# Patient Record
Sex: Female | Born: 1940
Health system: Southern US, Community
[De-identification: ages and names within clinical notes are randomized; demographics above are authoritative.]

## PROBLEM LIST (undated history)

## (undated) DIAGNOSIS — M1990 Unspecified osteoarthritis, unspecified site: Secondary | ICD-10-CM

## (undated) DIAGNOSIS — Z9289 Personal history of other medical treatment: Secondary | ICD-10-CM

## (undated) DIAGNOSIS — K859 Acute pancreatitis without necrosis or infection, unspecified: Secondary | ICD-10-CM

## (undated) DIAGNOSIS — I10 Essential (primary) hypertension: Secondary | ICD-10-CM

## (undated) DIAGNOSIS — I4891 Unspecified atrial fibrillation: Secondary | ICD-10-CM

## (undated) DIAGNOSIS — I6529 Occlusion and stenosis of unspecified carotid artery: Secondary | ICD-10-CM

## (undated) DIAGNOSIS — I350 Nonrheumatic aortic (valve) stenosis: Secondary | ICD-10-CM

## (undated) DIAGNOSIS — I2510 Atherosclerotic heart disease of native coronary artery without angina pectoris: Secondary | ICD-10-CM

## (undated) DIAGNOSIS — C9110 Chronic lymphocytic leukemia of B-cell type not having achieved remission: Secondary | ICD-10-CM

## (undated) DIAGNOSIS — I739 Peripheral vascular disease, unspecified: Secondary | ICD-10-CM

## (undated) DIAGNOSIS — D696 Thrombocytopenia, unspecified: Principal | ICD-10-CM

## (undated) HISTORY — DX: Occlusion and stenosis of unspecified carotid artery: I65.29

## (undated) HISTORY — DX: Personal history of other medical treatment: Z92.89

## (undated) HISTORY — DX: Atherosclerotic heart disease of native coronary artery without angina pectoris: I25.10

## (undated) HISTORY — DX: Peripheral vascular disease, unspecified: I73.9

## (undated) HISTORY — DX: Chronic lymphocytic leukemia of B-cell type not having achieved remission: C91.10

## (undated) HISTORY — PX: CHOLECYSTECTOMY: SHX55

## (undated) HISTORY — PX: TONSILLECTOMY: SUR1361

## (undated) HISTORY — DX: Essential (primary) hypertension: I10

## (undated) HISTORY — DX: Unspecified atrial fibrillation: I48.91

## (undated) HISTORY — PX: APPENDECTOMY: SHX54

## (undated) HISTORY — PX: ABDOMINAL HYSTERECTOMY: SHX81

## (undated) HISTORY — DX: Thrombocytopenia, unspecified: D69.6

## (undated) HISTORY — DX: Nonrheumatic aortic (valve) stenosis: I35.0

---

## 2000-02-13 ENCOUNTER — Encounter: Payer: Self-pay | Admitting: Emergency Medicine

## 2000-02-13 ENCOUNTER — Encounter (INDEPENDENT_AMBULATORY_CARE_PROVIDER_SITE_OTHER): Payer: Self-pay

## 2000-02-13 ENCOUNTER — Inpatient Hospital Stay (HOSPITAL_COMMUNITY): Admission: EM | Admit: 2000-02-13 | Discharge: 2000-02-21 | Payer: Self-pay | Admitting: Emergency Medicine

## 2000-02-14 ENCOUNTER — Encounter: Payer: Self-pay | Admitting: Internal Medicine

## 2000-02-17 ENCOUNTER — Encounter: Payer: Self-pay | Admitting: General Surgery

## 2000-02-19 ENCOUNTER — Encounter: Payer: Self-pay | Admitting: General Surgery

## 2000-02-20 ENCOUNTER — Encounter: Payer: Self-pay | Admitting: Cardiology

## 2006-08-24 ENCOUNTER — Ambulatory Visit: Payer: Self-pay | Admitting: Hematology and Oncology

## 2006-09-16 ENCOUNTER — Other Ambulatory Visit: Admission: RE | Admit: 2006-09-16 | Discharge: 2006-09-16 | Payer: Self-pay | Admitting: Hematology and Oncology

## 2006-09-16 ENCOUNTER — Encounter: Payer: Self-pay | Admitting: Hematology and Oncology

## 2006-09-16 LAB — CBC WITH DIFFERENTIAL/PLATELET
Basophils Absolute: 0 10*3/uL (ref 0.0–0.1)
Eosinophils Absolute: 0.3 10*3/uL (ref 0.0–0.5)
HGB: 13.2 g/dL (ref 11.6–15.9)
LYMPH%: 71.8 % — ABNORMAL HIGH (ref 14.0–48.0)
MCV: 97.4 fL (ref 81.0–101.0)
MONO%: 3.7 % (ref 0.0–13.0)
NEUT#: 2.2 10*3/uL (ref 1.5–6.5)
NEUT%: 21.8 % — ABNORMAL LOW (ref 39.6–76.8)
Platelets: 121 10*3/uL — ABNORMAL LOW (ref 145–400)
RBC: 3.8 10*6/uL (ref 3.70–5.32)

## 2006-09-18 LAB — COMPREHENSIVE METABOLIC PANEL
BUN: 12 mg/dL (ref 6–23)
CO2: 30 mEq/L (ref 19–32)
Calcium: 10.4 mg/dL (ref 8.4–10.5)
Chloride: 96 mEq/L (ref 96–112)
Creatinine, Ser: 0.77 mg/dL (ref 0.40–1.20)
Glucose, Bld: 117 mg/dL — ABNORMAL HIGH (ref 70–99)

## 2006-09-18 LAB — PROTEIN ELECTROPHORESIS, SERUM
Alpha-2-Globulin: 13.1 % — ABNORMAL HIGH (ref 7.1–11.8)
Beta 2: 3.5 % (ref 3.2–6.5)
Beta Globulin: 5.9 % (ref 4.7–7.2)
Gamma Globulin: 8.3 % — ABNORMAL LOW (ref 11.1–18.8)

## 2010-01-28 ENCOUNTER — Encounter: Payer: Self-pay | Admitting: Hematology and Oncology

## 2010-05-20 ENCOUNTER — Other Ambulatory Visit: Payer: Self-pay | Admitting: Dermatology

## 2010-05-24 NOTE — Consult Note (Signed)
[place]  Patient:    Nicole Mercado, KOCHAN                          MRN: [id number] Proc. Date: 02/14/00 Adm. Date:  [id number] Attending:  Drema Halon CC:         Vale Haven. Andrey Campanile, M.D.  Hedwig Morton. Juanda Chance, M.D. [facility]   Consultation Report  REASON FOR CONSULTATION:  Gallstones and pancreatitis.  HISTORY OF PRESENT ILLNESS:  This is a 70 year old white female who was in her usual state of good health until yesterday at 5 oclock p.m.  At that time she had rather acute onset of epigastric pain, left upper quadrant pain, right upper quadrant pain, and back pain.  The pain was progressive.  She became nauseated and vomited at least three times.  She was having heartburn and took Tums, but without relief.  She ultimately came to the [facility] and was evaluated and found to have acute pancreatitis as well as gallstones.  She was seen by Dr. Karma Ganja and was admitted for management.  She has been on IV fluids and n.p.o. and protime pump inhibitors over the period of time.  She states that her pain is better today, but has not completely resolved.  She has not had any prior episodes like this.  No prior pancreatic or biliary tract problems.  No history of liver disease or hepatitis.  She does drink about three drinks a day, sometimes beer, wine, sometimes mixed drinks.  She is a nonsmoker.  I was asked to see her because of the need for ultimate cholecystectomy.  PAST SURGICAL HISTORY:  She has had four cesarean sections.  She has had a total abdominal hysterectomy and bilateral salpingo-oophorectomy.  She has had appendectomy, tonsillectomy at age 67.  PAST MEDICAL HISTORY:  Hypertension, postmenopausal.  CURRENT MEDICATIONS:  Accupril, estrogen patch, vitamin E.  ALLERGIES:  None known.  FAMILY HISTORY:  Mother and father died of heart attacks.  Her mother had a cholecystectomy.  SOCIAL HISTORY:  The patient is widowed,  lives alone.  Drinks about three drinks a day.  Denies tobacco.  PHYSICAL EXAMINATION:  GENERAL:  Well-developed, well-nourished white female in no distress.  VITAL SIGNS:  Afebrile.  Heart rate 82, respirations 18, normotensive.  HEENT:  Sclerae are clear.  Extraocular movements are intact.  Oropharynx: Clear.  NECK:  Supple, nontender.  No mass.  No adenopathy.  No bruit.  LUNGS:  Clear to auscultation.  HEART:  Regular rate and rhythm.  No murmur.  ABDOMEN:  Not distended.  Bowel sounds are hypoactive.  She is tender in the epigastrium, left upper quadrant, and right upper quadrant but she has no peritoneal signs.  Minimal guarding.  No rebound.  No abdominal mass.  EXTREMITIES:  No edema.  Good pulses.  NEUROLOGIC:  Grossly within normal limits.  LABORATORIES:  Hemoglobin 16.0, repeat 15.3, white blood cell count on admission 14.8 and has fallen to 12.3.  Liver enzymes were up with SGOT 630, SGPT 414 with total bilirubin 1.3.  On admission serum lipase was 5931; today it is 3414.  Ultrasound shows numerous gallstones, a thickened gallbladder wall with some pericholecystic fluid.  There is an echogenic focus thought to be in the common bile duct suggesting the possibility of a common bile duct stone but the common bile duct is not dilated.  CT scan shows moderate severe pancreatitis, specifically, the pancreas diffusely enlarged  and edematous. There are gallstones noted.  There is no free air or extraluminal air.  No abscess noted.  IMPRESSION: 1. Moderately severe acute pancreatitis.  The pancreatitis is edematous but it    is clinically improving.  Likely etiology is gallstones but I can not    exclude alcohol as a possible cause. 2. Chronic (daily) alcohol use. 3. Hypertension. 4. Status post total abdominal hysterectomy and bilateral    salpingo-oophorectomy. 5. Status post multiple cesarean sections.  PLAN: 1. As you are doing, I agree with bowel rest, n.p.o.  status, protime pump    inhibitors, careful attention to hydration. 2. We will repeat all of her laboratory work tomorrow to make sure that her    liver function tests are normalizing.  If her liver function tests go up    and/or her symptoms recur, emergent ERCP will have to be considered. 3. Assuming her pancreatitis cools off uneventfully, then cholecystectomy this    admission would be recommended. DD:  02/14/00 TD:  02/16/00 Job: [id number]

## 2010-05-24 NOTE — Op Note (Signed)
[facility]  Patient:    Nicole Mercado, Nicole Mercado                          MRN: [id number] Proc. Date: 02/17/00 Adm. Date:  [id number] Attending:  Drema Halon CC:         Vale Haven. Andrey Campanile, M.D.  Hedwig Morton. Juanda Chance, M.D. [facility]   Operative Report  PREOPERATIVE DIAGNOSES:  Acute biliary pancreatitis, cholelithiasis, possible choledocholithiasis.  POSTOPERATIVE DIAGNOSES:  Biliary pancreatitis, cholelithiasis, choledocholithiasis.  OPERATION PERFORMED:  Laparoscopic cholecystectomy with intraoperative cholangiogram.  SURGEON:  Dr. Claud Kelp.  FIRST ASSISTANT:  Dr. Francina Ames.  INDICATIONS FOR PROCEDURE:  This is a 70 year old white female who had the rather sudden onset of upper abdominal pain on February 7. This was associated with vomiting. She was evaluated in the emergency room and found to have gallstones on ultrasound. She was also found to have markedly elevated serum amylase and serum lipase. There was also a question of an echogenic focus in the common bile duct on ultrasound. CT scan showed moderately severe edematous pancreatitis. The gallbladder did not appear acutely inflamed and the common bile duct was not dilated. She was hospitalized and over the past 72 hours has become asymptomatic. Her abdominal pain is resolved and her abdominal tenderness has resolved. Her liver function tests which were somewhat elevated have almost completely normalized. She is brought to the operating room for cholecystectomy with cholangiogram.  FINDINGS:  The gallbladder was somewhat edematous but this appeared to be more of a secondary inflammation due to her pancreatitis. The pancreas was clearly swollen pushing the stomach anteriorly. There was some green fluid below the liver and above the liver. The small intestine and large intestine looked normal to inspection. The stomach and duodenum looked fine although the duodenum was a little bit adherent to  the infundibulum of the gallbladder. The cholangiogram showed three small distal common bile duct stones in the distal common bile duct. There was no obstruction with food flow of contrast in the duodenum. The bowel duct was not dilated. The liver appeared healthy otherwise.  TECHNIQUE:  Following the induction of general endotracheal anesthesia, the patients abdomen was prepped and draped in a sterile fashion. A 0.5% Marcaine with epinephrine was used as local infiltration anesthetic. A vertical incision was made at the upper rim of the umbilicus. The fascia was incised in the midline and the abdominal cavity entered under direct vision. A 10 mm Hasson trocar was inserted and secured with a pursestring suture of #0 Vicryl. Pneumoperitoneum was created. A video camera was inserted with visualization and findings as described above. A 10 mm trocar was placed in the subxiphoid region and two 5 mm trocars placed in the right mid abdomen.  The gallbladder was elevated. We took some adhesions down, gently dissected the duodenum away from the infundibulum of the gallbladder. There was acute edema in this area. We dissected out the cystic duct and cystic artery. We actually dissected out the anterior branch and the posterior branch of the cystic artery separately. These anterior and posterior branches were secured with metal clips and divided as they went onto the gallbladder wall. We created a nice window behind the cystic duct. We secured the cystic duct with the metal clip close to the gallbladder. A cholangiogram catheter was inserted into the cystic duct and a cholangiogram was obtained using the C-arm. The patient had what appeared to be three distal common bile duct stones  each 5 mm in diameter or less. These were all in the very distal common bile duct. The bile duct was not dilated and there was no obstruction and there was good flow of contrast into the duodenum. We also visualized the  intrahepatic ducts and the common hepatic duct quite nicely.  The cholangiogram catheter was removed. The cystic duct was secured with metal clips and divided. The gallbladder was dissected from its bed with electrocautery and removed through the umbilical port. The operative field was copiously irrigated. At the completion of the case, there was no bleeding and no bile leak whatsoever. The irrigation fluid returned quite clear. The trocars were removed under direct vision and there was no bleeding from the trocar sites. Pneumoperitoneum was release. The fascia and the umbilicus was closed with #0 Vicryl sutures. The skin incisions were closed with subcuticular sutures of 4-0 Vicryl and Steri-Strips. Clean bandages were placed and the patient taken to the recovery room in stable condition. Estimated blood loss was about 20 cc, complications none. Sponge, needle and instrument counts were correct. DD:  02/17/00 TD:  02/18/00 Job: [id number]

## 2010-05-24 NOTE — H&P (Signed)
[facility]  Patient:    Nicole Mercado, Nicole Mercado                          MRN: [id number] Adm. Date:  [id number] Attending:  Pamelia Hoit                         History and Physical  HISTORY OF PRESENT ILLNESS:  This 70 year old white female was well until 3 p.m. on the afternoon of admission when she had acute severe epigastric pain radiating to her back associated with nausea.  She did not throw up, nor have stool change.  She presented to the emergency room at [facility] where she was found to have a lipase of over 5000, a blood sugar of 169, and a potassium of 3.3 with elevated liver function tests.  Her CT scan showed severe pancreatitis with no definite gallstones, but perhaps some gallbladder sludge.  She was admitted for treatment and consultation.  ALLERGIES:  No known drug allergies.  MEDICATIONS:  Blood pressure medication, question Accupril.  PAST SURGICAL HISTORY: 1. Tonsillectomy and adenoidectomy. 2. Appendectomy. 3. Gravida 5, para 4, AB 1, with cesarean section x3, and an abdominal    hysterectomy for fibroids.  FAMILY HISTORY:  Father died at 29 of myocardial infarction.  Mother died at 67 of myocardial infarction.  One sister is alive and well.  Her mother did have gallstones.  SOCIAL HISTORY:  No tobacco.  The patient drinks one beer per day, probably 12 ounces or more of wine per day, and at least 2 ounces of liquor per day.  She is not working, and does not currently apparently have insurance.  She sees Dr. Katrinka Blazing in [place].  REVIEW OF SYSTEMS:  Really no significant past medical history except for high blood pressure for 4 years which is asymptomatic and apparently treated.  She has no history of high cholesterol or blood sugar problems.  She has had two bladder infections in her life.  We will again review this tomorrow.  PHYSICAL EXAMINATION:  VITAL SIGNS:  Temperature 97, pulse 72, respirations 16, blood pressure  154/78 without drop.  SKIN:  Cool and dry.  Good turgor.  No rash or edema.  HEENT:  Moist tongue.  Nose is clear.  Eyes are negative for scleral icterus.  NECK:  No neck masses or bruits.  CARDIORESPIRATORY:  Clear to auscultation and percussion.  No rales, rhonchi, or rubs.  ABDOMEN:  Soft, nontender, I do not palpate mass or liver enlargement.  She is slightly tender actually in the epigastrium.  No rebound.  RECTAL:  Guaiac negative stool.  NEUROMUSCULOSKELETAL:  No abnormalities.  The patient is alert, conversant, and appropriate.  IMPRESSION ON ADMISSION: 1. Acute pancreatitis, probably secondary to alcohol, rule out biliary    disease. 2. History of hypertension.  PLAN:  IV fluids, pain medications, GI consult in the a.m. DD:  02/13/00 TD:  02/15/00 Job: [id number]

## 2010-05-24 NOTE — Op Note (Signed)
[facility]  Patient:    Nicole Mercado, Nicole Mercado                          MRN: [id number] Proc. Date: 02/19/00 Adm. Date:  [id number] Attending:  Drema Halon CC:         Vale Haven. Andrey Campanile, M.D.  Angelia Mould. Derrell Lolling, M.D.   Operative Report  PROCEDURE:  Endoscopic retrograde cholangiopancreatography with biliary sphincterotomy and common bile duct stone extraction.  INDICATIONS FOR PROCEDURE:  Retained common bile duct stones post laparoscopic cholecystectomy.  HISTORY OF PRESENT ILLNESS:  This is a 70 year old female with a history of hypertension who was admitted to the hospital February 13, 2000 with gallstone pancreatitis. Two days ago, she underwent laparoscopic cholecystectomy. Intraoperative cholangiogram revealed multiple small retained stones. She is now for ERCP with sphincterotomy and common duct stone extraction. The nature of the procedure as well as the risks, benefits, and alternatives were discussed in detail. She understood and agreed to proceed.  PHYSICAL EXAMINATION:  GENERAL:  Well appearing female in no acute distress. She is alert and oriented.  VITAL SIGNS:  Remarkable for normal blood pressure at 120/62 with heart rate of 120 which is irregularly irregular.  LUNGS:  Clear.  HEART:  Irregularly irregular.  ABDOMEN:  Soft and nontender.  DESCRIPTION OF PROCEDURE:  After informed consent was obtained, the patient was sedated with 50 mg of Demerol and 5 mg of Versed IV. Glucagon 0.5 mg IV was given as a duodenal relaxant. Ciprofloxacin IV was continued preprocedurally. The Olympus therapeutic side viewing endoscope was passed blindly into the esophagus. The stomach revealed evidence of nasogastric tube trauma but was otherwise normal. The duodenal bulb and post bulbar duodenum were normal. The major ampulla was normal. The minor ampulla was not sought.  X-RAY FINDINGS: 1. Scout radiograph of the abdomen with the endoscope  in position revealed    surgical clips in the gallbladder fossa. No other abnormalities. 2. There was minimal filling of the pancreatic duct. 3. Complete filling of the biliary system revealed normal caliber biliary    tree post cholecystectomy. Multiple small stones were noted. These measured    3-4 mm. Approximately 4 stones were noted.  THERAPY:  With a guidewire placed in the proximal biliary tree, a large sphincterotomy was made with cutting in the 12 oclock orientation. Cutting was over the guidewire utilizing the ERBE system. The cutting cannula was exchanged for an 8 mm balloon. Three small cholesterol type stones were extracted. One small stone measuring approximately 3 mm was located in the left intrahepatic ductal system. Wire was passed beyond this and a balloon as well. Several attempts were made to extract the stone. These were unsuccessful. This was not pursued vigorously as the small stone was felt to easily pass through the sphincterotomy. Post procedure drainage of the biliary system was excellent.  IMPRESSION: 1. Choledocholithiasis status post ERCP with common duct stone extraction. 2. One small retained stone in the left intrahepatic ductal system. 3. New onset atrial fibrillation/flutter.  RECOMMENDATIONS: 1. Continue antibiotics. 2. Advance diet as tolerated. 3. The patient will be transferred to telemetry. A cardiology consultation    has been called to evaluate her supraventricular tachycardia. DD:  02/19/00 TD:  02/19/00 Job: [id number]

## 2010-05-24 NOTE — Discharge Summary (Signed)
[facility]  Patient:    Nicole Mercado, Nicole Mercado                          MRN: [id number] Adm. Date:  [id number] Disc. Date: [id number] Attending:  Brandy Hale CC:         Vale Haven. Andrey Campanile, M.D.  Hedwig Morton. Juanda Chance, M.D. [facility]  John N. Eda Keys., M.D. [facility]  Luis Abed, M.D. [facility]   Discharge Summary  FINAL DIAGNOSES:  1. Acute biliary pancreatitis, resolved.  2. Chronic cholecystitis with cholelithiasis.  3. Choledocholithiasis.  4. Paroxysmal atrial fibrillation, resolved.  5. Hypertension.  6. Status post total abdominal hysterectomy and bilateral     salpingo-oophorectomy.  OPERATIONS PERFORMED:  1. Laparoscopic cholecystectomy with intraoperative cholangiogram, February 17, 2000.  2. Endoscopic retrograde cholangiopancreatography with endoscopic     sphincterotomy and common duct stone removal, February 19, 2000.  HISTORY OF PRESENT ILLNESS:  This is a 70 year old white female who was in good health until 5:00 p.m. the day prior to admission. She developed acute onset of epigastric pain and left upper quadrant pain and right upper quadrant pain and back pain at that time. She became nauseated and vomited repeatedly. She was also having reflux symptoms. She came to the emergency room and was admitted by Dr. Margrett Rud for management of what appeared to be biliary pancreatitis. She had not had any prior episodes of similar pain. No prior history of liver disease or hepatitis. She does drink about three drinks a day, sometimes beer, sometimes wine and sometimes mixed drinks but has never had any apparent complication of her daily drinking.  PHYSICAL EXAMINATION:  GENERAL:  On the day of admission, she was found to be in mild distress.  VITAL SIGNS:  Temperature 97.0, pulse 72, respirations 16, blood pressure 154/78.  SKIN:  Cool and dry, good turgor. No edema.  HEENT:  Sclera clear.  NECK:  No mass or bruit.  HEART:  Regular  rate and rhythm.  LUNGS:  Clear to auscultation.  ABDOMEN:  Soft, minimally tender in the epigastrium, no distention, no rebound, no mass.  RECTAL:  Hemoccult negative stool.  ADMISSION DATA:  CT scan showed edematous pancreatitis. Blood work showed a lipase of over 5000. Hemoglobin 16.0, white count was 14,800. SGOT elevated at 630, SGPT 414, total bilirubin 1.3. Ultrasound showed numerous gallstones, a thickened gallbladder wall with some fluid. There was also an echogenic focus thought to be in the common bile duct suggesting the possibility of common bile duct stone but the common bile duct was not dilated.  HOSPITAL COURSE:  The patient was admitted by Dr. Margrett Rud and put at bowel rest. IV rehydration and analgesics. She was seen in consultation by Dr. Lina Sar as well as me on February 8. We both agreed that the patient certainly had biliary pancreatitis and although she did drink daily that the most likely cause of her pancreatitis was her gallstones and that ultimately she would need a cholecystectomy. We discussed whether to go ahead with ERCP at this time or to wait and since the patient seemed to be rapidly improving, we decided to wait. Over the next couple of days, her pain resolved and her liver enzymes almost completely normalized. We decided to forgo the ERCP thinking that the patient possibly may have passed her common bile duct stones. On February 11, her SGPT was down to 61 and her SGOT and alkaline  phosphatase were normal and we elected to go ahead with laparoscopic cholecystectomy.  On February 11, laparoscopic cholecystectomy was performed. She had an edematous gallbladder full of gallstones. Cholangiogram showed three small distal common bile duct stones. We completed the operation uneventfully. Dr. Yancey Flemings was asked to see her and he made plans for ERCP on February 12.  The patient did well over the next 24 to 48 hours, had no problems,  was comfortable, had minimal pain, had a low-grade fever but was able to get up and around and was voiding uneventfully.  ERCP was performed on February 19, 2000. Sphincterotomy was performed and Dr. Marina Goodell was able to get several common bile duct stones out. During the procedure, the patient went into rapid atrial fibrillation flutter and that persisted. Dr. Willa Rough was asked to see the patient. The patient spontaneously converted back to sinus rhythm. It was Dr. Cheron Every feeling that she should be started on beta blockers and she was started on Toprol. Cardiac enzymes were negative. An echocardiogram was performed and the report of the echocardiogram is pending at this time. Nevertheless, she did not have any chest pain and converted to normal sinus rhythm spontaneously and was placed on beta blockers.  The patient did well thereafter advancing in her diet and activities. She was discharged on February 15. At that time, she was afebrile tolerating a regular diet with no nausea. She was in normal sinus rhythm. Chest x-ray showed small pleural effusions felt to be probably secondary to pancreatitis and I felt these would be self limited.  DISCHARGE MEDICATIONS:  Vicodin for pain. Enteric coated aspirin one per day. Toprol XL one tablet daily. She was to hold her other antihypertensive medication until she followed up with Dr. Myrtis Ser in the office.  She was to return to the office to see me in two to three weeks. She was to follow-up with Dr. Willa Rough in two weeks regarding her hypertension and paroxysmal atrial fibrillation. She was to follow-up with Dr. Margrett Rud on a routine basis. DD:  03/02/00 TD:  03/02/00 Job: [id number]

## 2010-11-04 ENCOUNTER — Other Ambulatory Visit: Payer: Self-pay | Admitting: Family Medicine

## 2010-11-04 DIAGNOSIS — R221 Localized swelling, mass and lump, neck: Secondary | ICD-10-CM

## 2010-11-05 ENCOUNTER — Ambulatory Visit
Admission: RE | Admit: 2010-11-05 | Discharge: 2010-11-05 | Disposition: A | Discharge status: Home / Self Care | Payer: Medicare Other | Source: Ambulatory Visit | Attending: Family Medicine | Admitting: Family Medicine

## 2010-11-05 DIAGNOSIS — R221 Localized swelling, mass and lump, neck: Secondary | ICD-10-CM

## 2010-11-05 IMAGING — CT CT NECK W/ CM
4 of 6 series · 15 of 33 positions shown, 17 images · IV contrast (75CC OMNI 300)
Comparison: None.

CLINICAL DATA: 69-year-old female with left neck swelling, palpable
abnormality.

CT NECK WITH CONTRAST
TECHNIQUE: Multidetector CT imaging of the neck was performed with
intravenous contrast.
Contrast:  75 ml [GZ].

[Series 2: axial neck · axial · 0.35mm/px · z∈[+100,+235]mm · 4 of 90 slices shown, 5 images]
[im 18/90  soft-tissue]
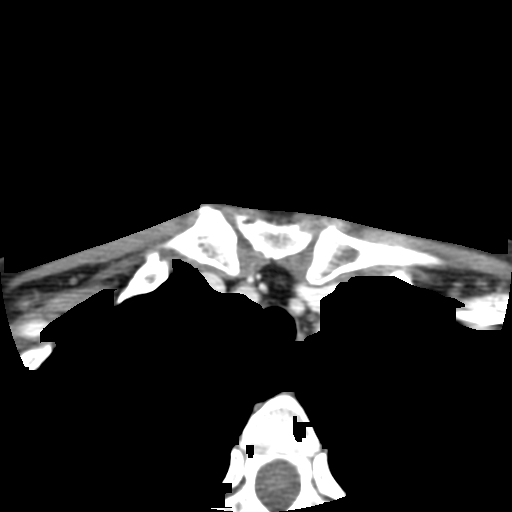
[im 18/90  bone]
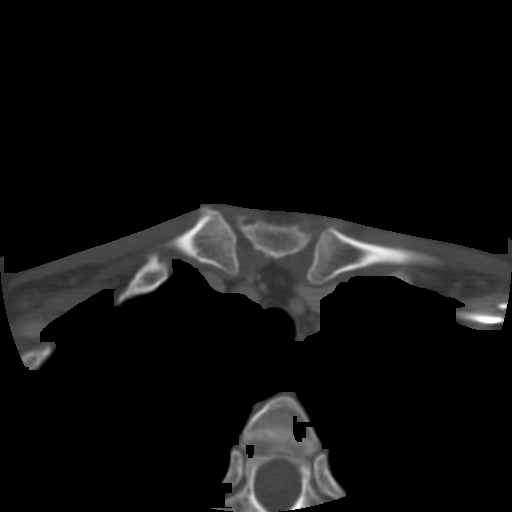
[im 36/90  bone]
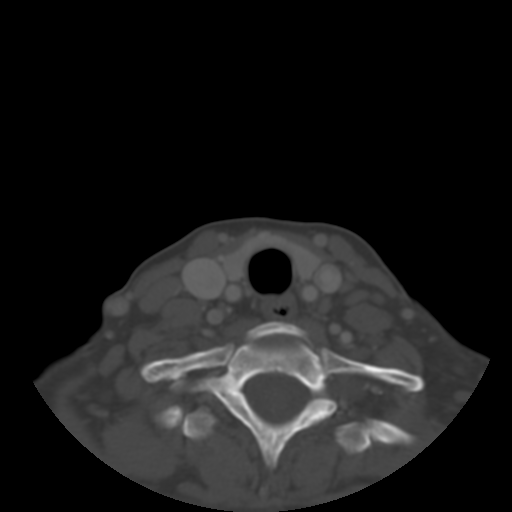
[im 54/90  bone]
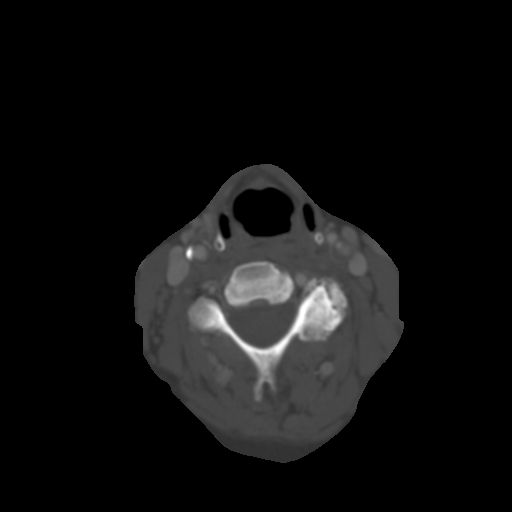
[im 72/90  bone]
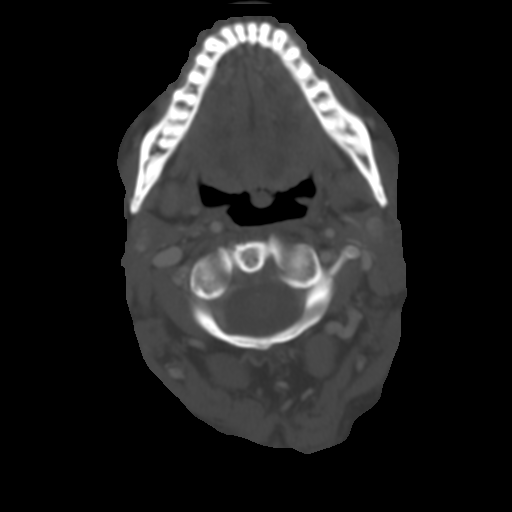

[Series 400: coronal · coronal · 0.45mm/px · 3 of 70 slices shown]
[im 14/70  bone]
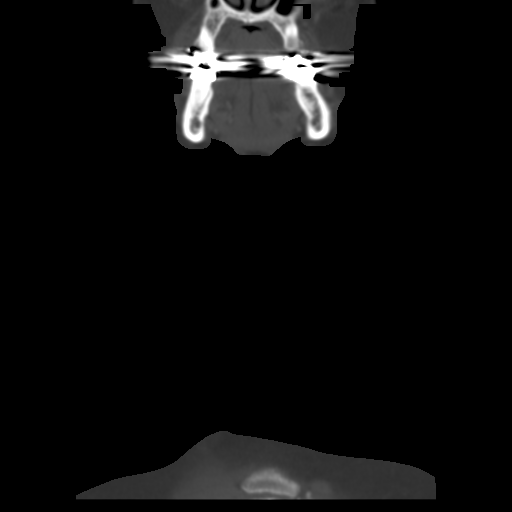
[im 28/70  bone]
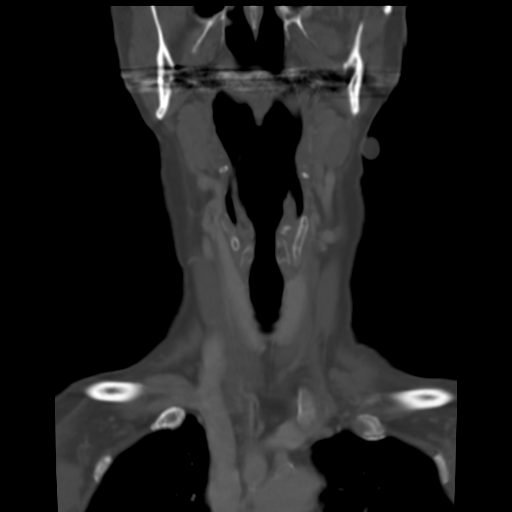
[im 42/70  bone]
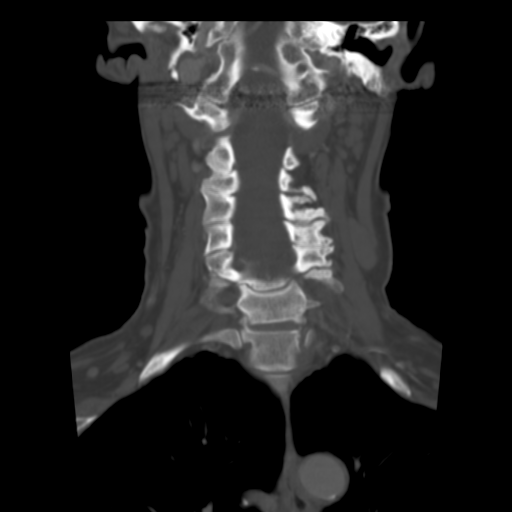

[Series 401: sagittal · sagittal · 0.45mm/px · 5 of 83 slices shown, 6 images]
[im 28/83  bone]
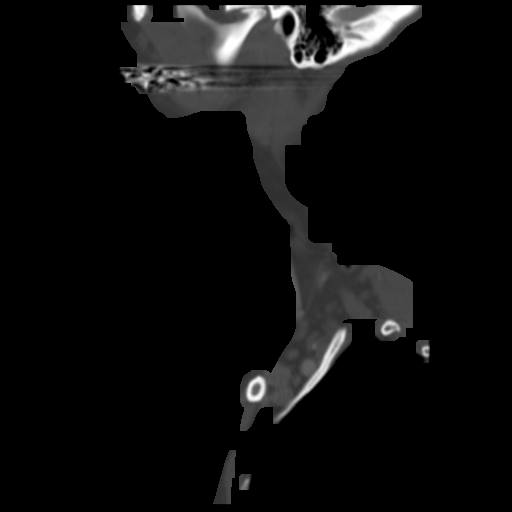
[im 35/83  bone]
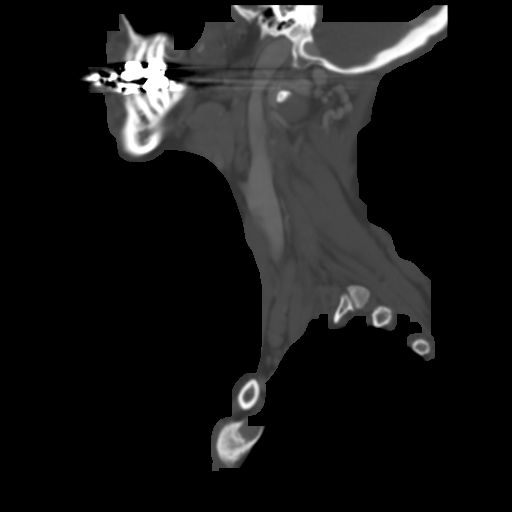
[im 42/83  soft-tissue]
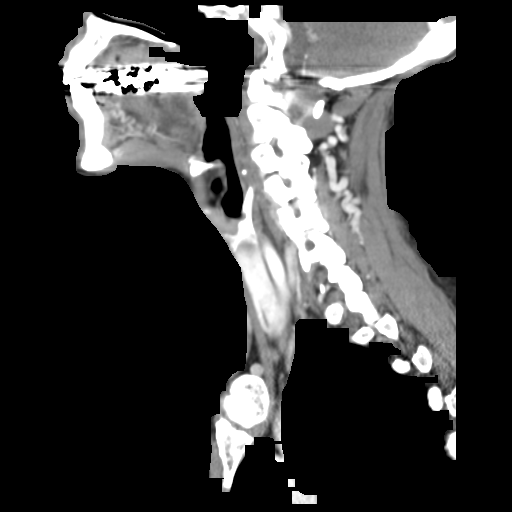
[im 42/83  bone]
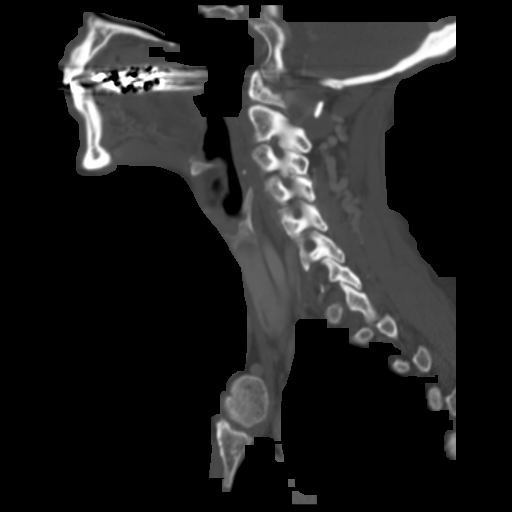
[im 48/83  bone]
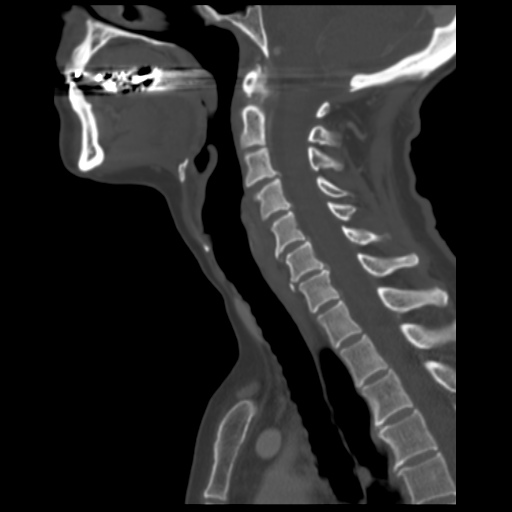
[im 55/83  bone]
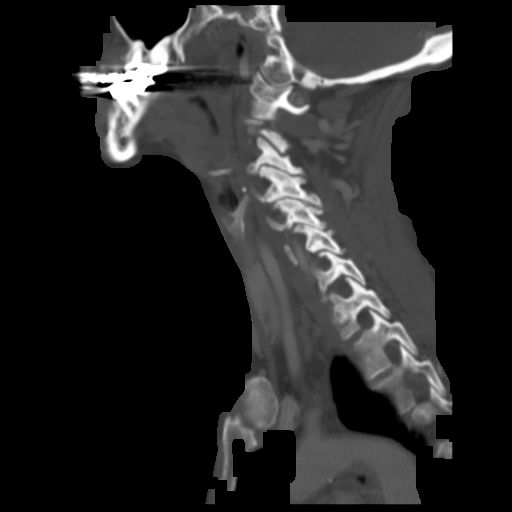

[Series 402: angled axial · axial · 0.45mm/px · z∈[+79,+175]mm · 3 of 83 slices shown]
[im 21/83  bone]
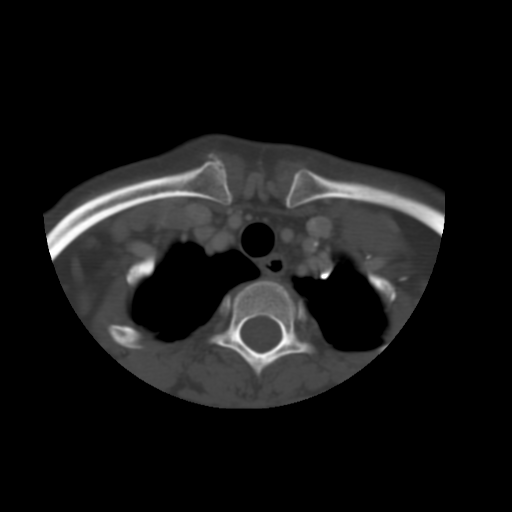
[im 42/83  bone]
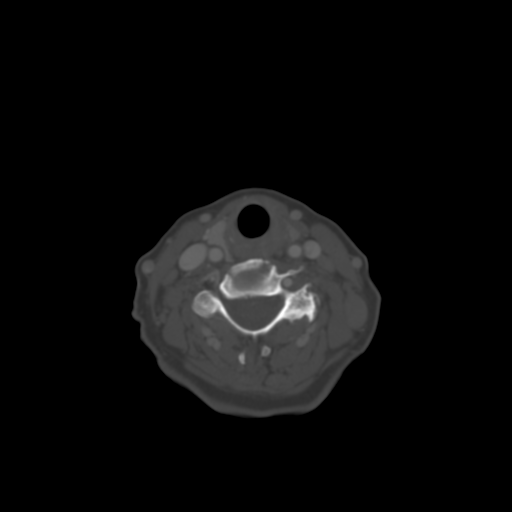
[im 62/83  bone]
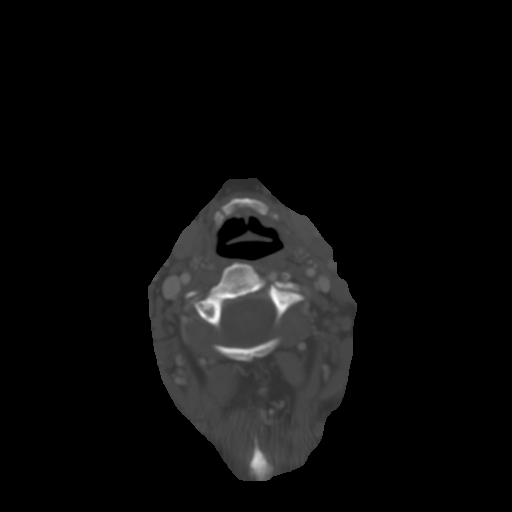

[15 of 33 positions shown; findings below may reference images not displayed]

FINDINGS: Cervical lymph nodes are increased in number throughout
the neck.  A skin marker is in place at the level of the thyroid
cartilage laterally on the left neck (series 2 image 40) and just
posterior to the marker there is an abnormally enlarged left level
IIIB node measuring 11 x 18 x 22 mm.  The second marker is at the
left submandibular level where a soft tissue mass thought to be an
abnormal left level IB node is indenting the lateral aspect of the
left submandibular gland measuring 11 x 15 x 14 mm.

In addition, there is left supraclavicular adenopathy at level IV
with individual nodes measuring up to 16 mm in short axis (16 x 18
x 32 mm overall).  Right level IV nodes are increased in size and
number measuring up to 7 mm in short axis.  Right level III nodes
are increased in size and number measuring up to 9 mm in short
axis.

A small nodes in the visualized superior mediastinum appear within
normal limits.  There do appear to be an increased number of
axillary nodes partially visible (series 3 image 20).

Negative thyroid.  Negative larynx, pharyngeal soft tissues,
parapharyngeal spaces, retropharyngeal space, sublingual space,
right submandibular gland, and bilateral parotid glands.

Visualized major vascular structures are patent; the left vertebral
artery is dominant.  There is coarse circumferential left carotid
calcified atherosclerosis with suspected hemodynamically
significant stenosis.

Negative visualized brain parenchyma. Visualized paranasal sinuses
and mastoids are clear.  Degenerative changes in the cervical
spine, especially facet arthropathy on the left.  No suspicious
osseous lesion.  Negative lung apices.
IMPRESSION: 1.  Generalized bilateral cervical and supraclavicular
lymphadenopathy, maximal on the left at level III and level IV.
Appearance most compatible with lymphoma/leukemia.  Recommend
histologic correlation.
2.  Left carotid calcified atherosclerosis with suspected
hemodynamically significant stenosis.

## 2010-11-05 MED ORDER — IOHEXOL 300 MG/ML  SOLN: 75.0000 mL | Freq: Once | INTRAMUSCULAR | Status: AC | PRN

## 2010-11-08 ENCOUNTER — Other Ambulatory Visit (HOSPITAL_COMMUNITY): Payer: Self-pay | Admitting: Family Medicine

## 2010-11-08 ENCOUNTER — Other Ambulatory Visit (HOSPITAL_COMMUNITY): Payer: Self-pay | Admitting: Radiology

## 2010-11-08 DIAGNOSIS — C8591 Non-Hodgkin lymphoma, unspecified, lymph nodes of head, face, and neck: Secondary | ICD-10-CM

## 2010-11-11 ENCOUNTER — Ambulatory Visit (HOSPITAL_COMMUNITY)
Admission: RE | Admit: 2010-11-11 | Discharge: 2010-11-11 | Disposition: A | Discharge status: Home / Self Care | Payer: Medicare Other | Source: Ambulatory Visit | Attending: Family Medicine | Admitting: Family Medicine

## 2010-11-11 ENCOUNTER — Other Ambulatory Visit: Payer: Self-pay | Admitting: Diagnostic Radiology

## 2010-11-11 ENCOUNTER — Other Ambulatory Visit: Payer: Self-pay | Admitting: Physician Assistant

## 2010-11-11 DIAGNOSIS — C8591 Non-Hodgkin lymphoma, unspecified, lymph nodes of head, face, and neck: Secondary | ICD-10-CM

## 2010-11-11 DIAGNOSIS — D1739 Benign lipomatous neoplasm of skin and subcutaneous tissue of other sites: Secondary | ICD-10-CM | POA: Insufficient documentation

## 2010-11-11 DIAGNOSIS — R221 Localized swelling, mass and lump, neck: Secondary | ICD-10-CM | POA: Insufficient documentation

## 2010-11-11 DIAGNOSIS — R599 Enlarged lymph nodes, unspecified: Secondary | ICD-10-CM | POA: Insufficient documentation

## 2010-11-11 DIAGNOSIS — R220 Localized swelling, mass and lump, head: Secondary | ICD-10-CM | POA: Insufficient documentation

## 2010-11-11 DIAGNOSIS — R590 Localized enlarged lymph nodes: Secondary | ICD-10-CM

## 2010-11-11 DIAGNOSIS — R591 Generalized enlarged lymph nodes: Secondary | ICD-10-CM | POA: Insufficient documentation

## 2010-11-11 LAB — PROTIME-INR: Prothrombin Time: 13.7 seconds (ref 11.6–15.2)

## 2010-11-11 LAB — CBC
MCH: 33.1 pg (ref 26.0–34.0)
MCV: 97.7 fL (ref 78.0–100.0)
Platelets: 90 10*3/uL — ABNORMAL LOW (ref 150–400)
RBC: 3.47 MIL/uL — ABNORMAL LOW (ref 3.87–5.11)
RDW: 13.7 % (ref 11.5–15.5)

## 2010-11-11 IMAGING — US US BIOPSY
1 series · 13 of 14 positions shown · non-contrast
Comparison: none

CLINICAL HISTORY: 69-year-old with cervical lymphadenopathy.

[Series 1: us biopsy · 0.07mm/px · 13 of 14 slices shown]
[im 1/14]
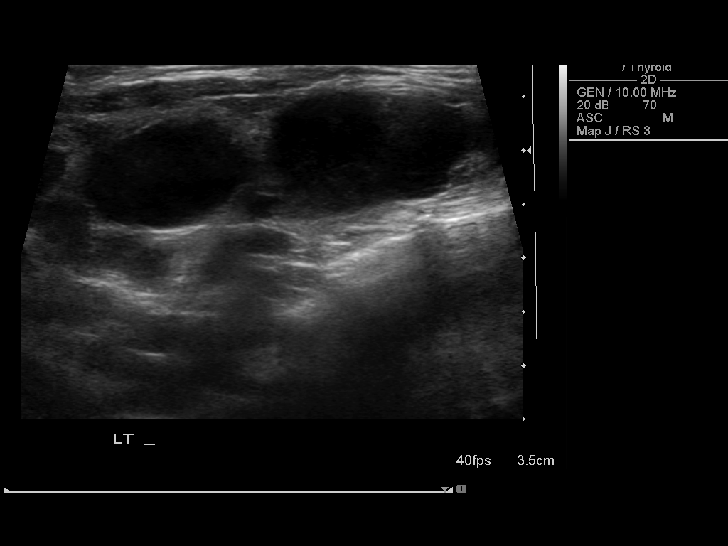
[im 2/14]
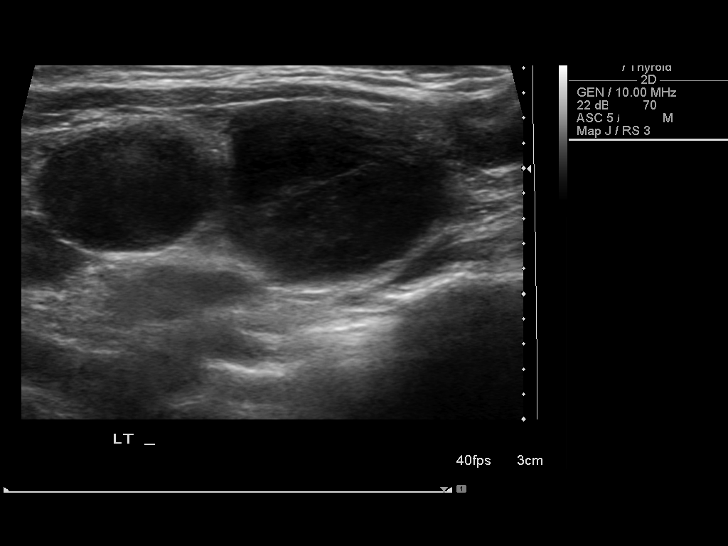
[im 3/14]
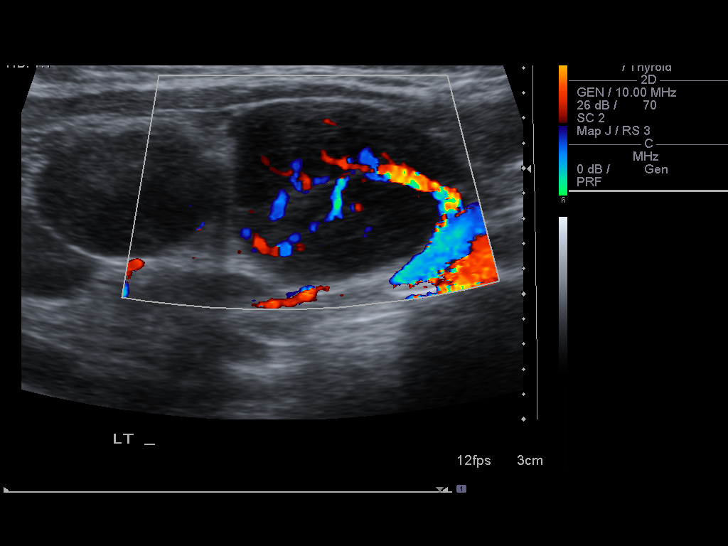
[im 4/14]
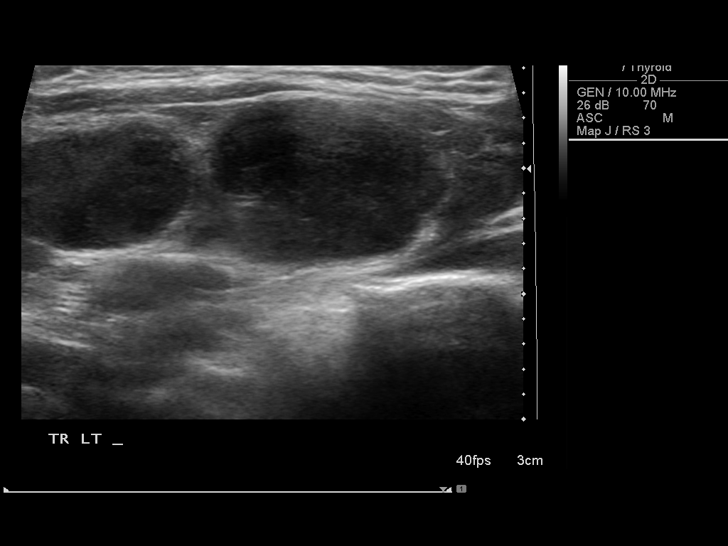
[im 5/14]
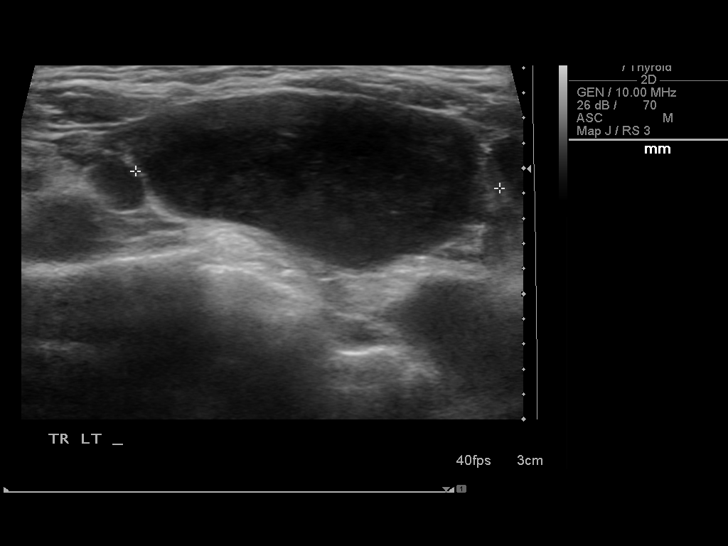
[im 6/14]
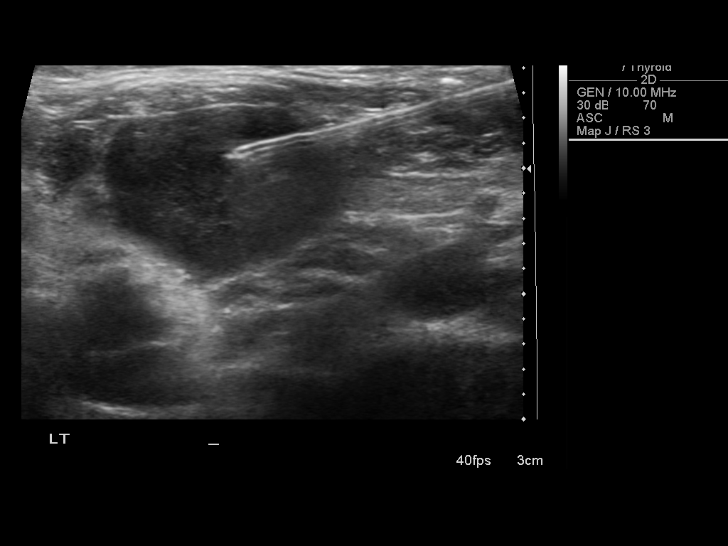
[im 8/14]
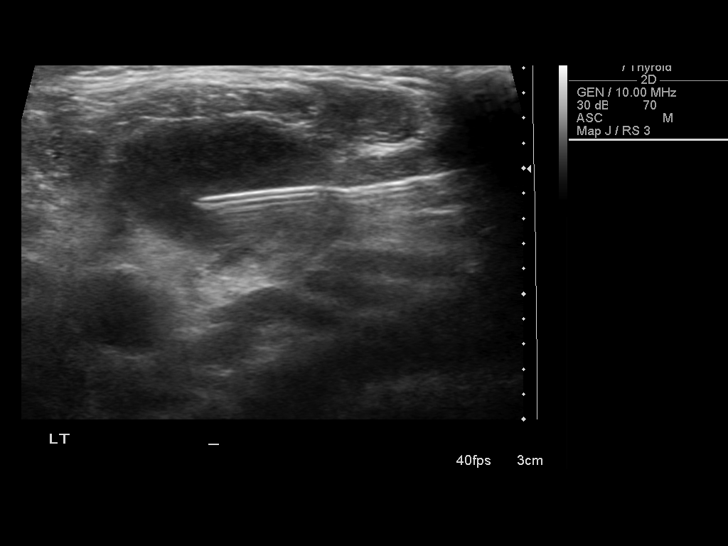
[im 9/14]
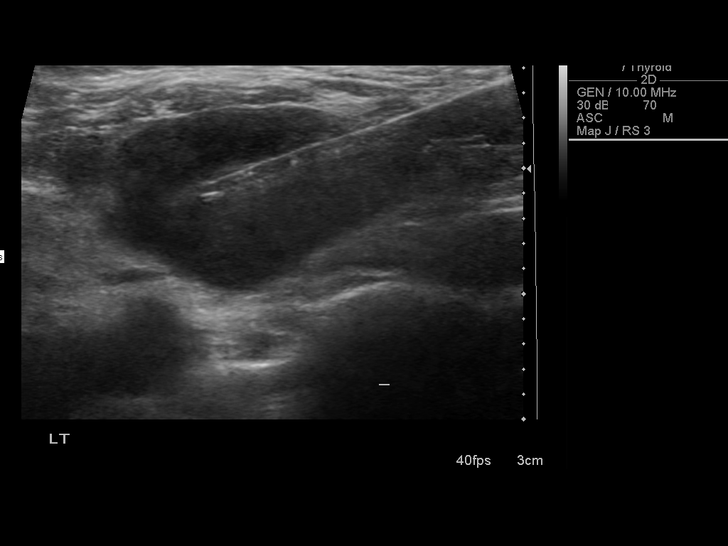
[im 10/14]
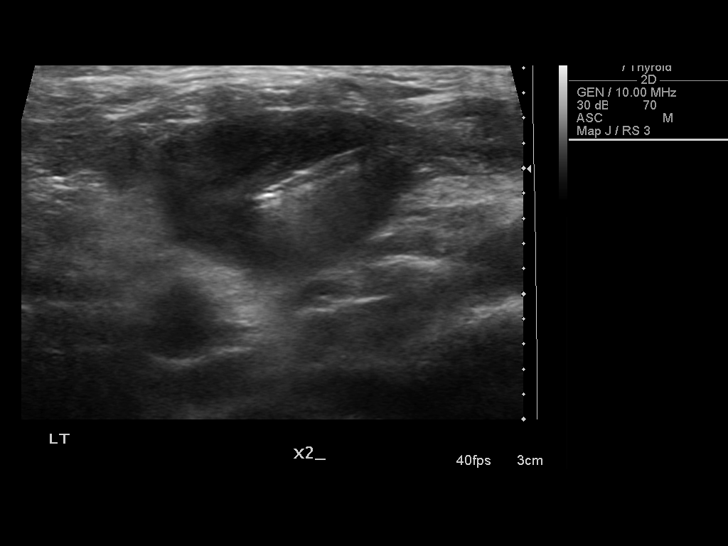
[im 11/14]
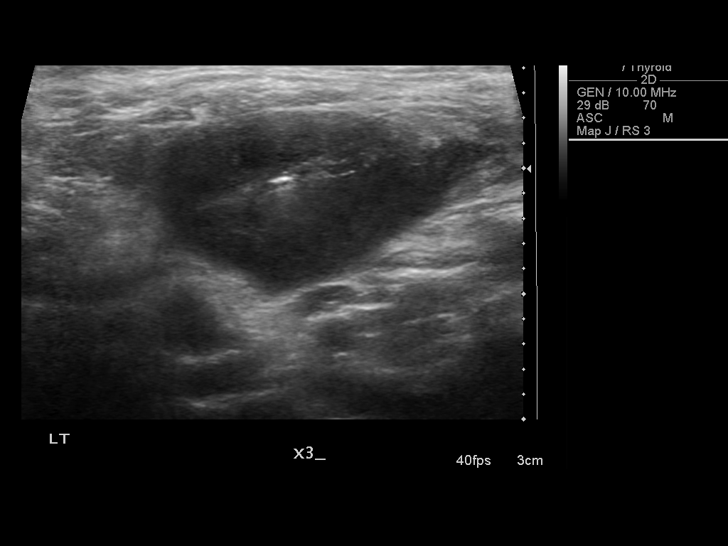
[im 12/14]
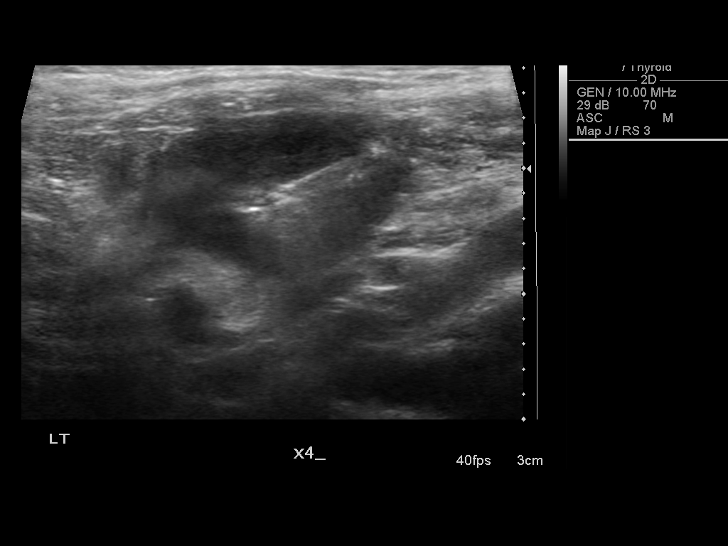
[im 13/14]
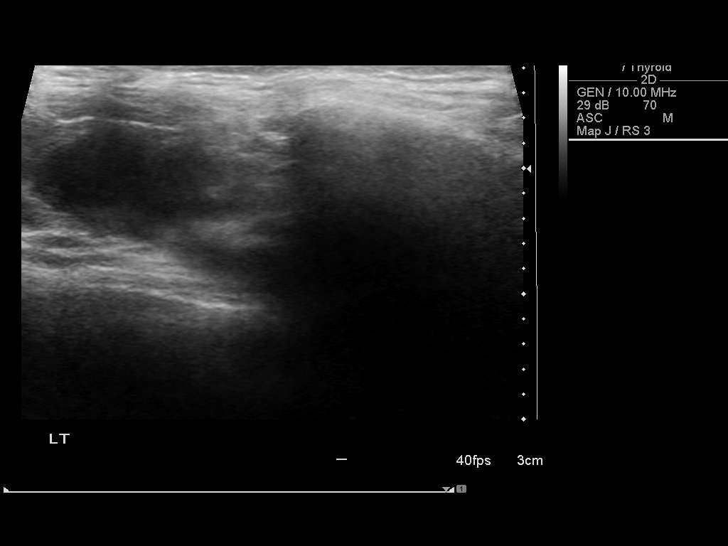
[im 14/14]
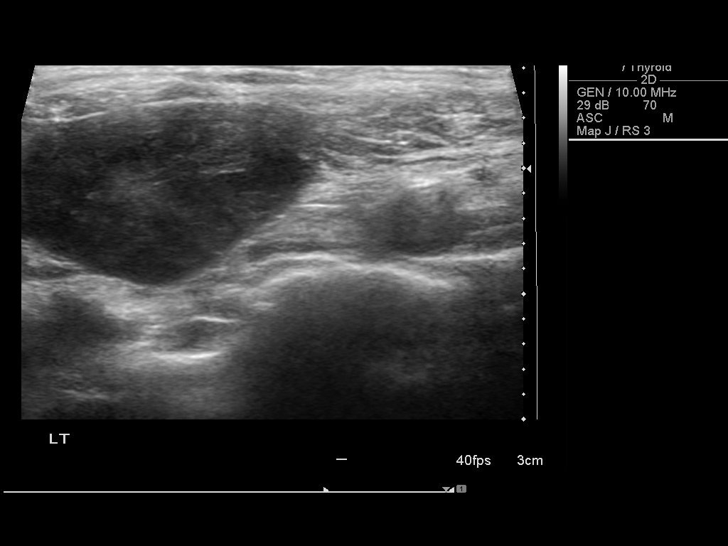

[13 of 14 positions shown; findings below may reference images not displayed]

PROCEDURE(S): ULTRASOUND GUIDED CERVICAL LYMPH NODE BIOPSY

Medications:Versed 4 mg, Fentanyl 150 mcg. A radiology nurse
monitored the patient for moderate sedation.

Moderate sedation time:28 minutes

Fluoroscopy time: None

Procedure:The procedure was explained to the patient.  The risks
and benefits of the procedure were discussed and the patient's
questions were addressed.  Informed consent was obtained from the
patient.  Left side of the neck was evaluated with ultrasound.  A
large left supraclavicular lymph node was identified.  The left
side of the neck was prepped and draped in a sterile fashion.  The
skin was anesthetized with 1% lidocaine.  Two fine needle
aspirations were performed using 21 gauge InRad needles with
ultrasound guidance.  Four core biopsies were obtained using
ultrasound guidance with an 18 gauge core device.
FINDINGS: Enlarged left cervical lymph nodes.  Largest lymph node is
in the left supraclavicular region measuring up to 2.9 cm.  Needle
position was confirmed within the lymph node.

Complications: None
IMPRESSION: Ultrasound guided fine needle aspirations and core
biopsies of a left supraclavicular lymph node.

## 2010-11-11 MED ORDER — MIDAZOLAM HCL 5 MG/5ML IJ SOLN
INTRAMUSCULAR | Status: DC | PRN
  Administered 2010-11-11 (×4): 1 mg via INTRAVENOUS

## 2010-11-11 MED ORDER — FENTANYL CITRATE 0.05 MG/ML IJ SOLN
INTRAMUSCULAR | Status: DC | PRN
  Administered 2010-11-11 (×3): 50 ug via INTRAVENOUS

## 2010-11-11 MED ORDER — SODIUM CHLORIDE 0.9 % IV SOLN
INTRAVENOUS | Status: DC
  Administered 2010-11-11: 13:00:00 via INTRAVENOUS

## 2010-11-11 MED ORDER — ACETAMINOPHEN 500 MG PO TABS
500.0000 mg | ORAL_TABLET | Freq: Four times a day (QID) | ORAL | Status: DC | PRN
  Filled 2010-11-11: qty 1

## 2010-11-11 NOTE — Progress Notes (Signed)
Report received from [facility] RN

## 2010-11-11 NOTE — ED Notes (Signed)
02 dc'd

## 2010-11-11 NOTE — H&P (Signed)
H& P update for LN biopsy in radiology  11/11/10.  Lungs : CTA bilaterally CV: RRR 4/6 murmur  Airway : 1 ASA : 2  Palpable adenopathy of left cervical LN chain. No weight loss, night sweats, history of malignancy or pain.  No recent illnesses.    No prior history of complications with anesthesia.  Michael Litter, PA-C

## 2010-11-11 NOTE — ED Notes (Signed)
Exact timeout time is 1405

## 2010-11-11 NOTE — Progress Notes (Signed)
Left neck dressing with band aid CDI

## 2010-11-11 NOTE — Progress Notes (Signed)

## 2010-11-11 NOTE — H&P (Signed)
  H& P update for LN biopsy in radiology  11/11/10.  Lungs : CTA bilaterally CV: RRR 4/6 murmur  Airway : 1 ASA : 2  Palpable adenopathy of left cervical LN chain. No weight loss, night sweats, history of malignancy or pain.  No recent illnesses.    No prior history of complications with anesthesia.  Michael Litter, PA-C

## 2010-11-11 NOTE — Discharge Summary (Signed)
  Discharge at 4:45 p.m.  Contact if any problems or questions.

## 2010-11-11 NOTE — Procedures (Signed)
US-guided FNAs and core biopsies of left cervical supraclavicular lymph node. 2 FNAs and 4 18 gauge cores.  No immediate complication.

## 2010-11-11 NOTE — ED Notes (Signed)
Patient steward score=6 throughout procedure. NSR throughout procedure.

## 2010-11-16 ENCOUNTER — Telehealth: Payer: Self-pay | Admitting: *Deleted

## 2010-11-18 ENCOUNTER — Other Ambulatory Visit: Payer: Self-pay | Admitting: Oncology

## 2010-11-18 ENCOUNTER — Telehealth: Payer: Self-pay | Admitting: Oncology

## 2010-11-18 DIAGNOSIS — R591 Generalized enlarged lymph nodes: Secondary | ICD-10-CM

## 2010-11-18 NOTE — Telephone Encounter (Signed)
gv pt appt for 11/13 @ 2 pm w/DM

## 2010-11-19 ENCOUNTER — Encounter: Payer: Self-pay | Admitting: Oncology

## 2010-11-19 ENCOUNTER — Ambulatory Visit: Payer: Medicare Other

## 2010-11-19 ENCOUNTER — Ambulatory Visit (HOSPITAL_BASED_OUTPATIENT_CLINIC_OR_DEPARTMENT_OTHER): Payer: Medicare Other | Admitting: Oncology

## 2010-11-19 ENCOUNTER — Other Ambulatory Visit (HOSPITAL_COMMUNITY): Payer: Self-pay | Admitting: Oncology

## 2010-11-19 ENCOUNTER — Telehealth: Payer: Self-pay | Admitting: Oncology

## 2010-11-19 ENCOUNTER — Other Ambulatory Visit (HOSPITAL_COMMUNITY)
Admission: RE | Admit: 2010-11-19 | Discharge: 2010-11-19 | Disposition: A | Discharge status: Home / Self Care | Payer: Medicare Other | Source: Ambulatory Visit | Attending: Oncology | Admitting: Oncology

## 2010-11-19 ENCOUNTER — Other Ambulatory Visit (HOSPITAL_BASED_OUTPATIENT_CLINIC_OR_DEPARTMENT_OTHER): Payer: Medicare Other | Admitting: Lab

## 2010-11-19 DIAGNOSIS — D696 Thrombocytopenia, unspecified: Secondary | ICD-10-CM

## 2010-11-19 DIAGNOSIS — C9110 Chronic lymphocytic leukemia of B-cell type not having achieved remission: Secondary | ICD-10-CM | POA: Insufficient documentation

## 2010-11-19 DIAGNOSIS — R591 Generalized enlarged lymph nodes: Secondary | ICD-10-CM

## 2010-11-19 DIAGNOSIS — I6529 Occlusion and stenosis of unspecified carotid artery: Secondary | ICD-10-CM

## 2010-11-19 HISTORY — DX: Thrombocytopenia, unspecified: D69.6

## 2010-11-19 LAB — CBC WITH DIFFERENTIAL/PLATELET
BASO%: 0.3 % (ref 0.0–2.0)
LYMPH%: 82.8 % — ABNORMAL HIGH (ref 14.0–49.7)
MCHC: 33.4 g/dL (ref 31.5–36.0)
MONO#: 1.7 10*3/uL — ABNORMAL HIGH (ref 0.1–0.9)
MONO%: 7.1 % (ref 0.0–14.0)
Platelets: 110 10*3/uL — ABNORMAL LOW (ref 145–400)
RBC: 3.78 10*6/uL (ref 3.70–5.45)
RDW: 14.8 % — ABNORMAL HIGH (ref 11.2–14.5)
WBC: 24.4 10*3/uL — ABNORMAL HIGH (ref 3.9–10.3)

## 2010-11-19 LAB — COMPREHENSIVE METABOLIC PANEL
BUN: 18 mg/dL (ref 6–23)
CO2: 28 mEq/L (ref 19–32)
Calcium: 10.9 mg/dL — ABNORMAL HIGH (ref 8.4–10.5)
Chloride: 102 mEq/L (ref 96–112)
Creatinine, Ser: 0.98 mg/dL (ref 0.50–1.10)

## 2010-11-19 LAB — MORPHOLOGY

## 2010-11-19 LAB — CHCC SMEAR

## 2010-11-19 LAB — LACTATE DEHYDROGENASE: LDH: 195 U/L (ref 94–250)

## 2010-11-19 NOTE — Progress Notes (Signed)
This office note has been dictated.  #[id number]

## 2010-11-19 NOTE — Telephone Encounter (Signed)
gve the pt her jan,march 2013 appts

## 2010-11-19 NOTE — Progress Notes (Signed)
CC:   [name], MD  HISTORY:  Nicole Mercado is a 70 year old white female whom I am asked to see in consultation by Dr. [name] for evaluation of probable chronic lymphocytic leukemia.  This patient, interestingly, had been seen here at the [facility] 4 years ago by Dr. Vicente Serene Odogwu for evaluation of thrombocytopenia.  During that visit, Dr. Dalene Carrow noted that the patient had a white count of 10.3 with 71% lymphocytes and a platelet count of a 121,000, hemoglobin 13.2.  The peripheral smear showed an abundance of lymphocytes and smudge cells.  It was felt that the patient most likely had chronic lymphocytic leukemia.  Baseline CT scans of neck, chest, abdomen and pelvis were recommended and the patient was to return in a few weeks.  Apparently, the patient did not follow through and was lost to followup.  The patient has done well.  She is completely asymptomatic.  A few weeks ago she needed refills on her medicines primarily for hypertension.  She needed a doctor's visit and during that visit she happened to mention that she had noted an enlarged lymph node in her left neck a couple of weeks previously.  This lymph node was asymptomatic.  The patient's observations were confirmed.  She underwent a CT scan of the neck carried out with IV contrast on 11/05/2010.  That report reads as follows:  Cervical lymph nodes were increased in number throughout the neck.  There was an abnormally enlarged left level IIIB node measuring 11 x 18 x 22 mm.  An abnormal left level IB node is indenting the lateral aspect of the left submandibular gland measuring 11 x 15 x 14 mm.  There was also left supraclavicular adenopathy at level IV with individual nodes measuring up to 16 mm in short axis (16 x 18 x 32 mm overall).  Right level IV nodes were increased in size and number measuring up to 7 mm in short axis.  The right level III nodes were increased in size and number measuring up to 9 mm in  short axis.  The lymph nodes seen in the superior mediastinum were felt to be within normal limits.  There was left carotid calcified atherosclerosis with suspected hemodynamically significant stenosis.  The patient was referred for needle biopsy carried out under ultrasound guidance on 11/11/2010.  There was a large left supraclavicular lymph node identified measuring 2.9 cm.  Apparently this lymph node was biopsied. A fine-needle aspirate of this left supraclavicular lymph node showed monomorphic lymphocytic population.  B-cell antigens were expressed.  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  CD 20 was positive.  CD 23 was positive.  CD 19 was positive. It was felt that the presence of CD 23 positivity and FMC-7 negativity favored small lymphocytic lymphoma.  Cyclin D1 immunoperoxidase stain was negative.  There was low mitotic rate and no necrosis.  No CD 10 expression was identified.  The patient is referred here for further evaluation.  PAST MEDICAL HISTORY:  Notable for hypertension at least 20-30 years. As stated, the patient has at least a 4-year history of thrombocytopenia.  No other significant medical problems.  The patient has undergone 4 C-sections.  She had a tubal ligation.  She has undergone total abdominal hysterectomy, bilateral salpingo-oophorectomy 35 years ago for heavy bleeding associated with fibroid tumors.  She had a laparoscopic cholecystectomy in 2002 by Dr. Claud Kelp.  She had gallstones.  No history of injuries.  No  history of blood transfusions.  ALLERGIES:  No allergies to any medicines.  MEDICINES:  Aspirin 81 mg daily, calcium and vitamin D 2 tablets daily, vitamin B12 orally, estradiol patch change weekly, lisinopril 20 mg daily, Lopressor 50 mg twice a day, and multivitamins.  FAMILY HISTORY:  Notable for a son who died of pancreatic cancer at age 53 approximately 3 years ago.  Father died in his mid  67s and mother died in her mid 68s, apparently of heart attack.  The patient has 1 sister who is in good health.  She has 2 living sons who are okay and 6 grandchildren in good health.  No history of any blood disorders or leukemia or lymphoma.  SOCIAL HISTORY:  The patient denies any use of tobacco products.  She had been a modest drinker in the past, has abstained from alcohol for the past 10 years.  Apparently she had pancreatitis associated with her gallstones.  The patient was born and raised in [place], [place], has graduated high school, lived in [place] for 30 years, also lived in [place] for awhile.  She has been in [place] for the past 13 years.  Currently lives in a condominium, 1 level, drives.  She was widowed 15 years ago when her husband died at age 58 of lung cancer.  REVIEW OF SYSTEMS:  The patient in general feels fine, totally asymptomatic, has good energy.  No neurologic problems.  She wears glasses for reading.  Vision and hearing are good.  She thinks that she may be developing a small cataract in her right eye.  She has some rhinorrhea, possibly some sinusitis but no history of hayfever.  GI, cardiac and respiratory symptoms are negative.  Her weight has been fairly stable at about 98 pounds.  She has never had a colonoscopy nor has she ever had a mammogram.  These have been recommended to her.  No urinary symptomatology.  No vaginal bleeding or GYN symptoms.  No swelling of the legs, blood clots, intermittent claudication.  She has leg cramps.  She bruises with trauma.  No bleeding problems.  No arthritic or musculoskeletal pain.  No fever or night sweats.  No skin rashes, pruritus, skin cancers.  No history of depression or psychological problems.  PHYSICAL EXAM:  General:  The patient is well-appearing woman looking somewhat younger than her stated age, soon to be 70 years old.  Vital signs:  Weight is 98 pounds 11.2 ounces, height 5 feet even,  body surface area 1.38 m squared.  Blood pressure 156/75 in the left arm sitting pulse 50 and regular, respirations regular and unlabored.  She is afebrile at 98.4.  HEENT:  There is no scleral icterus.  Pupillary and extraocular movements are normal.  Mouth and pharynx benign. Dentition is well-preserved.  Neck:  Notable for a visible and easily palpable left mid left neck node measuring 1.5 to 2 cm, very discrete. The patient also has left supraclavicular adenopathy which can be felt at the level of the clavicle.  Most of this is subclavicular so cannot be measured.  I could not appreciate any other adenopathy, specifically any posterior cervical, right neck, supraclavicular or any axillary adenopathy.  The patient may have some subtle right inguinal adenopathy. No obvious thyroid enlargement or bruit in the neck.  Lungs:  Clear to percussion and auscultation.  Cardiac:  Regular rhythm with systolic ejection murmur which I brought to the patient's attention.  She was not aware of this.  She has some large seborrheic keratoses over the back. Breasts:  Not examined.  Physical examination was carried out with the patient's friend, Karren Cobble, present.  Abdomen:  Benign with no organomegaly or masses palpable.  No tenderness.  Extremities:  No peripheral edema, clubbing, palm erythema, petechiae, or purpura.  She has some slight arthritis in both index fingers.  Neurologic:  Exam is grossly normal.  LABORATORY DATA:  Today, white count 24.4, ANC 2.3, hemoglobin 12.7, hematocrit 38.2, platelets 110,000 and both in EDTA and citrate.  Red cell indices were normal.  Neutrophils were 10%, lymphocytes 82.83% and the absolute lymphocyte count was 20.2.  Inspection of the peripheral smear disclosed marked increase in lymphocytes which looked fairly monomorphic.  Lymphocytes were, for the most part, well differentiated. There were a few smudge cells.  Chemistries including LDH and uric  acid are pending as is flow cytometry in the peripheral blood.  In looking over the patient's imaging studies, I do not see where she has had a recent chest x-ray.  She did have CT scans in February of 2002.  Her most recent chest x-ray was February 20, 2000.  IMPRESSION AND PLAN:  I believe that this patient most likely has chronic lymphocytic leukemia.  I will need to confirm the immuno flow studies with Dr. Laureen Ochs.  At this point, the patient would appear to have stage I disease by the RAI classification if we assume that the thrombocytopenia is most likely immune-mediated.  The patient's white count 4 years ago was approximately 10,000 and has risen apparently up to 24.4 today.  The patient's platelet count remained stable.  On the basis of these changes, I would think that the patient's disease is acting in a somewhat indolent fashion.  She is totally asymptomatic.  I spent about a half hour going over the findings with this patient. When I first started interviewing the patient, she stated right up front that she did not want chemotherapy or radiation.  I think she has somewhat hesitancy about medical care.  In general, she has been fairly healthy but I do not get the sense that she follows through with physician recommendations.  That was clearly evident 4 years ago.  She may be concerned about financial issues.  In any event, I spent a fair amount of time explaining to the patient that this condition needs monitoring and that at some point, she may require systemic treatment consisting of chemotherapy.  I recommended the patient have a chest x-ray, CT of abdomen and pelvis.  She wants to think about this.  I again emphasized the importance of followup.  I do not think she needs any treatment at the present time.  I recommended that the patient have a CBC in 2 months and that we see her again in 4 months at which time she should have a CBC and chemistries.  Other issues of  importance are the fact that the patient was noted to have calcification of her left carotid artery and possible stenosis on the CT scan of the neck carried out on 11/05/2010.  This probably needs Doppler followup.  The patient has never had a colonoscopy or mammogram and this, too, needs followup.    ______________________________ Samul Dada, M.D. DSM/MEDQ  D:  11/19/2010  T:  11/19/2010  Job:  [id number]

## 2010-11-20 LAB — FLOW CYTOMETRY

## 2010-11-21 ENCOUNTER — Other Ambulatory Visit: Payer: Self-pay | Admitting: Oncology

## 2010-11-21 DIAGNOSIS — R591 Generalized enlarged lymph nodes: Secondary | ICD-10-CM

## 2010-11-27 NOTE — Progress Notes (Signed)
Flow cytometry report received and given to MD.

## 2010-12-03 NOTE — Progress Notes (Signed)
I reviewed the CT scan of the neck with IV contrast on this patient on 11/05/2010.  The patient does indeed have generalized bilateral cervical and supraclavicular lymphadenopathy that was maximum on the left at level 3 and level 4.  Lymph nodes in the left neck were more prominent and particularly the supraclavicular nodes were more prominent than the cervical nodes.    ______________________________ Samul Dada, M.D. DSM/MEDQ  D:  12/02/2010  T:  12/03/2010  Job:  [id number]

## 2011-01-02 ENCOUNTER — Telehealth: Payer: Self-pay | Admitting: Oncology

## 2011-01-02 NOTE — Telephone Encounter (Signed)
pt called tio cx 01/20/11 appt and wishes not to r/.s and stated she would be keeping the 3/13 appts   aom

## 2011-01-13 ENCOUNTER — Other Ambulatory Visit: Payer: Self-pay | Admitting: Medical Oncology

## 2011-01-13 DIAGNOSIS — R591 Generalized enlarged lymph nodes: Secondary | ICD-10-CM

## 2011-01-20 ENCOUNTER — Other Ambulatory Visit: Payer: Medicare Other | Admitting: Lab

## 2011-03-21 ENCOUNTER — Ambulatory Visit (HOSPITAL_COMMUNITY)
Admission: RE | Admit: 2011-03-21 | Discharge: 2011-03-21 | Disposition: A | Discharge status: Home / Self Care | Payer: Medicare Other | Source: Ambulatory Visit | Attending: Oncology | Admitting: Oncology

## 2011-03-21 ENCOUNTER — Other Ambulatory Visit (HOSPITAL_BASED_OUTPATIENT_CLINIC_OR_DEPARTMENT_OTHER): Payer: Medicare Other | Admitting: Lab

## 2011-03-21 ENCOUNTER — Telehealth: Payer: Self-pay | Admitting: Oncology

## 2011-03-21 ENCOUNTER — Ambulatory Visit (HOSPITAL_BASED_OUTPATIENT_CLINIC_OR_DEPARTMENT_OTHER): Payer: Medicare Other | Admitting: Oncology

## 2011-03-21 ENCOUNTER — Encounter: Payer: Self-pay | Admitting: Oncology

## 2011-03-21 VITALS — BP 146/64 | HR 51 | Temp 96.9°F | Ht 60.0 in | Wt 100.0 lb

## 2011-03-21 DIAGNOSIS — R591 Generalized enlarged lymph nodes: Secondary | ICD-10-CM

## 2011-03-21 DIAGNOSIS — C9110 Chronic lymphocytic leukemia of B-cell type not having achieved remission: Secondary | ICD-10-CM

## 2011-03-21 DIAGNOSIS — R011 Cardiac murmur, unspecified: Secondary | ICD-10-CM

## 2011-03-21 DIAGNOSIS — D696 Thrombocytopenia, unspecified: Secondary | ICD-10-CM

## 2011-03-21 DIAGNOSIS — D7289 Other specified disorders of white blood cells: Secondary | ICD-10-CM

## 2011-03-21 DIAGNOSIS — I6529 Occlusion and stenosis of unspecified carotid artery: Secondary | ICD-10-CM

## 2011-03-21 DIAGNOSIS — I7090 Unspecified atherosclerosis: Secondary | ICD-10-CM

## 2011-03-21 DIAGNOSIS — R599 Enlarged lymph nodes, unspecified: Secondary | ICD-10-CM | POA: Insufficient documentation

## 2011-03-21 LAB — COMPREHENSIVE METABOLIC PANEL
AST: 25 U/L (ref 0–37)
Alkaline Phosphatase: 60 U/L (ref 39–117)
BUN: 20 mg/dL (ref 6–23)
Calcium: 10.4 mg/dL (ref 8.4–10.5)
Creatinine, Ser: 1.09 mg/dL (ref 0.50–1.10)
Total Bilirubin: 0.6 mg/dL (ref 0.3–1.2)

## 2011-03-21 LAB — CBC WITH DIFFERENTIAL/PLATELET
Basophils Absolute: 0 10*3/uL (ref 0.0–0.1)
EOS%: 0.2 % (ref 0.0–7.0)
HCT: 36.3 % (ref 34.8–46.6)
HGB: 12.2 g/dL (ref 11.6–15.9)
LYMPH%: 82.9 % — ABNORMAL HIGH (ref 14.0–49.7)
MCH: 34.7 pg — ABNORMAL HIGH (ref 25.1–34.0)
MCV: 102.9 fL — ABNORMAL HIGH (ref 79.5–101.0)
MONO%: 7 % (ref 0.0–14.0)
NEUT%: 9.7 % — ABNORMAL LOW (ref 38.4–76.8)

## 2011-03-21 IMAGING — CR DG CHEST 2V
2 series · 2 of 2 positions shown · non-contrast
Comparison: None.

CLINICAL DATA: Lymphadenopathy. 785.6.

CHEST - 2 VIEW

[w chest pa]
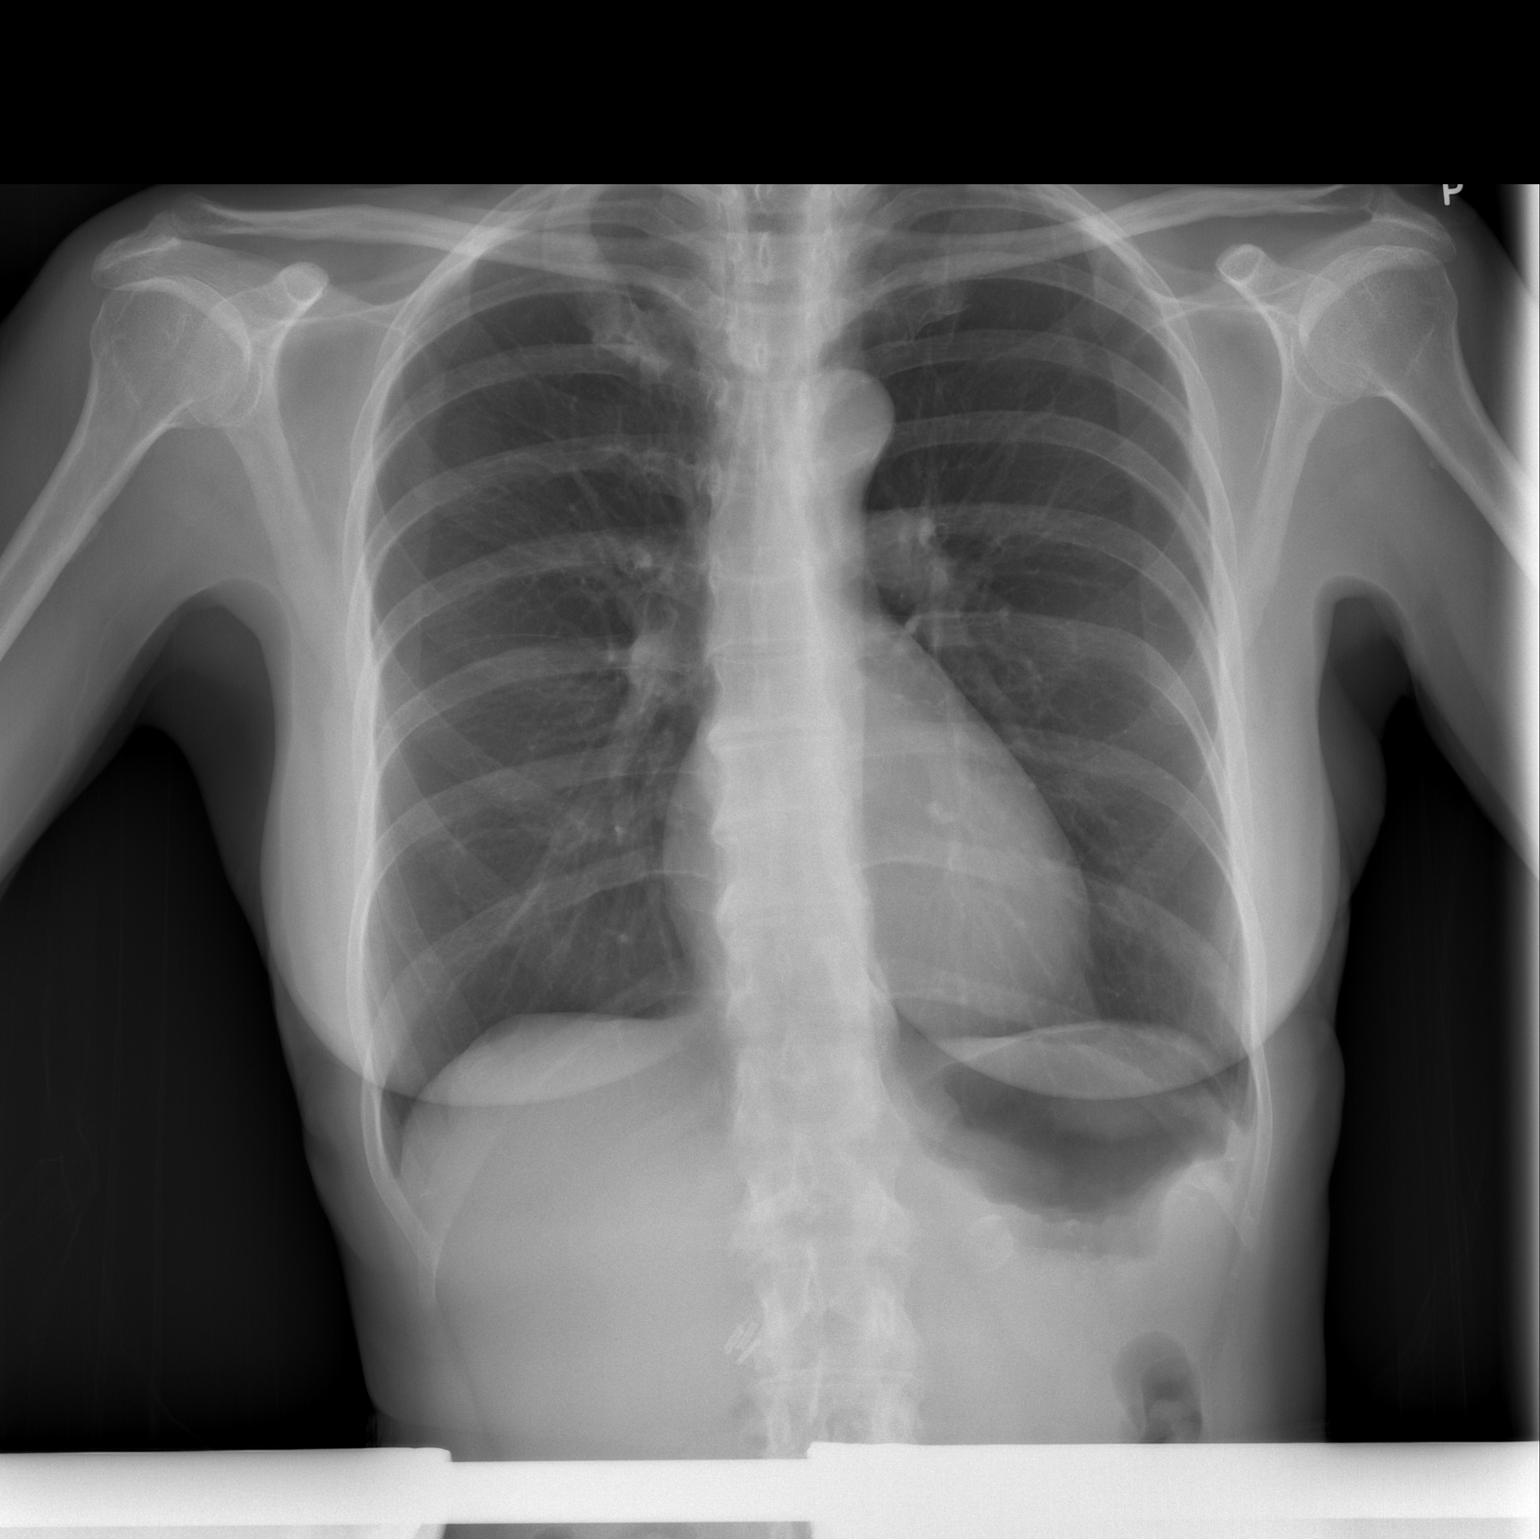

[w chest lat]
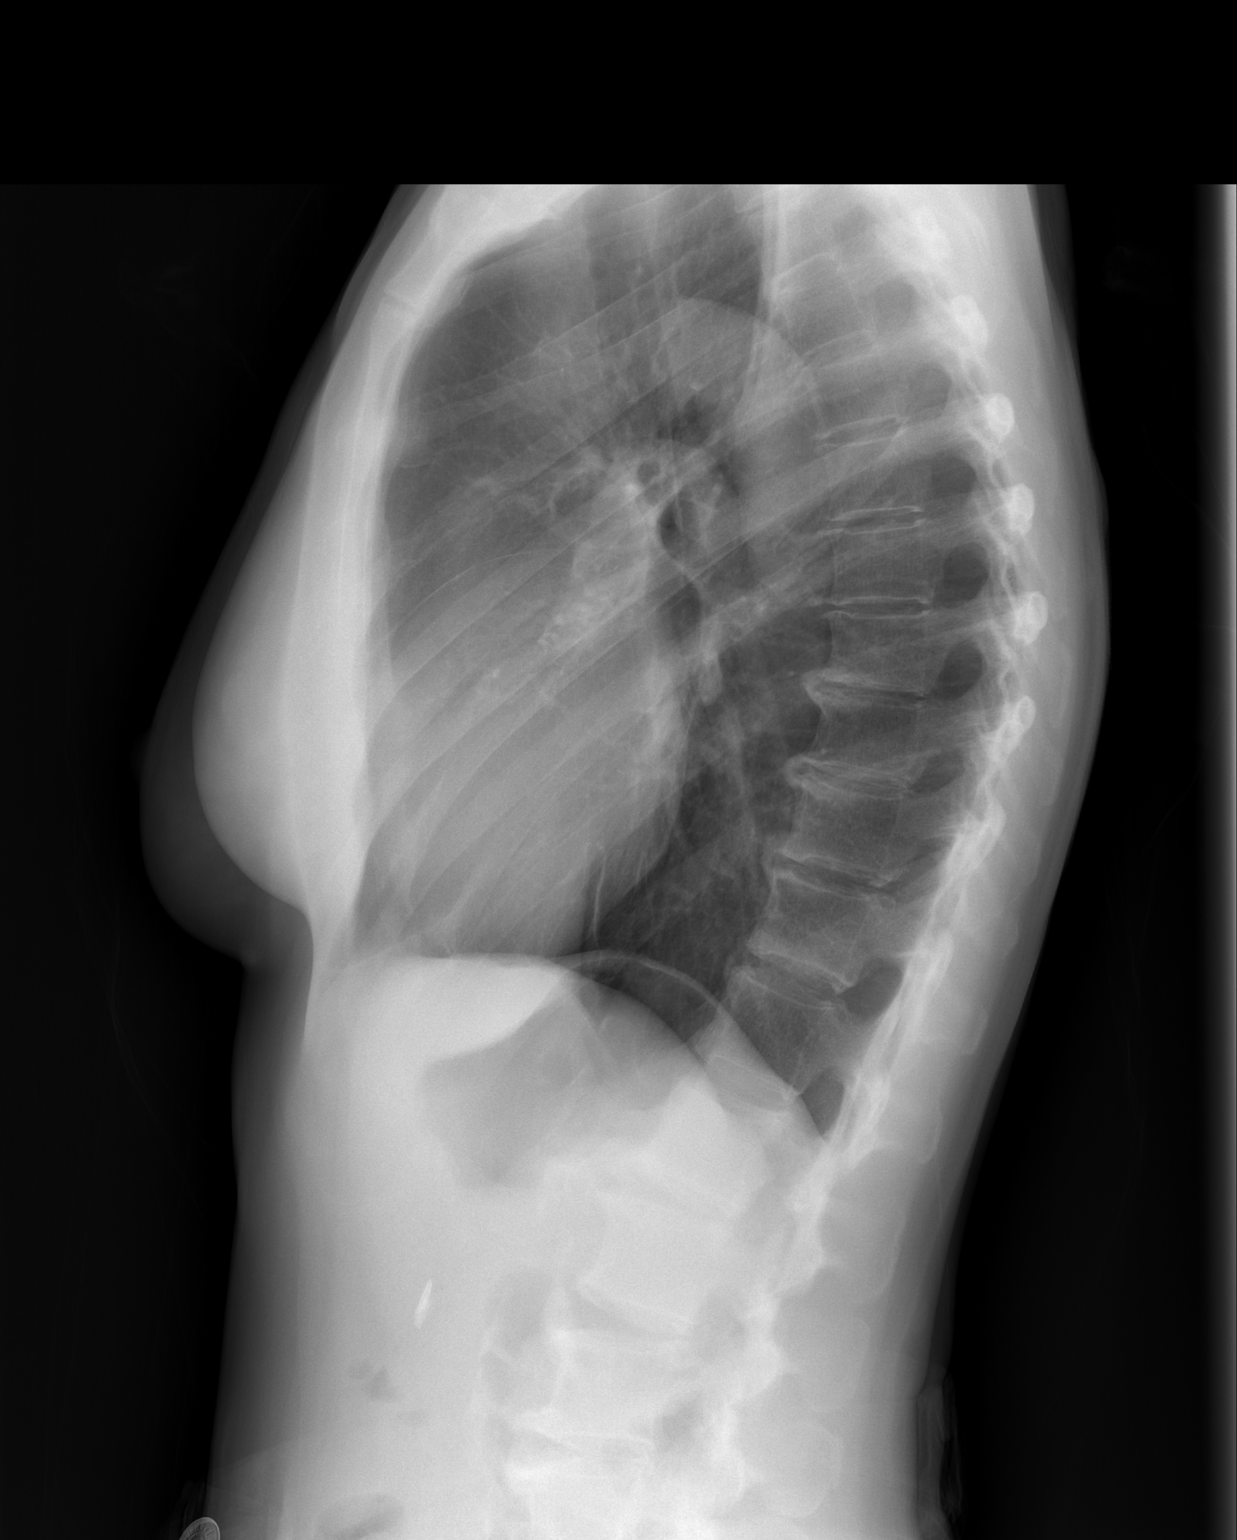

[2 of 2 positions shown; findings below may reference images not displayed]

FINDINGS: The heart size and vascularity are normal and the lungs are clear.
No effusions.  No significant osseous abnormality.  No visible
hilar or mediastinal adenopathy.
IMPRESSION: Normal chest.

## 2011-03-21 NOTE — Progress Notes (Signed)
CC:   [name], MD Richard A. Alanda Amass, M.D.  PROBLEM LIST: 1. Chronic lymphocytic leukemia with onset probably in 2008 or 2009,     now with a confirmatory core needle biopsy of a left     supraclavicular lymph node on 11/11/2010 and confirmatory flow     cytometry on the peripheral blood carried out on 11/19/2010.  The     patient has stage I disease at this time, with associated     thrombocytopenia and hypogammaglobulinemia, the latter first     detected in September 2008. 2. Thrombocytopenia. 3. Hypogammaglobulinemia. 4. Left carotid artery calcification and possibly stenosis noted on CT     scan of the neck on 11/05/2010. 5. Systolic ejection murmur with radiation into both sides of the     neck. 6. Hypertension.  MEDICATION: 1. Aspirin 81 mg daily. 2. Calcium and vitamin D 500/125 mg 2 tablets daily. 3. Vitamin B12 1 tablet daily. 4. Estradiol (Climara) patch, change weekly. 5. Lisinopril 20 mg daily. 6. Lopressor 50 mg by mouth twice daily. 7. Multivitamins and minerals 1 tablet daily.  IMMUNIZATIONS:  Pneumovax given in December 2012, flu shot given December 2012.  HISTORY:  I am seeing Nicole Mercado today for the first time since her initial visit here on 11/19/2010 for followup of what we now know is chronic lymphocytic leukemia.  The patient is accompanied by her significant other, Nicole Mercado.  Nicole Mercado denies any change in her condition and feels generally well.  She did comply with Pneumovax in December 2012, also a flu shot.  The patient denies any sense of ill health, fever, chills, sweats, any lack of energy, any history of infections or medical events over the past few months since she was last seen here in November.  On additional follow up, the patient has never had anything suggesting a stroke.  She was not aware of her cardiac murmur.  She has never had mammogram or colonoscopy.  Today's session was rather lengthy, lasting about an hour, as we  talked about preventive medicine and my concerns, particularly about her left carotid artery findings on the CT scan of the neck on 11/05/2010, and her cardiac murmur.  The patient is not sure whether she has ever undergone testing for dyslipidemia.  I believe her contact with doctors has been minimal through the years, and she has been fortunately blessed with pretty good health.  PHYSICAL EXAMINATION:  General:  She is certainly looks well and younger than her stated age of 82.  Weight is 100 pounds, height 5 feet even, body surface area 1.39 sq m.  Vital Signs:  Blood pressure 146/64. Other vital signs are normal.  HEENT:  There is no scleral icterus. Mouth and pharynx are benign.  Lymphatic:  She does have a 1.5 to 2 cm left supraclavicular lymph node located medially near the head of the clavicle.  A lymph node that was in the left mid neck apparently has regressed, and I really could not feel any other convincing lymphadenopathy in the neck or supraclavicular or axillary areas.  There may be a subtle right inguinal lymph node measuring a centimeter or less.  Lungs:  Clear to percussion and auscultation.  Cardiac Exam: Regular rhythm with prominent systolic ejection murmur with radiation into both sides of the neck.  This does not sound like a benign murmur to me.  Back:  No skeletal tenderness.  She has some large seborrheic keratoses over the back.  Breasts: Not examined.  Abdomen:  Benign with no organomegaly or masses palpable.  Extremities:  No peripheral edema or clubbing.  Neurologic Exam:  Grossly normal.  As stated, the patient denies any symptomatology that would suggest prior TIA.  LABORATORY DATA:  Today, white count 19.3, ANC 1.9, hemoglobin 12.2, hematocrit 36.3, platelets 107,000.  In the past, we ruled out clumping. Absolute lymphocyte count today was 16.0, as compared with 20.2 on 11/19/2010 and 7.4 on 09/16/2006.  There are 10% neutrophils, 83% lymphs, 7%  monocytes.  Chemistries today are pending.  Chemistries from 11/19/2010 were normal except for calcium of 10.9 in conjunction with an albumin of 4.7.  I did not look at the peripheral smear today, but we did look at it 4 months ago.  On 09/16/2006, the IgG level was 556, IgA level 44, and IgM level was 18.  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  IMAGING STUDIES: 1. CT scan of the neck with IV contrast on 11/05/2010 showed     generalized bilateral cervical and supraclavicular lymphadenopathy,     maximal on the left at levels III and IV.  The left supraclavicular     lymph node at level IV measured 16 x 18 x 32 mm overall.  Small     nodes seen in the superior mediastinum were felt to be normal.     There was felt to be increased number of the axillary lymph nodes     that were partially visible on series 3, image 20.  Thyroid was     negative.  There was coarse circumferential left carotid calcified     atherosclerosis with suspected hemodynamically significant     stenosis. 2. Ultrasound-guided cervical lymph node biopsy was carried out on     11/11/2010. 3. Chest x-ray, 2 view, from 03/21/2011 was normal.  IMPRESSION AND PLAN:  Today's session was fairly extended, lasting about an hour. 1. We reviewed the patient's flow cytometry on the peripheral blood.     This showed a monoclonal B-cell population consistent with chronic     lymphocytic leukemia/small lymphocytic lymphoma.  By clinical     criteria, this is most consistent with chronic lymphocytic     leukemia.  The patient has associated thrombocytopenia and     hypogammaglobulinemia and a palpable lymph node in her left     supraclavicular area.  She has stage I disease in the RAI     classification.  She does not require any treatment at this time.     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   She was urged to seek medical     attention should she have any febrile illnesses or any symptoms     that would be suggestive of infection. 2. At some point, I would like to get at least an ultrasound of the     abdomen, possibly a CT scan of the abdomen and pelvis, to assess     for lymphadenopathy and hepatosplenomegaly. 3. My main concern has to do with the atherosclerosis, possible     stenosis of the left carotid artery.  I emphasized how important it     was that the patient have referral and have this evaluated with     Doppler studies.  She may need surgery.  She is agreeable to     evaluation. 4. Systolic  ejection murmur.  This, too, needs evaluation with a 2D     echocardiogram.  I thought we could evaluate both the left carotid     artery and the patient's cardiac status and the etiology of her     murmur with a referral to Dr. Susa Griffins. 5. The patient has never had a screening mammogram or colonoscopy.  I     also emphasized the importance of these screening measures.  The     patient wanted to take care of some of the other priorities first.     I told her she could call us, and we would be happy to schedule her     for a screening mammogram. 6. We will plan to see Nicole Mercado in 4 months, at which time we will     check CBC and chemistries.    ______________________________ Samul Dada, M.D. DSM/MEDQ  D:  03/21/2011  T:  03/21/2011  Job:  [id number]

## 2011-03-21 NOTE — Telephone Encounter (Signed)
Gv pt appt for july 2013.  scheduled  pt for appt with Dr. Alanda Amass for 04/1 @ [facility].  Gv pt name to medical rec t fax over noted to the office

## 2011-03-21 NOTE — Progress Notes (Signed)
This office note has been dictated.  #[id number]

## 2011-05-30 ENCOUNTER — Encounter: Payer: Self-pay | Admitting: Vascular Surgery

## 2011-06-03 ENCOUNTER — Other Ambulatory Visit (INDEPENDENT_AMBULATORY_CARE_PROVIDER_SITE_OTHER): Payer: Medicare Other

## 2011-06-03 ENCOUNTER — Encounter: Payer: Self-pay | Admitting: Vascular Surgery

## 2011-06-03 ENCOUNTER — Ambulatory Visit (INDEPENDENT_AMBULATORY_CARE_PROVIDER_SITE_OTHER): Payer: Medicare Other | Admitting: Vascular Surgery

## 2011-06-03 VITALS — BP 191/69 | HR 54 | Resp 18 | Ht 60.0 in | Wt 99.0 lb

## 2011-06-03 DIAGNOSIS — I6529 Occlusion and stenosis of unspecified carotid artery: Secondary | ICD-10-CM

## 2011-06-03 NOTE — Progress Notes (Signed)
[facility]  Vascular Consult Note    Patient name: Nicole Mercado MRN: [id number] DOB: 1940-03-04 Sex: female   Referred by: Dr Alanda Amass  Reason for referral: Severe asymptomatic left internal carotid artery stenosis  HISTORY OF PRESENT ILLNESS: Patient is a very pleasant active 71 year old white female who was noted to have a carotid bruit and underwent carotid duplex for further evaluation. 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 She specifically denies any prior amaurosis fugax, transient ischemic attack, or stroke. She is right-handed. She does not have any strong risk factors. She is hypertensive but has never smoked. She does have significant aortic valvular stenosis. She is asymptomatic from this.  Past Medical History  Diagnosis Date  . Thrombocytopenia 11/19/2010  . Hypertension   . Leukemia     Past Surgical History  Procedure Date  . Cesarean section     FIVE  . Cholecystectomy   . Appendectomy   . Abdominal hysterectomy     History   Social History  . Marital Status: Legally Separated    Spouse Name: [name]    Number of Children: [name]  . Years of Education: [name]   Occupational History  . Not on file.   Social History Main Topics  . Smoking status: Never Smoker   . Smokeless tobacco: Never Used  . Alcohol Use: No  . Drug Use: No  . Sexually Active: Not on file   Other Topics Concern  . Not on file   Social History Narrative  . No narrative on file    Family History  Problem Relation Age of Onset  . Heart disease Mother   . Hypertension Mother   . Heart disease Father   . Hypertension Father     No Known Allergies  Prior to Admission medications   Medication Sig Start Date End Date Taking? Authorizing Provider  acetaminophen (TYLENOL) 500 MG tablet Take 1,000 mg by mouth every 6 (six) hours as needed. pain    Yes Historical Provider, MD   aspirin EC 81 MG tablet Take 81 mg by mouth daily.     Yes Historical Provider, MD  Calcium-Vitamin D 500-125 MG-UNIT TABS Take 2 tablets by mouth daily.    Yes Historical Provider, MD  Cyanocobalamin (VITAMIN B 12 PO) Take 1 tablet by mouth daily.    Yes Historical Provider, MD  estradiol (CLIMARA - DOSED IN MG/24 HR) 0.05 mg/24hr Place 1 patch onto the skin once a week.     Yes Historical Provider, MD  lisinopril (PRINIVIL,ZESTRIL) 20 MG tablet Take 20 mg by mouth daily.     Yes Historical Provider, MD  metoprolol (LOPRESSOR) 50 MG tablet Take 50 mg by mouth 2 (two) times daily.     Yes Historical Provider, MD  Multiple Vitamins-Minerals (MULTIVITAMINS THER. W/MINERALS) TABS Take 1 tablet by mouth daily.     Yes Historical Provider, MD     REVIEW OF SYSTEMS: Cardiovascular: No chest pain, chest pressure, palpitations, orthopnea, or dyspnea on exertion. No claudication or rest pain,  No history of DVT or phlebitis. Pulmonary: No productive cough, asthma or wheezing. Neurologic: No weakness, paresthesias, aphasia, or amaurosis. No dizziness. Hematologic: No bleeding problems or clotting disorders. Musculoskeletal: No joint pain or joint swelling. Gastrointestinal: No blood in stool or hematemesis Genitourinary: No dysuria or hematuria. Psychiatric:: No history of major depression. Integumentary: No rashes or ulcers. Constitutional: No fever or chills.  PHYSICAL EXAMINATION:  Filed Vitals:   06/03/11 0932  BP: 191/69  Pulse: 54  Resp:     General: The patient appears their stated age. Pulmonary: There is a good air exchange bilaterally without wheezing or rales. Abdomen: Soft and non-tender with normal pitch bowel sounds. Musculoskeletal: There are no major deformities.  There is no significant extremity pain. Neurologic: No focal weakness or paresthesias are detected, Skin: There are no ulcer or rashes noted. Psychiatric: The patient has normal affect. Cardiovascular: There is  a regular rate and rhythm with t significant murmur appreciated. The murmur is mainly systolic Carotids with the bruit bilaterally. His may be transmitted from her aortic valvular stenosis murmur Pulse status: 2+ radial femoral and dorsalis pedis pulses bilaterally  Diagnostic Studies: Carotid duplex in our office was done to assure her that she had normal internal carotid and confirm her degree of stenosis. This did show critical stenosis with end-diastolic velocities on the left internal carotid of 115 cm/s. The carotid does become normal distal to this.  Outside Studies/Documentation Historical records were reviewed.  They showed carotid duplex showed tear left carotid stenosis  Medication Changes: None  Assessment:  Severe greater than 80% left internal carotid artery stenosis, asymptomatic  Plan: I have recommended carotid endarterectomy for reduction of stroke risk. I explained the procedure with an expected one night hospitalization. I did explain the 1-2% risk of stroke with surgery and also low risk of cranial nerve injury. The patient understands wish to proceed. We'll schedule her surgery for 06/09/2011. She does have known aortic valvular stenosis which is asymptomatic and will be followed by Dr. Alanda Amass  Nicole Mercado 5/28/201310:38 AM

## 2011-06-04 ENCOUNTER — Encounter (HOSPITAL_COMMUNITY): Payer: Self-pay | Admitting: *Deleted

## 2011-06-04 ENCOUNTER — Encounter (HOSPITAL_COMMUNITY): Payer: Self-pay | Admitting: Pharmacy Technician

## 2011-06-04 ENCOUNTER — Other Ambulatory Visit: Payer: Self-pay

## 2011-06-04 NOTE — Pre-Procedure Instructions (Signed)
20 Nicole Mercado   06/04/2011   Your procedure is scheduled on:  Monday, June 3rd  Report to [facility] at  5:30  AM.  Call this number if you have problems the morning of surgery: [phone]   Remember:   Do not eat food:After Midnight Sunday.  May have clear liquids: up to 4 Hours before arrival time -- 1:30AM.  Clear liquids include soda, tea, black coffee, apple or grape juice, broth.   Take these medicines the morning of surgery with A SIP OF WATER: Baby aspirin,              lopressor   Do not wear jewelry, make-up or nail polish.   Do not wear lotions, powders, or perfumes. You may wear deodorant.   Do not shave 48 hours prior to surgery. Men may shave face and neck.  Do not bring valuables to the hospital.  Contacts, dentures or bridgework may not be worn into surgery.  Leave suitcase in the car. After surgery it may be brought to your room.  For patients admitted to the hospital, checkout time is 11:00 AM the day of discharge.   Patients discharged the day of surgery will not be allowed to drive home.  Name and phone number of your driver:    Special Instructions: CHG Shower Use Special Wash: 1/2 bottle night before surgery and 1/2 bottle morning of surgery.   Please read over the following fact sheets that you were given: Pain Booklet, Coughing and Deep Breathing, Blood Transfusion Information, MRSA Information and Surgical Site Infection Prevention

## 2011-06-05 NOTE — Pre-Procedure Instructions (Signed)
20 Nicole Mercado  06/05/2011   Your procedure is scheduled on:  Mon, June 3 @ 7:30 AM  Report to [facility] at 5:30 AM  Call this number if you have problems the morning of surgery: [phone]   Remember:   Do not eat food:After Midnight.  May have clear liquids: up to 4 Hours before arrival.(until 1:30 AM)  Clear liquids include soda, tea, black coffee, apple or grape juice, broth,water  Take these medicines the morning of surgery with A SIP OF WATER: Metoprolol(Lopressor)   Do not wear jewelry, make-up or nail polish.  Do not wear lotions, powders, or perfumes.   Do not shave 48 hours prior to surgery.   Do not bring valuables to the hospital.  Contacts, dentures or bridgework may not be worn into surgery.  Leave suitcase in the car. After surgery it may be brought to your room.  For patients admitted to the hospital, checkout time is 11:00 AM the day of discharge.   Special Instructions: CHG Shower Use Special Wash: 1/2 bottle night before surgery and 1/2 bottle morning of surgery.   Please read over the following fact sheets that you were given: Pain Booklet, Coughing and Deep Breathing, MRSA Information and Surgical Site Infection Prevention

## 2011-06-06 ENCOUNTER — Encounter (HOSPITAL_COMMUNITY)
Admission: RE | Admit: 2011-06-06 | Discharge: 2011-06-06 | Disposition: A | Discharge status: Home / Self Care | Payer: Medicare Other | Source: Ambulatory Visit | Attending: Vascular Surgery | Admitting: Vascular Surgery

## 2011-06-06 LAB — URINE MICROSCOPIC-ADD ON

## 2011-06-06 LAB — TYPE AND SCREEN
ABO/RH(D): O POS
Antibody Screen: NEGATIVE

## 2011-06-06 LAB — COMPREHENSIVE METABOLIC PANEL
Albumin: 3.9 g/dL (ref 3.5–5.2)
Alkaline Phosphatase: 58 U/L (ref 39–117)
BUN: 16 mg/dL (ref 6–23)
Creatinine, Ser: 0.79 mg/dL (ref 0.50–1.10)
GFR calc Af Amer: >90 mL/min (ref >90)
Glucose, Bld: 93 mg/dL (ref 70–99)
Total Protein: 6.9 g/dL (ref 6.0–8.3)

## 2011-06-06 LAB — URINALYSIS, ROUTINE W REFLEX MICROSCOPIC
Glucose, UA: NEGATIVE mg/dL
Ketones, ur: NEGATIVE mg/dL
Leukocytes, UA: NEGATIVE
Protein, ur: NEGATIVE mg/dL
Urobilinogen, UA: 0.2 mg/dL (ref 0.0–1.0)

## 2011-06-06 LAB — CBC
HCT: 37.2 % (ref 36.0–46.0)
Hemoglobin: 12.8 g/dL (ref 12.0–15.0)
MCH: 34.7 pg — ABNORMAL HIGH (ref 26.0–34.0)
MCHC: 34.4 g/dL (ref 30.0–36.0)
MCV: 100.8 fL — ABNORMAL HIGH (ref 78.0–100.0)
RDW: 13.9 % (ref 11.5–15.5)

## 2011-06-06 LAB — APTT: aPTT: 26 seconds (ref 24–37)

## 2011-06-06 LAB — PROTIME-INR: INR: 0.99 (ref 0.00–1.49)

## 2011-06-06 LAB — ABO/RH: ABO/RH(D): O POS

## 2011-06-08 MED ORDER — DEXTROSE 5 % IV SOLN
1.5000 g | INTRAVENOUS | Status: AC
  Administered 2011-06-09: 1.5 g via INTRAVENOUS
  Filled 2011-06-08: qty 1.5

## 2011-06-09 ENCOUNTER — Inpatient Hospital Stay (HOSPITAL_COMMUNITY)
Admission: RE | Admit: 2011-06-09 | Discharge: 2011-06-10 | DRG: 039 | Disposition: A | Discharge status: Home / Self Care | Payer: Medicare Other | Source: Ambulatory Visit | Attending: Vascular Surgery | Admitting: Vascular Surgery

## 2011-06-09 ENCOUNTER — Encounter (HOSPITAL_COMMUNITY)
Admission: RE | Disposition: A | Discharge status: Home / Self Care | Payer: Self-pay | Source: Ambulatory Visit | Attending: Vascular Surgery

## 2011-06-09 ENCOUNTER — Ambulatory Visit (HOSPITAL_COMMUNITY): Payer: Medicare Other | Admitting: Certified Registered"

## 2011-06-09 ENCOUNTER — Encounter (HOSPITAL_COMMUNITY): Payer: Self-pay | Admitting: *Deleted

## 2011-06-09 ENCOUNTER — Encounter (HOSPITAL_COMMUNITY): Payer: Self-pay | Admitting: Certified Registered"

## 2011-06-09 DIAGNOSIS — Z01812 Encounter for preprocedural laboratory examination: Secondary | ICD-10-CM

## 2011-06-09 DIAGNOSIS — I6529 Occlusion and stenosis of unspecified carotid artery: Principal | ICD-10-CM | POA: Diagnosis present

## 2011-06-09 DIAGNOSIS — I10 Essential (primary) hypertension: Secondary | ICD-10-CM | POA: Diagnosis present

## 2011-06-09 HISTORY — DX: Acute pancreatitis without necrosis or infection, unspecified: K85.90

## 2011-06-09 HISTORY — PX: CAROTID ENDARTERECTOMY: SUR193

## 2011-06-09 HISTORY — DX: Unspecified osteoarthritis, unspecified site: M19.90

## 2011-06-09 HISTORY — PX: ENDARTERECTOMY: SHX5162

## 2011-06-09 SURGERY — ENDARTERECTOMY, CAROTID
Anesthesia: General | Site: Neck | Laterality: Left | Wound class: Clean

## 2011-06-09 MED ORDER — PHENOL 1.4 % MT LIQD: 1.0000 | OROMUCOSAL | Status: DC | PRN

## 2011-06-09 MED ORDER — SODIUM CHLORIDE 0.9 % IV SOLN
INTRAVENOUS | Status: DC
  Administered 2011-06-09: 16:00:00 via INTRAVENOUS

## 2011-06-09 MED ORDER — GLYCOPYRROLATE 0.2 MG/ML IJ SOLN
INTRAMUSCULAR | Status: DC | PRN
  Administered 2011-06-09: .4 mg via INTRAVENOUS
  Administered 2011-06-09: 0.2 mg via INTRAVENOUS

## 2011-06-09 MED ORDER — 0.9 % SODIUM CHLORIDE (POUR BTL) OPTIME
TOPICAL | Status: DC | PRN
  Administered 2011-06-09: 2000 mL

## 2011-06-09 MED ORDER — PANTOPRAZOLE SODIUM 40 MG PO TBEC
40.0000 mg | DELAYED_RELEASE_TABLET | Freq: Every day | ORAL | Status: DC
  Administered 2011-06-09: 40 mg via ORAL
  Filled 2011-06-09: qty 1

## 2011-06-09 MED ORDER — GUAIFENESIN-DM 100-10 MG/5ML PO SYRP: 15.0000 mL | ORAL_SOLUTION | ORAL | Status: DC | PRN

## 2011-06-09 MED ORDER — DOPAMINE-DEXTROSE 3.2-5 MG/ML-% IV SOLN: 3.0000 ug/kg/min | INTRAVENOUS | Status: DC

## 2011-06-09 MED ORDER — LACTATED RINGERS IV SOLN
INTRAVENOUS | Status: DC | PRN
  Administered 2011-06-09 (×2): via INTRAVENOUS

## 2011-06-09 MED ORDER — LISINOPRIL 20 MG PO TABS
20.0000 mg | ORAL_TABLET | Freq: Every day | ORAL | Status: DC
Start: 2011-06-09 — End: 2011-06-10
  Administered 2011-06-10: 20 mg via ORAL
  Filled 2011-06-09 (×2): qty 1

## 2011-06-09 MED ORDER — HYDRALAZINE HCL 20 MG/ML IJ SOLN: 10.0000 mg | INTRAMUSCULAR | Status: DC | PRN

## 2011-06-09 MED ORDER — SENNOSIDES-DOCUSATE SODIUM 8.6-50 MG PO TABS
1.0000 | ORAL_TABLET | Freq: Every evening | ORAL | Status: DC | PRN
  Filled 2011-06-09: qty 1

## 2011-06-09 MED ORDER — HYDROMORPHONE HCL PF 1 MG/ML IJ SOLN
0.2500 mg | INTRAMUSCULAR | Status: DC | PRN
  Administered 2011-06-09 (×3): 0.25 mg via INTRAVENOUS

## 2011-06-09 MED ORDER — SODIUM CHLORIDE 0.9 % IV SOLN: INTRAVENOUS | Status: DC

## 2011-06-09 MED ORDER — METOPROLOL TARTRATE 1 MG/ML IV SOLN: 2.0000 mg | INTRAVENOUS | Status: DC | PRN

## 2011-06-09 MED ORDER — MAGNESIUM SULFATE 40 MG/ML IJ SOLN
2.0000 g | Freq: Once | INTRAMUSCULAR | Status: AC | PRN
  Filled 2011-06-09: qty 50

## 2011-06-09 MED ORDER — DEXTROSE 5 % IV SOLN
1.5000 g | Freq: Two times a day (BID) | INTRAVENOUS | Status: AC
  Administered 2011-06-09 – 2011-06-10 (×2): 1.5 g via INTRAVENOUS
  Filled 2011-06-09 (×2): qty 1.5

## 2011-06-09 MED ORDER — NEOSTIGMINE METHYLSULFATE 1 MG/ML IJ SOLN
INTRAMUSCULAR | Status: DC | PRN
  Administered 2011-06-09: 2 mg via INTRAVENOUS

## 2011-06-09 MED ORDER — HYDROMORPHONE HCL PF 1 MG/ML IJ SOLN
INTRAMUSCULAR | Status: AC
  Filled 2011-06-09: qty 1

## 2011-06-09 MED ORDER — MORPHINE SULFATE 2 MG/ML IJ SOLN
2.0000 mg | INTRAMUSCULAR | Status: DC | PRN
  Administered 2011-06-09: 2 mg via INTRAVENOUS
  Filled 2011-06-09: qty 1

## 2011-06-09 MED ORDER — SODIUM CHLORIDE 0.9 % IR SOLN
Status: DC | PRN
  Administered 2011-06-09: 09:00:00

## 2011-06-09 MED ORDER — POTASSIUM CHLORIDE CRYS ER 20 MEQ PO TBCR: 20.0000 meq | EXTENDED_RELEASE_TABLET | Freq: Once | ORAL | Status: AC | PRN

## 2011-06-09 MED ORDER — PROTAMINE SULFATE 10 MG/ML IV SOLN
INTRAVENOUS | Status: DC | PRN
  Administered 2011-06-09 (×2): 20 mg via INTRAVENOUS
  Administered 2011-06-09: 10 mg via INTRAVENOUS

## 2011-06-09 MED ORDER — ESTRADIOL 0.05 MG/24HR TD PTWK
0.0500 mg | MEDICATED_PATCH | TRANSDERMAL | Status: DC
  Administered 2011-06-10: 0.05 mg via TRANSDERMAL
  Filled 2011-06-09: qty 1

## 2011-06-09 MED ORDER — ACETAMINOPHEN 650 MG RE SUPP: 325.0000 mg | RECTAL | Status: DC | PRN

## 2011-06-09 MED ORDER — ACETAMINOPHEN 325 MG PO TABS: 325.0000 mg | ORAL_TABLET | ORAL | Status: DC | PRN

## 2011-06-09 MED ORDER — SODIUM CHLORIDE 0.9 % IV SOLN
10.0000 mg | INTRAVENOUS | Status: DC | PRN
  Administered 2011-06-09: 50 ug/min via INTRAVENOUS

## 2011-06-09 MED ORDER — SODIUM CHLORIDE 0.9 % IV SOLN: 500.0000 mL | Freq: Once | INTRAVENOUS | Status: AC | PRN

## 2011-06-09 MED ORDER — METOPROLOL TARTRATE 50 MG PO TABS
50.0000 mg | ORAL_TABLET | Freq: Two times a day (BID) | ORAL | Status: DC
  Administered 2011-06-09 – 2011-06-10 (×2): 50 mg via ORAL
  Filled 2011-06-09 (×3): qty 1

## 2011-06-09 MED ORDER — BISACODYL 5 MG PO TBEC: 5.0000 mg | DELAYED_RELEASE_TABLET | Freq: Every day | ORAL | Status: DC | PRN

## 2011-06-09 MED ORDER — ONDANSETRON HCL 4 MG/2ML IJ SOLN
INTRAMUSCULAR | Status: DC | PRN
  Administered 2011-06-09: 4 mg via INTRAVENOUS

## 2011-06-09 MED ORDER — ASPIRIN EC 325 MG PO TBEC: 325.0000 mg | DELAYED_RELEASE_TABLET | Freq: Every day | ORAL | Status: DC

## 2011-06-09 MED ORDER — OXYCODONE HCL 5 MG PO TABS
5.0000 mg | ORAL_TABLET | ORAL | Status: DC | PRN
  Administered 2011-06-10: 5 mg via ORAL
  Filled 2011-06-09: qty 1

## 2011-06-09 MED ORDER — DOCUSATE SODIUM 100 MG PO CAPS
100.0000 mg | ORAL_CAPSULE | Freq: Every day | ORAL | Status: DC
  Administered 2011-06-10: 100 mg via ORAL
  Filled 2011-06-09: qty 1

## 2011-06-09 MED ORDER — ASPIRIN EC 81 MG PO TBEC: 81.0000 mg | DELAYED_RELEASE_TABLET | Freq: Every day | ORAL | Status: DC

## 2011-06-09 MED ORDER — ONDANSETRON HCL 4 MG/2ML IJ SOLN: 4.0000 mg | Freq: Four times a day (QID) | INTRAMUSCULAR | Status: DC | PRN

## 2011-06-09 MED ORDER — ALBUMIN HUMAN 5 % IV SOLN
12.5000 g | Freq: Once | INTRAVENOUS | Status: AC
  Administered 2011-06-09: 12.5 g via INTRAVENOUS

## 2011-06-09 MED ORDER — ASPIRIN EC 325 MG PO TBEC
325.0000 mg | DELAYED_RELEASE_TABLET | Freq: Every day | ORAL | Status: DC
  Administered 2011-06-10: 325 mg via ORAL
  Filled 2011-06-09: qty 1

## 2011-06-09 MED ORDER — HEPARIN SODIUM (PORCINE) 1000 UNIT/ML IJ SOLN
INTRAMUSCULAR | Status: DC | PRN
  Administered 2011-06-09: 5000 [IU] via INTRAVENOUS

## 2011-06-09 MED ORDER — LIDOCAINE HCL (CARDIAC) 20 MG/ML IV SOLN
INTRAVENOUS | Status: DC | PRN
  Administered 2011-06-09: 60 mg via INTRAVENOUS

## 2011-06-09 MED ORDER — LABETALOL HCL 5 MG/ML IV SOLN: 10.0000 mg | INTRAVENOUS | Status: DC | PRN

## 2011-06-09 MED ORDER — ALUM & MAG HYDROXIDE-SIMETH 200-200-20 MG/5ML PO SUSP: 15.0000 mL | ORAL | Status: DC | PRN

## 2011-06-09 MED ORDER — VECURONIUM BROMIDE 10 MG IV SOLR
INTRAVENOUS | Status: DC | PRN
  Administered 2011-06-09: 6 mg via INTRAVENOUS

## 2011-06-09 MED ORDER — PROPOFOL 10 MG/ML IV EMUL
INTRAVENOUS | Status: DC | PRN
  Administered 2011-06-09: 150 mg via INTRAVENOUS

## 2011-06-09 MED ORDER — FENTANYL CITRATE 0.05 MG/ML IJ SOLN
INTRAMUSCULAR | Status: DC | PRN
  Administered 2011-06-09: 25 ug via INTRAVENOUS
  Administered 2011-06-09 (×4): 50 ug via INTRAVENOUS

## 2011-06-09 SURGICAL SUPPLY — 48 items
APL SKNCLS STERI-STRIP NONHPOA (GAUZE/BANDAGES/DRESSINGS) ×1
BENZOIN TINCTURE PRP APPL 2/3 (GAUZE/BANDAGES/DRESSINGS) ×2 IMPLANT
CANISTER SUCTION 2500CC (MISCELLANEOUS) ×2 IMPLANT
CATH ROBINSON RED A/P 18FR (CATHETERS) ×2 IMPLANT
CLIP LIGATING EXTRA MED SLVR (CLIP) ×2 IMPLANT
CLIP LIGATING EXTRA SM BLUE (MISCELLANEOUS) ×2 IMPLANT
CLOTH BEACON ORANGE TIMEOUT ST (SAFETY) ×2 IMPLANT
CLSR STERI-STRIP ANTIMIC 1/2X4 (GAUZE/BANDAGES/DRESSINGS) ×1 IMPLANT
COVER PROBE W GEL 5X96 (DRAPES) ×1 IMPLANT
COVER SURGICAL LIGHT HANDLE (MISCELLANEOUS) ×4 IMPLANT
CRADLE DONUT ADULT HEAD (MISCELLANEOUS) ×2 IMPLANT
DECANTER SPIKE VIAL GLASS SM (MISCELLANEOUS) IMPLANT
DRAIN HEMOVAC 1/8 X 5 (WOUND CARE) IMPLANT
DRAPE WARM FLUID 44X44 (DRAPE) ×2 IMPLANT
DRSG COVADERM 4X6 (GAUZE/BANDAGES/DRESSINGS) ×1 IMPLANT
ELECT REM PT RETURN 9FT ADLT (ELECTROSURGICAL) ×2
ELECTRODE REM PT RTRN 9FT ADLT (ELECTROSURGICAL) ×1 IMPLANT
EVACUATOR SILICONE 100CC (DRAIN) IMPLANT
GEL ULTRASOUND 20GR AQUASONIC (MISCELLANEOUS) IMPLANT
GLOVE BIO SURGEON STRL SZ 6.5 (GLOVE) ×2 IMPLANT
GLOVE BIOGEL PI IND STRL 6.5 (GLOVE) IMPLANT
GLOVE BIOGEL PI IND STRL 7.0 (GLOVE) IMPLANT
GLOVE BIOGEL PI INDICATOR 6.5 (GLOVE) ×2
GLOVE BIOGEL PI INDICATOR 7.0 (GLOVE) ×2
GLOVE SS BIOGEL STRL SZ 7.5 (GLOVE) ×1 IMPLANT
GLOVE SUPERSENSE BIOGEL SZ 7.5 (GLOVE) ×1
GOWN STRL NON-REIN LRG LVL3 (GOWN DISPOSABLE) ×6 IMPLANT
KIT BASIN OR (CUSTOM PROCEDURE TRAY) ×2 IMPLANT
KIT ROOM TURNOVER OR (KITS) ×2 IMPLANT
NEEDLE 22X1 1/2 (OR ONLY) (NEEDLE) IMPLANT
NS IRRIG 1000ML POUR BTL (IV SOLUTION) ×4 IMPLANT
PACK CAROTID (CUSTOM PROCEDURE TRAY) ×2 IMPLANT
PAD ARMBOARD 7.5X6 YLW CONV (MISCELLANEOUS) ×4 IMPLANT
PATCH HEMASHIELD 8X75 (Vascular Products) ×1 IMPLANT
SHUNT CAROTID BYPASS 10 (VASCULAR PRODUCTS) ×1 IMPLANT
SHUNT CAROTID BYPASS 12FRX15.5 (VASCULAR PRODUCTS) IMPLANT
SPECIMEN JAR SMALL (MISCELLANEOUS) ×2 IMPLANT
STRIP CLOSURE SKIN 1/2X4 (GAUZE/BANDAGES/DRESSINGS) ×2 IMPLANT
SUT ETHILON 3 0 PS 1 (SUTURE) IMPLANT
SUT PROLENE 6 0 CC (SUTURE) ×4 IMPLANT
SUT VIC AB 3-0 SH 27 (SUTURE) ×4
SUT VIC AB 3-0 SH 27X BRD (SUTURE) ×2 IMPLANT
SUT VICRYL 4-0 PS2 18IN ABS (SUTURE) ×2 IMPLANT
SYR CONTROL 10ML LL (SYRINGE) IMPLANT
TOWEL OR 17X24 6PK STRL BLUE (TOWEL DISPOSABLE) ×2 IMPLANT
TOWEL OR 17X26 10 PK STRL BLUE (TOWEL DISPOSABLE) ×2 IMPLANT
TRAY FOLEY CATH 14FRSI W/METER (CATHETERS) ×1 IMPLANT
WATER STERILE IRR 1000ML POUR (IV SOLUTION) ×2 IMPLANT

## 2011-06-09 NOTE — Progress Notes (Signed)
Utilization review completed.  

## 2011-06-09 NOTE — Interval H&P Note (Signed)
History and Physical Interval Note:  06/09/2011 7:22 AM  Nicole Mercado  has presented today for surgery, with the diagnosis of Left Internal Carotid Artery Stenosis  The various methods of treatment have been discussed with the patient and family. After consideration of risks, benefits and other options for treatment, the patient has consented to  Procedure(s) (LRB): ENDARTERECTOMY CAROTID (Left) as a surgical intervention .  The patients' history has been reviewed, patient examined, no change in status, stable for surgery.  I have reviewed the patients' chart and labs.  Questions were answered to the patient's satisfaction.     Jarvis Sawa

## 2011-06-09 NOTE — Progress Notes (Signed)
Arterial line removed intact, no issues.  Pressure held for 20 minutes, noted a small amount of bleeding continue, held pressure for another 10 minutes.  Site no longer bleeding, dressed with pressure dressing.

## 2011-06-09 NOTE — Progress Notes (Signed)
Dr. Chaney Malling called and informed of pts low BP,  Already giving NS bolus, per Dr. Bosie Helper order.  Order obtained for additional 5% albumin bolus, if the NS bolus is not effective.

## 2011-06-09 NOTE — Anesthesia Postprocedure Evaluation (Signed)
Anesthesia Post Note  Patient: Nicole Mercado  Procedure(s) Performed: Procedure(s) (LRB): ENDARTERECTOMY CAROTID (Left)  Anesthesia type: General  Patient location: PACU  Post pain: Pain level controlled and Adequate analgesia  Post assessment: Post-op Vital signs reviewed, Patient's Cardiovascular Status Stable, Respiratory Function Stable, Patent Airway and Pain level controlled  Last Vitals:  Filed Vitals:   06/09/11 1130  BP:   Pulse: 54  Temp:   Resp: 13    Post vital signs: Reviewed and stable  Level of consciousness: awake, alert  and oriented  Complications: No apparent anesthesia complications

## 2011-06-09 NOTE — Anesthesia Procedure Notes (Signed)
Procedure Name: Intubation Date/Time: 06/09/2011 8:09 AM Performed by: Carmela Rima Pre-anesthesia Checklist: Emergency Drugs available, Patient identified, Timeout performed, Suction available and Patient being monitored Patient Re-evaluated:Patient Re-evaluated prior to inductionOxygen Delivery Method: Circle system utilized Preoxygenation: Pre-oxygenation with 100% oxygen Intubation Type: IV induction Ventilation: Mask ventilation without difficulty Laryngoscope Size: Mac and 3 Grade View: Grade I Tube size: 7.5 mm Number of attempts: 1 Placement Confirmation: ETT inserted through vocal cords under direct vision,  breath sounds checked- equal and bilateral and positive ETCO2 Secured at: 21 cm Tube secured with: Tape Dental Injury: Teeth and Oropharynx as per pre-operative assessment

## 2011-06-09 NOTE — Transfer of Care (Signed)
Immediate Anesthesia Transfer of Care Note  Patient: Nicole Mercado  Procedure(s) Performed: Procedure(s) (LRB): ENDARTERECTOMY CAROTID (Left)  Patient Location: PACU  Anesthesia Type: General  Level of Consciousness: awake, alert  and oriented  Airway & Oxygen Therapy: Patient Spontanous Breathing and Patient connected to nasal cannula oxygen  Post-op Assessment: Report given to PACU RN, Post -op Vital signs reviewed and stable, Patient moving all extremities X 4 and Patient able to stick tongue midline  Post vital signs: Reviewed and stable  Complications: No apparent anesthesia complications

## 2011-06-09 NOTE — Anesthesia Preprocedure Evaluation (Addendum)
Anesthesia Evaluation  Patient identified by MRN, date of birth, ID band Patient awake    Reviewed: Allergy & Precautions, H&P , NPO status , Patient's Chart, lab work & pertinent test results  Airway Mallampati: II  Neck ROM: full    Dental  (+) Dental Advidsory Given and Teeth Intact   Pulmonary          Cardiovascular hypertension,     Neuro/Psych    GI/Hepatic   Endo/Other    Renal/GU      Musculoskeletal  (+) Arthritis -,   Abdominal   Peds  Hematology Leukemia   Anesthesia Other Findings   Reproductive/Obstetrics                          Anesthesia Physical Anesthesia Plan  ASA: III  Anesthesia Plan: General   Post-op Pain Management:    Induction: Intravenous  Airway Management Planned: Oral ETT  Additional Equipment: Arterial line  Intra-op Plan:   Post-operative Plan: Extubation in OR  Informed Consent: I have reviewed the patients History and Physical, chart, labs and discussed the procedure including the risks, benefits and alternatives for the proposed anesthesia with the patient or authorized representative who has indicated his/her understanding and acceptance.   Dental Advisory Given  Plan Discussed with: CRNA, Surgeon and Anesthesiologist  Anesthesia Plan Comments:        Anesthesia Quick Evaluation

## 2011-06-09 NOTE — Preoperative (Signed)
Beta Blockers   Reason not to administer Beta Blockers:Not Applicable 

## 2011-06-09 NOTE — Progress Notes (Signed)
Noted small droop to left side of mouth,  Dr Early aware.

## 2011-06-09 NOTE — H&P (View-Only) (Signed)
[facility] [place]  Vascular Consult Note    Patient name: Nicole Mercado MRN: [id number] DOB: 08/26/1940 Sex: female   Referred by: Dr weintraub  Reason for referral: Severe asymptomatic left internal carotid artery stenosis  HISTORY OF PRESENT ILLNESS: Patient is a very pleasant active 70-year-old white female who was noted to have a carotid bruit and underwent carotid duplex for further evaluation. 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 She specifically denies any prior amaurosis fugax, transient ischemic attack, or stroke. She is right-handed. She does not have any strong risk factors. She is hypertensive but has never smoked. She does have significant aortic valvular stenosis. She is asymptomatic from this.  Past Medical History  Diagnosis Date  . Thrombocytopenia 11/19/2010  . Hypertension   . Leukemia     Past Surgical History  Procedure Date  . Cesarean section     FIVE  . Cholecystectomy   . Appendectomy   . Abdominal hysterectomy     History   Social History  . Marital Status: Legally Separated    Spouse Name: [name]    Number of Children: [name]  . Years of Education: [name]   Occupational History  . Not on file.   Social History Main Topics  . Smoking status: Never Smoker   . Smokeless tobacco: Never Used  . Alcohol Use: No  . Drug Use: No  . Sexually Active: Not on file   Other Topics Concern  . Not on file   Social History Narrative  . No narrative on file    Family History  Problem Relation Age of Onset  . Heart disease Mother   . Hypertension Mother   . Heart disease Father   . Hypertension Father     No Known Allergies  Prior to Admission medications   Medication Sig Start Date End Date Taking? Authorizing Provider  acetaminophen (TYLENOL) 500 MG tablet Take 1,000 mg by mouth every 6 (six) hours as needed. pain    Yes Historical Provider, MD   aspirin EC 81 MG tablet Take 81 mg by mouth daily.     Yes Historical Provider, MD  Calcium-Vitamin D 500-125 MG-UNIT TABS Take 2 tablets by mouth daily.    Yes Historical Provider, MD  Cyanocobalamin (VITAMIN B 12 PO) Take 1 tablet by mouth daily.    Yes Historical Provider, MD  estradiol (CLIMARA - DOSED IN MG/24 HR) 0.05 mg/24hr Place 1 patch onto the skin once a week.     Yes Historical Provider, MD  lisinopril (PRINIVIL,ZESTRIL) 20 MG tablet Take 20 mg by mouth daily.     Yes Historical Provider, MD  metoprolol (LOPRESSOR) 50 MG tablet Take 50 mg by mouth 2 (two) times daily.     Yes Historical Provider, MD  Multiple Vitamins-Minerals (MULTIVITAMINS THER. W/MINERALS) TABS Take 1 tablet by mouth daily.     Yes Historical Provider, MD     REVIEW OF SYSTEMS: Cardiovascular: No chest pain, chest pressure, palpitations, orthopnea, or dyspnea on exertion. No claudication or rest pain,  No history of DVT or phlebitis. Pulmonary: No productive cough, asthma or wheezing. Neurologic: No weakness, paresthesias, aphasia, or amaurosis. No dizziness. Hematologic: No bleeding problems or clotting disorders. Musculoskeletal: No joint pain or joint swelling. Gastrointestinal: No blood in stool or hematemesis Genitourinary: No dysuria or hematuria. Psychiatric:: No history of major depression. Integumentary: No rashes or ulcers. Constitutional: No fever or chills.  PHYSICAL EXAMINATION:    Filed Vitals:   06/03/11 0932  BP: 191/69  Pulse: 54  Resp:     General: The patient appears their stated age. Pulmonary: There is a good air exchange bilaterally without wheezing or rales. Abdomen: Soft and non-tender with normal pitch bowel sounds. Musculoskeletal: There are no major deformities.  There is no significant extremity pain. Neurologic: No focal weakness or paresthesias are detected, Skin: There are no ulcer or rashes noted. Psychiatric: The patient has normal affect. Cardiovascular: There is  a regular rate and rhythm with t significant murmur appreciated. The murmur is mainly systolic Carotids with the bruit bilaterally. His may be transmitted from her aortic valvular stenosis murmur Pulse status: 2+ radial femoral and dorsalis pedis pulses bilaterally  Diagnostic Studies: Carotid duplex in our office was done to assure her that she had normal internal carotid and confirm her degree of stenosis. This did show critical stenosis with end-diastolic velocities on the left internal carotid of 115 cm/s. The carotid does become normal distal to this.  Outside Studies/Documentation Historical records were reviewed.  They showed carotid duplex showed tear left carotid stenosis  Medication Changes: None  Assessment:  Severe greater than 80% left internal carotid artery stenosis, asymptomatic  Plan: I have recommended carotid endarterectomy for reduction of stroke risk. I explained the procedure with an expected one night hospitalization. I did explain the 1-2% risk of stroke with surgery and also low risk of cranial nerve injury. The patient understands wish to proceed. We'll schedule her surgery for 06/09/2011. She does have known aortic valvular stenosis which is asymptomatic and will be followed by Dr. Weintraub  Jakwan Sally 5/28/201310:38 AM   

## 2011-06-09 NOTE — Op Note (Signed)
[facility]  Patient name: Nicole Mercado MRN: [id number] DOB: 07-03-1940 Sex: female  06/09/2011 Pre-operative Diagnosis: Asymptomatic left carotid stenosis Post-operative diagnosis:  Same Surgeon:  Larina Earthly, M.D. Assistants:  Thomasena Edis Procedure:    left carotid Endarterectomy with Dacron patch angioplasty Anesthesia:  General Blood Loss:  See anesthesia record Specimens:  Carotid Plaque to pathology  Indications for surgery:  Asymptomatic carotid stenosis, left  Procedure in detail:  The patient was taken to the operating and placed in the supine position. The neck was prepped and draped in the usual sterile fashion. An incision was made anterior to the sternocleidomastoid muscle and continued with electrocautery through the platysma muscle. The muscle was retracted posteriorly and the carotid sheath was opened. The facial vein was ligated with 2-0 silk ties and divided. The common carotid artery was encircled with an umbilical tape and Rummel tourniquet. Dissection was continued onto the carotid bifurcation. The superior thyroid artery was controlled with a 2-0 silk Potts tie. The external carotid organ was encircled with a vessel loop and the internal carotid was encircled with umbilical tape and Rummel tourniquet. The hypoglossal and vagus nerves were identified and preserved.  The patient was given systemic heparinization. After adequate circulation time, the internal,external and common carotid arteries were occluded. The common carotid was opened with an 11 blade and the arteriotomy was continued with Potts scissors onto the internal carotid artery. A 10 shunt was passed up the internal carotid artery, allowed to back bleed, and then passed down the common carotid artery. The shunt was secured with Rummel tourniquet. The endarterectomy was begun on the common carotid artery  plaque was divided proximally with Potts scissors. The endarterectomy was continued  onto the carotid bifurcation. The external carotid was endarterectomized by eversion technique and the internal carotid artery was endarterectomized in an open fashion. Remaining debris was removed from the endarterectomy plane. A Dacron patch was brought to the field and sewn as a patch angioplasty. Prior to completing the anastomosis, the shunt was removed and the usual flushing maneuvers were undertaken. The anastomosis was then completed and flow was restored first to the external and then the internal carotid artery. Excellent flow characteristics were noted with hand-held Doppler in the internal and external carotid arteries.  The patient was given protamine to reverse the heparin. Hemostasis was obtained with electrocautery. The wounds were irrigated with saline. The wound was closed by first reapproximating the sternocleidomastoid muscle over the carotid artery with interrupted 3-0 Vicryl sutures. Next, the platysma was closed with a running 3-0 Vicryl suture. The skin was closed with a 4-0 subcuticular Vicryl suture. Benzoin and Steri-Strips were applied to the incision. A sterile dressing was placed over the incision. All sponge and needle counts were correct. The patient was awakened in the operating room, neurologically intact. They were transferred to the PACU in stable condition.   Disposition:  To PACU in stable condition,neurologically intact  Relevant Operative Details:  Plaque located at the bifurcation with normal internal carotid distally  Larina Earthly, M.D. [facility] [place] Office: [phone] Pager:  [phone]

## 2011-06-10 ENCOUNTER — Encounter (HOSPITAL_COMMUNITY): Payer: Self-pay | Admitting: Vascular Surgery

## 2011-06-10 ENCOUNTER — Telehealth: Payer: Self-pay | Admitting: Vascular Surgery

## 2011-06-10 LAB — CBC
MCH: 34.4 pg — ABNORMAL HIGH (ref 26.0–34.0)
Platelets: 65 10*3/uL — ABNORMAL LOW (ref 150–400)
RBC: 2.56 MIL/uL — ABNORMAL LOW (ref 3.87–5.11)

## 2011-06-10 LAB — BASIC METABOLIC PANEL
CO2: 24 mEq/L (ref 19–32)
Calcium: 8.6 mg/dL (ref 8.4–10.5)
GFR calc non Af Amer: 85 mL/min — ABNORMAL LOW (ref >90)
Sodium: 137 mEq/L (ref 135–145)

## 2011-06-10 MED ORDER — OXYCODONE HCL 5 MG PO TABS: 5.0000 mg | ORAL_TABLET | Freq: Four times a day (QID) | ORAL | Status: AC | PRN

## 2011-06-10 NOTE — Care Management Note (Signed)
    Page 1 of 1   06/10/2011     11:28:45 AM   CARE MANAGEMENT NOTE 06/10/2011  Patient:  Nicole Mercado, Nicole Mercado   Account Number:  [id number]  Date Initiated:  06/09/2011  Documentation initiated by:  Donn Pierini  Subjective/Objective Assessment:   Pt admitted s/p carotid     Action/Plan:   PTA pt lived at home, independent with ADLs   Anticipated DC Date:  06/10/2011   Anticipated DC Plan:  HOME/SELF CARE      DC Planning Services  CM consult      Choice offered to / List presented to:             Status of service:  Completed, signed off Medicare Important Message given?   (If response is "NO", the following Medicare IM given date fields will be blank) Date Medicare IM given:   Date Additional Medicare IM given:    Discharge Disposition:  HOME/SELF CARE  Per UR Regulation:  Reviewed for med. necessity/level of care/duration of stay  If discussed at Long Length of Stay Meetings, dates discussed:    Comments:  PCP- Yetta Barre  06/10/11- 1100- Donn Pierini RN, BSN [phone] Pt for discharge home today, no needs.  06/09/11- 1700- Donn Pierini RN, BSN [phone] UR completed- pt admitted from PACU- anticpated d/c in am.

## 2011-06-10 NOTE — Telephone Encounter (Addendum)
Message copied by Rosalyn Charters on Tue Jun 10, 2011 12:10 PM ------      Message from: Melene Plan      Created: Tue Jun 10, 2011 11:36 AM       DR TFE SAID 1 MONTH      ----- Message -----         From: Lars Mage, PA         Sent: 06/10/2011   7:31 AM           To: Melene Plan, RN            Left carotid endarterectomy f/u 2-3 weeks with Dr. Arbie Cookey   NOTIFIED PT. OF FU APPT. ON 06-24-11 3:30

## 2011-06-10 NOTE — Discharge Summary (Signed)
[facility] Specialists Discharge Summary   Patient ID:  Nicole Mercado MRN: [id number] DOB/AGE: [birth year] 71 y.o.  Admit date: 06/09/2011 Discharge date: 06/10/2011 Date of Surgery: 06/09/2011 Surgeon: Surgeon(s): Larina Earthly, MD  Admission Diagnosis: Left Internal Carotid Artery Stenosis  Discharge Diagnoses:  Left Internal Carotid Artery Stenosis  Secondary Diagnoses: Past Medical History  Diagnosis Date  . Thrombocytopenia 11/19/2010  . Hypertension   . 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 Arthritis   . Heart murmur   . Pancreatitis     h/o    Procedure(s): ENDARTERECTOMY CAROTID  Discharged Condition: good  HPI:  Patient is a very pleasant active 71 year old white female who was noted to have a carotid bruit and underwent carotid duplex for further evaluation. 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 She specifically denies any prior amaurosis fugax, transient ischemic attack, or stroke. She is right-handed. She does not have any strong risk factors. She is hypertensive but has never smoked. She does have significant aortic valvular stenosis. She is asymptomatic from this.   Hospital Course:  Nicole Mercado is a 71 y.o. female is S/P Left Procedure(s): ENDARTERECTOMY CAROTID Extubated: POD # 0 Post-op wounds clean, dry, intact or healing well Pt. Ambulating, voiding and taking PO diet without difficulty. Pt pain controlled with PO pain meds. Labs as below Complications: Left Marginal mandibular nerve palsy.      Consults:     Significant Diagnostic Studies: CBC Lab Results  Component Value Date   WBC 14.2* 06/10/2011   HGB 8.8* 06/10/2011   HCT 25.7* 06/10/2011   MCV 100.4* 06/10/2011   PLT 65* 06/10/2011    BMET    Component Value Date/Time   NA 137 06/10/2011 0447   K 4.1 06/10/2011 0447   CL 105 06/10/2011 0447   CO2 24 06/10/2011 0447   GLUCOSE 99 06/10/2011 0447   BUN 8 06/10/2011 0447   CREATININE 0.72 06/10/2011 0447   CALCIUM 8.6 06/10/2011 0447   GFRNONAA 85* 06/10/2011 0447   GFRAA >90 06/10/2011 0447   COAG Lab Results  Component Value Date   INR 0.99 06/06/2011   INR 1.03 11/11/2010     Disposition:  Discharge to :Home Discharge Orders    Future Appointments: Provider: Department: Dept Phone: Center:   06/26/2011 1:30 PM Delight Ovens, MD [facility] [phone] [facility]   07/25/2011 8:30 AM Krista Blue [facility] [phone] None   07/25/2011 9:00 AM Samul Dada, MD [facility] [phone] None     Future Orders Please Complete By Expires   Resume previous diet      Driving Restrictions      Comments:   No driving for 2 weeks   Lifting restrictions      Comments:   No lifting for 6 weeks   Call MD for:  temperature >100.5      Call MD for:  redness, tenderness, or signs of infection (pain, swelling, bleeding, redness, odor or green/yellow discharge around incision site)      Call MD for:  severe or increased pain, loss or decreased feeling  in affected limb(s)      Increase activity slowly      Comments:   Walk with assistance use walker or cane as needed   May shower       Scheduling Instructions:   May shower in 48 hours from surgery.  Avila, Albritton  Home Medication Instructions [id number]   Printed on:06/10/11 8119  Medication Information                    lisinopril (PRINIVIL,ZESTRIL) 20 MG tablet Take 20 mg by mouth daily.             Multiple Vitamins-Minerals (MULTIVITAMINS THER. W/MINERALS) TABS Take 1 tablet by mouth daily.             Calcium-Vitamin D 500-125 MG-UNIT TABS Take 2 tablets by mouth daily.            Cyanocobalamin (VITAMIN B 12 PO) Take 1 tablet by mouth daily.            aspirin EC 81 MG tablet Take 81 mg by mouth daily.             acetaminophen (TYLENOL) 500 MG tablet Take 1,000 mg by mouth every 6 (six) hours as needed. pain            estradiol (CLIMARA - DOSED  IN MG/24 HR) 0.05 mg/24hr Place 1 patch onto the skin once a week.             metoprolol (LOPRESSOR) 50 MG tablet Take 50 mg by mouth 2 (two) times daily.             oxyCODONE (OXY IR/ROXICODONE) 5 MG immediate release tablet Take 1-2 tablets (5-10 mg total) by mouth every 6 (six) hours as needed.            Verbal and written Discharge instructions given to the patient. Wound care per Discharge AVS Follow-up Information    Follow up with Gwenyth Dingee, MD in 2 weeks.   Contact information:   [address] [place] [address] [phone]          Signed: Clinton Gallant [facility] 06/10/2011, 7:33 AM

## 2011-06-10 NOTE — Procedures (Unsigned)
CAROTID DUPLEX EXAM  INDICATION:  Carotid artery stenosis.  HISTORY: Diabetes:  No Cardiac:  Aortic valve stenosis Hypertension:  Yes Smoking:  No Previous Surgery:  No CV History: Amaurosis Fugax No, Paresthesias No, Hemiparesis No                                      RIGHT             LEFT Brachial systolic pressure:         210               200 Brachial Doppler waveforms:         Triphasic         Triphasic Vertebral direction of flow:        Antegrade         Antegrade DUPLEX VELOCITIES (cm/sec) CCA peak systolic                   56                75 ECA peak systolic                   87                98 ICA peak systolic                   115               396 ICA end diastolic                   28                115 PLAQUE MORPHOLOGY:                  Heterogeneous     Heterogeneous PLAQUE AMOUNT:                      Mild to moderate  Moderate to severe PLAQUE LOCATION:                    CCA/ICA/ECA       CCA/ICA/ECA.  DUPLEX IMAGING:  The right external carotid artery is very small in caliber  IMPRESSION: 1. 20%-39% right internal carotid artery stenosis. 2. 80%-99% left  bifurcation stenosis with calcified shadowing plaque     in the proximal left internal carotid artery.  The proximal left     internal carotid artery is bulbous. 3. Small atrophic right external carotid artery.  ___________________________________________ Larina Earthly, M.D.  CI/MEDQ  D:  06/03/2011  T:  06/03/2011  Job:  [id number]

## 2011-06-10 NOTE — Progress Notes (Signed)
Discharge orders given to pt, and explained nodded understanding.  No questions, no complaints. NSL dc'd intact. Site unremarkable

## 2011-06-10 NOTE — Progress Notes (Addendum)
VASCULAR AND VEIN SURGERY POST - OP CEA PROGRESS NOTE  Date of Surgery: 06/09/2011 Surgeon: Surgeon(s): Larina Earthly, MD 1 Day Post-Op left Carotid Endarterectomy .  HPI: Nicole Mercado is a 71 y.o. female who is 1 Day Post-Op left Carotid Endarterectomy . Patient is doing well. Pre-operative symptoms are Improved Patient denies headache; Patient denies difficulty swallowing; denies weakness in upper or lower extremities; Pt. denies other symptoms of stroke or TIA.   Marginal mandibular nerve palsy.  IMAGING: No results found.  Significant Diagnostic Studies: CBC Lab Results  Component Value Date   WBC 14.2* 06/10/2011   HGB 8.8* 06/10/2011   HCT 25.7* 06/10/2011   MCV 100.4* 06/10/2011   PLT 65* 06/10/2011    BMET    Component Value Date/Time   NA 137 06/10/2011 0447   K 4.1 06/10/2011 0447   CL 105 06/10/2011 0447   CO2 24 06/10/2011 0447   GLUCOSE 99 06/10/2011 0447   BUN 8 06/10/2011 0447   CREATININE 0.72 06/10/2011 0447   CALCIUM 8.6 06/10/2011 0447   GFRNONAA 85* 06/10/2011 0447   GFRAA >90 06/10/2011 0447    COAG Lab Results  Component Value Date   INR 0.99 06/06/2011   INR 1.03 11/11/2010   No results found for this basename: PTT      Intake/Output Summary (Last 24 hours) at 06/10/11 0726 Last data filed at 06/09/11 1700  Gross per 24 hour  Intake   1775 ml  Output    100 ml  Net   1675 ml    Physical Exam:  BP Readings from Last 3 Encounters:  06/10/11 113/41  06/10/11 113/41  06/06/11 166/83   Temp Readings from Last 3 Encounters:  06/10/11 98.9 F (37.2 C) Oral  06/10/11 98.9 F (37.2 C) Oral  06/06/11 97 F (36.1 C)    SpO2 Readings from Last 3 Encounters:  06/10/11 93%  06/10/11 93%  06/06/11 97%   Pulse Readings from Last 3 Encounters:  06/10/11 74  06/10/11 74  06/06/11 60    Pt is A&O x 3 Gait is normal Speech is normal left Neck Wound is clean, dry, intact or healing well Patient with Negative tongue deviation and Positive, Negative facial  droop left Pt has good and equal strength in all extremities  Assessment: Nicole Mercado is a 71 y.o. female is S/P Left Carotid endarterectomy Pt is voiding, ambulating and taking po well   Plan: Discharge to: Home Follow-up in 2 weeks   [name] [phone] 06/10/2011 7:26 AM   I have examined the patient, reviewed and agree with above.  Erabella Kuipers, MD 06/10/2011 8:06 AM

## 2011-06-11 ENCOUNTER — Other Ambulatory Visit: Payer: Self-pay

## 2011-06-11 DIAGNOSIS — I6529 Occlusion and stenosis of unspecified carotid artery: Secondary | ICD-10-CM

## 2011-06-23 ENCOUNTER — Encounter: Payer: Self-pay | Admitting: Vascular Surgery

## 2011-06-24 ENCOUNTER — Encounter: Payer: Self-pay | Admitting: Vascular Surgery

## 2011-06-24 ENCOUNTER — Ambulatory Visit (INDEPENDENT_AMBULATORY_CARE_PROVIDER_SITE_OTHER): Payer: Medicare Other | Admitting: Vascular Surgery

## 2011-06-24 VITALS — BP 150/54 | HR 55 | Resp 18 | Ht 60.0 in | Wt 97.4 lb

## 2011-06-24 DIAGNOSIS — I6529 Occlusion and stenosis of unspecified carotid artery: Secondary | ICD-10-CM

## 2011-06-24 DIAGNOSIS — Z48812 Encounter for surgical aftercare following surgery on the circulatory system: Secondary | ICD-10-CM

## 2011-06-24 NOTE — Progress Notes (Signed)
The patient has today for followup of her left carotid endarterectomy for severe left carotid stenosis. He did have a high extension to the terminal carotid stenosis. She did well with the surgery and did have marginal mandibular nerve palsy from retraction. She was discharged home on postoperative day 1. She's had minimal discomfort and is returned are full activities with no limitation.  Is exam reveals well-healed incision with the usual healing ridge. She does have improvement in her marginal mandibular nerve palsy. She is grossly intact neurologically otherwise.  Impression and plan: Table status post left carotid endarterectomy. She will resemble activities without limitations. We will see her again in 6 months with a carotid duplex followup

## 2011-06-25 NOTE — Addendum Note (Signed)
Addended by: Sharee Pimple on: 06/25/2011 08:49 AM   Modules accepted: Orders

## 2011-06-26 ENCOUNTER — Encounter: Payer: Self-pay | Admitting: Cardiothoracic Surgery

## 2011-06-26 ENCOUNTER — Institutional Professional Consult (permissible substitution) (INDEPENDENT_AMBULATORY_CARE_PROVIDER_SITE_OTHER): Payer: Medicare Other | Admitting: Cardiothoracic Surgery

## 2011-06-26 ENCOUNTER — Encounter: Payer: Medicare Other | Admitting: Cardiothoracic Surgery

## 2011-06-26 VITALS — BP 127/46 | HR 58 | Resp 16 | Ht 60.0 in | Wt 100.0 lb

## 2011-06-26 DIAGNOSIS — I6529 Occlusion and stenosis of unspecified carotid artery: Secondary | ICD-10-CM

## 2011-06-26 DIAGNOSIS — I359 Nonrheumatic aortic valve disorder, unspecified: Secondary | ICD-10-CM

## 2011-06-26 DIAGNOSIS — I350 Nonrheumatic aortic (valve) stenosis: Secondary | ICD-10-CM

## 2011-06-26 NOTE — Progress Notes (Addendum)
[address]            [place] [address]          [phone]      Nicole Mercado [facility] Medical Record #[id number] Date of Birth: 09-04-1940  Referring: Governor Rooks, MD Primary Care: Paulino Rily, MD  Chief Complaint:    Chief Complaint  Patient presents with  . Aortic Stenosis    Referral from Dr Alanda Amass for eval on aortic stenosis, carotid stenosis    History of Present Illness:    Patient is a 71 year old female who recently underwent left carotid endarterectomy for asymptomatic high-grade left carotid stenosis. She is known to have critical aortic stenosis and is referred by Dr. Alanda Amass to discuss aortic valve replacement options. She has known chronic lymphocytic leukemia and chronic thrombocytopenia but under no active treatment for this. She denies any classic symptoms of aortic stenosis including syncope near syncope angina exertional shortness of breath or orthopnea.      Current Activity/ Functional Status: Patient is independent with mobility/ambulation, transfers, ADL's, IADL's.   Past Medical History  Diagnosis Date  . Thrombocytopenia 11/19/2010  . Hypertension   . 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 Arthritis   . Heart murmur   . Pancreatitis     h/o    Past Surgical History  Procedure Date  . Cesarean section     FIVE  . Cholecystectomy   . Appendectomy   . Abdominal hysterectomy   . Tonsillectomy   . Endarterectomy 06/09/2011    Procedure: ENDARTERECTOMY CAROTID;  Surgeon: Larina Earthly, MD;  Location: [facility] OR;  Service: Vascular;  Laterality: Left;  left carotid endarterectomy with patch angioplasty    Family History  Problem Relation Age of Onset  . Heart disease Mother   . Hypertension Mother   . Heart disease Father   . Hypertension Father     History   Social History  . Marital Status: Widowed    Spouse Name: [name]    Number of Children: [name]  . Years of Education: [name]    Occupational History  . Not on file.   Social History Main Topics  . Smoking status: Never Smoker   . Smokeless tobacco: Never Used  . Alcohol Use: No  . Drug Use: No  . Sexually Active: Not on file   Other Topics Concern  . Not on file   Social History Narrative  . No narrative on file    History  Smoking status  . Never Smoker   Smokeless tobacco  . Never Used    History  Alcohol Use No     No Known Allergies  Current Outpatient Prescriptions  Medication Sig Dispense Refill  . acetaminophen (TYLENOL) 500 MG tablet Take 1,000 mg by mouth every 6 (six) hours as needed. pain       . aspirin EC 81 MG tablet Take 81 mg by mouth daily.        . Calcium-Vitamin D 500-125 MG-UNIT TABS Take 2 tablets by mouth daily.       . Cyanocobalamin (VITAMIN B 12 PO) Take 1 tablet by mouth daily.       Marland Kitchen estradiol (CLIMARA - DOSED IN MG/24 HR) 0.05 mg/24hr Place 1 patch onto the skin once a week.        Marland Kitchen lisinopril (PRINIVIL,ZESTRIL) 20 MG  tablet Take 20 mg by mouth daily.        . metoprolol (LOPRESSOR) 50 MG tablet Take 50 mg by mouth 2 (two) times daily.        . Multiple Vitamins-Minerals (MULTIVITAMINS THER. W/MINERALS) TABS Take 1 tablet by mouth daily.             Review of Systems:     Cardiac Review of Systems: Y or N  Chest Pain [  n  ]  Resting SOB [  n ] Exertional SOB  [n  ]  Orthopnea [n  ]   Pedal Edema Milo.Brash   ]    Palpitations [ n ] Syncope  [ n ]   Presyncope [n   ]  General Review of Systems: [Y] = yes [  ]=no Constitional: recent weight change [  ]; anorexia [  ]; fatigue [ n ]; nausea [  ]; night sweats [  ]; fever [  ]; or chills [  ];                                                                                                                                          Dental: poor dentition[ n ]; Last Dentist visit: 4 months ago  Eye : blurred vision [  ]; diplopia [   ]; vision changes [  ];  Amaurosis fugax[  ]; Resp: cough [  ];  wheezing[  ];   hemoptysis[  ]; shortness of breath[ n ]; paroxysmal nocturnal dyspnea[n  ]; dyspnea on exertion[n  ]; or orthopnea[  ];  GI:  gallstones[  ], vomiting[  ];  dysphagia[  ]; melena[  ];  hematochezia [  ]; heartburn[  ];   Hx of  Colonoscopy[  ]; GU: kidney stones [  ]; hematuria[  ];   dysuria [  ];  nocturia[  ];  history of     obstruction [  ];             Skin: rash, swelling[  ];, hair loss[  ];  peripheral edema[  ];  or itching[  ]; Musculosketetal: myalgias[  ];  joint swelling[  ];  joint erythema[  ];  joint pain[  ];  back pain[  ];  Heme/Lymph: bruising[  ];  bleeding[  ];  anemia[  ];  Neuro: TIA[n  ];  headaches[ n ];  stroke[ n ];  vertigo[ n ];  seizures[ n ];   paresthesias[  ];  difficulty walking[  ];  Psych:depression[  ]; anxiety[  ];  Endocrine: diabetes[  ];  thyroid dysfunction[  ];  Immunizations: Flu [ y ]; Pneumococcal[y  ];  Other:  Physical Exam: BP 127/46  Pulse 58  Resp 16  Ht 5' (1.524 m)  Wt 100 lb (45.36 kg)  BMI 19.53 kg/m2  SpO2 100%  General appearance: alert, cooperative, appears  stated age and no distress Neurologic: intact Heart: systolic murmur: holosystolic 4/6, crescendo throughout the precordium Lungs: clear to auscultation bilaterally and normal percussion bilaterally Abdomen: soft, non-tender; bowel sounds normal; no masses,  no organomegaly Extremities: extremities normal, atraumatic, no cyanosis or edema and Homans sign is negative, no sign of DVT Wound: Left neck wound from recent carotid endarterectomy is well-healed   Diagnostic Studies & Laboratory data:     Recent Radiology Findings:  No results found.  ECHO: Echo done as an outpatient not in a consistent on paper report dated 04/18/2011 aortic valve is trileaflet severe calcification moderate to severe valvular aortic stenosis estimated valve area 0.65 cm, 0.5 cm by continuity equation maximum velocity 436 cm/s across the aortic valve estimated ejection fraction 55-60%,    Cardiac Cath : none done  Recent Lab Findings: Lab Results  Component Value Date   WBC 14.2* 06/10/2011   HGB 8.8* 06/10/2011   HCT 25.7* 06/10/2011   PLT 65* 06/10/2011   GLUCOSE 99 06/10/2011   ALT 14 06/06/2011   AST 24 06/06/2011   NA 137 06/10/2011   K 4.1 06/10/2011   CL 105 06/10/2011   CREATININE 0.72 06/10/2011   BUN 8 06/10/2011   CO2 24 06/10/2011   INR 0.99 06/06/2011      Assessment / Plan:   Patient has this history of CLL, chronic thrombocytopenia and currently is anemic and thrombocytopenic following her recent surgery   Patient is referred to discuss aortic valve replacement, her coronary status is unknown. She denies any symptoms classic for aortic stenosis, but did not echocardiography has evidence of critical aortic stenosis with preserved LV function. I discussed with her the treatment options and it before any surgery is performed she would require cardiac catheterization to evaluate her coronary status. She would like to defer surgery until she is able to pay for the left carotid endarterectomies that she just had last week. Her questions were answered, and encouraged her just today in close contact with cardiology with serial evaluation of her symptomatology. She is aware of the critical aortic stenosis can result in sudden death. When she decides to proceed with aortic valve replacement she will need cardiac catheterization now be glad to see her again.       Delight Ovens MD  Beeper [phone] Office [phone] 06/26/2011 6:15 PM

## 2011-06-27 ENCOUNTER — Telehealth: Payer: Self-pay | Admitting: Oncology

## 2011-06-27 NOTE — Telephone Encounter (Signed)
L/M THAT I MOVED APPT FROM 7/19 TO 8/1    AOM

## 2011-07-25 ENCOUNTER — Other Ambulatory Visit: Payer: Medicare Other | Admitting: Lab

## 2011-07-25 ENCOUNTER — Ambulatory Visit: Payer: Medicare Other | Admitting: Oncology

## 2011-08-04 ENCOUNTER — Telehealth: Payer: Self-pay | Admitting: Oncology

## 2011-08-04 NOTE — Telephone Encounter (Signed)
pt called to change appt to 9/17 for an earlier time  aom

## 2011-08-04 NOTE — Telephone Encounter (Signed)
pt called in and l/m to r/s 8/1 appt,l/m with next avail which is 9/9 and advised to call if not a good time   aom

## 2011-08-07 ENCOUNTER — Other Ambulatory Visit: Payer: Medicare Other | Admitting: Lab

## 2011-08-07 ENCOUNTER — Ambulatory Visit: Payer: Medicare Other | Admitting: Oncology

## 2011-08-13 NOTE — Telephone Encounter (Signed)
No note

## 2011-09-15 ENCOUNTER — Other Ambulatory Visit: Payer: Medicare Other | Admitting: Lab

## 2011-09-15 ENCOUNTER — Ambulatory Visit: Payer: Medicare Other | Admitting: Oncology

## 2011-09-23 ENCOUNTER — Other Ambulatory Visit (HOSPITAL_BASED_OUTPATIENT_CLINIC_OR_DEPARTMENT_OTHER): Payer: Medicare Other | Admitting: Lab

## 2011-09-23 ENCOUNTER — Telehealth: Payer: Self-pay | Admitting: Oncology

## 2011-09-23 ENCOUNTER — Ambulatory Visit (HOSPITAL_BASED_OUTPATIENT_CLINIC_OR_DEPARTMENT_OTHER): Payer: Medicare Other | Admitting: Oncology

## 2011-09-23 ENCOUNTER — Encounter: Payer: Self-pay | Admitting: Oncology

## 2011-09-23 VITALS — BP 138/62 | HR 53 | Temp 96.7°F | Resp 20 | Ht 60.0 in | Wt 98.6 lb

## 2011-09-23 DIAGNOSIS — D696 Thrombocytopenia, unspecified: Secondary | ICD-10-CM

## 2011-09-23 DIAGNOSIS — D801 Nonfamilial hypogammaglobulinemia: Secondary | ICD-10-CM

## 2011-09-23 DIAGNOSIS — C9110 Chronic lymphocytic leukemia of B-cell type not having achieved remission: Secondary | ICD-10-CM

## 2011-09-23 LAB — COMPREHENSIVE METABOLIC PANEL (CC13)
ALT: 19 U/L (ref 0–55)
AST: 25 U/L (ref 5–34)
Albumin: 3.8 g/dL (ref 3.5–5.0)
Alkaline Phosphatase: 76 U/L (ref 40–150)
BUN: 20 mg/dL (ref 7.0–26.0)
Chloride: 104 mEq/L (ref 98–107)
Potassium: 5 mEq/L (ref 3.5–5.1)

## 2011-09-23 LAB — CBC WITH DIFFERENTIAL/PLATELET
BASO%: 0.2 % (ref 0.0–2.0)
Basophils Absolute: 0 10*3/uL (ref 0.0–0.1)
EOS%: 0.3 % (ref 0.0–7.0)
HGB: 11.9 g/dL (ref 11.6–15.9)
MCH: 34.5 pg — ABNORMAL HIGH (ref 25.1–34.0)
MCHC: 33.3 g/dL (ref 31.5–36.0)
MCV: 103.4 fL — ABNORMAL HIGH (ref 79.5–101.0)
MONO%: 9.3 % (ref 0.0–14.0)
RBC: 3.45 10*6/uL — ABNORMAL LOW (ref 3.70–5.45)
RDW: 14.3 % (ref 11.2–14.5)
lymph#: 18.2 10*3/uL — ABNORMAL HIGH (ref 0.9–3.3)

## 2011-09-23 NOTE — Progress Notes (Signed)
This office note has been dictated.  #[id number]

## 2011-09-23 NOTE — Progress Notes (Signed)
CC:   Darrin Nipper, MD Richard A. Alanda Amass, M.D. Larina Earthly, M.D.  PROBLEM LIST:  1. Chronic lymphocytic leukemia with onset probably in 2008 or 2009,  now with a confirmatory core needle biopsy of a left  supraclavicular lymph node on 11/11/2010 and confirmatory flow  cytometry on the peripheral blood carried out on 11/19/2010. The  patient has stage I disease at this time, with associated  thrombocytopenia and hypogammaglobulinemia, the latter first  detected in September 2008.  2. Thrombocytopenia.  3. Hypogammaglobulinemia.  4. Left carotid artery calcification and stenosis noted on CT  scan of the neck on 11/05/2010. Left carotid endarterectomy with Dacron patch angioplasty by Dr. Gretta Began on June 09, 2011. 5. Aortic stenosis.  6. Hypertension. 7. History of acute pancreatitis February 2002. 8. Status post cholecystectomy for gallstones and acute pancreatitis,     February 2002.  MEDICATION:  1. Aspirin 81 mg daily.  2. Calcium and vitamin D 500/125 mg 2 tablets daily.  3. Vitamin B12 1 tablet daily.  4. Estradiol (Climara) patch, change weekly.  5. Lisinopril 20 mg daily.  6. Toprol-XL 75 mg daily. 7. Multivitamins and minerals 1 tablet daily.   IMMUNIZATIONS: Pneumovax given in December 2012, flu shot given  December 2012 and September 2013.  SMOKING HISTORY:  The patient has never smoked cigarettes.    HISTORY:  Nicole Mercado was seen today for followup of her chronic lymphocytic leukemia associated with thrombocytopenia and hypogammaglobulinemia.  It is felt that the onset of the patient's disease may go back to 2008 or 2009.  The patient is accompanied by her significant other, [employer].  She was last seen by [place] on 03/21/2011.  Nicole Mercado underwent a left carotid endarterectomy on 06/09/2011 by Dr. Gretta Began.  She did very well with that surgery.  She is completely asymptomatic at the present time and feels entirely well with no fever, chills, night  sweats, any sense of ill health.  Energy and appetite are excellent.  She has not had any infections, bleeding, or bruising problems.  PHYSICAL EXAMINATION:  General:  She looks well with no obvious changes in her appearance.  Vital Signs:  Weight is 98.6 pounds, height 5 feet even, body surface area 1.38 sq m.  Blood pressure 138/62.  Pulse 53 and regular, respirations regular and unlabored.  She is afebrile.  HEENT: There is no scleral icterus.  Mouth and pharynx are benign.  Lymph: There is no obvious peripheral adenopathy palpable, although there is a very subtle 1-cm left supraclavicular lymph node located medially near the head of the clavicle.  No obvious adenopathy in the neck, supraclavicular, axillary, or inguinal areas.  Lungs:  Clear to percussion and auscultation.  Cardiac Exam:  Regular rhythm with prominent systolic ejection murmur which radiates into both sides of the neck.  Murmur apparently is due to aortic stenosis.  I should mention the patient has a well-healed scar in the left neck where she had a carotid endarterectomy.  Back:  No skeletal tenderness.  She has large seborrheic keratoses over the back.  Breasts:  Not examined.  Abdomen: Benign with no organomegaly or masses palpable.  Extremities:  No peripheral edema or clubbing.  Neurologic Exam:  Normal.  LABORATORY DATA:  Today white count 21.9, ANC 1.6, hemoglobin 11.9, hematocrit 35.6, platelets 74,000.  Platelet count on 03/21/2011 was 107,000 and 110,000 on 11/19/2010.  There was no evidence for platelet clumping in the past.  Absolute lymphocyte count is 18.2, which is fairly  stable.  Chemistries from today notable for a calcium of 10.6, albumin 3.8, LDH 248.  LDH is slightly elevated with normal up to 220. I should mention that the patient does take calcium and vitamin D, 1 tablet by mouth.   IMAGING STUDIES:  1. CT scan of the neck with IV contrast on 11/05/2010 showed  generalized bilateral cervical  and supraclavicular lymphadenopathy,  maximal on the left at levels III and IV. The left supraclavicular  lymph node at level IV measured 16 x 18 x 32 mm overall. Small  nodes seen in the superior mediastinum were felt to be normal.  There was felt to be increased number of the axillary lymph nodes  that were partially visible on series 3, image 20. Thyroid was  negative. There was coarse circumferential left carotid calcified  atherosclerosis with suspected hemodynamically significant  stenosis.  2. Ultrasound-guided cervical lymph node biopsy was carried out on  11/11/2010.  3. Chest x-ray, 2 view, from 03/21/2011 was normal. 4. Myocardial perfusion study carried out on 04/18/2011 showed normal perfusion pattern and no EKG changes.   IMPRESSION AND PLAN:  The patient's clinical status remains excellent. Today her platelet count has decreased a little bit from prior studies carried out in March and 2022/12/24.  The patient's lymphadenopathy if anything has decreased.  Her LDH is slightly elevated today.  We have adopted a conservative approach to this patient.  We have not done any abdominal imaging.  I have asked the patient to return for lab work in 2 months, at which time we will check CBC, chemistries, and LDH.  I would like to see the patient again in 4 months, at which time we will check the same labs.  Today's labs will be forwarded to Dr. Alanda Amass at his request.  The patient has not had mammograms; in fact, she has never had mammography.  I offered to make an appointment for her.  The patient stated that she would make this appointment on her own.  We will continue to follow the patient closely.  At some point, she will need abdominal CT scans.  If her platelet count continues to drop, she will probably require a bone marrow exam.    ______________________________ Samul Dada, M.D. DSM/MEDQ  D:  09/23/2011  T:  09/23/2011  Job:  [id number]

## 2011-09-23 NOTE — Telephone Encounter (Signed)
Gave pt appt for lab on November 2013 and January [address] lab and MD

## 2011-11-24 ENCOUNTER — Other Ambulatory Visit (HOSPITAL_BASED_OUTPATIENT_CLINIC_OR_DEPARTMENT_OTHER): Payer: Medicare Other | Admitting: Lab

## 2011-11-24 DIAGNOSIS — C9110 Chronic lymphocytic leukemia of B-cell type not having achieved remission: Secondary | ICD-10-CM

## 2011-11-24 LAB — CBC WITH DIFFERENTIAL/PLATELET
BASO%: 0.2 % (ref 0.0–2.0)
EOS%: 0.4 % (ref 0.0–7.0)
Eosinophils Absolute: 0.1 10*3/uL (ref 0.0–0.5)
HCT: 36.1 % (ref 34.8–46.6)
LYMPH%: 91.3 % — ABNORMAL HIGH (ref 14.0–49.7)
MCV: 103.1 fL — ABNORMAL HIGH (ref 79.5–101.0)
MONO#: 0.3 10*3/uL (ref 0.1–0.9)
NEUT#: 1.5 10*3/uL (ref 1.5–6.5)
NEUT%: 6.7 % — ABNORMAL LOW (ref 38.4–76.8)
Platelets: 84 10*3/uL — ABNORMAL LOW (ref 145–400)
WBC: 22.5 10*3/uL — ABNORMAL HIGH (ref 3.9–10.3)

## 2011-11-24 LAB — COMPREHENSIVE METABOLIC PANEL (CC13)
ALT: 18 U/L (ref 0–55)
AST: 22 U/L (ref 5–34)
Albumin: 3.7 g/dL (ref 3.5–5.0)
Alkaline Phosphatase: 59 U/L (ref 40–150)
Calcium: 10 mg/dL (ref 8.4–10.4)
Chloride: 105 mEq/L (ref 98–107)
Potassium: 4.4 mEq/L (ref 3.5–5.1)
Sodium: 139 mEq/L (ref 136–145)
Total Protein: 6.3 g/dL — ABNORMAL LOW (ref 6.4–8.3)

## 2011-11-26 ENCOUNTER — Encounter: Payer: Self-pay | Admitting: Oncology

## 2012-01-12 ENCOUNTER — Encounter: Payer: Self-pay | Admitting: Vascular Surgery

## 2012-01-13 ENCOUNTER — Other Ambulatory Visit (INDEPENDENT_AMBULATORY_CARE_PROVIDER_SITE_OTHER): Payer: Medicare Other | Admitting: *Deleted

## 2012-01-13 ENCOUNTER — Encounter: Payer: Self-pay | Admitting: Neurosurgery

## 2012-01-13 ENCOUNTER — Ambulatory Visit (INDEPENDENT_AMBULATORY_CARE_PROVIDER_SITE_OTHER): Payer: Medicare Other | Admitting: Neurosurgery

## 2012-01-13 VITALS — BP 138/67 | HR 47 | Resp 16 | Ht 60.0 in | Wt 98.0 lb

## 2012-01-13 DIAGNOSIS — I6529 Occlusion and stenosis of unspecified carotid artery: Secondary | ICD-10-CM

## 2012-01-13 DIAGNOSIS — Z48812 Encounter for surgical aftercare following surgery on the circulatory system: Secondary | ICD-10-CM

## 2012-01-13 NOTE — Progress Notes (Signed)
[facility] Carotid Office Note  CC: Carotid surveillance Referring Physician: Early  History of Present Illness: 72 year old female patient of Dr. Arbie Cookey status post left CEA in June 2013. The patient denies any signs or symptoms of CVA, TIA, amaurosis fugax or any neural deficit. The patient denies any new medical diagnoses or recent surgery.  Past Medical History  Diagnosis Date  . Thrombocytopenia 11/19/2010  . Hypertension   . 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 Arthritis   . Heart murmur   . Pancreatitis     h/o  . Carotid artery occlusion     ROS: [x]  Positive   [ ]  Denies    General: [ ]  Weight loss, [ ]  Fever, [ ]  chills Neurologic: [ ]  Dizziness, [ ]  Blackouts, [ ]  Seizure [ ]  Stroke, [ ]  "Mini stroke", [ ]  Slurred speech, [ ]  Temporary blindness; [ ]  weakness in arms or legs, [ ]  Hoarseness Cardiac: [ ]  Chest pain/pressure, [ ]  Shortness of breath at rest [ ]  Shortness of breath with exertion, [ ]  Atrial fibrillation or irregular heartbeat Vascular: [ ]  Pain in legs with walking, [ ]  Pain in legs at rest, [ ]  Pain in legs at night,  [ ]  Non-healing ulcer, [ ]  Blood clot in vein/DVT,   Pulmonary: [ ]  Home oxygen, [ ]  Productive cough, [ ]  Coughing up blood, [ ]  Asthma,  [ ]  Wheezing Musculoskeletal:  [ ]  Arthritis, [ ]  Low back pain, [ ]  Joint pain Hematologic: [ ]  Easy Bruising, [ ]  Anemia; [ ]  Hepatitis Gastrointestinal: [ ]  Blood in stool, [ ]  Gastroesophageal Reflux/heartburn, [ ]  Trouble swallowing Urinary: [ ]  chronic Kidney disease, [ ]  on HD - [ ]  MWF or [ ]  TTHS, [ ]  Burning with urination, [ ]  Difficulty urinating Skin: [ ]  Rashes, [ ]  Wounds Psychological: [ ]  Anxiety, [ ]  Depression   Social History History  Substance Use Topics  . Smoking status: Never Smoker   . Smokeless tobacco: Never Used  . Alcohol Use: No    Family History Family History  Problem Relation Age of Onset  . Heart disease Mother   .  Hypertension Mother   . Heart disease Father   . Hypertension Father     No Known Allergies  Current Outpatient Prescriptions  Medication Sig Dispense Refill  . acetaminophen (TYLENOL) 500 MG tablet Take 1,000 mg by mouth every 6 (six) hours as needed. pain       . aspirin EC 81 MG tablet Take 81 mg by mouth daily.        . Calcium-Vitamin D 500-125 MG-UNIT TABS Take 1 tablet by mouth daily.       . Cyanocobalamin (VITAMIN B 12 PO) Take 1 tablet by mouth daily.       Marland Kitchen estradiol (CLIMARA - DOSED IN MG/24 HR) 0.05 mg/24hr Place 1 patch onto the skin once a week.        Marland Kitchen lisinopril (PRINIVIL,ZESTRIL) 20 MG tablet Take 20 mg by mouth daily.        . metoprolol succinate (TOPROL-XL) 50 MG 24 hr tablet Take 50 mg by mouth daily. Take 1 1/2 tab.Take with or immediately following a meal.      . Multiple Vitamins-Minerals (MULTIVITAMINS THER. W/MINERALS) TABS Take 1 tablet by mouth daily.          Physical Examination  Filed Vitals:   01/13/12 1043  BP: 138/67  Pulse:  47  Resp:     Body mass index is 19.14 kg/(m^2).  General:  WDWN in NAD Gait: Normal HEENT: WNL Eyes: Pupils equal Pulmonary: normal non-labored breathing , without Rales, rhonchi,  wheezing Cardiac: RRR, without  Murmurs, rubs or gallops; Abdomen: soft, NT, no masses Skin: no rashes, ulcers noted  Vascular Exam Pulses: 3+ radial pulses bilaterally Carotid bruits: Carotid pulses to auscultation no bruits are heard Extremities without ischemic changes, no Gangrene , no cellulitis; no open wounds;  Musculoskeletal: no muscle wasting or atrophy   Neurologic: A&O X 3; Appropriate Affect ; SENSATION: normal; MOTOR FUNCTION:  moving all extremities equally. Speech is fluent/normal  Non-Invasive Vascular Imaging CAROTID DUPLEX 01/13/2012  Right ICA 20 - 39 % stenosis Left ICA 40 - 59 % stenosis Widely patent left CEA with evidence of a mild hyperplasia of the bifurcation proximal segment of the ICA small amount of  plaque noted in the proximal mid segment of the ICA distal to the distal patch site velocities are suggestive of 40-59% stenosis  ASSESSMENT/PLAN: Asymptomatic patient doing well 7 months post left CEA, the patient will followup in 6 months with repeat carotid duplex. The patient's questions were encouraged and answered, she is in agreement with this plan.  Lauree Chandler ANP   Clinic MD: Early

## 2012-01-13 NOTE — Addendum Note (Signed)
Addended by: Dannielle Karvonen on: 01/13/2012 04:14 PM   Modules accepted: Orders

## 2012-01-15 ENCOUNTER — Telehealth: Payer: Self-pay | Admitting: Oncology

## 2012-01-15 NOTE — Telephone Encounter (Signed)
Moved 1/17 appt to 1/16 w/JH - poof. S/w pt she is aware of new d/t.

## 2012-01-22 ENCOUNTER — Other Ambulatory Visit (HOSPITAL_BASED_OUTPATIENT_CLINIC_OR_DEPARTMENT_OTHER): Payer: Medicare Other | Admitting: Lab

## 2012-01-22 ENCOUNTER — Encounter: Payer: Self-pay | Admitting: Family

## 2012-01-22 ENCOUNTER — Telehealth: Payer: Self-pay | Admitting: Oncology

## 2012-01-22 ENCOUNTER — Ambulatory Visit (HOSPITAL_BASED_OUTPATIENT_CLINIC_OR_DEPARTMENT_OTHER): Payer: Medicare Other | Admitting: Family

## 2012-01-22 VITALS — BP 142/60 | HR 48 | Temp 96.8°F | Resp 20 | Ht 60.0 in | Wt 99.4 lb

## 2012-01-22 DIAGNOSIS — D696 Thrombocytopenia, unspecified: Secondary | ICD-10-CM

## 2012-01-22 DIAGNOSIS — C9110 Chronic lymphocytic leukemia of B-cell type not having achieved remission: Secondary | ICD-10-CM

## 2012-01-22 DIAGNOSIS — I10 Essential (primary) hypertension: Secondary | ICD-10-CM

## 2012-01-22 DIAGNOSIS — D801 Nonfamilial hypogammaglobulinemia: Secondary | ICD-10-CM

## 2012-01-22 LAB — COMPREHENSIVE METABOLIC PANEL (CC13)
ALT: 14 U/L (ref 0–55)
Alkaline Phosphatase: 72 U/L (ref 40–150)
Creatinine: 1 mg/dL (ref 0.6–1.1)
Glucose: 89 mg/dl (ref 70–99)
Sodium: 140 mEq/L (ref 136–145)
Total Bilirubin: 0.52 mg/dL (ref 0.20–1.20)
Total Protein: 6.9 g/dL (ref 6.4–8.3)

## 2012-01-22 LAB — TECHNOLOGIST REVIEW

## 2012-01-22 LAB — LACTATE DEHYDROGENASE (CC13): LDH: 219 U/L (ref 125–245)

## 2012-01-22 LAB — CBC WITH DIFFERENTIAL/PLATELET
BASO%: 0.1 % (ref 0.0–2.0)
HCT: 33.8 % — ABNORMAL LOW (ref 34.8–46.6)
LYMPH%: 86.8 % — ABNORMAL HIGH (ref 14.0–49.7)
MCHC: 34.1 g/dL (ref 31.5–36.0)
MCV: 102.5 fL — ABNORMAL HIGH (ref 79.5–101.0)
MONO%: 5.6 % (ref 0.0–14.0)
NEUT%: 7.4 % — ABNORMAL LOW (ref 38.4–76.8)
Platelets: 77 10*3/uL — ABNORMAL LOW (ref 145–400)
RBC: 3.3 10*6/uL — ABNORMAL LOW (ref 3.70–5.45)

## 2012-01-22 NOTE — Progress Notes (Signed)
Patient ID: Nicole Mercado, female   DOB: [birth year], 71 y.o.   MRN: [id number] CSN: [id number]  CC: Darrin Nipper, MD  Richard A. Alanda Amass, MD Larina Earthly, MD  Problem List: Nicole Mercado is a 72 y.o. Caucasian female with a problem list consisting of:  CC: Darrin Nipper, MD  Richard A. Alanda Amass, M.D.  Larina Earthly, M.D.  1. Chronic lymphocytic leukemia with onset probably in 2008 or 2009, now with a confirmatory core needle biopsy of a left supraclavicular lymph node on 11/11/2010 and confirmatory flow cytometry on the peripheral blood carried out on 11/19/2010. The  patient has stage I disease at this time, with associated thrombocytopenia and hypogammaglobulinemia, the latter first detected in September 2008.  2. Thrombocytopenia  3. Hypogammaglobulinemia 4. Left carotid artery calcification and stenosis noted on CT scan of the neck on 11/05/2010. Left carotid endarterectomy with Dacron patch angioplasty by Dr. Gretta Began on June 09, 2011.  5. Aortic stenosis 6. Hypertension  7. History of acute pancreatitis February 2002.  8. Status post cholecystectomy for gallstones and acute pancreatitis, February 2002.    Dr. Arline Asp and I saw [name] today for followup of her chronic lymphocytic leukemia associated with thrombocytopenia and  hypogammaglobulinemia. It is felt that the onset of the patient's disease may go back to 2008 or 2009. The patient is accompanied by her significant other, Nicole Mercado for today's office visit. She was last seen by [place] on 09/23/2011.  She is completely asymptomatic at the present time and feels entirely well with no fever, chills, night sweats, any sense of ill health. Energy and appetite are excellent. She has not had any infections, bleeding, or bruising problems. She has never had a mammogram and states that she does not have any intentions on ever obtaining one.   Past Medical History: Past Medical History  Diagnosis Date  .  Thrombocytopenia 11/19/2010  . Hypertension   . 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 Arthritis   . Heart murmur   . Pancreatitis     h/o  . Carotid artery occlusion     Surgical History: Past Surgical History  Procedure Date  . Cesarean section     FIVE  . Cholecystectomy   . Appendectomy   . Abdominal hysterectomy   . Tonsillectomy   . Endarterectomy 06/09/2011    Procedure: ENDARTERECTOMY CAROTID;  Surgeon: Larina Earthly, MD;  Location: [facility] OR;  Service: Vascular;  Laterality: Left;  left carotid endarterectomy with patch angioplasty  . Carotid endarterectomy 06/09/11    LEFT  cea    Current Medications: Current Outpatient Prescriptions  Medication Sig Dispense Refill  . acetaminophen (TYLENOL) 500 MG tablet Take 1,000 mg by mouth every 6 (six) hours as needed. pain       . aspirin EC 81 MG tablet Take 81 mg by mouth daily.        . Calcium-Vitamin D 500-125 MG-UNIT TABS Take 1 tablet by mouth daily.       . Cyanocobalamin (VITAMIN B 12 PO) Take 1 tablet by mouth daily.       Marland Kitchen estradiol (CLIMARA - DOSED IN MG/24 HR) 0.05 mg/24hr Place 1 patch onto the skin once a week.        Marland Kitchen lisinopril (PRINIVIL,ZESTRIL) 20 MG tablet Take 20 mg by mouth daily.        . metoprolol succinate (TOPROL-XL) 50 MG 24 hr tablet Take 50 mg by mouth daily. Take  1 1/2 tab.Take with or immediately following a meal.      . Multiple Vitamins-Minerals (MULTIVITAMINS THER. W/MINERALS) TABS Take 1 tablet by mouth daily.          Allergies: No Known Allergies  Family History: Family History  Problem Relation Age of Onset  . Heart disease Mother   . Hypertension Mother   . Heart disease Father   . Hypertension Father     Social History: History  Substance Use Topics  . Smoking status: Never Smoker   . Smokeless tobacco: Never Used  . Alcohol Use: No    Review of Systems: 10 Point review of systems was completed and is negative except as noted above.   Physical Exam:   Blood  pressure 142/60, pulse 48, temperature 96.8 F (36 C), temperature source Oral, resp. rate 20, height 5' (1.524 m), weight 99 lb 6.4 oz (45.088 kg).   General appearance: Alert, cooperative, thin but well nourished, no apparent distress Head: Normocephalic, without obvious abnormality, atraumatic Eyes: Conjunctivae/corneas clear, PERRLA, EOMI Nose: Nares, septum and mucosa are normal, no drainage or sinus tenderness. Neck: No adenopathy, supple, symmetrical, trachea midline, thyroid not enlarged, no tenderness Resp: Clear to auscultation bilaterally Cardio: Regular rate and rhythm, S1, S2 normal, 3/6 murmur, no click, rub or gallop GI: Soft, distended, non-tender, hypoactive bowel sounds, no organomegaly Extremities: Extremities normal, atraumatic, no cyanosis or edema Lymph nodes: Cervical, and axillary nodes are normal, subtle left supraclavicular node Neurologic: Grossly normal    Laboratory Data: Results for orders placed in visit on 01/22/12 (from the past 48 hour(s))  CBC WITH DIFFERENTIAL     Status: Abnormal   Collection Time   01/22/12  1:08 PM      Component Value Range Comment   WBC 26.8 (*) 3.9 - 10.3 10e3/uL    NEUT# 2.0  1.5 - 6.5 10e3/uL    HGB 11.5 (*) 11.6 - 15.9 g/dL    HCT 82.9 (*) 56.2 - 46.6 %    Platelets 77 (*) 145 - 400 10e3/uL    MCV 102.5 (*) 79.5 - 101.0 fL    MCH 34.9 (*) 25.1 - 34.0 pg    MCHC 34.1  31.5 - 36.0 g/dL    RBC 1.30 (*) 8.65 - 5.45 10e6/uL    RDW 14.9 (*) 11.2 - 14.5 %    lymph# 23.3 (*) 0.9 - 3.3 10e3/uL    MONO# 1.5 (*) 0.1 - 0.9 10e3/uL    Eosinophils Absolute 0.0  0.0 - 0.5 10e3/uL    Basophils Absolute 0.0  0.0 - 0.1 10e3/uL    NEUT% 7.4 (*) 38.4 - 76.8 %    LYMPH% 86.8 (*) 14.0 - 49.7 %    MONO% 5.6  0.0 - 14.0 %    EOS% 0.1  0.0 - 7.0 %    BASO% 0.1  0.0 - 2.0 %   COMPREHENSIVE METABOLIC PANEL (CC13)     Status: Abnormal   Collection Time   01/22/12  1:08 PM      Component Value Range Comment   Sodium 140  136 - 145 mEq/L     Potassium 4.7  3.5 - 5.1 mEq/L    Chloride 105  98 - 107 mEq/L    CO2 26  22 - 29 mEq/L    Glucose 89  70 - 99 mg/dl    BUN 78.4  7.0 - 69.6 mg/dL    Creatinine 1.0  0.6 - 1.1 mg/dL    Total Bilirubin 2.95  0.20 - 1.20 mg/dL    Alkaline Phosphatase 72  40 - 150 U/L    AST 21  5 - 34 U/L    ALT 14  0 - 55 U/L    Total Protein 6.9  6.4 - 8.3 g/dL    Albumin 3.7  3.5 - 5.0 g/dL    Calcium 81.1 (*) 8.4 - 10.4 mg/dL   LACTATE DEHYDROGENASE (CC13)     Status: Normal   Collection Time   01/22/12  1:08 PM      Component Value Range Comment   LDH 219  125 - 245 U/L   TECHNOLOGIST REVIEW     Status: Normal   Collection Time   01/22/12  1:08 PM      Component Value Range Comment   Technologist Review Variant lymphs present, few smudge cells        Imaging Studies: 1. CT scan of the neck with IV contrast on 11/05/2010 showed generalized bilateral cervical and supraclavicular lymphadenopathy,  maximal on the left at levels III and IV. The left supraclavicular lymph node at level IV measured 16 x 18 x 32 mm overall. Small nodes seen in the superior mediastinum were felt to be normal. There was felt to be increased number of the axillary lymph nodes that were partially visible on series 3, image 20. Thyroid was negative. There was coarse circumferential left carotid calcified atherosclerosis with suspected hemodynamically significant stenosis.  2. Ultrasound-guided cervical lymph node biopsy was carried out on 11/11/2010.  3. Chest x-ray, 2 view, from 03/21/2011 was normal.  4. Myocardial perfusion study carried out on 04/18/2011 showed normal perfusion pattern and no EKG changes.    Impression/Plan: Nicole Mercado's clinical status remains excellent. Today her platelet count has decreased a little bit from prior studies.  The patient's lymphadenopathy has decreased.  Dr. Arline Asp has adopted a conservative approach to this patient. We have not done any abdominal imaging. We plan to see Nicole Mercado again  in 4 months at which time we will check CMP, CMP and LDH. We will continue to follow the patient closely.  At some point, she will need abdominal CT scans. If her platelet count continues to drop, she  will probably require a bone marrow exam.  Nicole Mercado was encouraged to contact us in the interim if she has any questions or concerns.   Larina Bras, NP-C 01/22/2012, 8:18 PM

## 2012-01-22 NOTE — Patient Instructions (Addendum)
Please contact us at [phone] if you have any questions or concerns. 

## 2012-01-22 NOTE — Telephone Encounter (Signed)
appts made and printed for pt aom °

## 2012-01-23 ENCOUNTER — Ambulatory Visit: Payer: Medicare Other | Admitting: Oncology

## 2012-01-23 ENCOUNTER — Other Ambulatory Visit: Payer: Medicare Other | Admitting: Lab

## 2012-03-16 ENCOUNTER — Other Ambulatory Visit (HOSPITAL_COMMUNITY): Payer: Self-pay | Admitting: Cardiovascular Disease

## 2012-03-29 ENCOUNTER — Other Ambulatory Visit (HOSPITAL_COMMUNITY): Payer: Self-pay | Admitting: Cardiovascular Disease

## 2012-03-29 DIAGNOSIS — I359 Nonrheumatic aortic valve disorder, unspecified: Secondary | ICD-10-CM

## 2012-04-13 ENCOUNTER — Ambulatory Visit (HOSPITAL_COMMUNITY): Payer: Medicare Other

## 2012-05-03 ENCOUNTER — Ambulatory Visit (HOSPITAL_COMMUNITY): Payer: Medicare Other

## 2012-05-11 ENCOUNTER — Ambulatory Visit (HOSPITAL_COMMUNITY)
Admission: RE | Admit: 2012-05-11 | Discharge: 2012-05-11 | Disposition: A | Discharge status: Home / Self Care | Payer: Medicare Other | Source: Ambulatory Visit | Attending: Cardiovascular Disease | Admitting: Cardiovascular Disease

## 2012-05-11 DIAGNOSIS — C9110 Chronic lymphocytic leukemia of B-cell type not having achieved remission: Secondary | ICD-10-CM | POA: Insufficient documentation

## 2012-05-11 DIAGNOSIS — I10 Essential (primary) hypertension: Secondary | ICD-10-CM | POA: Insufficient documentation

## 2012-05-11 DIAGNOSIS — I739 Peripheral vascular disease, unspecified: Secondary | ICD-10-CM | POA: Insufficient documentation

## 2012-05-11 DIAGNOSIS — I359 Nonrheumatic aortic valve disorder, unspecified: Secondary | ICD-10-CM | POA: Insufficient documentation

## 2012-05-11 NOTE — Progress Notes (Signed)
[facility]   2D echo completed 05/11/2012.   Cindy Janoah Menna, RDCS  

## 2012-05-20 ENCOUNTER — Ambulatory Visit (HOSPITAL_BASED_OUTPATIENT_CLINIC_OR_DEPARTMENT_OTHER): Payer: Medicare Other | Admitting: Oncology

## 2012-05-20 ENCOUNTER — Telehealth: Payer: Self-pay | Admitting: Oncology

## 2012-05-20 ENCOUNTER — Other Ambulatory Visit (HOSPITAL_BASED_OUTPATIENT_CLINIC_OR_DEPARTMENT_OTHER): Payer: Medicare Other

## 2012-05-20 ENCOUNTER — Encounter: Payer: Self-pay | Admitting: Oncology

## 2012-05-20 VITALS — BP 178/44 | HR 65 | Temp 98.2°F | Resp 18 | Ht 60.0 in | Wt 99.9 lb

## 2012-05-20 DIAGNOSIS — D696 Thrombocytopenia, unspecified: Secondary | ICD-10-CM

## 2012-05-20 DIAGNOSIS — C9110 Chronic lymphocytic leukemia of B-cell type not having achieved remission: Secondary | ICD-10-CM

## 2012-05-20 LAB — CBC WITH DIFFERENTIAL/PLATELET
Basophils Absolute: 0 10*3/uL (ref 0.0–0.1)
EOS%: 0.2 % (ref 0.0–7.0)
Eosinophils Absolute: 0.1 10*3/uL (ref 0.0–0.5)
HCT: 35.3 % (ref 34.8–46.6)
HGB: 11.6 g/dL (ref 11.6–15.9)
MCH: 33.5 pg (ref 25.1–34.0)
NEUT#: 1.7 10*3/uL (ref 1.5–6.5)
NEUT%: 7.1 % — ABNORMAL LOW (ref 38.4–76.8)
RDW: 14.6 % — ABNORMAL HIGH (ref 11.2–14.5)
lymph#: 21.1 10*3/uL — ABNORMAL HIGH (ref 0.9–3.3)

## 2012-05-20 LAB — COMPREHENSIVE METABOLIC PANEL (CC13)
AST: 23 U/L (ref 5–34)
Albumin: 3.6 g/dL (ref 3.5–5.0)
Alkaline Phosphatase: 77 U/L (ref 40–150)
Glucose: 70 mg/dl (ref 70–99)
Potassium: 4.3 mEq/L (ref 3.5–5.1)
Sodium: 143 mEq/L (ref 136–145)
Total Protein: 6.8 g/dL (ref 6.4–8.3)

## 2012-05-20 NOTE — Telephone Encounter (Signed)
gv pt appt schedule for September.  °

## 2012-05-20 NOTE — Progress Notes (Signed)
This office note has been dictated.  #[id number]

## 2012-05-21 ENCOUNTER — Telehealth: Payer: Self-pay | Admitting: Cardiovascular Disease

## 2012-05-21 NOTE — Telephone Encounter (Signed)
Dr Alanda Amass referred her to Dr Arrie Aran really wants to see somebody else!

## 2012-05-21 NOTE — Progress Notes (Signed)
CC:   Darrin Nipper, MD Richard A. Alanda Amass, M.D. Larina Earthly, M.D.  PROBLEM LIST:  1. Chronic lymphocytic leukemia with onset probably in 2008 or 2009,  now with a confirmatory core needle biopsy of a left  supraclavicular lymph node on 11/11/2010 and confirmatory flow  cytometry on the peripheral blood carried out on 11/19/2010. The  patient has stage I disease at this time, with associated  thrombocytopenia and hypogammaglobulinemia, the latter first  detected in September 2008.  2. Thrombocytopenia.  3. Hypogammaglobulinemia.  4. Left carotid artery calcification and stenosis noted on CT  scan of the neck on 11/05/2010. Left carotid endarterectomy with Dacron patch angioplasty by Dr. Gretta Began on June 09, 2011.  5. Aortic stenosis.  6. Hypertension.  7. History of acute pancreatitis February 2002.  8. Status post cholecystectomy for gallstones and acute pancreatitis,  February 2002.   MEDICATIONS:  Reviewed and recorded. Current Outpatient Prescriptions  Medication Sig Dispense Refill  . acetaminophen (TYLENOL) 500 MG tablet Take 1,000 mg by mouth every 6 (six) hours as needed. pain       . aspirin EC 81 MG tablet Take 81 mg by mouth daily.        . Calcium-Vitamin D 500-125 MG-UNIT TABS Take 1 tablet by mouth daily.       . Cyanocobalamin (VITAMIN B 12 PO) Take 1 tablet by mouth daily.       . diphenhydrAMINE (BENADRYL) 25 MG tablet Take 25 mg by mouth as needed for allergies or sleep.      Marland Kitchen lisinopril (PRINIVIL,ZESTRIL) 20 MG tablet Take 20 mg by mouth daily.        . metoprolol succinate (TOPROL-XL) 50 MG 24 hr tablet Take 50 mg by mouth daily. Take 1 1/2 tab.Take with or immediately following a meal.      . Multiple Vitamins-Minerals (MULTIVITAMINS THER. W/MINERALS) TABS Take 1 tablet by mouth daily.         No current facility-administered medications for this visit.   IMMUNIZATIONS:  -Pneumovax given in December 2012   -Flu shot given December 2012 and September  2013.   SMOKING HISTORY: The patient has never smoked cigarettes.    HISTORY:  I saw Nicole Mercado today for followup of her chronic lymphocytic leukemia associated with thrombocytopenia and hypergammaglobulinemia.  Onset of patient's disease probably goes back to 2008 or 2009.  Jasa was last seen by [place] on 01/22/2012 and prior to that on 09/23/2011.  She is accompanied today by her significant other, [employer].  Vernell is without any complaints today.  She may be having some hot flashes from being postmenopausal as she stopped her estrogen therapy. She does have some easy bruising, but denies any recent changes.  She feels well with no lack of energy.  Appetite is good.  No intercurrent medical problems.  PHYSICAL EXAM:  She looks well.  Weight is stable at 99 pounds 14.4 ounces.  Height 5 feet even.  Body surface area 1.38 m2.  Blood pressure today in the left arm 178/44, in the right arm 149/47.  Shery was informed about her elevated blood pressure.  Other vital signs are normal.  There is no scleral icterus.  Mouth and pharynx are benign. There is no peripheral adenopathy palpable in any of the lymph node groups.  Lungs:  Clear to percussion and auscultation.  Cardiac: Regular rhythm with systolic ejection murmur, consistent with her known diagnosis of aortic stenosis.  I did not hear a diastolic murmur.  Abdomen:  Benign with no organomegaly or masses palpable.  Breasts:  Not examined.  Extremities:  No peripheral edema or clubbing.  Neurologic: Normal.  Skin:  The patient does have some seborrheic keratoses which are crusted and a couple of them look a little inflamed.  LABORATORY DATA:  White count 24.6 with an ANC of 21.1.  Hemoglobin 11.6, hematocrit 35.3, platelets 74,000.  There are 7% neutrophils and 86% lymphs.  CBC is fairly stable although there may be slight drift in the platelet count which today is 74,000.  Chemistries today are notable for a BUN of 1.2 as  compared with 1.0 on 01/22/2012.  BUN 19.  Albumin 3.6, LDH 217.  It will be recalled the quantitative immunoglobulins from 09/16/2006 were low.  IgG was 556, IgA 44, and IgM was 18.  IMAGING STUDIES:  1. CT scan of the neck with IV contrast on 11/05/2010 showed  generalized bilateral cervical and supraclavicular lymphadenopathy,  maximal on the left at levels III and IV. The left supraclavicular  lymph node at level IV measured 16 x 18 x 32 mm overall. Small  nodes seen in the superior mediastinum were felt to be normal.  There was felt to be increased number of the axillary lymph nodes  that were partially visible on series 3, image 20. Thyroid was  negative. There was coarse circumferential left carotid calcified  atherosclerosis with suspected hemodynamically significant  stenosis.  2. Ultrasound-guided cervical lymph node biopsy was carried out on  11/11/2010.  3. Chest x-ray, 2 view, from 03/21/2011 was normal.  4. Myocardial perfusion study carried out on 04/18/2011 showed normal  perfusion pattern and no EKG changes.   IMPRESSION AND PLAN:  The patient's clinical status remains essentially stable.  Of note today is the platelet count of 74,000 and the creatinine of 1.2.  The patient is under observation, and does not require any therapy at this time.  The patient was informed about her elevated blood pressure.  I have asked the patient to return in 4 months at which time we will check CBC, chemistries, including an LDH and recheck her quantitative immunoglobulins.    ______________________________ Samul Dada, M.D. DSM/MEDQ  D:  05/20/2012  T:  05/21/2012  Job:  [id number]

## 2012-05-25 ENCOUNTER — Encounter: Payer: Medicare Other | Admitting: Cardiothoracic Surgery

## 2012-05-28 NOTE — Telephone Encounter (Signed)
Pt. Informed that Dr. Alanda Amass was informed of pt. Desire to see another physcian . Pt. Stated even if I  Needed anything done I would be going to [facility]

## 2012-06-03 ENCOUNTER — Other Ambulatory Visit: Payer: Self-pay | Admitting: Dermatology

## 2012-07-12 ENCOUNTER — Encounter: Payer: Self-pay | Admitting: Vascular Surgery

## 2012-07-13 ENCOUNTER — Encounter: Payer: Self-pay | Admitting: Vascular Surgery

## 2012-07-13 ENCOUNTER — Other Ambulatory Visit (INDEPENDENT_AMBULATORY_CARE_PROVIDER_SITE_OTHER): Payer: Medicare Other | Admitting: Vascular Surgery

## 2012-07-13 ENCOUNTER — Ambulatory Visit (INDEPENDENT_AMBULATORY_CARE_PROVIDER_SITE_OTHER): Payer: Medicare Other | Admitting: Vascular Surgery

## 2012-07-13 DIAGNOSIS — I6529 Occlusion and stenosis of unspecified carotid artery: Secondary | ICD-10-CM

## 2012-07-13 DIAGNOSIS — Z48812 Encounter for surgical aftercare following surgery on the circulatory system: Secondary | ICD-10-CM

## 2012-07-13 NOTE — Progress Notes (Signed)
The patient presents today for followup of her left carotid endarterectomy in June of 2013. She had no neurologic deficits. 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 She specifically denies amaurosis fugax, transient ischemic attack or stroke. She has no new cardiac difficulties.  Past Medical History  Diagnosis Date  . Thrombocytopenia 11/19/2010  . Hypertension   . 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 Arthritis   . Heart murmur   . Pancreatitis     h/o  . Carotid artery occlusion     History  Substance Use Topics  . Smoking status: Never Smoker   . Smokeless tobacco: Never Used  . Alcohol Use: No    Family History  Problem Relation Age of Onset  . Heart disease Mother   . Hypertension Mother   . Heart disease Father   . Hypertension Father     No Known Allergies  Current outpatient prescriptions:acetaminophen (TYLENOL) 500 MG tablet, Take 1,000 mg by mouth every 6 (six) hours as needed. pain , Disp: , Rfl: ;  aspirin EC 81 MG tablet, Take 81 mg by mouth daily.  , Disp: , Rfl: ;  Calcium-Vitamin D 500-125 MG-UNIT TABS, Take 1 tablet by mouth daily. , Disp: , Rfl: ;  Cyanocobalamin (VITAMIN B 12 PO), Take 1 tablet by mouth daily. , Disp: , Rfl:  diphenhydrAMINE (BENADRYL) 25 MG tablet, Take 25 mg by mouth as needed for allergies or sleep., Disp: , Rfl: ;  lisinopril (PRINIVIL,ZESTRIL) 20 MG tablet, Take 20 mg by mouth daily.  , Disp: , Rfl: ;  metoprolol succinate (TOPROL-XL) 50 MG 24 hr tablet, Take 50 mg by mouth daily. Take 1 1/2 tab.Take with or immediately following a meal., Disp: , Rfl:  Multiple Vitamins-Minerals (MULTIVITAMINS THER. W/MINERALS) TABS, Take 1 tablet by mouth daily.  , Disp: , Rfl:   BP 175/76  Pulse 58  Resp 18  Ht 5' (1.524 m)  Wt 101 lb 12.8 oz (46.176 kg)  BMI 19.88 kg/m2  Body mass index is 19.88 kg/(m^2).       Physical exam: Well-developed well-nourished white  female no acute distress Neurologically she is grossly intact Respirations are clear and equal without wheezes Heart regular rate and rhythm with a loud systolic murmur Carotid arteries with possible bruit bilaterally versus transmission of heart murmur Left neck incision well healed Skin without ulcers or rashes  Carotid duplex in our office today reveals a mild irregularity in the internal carotid artery on the right with no flow limiting stenosis. The left endarterectomy also social intimal hyperplasia which is minimal and the patch with some moderate narrowing at the distal patch site.  Impression and plan: Stable followup one year after left carotid endarterectomy. The patient will continue usual activities. We will see her again in one year for continued surveillance. She'll notify should she develop any neurologic deficits

## 2012-07-13 NOTE — Addendum Note (Signed)
Addended by: Sharee Pimple on: 07/13/2012 11:17 AM   Modules accepted: Orders

## 2012-09-03 ENCOUNTER — Telehealth: Payer: Self-pay | Admitting: Cardiovascular Disease

## 2012-09-17 ENCOUNTER — Telehealth: Payer: Self-pay | Admitting: Internal Medicine

## 2012-09-17 NOTE — Telephone Encounter (Signed)
Returned pt's call re r/s 9/16 appt. Pt informed that her message was received and 9/16 appt cx'd. Pt asked to call back to r/s.

## 2012-09-21 ENCOUNTER — Other Ambulatory Visit: Payer: Medicare Other | Admitting: Lab

## 2012-09-21 ENCOUNTER — Ambulatory Visit: Payer: Medicare Other

## 2013-02-21 ENCOUNTER — Other Ambulatory Visit: Payer: Self-pay | Admitting: Vascular Surgery

## 2013-02-21 DIAGNOSIS — I6529 Occlusion and stenosis of unspecified carotid artery: Secondary | ICD-10-CM

## 2013-05-02 NOTE — Telephone Encounter (Signed)
This encounter is closed. 

## 2013-06-01 ENCOUNTER — Telehealth: Payer: Self-pay | Admitting: Oncology

## 2013-06-01 NOTE — Telephone Encounter (Signed)
s.w. pt to sched appt.Marland KitchenMarland KitchenMarland KitchenMarland Kitchenpt expressed that she did not want/need an appt...did not sched appt

## 2013-07-06 ENCOUNTER — Other Ambulatory Visit: Payer: Self-pay | Admitting: Dermatology

## 2013-07-12 ENCOUNTER — Ambulatory Visit: Payer: Medicare Other | Admitting: Vascular Surgery

## 2013-07-12 ENCOUNTER — Other Ambulatory Visit (HOSPITAL_COMMUNITY): Payer: Medicare Other

## 2013-08-17 ENCOUNTER — Encounter: Payer: Self-pay | Admitting: Family

## 2013-08-18 ENCOUNTER — Ambulatory Visit (HOSPITAL_COMMUNITY)
Admission: RE | Admit: 2013-08-18 | Discharge: 2013-08-18 | Disposition: A | Discharge status: Home / Self Care | Payer: Medicare Other | Source: Ambulatory Visit | Attending: Family | Admitting: Family

## 2013-08-18 ENCOUNTER — Ambulatory Visit (INDEPENDENT_AMBULATORY_CARE_PROVIDER_SITE_OTHER): Payer: Medicare Other | Admitting: Family

## 2013-08-18 ENCOUNTER — Encounter: Payer: Self-pay | Admitting: Family

## 2013-08-18 VITALS — BP 150/80 | HR 55 | Resp 14 | Ht 60.0 in | Wt 102.0 lb

## 2013-08-18 DIAGNOSIS — Z48812 Encounter for surgical aftercare following surgery on the circulatory system: Secondary | ICD-10-CM

## 2013-08-18 DIAGNOSIS — I6529 Occlusion and stenosis of unspecified carotid artery: Secondary | ICD-10-CM | POA: Diagnosis not present

## 2013-08-18 NOTE — Progress Notes (Signed)
Established Carotid Patient   History of Present Illness  Nicole Mercado is a 73 y.o. female patient of Dr. Donnetta Hutching who presents today for followup of her left carotid endarterectomy in June of 2013.   Patient has Negative history of TIA or stroke symptom.  The patient denies amaurosis fugax or monocular blindness.  The patient  denies facial drooping.  Pt. denies hemiplegia.  The patient denies receptive or expressive aphasia.  Pt. denies extremity weakness. After walking about 10 minutes on an incline her left anterior thigh "feels like it's going to give out", relieved by rest, denies non healing wounds, denies any other claudication areas or sx's.  Pt denies New Medical or Surgical History.  Pt Diabetic: No Pt smoker: non-smoker  Pt meds include: Statin : No, states her cholesterol is checked by Urgent care which is her PCP ASA: Yes Other anticoagulants/antiplatelets: no   Past Medical History  Diagnosis Date  . Thrombocytopenia 11/19/2010  . Hypertension   . 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 Arthritis   . Heart murmur   . Pancreatitis     h/o  . Carotid artery occlusion     Social History History  Substance Use Topics  . Smoking status: Never Smoker   . Smokeless tobacco: Never Used  . Alcohol Use: No    Family History Family History  Problem Relation Age of Onset  . Heart disease Mother   . Hypertension Mother   . Heart attack Mother   . Heart disease Father   . Hypertension Father   . Hypertension Sister   . Hypertension Son     Surgical History Past Surgical History  Procedure Laterality Date  . Cesarean section      FIVE  . Cholecystectomy    . Appendectomy    . Abdominal hysterectomy    . Tonsillectomy    . Endarterectomy  06/09/2011    Procedure: ENDARTERECTOMY CAROTID;  Surgeon: Rosetta Posner, MD;  Location: [facility] OR;  Service: Vascular;  Laterality: Left;  left carotid endarterectomy with patch angioplasty  . Carotid endarterectomy   06/09/11    LEFT  cea    No Known Allergies  Current Outpatient Prescriptions  Medication Sig Dispense Refill  . acetaminophen (TYLENOL) 500 MG tablet Take 1,000 mg by mouth every 6 (six) hours as needed. pain       . aspirin EC 81 MG tablet Take 81 mg by mouth daily.        . Calcium-Vitamin D 500-125 MG-UNIT TABS Take 1 tablet by mouth daily.       . Cyanocobalamin (VITAMIN B 12 PO) Take 1 tablet by mouth daily.       . diphenhydrAMINE (BENADRYL) 25 MG tablet Take 25 mg by mouth as needed for allergies or sleep.      Marland Kitchen lisinopril (PRINIVIL,ZESTRIL) 20 MG tablet Take 20 mg by mouth daily.        . metoprolol succinate (TOPROL-XL) 50 MG 24 hr tablet Take 50 mg by mouth daily. Take 1 1/2 tab.Take with or immediately following a meal.      . Multiple Vitamins-Minerals (MULTIVITAMINS THER. W/MINERALS) TABS Take 1 tablet by mouth daily.         No current facility-administered medications for this visit.    Review of Systems : See HPI for pertinent positives and negatives.  Physical Examination  Filed Vitals:   08/18/13 1038 08/18/13 1046  BP: 153/64 150/80  Pulse: 53 55  Resp:  14  Height:  5' (1.524 m)  Weight:  102 lb (46.267 kg)  SpO2:  100%   Body mass index is 19.92 kg/(m^2).  General: WDWN female in NAD GAIT: normal Eyes: PERRLA Pulmonary:  Non-labored, CTAB, Negative  Rales, Negative rhonchi, & Negative wheezing.  Cardiac: regular Rhythm,  prominent murmur.  VASCULAR EXAM Carotid Bruits Right Left   Transmitted cardiac murmur Transmitted cardiac murmur   Aorta is not palpable  Radial pulses are 1+ palpable and equal.                                                                                                                            LE Pulses Right Left       FEMORAL  2+ palpable  2+ palpable        POPLITEAL  not palpable   not palpable       POSTERIOR TIBIAL  not palpable   not palpable        DORSALIS PEDIS      ANTERIOR TIBIAL not palpable  not  palpable     Gastrointestinal: soft, nontender, BS WNL, no r/g,  negative masses.  Musculoskeletal: Negative muscle atrophy/wasting. M/S 4/5 throughout, Extremities without ischemic changes. Feet appear well perfused.  Neurologic: A&O X 3; Appropriate Affect ; SENSATION ;normal;  Speech is normal CN 2-12 intact, Pain and light touch intact in extremities, Motor exam as listed above.   Non-Invasive Vascular Imaging CAROTID DUPLEX 08/18/2013   CEREBROVASCULAR DUPLEX EVALUATION    INDICATION: Carotid artery disease     PREVIOUS INTERVENTION(S): Left carotid endarterectomy 06/09/2011.    DUPLEX EXAM:     RIGHT  LEFT  Peak Systolic Velocities (cm/s) End Diastolic Velocities (cm/s) Plaque LOCATION Peak Systolic Velocities (cm/s) End Diastolic Velocities (cm/s) Plaque  85 20  CCA PROXIMAL 73 14   61 22  CCA MID 72 13   65 21 HT CCA DISTAL 79 12   45 (mid) 6 HT ECA 300 25   103 29 HT ICA PROXIMAL 55 5   101 29  ICA MID 108 26   123 31  ICA DISTAL 107 31     2.01 ICA / CCA Ratio (PSV)   Antegrade  Vertebral Flow Antegrade   010 Brachial Systolic Pressure (mmHg) 932  Triphasic  Brachial Artery Waveforms Triphasic     Plaque Morphology:  HM = Homogeneous, HT = Heterogeneous, CP = Calcific Plaque, SP = Smooth Plaque, IP = Irregular Plaque     ADDITIONAL FINDINGS:     IMPRESSION: Right internal carotid artery velocities suggest a <40% stenosis.  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 Patent left carotid endarterectomy site with evidence of minimal hyperplasia at the proximal and distal patch sites with velocities suggesting a <40% stenosis.     Compared to the previous exam:  No significant change in comparison to the last exam on 07/13/2012.     Assessment: Nicole Mercado is  a 73 y.o. female who is s/p left carotid endarterectomy in June of 2013. Carotid Duplex today demonstrates right internal carotid artery with <40% stenosis, proximal occlusion of the right  external carotid artery, and patent left carotid endarterectomy site with evidence of minimal hyperplasia at the proximal and distal patch sites with velocities suggesting a <40% stenosis.  No significant change in comparison to the last exam on 07/13/2012.  She has mild claudication symptoms in her left anterior thigh, feet appear well perfused but pedal pulses are not palpable. Will check ABI's on her return in a year for carotid Duplex. Fortunately she does not have DM, she has never smoked, and has a normal BMI.  Plan: Follow-up in 1 year with Carotid Duplex scan and ABI's.   I discussed in depth with the patient the nature of atherosclerosis, and emphasized the importance of maximal medical management including strict control of blood pressure, blood glucose, and lipid levels, obtaining regular exercise, and continued cessation of smoking.  The patient is aware that without maximal medical management the underlying atherosclerotic disease process will progress, limiting the benefit of any interventions. The patient was given information about stroke prevention and what symptoms should prompt the patient to seek immediate medical care. Thank you for allowing [place] to participate in this patient's care.  Clemon Chambers, RN, MSN, FNP-C [facility] Office: [phone]  Clinic Physician: Early  08/18/2013 10:48 AM

## 2013-08-18 NOTE — Patient Instructions (Signed)
Stroke Prevention Some medical conditions and behaviors are associated with an increased chance of having a stroke. You may prevent a stroke by making healthy choices and managing medical conditions. HOW CAN I REDUCE MY RISK OF HAVING A STROKE?   Stay physically active. Get at least 30 minutes of activity on most or all days.  Do not smoke. It may also be helpful to avoid exposure to secondhand smoke.  Limit alcohol use. Moderate alcohol use is considered to be:  No more than 2 drinks per day for men.  No more than 1 drink per day for nonpregnant women.  Eat healthy foods. This involves:  Eating 5 or more servings of fruits and vegetables a day.  Making dietary changes that address high blood pressure (hypertension), high cholesterol, diabetes, or obesity.  Manage your cholesterol levels.  Making food choices that are high in fiber and low in saturated fat, trans fat, and cholesterol may control cholesterol levels.  Take any prescribed medicines to control cholesterol as directed by your health care provider.  Manage your diabetes.  Controlling your carbohydrate and sugar intake is recommended to manage diabetes.  Take any prescribed medicines to control diabetes as directed by your health care provider.  Control your hypertension.  Making food choices that are low in salt (sodium), saturated fat, trans fat, and cholesterol is recommended to manage hypertension.  Take any prescribed medicines to control hypertension as directed by your health care provider.  Maintain a healthy weight.  Reducing calorie intake and making food choices that are low in sodium, saturated fat, trans fat, and cholesterol are recommended to manage weight.  Stop drug abuse.  Avoid taking birth control pills.  Talk to your health care provider about the risks of taking birth control pills if you are over 35 years old, smoke, get migraines, or have ever had a blood clot.  Get evaluated for sleep  disorders (sleep apnea).  Talk to your health care provider about getting a sleep evaluation if you snore a lot or have excessive sleepiness.  Take medicines only as directed by your health care provider.  For some people, aspirin or blood thinners (anticoagulants) are helpful in reducing the risk of forming abnormal blood clots that can lead to stroke. If you have the irregular heart rhythm of atrial fibrillation, you should be on a blood thinner unless there is a good reason you cannot take them.  Understand all your medicine instructions.  Make sure that other conditions (such as anemia or atherosclerosis) are addressed. SEEK IMMEDIATE MEDICAL CARE IF:   You have sudden weakness or numbness of the face, arm, or leg, especially on one side of the body.  Your face or eyelid droops to one side.  You have sudden confusion.  You have trouble speaking (aphasia) or understanding.  You have sudden trouble seeing in one or both eyes.  You have sudden trouble walking.  You have dizziness.  You have a loss of balance or coordination.  You have a sudden, severe headache with no known cause.  You have new chest pain or an irregular heartbeat. Any of these symptoms may represent a serious problem that is an emergency. Do not wait to see if the symptoms will go away. Get medical help at once. Call your local emergency services (911 in [place] Do not drive yourself to the hospital. Document Released: 01/31/2004 Document Revised: 05/09/2013 Document Reviewed: 06/25/2012 [employer] Patient Information 2015 [employer]. This information is not intended to replace advice given   to you by your health care provider. Make sure you discuss any questions you have with your health care provider.  

## 2014-01-11 ENCOUNTER — Inpatient Hospital Stay (HOSPITAL_COMMUNITY)
Admission: EM | Admit: 2014-01-11 | Discharge: 2014-01-24 | DRG: 217 | Disposition: A | Discharge status: Skilled Nursing Facility | Payer: Medicare Other | Attending: Surgery | Admitting: Surgery

## 2014-01-11 ENCOUNTER — Emergency Department (HOSPITAL_COMMUNITY): Payer: Medicare Other

## 2014-01-11 ENCOUNTER — Encounter (HOSPITAL_COMMUNITY): Payer: Self-pay

## 2014-01-11 ENCOUNTER — Telehealth (HOSPITAL_BASED_OUTPATIENT_CLINIC_OR_DEPARTMENT_OTHER): Payer: Self-pay | Admitting: Emergency Medicine

## 2014-01-11 DIAGNOSIS — N183 Chronic kidney disease, stage 3 (moderate): Secondary | ICD-10-CM | POA: Diagnosis present

## 2014-01-11 DIAGNOSIS — I951 Orthostatic hypotension: Secondary | ICD-10-CM

## 2014-01-11 DIAGNOSIS — D801 Nonfamilial hypogammaglobulinemia: Secondary | ICD-10-CM

## 2014-01-11 DIAGNOSIS — W19XXXA Unspecified fall, initial encounter: Secondary | ICD-10-CM | POA: Diagnosis present

## 2014-01-11 DIAGNOSIS — D72829 Elevated white blood cell count, unspecified: Secondary | ICD-10-CM | POA: Diagnosis present

## 2014-01-11 DIAGNOSIS — C9110 Chronic lymphocytic leukemia of B-cell type not having achieved remission: Secondary | ICD-10-CM

## 2014-01-11 DIAGNOSIS — Z7982 Long term (current) use of aspirin: Secondary | ICD-10-CM

## 2014-01-11 DIAGNOSIS — S0101XA Laceration without foreign body of scalp, initial encounter: Secondary | ICD-10-CM | POA: Diagnosis present

## 2014-01-11 DIAGNOSIS — T380X5A Adverse effect of glucocorticoids and synthetic analogues, initial encounter: Secondary | ICD-10-CM | POA: Diagnosis present

## 2014-01-11 DIAGNOSIS — I480 Paroxysmal atrial fibrillation: Secondary | ICD-10-CM | POA: Diagnosis not present

## 2014-01-11 DIAGNOSIS — N189 Chronic kidney disease, unspecified: Secondary | ICD-10-CM

## 2014-01-11 DIAGNOSIS — I6529 Occlusion and stenosis of unspecified carotid artery: Secondary | ICD-10-CM | POA: Diagnosis present

## 2014-01-11 DIAGNOSIS — Y92002 Bathroom of unspecified non-institutional (private) residence single-family (private) house as the place of occurrence of the external cause: Secondary | ICD-10-CM | POA: Diagnosis not present

## 2014-01-11 DIAGNOSIS — Y93E1 Activity, personal bathing and showering: Secondary | ICD-10-CM | POA: Diagnosis not present

## 2014-01-11 DIAGNOSIS — I499 Cardiac arrhythmia, unspecified: Secondary | ICD-10-CM | POA: Diagnosis present

## 2014-01-11 DIAGNOSIS — I2510 Atherosclerotic heart disease of native coronary artery without angina pectoris: Secondary | ICD-10-CM | POA: Diagnosis present

## 2014-01-11 DIAGNOSIS — Z79899 Other long term (current) drug therapy: Secondary | ICD-10-CM | POA: Diagnosis not present

## 2014-01-11 DIAGNOSIS — D630 Anemia in neoplastic disease: Secondary | ICD-10-CM | POA: Diagnosis present

## 2014-01-11 DIAGNOSIS — I359 Nonrheumatic aortic valve disorder, unspecified: Secondary | ICD-10-CM

## 2014-01-11 DIAGNOSIS — I129 Hypertensive chronic kidney disease with stage 1 through stage 4 chronic kidney disease, or unspecified chronic kidney disease: Secondary | ICD-10-CM | POA: Diagnosis present

## 2014-01-11 DIAGNOSIS — K5900 Constipation, unspecified: Secondary | ICD-10-CM | POA: Diagnosis not present

## 2014-01-11 DIAGNOSIS — E8770 Fluid overload, unspecified: Secondary | ICD-10-CM | POA: Diagnosis not present

## 2014-01-11 DIAGNOSIS — N390 Urinary tract infection, site not specified: Secondary | ICD-10-CM | POA: Diagnosis present

## 2014-01-11 DIAGNOSIS — B9620 Unspecified Escherichia coli [E. coli] as the cause of diseases classified elsewhere: Secondary | ICD-10-CM | POA: Diagnosis present

## 2014-01-11 DIAGNOSIS — D696 Thrombocytopenia, unspecified: Secondary | ICD-10-CM | POA: Diagnosis not present

## 2014-01-11 DIAGNOSIS — R011 Cardiac murmur, unspecified: Secondary | ICD-10-CM | POA: Diagnosis not present

## 2014-01-11 DIAGNOSIS — I350 Nonrheumatic aortic (valve) stenosis: Principal | ICD-10-CM

## 2014-01-11 DIAGNOSIS — R55 Syncope and collapse: Secondary | ICD-10-CM | POA: Diagnosis present

## 2014-01-11 DIAGNOSIS — Z952 Presence of prosthetic heart valve: Secondary | ICD-10-CM

## 2014-01-11 DIAGNOSIS — D6959 Other secondary thrombocytopenia: Secondary | ICD-10-CM | POA: Diagnosis present

## 2014-01-11 LAB — BASIC METABOLIC PANEL
Anion gap: 8 (ref 5–15)
BUN: 21 mg/dL (ref 6–23)
CO2: 26 mmol/L (ref 19–32)
CREATININE: 1.23 mg/dL — AB (ref 0.50–1.10)
Calcium: 10.1 mg/dL (ref 8.4–10.5)
Chloride: 106 mEq/L (ref 96–112)
GFR calc non Af Amer: 42 mL/min — ABNORMAL LOW (ref >90)
GFR, EST AFRICAN AMERICAN: 49 mL/min — AB (ref >90)
Glucose, Bld: 112 mg/dL — ABNORMAL HIGH (ref 70–99)
POTASSIUM: 4.8 mmol/L (ref 3.5–5.1)
SODIUM: 140 mmol/L (ref 135–145)

## 2014-01-11 LAB — CBC WITH DIFFERENTIAL/PLATELET
BASOS ABS: 0 10*3/uL (ref 0.0–0.1)
BASOS PCT: 0 % (ref 0–1)
EOS PCT: 1 % (ref 0–5)
Eosinophils Absolute: 0.1 10*3/uL (ref 0.0–0.7)
HCT: 32.7 % — ABNORMAL LOW (ref 36.0–46.0)
Hemoglobin: 11 g/dL — ABNORMAL LOW (ref 12.0–15.0)
LYMPHS PCT: 75 % — AB (ref 12–46)
Lymphs Abs: 11.2 10*3/uL — ABNORMAL HIGH (ref 0.7–4.0)
MCH: 31.8 pg (ref 26.0–34.0)
MCHC: 33.6 g/dL (ref 30.0–36.0)
MCV: 94.5 fL (ref 78.0–100.0)
MONO ABS: 1 10*3/uL (ref 0.1–1.0)
Monocytes Relative: 7 % (ref 3–12)
Neutro Abs: 2.5 10*3/uL (ref 1.7–7.7)
Neutrophils Relative %: 17 % — ABNORMAL LOW (ref 43–77)
Platelets: 44 10*3/uL — ABNORMAL LOW (ref 150–400)
RBC: 3.46 MIL/uL — ABNORMAL LOW (ref 3.87–5.11)
RDW: 14.3 % (ref 11.5–15.5)
WBC: 14.8 10*3/uL — AB (ref 4.0–10.5)

## 2014-01-11 LAB — URINE MICROSCOPIC-ADD ON

## 2014-01-11 LAB — URINALYSIS, ROUTINE W REFLEX MICROSCOPIC
Bilirubin Urine: NEGATIVE
GLUCOSE, UA: NEGATIVE mg/dL
KETONES UR: NEGATIVE mg/dL
Nitrite: POSITIVE — AB
PH: 7 (ref 5.0–8.0)
PROTEIN: NEGATIVE mg/dL
SPECIFIC GRAVITY, URINE: 1.012 (ref 1.005–1.030)
UROBILINOGEN UA: 0.2 mg/dL (ref 0.0–1.0)

## 2014-01-11 LAB — TSH: TSH: 0.705 u[IU]/mL (ref 0.350–4.500)

## 2014-01-11 LAB — SAVE SMEAR

## 2014-01-11 LAB — TROPONIN I: Troponin I: <0.03 ng/mL (ref <0.031)

## 2014-01-11 IMAGING — CT CT CERVICAL SPINE W/O CM
4 of 6 series · 14 of 33 positions shown, 16 images · non-contrast
Comparison: None

CLINICAL DATA: Fall in shower this morning with dizziness

EXAM:
CT HEAD WITHOUT CONTRAST
CT CERVICAL SPINE WITHOUT CONTRAST
TECHNIQUE: Multidetector CT imaging of the head and cervical spine was
performed following the standard protocol without intravenous
contrast. Multiplanar CT image reconstructions of the cervical spine
were also generated.

[Series 5: c_spine 2.0 i30s 3 · axial · 0.30mm/px · z∈[-234,-156]mm · 3 of 79 slices shown, 4 images]
[im 20/79  soft-tissue]
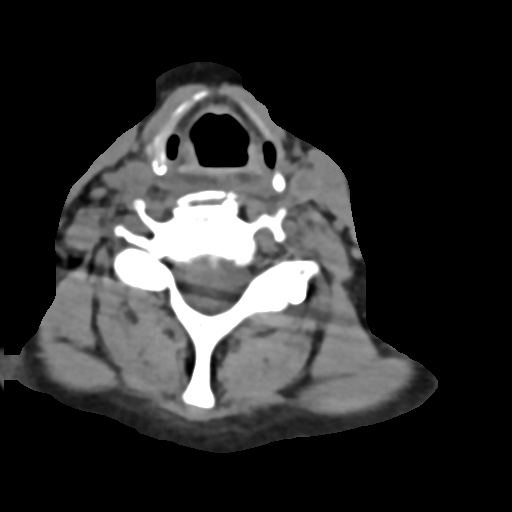
[im 20/79  bone]
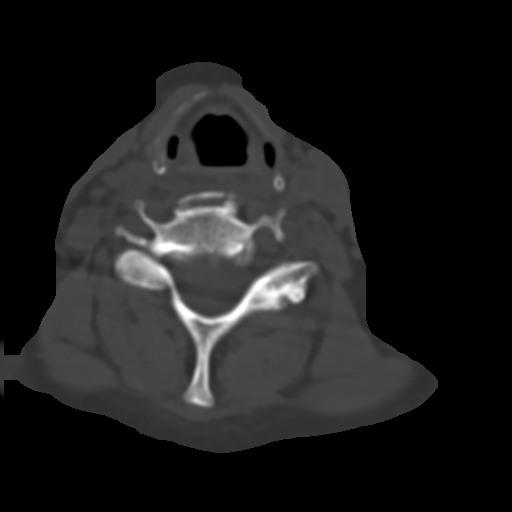
[im 40/79  bone]
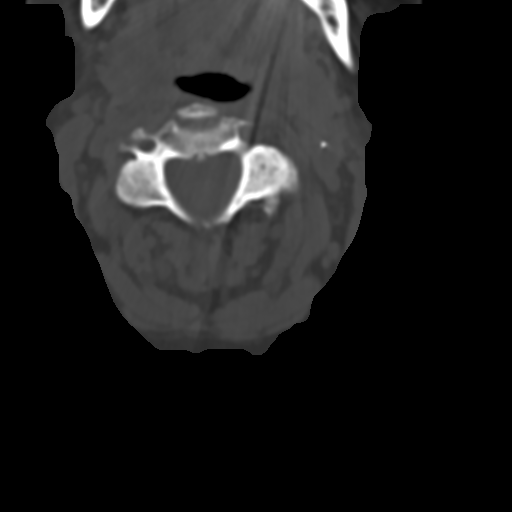
[im 59/79  bone]
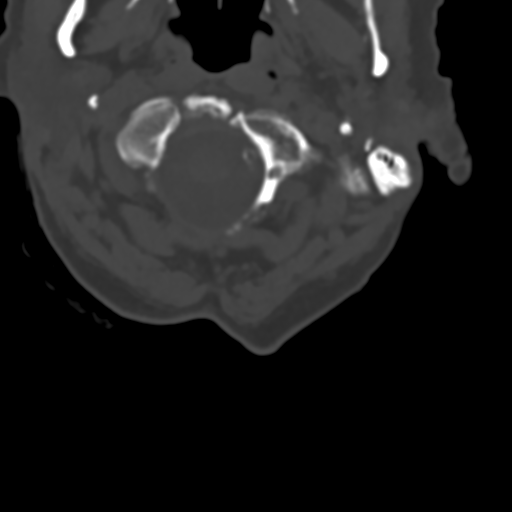

[Series 7: coronals · coronal · 0.23mm/px · 3 of 45 slices shown]
[im 9/45  bone]
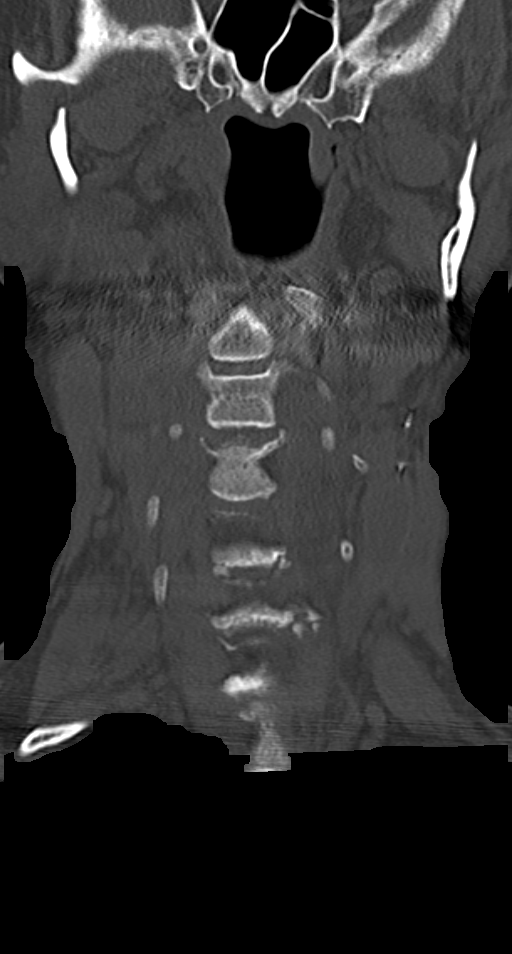
[im 18/45  bone]
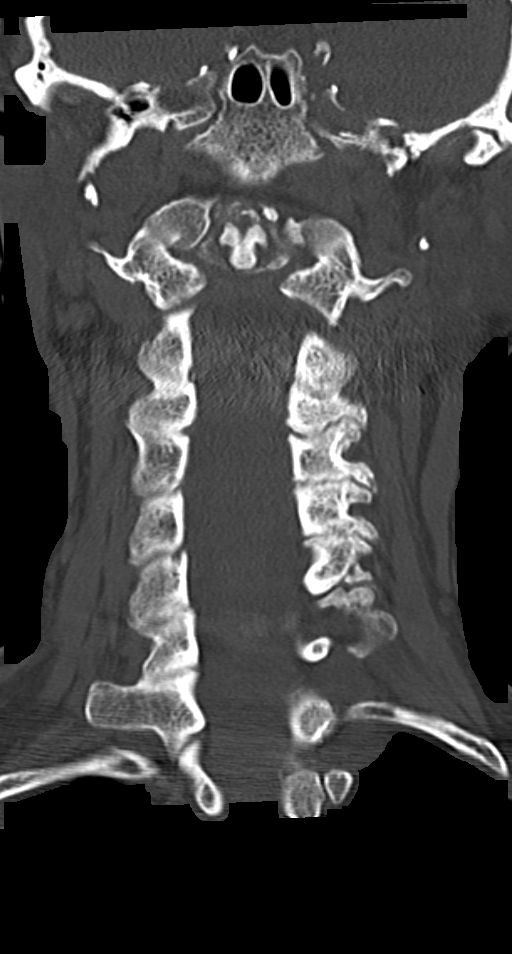
[im 27/45  bone]
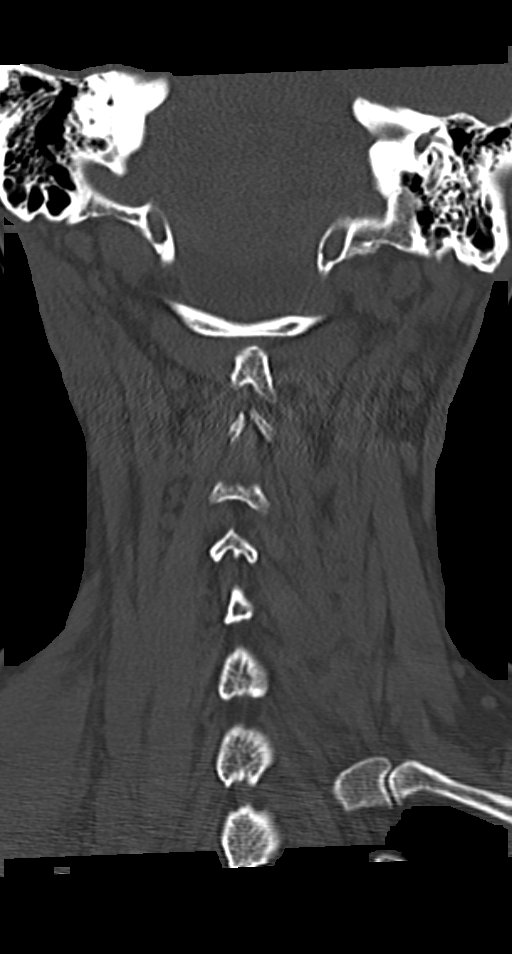

[Series 8: sagittals · sagittal · 0.33mm/px · 5 of 53 slices shown, 6 images]
[im 18/53  bone]
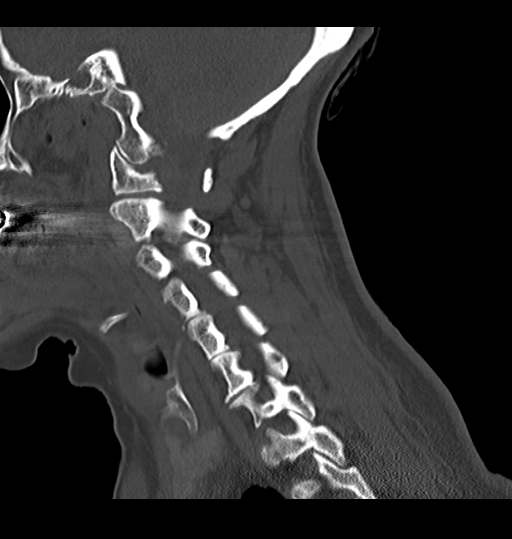
[im 22/53  bone]
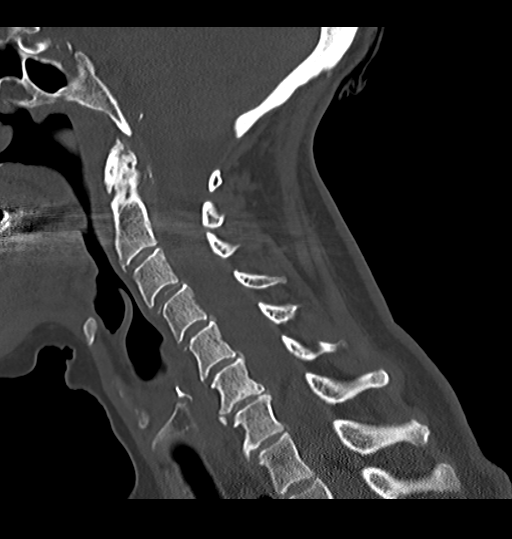
[im 27/53  soft-tissue]
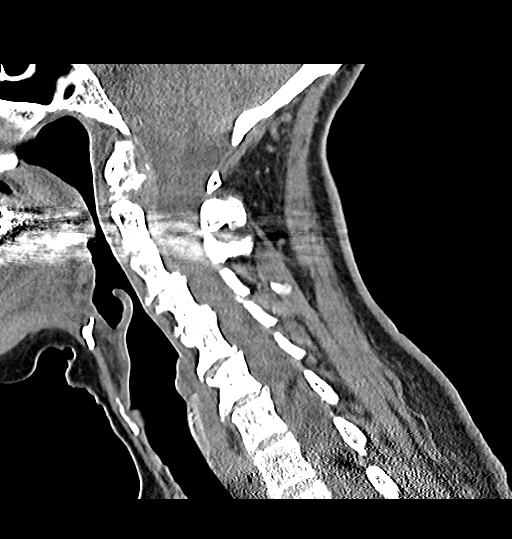
[im 27/53  bone]
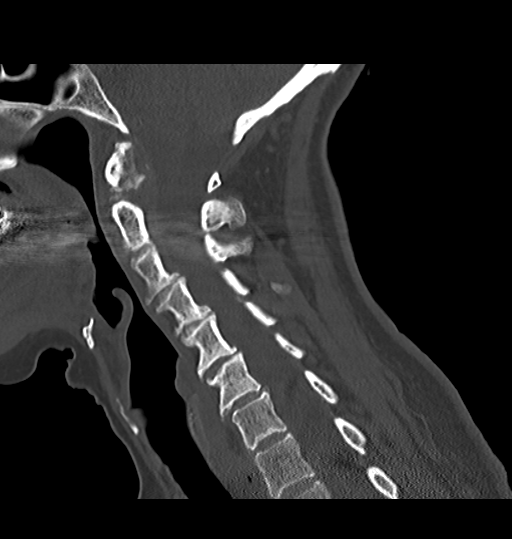
[im 31/53  bone]
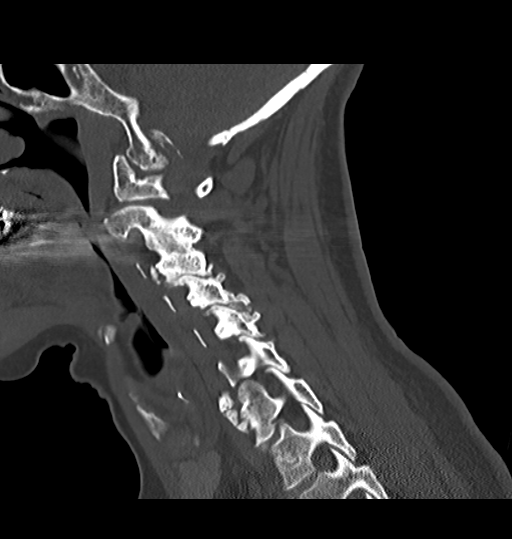
[im 35/53  bone]
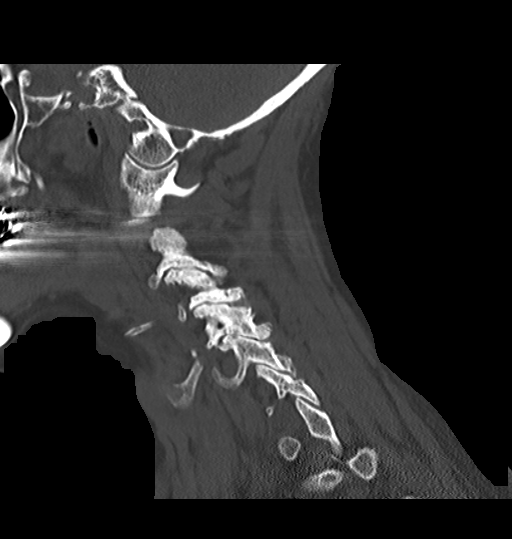

[Series 9: orthogonals · axial · 0.17mm/px · z∈[-279,-203]mm · 3 of 91 slices shown]
[im 23/91  bone]
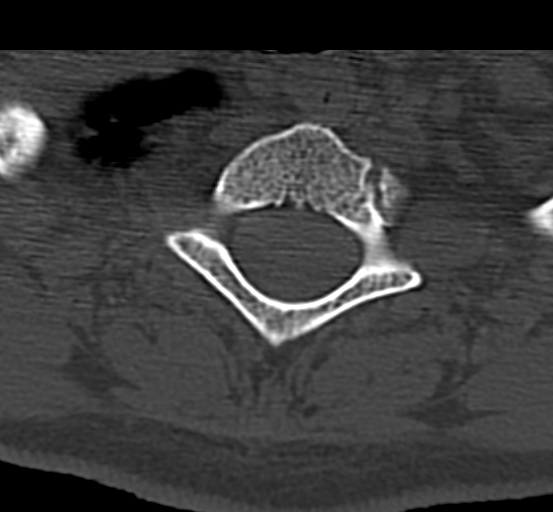
[im 46/91  bone]
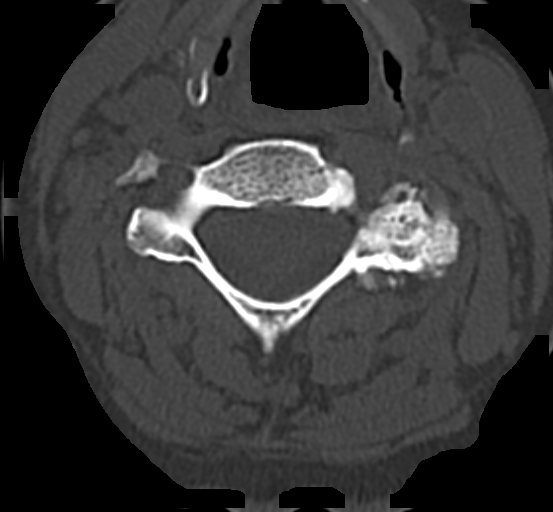
[im 68/91  bone]
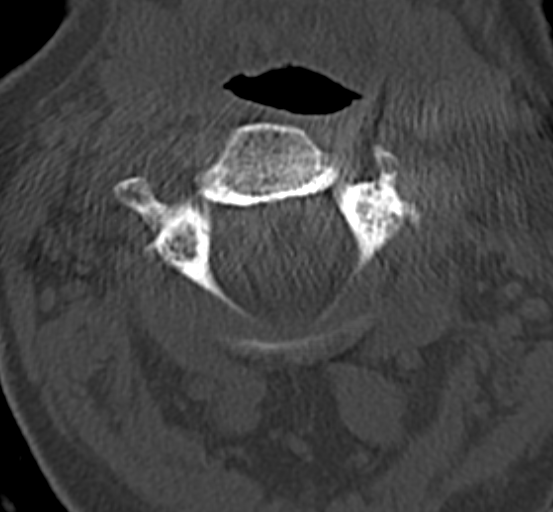

[14 of 33 positions shown; findings below may reference images not displayed]

FINDINGS: CT HEAD FINDINGS

The bony calvarium is intact. A scalp hematoma and soft tissue
laceration are noted in the right posterior parietal region. Mild
atrophic changes are noted. Mild chronic white matter ischemic
change is seen. No findings to suggest acute hemorrhage, acute
infarction or space-occupying mass lesion are noted.

CT CERVICAL SPINE FINDINGS

Seven cervical segments are well visualized. Vertebral body height
is well maintained. Multilevel osteophytic changes are noted as well
as facet hypertrophic changes. The hypertrophic facet changes are
predominately noted on the left. Calcifications of the carotid and
vertebral arteries are noted. No acute fracture or acute facet
abnormality is seen.
IMPRESSION: CT of the head: Soft tissue injury in the right parietal region
without acute intracranial abnormality.

CT of the cervical spine: Multilevel degenerative changes without
acute abnormality. The facet hypertrophic changes are predominantly
on the left.

## 2014-01-11 IMAGING — CR DG CHEST 1V PORT
1 series · 1 of 1 positions shown · non-contrast
Comparison: [DATE].

CLINICAL DATA: Syncopal episode.  Fall.

EXAM:
PORTABLE CHEST - 1 VIEW

[portable]
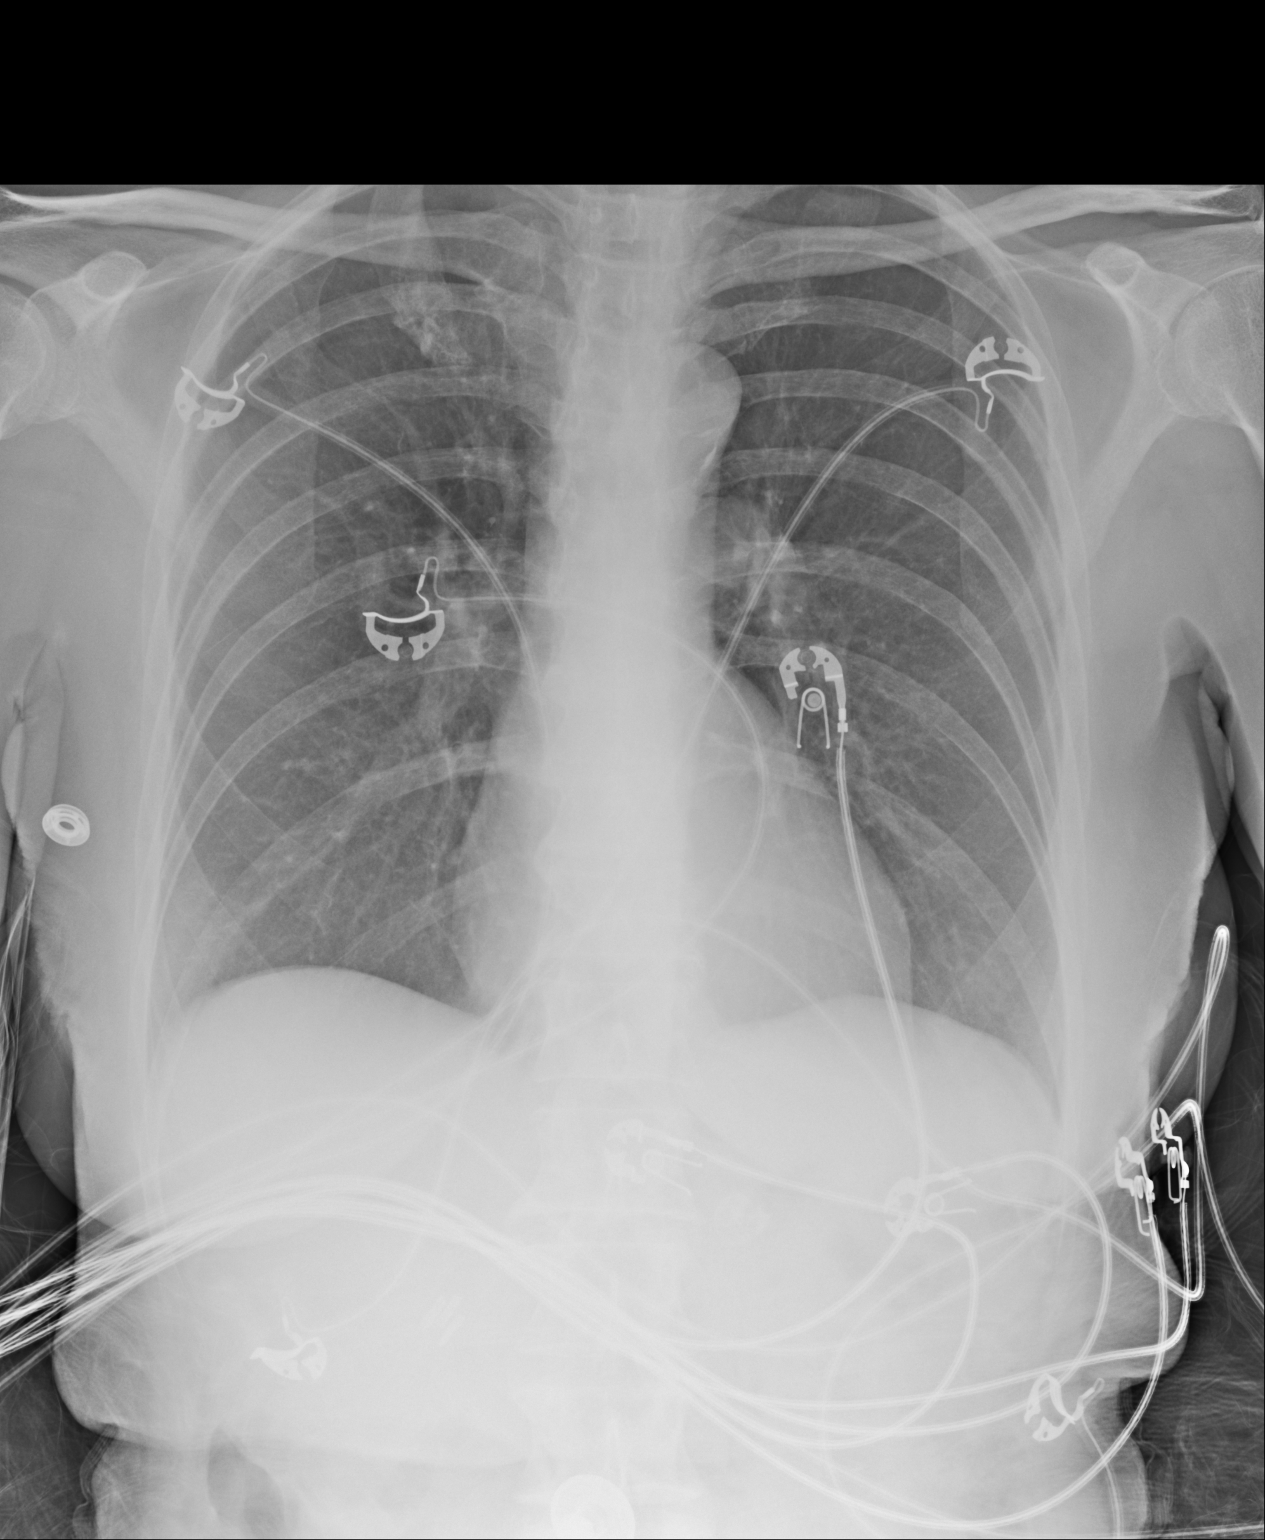

[1 of 1 positions shown; findings below may reference images not displayed]

FINDINGS: Cardiopericardial silhouette within normal limits. Stable prominent
costochondral calcification at the RIGHT first rib end. Monitoring
leads project over the chest. No airspace disease. No pleural
effusion. No displaced rib fracture or pneumothorax in this patient
with a fall. Aortic arch atherosclerosis.
IMPRESSION: No active cardiopulmonary disease and no interval change.

## 2014-01-11 MED ORDER — CEFTRIAXONE SODIUM IN DEXTROSE 20 MG/ML IV SOLN
1.0000 g | INTRAVENOUS | Status: DC
  Administered 2014-01-11 – 2014-01-13 (×3): 1 g via INTRAVENOUS
  Filled 2014-01-11 (×5): qty 50

## 2014-01-11 MED ORDER — ALUM & MAG HYDROXIDE-SIMETH 200-200-20 MG/5ML PO SUSP: 30.0000 mL | Freq: Four times a day (QID) | ORAL | Status: DC | PRN

## 2014-01-11 MED ORDER — PREDNISONE 50 MG PO TABS
60.0000 mg | ORAL_TABLET | Freq: Every day | ORAL | Status: DC
  Administered 2014-01-12 – 2014-01-16 (×5): 60 mg via ORAL
  Filled 2014-01-11 (×8): qty 1

## 2014-01-11 MED ORDER — METOPROLOL TARTRATE 25 MG PO TABS
25.0000 mg | ORAL_TABLET | Freq: Two times a day (BID) | ORAL | Status: DC
  Administered 2014-01-11 – 2014-01-16 (×12): 25 mg via ORAL
  Filled 2014-01-11 (×14): qty 1

## 2014-01-11 MED ORDER — ONDANSETRON HCL 4 MG PO TABS: 4.0000 mg | ORAL_TABLET | Freq: Four times a day (QID) | ORAL | Status: DC | PRN

## 2014-01-11 MED ORDER — SODIUM CHLORIDE 0.9 % IV SOLN: INTRAVENOUS | Status: DC

## 2014-01-11 MED ORDER — OXYCODONE HCL 5 MG PO TABS: 5.0000 mg | ORAL_TABLET | ORAL | Status: DC | PRN

## 2014-01-11 MED ORDER — SODIUM CHLORIDE 0.9 % IV BOLUS (SEPSIS)
1000.0000 mL | Freq: Once | INTRAVENOUS | Status: AC
  Administered 2014-01-11: 1000 mL via INTRAVENOUS

## 2014-01-11 MED ORDER — ACETAMINOPHEN 325 MG PO TABS: 650.0000 mg | ORAL_TABLET | Freq: Four times a day (QID) | ORAL | Status: DC | PRN

## 2014-01-11 MED ORDER — CYANOCOBALAMIN 1000 MCG/ML IJ SOLN
1000.0000 ug | Freq: Once | INTRAMUSCULAR | Status: AC
  Administered 2014-01-12: 1000 ug via INTRAMUSCULAR
  Filled 2014-01-11: qty 1

## 2014-01-11 MED ORDER — ACETAMINOPHEN 500 MG PO TABS
1000.0000 mg | ORAL_TABLET | Freq: Four times a day (QID) | ORAL | Status: DC | PRN
Start: 2014-01-11 — End: 2014-01-13

## 2014-01-11 MED ORDER — FOLIC ACID 1 MG PO TABS
1.0000 mg | ORAL_TABLET | Freq: Every day | ORAL | Status: DC
  Administered 2014-01-12 – 2014-01-16 (×5): 1 mg via ORAL
  Filled 2014-01-11 (×6): qty 1

## 2014-01-11 MED ORDER — METOPROLOL TARTRATE 25 MG PO TABS
25.0000 mg | ORAL_TABLET | Freq: Two times a day (BID) | ORAL | Status: DC
  Filled 2014-01-11 (×2): qty 1

## 2014-01-11 MED ORDER — ACETAMINOPHEN 650 MG RE SUPP: 650.0000 mg | Freq: Four times a day (QID) | RECTAL | Status: DC | PRN

## 2014-01-11 MED ORDER — SODIUM CHLORIDE 0.9 % IV SOLN
INTRAVENOUS | Status: DC
  Administered 2014-01-11: 12:00:00 via INTRAVENOUS

## 2014-01-11 MED ORDER — POLYETHYLENE GLYCOL 3350 17 G PO PACK
17.0000 g | PACK | Freq: Every day | ORAL | Status: DC | PRN
  Filled 2014-01-11: qty 1

## 2014-01-11 MED ORDER — ONDANSETRON HCL 4 MG/2ML IJ SOLN: 4.0000 mg | Freq: Four times a day (QID) | INTRAMUSCULAR | Status: DC | PRN

## 2014-01-11 MED ORDER — SODIUM CHLORIDE 0.9 % IJ SOLN
3.0000 mL | Freq: Two times a day (BID) | INTRAMUSCULAR | Status: DC
  Administered 2014-01-11 – 2014-01-16 (×9): 3 mL via INTRAVENOUS

## 2014-01-11 NOTE — ED Notes (Signed)
Gae Bon, RN on 2W aware of wound; pt enroute to floor

## 2014-01-11 NOTE — ED Notes (Signed)
Pt here for fall in shower this am, sts got lightheaded and dizzy large hematoma noted to back of head with bleeding per ems, initially hypotensive with ems.

## 2014-01-11 NOTE — Evaluation (Signed)
Physical Therapy Evaluation Patient Details Name: Nicole Mercado MRN: [id number] DOB: 1940-05-03 Today's Date: 01/11/2014   History of Present Illness  Nicole Mercado  is a 74 y.o. female, with a pmh of leukemia, thrombocytopenia, aortic stenosis, and carotid artery disease.  She describes feeling perfectly well until she was taking a shower this morning when she began to feel slightly off.  The next thing she recalls is waking up on the floor.  She called a friend to come help and he subsequently called EMS.  Mr. Nicole Mercado (the friend) reports that she mentioned to him she may have been leaning over washing her hair when she passed out.  [name] reports passing out once before several months ago.  She has not started any new medications recently, she denies vomiting, HA, double vision, chest pain, abdominal pain, changes in bowel habits or dysuria.  She states she bruises easily.    Clinical Impression  Pt admitted with the above complications. Pt currently with functional limitations due to the deficits listed below (see PT Problem List). See orthostatic vitals below. Pt symptomatic upon standing reporting lightheadedness. Limited ambulatory distance due to drop in BP. Recommending short term SNF due to fall risk and pt living alone however likely to progress to HHPT pending how she progresses medically. Pt will benefit from skilled PT to increase their independence and safety with mobility to allow discharge to the venue listed below.       Follow Up Recommendations SNF;Other (comment) (may progress to HHPT pending medical progression)    Equipment Recommendations   (TBD)    Recommendations for Other Services       Precautions / Restrictions Precautions Precautions: Fall Precaution Comments: syncope Restrictions Weight Bearing Restrictions: No      Mobility  Bed Mobility Overal bed mobility: Modified Independent                Transfers Overall transfer level: Needs  assistance Equipment used: None Transfers: Sit to/from Stand Sit to Stand: Supervision         General transfer comment: supervision for safety. Did not need physical assist. Reported lightheadedness upon standing.  Ambulation/Gait Ambulation/Gait assistance: Min guard Ambulation Distance (Feet): 12 Feet Assistive device: None Gait Pattern/deviations: Step-through pattern;Staggering left;Decreased stride length Gait velocity: decreased Gait velocity interpretation: Below normal speed for age/gender General Gait Details: Mildly guarded, mild stagger to pts Lt side but did not require assist to correct. Reported lightheadedness upon standing.  Stairs            Wheelchair Mobility    Modified Rankin (Stroke Patients Only)       Balance Overall balance assessment: Needs assistance;History of Falls Sitting-balance support: Feet supported;No upper extremity supported Sitting balance-Leahy Scale: Good     Standing balance support: No upper extremity supported Standing balance-Leahy Scale: Fair                               Pertinent Vitals/Pain Pain Assessment: No/denies pain   Supine: -BP 130/50 -HR 85  Sitting: -BP 115/37 -HR 89  Standing: -BP 85/38 -HR 110    Home Living Family/patient expects to be discharged to:: Private residence Living Arrangements: Alone Available Help at Discharge: Friend(s);Available PRN/intermittently Type of Home: Apartment Home Access: Level entry     Home Layout: One level Home Equipment: None      Prior Function Level of Independence: Independent  Hand Dominance   Dominant Hand: Right    Extremity/Trunk Assessment   Upper Extremity Assessment: Defer to OT evaluation           Lower Extremity Assessment: Overall WFL for tasks assessed         Communication   Communication: No difficulties  Cognition Arousal/Alertness: Awake/alert Behavior During Therapy: WFL for tasks  assessed/performed Overall Cognitive Status: Within Functional Limits for tasks assessed                      General Comments General comments (skin integrity, edema, etc.): States she had a similar episode of syncope a couple of months ago but attributed this to a "flu shot." Pt became lightheaded and diaphoretic upon standing (see orthostatic vitals in vitals tab.) Mild bleeding through bandage posterior scalp    Exercises        Assessment/Plan    PT Assessment Patient needs continued PT services  PT Diagnosis Difficulty walking;Abnormality of gait   PT Problem List Decreased activity tolerance;Decreased balance;Decreased mobility;Decreased knowledge of precautions;Cardiopulmonary status limiting activity  PT Treatment Interventions DME instruction;Gait training;Functional mobility training;Therapeutic activities;Therapeutic exercise;Balance training;Neuromuscular re-education;Patient/family education   PT Goals (Current goals can be found in the Care Plan section) Acute Rehab PT Goals Patient Stated Goal: Go home PT Goal Formulation: With patient Time For Goal Achievement: 01/25/14 Potential to Achieve Goals: Good    Frequency Min 3X/week   Barriers to discharge Decreased caregiver support lives alone    Co-evaluation               End of Session   Activity Tolerance: Treatment limited secondary to medical complications (Comment) (drop in BP) Patient left: in chair;with call bell/phone within reach;with family/visitor present Nurse Communication: Mobility status;Other (comment);Precautions (BP dropped upon standing.)         Time: 1416-1441 PT Time Calculation (min) (ACUTE ONLY): 25 min   Charges:   PT Evaluation $Initial PT Evaluation Tier I: 1 Procedure PT Treatments $Therapeutic Activity: 8-22 mins   PT G Codes:        Ellouise Newer 01/11/2014, 3:06 PM Camille Bal [place], [place]

## 2014-01-11 NOTE — ED Notes (Signed)
Dr.Zackowski at bedside; no sutures to be placed.

## 2014-01-11 NOTE — ED Provider Notes (Signed)
CSN: [id number]     Arrival date & time 01/11/14  0539 History   First MD Initiated Contact with Patient 01/11/14 [phone]     Chief Complaint  Patient presents with  . Loss of Consciousness     (Consider location/radiation/quality/duration/timing/severity/associated sxs/prior Treatment) Patient is a 74 y.o. female presenting with syncope. The history is provided by the patient.  Loss of Consciousness Associated symptoms: no chest pain, no confusion, no fever, no headaches and no shortness of breath    sudden loss of consciousness while in the shower fell backwards striking the back of her head resulting in laceration. Patient had no symptoms prior. Patient brought in by EMS. Head was wrapped. Patient remained asymptomatic here no complain of any leg pain hip pain back pain neck pain.  Past Medical History  Diagnosis Date  . Thrombocytopenia 11/19/2010  . Hypertension   . 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 Arthritis   . Heart murmur   . Pancreatitis     h/o  . Carotid artery occlusion    Past Surgical History  Procedure Laterality Date  . Cesarean section      FIVE  . Cholecystectomy    . Appendectomy    . Abdominal hysterectomy    . Tonsillectomy    . Endarterectomy  06/09/2011    Procedure: ENDARTERECTOMY CAROTID;  Surgeon: Rosetta Posner, MD;  Location: [facility] OR;  Service: Vascular;  Laterality: Left;  left carotid endarterectomy with patch angioplasty  . Carotid endarterectomy  06/09/11    LEFT  cea   Family History  Problem Relation Age of Onset  . Heart disease Mother   . Hypertension Mother   . Heart attack Mother   . Heart disease Father   . Hypertension Father   . Hypertension Sister   . Hypertension Son    History  Substance Use Topics  . Smoking status: Never Smoker   . Smokeless tobacco: Never Used  . Alcohol Use: No   OB History    No data available     Review of Systems  Constitutional: Negative for fever.  HENT: Negative for congestion.    Eyes: Negative for visual disturbance.  Respiratory: Negative for shortness of breath.   Cardiovascular: Positive for syncope. Negative for chest pain.  Gastrointestinal: Negative for abdominal pain.  Genitourinary: Negative for dysuria.  Musculoskeletal: Negative for back pain and neck pain.  Skin: Positive for wound.  Neurological: Negative for headaches.  Hematological: Does not bruise/bleed easily.  Psychiatric/Behavioral: Negative for confusion.      Allergies  Review of patient's allergies indicates no known allergies.  Home Medications   Prior to Admission medications   Medication Sig Start Date End Date Taking? Authorizing Provider  acetaminophen (TYLENOL) 500 MG tablet Take 1,000 mg by mouth every 6 (six) hours as needed. pain    Yes Historical Provider, MD  aspirin EC 81 MG tablet Take 81 mg by mouth daily.     Yes Historical Provider, MD  Calcium-Vitamin D 500-125 MG-UNIT TABS Take 1 tablet by mouth daily.    Yes Historical Provider, MD  lisinopril (PRINIVIL,ZESTRIL) 20 MG tablet Take 20 mg by mouth daily.     Yes Historical Provider, MD  metoprolol succinate (TOPROL-XL) 50 MG 24 hr tablet Take 50 mg by mouth daily. Take 1 1/2 tab.Take with or immediately following a meal.   Yes Historical Provider, MD  Multiple Vitamins-Minerals (MULTIVITAMINS THER. W/MINERALS) TABS Take 1 tablet by mouth daily.  Yes Historical Provider, MD   BP 127/48 mmHg  Pulse 97  Temp(Src) 97.5 F (36.4 C) (Oral)  Resp 14  Ht 5' (1.524 m)  Wt 98 lb (44.453 kg)  BMI 19.14 kg/m2  SpO2 100% Physical Exam  Constitutional: She is oriented to person, place, and time. She appears well-developed and well-nourished. No distress.  HENT:  Mouth/Throat: Oropharynx is clear and moist.  Occipital parietal scalp laceration. Once identified is about a 1 cm in size no active bleeding now surrounded by an area of swelling measuring about 3-4 cm. No suturing or stapling required.  Eyes: Conjunctivae and  EOM are normal. Pupils are equal, round, and reactive to light.  Neck: Normal range of motion.  Cardiovascular: Normal rate.   Murmur heard. Right upper sternal murmur.  Pulmonary/Chest: Effort normal and breath sounds normal. No respiratory distress.  Abdominal: Soft. Bowel sounds are normal. There is no tenderness.  Musculoskeletal: Normal range of motion. She exhibits no edema.  Neurological: She is alert and oriented to person, place, and time. No cranial nerve deficit. She exhibits normal muscle tone. Coordination normal.  Skin: Skin is warm.  Nursing note and vitals reviewed.   ED Course  Procedures (including critical care time) Labs Review Labs Reviewed  CBC WITH DIFFERENTIAL - Abnormal; Notable for the following:    WBC 14.8 (*)    RBC 3.46 (*)    Hemoglobin 11.0 (*)    HCT 32.7 (*)    Platelets 44 (*)    Neutrophils Relative % 17 (*)    Lymphocytes Relative 75 (*)    Lymphs Abs 11.2 (*)    All other components within normal limits  BASIC METABOLIC PANEL - Abnormal; Notable for the following:    Glucose, Bld 112 (*)    Creatinine, Ser 1.23 (*)    GFR calc non Af Amer 42 (*)    GFR calc Af Amer 49 (*)    All other components within normal limits  URINALYSIS, ROUTINE W REFLEX MICROSCOPIC - Abnormal; Notable for the following:    APPearance HAZY (*)    Hgb urine dipstick TRACE (*)    Nitrite POSITIVE (*)    Leukocytes, UA SMALL (*)    All other components within normal limits  URINE MICROSCOPIC-ADD ON - Abnormal; Notable for the following:    Squamous Epithelial / LPF FEW (*)    Bacteria, UA MANY (*)    All other components within normal limits  PATHOLOGIST SMEAR REVIEW   Results for orders placed or performed during the hospital encounter of 01/11/14  CBC with Differential  Result Value Ref Range   WBC 14.8 (H) 4.0 - 10.5 K/uL   RBC 3.46 (L) 3.87 - 5.11 MIL/uL   Hemoglobin 11.0 (L) 12.0 - 15.0 g/dL   HCT 32.7 (L) 36.0 - 46.0 %   MCV 94.5 78.0 - 100.0 fL    MCH 31.8 26.0 - 34.0 pg   MCHC 33.6 30.0 - 36.0 g/dL   RDW 14.3 11.5 - 15.5 %   Platelets 44 (L) 150 - 400 K/uL   Neutrophils Relative % 17 (L) 43 - 77 %   Lymphocytes Relative 75 (H) 12 - 46 %   Monocytes Relative 7 3 - 12 %   Eosinophils Relative 1 0 - 5 %   Basophils Relative 0 0 - 1 %   Neutro Abs 2.5 1.7 - 7.7 K/uL   Lymphs Abs 11.2 (H) 0.7 - 4.0 K/uL   Monocytes Absolute 1.0 0.1 -  1.0 K/uL   Eosinophils Absolute 0.1 0.0 - 0.7 K/uL   Basophils Absolute 0.0 0.0 - 0.1 K/uL   WBC Morphology ATYPICAL LYMPHOCYTES    Smear Review LARGE PLATELETS PRESENT   Basic metabolic panel  Result Value Ref Range   Sodium 140 135 - 145 mmol/L   Potassium 4.8 3.5 - 5.1 mmol/L   Chloride 106 96 - 112 mEq/L   CO2 26 19 - 32 mmol/L   Glucose, Bld 112 (H) 70 - 99 mg/dL   BUN 21 6 - 23 mg/dL   Creatinine, Ser 1.23 (H) 0.50 - 1.10 mg/dL   Calcium 10.1 8.4 - 10.5 mg/dL   GFR calc non Af Amer 42 (L) >90 mL/min   GFR calc Af Amer 49 (L) >90 mL/min   Anion gap 8 5 - 15  Urinalysis, Routine w reflex microscopic  Result Value Ref Range   Color, Urine YELLOW YELLOW   APPearance HAZY (A) CLEAR   Specific Gravity, Urine 1.012 1.005 - 1.030   pH 7.0 5.0 - 8.0   Glucose, UA NEGATIVE NEGATIVE mg/dL   Hgb urine dipstick TRACE (A) NEGATIVE   Bilirubin Urine NEGATIVE NEGATIVE   Ketones, ur NEGATIVE NEGATIVE mg/dL   Protein, ur NEGATIVE NEGATIVE mg/dL   Urobilinogen, UA 0.2 0.0 - 1.0 mg/dL   Nitrite POSITIVE (A) NEGATIVE   Leukocytes, UA SMALL (A) NEGATIVE  Urine microscopic-add on  Result Value Ref Range   Squamous Epithelial / LPF FEW (A) RARE   WBC, UA 7-10 <3 WBC/hpf   RBC / HPF 0-2 <3 RBC/hpf   Bacteria, UA MANY (A) RARE     Imaging Review Ct Head Wo Contrast  01/11/2014   CLINICAL DATA:  Fall in shower this morning with dizziness  EXAM: CT HEAD WITHOUT CONTRAST  CT CERVICAL SPINE WITHOUT CONTRAST  TECHNIQUE: Multidetector CT imaging of the head and cervical spine was performed following the  standard protocol without intravenous contrast. Multiplanar CT image reconstructions of the cervical spine were also generated.  COMPARISON:  None  FINDINGS: CT HEAD FINDINGS  The bony calvarium is intact. A scalp hematoma and soft tissue laceration are noted in the right posterior parietal region. Mild atrophic changes are noted. Mild chronic white matter ischemic change is seen. No findings to suggest acute hemorrhage, acute infarction or space-occupying mass lesion are noted.  CT CERVICAL SPINE FINDINGS  Seven cervical segments are well visualized. Vertebral body height is well maintained. Multilevel osteophytic changes are noted as well as facet hypertrophic changes. The hypertrophic facet changes are predominately noted on the left. Calcifications of the carotid and vertebral arteries are noted. No acute fracture or acute facet abnormality is seen.  IMPRESSION: CT of the head: Soft tissue injury in the right parietal region without acute intracranial abnormality.  CT of the cervical spine: Multilevel degenerative changes without acute abnormality. The facet hypertrophic changes are predominantly on the left.   Electronically Signed   By: Inez Catalina M.D.   On: 01/11/2014 10:08   Ct Cervical Spine Wo Contrast  01/11/2014   CLINICAL DATA:  Fall in shower this morning with dizziness  EXAM: CT HEAD WITHOUT CONTRAST  CT CERVICAL SPINE WITHOUT CONTRAST  TECHNIQUE: Multidetector CT imaging of the head and cervical spine was performed following the standard protocol without intravenous contrast. Multiplanar CT image reconstructions of the cervical spine were also generated.  COMPARISON:  None  FINDINGS: CT HEAD FINDINGS  The bony calvarium is intact. A scalp hematoma and soft tissue laceration  are noted in the right posterior parietal region. Mild atrophic changes are noted. Mild chronic white matter ischemic change is seen. No findings to suggest acute hemorrhage, acute infarction or space-occupying mass lesion are  noted.  CT CERVICAL SPINE FINDINGS  Seven cervical segments are well visualized. Vertebral body height is well maintained. Multilevel osteophytic changes are noted as well as facet hypertrophic changes. The hypertrophic facet changes are predominately noted on the left. Calcifications of the carotid and vertebral arteries are noted. No acute fracture or acute facet abnormality is seen.  IMPRESSION: CT of the head: Soft tissue injury in the right parietal region without acute intracranial abnormality.  CT of the cervical spine: Multilevel degenerative changes without acute abnormality. The facet hypertrophic changes are predominantly on the left.   Electronically Signed   By: Inez Catalina M.D.   On: 01/11/2014 10:08   Dg Chest Port 1 View  01/11/2014   CLINICAL DATA:  Syncopal episode.  Fall.  EXAM: PORTABLE CHEST - 1 VIEW  COMPARISON:  03/21/2011.  FINDINGS: Cardiopericardial silhouette within normal limits. Stable prominent costochondral calcification at the RIGHT first rib end. Monitoring leads project over the chest. No airspace disease. No pleural effusion. No displaced rib fracture or pneumothorax in this patient with a fall. Aortic arch atherosclerosis.  IMPRESSION: No active cardiopulmonary disease and no interval change.   Electronically Signed   By: Dereck Ligas M.D.   On: 01/11/2014 09:05     EKG Interpretation   Date/Time:  Wednesday January 11 2014 08:21:12 EST Ventricular Rate:  73 PR Interval:  185 QRS Duration: 85 QT Interval:  349 QTC Calculation: 384 R Axis:   56 Text Interpretation:  Unknown rhythm, irregular rate Anteroseptal infarct,  old Borderline ST depression, diffuse leads Confirmed by Alizah Sills  MD,  Jobe Mutch [phone]) on 01/11/2014 8:25:48 AM      LACERATION REPAIR Performed by: Fredia Sorrow Authorized by: Fredia Sorrow Consent: Verbal consent obtained. Risks and benefits: risks, benefits and alternatives were discussed Consent given by: patient Patient identity  confirmed: provided demographic data Prepped and Draped in normal sterile fashion Wound explored   Actually patients scalp laceration is only 1 cm in size despite all the bleeding. One blood cleared away no further bleeding no suturing or stapling required.   Patient tolerance: Patient tolerated the procedure well with no immediate complications.     MDM   Final diagnoses:  Cardiac murmur  Syncope, unspecified syncope type    Patient will explain syncopal episode while in the shower. They came on suddenly. Patient will require admission for cardiac monitoring. Patient's primary care doctor is a Dr. Ron[facility]d [facility] at [facility]. Should be unassigned admission. Patient followed by cardiology in the past. But has not seen anybody recently. Her last cardiologist was Dr. Rollene Fare. 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 She does have a murmur in the right upper sternal area of her chest which would be consistent with that. This could've contributed to her syncopal episode.  Patient's head CT negative. Patient with scalp laceration parietal area. This will be closed by staples.  Patient will be an unassigned admission and cardiology will be consult.  In addition urine culture sent due to urinalysis being suspicious for possible urinary tract infection. Admitting team aware of this.    Fredia Sorrow, MD 01/11/14 [phone]

## 2014-01-11 NOTE — Consult Note (Signed)
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 The symptoms were acute, without any obvious symptoms leading to the event. She denied any sick contacts, fevers, chills or night sweats. She denied any headaches or vision changes, seizures or confusion. She denied any respiratory symptoms, She denies any chest pain or palpitations. She denied any nausea or vomiting. She denied abdominal pain, but did report frequent urination. She denied any bleeding issues. She takes baby aspirin daily. CT of the head was negative for acute intracranial abnormalities or hemorrhage, but positive for soft tissue injury requiring repair. EKG demonstrated dysrrythmia which is being evaluated by cardiology. Urinalysis was positive for nitrites suspicious for UTI, with cultures pending; no antibiotics have been initiated to date. CBC showed platelets at 44,000, WBC at 14.8  Oncology was asked to see patient in consultation regarding hematological abnormalities. She is not on anticoagulation at this time due to low platelet count..  Brief Oncological History  1. Chronic lymphocytic leukemia She underwent a CT scan of the neck carried out with IV contrast on 11/05/2010. That report reads as follows: Cervical lymph nodes were increased in number throughout the neck.There was an abnormally enlarged left level IIIB node measuring 11 x 18 x 22 mm. An abnormal left level IB node is indenting the lateral aspect of the left submandibular  gland measuring 11 x 15 x 14 mm. There was also left supraclavicular adenopathy at level IV with individual nodes measuring up to 16 mm in short axis (16 x 18 x 32 mm overall). Right level IV nodes were increased in size and number measuring up to 7 mm in short axis. The right level III nodes were increased in size and number measuring up to 9 mm in short axis. The lymph nodes seen in the superior mediastinum were felt to be within normal limits. There was left carotid calcified atherosclerosis with suspected hemodynamically significant stenosis. The patient was referred for needle biopsy carried out under ultrasound guidance on 11/11/2010.There was a large left supraclavicular lymph node identified measuring 2.9 cm. Apparently this lymph node was biopsied. A fine-needle aspirate of this left supraclavicular lymph node showed monomorphic lymphocytic population. B-cell antigens were expressed. It was felt that the findings were most consistent with  Chronic lymphocytic leukemia/ small lymphocytic lymphoma, favoring CLL.CD 20 was positive.CD 23 was positive.CD 19 was positive.Cyclin D1 immunoperoxidase stain was negative.There was low mitotic rate and no necrosis. No CD 10 expression was identified. The patient elected for observation only. Since his last oncologist retired, she has not made any follow-up return appointment to the cancer center.  2. 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 Hypogammaglobulinemia, on observation   Last Quantitative Immunoglobulins were in 09/16/2006, IgG at 556, IgM at 18 and IgA at 44   Past  Medical History  Diagnosis Date  . Thrombocytopenia 11/19/2010  . Hypertension   . 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 Arthritis   . Heart murmur   . Pancreatitis     h/o  . Carotid artery occlusion    Past Surgical History  Procedure Laterality Date   . Cesarean section      FIVE  . Cholecystectomy  2002  . Appendectomy    . Abdominal hysterectomy with SOO (fibroids)    . Tonsillectomy    . Endarterectomy  06/09/2011    Procedure: ENDARTERECTOMY CAROTID;  Surgeon: Rosetta Posner, MD;  Location: [facility] OR;  Service: Vascular;  Laterality: Left;  left carotid endarterectomy with patch angioplasty  . Carotid endarterectomy  06/09/11    LEFT  cea    MEDICATIONS: Scheduled Meds: . metoprolol tartrate  25 mg Oral BID  . sodium chloride  3 mL Intravenous Q12H   Continuous Infusions: . sodium chloride     PRN Meds:.acetaminophen **OR** acetaminophen, acetaminophen, alum & mag hydroxide-simeth, ondansetron **OR** ondansetron (ZOFRAN) IV, oxyCODONE, polyethylene glycol   Review of Systems: Constitutional: Denies fevers, chills or abnormal night sweats. She has occasional hot flashes Eyes: Denies blurriness of vision, double vision or watery eyes Ears, nose, mouth, throat, and face: Denies mucositis or sore throat Respiratory: Denies cough, dyspnea or wheezes Cardiovascular: Denies palpitation, chest discomfort or lower extremity swelling. She reported a syncopal episode for which she was admitted, diagnosed with atrial fibrillation. Gastrointestinal:  Denies nausea at this time, heartburn or change in bowel habits Skin: Denies abnormal skin rashes. She has a head trauma to the soft tissue due to mechanical fall Lymphatics: Denies new lymphadenopathy. She admits to easy bruising Neurological: Denies numbness, tingling or new weaknesses.  Behavioral/Psych: Mood is stable, no new changes  All other systems were reviewed with the patient and are negative.  Family History  Problem Relation Age of Onset  . Heart disease Mother 34  . Hypertension Mother   . Heart attack Mother   . Heart disease Father 58  . Hypertension Father   . Hypertension Sister   . Hypertension Son       Pancreatic Cancer                                              Son                                                          42 No history of blood disorders  History   Social History  . Marital Status: Widowed since 1997    Spouse Name: [name]    Number of Children: 4 (C sections)  . Years of Education: [name]   Occupational History  . Not on file.   Social History Main Topics  . Smoking status: Never Smoker   . Smokeless tobacco: Never Used  . Alcohol Use: None in 15 years  . Drug Use: No  . Sexual Activity: Not on file   Other Topics Concern  . Originally from [place], [place], in [place] the last 16 years   Social History Narrative    ALLERGIES:  No Known Allergies  PHYSICAL EXAMINATION:  Filed Vitals:   01/11/14 1315  BP: 123/32  Pulse:   Temp:   Resp: 18   Filed Weights   01/11/14 0839  Weight: 98 lb (44.453 kg)    GENERAL:alert, no distress and comfortable SKIN: skin color, texture, turgor are normal, no rashes or significant lesions. Left leg bruising and right parietal soft tissue injury due to fall. Head wrapped due to laceration in the right parietal area EYES: normal, conjunctiva are pink and non-injected, sclera clear OROPHARYNX:no exudate, no erythema and lips, buccal mucosa, and tongue normal  NECK: supple, thyroid normal size, non-tender, without nodularity LYMPH:  Palpable lymphadenopathy in the neck. None elsewhere.  LUNGS: clear to auscultation and percussion with normal breathing effort HEART: regular rate & rhythm with loud systolic murmurs and no lower extremity edema.  ABDOMEN:abdomen soft, non-tender and normal bowel sounds. No splenomegaly Musculoskeletal:no cyanosis of digits and no clubbing PSYCH: alert & oriented x 3 with fluent speech NEURO: no focal motor/sensory deficits   LABORATORY/RADIOLOGY DATA:   Recent Labs Lab 01/11/14 0826  WBC 14.8*  HGB 11.0*  HCT 32.7*  PLT 44*  MCV 94.5  MCH 31.8  MCHC 33.6  RDW 14.3  LYMPHSABS 11.2*  MONOABS 1.0  EOSABS 0.1  BASOSABS 0.0     CMP    Recent Labs Lab 01/11/14 0826  NA 140  K 4.8  CL 106  CO2 26  GLUCOSE 112*  BUN 21  CREATININE 1.23*  CALCIUM 10.1        Component Value Date/Time   BILITOT 0.59 05/20/2012 0823   BILITOT 0.6 06/06/2011 0826    Urinalysis    Component Value Date/Time   COLORURINE YELLOW 01/11/2014 0957   APPEARANCEUR HAZY* 01/11/2014 0957   LABSPEC 1.012 01/11/2014 0957   PHURINE 7.0 01/11/2014 0957   GLUCOSEU NEGATIVE 01/11/2014 0957   HGBUR TRACE* 01/11/2014 0957   BILIRUBINUR NEGATIVE 01/11/2014 0957   KETONESUR NEGATIVE 01/11/2014 0957   PROTEINUR NEGATIVE 01/11/2014 0957   UROBILINOGEN 0.2 01/11/2014 0957   NITRITE POSITIVE* 01/11/2014 0957   LEUKOCYTESUR SMALL* 01/11/2014 0957   Radiology Studies:  Ct Head Wo Contrast  01/11/2014   CLINICAL DATA:  Fall in shower this morning with dizziness  EXAM: CT HEAD WITHOUT CONTRAST  CT CERVICAL SPINE WITHOUT CONTRAST  TECHNIQUE: Multidetector CT imaging of the head and cervical spine was performed following the standard protocol without intravenous contrast. Multiplanar CT image reconstructions of the cervical spine were also generated.  COMPARISON:  None  FINDINGS: CT HEAD FINDINGS  The bony calvarium is intact. A scalp hematoma and soft tissue laceration are noted in the right posterior parietal region. Mild atrophic changes are noted. Mild chronic white matter ischemic change is seen. No findings to suggest acute hemorrhage, acute infarction or space-occupying mass lesion are noted.  CT CERVICAL SPINE FINDINGS  Seven cervical segments are well visualized. Vertebral body height is well maintained. Multilevel osteophytic changes are noted as well as facet hypertrophic changes. The hypertrophic facet changes are predominately noted on the left. Calcifications of the carotid and vertebral arteries are noted. No acute fracture or acute facet abnormality is seen.  IMPRESSION: CT of the head: Soft tissue injury in the right parietal  region without acute intracranial abnormality.  CT of the cervical spine: Multilevel degenerative changes without acute abnormality. The facet hypertrophic changes are predominantly on the left.   Electronically Signed   By: Inez Catalina M.D.   On: 01/11/2014 10:08   Ct Cervical Spine  Wo Contrast  01/11/2014   CLINICAL DATA:  Fall in shower this morning with dizziness  EXAM: CT HEAD WITHOUT CONTRAST  CT CERVICAL SPINE WITHOUT CONTRAST  TECHNIQUE: Multidetector CT imaging of the head and cervical spine was performed following the standard protocol without intravenous contrast. Multiplanar CT image reconstructions of the cervical spine were also generated.  COMPARISON:  None  FINDINGS: CT HEAD FINDINGS  The bony calvarium is intact. A scalp hematoma and soft tissue laceration are noted in the right posterior parietal region. Mild atrophic changes are noted. Mild chronic white matter ischemic change is seen. No findings to suggest acute hemorrhage, acute infarction or space-occupying mass lesion are noted.  CT CERVICAL SPINE FINDINGS  Seven cervical segments are well visualized. Vertebral body height is well maintained. Multilevel osteophytic changes are noted as well as facet hypertrophic changes. The hypertrophic facet changes are predominately noted on the left. Calcifications of the carotid and vertebral arteries are noted. No acute fracture or acute facet abnormality is seen.  IMPRESSION: CT of the head: Soft tissue injury in the right parietal region without acute intracranial abnormality.  CT of the cervical spine: Multilevel degenerative changes without acute abnormality. The facet hypertrophic changes are predominantly on the left.   Electronically Signed   By: Inez Catalina M.D.   On: 01/11/2014 10:08   Dg Chest Port 1 View  01/11/2014   CLINICAL DATA:  Syncopal episode.  Fall.  EXAM: PORTABLE CHEST - 1 VIEW  COMPARISON:  03/21/2011.  FINDINGS: Cardiopericardial silhouette within normal limits. Stable  prominent costochondral calcification at the RIGHT first rib end. Monitoring leads project over the chest. No airspace disease. No pleural effusion. No displaced rib fracture or pneumothorax in this patient with a fall. Aortic arch atherosclerosis.  IMPRESSION: No active cardiopulmonary disease and no interval change.   Electronically Signed   By: Dereck Ligas M.D.   On: 01/11/2014 09:05   Echocardiogram show critical aortic valve stenosis ASSESSMENT AND PLAN:  Chronic Lymphcoytic Leukemia (CLL)  On observation only  Admission WBC was 14,800 This is not very high. Clinically, she is at RAI stage IV. She does not need urgent treatment for this.  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 I will order additional workup including liver function tests, serum vitamin B-12 and fibrinogen level. I will start her on vitamin G-40 and folic acid. I will start her on empiric treatment with prednisone starting tomorrow for possible ITP which is a low risk treatment. I discussed with the patient the rationale of this and she agreed to proceed.  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  On observation only  Syncopal Episode In the setting of irregular heart beat, orthostatic hypotension and  Severe aortic stenosis Appreciate cardiology follow up  DVT prophylaxis On mechanical devices  Renal insufficiency Chronic, baseline 1.2 Expected to improve with IV hydration  UTI Urinalysis positive for nitrite, cultures pending Plans for antibiotics as per admitting team.  Full Code    **Disclaimer: This note was dictated  with voice recognition software. Similar sounding words can inadvertently be transcribed and this note may contain transcription errors which may not have been corrected upon publication of note.Marland Kitchen  [facility]  E, PA-C 01/11/2014, 2:09 PM Nicole Harnish, MD 01/11/2014

## 2014-01-11 NOTE — Consult Note (Signed)
CARDIOLOGY CONSULT NOTE   Patient ID: Nicole Mercado MRN: [id number], DOB/AGE: Feb 21, 1940   Admit date: 01/11/2014 Date of Consult: 01/11/2014   Primary Physician: Andria Frames, MD Primary Cardiologist: previous Dr. Albesa Seen  Pt. Profile  74 year old Caucasian female with past medical history of chronic lymphocytic leukemia, chronic thrombocytopenia, hypertension, history of left carotid stenosis status post left endarterectomy in June 2013, and history of severe AS present with syncope  Problem List  Past Medical History  Diagnosis Date  . Thrombocytopenia 11/19/2010  . Hypertension   . 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 Arthritis   . Heart murmur   . Pancreatitis     h/o  . Carotid artery occlusion     Past Surgical History  Procedure Laterality Date  . Cesarean section      FIVE  . Cholecystectomy    . Appendectomy    . Abdominal hysterectomy    . Tonsillectomy    . Endarterectomy  06/09/2011    Procedure: ENDARTERECTOMY CAROTID;  Surgeon: Rosetta Posner, MD;  Location: [facility] OR;  Service: Vascular;  Laterality: Left;  left carotid endarterectomy with patch angioplasty  . Carotid endarterectomy  06/09/11    LEFT  cea     Allergies  No Known Allergies  HPI   The patient is a pleasant 74 year old Caucasian female with past medical history of chronic lymphocytic leukemia, chronic thrombocytopenia, hypertension, history of left carotid stenosis status post left endarterectomy in June 2013, and history of severe AS. She was previously followed by Dr. Rollene Fare. Echocardiogram obtained in November 2013 showed a trileaflet aortic valve with severe calcification and severe aortic stenosis, EF was greater than 55%. She had a repeat echocardiogram in May 2014 which showed EF 53-97%, grade 1 diastolic dysfunction, aortic valve area 0.49, mild MR, PA peak pressure 41 mmHg. According to Dr. Rollene Fare, compared to the last study in 2013, aortic valve velocity has increased  slightly and RV systolic pressure was consistent with mild pulmonary hypertension. She was subsequently referred to Dr. Servando Snare for consideration of surgery. However, after seeing Dr. Servando Snare, patient wish to consult with another CT surgeon before making her decision. She also wished to have the surgery in [place] where her daughter-in-law works as a Marine scientist as part of the [place] surgery department. She was lost to follow-up after Dr. Rollene Fare retired.   According to the patient, she has been very active at home and live independently. She is able to does her own cooking, cleaning, and shopping without any difficulty. She denies prior history of chest pain with or without exertion. She denies prior history of dizziness, headache, presyncope or syncope before October 2015. She denies any recent lower extremity edema, orthopnea, paroxysmal nocturnal dyspnea, fever, chill or cough. In October, she had episode of dizziness after receiving flu shot, and that was the last time she had any type of dizziness. Patient was in the shower in the morning of 01/11/2014 when she had sudden onset of dizziness and passed out. She states she must have passed out for no more than a minute. She woke up on the floor and called her friend who contacted the EMS.   On arrival to [facility], her blood pressure was 152/56. O2 saturation 100% on room air. EKG showed atrial ectopy with frequent PACs. Laboratory finding include sodium 140, potassium 4.8, creatinine 1.23, white blood cell count 14.8, hemoglobin 11.0, platelet 44. Chest x-ray showed no acute disease. CT of the head showed  soft tissue injuries in the right periatal region without acute intracranial abnormality, multilevel degenerative changes in the spine without acute abnormality. Urinalysis shows many bacteria and positive nitrite. Urine culture is currently pending. Hematology has been consulted for thrombocytopenia and history of chronic lymphocytic leukemia.  Cardiology has been consulted for syncope and severe aortic stenosis.  Of note, vital signs shows patient is orthostatic: Lying BP 130/50, pulse 85. Sitting BP 115/38, pulse 89. Standing BP 85/38, pulse 110.  Inpatient Medications  . metoprolol tartrate  25 mg Oral BID  . sodium chloride  3 mL Intravenous Q12H    Family History Family History  Problem Relation Age of Onset  . Heart disease Mother   . Hypertension Mother   . Heart attack Mother   . Heart disease Father   . Hypertension Father   . Hypertension Sister   . Hypertension Son      Social History History   Social History  . Marital Status: Widowed    Spouse Name: [name]    Number of Children: [name]  . Years of Education: [name]   Occupational History  . Not on file.   Social History Main Topics  . Smoking status: Never Smoker   . Smokeless tobacco: Never Used  . Alcohol Use: No  . Drug Use: No  . Sexual Activity: Not on file   Other Topics Concern  . Not on file   Social History Narrative     Review of Systems  General:  No chills, fever, night sweats or weight changes.  Cardiovascular:  No chest pain, dyspnea on exertion, edema, orthopnea, palpitations, paroxysmal nocturnal dyspnea. Dermatological: No rash, lesions/masses Respiratory: No cough, dyspnea Urologic: No hematuria, dysuria Abdominal:   No nausea, vomiting, diarrhea, bright red blood per rectum, melena, or hematemesis Neurologic:  No visual changes, wkns + dizziness and transient changes in mental status. All other systems reviewed and are otherwise negative except as noted above.  Physical Exam  Blood pressure 130/50, pulse 100, temperature 97.5 F (36.4 C), temperature source Oral, resp. rate 18, height 5' (1.524 m), weight 98 lb (44.453 kg), SpO2 100 %.  General: Pleasant, NAD Psych: Normal affect. Neuro: Alert and oriented X 3. Moves all extremities spontaneously. HEENT: Dressing noted with blood over posterior R parietal region  Neck:  Supple without bruits or JVD. Lungs:  Resp regular and unlabored, CTA. Heart: RRR no s3, s4. 3/6 systolic murmur Abdomen: Soft, non-tender, non-distended, BS + x 4.  Extremities: No clubbing, cyanosis or edema. DP/PT/Radials 2+ and equal bilaterally.  Labs  No results for input(s): CKTOTAL, CKMB, TROPONINI in the last 72 hours. Lab Results  Component Value Date   WBC 14.8* 01/11/2014   HGB 11.0* 01/11/2014   HCT 32.7* 01/11/2014   MCV 94.5 01/11/2014   PLT 44* 01/11/2014    Recent Labs Lab 01/11/14 0826  NA 140  K 4.8  CL 106  CO2 26  BUN 21  CREATININE 1.23*  CALCIUM 10.1  GLUCOSE 112*   No results found for: CHOL, HDL, LDLCALC, TRIG No results found for: DDIMER  Radiology/Studies  Ct Head Wo Contrast  01/11/2014   CLINICAL DATA:  Fall in shower this morning with dizziness  EXAM: CT HEAD WITHOUT CONTRAST  CT CERVICAL SPINE WITHOUT CONTRAST  TECHNIQUE: Multidetector CT imaging of the head and cervical spine was performed following the standard protocol without intravenous contrast. Multiplanar CT image reconstructions of the cervical spine were also generated.  COMPARISON:  None  FINDINGS: CT HEAD  FINDINGS  The bony calvarium is intact. A scalp hematoma and soft tissue laceration are noted in the right posterior parietal region. Mild atrophic changes are noted. Mild chronic white matter ischemic change is seen. No findings to suggest acute hemorrhage, acute infarction or space-occupying mass lesion are noted.  CT CERVICAL SPINE FINDINGS  Seven cervical segments are well visualized. Vertebral body height is well maintained. Multilevel osteophytic changes are noted as well as facet hypertrophic changes. The hypertrophic facet changes are predominately noted on the left. Calcifications of the carotid and vertebral arteries are noted. No acute fracture or acute facet abnormality is seen.  IMPRESSION: CT of the head: Soft tissue injury in the right parietal region without acute  intracranial abnormality.  CT of the cervical spine: Multilevel degenerative changes without acute abnormality. The facet hypertrophic changes are predominantly on the left.   Electronically Signed   By: Inez Catalina M.D.   On: 01/11/2014 10:08   Ct Cervical Spine Wo Contrast  01/11/2014   CLINICAL DATA:  Fall in shower this morning with dizziness  EXAM: CT HEAD WITHOUT CONTRAST  CT CERVICAL SPINE WITHOUT CONTRAST  TECHNIQUE: Multidetector CT imaging of the head and cervical spine was performed following the standard protocol without intravenous contrast. Multiplanar CT image reconstructions of the cervical spine were also generated.  COMPARISON:  None  FINDINGS: CT HEAD FINDINGS  The bony calvarium is intact. A scalp hematoma and soft tissue laceration are noted in the right posterior parietal region. Mild atrophic changes are noted. Mild chronic white matter ischemic change is seen. No findings to suggest acute hemorrhage, acute infarction or space-occupying mass lesion are noted.  CT CERVICAL SPINE FINDINGS  Seven cervical segments are well visualized. Vertebral body height is well maintained. Multilevel osteophytic changes are noted as well as facet hypertrophic changes. The hypertrophic facet changes are predominately noted on the left. Calcifications of the carotid and vertebral arteries are noted. No acute fracture or acute facet abnormality is seen.  IMPRESSION: CT of the head: Soft tissue injury in the right parietal region without acute intracranial abnormality.  CT of the cervical spine: Multilevel degenerative changes without acute abnormality. The facet hypertrophic changes are predominantly on the left.   Electronically Signed   By: Inez Catalina M.D.   On: 01/11/2014 10:08   Dg Chest Port 1 View  01/11/2014   CLINICAL DATA:  Syncopal episode.  Fall.  EXAM: PORTABLE CHEST - 1 VIEW  COMPARISON:  03/21/2011.  FINDINGS: Cardiopericardial silhouette within normal limits. Stable prominent costochondral  calcification at the RIGHT first rib end. Monitoring leads project over the chest. No airspace disease. No pleural effusion. No displaced rib fracture or pneumothorax in this patient with a fall. Aortic arch atherosclerosis.  IMPRESSION: No active cardiopulmonary disease and no interval change.   Electronically Signed   By: Dereck Ligas M.D.   On: 01/11/2014 09:05    ECG  Sinus rhythm with frequent PACs  ASSESSMENT AND PLAN  1. Syncope 2/2 combination of orthostatic hypotension and severe AS  - R parietal head wound, no intracranial abnormality noted on CT of head and neck  - continue metoprolol, d/c lisinopril given orthostasis   2. Severe AS  - previous followup by Dr. Rollene Fare, however lost to followup after Dr. Rollene Fare retired  - will discuss with Dr. Burt Knack regarding possible option, thrombocytopenia and her age does pose a problem to open surgery, but maybe candidate for TAVR  - pending repeat echocardiogram  3. Orthostatic hypotension  -  Lying BP 130/50, pulse 85. Sitting BP 115/38, pulse 89. Standing BP 85/38, pulse 110.  4. UTI: per primary  5. Thrombocytopenia   6. H/o CLL: hematology on board  7. H/o L carotid stenosis s/p L endarterectomy  - U/S 08/18/2013 shows <40% stenosis bilaterally  8. HTN   Signed, Almyra Deforest, [place] 01/11/2014, 3:52 PM  Patient seen, examined. Available data reviewed. Agree with findings, assessment, and plan as outlined by Almyra Deforest, PA-C. Exam reveals an alert, oriented, elderly woman in no distress. Her head is wrapped in a dressing because of a scalp wound. Carotids are 2+ bilaterally. Heart is regular rate and rhythm with a grade 3/6 harsh late peaking systolic murmur with diminished A2. Lungs are clear bilaterally. Abdomen is soft and nontender. There is no peripheral edema.  2D Echo reviewed with result below: Study Conclusions  - Left ventricle: The cavity size was normal. There was mild concentric hypertrophy. Systolic  function was normal. The estimated ejection fraction was in the range of 60% to 65%. Wall motion was normal; there were no regional wall motion abnormalities. Doppler parameters are consistent with abnormal left ventricular relaxation (grade 1 diastolic dysfunction). - Aortic valve: Possibly bicuspid; moderately thickened, moderately calcified leaflets. There was severe stenosis. There was trivial regurgitation. Valve area (VTI): 0.57 cm^2. Valve area (Vmax): 0.51 cm^2. Valve area (Vmean): 0.45 cm^2. - Mitral valve: Mild, late systolicprolapse, involving the posterior leaflet. There was mild regurgitation. - Pulmonary arteries: PA peak pressure: 33 mm Hg (S).  Laboratory data, EKG, and telemetry were all reviewed. The patient clearly has critical aortic stenosis with a progressive increase in her transvalvular gradients over the past few years. Her current echocardiogram demonstrates a mean gradient greater than 60 with an aortic valve area estimated at 0.5 cm. In the setting of syncope and critical aortic stenosis, she is at high risk of serious complications including sudden death. Her medical situation is complicated by the presence of CLL, now with platelets less than 50,000. Hematology consultation is pending.  We had a lengthy discussion regarding the natural history of aortic stenosis, and the associated prognosis once symptoms arise. It is not clear that there is any underlying illness to account for her sudden onset of symptoms with orthostatic hypotension. She does have a few white blood cells on urinalysis but there is not compelling evidence of a significant urinary tract infection. She otherwise felt well until her event this morning.  We also reviewed potential treatment options including surgical aortic valve replacement and transcatheter aortic valve replacement. The patient is highly functional, but the presence of CLL would certainly increase her risk of any  treatment. Will plan on cardiac catheterization after hematology consultation. Would prefer a radial approach to minimize bleeding risk. We will then obtain a formal cardiac surgery consultation. The patient saw Dr. Servando Snare in 2013 at which time she had minimal symptoms. At that point in time, she was recovering from carotid surgery and elected to defer any other surgeries until later.  We'll continue to follow with you. Will give some gentle IV fluids to try to improve her orthostasis.  Sherren Mocha, M.D. 01/11/2014 6:14 PM

## 2014-01-11 NOTE — H&P (Signed)
[facility] History and Physical                                                                                    Patient Demographics  Nicole Mercado, is a 74 y.o. female  MRN: [id number]   DOB - 1940/06/10  Admit Date - 01/11/2014  Outpatient Primary MD for the patient is Nicole Frames, MD   With History of -  Past Medical History  Diagnosis Date  . Thrombocytopenia 11/19/2010  . Hypertension   . Leukemia     slow leukemia---Nicole Mercado  . Arthritis   . Heart murmur   . Pancreatitis     h/o  . Carotid artery occlusion       Past Surgical History  Procedure Laterality Date  . Cesarean section      FIVE  . Cholecystectomy    . Appendectomy    . Abdominal hysterectomy    . Tonsillectomy    . Endarterectomy  06/09/2011    Procedure: ENDARTERECTOMY CAROTID;  Surgeon: Nicole Posner, MD;  Location: [facility] OR;  Service: Vascular;  Laterality: Left;  left carotid endarterectomy with patch angioplasty  . Carotid endarterectomy  06/09/11    LEFT  cea    in for   Chief Complaint  Patient presents with  . Loss of Consciousness     HPI  Nicole Mercado  is a 74 y.o. female, with a pmh of leukemia, thrombocytopenia, aortic stenosis, and carotid artery disease.  She describes feeling perfectly well until she was taking a shower this morning when she began to feel slightly off.  The next thing she recalls is waking up on the floor.  She called a friend to come help and he subsequently called EMS.  Nicole Mercado (the friend) reports that she mentioned to him she may have been leaning over washing her hair when she passed out.  Nicole Mercado reports passing out once before several months ago.  She has not started any new medications recently, she denies vomiting, HA, double vision, chest pain, abdominal pain, changes in bowel habits or dysuria.  She states she bruises easily.    ER work up included a CT head with was negative for acute intracranial abnormality, but positive for soft  tissue injury.  She has a head wound on the posterior right that the EDP is stapling closed.  Her U/A appears slightly positive for infection.  Serologies show platelets of 44k, and elevated WBC, and slightly elevated creatinine.  EKG is abnormal with arrhthymia that appears new from previous EKG.  We will admit Nicole Mercado for syncopal work up and request cardiology and hematology consultations.   Review of Systems    In addition to the HPI above,  No Fever-chills, No Headache, No changes with Vision or hearing, No problems swallowing food or Liquids, No Chest pain, Cough or Shortness of Breath, No Abdominal pain, No Nausea or Vomiting, Bowel movements are regular, No Blood in stool or Urine, No dysuria,  She reports frequent urination, but also tells me she drinks a lot of water. No new skin rashes or bruises, No new joints pains-aches,  No new weakness, tingling,  numbness in any extremity, No recent weight gain or loss, No significant Mental Stressors.  A full 10 point Review of Systems was done, except as stated above, all other Review of Systems were negative.   Social History History  Substance Use Topics  . Smoking status: Never Smoker   . Smokeless tobacco: Never Used  . Alcohol Use: No     Family History Family History  Problem Relation Age of Onset  . Heart disease Mother   . Hypertension Mother   . Heart attack Mother   . Heart disease Father   . Hypertension Father   . Hypertension Sister   . Hypertension Son      Prior to Admission medications   Medication Sig Start Date End Date Taking? Authorizing Provider  acetaminophen (TYLENOL) 500 MG tablet Take 1,000 mg by mouth every 6 (six) hours as needed. pain    Yes Historical Provider, MD  aspirin EC 81 MG tablet Take 81 mg by mouth daily.     Yes Historical Provider, MD  Calcium-Vitamin D 500-125 MG-UNIT TABS Take 1 tablet by mouth daily.    Yes Historical Provider, MD  lisinopril (PRINIVIL,ZESTRIL) 20 MG  tablet Take 20 mg by mouth daily.     Yes Historical Provider, MD  metoprolol succinate (TOPROL-XL) 50 MG 24 hr tablet Take 50 mg by mouth daily. Take 1 1/2 tab.Take with or immediately following a meal.   Yes Historical Provider, MD  Multiple Vitamins-Minerals (MULTIVITAMINS THER. W/MINERALS) TABS Take 1 tablet by mouth daily.     Yes Historical Provider, MD    No Known Allergies  Physical Exam  Vitals  Blood pressure 123/32, pulse 97, temperature 97.5 F (36.4 C), temperature source Oral, resp. rate 18, height 5' (1.524 m), weight 44.453 kg (98 lb), SpO2 100 %.   General:  lying in bed in NAD, wd, wn, smiling, very pleasant. Thick dried blood on right side of head.  RN cleaning wound.  Psych:  Normal affect and insight, Not Suicidal or Homicidal, Awake Alert, Oriented X 3.  Neuro:   No F.N deficits, C.Nerves grossly Intact, Strength 5/5 all 4 extremities  ENT:  Ears and Eyes appear Normal, Conjunctivae clear Moist Oral Mucosa.  Neck:  Supple Neck, No JVD, No cervical lymphadenopathy appreciated  Respiratory:  Symmetrical Chest wall movement, Good air movement bilaterally, CTAB.  Cardiac:  RRR, 3/6 systolic murmur, No Gallops, Rubs or Parasternal Heave.  Abdomen:  Positive Bowel Sounds, Abdomen Soft, Non tender, No organomegaly appreciated  Skin:  No Cyanosis, Normal Skin Turgor, No Skin Rash, + Bruise on LLE.  Extremities:  Good muscle tone,  joints appear normal , no effusions, Normal ROM.   Data Review  CBC  Recent Labs Lab 01/11/14 0826  WBC 14.8*  HGB 11.0*  HCT 32.7*  PLT 44*  MCV 94.5  MCH 31.8  MCHC 33.6  RDW 14.3  LYMPHSABS 11.2*  MONOABS 1.0  EOSABS 0.1  BASOSABS 0.0   ------------------------------------------------------------------------------------------------------------------  Chemistries   Recent Labs Lab 01/11/14 0826  NA 140  K 4.8  CL 106  CO2 26  GLUCOSE 112*  BUN 21  CREATININE 1.23*  CALCIUM 10.1      ---------------------------------------------------------------------------------------------------------------  Urinalysis    Component Value Date/Time   COLORURINE YELLOW 01/11/2014 0957   APPEARANCEUR HAZY* 01/11/2014 0957   LABSPEC 1.012 01/11/2014 0957   PHURINE 7.0 01/11/2014 0957   GLUCOSEU NEGATIVE 01/11/2014 0957   HGBUR TRACE* 01/11/2014 0957   [place] NEGATIVE 01/11/2014 0957  KETONESUR NEGATIVE 01/11/2014 0957   PROTEINUR NEGATIVE 01/11/2014 0957   UROBILINOGEN 0.2 01/11/2014 0957   NITRITE POSITIVE* 01/11/2014 0957   LEUKOCYTESUR SMALL* 01/11/2014 0957    ----------------------------------------------------------------------------------------------------------------  Imaging results:   Ct Head Wo Contrast  01/11/2014   CLINICAL DATA:  Fall in shower this morning with dizziness  EXAM: CT HEAD WITHOUT CONTRAST  CT CERVICAL SPINE WITHOUT CONTRAST  TECHNIQUE: Multidetector CT imaging of the head and cervical spine was performed following the standard protocol without intravenous contrast. Multiplanar CT image reconstructions of the cervical spine were also generated.  COMPARISON:  None  FINDINGS: CT HEAD FINDINGS  The bony calvarium is intact. A scalp hematoma and soft tissue laceration are noted in the right posterior parietal region. Mild atrophic changes are noted. Mild chronic white matter ischemic change is seen. No findings to suggest acute hemorrhage, acute infarction or space-occupying mass lesion are noted.  CT CERVICAL SPINE FINDINGS  Seven cervical segments are well visualized. Vertebral body height is well maintained. Multilevel osteophytic changes are noted as well as facet hypertrophic changes. The hypertrophic facet changes are predominately noted on the left. Calcifications of the carotid and vertebral arteries are noted. No acute fracture or acute facet abnormality is seen.  IMPRESSION: CT of the head: Soft tissue injury in the right parietal region  without acute intracranial abnormality.  CT of the cervical spine: Multilevel degenerative changes without acute abnormality. The facet hypertrophic changes are predominantly on the left.   Electronically Signed   By: Inez Catalina M.D.   On: 01/11/2014 10:08   Ct Cervical Spine Wo Contrast  01/11/2014   CLINICAL DATA:  Fall in shower this morning with dizziness  EXAM: CT HEAD WITHOUT CONTRAST  CT CERVICAL SPINE WITHOUT CONTRAST  TECHNIQUE: Multidetector CT imaging of the head and cervical spine was performed following the standard protocol without intravenous contrast. Multiplanar CT image reconstructions of the cervical spine were also generated.  COMPARISON:  None  FINDINGS: CT HEAD FINDINGS  The bony calvarium is intact. A scalp hematoma and soft tissue laceration are noted in the right posterior parietal region. Mild atrophic changes are noted. Mild chronic white matter ischemic change is seen. No findings to suggest acute hemorrhage, acute infarction or space-occupying mass lesion are noted.  CT CERVICAL SPINE FINDINGS  Seven cervical segments are well visualized. Vertebral body height is well maintained. Multilevel osteophytic changes are noted as well as facet hypertrophic changes. The hypertrophic facet changes are predominately noted on the left. Calcifications of the carotid and vertebral arteries are noted. No acute fracture or acute facet abnormality is seen.  IMPRESSION: CT of the head: Soft tissue injury in the right parietal region without acute intracranial abnormality.  CT of the cervical spine: Multilevel degenerative changes without acute abnormality. The facet hypertrophic changes are predominantly on the left.   Electronically Signed   By: Inez Catalina M.D.   On: 01/11/2014 10:08   Dg Chest Port 1 View  01/11/2014   CLINICAL DATA:  Syncopal episode.  Fall.  EXAM: PORTABLE CHEST - 1 VIEW  COMPARISON:  03/21/2011.  FINDINGS: Cardiopericardial silhouette within normal limits. Stable prominent  costochondral calcification at the RIGHT first rib end. Monitoring leads project over the chest. No airspace disease. No pleural effusion. No displaced rib fracture or pneumothorax in this patient with a fall. Aortic arch atherosclerosis.  IMPRESSION: No active cardiopulmonary disease and no interval change.   Electronically Signed   By: Dereck Ligas M.D.   On: 01/11/2014  09:05    My personal review of EKG: Rhythm irreg irreg.    Assessment & Plan  Principal Problem:   Syncope Active Problems:   Thrombocytopenia   CLL (chronic lymphocytic leukemia)   Carotid artery stenosis   Aortic stenosis   Arrhythmia   Syncope with arrhythmia Possibly cardiac given new arrhythmia and history of AS.  [place] [facility] consultation. 2 D Echo ordered. Check TSH. There was a report of being hypotensive with EMS.  Will check orthostatics.  HTN Patient appears normo tensive now.  But pulse rate is slightly elevated.  Will decrease metoprolol by 50% and monitor closely.  Checking orthostatics.  Given severe aortic stenosis, will leave [facility]F at [facility].  Head Wound Staples being placed in the ED.  Will monitor for continued bleeding.  If bleed is significant she may need a platelet transfusion.  Acute on Chronic Thombocytopenia (baseline platelets 70 - 77k) History of Chronic Lymphocytic Leukemia.  WBC actually improved compared to previous. Has seen Nicole. Ralene Ok in the past.  Nicole. Alvy Bimler will see her in consultation this afternoon.  Aortic Stenosis  Noted to be severe on last 2D echo in 2014.  Will repeat 2D echo.  Carotid occlusion Previous history of endartectomy.  Recent Carotid Dopplers completed in 08/2013 as ordered by [facility].  Will not reorder.  Elevated creatinine. Appears chronic.  Baseline 1.2  Possible UTI Will not start antibiotics at this time. Cultures pending.   DVT Prophylaxis  SCDs   AM Labs Ordered, also please review Full Orders  Family Communication:   Spoke with  friend - Educational psychologist.   Code Status:  full  Likely DC to  home  Condition:  guarded  Time spent in minutes : 60    York, Marianne L PA-C on 01/11/2014 at 1:34 PM  Between 7am to 4pm - Pager - [phone]  After 7pm go to [email or web address] - password [email or web address]  And look for the night coverage person covering me after hours  [facility] Office  [phone]  I have seen and evaluated this patient and agree with the PA notes.  Patient with h/o CLL with not mention of previous treatment, h/o carotid artery stenosis s/p CEA in 2013, last carotid artery doppler in 08/2013, h/o aortic stenosis lost cardiology followup presented with syncope/superficial scalp laceration/orhtostatic hypotension/UTI?/thrombocytopenia worse than baseline/new arrythemia vs poor ekg baseline. Cardiology/oncology consulted. On ivf, empiric abx, culture pending.

## 2014-01-11 NOTE — Telephone Encounter (Signed)
Positive Hepatitis C Sent to EDP for review

## 2014-01-11 NOTE — Progress Notes (Signed)
  Echocardiogram 2D Echocardiogram has been performed.  Nicole Mercado 01/11/2014, 4:46 PM

## 2014-01-11 NOTE — ED Notes (Signed)
MD at bedside. 

## 2014-01-11 NOTE — ED Notes (Signed)
Posterior head/hair cleaned with sur-cleanse, peroxide/saline mix. Small laceration ~1cm noted to posterior scalp with abrasion noted to area. Pt tolerated well; denies pain.

## 2014-01-12 DIAGNOSIS — I6529 Occlusion and stenosis of unspecified carotid artery: Secondary | ICD-10-CM

## 2014-01-12 DIAGNOSIS — I350 Nonrheumatic aortic (valve) stenosis: Principal | ICD-10-CM

## 2014-01-12 LAB — BASIC METABOLIC PANEL
ANION GAP: 5 (ref 5–15)
BUN: 21 mg/dL (ref 6–23)
CALCIUM: 9.3 mg/dL (ref 8.4–10.5)
CO2: 24 mmol/L (ref 19–32)
Chloride: 110 mEq/L (ref 96–112)
Creatinine, Ser: 1.15 mg/dL — ABNORMAL HIGH (ref 0.50–1.10)
GFR calc non Af Amer: 46 mL/min — ABNORMAL LOW (ref >90)
GFR, EST AFRICAN AMERICAN: 53 mL/min — AB (ref >90)
Glucose, Bld: 110 mg/dL — ABNORMAL HIGH (ref 70–99)
Potassium: 4 mmol/L (ref 3.5–5.1)
Sodium: 139 mmol/L (ref 135–145)

## 2014-01-12 LAB — HEPATIC FUNCTION PANEL
ALT: 13 U/L (ref 0–35)
AST: 23 U/L (ref 0–37)
Albumin: 3.1 g/dL — ABNORMAL LOW (ref 3.5–5.2)
Alkaline Phosphatase: 59 U/L (ref 39–117)
Total Bilirubin: 0.6 mg/dL (ref 0.3–1.2)
Total Protein: 5.1 g/dL — ABNORMAL LOW (ref 6.0–8.3)

## 2014-01-12 LAB — FIBRINOGEN: Fibrinogen: 309 mg/dL (ref 204–475)

## 2014-01-12 LAB — CBC
HEMATOCRIT: 27 % — AB (ref 36.0–46.0)
HEMOGLOBIN: 9.1 g/dL — AB (ref 12.0–15.0)
MCH: 31.7 pg (ref 26.0–34.0)
MCHC: 33.7 g/dL (ref 30.0–36.0)
MCV: 94.1 fL (ref 78.0–100.0)
Platelets: 36 10*3/uL — ABNORMAL LOW (ref 150–400)
RBC: 2.87 MIL/uL — ABNORMAL LOW (ref 3.87–5.11)
RDW: 14.4 % (ref 11.5–15.5)
WBC: 17.5 10*3/uL — ABNORMAL HIGH (ref 4.0–10.5)

## 2014-01-12 LAB — VITAMIN B12

## 2014-01-12 LAB — TROPONIN I: Troponin I: 0.03 ng/mL (ref <0.031)

## 2014-01-12 LAB — PATHOLOGIST SMEAR REVIEW

## 2014-01-12 LAB — LACTATE DEHYDROGENASE: LDH: 181 U/L (ref 94–250)

## 2014-01-12 MED ORDER — SODIUM CHLORIDE 0.9 % IJ SOLN: 3.0000 mL | Freq: Two times a day (BID) | INTRAMUSCULAR | Status: DC

## 2014-01-12 MED ORDER — SODIUM CHLORIDE 0.9 % IJ SOLN: 3.0000 mL | INTRAMUSCULAR | Status: DC | PRN

## 2014-01-12 MED ORDER — SODIUM CHLORIDE 0.9 % IV SOLN: 250.0000 mL | INTRAVENOUS | Status: DC | PRN

## 2014-01-12 NOTE — Progress Notes (Signed)
Subjective: Denies dizzness (in bed)  No SOB  No CP   Objective: Filed Vitals:   01/11/14 1425 01/11/14 2029 01/11/14 2120 01/12/14 0504  BP: 130/50 129/41 158/48 128/46  Pulse: 100 74 74 77  Temp:  98.5 F (36.9 C)  98.5 F (36.9 C)  TempSrc:  Oral  Oral  Resp:  18  18  Height:      Weight:      SpO2:  98% 99% 99%   Weight change:  No intake or output data in the 24 hours ending 01/12/14 0800  General: Alert, awake, oriented x3, in no acute distress Neck:  JVP is normal Heart: Regular rate and rhythm, III/VI sysotlic murmur  rubs, gallops.  Lungs: Crackles at R base that clear with cough   Exemities:  No edema.   Neuro: Grossly intact, nonfocal.  TEle:  SR PACs   Lab Results: Results for orders placed or performed during the hospital encounter of 01/11/14 (from the past 24 hour(s))  CBC with Differential     Status: Abnormal   Collection Time: 01/11/14  8:26 AM  Result Value Ref Range   WBC 14.8 (H) 4.0 - 10.5 K/uL   RBC 3.46 (L) 3.87 - 5.11 MIL/uL   Hemoglobin 11.0 (L) 12.0 - 15.0 g/dL   HCT 32.7 (L) 36.0 - 46.0 %   MCV 94.5 78.0 - 100.0 fL   MCH 31.8 26.0 - 34.0 pg   MCHC 33.6 30.0 - 36.0 g/dL   RDW 14.3 11.5 - 15.5 %   Platelets 44 (L) 150 - 400 K/uL   Neutrophils Relative % 17 (L) 43 - 77 %   Lymphocytes Relative 75 (H) 12 - 46 %   Monocytes Relative 7 3 - 12 %   Eosinophils Relative 1 0 - 5 %   Basophils Relative 0 0 - 1 %   Neutro Abs 2.5 1.7 - 7.7 K/uL   Lymphs Abs 11.2 (H) 0.7 - 4.0 K/uL   Monocytes Absolute 1.0 0.1 - 1.0 K/uL   Eosinophils Absolute 0.1 0.0 - 0.7 K/uL   Basophils Absolute 0.0 0.0 - 0.1 K/uL   WBC Morphology ATYPICAL LYMPHOCYTES    Smear Review LARGE PLATELETS PRESENT   Basic metabolic panel     Status: Abnormal   Collection Time: 01/11/14  8:26 AM  Result Value Ref Range   Sodium 140 135 - 145 mmol/L   Potassium 4.8 3.5 - 5.1 mmol/L   Chloride 106 96 - 112 mEq/L   CO2 26 19 - 32 mmol/L   Glucose, Bld 112 (H) 70 - 99 mg/dL   BUN 21 6 - 23 mg/dL   Creatinine, Ser 1.23 (H) 0.50 - 1.10 mg/dL   Calcium 10.1 8.4 - 10.5 mg/dL   GFR calc non Af Amer 42 (L) >90 mL/min   GFR calc Af Amer 49 (L) >90 mL/min   Anion gap 8 5 - 15  Urinalysis, Routine w reflex microscopic     Status: Abnormal   Collection Time: 01/11/14  9:57 AM  Result Value Ref Range   Color, Urine YELLOW YELLOW   APPearance HAZY (A) CLEAR   Specific Gravity, Urine 1.012 1.005 - 1.030   pH 7.0 5.0 - 8.0   Glucose, UA NEGATIVE NEGATIVE mg/dL   Hgb urine dipstick TRACE (A) NEGATIVE   Bilirubin Urine NEGATIVE NEGATIVE   Ketones, ur NEGATIVE NEGATIVE mg/dL   Protein, ur NEGATIVE NEGATIVE mg/dL   Urobilinogen, UA 0.2 0.0 - 1.0 mg/dL  Nitrite POSITIVE (A) NEGATIVE   Leukocytes, UA SMALL (A) NEGATIVE  Urine microscopic-add on     Status: Abnormal   Collection Time: 01/11/14  9:57 AM  Result Value Ref Range   Squamous Epithelial / LPF FEW (A) RARE   WBC, UA 7-10 <3 WBC/hpf   RBC / HPF 0-2 <3 RBC/hpf   Bacteria, UA MANY (A) RARE  Save smear     Status: None   Collection Time: 01/11/14  5:05 PM  Result Value Ref Range   Smear Review SMEAR STAINED AND AVAILABLE FOR REVIEW   TSH     Status: None   Collection Time: 01/11/14  6:46 PM  Result Value Ref Range   TSH 0.705 0.350 - 4.500 uIU/mL  Troponin I     Status: None   Collection Time: 01/11/14  6:57 PM  Result Value Ref Range   Troponin I <0.03 <0.031 ng/mL  Basic metabolic panel     Status: Abnormal   Collection Time: 01/12/14  6:19 AM  Result Value Ref Range   Sodium 139 135 - 145 mmol/L   Potassium 4.0 3.5 - 5.1 mmol/L   Chloride 110 96 - 112 mEq/L   CO2 24 19 - 32 mmol/L   Glucose, Bld 110 (H) 70 - 99 mg/dL   BUN 21 6 - 23 mg/dL   Creatinine, Ser 1.15 (H) 0.50 - 1.10 mg/dL   Calcium 9.3 8.4 - 10.5 mg/dL   GFR calc non Af Amer 46 (L) >90 mL/min   GFR calc Af Amer 53 (L) >90 mL/min   Anion gap 5 5 - 15  Troponin I     Status: None   Collection Time: 01/12/14  6:19 AM  Result Value Ref  Range   Troponin I 0.03 <0.031 ng/mL  Hepatic function panel     Status: Abnormal   Collection Time: 01/12/14  6:19 AM  Result Value Ref Range   Total Protein 5.1 (L) 6.0 - 8.3 g/dL   Albumin 3.1 (L) 3.5 - 5.2 g/dL   AST 23 0 - 37 U/L   ALT 13 0 - 35 U/L   Alkaline Phosphatase 59 39 - 117 U/L   Total Bilirubin 0.6 0.3 - 1.2 mg/dL   Bilirubin, Direct <0.1 0.0 - 0.3 mg/dL   Indirect Bilirubin NOT CALCULATED 0.3 - 0.9 mg/dL  Fibrinogen     Status: None   Collection Time: 01/12/14  6:19 AM  Result Value Ref Range   Fibrinogen 309 204 - 475 mg/dL  Lactate dehydrogenase     Status: None   Collection Time: 01/12/14  6:19 AM  Result Value Ref Range   LDH 181 94 - 250 U/L    Studies/Results: Ct Head Wo Contrast  01/11/2014   CLINICAL DATA:  Fall in shower this morning with dizziness  EXAM: CT HEAD WITHOUT CONTRAST  CT CERVICAL SPINE WITHOUT CONTRAST  TECHNIQUE: Multidetector CT imaging of the head and cervical spine was performed following the standard protocol without intravenous contrast. Multiplanar CT image reconstructions of the cervical spine were also generated.  COMPARISON:  None  FINDINGS: CT HEAD FINDINGS  The bony calvarium is intact. A scalp hematoma and soft tissue laceration are noted in the right posterior parietal region. Mild atrophic changes are noted. Mild chronic white matter ischemic change is seen. No findings to suggest acute hemorrhage, acute infarction or space-occupying mass lesion are noted.  CT CERVICAL SPINE FINDINGS  Seven cervical segments are well visualized. Vertebral body height is well maintained.  Multilevel osteophytic changes are noted as well as facet hypertrophic changes. The hypertrophic facet changes are predominately noted on the left. Calcifications of the carotid and vertebral arteries are noted. No acute fracture or acute facet abnormality is seen.  IMPRESSION: CT of the head: Soft tissue injury in the right parietal region without acute intracranial  abnormality.  CT of the cervical spine: Multilevel degenerative changes without acute abnormality. The facet hypertrophic changes are predominantly on the left.   Electronically Signed   By: Inez Catalina M.D.   On: 01/11/2014 10:08   Ct Cervical Spine Wo Contrast  01/11/2014   CLINICAL DATA:  Fall in shower this morning with dizziness  EXAM: CT HEAD WITHOUT CONTRAST  CT CERVICAL SPINE WITHOUT CONTRAST  TECHNIQUE: Multidetector CT imaging of the head and cervical spine was performed following the standard protocol without intravenous contrast. Multiplanar CT image reconstructions of the cervical spine were also generated.  COMPARISON:  None  FINDINGS: CT HEAD FINDINGS  The bony calvarium is intact. A scalp hematoma and soft tissue laceration are noted in the right posterior parietal region. Mild atrophic changes are noted. Mild chronic white matter ischemic change is seen. No findings to suggest acute hemorrhage, acute infarction or space-occupying mass lesion are noted.  CT CERVICAL SPINE FINDINGS  Seven cervical segments are well visualized. Vertebral body height is well maintained. Multilevel osteophytic changes are noted as well as facet hypertrophic changes. The hypertrophic facet changes are predominately noted on the left. Calcifications of the carotid and vertebral arteries are noted. No acute fracture or acute facet abnormality is seen.  IMPRESSION: CT of the head: Soft tissue injury in the right parietal region without acute intracranial abnormality.  CT of the cervical spine: Multilevel degenerative changes without acute abnormality. The facet hypertrophic changes are predominantly on the left.   Electronically Signed   By: Inez Catalina M.D.   On: 01/11/2014 10:08   Dg Chest Port 1 View  01/11/2014   CLINICAL DATA:  Syncopal episode.  Fall.  EXAM: PORTABLE CHEST - 1 VIEW  COMPARISON:  03/21/2011.  FINDINGS: Cardiopericardial silhouette within normal limits. Stable prominent costochondral calcification  at the RIGHT first rib end. Monitoring leads project over the chest. No airspace disease. No pleural effusion. No displaced rib fracture or pneumothorax in this patient with a fall. Aortic arch atherosclerosis.  IMPRESSION: No active cardiopulmonary disease and no interval change.   Electronically Signed   By: Dereck Ligas M.D.   On: 01/11/2014 09:05    Medications: REviewed   @PROBHOSP @   1.  Syncope  Patient was orthostatic last night  ? If contrib on own in setting of severe AS  Will check orthostatics again this AM after hydration.  2.  Aortic stenosis.  Echo with severe/critical AS with mean grad of 61 mm Hg  Ezzie Dural has discussed with patient the options  Surgery to see patient  She will need a cardiac cath if intervention planned.  3.  Heme  Patinet seen by Dr Alvy Bimler yesterday  I spoke to her this AM  Patinet has declined chemo in past.   Per Dr Alvy Bimler if patient is due to have cath would give platelets 1 hour before procedure.   She would recomm steroids to increase perip plt count  This will take about 5-7 days for effect to be seen  Would be in time for other interventions  Service will follow.  Will check back later today to see progress.    LOS:  1 day   Dorris Carnes 01/12/2014, 8:00 AM

## 2014-01-12 NOTE — Progress Notes (Signed)
Nicole Mercado   DOB:01-Feb-1940   ZO#:[id number]   JWJ#:[id number]  Patient Care Team: Andria Frames, MD as PCP - General (Family Medicine) Andria Frames, MD (Family Medicine) Rebecca Eaton, MD (Cardiology)  I have seen the patient, examined her and edited the notes as follows  Subjective: Patient seen and examined. No new events overnight. Denies fever, chills, night sweats. Denies worsening headaches. No vision changes. Denies nausea, vomiting or new syncopal episodes. Denies chest pain or palpitations. Denies respiratory complaints. Denies abdominal pain. No bleeding issues reported. Alert and conversant.  Scheduled Meds: . cefTRIAXone (ROCEPHIN)  IV  1 g Intravenous Q24H  . folic acid  1 mg Oral Daily  . metoprolol tartrate  25 mg Oral BID  . predniSONE  60 mg Oral Q breakfast  . sodium chloride  3 mL Intravenous Q12H   Continuous Infusions: . sodium chloride 10 mL/hr at 01/11/14 1400   PRN Meds:acetaminophen **OR** acetaminophen, acetaminophen, alum & mag hydroxide-simeth, ondansetron **OR** ondansetron (ZOFRAN) IV, oxyCODONE, polyethylene glycol   Objective:  Filed Vitals:   01/12/14 0900  BP: 121/32  Pulse:   Temp:   Resp:       Intake/Output Summary (Last 24 hours) at 01/12/14 1038 Last data filed at 01/12/14 0932  Gross per 24 hour  Intake    480 ml  Output      0 ml  Net    480 ml    GENERAL:alert, no distress and comfortable SKIN: skin color, texture, turgor are normal, no rashes or significant lesions. Left leg bruising and right parietal soft tissue injury due to fall. Head wrapped due to laceration in the right parietal area EYES: normal, conjunctiva are pink and non-injected, sclera clear OROPHARYNX:no exudate, no erythema and lips, buccal mucosa, and tongue normal  NECK: supple, thyroid normal size, non-tender, without nodularity LYMPH: Palpable lymphadenopathy in the neck. None elsewhere.  LUNGS: clear to auscultation and percussion with normal  breathing effort HEART: regular rate & rhythm with loud 3/6 systolic murmurs and no lower extremity edema.  ABDOMEN:abdomen soft, non-tender and normal bowel sounds. No splenomegaly Musculoskeletal:no cyanosis of digits and no clubbing PSYCH: alert & oriented x 3 with fluent speech NEURO: no focal motor/sensory deficits    CBG (last 3)  No results for input(s): GLUCAP in the last 72 hours.   Labs:   Recent Labs Lab 01/11/14 0826 01/12/14 0619  WBC 14.8* 17.5*  HGB 11.0* 9.1*  HCT 32.7* 27.0*  PLT 44* 36*  MCV 94.5 94.1  MCH 31.8 31.7  MCHC 33.6 33.7  RDW 14.3 14.4  LYMPHSABS 11.2*  --   MONOABS 1.0  --   EOSABS 0.1  --   BASOSABS 0.0  --      Chemistries:    Recent Labs Lab 01/11/14 0826 01/12/14 0619  NA 140 139  K 4.8 4.0  CL 106 110  CO2 26 24  GLUCOSE 112* 110*  BUN 21 21  CREATININE 1.23* 1.15*  CALCIUM 10.1 9.3  AST  --  23  ALT  --  13  ALKPHOS  --  59  BILITOT  --  0.6    GFR Estimated Creatinine Clearance: 30.6 mL/min (by C-G formula based on Cr of 1.15).  Liver Function Tests:  Recent Labs Lab 01/12/14 0619  AST 23  ALT 13  ALKPHOS 59  BILITOT 0.6  PROT 5.1*  ALBUMIN 3.1*    Urine Studies     Component Value Date/Time   COLORURINE YELLOW  01/11/2014 0957   APPEARANCEUR HAZY* 01/11/2014 0957   LABSPEC 1.012 01/11/2014 0957   PHURINE 7.0 01/11/2014 0957   GLUCOSEU NEGATIVE 01/11/2014 0957   HGBUR TRACE* 01/11/2014 0957   BILIRUBINUR NEGATIVE 01/11/2014 0957   KETONESUR NEGATIVE 01/11/2014 0957   PROTEINUR NEGATIVE 01/11/2014 0957   UROBILINOGEN 0.2 01/11/2014 0957   NITRITE POSITIVE* 01/11/2014 0957   LEUKOCYTESUR SMALL* 01/11/2014 0957    Coagulation profile No results for input(s): INR, PROTIME in the last 168 hours.  Cardiac Enzymes:  Recent Labs Lab 01/11/14 1857 01/12/14 0619  TROPONINI <0.03 0.03    Recent Labs  01/11/14 1846  TSH 0.705   Assessment/Plan: 74 y.o.   Chronic Lymphcoytic Leukemia  (CLL)  On observation only  Admission WBC was 14,800, now increased to 17.5 after steroid initiation This is not very high. Clinically, she is at RAI stage IV. She does not need urgent treatment for this.  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 Additional workup was ordered including liver function tests is normal. Serum vitamin B-12 is pending. Fibrinogen level is normal at 309. She was started on vitamin B-34 and folic acid on 1/6. Empiric treatment with prednisone was initiated today, 1/7 for possible ITP which is a low risk treatment.  The rationale of this was discussed with the patient and she agreed to proceed. If cardiac catheterization needed, patient can receive 1 unit of platelets prior, to ideally increase count to 70,000.  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  On observation only  Syncopal Episode In the setting of irregular heart beat, orthostatic hypotension andsevere aortic stenosis 2 D echo remarkable for critical aortic stenosis, EF 60-65 percent. Possible cardiac catheterization via radial approach to minimize bleeding issues Surgery to see patient today as well Appreciate cardiology follow up  Valvular heart disease She will need heart catheterization prior to valve replacement surgery. I discussed with cardiologic and recommend prophylactic platelet transfusion before heart catheterization.  DVT prophylaxis On mechanical devices  Renal insufficiency Chronic, baseline 1.2 Expected to improve with IV hydration  UTI Urinalysis positive for nitrite, cultures pending On  Rocephin IV Day 2 by primary team  Full Code  Other medical issues as per admitting team     **Disclaimer: This note was dictated with voice recognition software. Similar sounding words can inadvertently be transcribed and this note may contain transcription errors which may not have been corrected upon publication of note.Sharene Butters E, PA-C 01/12/2014  10:38 AM Laddie Math, MD 01/12/2014

## 2014-01-12 NOTE — Progress Notes (Signed)
Utilization Review Completed.Genieve Ramaswamy T1/07/2014  

## 2014-01-12 NOTE — Progress Notes (Signed)
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 Alvy Bimler -leukocytosis--chronic--likely related to CLL -no signs of infection, afebrile and hemodynamically stable -Baseline platelets 70-77K Bacteriuria -No significant pyuria to suggest UTI -Discontinue antibiotics and observe Hypertension -Lisinopril discontinued -Continue metoprolol CKD stage 2-3 -Baseline creatinine 0.8-1.2 Carotid occlusion -Previous history of endartectomy. Recent Carotid Dopplers completed in 08/2013 as ordered by [facility]. Will not reorder.  Family Communication:   Pt at beside Disposition Plan:   Home when medically stable       Procedures/Studies: Ct Head Wo Contrast  01/11/2014   CLINICAL DATA:  Fall in shower this morning with dizziness  EXAM: CT HEAD WITHOUT CONTRAST  CT CERVICAL SPINE WITHOUT CONTRAST  TECHNIQUE: Multidetector CT imaging of the head and cervical spine was performed following the standard protocol without intravenous contrast. Multiplanar CT image reconstructions of the cervical spine were also generated.  COMPARISON:  None  FINDINGS: CT HEAD FINDINGS  The bony calvarium is intact. A scalp hematoma and soft tissue laceration are noted in the right posterior parietal region. Mild atrophic changes are noted. Mild chronic white matter ischemic change is seen. No findings to suggest acute hemorrhage, acute infarction or space-occupying mass lesion are noted.  CT CERVICAL SPINE FINDINGS  Seven cervical segments are  well visualized. Vertebral body height is well maintained. Multilevel osteophytic changes are noted as well as facet hypertrophic changes. The hypertrophic facet changes are predominately noted on the left. Calcifications of the carotid and vertebral arteries are noted. No acute fracture or acute facet abnormality is seen.  IMPRESSION: CT of the head: Soft tissue injury in the right parietal region without acute intracranial abnormality.  CT of the cervical spine: Multilevel degenerative changes without acute abnormality. The facet hypertrophic changes are predominantly on the left.   Electronically Signed   By: Inez Catalina M.D.   On: 01/11/2014 10:08   Ct Cervical Spine Wo Contrast  01/11/2014   CLINICAL DATA:  Fall in shower this morning with dizziness  EXAM: CT HEAD WITHOUT CONTRAST  CT CERVICAL SPINE WITHOUT CONTRAST  TECHNIQUE: Multidetector CT imaging of the head and cervical spine was performed following the standard protocol without intravenous contrast. Multiplanar CT image reconstructions of the cervical spine were also generated.  COMPARISON:  None  FINDINGS: CT HEAD FINDINGS  The bony calvarium is intact. A scalp hematoma and soft tissue laceration are noted in the right posterior parietal region. Mild atrophic changes are noted. Mild chronic white matter ischemic change is seen. No findings to suggest acute hemorrhage, acute infarction or space-occupying mass lesion are noted.  CT CERVICAL SPINE FINDINGS  Seven cervical segments are well visualized. Vertebral body height is well maintained. Multilevel osteophytic changes are noted as well as facet hypertrophic changes. The hypertrophic facet changes are predominately noted on the left. Calcifications of the carotid and vertebral arteries are noted. No acute fracture or acute facet abnormality is seen.  IMPRESSION: CT of the head: Soft tissue injury in the right parietal region without acute intracranial abnormality.  CT of the cervical spine:  Multilevel degenerative changes without acute abnormality. The  facet hypertrophic changes are predominantly on the left.   Electronically Signed   By: Inez Catalina M.D.   On: 01/11/2014 10:08   Dg Chest Port 1 View  01/11/2014   CLINICAL DATA:  Syncopal episode.  Fall.  EXAM: PORTABLE CHEST - 1 VIEW  COMPARISON:  03/21/2011.  FINDINGS: Cardiopericardial silhouette within normal limits. Stable prominent costochondral calcification at the RIGHT first rib end. Monitoring leads project over the chest. No airspace disease. No pleural effusion. No displaced rib fracture or pneumothorax in this patient with a fall. Aortic arch atherosclerosis.  IMPRESSION: No active cardiopulmonary disease and no interval change.   Electronically Signed   By: Dereck Ligas M.D.   On: 01/11/2014 09:05         Subjective: Patient denies fevers, chills, headache, chest pain, dyspnea, nausea, vomiting, diarrhea, abdominal pain, dysuria, hematuria   Objective: Filed Vitals:   01/11/14 2120 01/12/14 0504 01/12/14 0900 01/12/14 1340  BP: 158/48 128/46 121/32 132/46  Pulse: 74 77  78  Temp:  98.5 F (36.9 C)  97.7 F (36.5 C)  TempSrc:  Oral  Oral  Resp:  18  18  Height:      Weight:      SpO2: 99% 99%  100%    Intake/Output Summary (Last 24 hours) at 01/12/14 1358 Last data filed at 01/12/14 1341  Gross per 24 hour  Intake    720 ml  Output      0 ml  Net    720 ml   Weight change:  Exam:   General:  Pt is alert, follows commands appropriately, not in acute distress  HEENT: No icterus, No thrush, [place]/AT  Cardiovascular: RRR, S1/S2, no rubs, no gallops  Respiratory: CTA bilaterally, no wheezing, no crackles, no rhonchi  Abdomen: Soft/+BS, non tender, non distended, no guarding  Extremities: trace LEedema, No lymphangitis, No petechiae, No rashes, no synovitis  Data Reviewed: Basic Metabolic Panel:  Recent Labs Lab 01/11/14 0826 01/12/14 0619  NA 140 139  K 4.8 4.0  CL 106 110  CO2 26  24  GLUCOSE 112* 110*  BUN 21 21  CREATININE 1.23* 1.15*  CALCIUM 10.1 9.3   Liver Function Tests:  Recent Labs Lab 01/12/14 0619  AST 23  ALT 13  ALKPHOS 59  BILITOT 0.6  PROT 5.1*  ALBUMIN 3.1*   No results for input(s): LIPASE, AMYLASE in the last 168 hours. No results for input(s): AMMONIA in the last 168 hours. CBC:  Recent Labs Lab 01/11/14 0826 01/12/14 0619  WBC 14.8* 17.5*  NEUTROABS 2.5  --   HGB 11.0* 9.1*  HCT 32.7* 27.0*  MCV 94.5 94.1  PLT 44* 36*   Cardiac Enzymes:  Recent Labs Lab 01/11/14 1857 01/12/14 0619  TROPONINI <0.03 0.03   BNP: Invalid input(s): POCBNP CBG: No results for input(s): GLUCAP in the last 168 hours.  Recent Results (from the past 240 hour(s))  Urine culture     Status: None (Preliminary result)   Collection Time: 01/11/14  9:57 AM  Result Value Ref Range Status   Specimen Description URINE, CLEAN CATCH  Final   Special Requests NONE  Final   Colony Count   Final    >=100,000 COLONIES/ML Performed at [facility]    Culture   Final    ESCHERICHIA COLI Performed at [facility]    Report Status PENDING  Incomplete     Scheduled Meds: . cefTRIAXone (ROCEPHIN)  IV  1 g Intravenous  G26R  . folic acid  1 mg Oral Daily  . metoprolol tartrate  25 mg Oral BID  . predniSONE  60 mg Oral Q breakfast  . sodium chloride  3 mL Intravenous Q12H   Continuous Infusions: . sodium chloride 10 mL/hr at 01/11/14 1400     Vicente Weidler, DO  [facility] Pager [phone]  If 7PM-7AM, please contact night-coverage [email or web address] Password [email or web address] 01/12/2014, 1:58 PM   LOS: 1 day

## 2014-01-13 ENCOUNTER — Encounter (HOSPITAL_COMMUNITY)
Admission: EM | Disposition: A | Discharge status: Skilled Nursing Facility | Payer: Self-pay | Source: Home / Self Care | Attending: Surgery

## 2014-01-13 ENCOUNTER — Encounter (HOSPITAL_COMMUNITY): Payer: Self-pay | Admitting: Interventional Cardiology

## 2014-01-13 DIAGNOSIS — I2510 Atherosclerotic heart disease of native coronary artery without angina pectoris: Secondary | ICD-10-CM

## 2014-01-13 HISTORY — PX: LEFT AND RIGHT HEART CATHETERIZATION WITH CORONARY ANGIOGRAM: SHX5449

## 2014-01-13 LAB — BASIC METABOLIC PANEL
Anion gap: 5 (ref 5–15)
BUN: 17 mg/dL (ref 6–23)
CHLORIDE: 109 meq/L (ref 96–112)
CO2: 24 mmol/L (ref 19–32)
CREATININE: 1.06 mg/dL (ref 0.50–1.10)
Calcium: 9.3 mg/dL (ref 8.4–10.5)
GFR, EST AFRICAN AMERICAN: 59 mL/min — AB (ref >90)
GFR, EST NON AFRICAN AMERICAN: 51 mL/min — AB (ref >90)
Glucose, Bld: 105 mg/dL — ABNORMAL HIGH (ref 70–99)
POTASSIUM: 3.6 mmol/L (ref 3.5–5.1)
Sodium: 138 mmol/L (ref 135–145)

## 2014-01-13 LAB — CBC
HEMATOCRIT: 25.8 % — AB (ref 36.0–46.0)
HEMOGLOBIN: 8.7 g/dL — AB (ref 12.0–15.0)
MCH: 32.1 pg (ref 26.0–34.0)
MCHC: 33.7 g/dL (ref 30.0–36.0)
MCV: 95.2 fL (ref 78.0–100.0)
PLATELETS: 40 10*3/uL — AB (ref 150–400)
RBC: 2.71 MIL/uL — AB (ref 3.87–5.11)
RDW: 14.4 % (ref 11.5–15.5)
WBC: 22.1 10*3/uL — ABNORMAL HIGH (ref 4.0–10.5)

## 2014-01-13 LAB — URINE CULTURE

## 2014-01-13 LAB — POCT I-STAT 3, VENOUS BLOOD GAS (G3P V)
ACID-BASE DEFICIT: 1 mmol/L (ref 0.0–2.0)
Bicarbonate: 23.6 mEq/L (ref 20.0–24.0)
O2 SAT: 61 %
TCO2: 25 mmol/L (ref 0–100)
pCO2, Ven: 38.7 mmHg — ABNORMAL LOW (ref 45.0–50.0)
pH, Ven: 7.392 — ABNORMAL HIGH (ref 7.250–7.300)
pO2, Ven: 32 mmHg (ref 30.0–45.0)

## 2014-01-13 LAB — PROTIME-INR
INR: 1.07 (ref 0.00–1.49)
Prothrombin Time: 14 seconds (ref 11.6–15.2)

## 2014-01-13 LAB — POCT I-STAT 3, ART BLOOD GAS (G3+)
ACID-BASE DEFICIT: 1 mmol/L (ref 0.0–2.0)
Bicarbonate: 23.2 mEq/L (ref 20.0–24.0)
O2 Saturation: 100 %
PH ART: 7.428 (ref 7.350–7.450)
PO2 ART: 171 mmHg — AB (ref 80.0–100.0)
TCO2: 24 mmol/L (ref 0–100)
pCO2 arterial: 35.1 mmHg (ref 35.0–45.0)

## 2014-01-13 SURGERY — LEFT AND RIGHT HEART CATHETERIZATION WITH CORONARY ANGIOGRAM
Anesthesia: LOCAL

## 2014-01-13 MED ORDER — ONDANSETRON HCL 4 MG/2ML IJ SOLN: 4.0000 mg | Freq: Four times a day (QID) | INTRAMUSCULAR | Status: DC | PRN

## 2014-01-13 MED ORDER — LIDOCAINE HCL (PF) 1 % IJ SOLN
INTRAMUSCULAR | Status: AC
  Filled 2014-01-13: qty 30

## 2014-01-13 MED ORDER — SODIUM CHLORIDE 0.9 % IV SOLN
1.0000 mL/kg/h | INTRAVENOUS | Status: AC
  Administered 2014-01-13: 1 mL/kg/h via INTRAVENOUS

## 2014-01-13 MED ORDER — SODIUM CHLORIDE 0.9 % IV SOLN: Freq: Once | INTRAVENOUS | Status: DC

## 2014-01-13 MED ORDER — ACETAMINOPHEN 325 MG PO TABS: 650.0000 mg | ORAL_TABLET | ORAL | Status: DC | PRN

## 2014-01-13 MED ORDER — NITROGLYCERIN 1 MG/10 ML FOR IR/CATH LAB
INTRA_ARTERIAL | Status: AC
  Filled 2014-01-13: qty 10

## 2014-01-13 MED ORDER — HEPARIN (PORCINE) IN NACL 2-0.9 UNIT/ML-% IJ SOLN
INTRAMUSCULAR | Status: AC
  Filled 2014-01-13: qty 1000

## 2014-01-13 MED ORDER — MIDAZOLAM HCL 2 MG/2ML IJ SOLN
INTRAMUSCULAR | Status: AC
  Filled 2014-01-13: qty 2

## 2014-01-13 MED ORDER — VERAPAMIL HCL 2.5 MG/ML IV SOLN
INTRAVENOUS | Status: AC
  Filled 2014-01-13: qty 2

## 2014-01-13 MED ORDER — HEPARIN SODIUM (PORCINE) 1000 UNIT/ML IJ SOLN
INTRAMUSCULAR | Status: AC
  Filled 2014-01-13: qty 1

## 2014-01-13 MED ORDER — FENTANYL CITRATE 0.05 MG/ML IJ SOLN
INTRAMUSCULAR | Status: AC
  Filled 2014-01-13: qty 2

## 2014-01-13 NOTE — Progress Notes (Signed)
Physical Therapy Treatment Patient Details Name: Nicole Mercado MRN: [id number] DOB: 1940/06/10 Today's Date: 01/13/2014    History of Present Illness Nicole Mercado  is a 74 y.o. female, with a pmh of leukemia, thrombocytopenia, aortic stenosis, and carotid artery disease.  She describes feeling perfectly well until she was taking a shower this morning when she began to feel slightly off.  The next thing she recalls is waking up on the floor.  She called a friend to come help and he subsequently called EMS.  Mr. Nicole Mercado (the friend) reports that she mentioned to him she may have been leaning over washing her hair when she passed out.  Nicole Mercado reports passing out once before several months ago.  She has not started any new medications recently, she denies vomiting, HA, double vision, chest pain, abdominal pain, changes in bowel habits or dysuria.  She states she bruises easily.      PT Comments    Patient progressing towards physical therapy goals. Ambulates up to 400 feet without an assistive device, however required min assist on 2 occasions due to loss of balance while performing dynamic gait tasks consisting of head turns. Improved during second trail later in distance without significant loss of balance. Will continue to follow. Patient will continue to benefit from skilled physical therapy services to further improve independence with functional mobility.   Follow Up Recommendations  Home health PT     Equipment Recommendations   (TBD)    Recommendations for Other Services       Precautions / Restrictions Precautions Precautions: Fall Precaution Comments: syncope Restrictions Weight Bearing Restrictions: No    Mobility  Bed Mobility Overal bed mobility: Modified Independent                Transfers Overall transfer level: Needs assistance Equipment used: None Transfers: Sit to/from Stand Sit to Stand: Supervision         General transfer comment:  Supervision for safety. No sway noted, no reported dizziness. Vitals taken sitting and standing (see below) Performed from lowest bed setting and toilet.  Ambulation/Gait Ambulation/Gait assistance: Min assist Ambulation Distance (Feet): 400 Feet Assistive device: None Gait Pattern/deviations: Step-through pattern;Staggering right;Decreased stride length;Drifts right/left Gait velocity: decreased   General Gait Details: Continues to demonstrated mildly guarded gait pattern. Min assist required on 2 occasions while pt performing dynamic gait head turn tasks as she staggered to the right side with upward and left head turns. Denies dizziness but can tell she is off balance. Practiced quick turning however pt is very guarded.   Stairs            Wheelchair Mobility    Modified Rankin (Stroke Patients Only)       Balance                                    Cognition Arousal/Alertness: Awake/alert Behavior During Therapy: WFL for tasks assessed/performed Overall Cognitive Status: Within Functional Limits for tasks assessed                      Exercises General Exercises - Lower Extremity Ankle Circles/Pumps: AROM;Both;10 reps;Seated    General Comments        Pertinent Vitals/Pain Pain Assessment: No/denies pain  Sitting BP 138/49 HR 68  Standing BP 122/58 HR 78  No reported symptoms    Home Living  Prior Function            PT Goals (current goals can now be found in the care plan section) Acute Rehab PT Goals Patient Stated Goal: Go home PT Goal Formulation: With patient Time For Goal Achievement: 01/25/14 Potential to Achieve Goals: Good Progress towards PT goals: Progressing toward goals    Frequency  Min 3X/week    PT Plan Discharge plan needs to be updated    Co-evaluation             End of Session   Activity Tolerance: Patient tolerated treatment well Patient left: in chair;with  call bell/phone within reach     Time: 1125-1149 PT Time Calculation (min) (ACUTE ONLY): 24 min  Charges:  $Gait Training: 8-22 mins $Therapeutic Activity: 8-22 mins                    G Codes:      Nicole Mercado 08-Feb-2014, 12:15 PM Nicole Mercado, [place]

## 2014-01-13 NOTE — Progress Notes (Signed)
   Subjective: No CP  NO dizziness  No SOB   Objective: Filed Vitals:   01/12/14 0900 01/12/14 1340 01/12/14 1953 01/13/14 0626  BP: 121/32 132/46 149/49 108/89  Pulse:  78 86 89  Temp:  97.7 F (36.5 C) 98.6 F (37 C) 98.1 F (36.7 C)  TempSrc:  Oral Oral Oral  Resp:  18 18 18   Height:      Weight:    99 lb 10.4 oz (45.2 kg)  SpO2:  100% 99% 99%   Weight change: 1 lb 10.4 oz (0.748 kg)  Intake/Output Summary (Last 24 hours) at 01/13/14 0759 Last data filed at 01/12/14 2150  Gross per 24 hour  Intake    960 ml  Output      0 ml  Net    960 ml    General: Alert, awake, oriented x3, in no acute distress Neck:  JVP is normal Heart: Regular rate and rhythm, III/VI sysotlic murmur  rubs, gallops.  Lungs: Clear to auscultation.  No rales or wheezes. Exemities:  No edema.   Neuro: Grossly intact, nonfocal.  Tele:  SR Lab Results: Results for orders placed or performed during the hospital encounter of 01/11/14 (from the past 24 hour(s))  Vitamin B12     Status: Abnormal   Collection Time: 01/12/14 12:58 PM  Result Value Ref Range   Vitamin B-12 >2000 (H) 211 - 911 pg/mL  Basic metabolic panel     Status: Abnormal   Collection Time: 01/13/14  4:08 AM  Result Value Ref Range   Sodium 138 135 - 145 mmol/L   Potassium 3.6 3.5 - 5.1 mmol/L   Chloride 109 96 - 112 mEq/L   CO2 24 19 - 32 mmol/L   Glucose, Bld 105 (H) 70 - 99 mg/dL   BUN 17 6 - 23 mg/dL   Creatinine, Ser 1.06 0.50 - 1.10 mg/dL   Calcium 9.3 8.4 - 10.5 mg/dL   GFR calc non Af Amer 51 (L) >90 mL/min   GFR calc Af Amer 59 (L) >90 mL/min   Anion gap 5 5 - 15  CBC     Status: Abnormal   Collection Time: 01/13/14  4:08 AM  Result Value Ref Range   WBC 22.1 (H) 4.0 - 10.5 K/uL   RBC 2.71 (L) 3.87 - 5.11 MIL/uL   Hemoglobin 8.7 (L) 12.0 - 15.0 g/dL   HCT 25.8 (L) 36.0 - 46.0 %   MCV 95.2 78.0 - 100.0 fL   MCH 32.1 26.0 - 34.0 pg   MCHC 33.7 30.0 - 36.0 g/dL   RDW 14.4 11.5 - 15.5 %   Platelets 40 (L) 150 -  400 K/uL  Protime-INR     Status: None   Collection Time: 01/13/14  4:08 AM  Result Value Ref Range   Prothrombin Time 14.0 11.6 - 15.2 seconds   INR 1.07 0.00 - 1.49    Studies/Results: No results found.  Medications: Reviewed   @PROBHOSP @  1  Aortic stenosis  Severe/critical  Patinet with episode of syncope   Plan for R/L heart cath today to evaluate in plans for replacement    2  CLL  Appreciate input from Heme service  Discussed with Dr Christ Kick   Plt on hold prior to procedure  On prednisone  LOS: 2 days   Dorris Carnes 01/13/2014, 7:59 AM

## 2014-01-13 NOTE — H&P (View-Only) (Signed)
   Subjective: No CP  NO dizziness  No SOB   Objective: Filed Vitals:   01/12/14 0900 01/12/14 1340 01/12/14 1953 01/13/14 0626  BP: 121/32 132/46 149/49 108/89  Pulse:  78 86 89  Temp:  97.7 F (36.5 C) 98.6 F (37 C) 98.1 F (36.7 C)  TempSrc:  Oral Oral Oral  Resp:  18 18 18   Height:      Weight:    99 lb 10.4 oz (45.2 kg)  SpO2:  100% 99% 99%   Weight change: 1 lb 10.4 oz (0.748 kg)  Intake/Output Summary (Last 24 hours) at 01/13/14 0759 Last data filed at 01/12/14 2150  Gross per 24 hour  Intake    960 ml  Output      0 ml  Net    960 ml    General: Alert, awake, oriented x3, in no acute distress Neck:  JVP is normal Heart: Regular rate and rhythm, III/VI sysotlic murmur  rubs, gallops.  Lungs: Clear to auscultation.  No rales or wheezes. Exemities:  No edema.   Neuro: Grossly intact, nonfocal.  Tele:  SR Lab Results: Results for orders placed or performed during the hospital encounter of 01/11/14 (from the past 24 hour(s))  Vitamin B12     Status: Abnormal   Collection Time: 01/12/14 12:58 PM  Result Value Ref Range   Vitamin B-12 >2000 (H) 211 - 911 pg/mL  Basic metabolic panel     Status: Abnormal   Collection Time: 01/13/14  4:08 AM  Result Value Ref Range   Sodium 138 135 - 145 mmol/L   Potassium 3.6 3.5 - 5.1 mmol/L   Chloride 109 96 - 112 mEq/L   CO2 24 19 - 32 mmol/L   Glucose, Bld 105 (H) 70 - 99 mg/dL   BUN 17 6 - 23 mg/dL   Creatinine, Ser 1.06 0.50 - 1.10 mg/dL   Calcium 9.3 8.4 - 10.5 mg/dL   GFR calc non Af Amer 51 (L) >90 mL/min   GFR calc Af Amer 59 (L) >90 mL/min   Anion gap 5 5 - 15  CBC     Status: Abnormal   Collection Time: 01/13/14  4:08 AM  Result Value Ref Range   WBC 22.1 (H) 4.0 - 10.5 K/uL   RBC 2.71 (L) 3.87 - 5.11 MIL/uL   Hemoglobin 8.7 (L) 12.0 - 15.0 g/dL   HCT 25.8 (L) 36.0 - 46.0 %   MCV 95.2 78.0 - 100.0 fL   MCH 32.1 26.0 - 34.0 pg   MCHC 33.7 30.0 - 36.0 g/dL   RDW 14.4 11.5 - 15.5 %   Platelets 40 (L) 150 -  400 K/uL  Protime-INR     Status: None   Collection Time: 01/13/14  4:08 AM  Result Value Ref Range   Prothrombin Time 14.0 11.6 - 15.2 seconds   INR 1.07 0.00 - 1.49    Studies/Results: No results found.  Medications: Reviewed   @PROBHOSP @  1  Aortic stenosis  Severe/critical  Patinet with episode of syncope   Plan for R/L heart cath today to evaluate in plans for replacement    2  CLL  Appreciate input from Heme service  Discussed with Dr Christ Kick   Plt on hold prior to procedure  On prednisone  LOS: 2 days   Dorris Carnes 01/13/2014, 7:59 AM

## 2014-01-13 NOTE — CV Procedure (Signed)
       PROCEDURE:  Right and  Left heart catheterization with selective coronary angiography. Right radial access with ultrasound guidance.  INDICATIONS:  Severe aortic stenosis  The risks, benefits, and details of the procedure were explained to the patient.  The patient verbalized understanding and wanted to proceed.  Informed written consent was obtained.  PROCEDURE TECHNIQUE:  After Xylocaine anesthesia, a 5 French brachial sheath was exchanged for a previously placed left antecubital IV. After Xylocaine anesthesia a 62F slender sheath was placed in the right radial artery with an anterior needle wall stick using ultrasound guidance.   IV Heparin was given.  Right coronary angiography was done using a Judkins R4 guide catheter.  Left coronary angiography was done using a Judkins L3.5 guide catheter.  Left ventriculography was done using a pigtail catheter.  A TR band was used for hemostasis.   CONTRAST:  Total of 80 cc.  COMPLICATIONS:  None.    HEMODYNAMICS:  Aortic pressure was 146/62; LV pressure was not assessed; LVEDP not assessed.  There was no gradient between the left ventricle and aorta.  Right atrial pressure 8 over 5, mean right atrial pressure 5 mmHg; right ventricular pressure 41 over 4, RVEDP 7 mmHg; pulmonary artery pressure 36/12, mean pulmonary artery pressure 23 mmHg; pulmonary capillary wedge pressure 20/22, mean pulmonary capillary wedge pressure 15 mmHg; aortic saturation 95%, pulmonary artery saturation 61%, cardiac couple 4.5 L/m, cardiac index 3.3  ANGIOGRAPHIC DATA:   The left main coronary artery is patent proximally. There is a 70% distal left main stenosis. This extends into the ostium of the LAD.   The left anterior descending artery is is a large vessel which wraps around the apex. In the mid vessel, there is a 40-50% stenosis. The first diagonal has a proximal 80% stenosis. The second diagonal has a proximal 70% stenosis..  The left circumflex artery is a large  vessel. There is a long first obtuse marginal with an ostial to proximal 70% stenosis. The second obtuse marginal is medium sized and patent. The remainder of the circumflex is patent.  The right coronary artery is a large dominant vessel with a downward takeoff. There is mild disease in the proximal to mid vessel. The posterior descending artery is large and widely patent. The posterior lateral artery is large and widely patent.  LEFT VENTRICULOGRAM:  Left ventricular angiogram was not done.  LVEDP was not assessed.  IMPRESSIONS:  1. Significant distal left main coronary artery disease. 2. Significant ostial left anterior descending artery disease. Significant disease in the first and second diagonal branches. 3. Patent ostial left circumflex artery.  Significant disease in the proximal high OM1 branch. 4. Mild disease in the coronary artery. 5. Left ventricular systolic function not assessed.    RECOMMENDATIONS:  Patient with known severe aortic stenosis. She also has significant left coronary artery disease involving the distal left main, LAD, diagonal branches and obtuse marginal branch. She will need bypass surgery at the time of her aortic valve replacement.    Patient received platelet transfusion prior to procedure.

## 2014-01-13 NOTE — Progress Notes (Signed)
Hematoma developed at left brachial site and Cardiology was paged at 2000. Manual pressure was applied for 10 minutes.   VSS.   Will continue to monitor.   Earlie Lou

## 2014-01-13 NOTE — Progress Notes (Signed)
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 Fibrinogen level is normal at 309. 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 Recent Carotid Dopplers completed in 08/2013 as ordered by [facility]. Will not reorder.  Family Communication: Pt at beside Disposition Plan: Home when medically stable      Procedures/Studies: Ct Head Wo Contrast  01/11/2014   CLINICAL DATA:  Fall in shower this morning with dizziness  EXAM: CT HEAD WITHOUT CONTRAST  CT CERVICAL SPINE WITHOUT CONTRAST  TECHNIQUE: Multidetector CT imaging of the head and cervical spine was performed following the standard protocol without intravenous contrast. Multiplanar CT image reconstructions of the cervical spine were also generated.  COMPARISON:  None  FINDINGS: CT HEAD FINDINGS  The bony calvarium is intact. A scalp hematoma and  soft tissue laceration are noted in the right posterior parietal region. Mild atrophic changes are noted. Mild chronic white matter ischemic change is seen. No findings to suggest acute hemorrhage, acute infarction or space-occupying mass lesion are noted.  CT CERVICAL SPINE FINDINGS  Seven cervical segments are well visualized. Vertebral body height is well maintained. Multilevel osteophytic changes are noted as well as facet hypertrophic changes. The hypertrophic facet changes are predominately noted on the left. Calcifications of the carotid and vertebral arteries are noted. No acute fracture or acute facet abnormality is seen.  IMPRESSION: CT of the head: Soft tissue injury in the right parietal region without acute intracranial abnormality.  CT of the cervical spine: Multilevel degenerative changes without acute abnormality. The facet hypertrophic changes are predominantly on the left.   Electronically Signed   By: Inez Catalina M.D.   On: 01/11/2014 10:08   Ct Cervical Spine Wo Contrast  01/11/2014   CLINICAL DATA:  Fall in shower this morning with dizziness  EXAM: CT HEAD WITHOUT CONTRAST  CT CERVICAL SPINE WITHOUT CONTRAST  TECHNIQUE: Multidetector CT imaging of the head and cervical spine was performed following the standard protocol without intravenous contrast. Multiplanar CT image reconstructions of the cervical spine were also generated.  COMPARISON:  None  FINDINGS: CT HEAD FINDINGS  The bony calvarium is intact. A scalp hematoma and soft tissue laceration are noted in the right posterior parietal region. Mild atrophic changes are noted. Mild chronic white matter ischemic change is seen. No findings to suggest acute hemorrhage, acute infarction or space-occupying mass lesion are noted.  CT CERVICAL SPINE FINDINGS  Seven cervical segments are well visualized. Vertebral body height is well maintained. Multilevel osteophytic changes are noted as well as facet hypertrophic changes. The hypertrophic facet  changes are predominately noted on the left. Calcifications of the carotid and vertebral arteries  are noted. No acute fracture or acute facet abnormality is seen.  IMPRESSION: CT of the head: Soft tissue injury in the right parietal region without acute intracranial abnormality.  CT of the cervical spine: Multilevel degenerative changes without acute abnormality. The facet hypertrophic changes are predominantly on the left.   Electronically Signed   By: Inez Catalina M.D.   On: 01/11/2014 10:08   Dg Chest Port 1 View  01/11/2014   CLINICAL DATA:  Syncopal episode.  Fall.  EXAM: PORTABLE CHEST - 1 VIEW  COMPARISON:  03/21/2011.  FINDINGS: Cardiopericardial silhouette within normal limits. Stable prominent costochondral calcification at the RIGHT first rib end. Monitoring leads project over the chest. No airspace disease. No pleural effusion. No displaced rib fracture or pneumothorax in this patient with a fall. Aortic arch atherosclerosis.  IMPRESSION: No active cardiopulmonary disease and no interval change.   Electronically Signed   By: Dereck Ligas M.D.   On: 01/11/2014 09:05         Subjective: Patient denies fevers, chills, headache, chest pain, dyspnea, nausea, vomiting, diarrhea, abdominal pain, dysuria, hematuria   Objective: Filed Vitals:   01/13/14 1402 01/13/14 1417 01/13/14 1443 01/13/14 1625  BP: 137/57 144/49 155/58   Pulse: 72 61 73 87  Temp: 98.1 F (36.7 C) 98 F (36.7 C) 98 F (36.7 C)   TempSrc: Oral  Oral   Resp: 20  20   Height:      Weight:      SpO2: 99% 100% 100%     Intake/Output Summary (Last 24 hours) at 01/13/14 1758 Last data filed at 01/13/14 1443  Gross per 24 hour  Intake    881 ml  Output      0 ml  Net    881 ml   Weight change: 0.748 kg (1 lb 10.4 oz) Exam:   General:  Pt is alert, follows commands appropriately, not in acute distress  HEENT: No icterus, No thrush, No neck mass, [place]/AT  Cardiovascular: RRR, V6/H2,0/9 systolic murmur  radiating to the carotids  Respiratory: CTA bilaterally, no wheezing, no crackles, no rhonchi  Abdomen: Soft/+BS, non tender, non distended, no guarding  Extremities: No edema, No lymphangitis, No petechiae, No rashes, no synovitis  Data Reviewed: Basic Metabolic Panel:  Recent Labs Lab 01/11/14 0826 01/12/14 0619 01/13/14 0408  NA 140 139 138  K 4.8 4.0 3.6  CL 106 110 109  CO2 26 24 24   GLUCOSE 112* 110* 105*  BUN 21 21 17   CREATININE 1.23* 1.15* 1.06  CALCIUM 10.1 9.3 9.3   Liver Function Tests:  Recent Labs Lab 01/12/14 0619  AST 23  ALT 13  ALKPHOS 59  BILITOT 0.6  PROT 5.1*  ALBUMIN 3.1*   No results for input(s): LIPASE, AMYLASE in the last 168 hours. No results for input(s): AMMONIA in the last 168 hours. CBC:  Recent Labs Lab 01/11/14 0826 01/12/14 0619 01/13/14 0408  WBC 14.8* 17.5* 22.1*  NEUTROABS 2.5  --   --   HGB 11.0* 9.1* 8.7*  HCT 32.7* 27.0* 25.8*  MCV 94.5 94.1 95.2  PLT 44* 36* 40*   Cardiac Enzymes:  Recent Labs Lab 01/11/14 1857 01/12/14 0619  TROPONINI <0.03 0.03   BNP: Invalid input(s): POCBNP CBG: No results for input(s): GLUCAP in the last 168 hours.  Recent Results (from the past 240 hour(s))  Urine culture     Status: None   Collection Time: 01/11/14  9:57 AM  Result Value Ref Range Status  Specimen Description URINE, CLEAN CATCH  Final   Special Requests NONE  Final   Colony Count   Final    >=100,000 COLONIES/ML Performed at [facility]    Culture   Final    ESCHERICHIA COLI Performed at [facility]    Report Status 01/13/2014 FINAL  Final   Organism ID, Bacteria ESCHERICHIA COLI  Final      Susceptibility   Escherichia coli - MIC*    AMPICILLIN 8 SENSITIVE Sensitive     CEFAZOLIN <=4 SENSITIVE Sensitive     CEFTRIAXONE <=1 SENSITIVE Sensitive     CIPROFLOXACIN <=0.25 SENSITIVE Sensitive     GENTAMICIN <=1 SENSITIVE Sensitive     LEVOFLOXACIN <=0.12 SENSITIVE Sensitive      NITROFURANTOIN <=16 SENSITIVE Sensitive     TOBRAMYCIN <=1 SENSITIVE Sensitive     TRIMETH/SULFA <=20 SENSITIVE Sensitive     PIP/TAZO <=4 SENSITIVE Sensitive     * ESCHERICHIA COLI  Culture, blood (routine x 2)     Status: None (Preliminary result)   Collection Time: 01/11/14  6:46 PM  Result Value Ref Range Status   Specimen Description BLOOD RIGHT ARM  Final   Special Requests BOTTLES DRAWN AEROBIC AND ANAEROBIC 10CC  Final   Culture   Final           BLOOD CULTURE RECEIVED NO GROWTH TO DATE CULTURE WILL BE HELD FOR 5 DAYS BEFORE ISSUING A FINAL NEGATIVE REPORT Performed at [facility]    Report Status PENDING  Incomplete  Culture, blood (routine x 2)     Status: None (Preliminary result)   Collection Time: 01/11/14  6:57 PM  Result Value Ref Range Status   Specimen Description BLOOD RIGHT UPPER WRIST  Final   Special Requests BOTTLES DRAWN AEROBIC AND ANAEROBIC 10CC  Final   Culture   Final           BLOOD CULTURE RECEIVED NO GROWTH TO DATE CULTURE WILL BE HELD FOR 5 DAYS BEFORE ISSUING A FINAL NEGATIVE REPORT Performed at [facility]    Report Status PENDING  Incomplete     Scheduled Meds: . sodium chloride   Intravenous Once  . cefTRIAXone (ROCEPHIN)  IV  1 g Intravenous Q24H  . folic acid  1 mg Oral Daily  . metoprolol tartrate  25 mg Oral BID  . predniSONE  60 mg Oral Q breakfast  . sodium chloride  3 mL Intravenous Q12H  . sodium chloride  3 mL Intravenous Q12H   Continuous Infusions: . sodium chloride 10 mL/hr at 01/11/14 1400     Shakevia Sarris, DO  [facility] Pager [phone]  If 7PM-7AM, please contact night-coverage [email or web address] Password [email or web address] 01/13/2014, 5:58 PM   LOS: 2 days

## 2014-01-13 NOTE — Progress Notes (Signed)
Site area: lt ac venous sheath Site Prior to Removal:  Level 0  Pressure Applied For: 10 minutes Manual:   yes Patient Status During Pull:  stable Post Pull Site:  Level 0 Post Pull Instructions Given:  yes Post Pull Pulses Present: left radial palpable Dressing Applied:  tegaderm Bedrest begins @ NA Comments: no complications

## 2014-01-13 NOTE — Progress Notes (Signed)
Nicole Nicole   DOB:1940-08-16   AV#:[id number]   PVV#:[id number]  Patient Care Team: Andria Frames, MD as PCP - General (Family Medicine) Andria Frames, MD (Family Medicine) Rebecca Eaton, MD (Cardiology)  Patient is in cath lab, not seen by me Subjective: Patient seen and examined. No new events overnight. Denies fever, chills, night sweats. Denies worsening headaches. No vision changes. Denies nausea, vomiting or new syncopal episodes. Denies chest pain or palpitations. Denies respiratory complaints. Denies abdominal pain. No bleeding issues reported. Alert and conversant.  Scheduled Meds: . cefTRIAXone (ROCEPHIN)  IV  1 g Intravenous Q24H  . folic acid  1 mg Oral Daily  . metoprolol tartrate  25 mg Oral BID  . predniSONE  60 mg Oral Q breakfast  . sodium chloride  3 mL Intravenous Q12H  . sodium chloride  3 mL Intravenous Q12H   Continuous Infusions: . sodium chloride 10 mL/hr at 01/11/14 1400   PRN Meds:sodium chloride, acetaminophen **OR** acetaminophen, acetaminophen, alum & mag hydroxide-simeth, ondansetron **OR** ondansetron (ZOFRAN) IV, oxyCODONE, polyethylene glycol, sodium chloride   Objective:  Filed Vitals:   01/13/14 0626  BP: 108/89  Pulse: 89  Temp: 98.1 F (36.7 C)  Resp: 18      Intake/Output Summary (Last 24 hours) at 01/13/14 0944 Last data filed at 01/13/14 0842  Gross per 24 hour  Intake    720 ml  Output      0 ml  Net    720 ml    GENERAL:alert, no distress and comfortable SKIN: skin color, texture, turgor are normal, no rashes or significant lesions. Left leg bruising and right parietal soft tissue injury due to fall. Head wrapped due to laceration in the right parietal area EYES: normal, conjunctiva are pink and non-injected, sclera clear OROPHARYNX:no exudate, no erythema and lips, buccal mucosa, and tongue normal  NECK: supple, thyroid normal size, non-tender, without nodularity LYMPH: Palpable lymphadenopathy in the neck. None  elsewhere.  LUNGS: clear to auscultation and percussion with normal breathing effort HEART: regular rate & rhythm with loud 3/6 systolic murmurs and no lower extremity edema.  ABDOMEN:abdomen soft, non-tender and normal bowel sounds. No splenomegaly Musculoskeletal:no cyanosis of digits and no clubbing PSYCH: alert & oriented x 3 with fluent speech NEURO: no focal motor/sensory deficits    CBG (last 3)  No results for input(s): GLUCAP in the last 72 hours.   Labs:   Recent Labs Lab 01/11/14 0826 01/12/14 0619 01/13/14 0408  WBC 14.8* 17.5* 22.1*  HGB 11.0* 9.1* 8.7*  HCT 32.7* 27.0* 25.8*  PLT 44* 36* 40*  MCV 94.5 94.1 95.2  MCH 31.8 31.7 32.1  MCHC 33.6 33.7 33.7  RDW 14.3 14.4 14.4  LYMPHSABS 11.2*  --   --   MONOABS 1.0  --   --   EOSABS 0.1  --   --   BASOSABS 0.0  --   --      Chemistries:    Recent Labs Lab 01/11/14 0826 01/12/14 0619 01/13/14 0408  NA 140 139 138  K 4.8 4.0 3.6  CL 106 110 109  CO2 26 24 24   GLUCOSE 112* 110* 105*  BUN 21 21 17   CREATININE 1.23* 1.15* 1.06  CALCIUM 10.1 9.3 9.3  AST  --  23  --   ALT  --  13  --   ALKPHOS  --  59  --   BILITOT  --  0.6  --     GFR Estimated Creatinine  Clearance: 33.7 mL/min (by C-G formula based on Cr of 1.06).  Liver Function Tests:  Recent Labs Lab 01/12/14 0619  AST 23  ALT 13  ALKPHOS 59  BILITOT 0.6  PROT 5.1*  ALBUMIN 3.1*    Urine Studies     Component Value Date/Time   COLORURINE YELLOW 01/11/2014 0957   APPEARANCEUR HAZY* 01/11/2014 0957   LABSPEC 1.012 01/11/2014 0957   PHURINE 7.0 01/11/2014 0957   GLUCOSEU NEGATIVE 01/11/2014 0957   HGBUR TRACE* 01/11/2014 0957   BILIRUBINUR NEGATIVE 01/11/2014 0957   KETONESUR NEGATIVE 01/11/2014 0957   PROTEINUR NEGATIVE 01/11/2014 0957   UROBILINOGEN 0.2 01/11/2014 0957   NITRITE POSITIVE* 01/11/2014 0957   LEUKOCYTESUR SMALL* 01/11/2014 0957    Coagulation profile  Recent Labs Lab 01/13/14 0408  INR 1.07     Cardiac Enzymes:  Recent Labs Lab 01/11/14 1857 01/12/14 0619  TROPONINI <0.03 0.03    Recent Labs  01/11/14 1846  TSH 0.705   Assessment/Plan: 74 y.o.   Chronic Lymphcoytic Leukemia (CLL)  On observation only  Admission WBC was 14,800, now increased to 17.5 after steroid initiation This is not very high. Clinically, she is at RAI stage IV. She does not need urgent treatment for this.  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 Additional workup was ordered including liver function tests is normal. Serum vitamin B-12 is greater than 2000. Fibrinogen level is normal at 309. She was started on vitamin L-54 and folic acid on 1/6. Empiric treatment with prednisone was initiated today, 1/7 for possible ITP which is a low risk treatment. Please continue The rationale of this was discussed with the patient and she agreed to proceed. If cardiac catheterization needed, patient can receive 1 unit of platelets prior, to ideally increase count to 50,000.  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  On observation only  Syncopal Episode Valvular heart disease In the setting of irregular heart beat, orthostatic hypotension andsevere aortic stenosis 2 D echo remarkable for critical aortic, bicuspid and aortic valve stenosis, EF 60-65 percent, Grade 1 Diastolic dysfunction . Possible cardiac catheterization via radial approach to minimize bleeding issues prior to valve replacement surgery Appreciate cardiology follow up Thoracic Surgery to see patient today  She will need heart catheterization prior to  valve replacement surgery. Recommend prophylactic platelet transfusion before heart catheterization.  DVT prophylaxis On mechanical devices  Renal insufficiency Chronic, baseline 1.2 Normalized with IV hydration  UTI Urinalysis positive for nitrite, cultures pending Rocephin IV being discontinued after cultures returned negative   Full Code Other medical issues as per admitting team   I will return next week. If urgent issues arise, please contact hematologist on call  **Disclaimer: This note was dictated with voice recognition software. Similar sounding words can inadvertently be transcribed and this note may contain transcription errors which may not have been corrected upon publication of note.Sharene Butters E, PA-C 01/13/2014  9:44 AM Nicole Overall, MD 01/13/2014

## 2014-01-13 NOTE — Progress Notes (Signed)
2330 TR band removed. VSS. Radial pulse +2. Pressure dressing applied.   Will continue to monitor closely.   Earlie Lou

## 2014-01-13 NOTE — Progress Notes (Signed)
Pt back from cath lab in bed vs stable. Pt has TR band to rt radial site looks bruised,monitoring will continue. Left brachial site bleeding, applied pressure for 2min, reinforced dressing monitoring will continue.

## 2014-01-13 NOTE — Progress Notes (Signed)
Pt rt ac bruise and tender to touch saline lock #20 in left ac and #22 placed in rt forearm.

## 2014-01-13 NOTE — Progress Notes (Addendum)
Pt left brachial site(level 1) swollen reapplied pressure to site for 52min. Rt radial TR band site has no bleeding. Bruising and swelling noted (level 1) below the TR band. Will leave air in for 30 min and will reassess. Pt denies pain. Paged on call cardiologist, awaiting a call back.

## 2014-01-13 NOTE — Interval H&P Note (Signed)
History and Physical Interval Note:  01/13/2014 4:42 PM  Nicole Mercado  has presented today for surgery, with the diagnosis of cp  The various methods of treatment have been discussed with the patient and family. After consideration of risks, benefits and other options for treatment, the patient has consented to  Procedure(s): LEFT AND RIGHT HEART CATHETERIZATION WITH CORONARY ANGIOGRAM ([name]) as a surgical intervention .  The patient's history has been reviewed, patient examined, no change in status, stable for surgery.  I have reviewed the patient's chart and labs.  Questions were answered to the patient's satisfaction.    Diagnostic cath for severe AS.   Nicole Mercado S.

## 2014-01-14 DIAGNOSIS — I350 Nonrheumatic aortic (valve) stenosis: Secondary | ICD-10-CM

## 2014-01-14 DIAGNOSIS — I25110 Atherosclerotic heart disease of native coronary artery with unstable angina pectoris: Secondary | ICD-10-CM

## 2014-01-14 LAB — CBC
HCT: 24.7 % — ABNORMAL LOW (ref 36.0–46.0)
HEMOGLOBIN: 8.2 g/dL — AB (ref 12.0–15.0)
MCH: 32 pg (ref 26.0–34.0)
MCHC: 33.2 g/dL (ref 30.0–36.0)
MCV: 96.5 fL (ref 78.0–100.0)
Platelets: 99 10*3/uL — ABNORMAL LOW (ref 150–400)
RBC: 2.56 MIL/uL — AB (ref 3.87–5.11)
RDW: 14.7 % (ref 11.5–15.5)
WBC: 20.3 10*3/uL — ABNORMAL HIGH (ref 4.0–10.5)

## 2014-01-14 LAB — BASIC METABOLIC PANEL
Anion gap: 10 (ref 5–15)
BUN: 17 mg/dL (ref 6–23)
CALCIUM: 9 mg/dL (ref 8.4–10.5)
CO2: 21 mmol/L (ref 19–32)
Chloride: 107 mEq/L (ref 96–112)
Creatinine, Ser: 1.05 mg/dL (ref 0.50–1.10)
GFR calc Af Amer: 60 mL/min — ABNORMAL LOW (ref >90)
GFR, EST NON AFRICAN AMERICAN: 51 mL/min — AB (ref >90)
Glucose, Bld: 95 mg/dL (ref 70–99)
Potassium: 4 mmol/L (ref 3.5–5.1)
SODIUM: 138 mmol/L (ref 135–145)

## 2014-01-14 LAB — PREPARE PLATELET PHERESIS: Unit division: 0

## 2014-01-14 NOTE — Progress Notes (Signed)
Primary cardiologist: Dr. Sherren Mocha  Seen for followup: Aortic stenosis and CAD  Subjective:    In bed side chair. No chest pain or breathlessness at rest.  Objective:   Temp:  [98 F (36.7 C)-98.1 F (36.7 C)] 98.1 F (36.7 C) (01/09 0610) Pulse Rate:  [61-87] 81 (01/09 0610) Resp:  [13-20] 18 (01/09 0610) BP: (108-193)/(39-94) 155/62 mmHg (01/09 0610) SpO2:  [95 %-100 %] 99 % (01/09 0610) Weight:  [99 lb 12.8 oz (45.269 kg)] 99 lb 12.8 oz (45.269 kg) (01/09 0615) Last BM Date: 01/12/14  Filed Weights   01/11/14 0839 01/13/14 0626 01/14/14 0615  Weight: 98 lb (44.453 kg) 99 lb 10.4 oz (45.2 kg) 99 lb 12.8 oz (45.269 kg)    Intake/Output Summary (Last 24 hours) at 01/14/14 1320 Last data filed at 01/14/14 0823  Gross per 24 hour  Intake    641 ml  Output      0 ml  Net    641 ml    Telemetry: Sinus with frequent PACs.  Exam:  General: Appears comfortable at rest.  Lungs: Clear with diminished breath sounds.  Cardiac: Irregular, 3/6 systolic murmur.  Abdomen: NABS.  Extremities: No pitting edema.   Lab Results:  Basic Metabolic Panel:  Recent Labs Lab 01/12/14 0619 01/13/14 0408 01/14/14 0316  NA 139 138 138  K 4.0 3.6 4.0  CL 110 109 107  CO2 24 24 21   GLUCOSE 110* 105* 95  BUN 21 17 17   CREATININE 1.15* 1.06 1.05  CALCIUM 9.3 9.3 9.0    Liver Function Tests:  Recent Labs Lab 01/12/14 0619  AST 23  ALT 13  ALKPHOS 59  BILITOT 0.6  PROT 5.1*  ALBUMIN 3.1*    CBC:  Recent Labs Lab 01/12/14 0619 01/13/14 0408 01/14/14 0316  WBC 17.5* 22.1* 20.3*  HGB 9.1* 8.7* 8.2*  HCT 27.0* 25.8* 24.7*  MCV 94.1 95.2 96.5  PLT 36* 40* 99*    Cardiac Enzymes:  Recent Labs Lab 01/11/14 1857 01/12/14 0619  TROPONINI <0.03 0.03    Echocardiogram 01/11/14: Study Conclusions  - Left ventricle: The cavity size was normal. There was mild concentric hypertrophy. Systolic function was normal. The estimated ejection fraction  was in the range of 60% to 65%. Wall motion was normal; there were no regional wall motion abnormalities. Doppler parameters are consistent with abnormal left ventricular relaxation (grade 1 diastolic dysfunction). - Aortic valve: Possibly bicuspid; moderately thickened, moderately calcified leaflets. There was severe stenosis. There was trivial regurgitation. Valve area (VTI): 0.57 cm^2. Valve area (Vmax): 0.51 cm^2. Valve area (Vmean): 0.45 cm^2. - Mitral valve: Mild, late systolicprolapse, involving the posterior leaflet. There was mild regurgitation. - Pulmonary arteries: PA peak pressure: 33 mm Hg (S).   Medications:   Scheduled Medications: . sodium chloride   Intravenous Once  . cefTRIAXone (ROCEPHIN)  IV  1 g Intravenous Q24H  . folic acid  1 mg Oral Daily  . metoprolol tartrate  25 mg Oral BID  . predniSONE  60 mg Oral Q breakfast  . sodium chloride  3 mL Intravenous Q12H     Infusions: . sodium chloride 10 mL/hr at 01/11/14 1400     PRN Medications:  acetaminophen, alum & mag hydroxide-simeth, ondansetron (ZOFRAN) IV, ondansetron **OR** [DISCONTINUED] ondansetron (ZOFRAN) IV, oxyCODONE, polyethylene glycol   Assessment:   1. Severe aortic stenosis.  2. CAD including 70% distal left main stenosis extending into the ostial LAD, 80% first diagonal stenosis, 70% second diagonal  stenosis, and 70% obtuse marginal stenosis. LVEF 60-65%.   3. 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  4. Essential hypertension.  5. Syncope with positive orthostatics, patient has been hydrated.  6. 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 Carotid Dopplers from August 2015 showed less than 40% bilateral ICA stenoses.   Plan/Discussion:    Continue beta blocker. Not on aspirin for now with thrombocytopenia, currently on steroids. TCTS currently evaluating patient to discuss surgical management of aortic stenosis and CAD.   Satira Sark, M.D., F.A.C.C.

## 2014-01-14 NOTE — Consult Note (Signed)
Nicole Mercado       Nicole Mercado,Nicole Mercado             [phone]      Cardiothoracic Surgery Consultation   Reason for Consult: Severe aortic stenosis, Left main and 2-vessel coronary artery disease Referring Physician: Dr. Dorris Carnes  Nicole Mercado is an 74 y.o. female.  HPI:   The patient has a several year history of chronic lymphocytic leukemia and chronic thrombocytopenia, hypertension, and severe aortic stenosis that was followed by Dr. Dan Maker prior to his retirement. An echocardiogram obtained in November 2013 showed a trileaflet aortic valve with severe calcification and severe aortic stenosis, EF was greater than 55%. She had a repeat echocardiogram in May 2014 which showed EF 03-49%, grade 1 diastolic dysfunction, aortic valve area 0.49, mild MR, PA peak pressure 41 mmHg. She was seen by Dr. Servando Snare for consideration of surgery but refused at that time and was thinking about having surgery in [place] where her daughter-in-law works as a Marine scientist. She has been very active at home and live independently. She is able to does her own cooking, cleaning, and shopping without any difficulty. She denies prior history of chest pain with or without exertion. She denies prior history of dizziness, headache, presyncope or syncope before October. In October she reports an episode of dizziness after a flu shot that she thought was a reaction. Since then she reports a few episodes of dizziness but has not passed out. Then in the shower on the morning of 01/11/2014  she had sudden onset of dizziness and passed out. She states she must have passed out for no more than a minute. She woke up on the floor and called her friend who contacted the EMS. On arrival to [facility], her blood pressure was 152/56. O2 saturation 100% on room air. EKG showed atrial ectopy with frequent PACs. Chest x-ray showed no acute disease. CT of the head showed soft tissue injuries in the right  periatal region without acute intracranial abnormality, multilevel degenerative changes in the spine without acute abnormality. Urinalysis shows many bacteria and positive nitrite. Urine culture showed > 100000 Ecoli that is pan-sensitive. She was placed on IV Ceftriaxone on 01/11/2014. A repeat echo on 01/11/2014 shows critical AS with a mean gradient of 61 and a peak of 104. The AVA is 0.45 cm2. The aorta is normal sized. LVEF is 60-65%. Cardiac cath yesterday shows 70% distal LM extending into the ostium of the LAD. D1 has 80% proximal stenosis and D2 has 70% proximal stenosis. The OM1 has 70% ostial stenosis. She has been seen by Heme/Onc and recommendations made.  Past Medical History  Diagnosis Date  . Thrombocytopenia 11/19/2010  . Hypertension   . 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 Arthritis   . Heart murmur   . Pancreatitis     h/o  . Carotid artery occlusion     Past Surgical History  Procedure Laterality Date  . Cesarean section      FIVE  . Cholecystectomy    . Appendectomy    . Abdominal hysterectomy    . Tonsillectomy    . Endarterectomy  06/09/2011    Procedure: ENDARTERECTOMY CAROTID;  Surgeon: Rosetta Posner, MD;  Location: [facility] OR;  Service: Vascular;  Laterality: Left;  left carotid endarterectomy with patch angioplasty  . Carotid endarterectomy  06/09/11    LEFT  cea  . Left and right  heart catheterization with coronary angiogram [name] 01/13/2014    Procedure: LEFT AND RIGHT HEART CATHETERIZATION WITH CORONARY ANGIOGRAM;  Surgeon: Jettie Booze, MD;  Location: [facility] CATH LAB;  Service: Cardiovascular;  Laterality: [name];    Family History  Problem Relation Age of Onset  . Heart disease Mother     in her 66s  . Hypertension Mother   . Heart attack Mother   . Heart disease Father   . Hypertension Father   . Hypertension Sister   . Hypertension Son     Social History:  reports that she has never smoked. She has never used smokeless tobacco. She reports that she  does not drink alcohol or use illicit drugs.  Allergies: No Known Allergies  Medications:  I have reviewed the patient's current medications. Prior to Admission:  Prescriptions prior to admission  Medication Sig Dispense Refill Last Dose  . acetaminophen (TYLENOL) 500 MG tablet Take 1,000 mg by mouth every 6 (six) hours as needed. pain    Past Week at Unknown time  . aspirin EC 81 MG tablet Take 81 mg by mouth daily.     01/10/2014 at Unknown time  . Calcium-Vitamin D 500-125 MG-UNIT TABS Take 1 tablet by mouth daily.    01/10/2014 at Unknown time  . lisinopril (PRINIVIL,ZESTRIL) 20 MG tablet Take 20 mg by mouth daily.     01/10/2014 at Unknown time  . metoprolol succinate (TOPROL-XL) 50 MG 24 hr tablet Take 50 mg by mouth daily. Take 1 1/2 tab.Take with or immediately following a meal.   01/10/2014 at 1000  . Multiple Vitamins-Minerals (MULTIVITAMINS THER. W/MINERALS) TABS Take 1 tablet by mouth daily.     01/10/2014 at Unknown time   Scheduled: . sodium chloride   Intravenous Once  . cefTRIAXone (ROCEPHIN)  IV  1 g Intravenous Q24H  . folic acid  1 mg Oral Daily  . metoprolol tartrate  25 mg Oral BID  . predniSONE  60 mg Oral Q breakfast  . sodium chloride  3 mL Intravenous Q12H   Continuous: . sodium chloride 10 mL/hr at 01/11/14 1400   PTW:[id number], alum & mag hydroxide-simeth, ondansetron (ZOFRAN) IV, ondansetron **OR** [DISCONTINUED] ondansetron (ZOFRAN) IV, oxyCODONE, polyethylene glycol Anti-infectives    Start     Dose/Rate Route Frequency Ordered Stop   01/11/14 1730  cefTRIAXone (ROCEPHIN) 1 g in dextrose 5 % 50 mL IVPB - Premix    Comments:  Please start AFTER blood cultures are drawn.   1 g100 mL/hr over 30 Minutes Intravenous Every 24 hours 01/11/14 1719        Results for orders placed or performed during the hospital encounter of 01/11/14 (from the past 48 hour(s))  Basic metabolic panel     Status: Abnormal   Collection Time: 01/13/14  4:08 AM  Result Value Ref  Range   Sodium 138 135 - 145 mmol/L    Comment: Please note change in reference range.   Potassium 3.6 3.5 - 5.1 mmol/L    Comment: Please note change in reference range.   Chloride 109 96 - 112 mEq/L   CO2 24 19 - 32 mmol/L   Glucose, Bld 105 (H) 70 - 99 mg/dL   BUN 17 6 - 23 mg/dL   Creatinine, Ser 1.06 0.50 - 1.10 mg/dL   Calcium 9.3 8.4 - 10.5 mg/dL   GFR calc non Af Amer 51 (L) >90 mL/min   GFR calc Af Amer 59 (L) >90 mL/min    Comment: (NOTE)  The eGFR has been calculated using the CKD EPI equation. This calculation has not been validated in all clinical situations. eGFR's persistently <90 mL/min signify possible Chronic Kidney Disease.    Anion gap 5 5 - 15  CBC     Status: Abnormal   Collection Time: 01/13/14  4:08 AM  Result Value Ref Range   WBC 22.1 (H) 4.0 - 10.5 K/uL   RBC 2.71 (L) 3.87 - 5.11 MIL/uL   Hemoglobin 8.7 (L) 12.0 - 15.0 g/dL   HCT 25.8 (L) 36.0 - 46.0 %   MCV 95.2 78.0 - 100.0 fL   MCH 32.1 26.0 - 34.0 pg   MCHC 33.7 30.0 - 36.0 g/dL   RDW 14.4 11.5 - 15.5 %   Platelets 40 (L) 150 - 400 K/uL    Comment: REPEATED TO VERIFY CONSISTENT WITH PREVIOUS RESULT   Protime-INR     Status: None   Collection Time: 01/13/14  4:08 AM  Result Value Ref Range   Prothrombin Time 14.0 11.6 - 15.2 seconds   INR 1.07 0.00 - 1.49  Prepare platelet pheresis     Status: None (Preliminary result)   Collection Time: 01/13/14  8:32 AM  Result Value Ref Range   Unit Number [id number]    Blood Component Type PLTPHER LR2    Unit division 00    Status of Unit ISSUED    Transfusion Status OK TO TRANSFUSE   I-STAT 3, arterial blood gas (G3+)     Status: Abnormal   Collection Time: 01/13/14  5:22 PM  Result Value Ref Range   pH, Arterial 7.428 7.350 - 7.450   pCO2 arterial 35.1 35.0 - 45.0 mmHg   pO2, Arterial 171.0 (H) 80.0 - 100.0 mmHg   Bicarbonate 23.2 20.0 - 24.0 mEq/L   TCO2 24 0 - 100 mmol/L   O2 Saturation 100.0 %   Acid-base deficit 1.0 0.0 - 2.0 mmol/L    Sample type ARTERIAL   I-STAT 3, venous blood gas (G3P V)     Status: Abnormal   Collection Time: 01/13/14  5:26 PM  Result Value Ref Range   pH, Ven 7.392 (H) 7.250 - 7.300   pCO2, Ven 38.7 (L) 45.0 - 50.0 mmHg   pO2, Ven 32.0 30.0 - 45.0 mmHg   Bicarbonate 23.6 20.0 - 24.0 mEq/L   TCO2 25 0 - 100 mmol/L   O2 Saturation 61.0 %   Acid-base deficit 1.0 0.0 - 2.0 mmol/L   Sample type VENOUS    Comment NOTIFIED PHYSICIAN   Basic metabolic panel     Status: Abnormal   Collection Time: 01/14/14  3:16 AM  Result Value Ref Range   Sodium 138 135 - 145 mmol/L    Comment: Please note change in reference range.   Potassium 4.0 3.5 - 5.1 mmol/L    Comment: Please note change in reference range.   Chloride 107 96 - 112 mEq/L   CO2 21 19 - 32 mmol/L   Glucose, Bld 95 70 - 99 mg/dL   BUN 17 6 - 23 mg/dL   Creatinine, Ser 1.05 0.50 - 1.10 mg/dL   Calcium 9.0 8.4 - 10.5 mg/dL   GFR calc non Af Amer 51 (L) >90 mL/min   GFR calc Af Amer 60 (L) >90 mL/min    Comment: (NOTE) The eGFR has been calculated using the CKD EPI equation. This calculation has not been validated in all clinical situations. eGFR's persistently <90 mL/min signify possible Chronic Kidney Disease.  Anion gap 10 5 - 15  CBC     Status: Abnormal   Collection Time: 01/14/14  3:16 AM  Result Value Ref Range   WBC 20.3 (H) 4.0 - 10.5 K/uL   RBC 2.56 (L) 3.87 - 5.11 MIL/uL   Hemoglobin 8.2 (L) 12.0 - 15.0 g/dL   HCT 24.7 (L) 36.0 - 46.0 %   MCV 96.5 78.0 - 100.0 fL   MCH 32.0 26.0 - 34.0 pg   MCHC 33.2 30.0 - 36.0 g/dL   RDW 14.7 11.5 - 15.5 %   Platelets 99 (L) 150 - 400 K/uL    Comment: REPEATED TO VERIFY CONSISTENT WITH PREVIOUS RESULT     No results found.  Review of Systems  Constitutional: Positive for malaise/fatigue. Negative for fever, chills, weight loss and diaphoresis.       Weakness in thighs with walking  HENT: Negative.   Eyes: Negative.   Respiratory: Negative for cough and shortness of breath.    Cardiovascular: Negative for chest pain, palpitations, orthopnea, leg swelling and PND.  Gastrointestinal: Negative.   Genitourinary: Negative.   Musculoskeletal: Negative.   Skin: Negative.   Neurological: Positive for dizziness and loss of consciousness.  Endo/Heme/Allergies: Bruises/bleeds easily.  Psychiatric/Behavioral: Negative.    Blood pressure 155/62, pulse 81, temperature 98.1 F (36.7 C), temperature source Oral, resp. rate 18, height 5' (1.524 m), weight 45.269 kg (99 lb 12.8 oz), SpO2 99 %. Physical Exam  Constitutional: She is oriented to person, place, and time.  Thin elderly woman in no distress  HENT:  Mouth/Throat: Oropharynx is clear and moist.  Head wrapped in bandage after fall. She had small laceration on back of head.  Eyes: EOM are normal. Pupils are equal, round, and reactive to light.  Neck: Normal range of motion. Neck supple. No JVD present. No thyromegaly present.  Cardiovascular: Normal rate and regular rhythm.   Murmur heard. Harsh 3/6 systolic murmur along RSB radiating into both sides of the neck  Respiratory: Effort normal and breath sounds normal. No respiratory distress. She has no rales.  GI: Soft. Bowel sounds are normal. She exhibits no distension and no mass. There is no tenderness.  Musculoskeletal: Normal range of motion. She exhibits no edema.  Lymphadenopathy:    She has no cervical adenopathy.  Neurological: She is alert and oriented to person, place, and time. She has normal strength. No cranial nerve deficit or sensory deficit.  Skin: Skin is warm and dry.  Multiple ecchymoses on arms and legs  Psychiatric: She has a normal mood and affect.           *[facility]*            [address] [place], [place] [address]              [phone]  ------------------------------------------------------------------- Transthoracic  Echocardiography  Patient:  Nicole Mercado, Nicole Mercado MR #:    [id number] Study Date: 01/11/2014 Gender:   F Age:    29 Height:   152.4 cm Weight:   44.5 kg BSA:    1.37 m^2 Pt. Status: Room:    2W28C  SONOGRAPHER Nicole Mercado, Nicole Mercado, Nicole Mercado ORDERING   Nicole Mercado, Nicole Mercado PERFORMING  [facility], Inpatient ATTENDING  Florencia Reasons  cc:  ------------------------------------------------------------------- LV EF: 60% -  65%  ------------------------------------------------------------------- Indications:   Syncope 780.2.  ------------------------------------------------------------------- History:  PMH:  Murmur. Aortic valve disease.  ------------------------------------------------------------------- Study Conclusions  - Left ventricle: The cavity size was normal. There was mild concentric hypertrophy. Systolic function was normal. The estimated ejection fraction was in the range of 60% to 65%. Wall motion was normal; there were no regional wall motion abnormalities. Doppler parameters are consistent with abnormal left ventricular relaxation (grade 1 diastolic dysfunction). - Aortic valve: Possibly bicuspid; moderately thickened, moderately calcified leaflets. There was severe stenosis. There was trivial regurgitation. Valve area (VTI): 0.57 cm^2. Valve area (Vmax): 0.51 cm^2. Valve area (Vmean): 0.45 cm^2. - Mitral valve: Mild, late systolicprolapse, involving the posterior leaflet. There was mild regurgitation. - Pulmonary arteries: PA peak pressure: 33 mm Hg (S).  Transthoracic echocardiography. M-mode, complete 2D, spectral Doppler, and color Doppler. Birthdate: Patient birthdate: August 07, 1940. Age: Patient is 74 yr old. Sex: Gender: female. BMI: 19.2 kg/m^2. Blood pressure:   130/50 Patient status: Inpatient. Study date: Study date: 01/11/2014. Study time: 03:37 PM. Location: Echo  laboratory.  -------------------------------------------------------------------  ------------------------------------------------------------------- Left ventricle: The cavity size was normal. There was mild concentric hypertrophy. Systolic function was normal. The estimated ejection fraction was in the range of 60% to 65%. Wall motion was normal; there were no regional wall motion abnormalities. Doppler parameters are consistent with abnormal left ventricular relaxation (grade 1 diastolic dysfunction).  ------------------------------------------------------------------- Aortic valve:  Possibly bicuspid; moderately thickened, moderately calcified leaflets. Doppler:  There was severe stenosis.  There was trivial regurgitation.  VTI ratio of LVOT to aortic valve: 0.18. Valve area (VTI): 0.57 cm^2. Indexed valve area (VTI): 0.42 cm^2/m^2. Peak velocity ratio of LVOT to aortic valve: 0.16. Valve area (Vmax): 0.51 cm^2. Indexed valve area (Vmax): 0.37 cm^2/m^2. Mean velocity ratio of LVOT to aortic valve: 0.14. Valve area (Vmean): 0.45 cm^2. Indexed valve area (Vmean): 0.33 cm^2/m^2. Mean gradient (S): 61 mm Hg. Peak gradient (S): 104 mm Hg.  ------------------------------------------------------------------- Aorta: The aorta was normal, not dilated, and non-diseased.  ------------------------------------------------------------------- Mitral valve: Leaflet separation was normal. Mild, late systolicprolapse, involving the posterior leaflet. Doppler: Transvalvular velocity was within the normal range. There was no evidence for stenosis. There was mild regurgitation.  ------------------------------------------------------------------- Left atrium: The atrium was normal in size.  ------------------------------------------------------------------- Right ventricle: The cavity size was normal. Wall thickness was normal. Systolic function was  normal.  ------------------------------------------------------------------- Pulmonic valve:  Poorly visualized. Doppler: There was no significant regurgitation.  ------------------------------------------------------------------- Tricuspid valve:  Structurally normal valve.  Leaflet separation was normal. Doppler: Transvalvular velocity was within the normal range. There was trivial regurgitation.  ------------------------------------------------------------------- Pulmonary artery:  Systolic pressure was at the upper limits of normal.  ------------------------------------------------------------------- Right atrium: The atrium was normal in size.  ------------------------------------------------------------------- Pericardium: There was no pericardial effusion.  ------------------------------------------------------------------- Post procedure conclusions Ascending Aorta:  - The aorta was normal, not dilated, and non-diseased.  ------------------------------------------------------------------- Measurements  Left ventricle              Value      Reference LV ID, ED, PLAX chordal     (L)   30.7  mm    43 - 52 LV ID, ES, PLAX chordal     (L)   20.7  mm    23 - 38 LV fx shortening, PLAX chordal      33   %    >=29 LV PW thickness, ED           13.62 mm    --------- IVS/LV PW ratio, ED           0.89      <=  1.3 Stroke volume, 2D            31   ml    --------- Stroke volume/bsa, 2D          23   ml/m^2  --------- LV e&', lateral              5.4  cm/s   --------- LV E/e&', lateral             12.52      --------- LV e&', medial              3.7  cm/s   --------- LV E/e&', medial             18.27      --------- LV e&', average              4.55  cm/s    --------- LV E/e&', average             14.86      --------- LV ejection time             310  ms    ---------  Ventricular septum            Value      Reference IVS thickness, ED            12.08 mm    ---------  LVOT                   Value      Reference LVOT ID, S                20.1  mm    --------- LVOT area                3.17  cm^2   --------- LVOT peak velocity, S          81.2  cm/s   --------- LVOT mean velocity, S          52.3  cm/s   --------- LVOT VTI, S               20.3  cm    --------- LVOT peak gradient, S          3   mm Hg  --------- Stroke volume (SV), LVOT DP       64.4  ml    --------- Stroke index (SV/bsa), LVOT DP      47   ml/m^2  ---------  Aortic valve               Value      Reference Aortic valve peak velocity, S      508.95 cm/s   --------- Aortic valve mean velocity, S      369.02 cm/s   --------- Aortic valve VTI, S           111.74 cm    --------- Aortic mean gradient, S         61   mm Hg  --------- Aortic peak gradient, S         104  mm Hg  --------- VTI ratio, LVOT/AV            0.18      --------- Aortic valve area, VTI          0.57  cm^2   --------- Aortic valve area/bsa, VTI        0.42  cm^2/m^2 --------- Velocity ratio, peak, LVOT/AV  0.16      --------- Aortic valve area, peak velocity     0.51  cm^2   --------- Aortic valve area/bsa, peak       0.37  cm^2/m^2 --------- velocity Velocity ratio, mean, LVOT/AV      0.14      --------- Aortic valve area, mean velocity     0.45  cm^2   --------- Aortic valve area/bsa, mean       0.33   cm^2/m^2 --------- velocity  Left atrium               Value      Reference LA ID, A-P, ES              28   mm    --------- LA ID/bsa, A-P              2.04  cm/m^2  <=2.2 LA volume, S               39   ml    --------- LA volume/bsa, S             28.5  ml/m^2  --------- LA volume, ES, 1-p A4C          35   ml    --------- LA volume/bsa, ES, 1-p A4C        25.5  ml/m^2  --------- LA volume, ES, 1-p A2C          44   ml    --------- LA volume/bsa, ES, 1-p A2C        32.1  ml/m^2  ---------  Mitral valve               Value      Reference Mitral E-wave peak velocity       67.6  cm/s   --------- Mitral A-wave peak velocity       109  cm/s   --------- Mitral E/A ratio, peak          0.6       ---------  Pulmonary arteries            Value      Reference PA pressure, S, DP        (H)   33   mm Hg  <=30  Tricuspid valve             Value      Reference Tricuspid regurg peak velocity      274  cm/s   --------- Tricuspid peak RV-RA gradient      30   mm Hg  ---------  Systemic veins              Value      Reference Estimated CVP              3   mm Hg  ---------  Right ventricle             Value      Reference RV pressure, S, DP        (H)   33   mm Hg  <=30 RV s&', lateral, S            12.3  cm/s   ---------  Legend: (L) and (H) mark values outside specified reference range.  ------------------------------------------------------------------- Prepared and Electronically Authenticated by  Sanda Klein, MD 2016-01-06T17:19:56     PROCEDURE: Right and Left heart catheterization with selective coronary  angiography. Right radial access with  ultrasound guidance.  INDICATIONS: Severe aortic stenosis  The risks, benefits, and details of the procedure were explained to the patient. The patient verbalized understanding and wanted to proceed. Informed written consent was obtained.  PROCEDURE TECHNIQUE: After Xylocaine anesthesia, a 5 French brachial sheath was exchanged for a previously placed left antecubital IV. After Xylocaine anesthesia a 56F slender sheath was placed in the right radial artery with an anterior needle wall stick using ultrasound guidance. IV Heparin was given. Right coronary angiography was done using a Judkins R4 guide catheter. Left coronary angiography was done using a Judkins L3.5 guide catheter. Left ventriculography was done using a pigtail catheter. A TR band was used for hemostasis.  CONTRAST: Total of 80 cc.  COMPLICATIONS: None.   HEMODYNAMICS: Aortic pressure was 146/62; LV pressure was not assessed; LVEDP not assessed. There was no gradient between the left ventricle and aorta. Right atrial pressure 8 over 5, mean right atrial pressure 5 mmHg; right ventricular pressure 41 over 4, RVEDP 7 mmHg; pulmonary artery pressure 36/12, mean pulmonary artery pressure 23 mmHg; pulmonary capillary wedge pressure 20/22, mean pulmonary capillary wedge pressure 15 mmHg; aortic saturation 95%, pulmonary artery saturation 61%, cardiac couple 4.5 L/m, cardiac index 3.3  ANGIOGRAPHIC DATA: The left main coronary artery is patent proximally. There is a 70% distal left main stenosis. This extends into the ostium of the LAD.   The left anterior descending artery is is a large vessel which wraps around the apex. In the mid vessel, there is a 40-50% stenosis. The first diagonal has a proximal 80% stenosis. The second diagonal has a proximal 70% stenosis..  The left circumflex artery is a large vessel. There is a long first obtuse marginal with an ostial to proximal 70%  stenosis. The second obtuse marginal is medium sized and patent. The remainder of the circumflex is patent.  The right coronary artery is a large dominant vessel with a downward takeoff. There is mild disease in the proximal to mid vessel. The posterior descending artery is large and widely patent. The posterior lateral artery is large and widely patent.  LEFT VENTRICULOGRAM: Left ventricular angiogram was not done. LVEDP was not assessed.  IMPRESSIONS:  1. Significant distal left main coronary artery disease. 2. Significant ostial left anterior descending artery disease. Significant disease in the first and second diagonal branches. 3. Patent ostial left circumflex artery. Significant disease in the proximal high OM1 branch. 4. Mild disease in the coronary artery. 5. Left ventricular systolic function not assessed.  RECOMMENDATIONS: Patient with known severe aortic stenosis. She also has significant left coronary artery disease involving the distal left main, LAD, diagonal branches and obtuse marginal branch. She will need bypass surgery at the time of her aortic valve replacement.   Patient received platelet transfusion prior to procedure.            Assessment/Plan:  I have personally reviewed her echo and cath films, reviewed her records and examined the patient. She has significant left main and 2-vessel coronary disease with critical aortic stenosis presenting with syncope. I agree that CABG and AVR is the best treatment for her. Her BSA is only 1.37 m2 and she may have a small annulus requiring a root replacement. Her operative risk is increased due to her CLL with chronic anemia and thrombocytopenia. I discussed the operative procedure with the patient and family including alternatives, benefits and risks; including but not limited to bleeding, blood transfusion, infection, stroke, myocardial infarction, graft failure, heart block requiring a  permanent pacemaker, organ  dysfunction, and death.  [name] understands and agrees to proceed.  We will schedule surgery for Tuesday 01/17/2014.  I spent 80 minutes performing this consultation and > 50% of this time was spent face to face counseling and coordinating the care of this patient's critical aortic stenosis and severe left main and multi-vessel coronary artery disease.  Elishia Kaczorowski K 01/14/2014, 2:00 PM

## 2014-01-14 NOTE — Progress Notes (Signed)
PROGRESS NOTE  Nicole Mercado XBM:[id number] DOB: 29-Apr-1940 DOA: 01/11/2014 PCP: Andria Frames, MD  Assessment/Plan: Syncope -orthostatics were positive but concerned about critical aortic stenosis -Patient was given IV fluids -repeat orthostatics were negative -appreciate cardiology followup -01/11/14 echocardiogram EF 02-72%, grade 1 diastolic dysfunction, severe AS, bicuspid aortic valve  Critical AS -01/13/2014 regarding catheterization--nonobstructive coronaries  -Thoracic surgery to see -01/13/14--heart cath showed 70% distal left main stenosis extending to ostial LAD; 80% first diagnoal stenosis, 70% first obtuse marginal stenosis  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 Fibrinogen level is normal at 309. 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 Recent Carotid Dopplers completed in 08/2013 as ordered by [facility]. Will not reorder.  Family Communication: Pt at beside Disposition Plan: Home when medically stable        Procedures/Studies: Ct Head Wo Contrast  01/11/2014   CLINICAL DATA:  Fall in shower this morning with dizziness  EXAM: CT HEAD WITHOUT CONTRAST  CT CERVICAL SPINE WITHOUT CONTRAST  TECHNIQUE: Multidetector CT imaging of the head and  cervical spine was performed following the standard protocol without intravenous contrast. Multiplanar CT image reconstructions of the cervical spine were also generated.  COMPARISON:  None  FINDINGS: CT HEAD FINDINGS  The bony calvarium is intact. A scalp hematoma and soft tissue laceration are noted in the right posterior parietal region. Mild atrophic changes are noted. Mild chronic white matter ischemic change is seen. No findings to suggest acute hemorrhage, acute infarction or space-occupying mass lesion are noted.  CT CERVICAL SPINE FINDINGS  Seven cervical segments are well visualized. Vertebral body height is well maintained. Multilevel osteophytic changes are noted as well as facet hypertrophic changes. The hypertrophic facet changes are predominately noted on the left. Calcifications of the carotid and vertebral arteries are noted. No acute fracture or acute facet abnormality is seen.  IMPRESSION: CT of the head: Soft tissue injury in the right parietal region without acute intracranial abnormality.  CT of the cervical spine: Multilevel degenerative changes without acute abnormality. The facet hypertrophic changes are predominantly on the left.   Electronically Signed   By: Inez Catalina M.D.   On: 01/11/2014 10:08   Ct Cervical Spine Wo Contrast  01/11/2014   CLINICAL DATA:  Fall in shower this morning with dizziness  EXAM: CT HEAD WITHOUT CONTRAST  CT CERVICAL SPINE WITHOUT CONTRAST  TECHNIQUE: Multidetector CT imaging of the head and cervical spine was performed following the standard protocol without intravenous contrast. Multiplanar CT image reconstructions of the cervical spine were also generated.  COMPARISON:  None  FINDINGS: CT HEAD FINDINGS  The bony calvarium is intact. A scalp hematoma and soft tissue laceration are noted in the right posterior parietal region. Mild atrophic changes are noted. Mild chronic white matter ischemic change is seen. No findings to suggest acute hemorrhage, acute  infarction or space-occupying mass lesion are noted.  CT CERVICAL SPINE FINDINGS  Seven cervical  segments are well visualized. Vertebral body height is well maintained. Multilevel osteophytic changes are noted as well as facet hypertrophic changes. The hypertrophic facet changes are predominately noted on the left. Calcifications of the carotid and vertebral arteries are noted. No acute fracture or acute facet abnormality is seen.  IMPRESSION: CT of the head: Soft tissue injury in the right parietal region without acute intracranial abnormality.  CT of the cervical spine: Multilevel degenerative changes without acute abnormality. The facet hypertrophic changes are predominantly on the left.   Electronically Signed   By: Inez Catalina M.D.   On: 01/11/2014 10:08   Dg Chest Port 1 View  01/11/2014   CLINICAL DATA:  Syncopal episode.  Fall.  EXAM: PORTABLE CHEST - 1 VIEW  COMPARISON:  03/21/2011.  FINDINGS: Cardiopericardial silhouette within normal limits. Stable prominent costochondral calcification at the RIGHT first rib end. Monitoring leads project over the chest. No airspace disease. No pleural effusion. No displaced rib fracture or pneumothorax in this patient with a fall. Aortic arch atherosclerosis.  IMPRESSION: No active cardiopulmonary disease and no interval change.   Electronically Signed   By: Dereck Ligas M.D.   On: 01/11/2014 09:05         Subjective: Patient denies fevers, chills, headache, chest pain, dyspnea, nausea, vomiting, diarrhea, abdominal pain, dysuria, hematuria   Objective: Filed Vitals:   01/13/14 2202 01/13/14 2230 01/14/14 0610 01/14/14 0615  BP: 108/89 112/51 155/62   Pulse: 75 74 81   Temp:   98.1 F (36.7 C)   TempSrc:   Oral   Resp: 16 16 18    Height:      Weight:    45.269 kg (99 lb 12.8 oz)  SpO2: 99% 99% 99%     Intake/Output Summary (Last 24 hours) at 01/14/14 1326 Last data filed at 01/14/14 0823  Gross per 24 hour  Intake    641 ml  Output       0 ml  Net    641 ml   Weight change: 0.069 kg (2.4 oz) Exam:   General:  Pt is alert, follows commands appropriately, not in acute distress  HEENT: No icterus, No thrush, [place]/AT  Cardiovascular: RRR, S1/S2, no rubs, no gallops  Respiratory: CTA bilaterally, no wheezing, no crackles, no rhonchi  Abdomen: Soft/+BS, non tender, non distended, no guarding  Extremities: No edema, No lymphangitis, No petechiae, No rashes, no synovitis  Data Reviewed: Basic Metabolic Panel:  Recent Labs Lab 01/11/14 0826 01/12/14 0619 01/13/14 0408 01/14/14 0316  NA 140 139 138 138  K 4.8 4.0 3.6 4.0  CL 106 110 109 107  CO2 26 24 24 21   GLUCOSE 112* 110* 105* 95  BUN 21 21 17 17   CREATININE 1.23* 1.15* 1.06 1.05  CALCIUM 10.1 9.3 9.3 9.0   Liver Function Tests:  Recent Labs Lab 01/12/14 0619  AST 23  ALT 13  ALKPHOS 59  BILITOT 0.6  PROT 5.1*  ALBUMIN 3.1*   No results for input(s): LIPASE, AMYLASE in the last 168 hours. No results for input(s): AMMONIA in the last 168 hours. CBC:  Recent Labs Lab 01/11/14 0826 01/12/14 0619 01/13/14 0408 01/14/14 0316  WBC 14.8* 17.5* 22.1* 20.3*  NEUTROABS 2.5  --   --   --   HGB 11.0* 9.1* 8.7* 8.2*  HCT 32.7* 27.0* 25.8* 24.7*  MCV 94.5 94.1 95.2 96.5  PLT 44* 36* 40* 99*   Cardiac Enzymes:  Recent Labs Lab 01/11/14 1857 01/12/14 0619  TROPONINI <  0.03 0.03   BNP: Invalid input(s): POCBNP CBG: No results for input(s): GLUCAP in the last 168 hours.  Recent Results (from the past 240 hour(s))  Urine culture     Status: None   Collection Time: 01/11/14  9:57 AM  Result Value Ref Range Status   Specimen Description URINE, CLEAN CATCH  Final   Special Requests NONE  Final   Colony Count   Final    >=100,000 COLONIES/ML Performed at [facility]    Culture   Final    ESCHERICHIA COLI Performed at [facility]    Report Status 01/13/2014 FINAL  Final   Organism ID, Bacteria ESCHERICHIA COLI  Final       Susceptibility   Escherichia coli - MIC*    AMPICILLIN 8 SENSITIVE Sensitive     CEFAZOLIN <=4 SENSITIVE Sensitive     CEFTRIAXONE <=1 SENSITIVE Sensitive     CIPROFLOXACIN <=0.25 SENSITIVE Sensitive     GENTAMICIN <=1 SENSITIVE Sensitive     LEVOFLOXACIN <=0.12 SENSITIVE Sensitive     NITROFURANTOIN <=16 SENSITIVE Sensitive     TOBRAMYCIN <=1 SENSITIVE Sensitive     TRIMETH/SULFA <=20 SENSITIVE Sensitive     PIP/TAZO <=4 SENSITIVE Sensitive     * ESCHERICHIA COLI  Culture, blood (routine x 2)     Status: None (Preliminary result)   Collection Time: 01/11/14  6:46 PM  Result Value Ref Range Status   Specimen Description BLOOD RIGHT ARM  Final   Special Requests BOTTLES DRAWN AEROBIC AND ANAEROBIC 10CC  Final   Culture   Final           BLOOD CULTURE RECEIVED NO GROWTH TO DATE CULTURE WILL BE HELD FOR 5 DAYS BEFORE ISSUING A FINAL NEGATIVE REPORT Performed at [facility]    Report Status PENDING  Incomplete  Culture, blood (routine x 2)     Status: None (Preliminary result)   Collection Time: 01/11/14  6:57 PM  Result Value Ref Range Status   Specimen Description BLOOD RIGHT UPPER WRIST  Final   Special Requests BOTTLES DRAWN AEROBIC AND ANAEROBIC 10CC  Final   Culture   Final           BLOOD CULTURE RECEIVED NO GROWTH TO DATE CULTURE WILL BE HELD FOR 5 DAYS BEFORE ISSUING A FINAL NEGATIVE REPORT Performed at [facility]    Report Status PENDING  Incomplete     Scheduled Meds: . sodium chloride   Intravenous Once  . cefTRIAXone (ROCEPHIN)  IV  1 g Intravenous Q24H  . folic acid  1 mg Oral Daily  . metoprolol tartrate  25 mg Oral BID  . predniSONE  60 mg Oral Q breakfast  . sodium chloride  3 mL Intravenous Q12H   Continuous Infusions: . sodium chloride 10 mL/hr at 01/11/14 1400     Aminah Zabawa, DO  [facility] Pager [phone]  If 7PM-7AM, please contact night-coverage [email or web address] Password [email or web address] 01/14/2014, 1:26 PM   LOS: 3 days

## 2014-01-15 NOTE — Progress Notes (Signed)
PROGRESS NOTE  Nicole Mercado WUJ:[id number] DOB: [birth year] DOA: 01/11/2014 PCP: Andria Frames, MD  Syncope -orthostatics were positive but concerned about critical aortic stenosis -Patient was given IV fluids -repeat orthostatics were negative -appreciate cardiology followup -01/11/14 echocardiogram EF 95-62%, grade 1 diastolic dysfunction, severe AS, bicuspid aortic valve  Critical AS -01/13/2014 regarding catheterization--nonobstructive coronaries  -Thoracic surgery to see -01/13/14--heart cath showed 70% distal left main stenosis extending to ostial LAD; 80% first diagnoal stenosis, 70% first obtuse marginal stenosis -appreciate Dr. 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 Fibrinogen level is normal at 309. 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 Recent Carotid Dopplers completed in 08/2013 as ordered by [facility]. Will not reorder.  Family Communication: Pt at beside Disposition Plan: Home when medically stable    Procedures/Studies: Ct Head Wo Contrast  01/11/2014   CLINICAL DATA:  Fall in shower this morning with dizziness  EXAM: CT HEAD WITHOUT CONTRAST  CT CERVICAL SPINE WITHOUT CONTRAST  TECHNIQUE:  Multidetector CT imaging of the head and cervical spine was performed following the standard protocol without intravenous contrast. Multiplanar CT image reconstructions of the cervical spine were also generated.  COMPARISON:  None  FINDINGS: CT HEAD FINDINGS  The bony calvarium is intact. A scalp hematoma and soft tissue laceration are noted in the right posterior parietal region. Mild atrophic changes are noted. Mild chronic white matter ischemic change is seen. No findings to suggest acute hemorrhage, acute infarction or space-occupying mass lesion are noted.  CT CERVICAL SPINE FINDINGS  Seven cervical segments are well visualized. Vertebral body height is well maintained. Multilevel osteophytic changes are noted as well as facet hypertrophic changes. The hypertrophic facet changes are predominately noted on the left. Calcifications of the carotid and vertebral arteries are noted. No acute fracture or acute facet abnormality is seen.  IMPRESSION: CT of the head: Soft tissue injury in the right parietal region without acute intracranial abnormality.  CT of the cervical spine: Multilevel degenerative changes without acute abnormality. The facet hypertrophic changes are predominantly on the left.   Electronically Signed   By: Inez Catalina M.D.   On: 01/11/2014 10:08   Ct Cervical Spine Wo Contrast  01/11/2014   CLINICAL DATA:  Fall in shower this morning with dizziness  EXAM: CT HEAD WITHOUT CONTRAST  CT CERVICAL SPINE WITHOUT CONTRAST  TECHNIQUE: Multidetector CT imaging of the head and cervical spine was performed following the standard protocol without intravenous contrast. Multiplanar CT image reconstructions of the cervical spine were also generated.  COMPARISON:  None  FINDINGS: CT HEAD FINDINGS  The bony calvarium is intact. A scalp hematoma and soft tissue laceration are noted in the right posterior parietal region. Mild atrophic changes are noted. Mild chronic white matter ischemic change is seen. No  findings to suggest acute hemorrhage, acute infarction or space-occupying mass lesion are noted.  CT CERVICAL  SPINE FINDINGS  Seven cervical segments are well visualized. Vertebral body height is well maintained. Multilevel osteophytic changes are noted as well as facet hypertrophic changes. The hypertrophic facet changes are predominately noted on the left. Calcifications of the carotid and vertebral arteries are noted. No acute fracture or acute facet abnormality is seen.  IMPRESSION: CT of the head: Soft tissue injury in the right parietal region without acute intracranial abnormality.  CT of the cervical spine: Multilevel degenerative changes without acute abnormality. The facet hypertrophic changes are predominantly on the left.   Electronically Signed   By: Inez Catalina M.D.   On: 01/11/2014 10:08   Dg Chest Port 1 View  01/11/2014   CLINICAL DATA:  Syncopal episode.  Fall.  EXAM: PORTABLE CHEST - 1 VIEW  COMPARISON:  03/21/2011.  FINDINGS: Cardiopericardial silhouette within normal limits. Stable prominent costochondral calcification at the RIGHT first rib end. Monitoring leads project over the chest. No airspace disease. No pleural effusion. No displaced rib fracture or pneumothorax in this patient with a fall. Aortic arch atherosclerosis.  IMPRESSION: No active cardiopulmonary disease and no interval change.   Electronically Signed   By: Dereck Ligas M.D.   On: 01/11/2014 09:05         Subjective: Patient denies fevers, chills, headache, chest pain, dyspnea, nausea, vomiting, diarrhea, abdominal pain, dysuria, hematuria   Objective: Filed Vitals:   01/14/14 1456 01/14/14 2013 01/15/14 0500 01/15/14 1523  BP: 136/51 142/57 146/61 153/69  Pulse: 75 74 73 72  Temp: 98.6 F (37 C) 98.4 F (36.9 C) 97.8 F (36.6 C) 98.6 F (37 C)  TempSrc: Oral Oral Oral Oral  Resp: 18 17 19 18   Height:      Weight:      SpO2: 98% 98% 100% 99%    Intake/Output Summary (Last 24 hours) at 01/15/14  1822 Last data filed at 01/15/14 1700  Gross per 24 hour  Intake    720 ml  Output      0 ml  Net    720 ml   Weight change:  Exam:   General:  Pt is alert, follows commands appropriately, not in acute distress  HEENT: No icterus, No thrush, [place]/AT  Cardiovascular: RRR, K4/Y1,8/5 systolic murmur LUSB  Respiratory: CTA bilaterally, no wheezing, no crackles, no rhonchi  Abdomen: Soft/+BS, non tender, non distended, no guarding  Extremities: 1+LE edema, No lymphangitis, No petechiae, No rashes, no synovitis  Data Reviewed: Basic Metabolic Panel:  Recent Labs Lab 01/11/14 0826 01/12/14 0619 01/13/14 0408 01/14/14 0316  NA 140 139 138 138  K 4.8 4.0 3.6 4.0  CL 106 110 109 107  CO2 26 24 24 21   GLUCOSE 112* 110* 105* 95  BUN 21 21 17 17   CREATININE 1.23* 1.15* 1.06 1.05  CALCIUM 10.1 9.3 9.3 9.0   Liver Function Tests:  Recent Labs Lab 01/12/14 0619  AST 23  ALT 13  ALKPHOS 59  BILITOT 0.6  PROT 5.1*  ALBUMIN 3.1*   No results for input(s): LIPASE, AMYLASE in the last 168 hours. No results for input(s): AMMONIA in the last 168 hours. CBC:  Recent Labs Lab 01/11/14 0826 01/12/14 0619 01/13/14 0408 01/14/14 0316  WBC 14.8* 17.5* 22.1* 20.3*  NEUTROABS 2.5  --   --   --   HGB 11.0* 9.1* 8.7* 8.2*  HCT 32.7* 27.0* 25.8* 24.7*  MCV 94.5 94.1 95.2 96.5  PLT 44* 36* 40* 99*   Cardiac Enzymes:  Recent Labs Lab 01/11/14  1857 01/12/14 0619  TROPONINI <0.03 0.03   BNP: Invalid input(s): POCBNP CBG: No results for input(s): GLUCAP in the last 168 hours.  Recent Results (from the past 240 hour(s))  Urine culture     Status: None   Collection Time: 01/11/14  9:57 AM  Result Value Ref Range Status   Specimen Description URINE, CLEAN CATCH  Final   Special Requests NONE  Final   Colony Count   Final    >=100,000 COLONIES/ML Performed at [facility]    Culture   Final    ESCHERICHIA COLI Performed at [facility]    Report  Status 01/13/2014 FINAL  Final   Organism ID, Bacteria ESCHERICHIA COLI  Final      Susceptibility   Escherichia coli - MIC*    AMPICILLIN 8 SENSITIVE Sensitive     CEFAZOLIN <=4 SENSITIVE Sensitive     CEFTRIAXONE <=1 SENSITIVE Sensitive     CIPROFLOXACIN <=0.25 SENSITIVE Sensitive     GENTAMICIN <=1 SENSITIVE Sensitive     LEVOFLOXACIN <=0.12 SENSITIVE Sensitive     NITROFURANTOIN <=16 SENSITIVE Sensitive     TOBRAMYCIN <=1 SENSITIVE Sensitive     TRIMETH/SULFA <=20 SENSITIVE Sensitive     PIP/TAZO <=4 SENSITIVE Sensitive     * ESCHERICHIA COLI  Culture, blood (routine x 2)     Status: None (Preliminary result)   Collection Time: 01/11/14  6:46 PM  Result Value Ref Range Status   Specimen Description BLOOD RIGHT ARM  Final   Special Requests BOTTLES DRAWN AEROBIC AND ANAEROBIC 10CC  Final   Culture   Final           BLOOD CULTURE RECEIVED NO GROWTH TO DATE CULTURE WILL BE HELD FOR 5 DAYS BEFORE ISSUING A FINAL NEGATIVE REPORT Performed at [facility]    Report Status PENDING  Incomplete  Culture, blood (routine x 2)     Status: None (Preliminary result)   Collection Time: 01/11/14  6:57 PM  Result Value Ref Range Status   Specimen Description BLOOD RIGHT UPPER WRIST  Final   Special Requests BOTTLES DRAWN AEROBIC AND ANAEROBIC 10CC  Final   Culture   Final           BLOOD CULTURE RECEIVED NO GROWTH TO DATE CULTURE WILL BE HELD FOR 5 DAYS BEFORE ISSUING A FINAL NEGATIVE REPORT Performed at [facility]    Report Status PENDING  Incomplete     Scheduled Meds: . sodium chloride   Intravenous Once  . folic acid  1 mg Oral Daily  . metoprolol tartrate  25 mg Oral BID  . predniSONE  60 mg Oral Q breakfast  . sodium chloride  3 mL Intravenous Q12H   Continuous Infusions: . sodium chloride 10 mL/hr at 01/11/14 1400     Ottis Vacha, DO  [facility] Pager [phone]  If 7PM-7AM, please contact night-coverage [email or web address] Password [email or web address] 01/15/2014,  6:22 PM   LOS: 4 days

## 2014-01-15 NOTE — Progress Notes (Signed)
Primary cardiologist: Dr. Sherren Mocha  Seen for followup: Aortic stenosis and CAD  Subjective:    No chest pain or breathlessness at rest.  Objective:   Temp:  [97.8 F (36.6 C)-98.6 F (37 C)] 97.8 F (36.6 C) (01/10 0500) Pulse Rate:  [73-75] 73 (01/10 0500) Resp:  [17-19] 19 (01/10 0500) BP: (136-146)/(51-61) 146/61 mmHg (01/10 0500) SpO2:  [98 %-100 %] 100 % (01/10 0500) Last BM Date: 01/14/14  Filed Weights   01/11/14 0839 01/13/14 0626 01/14/14 0615  Weight: 98 lb (44.453 kg) 99 lb 10.4 oz (45.2 kg) 99 lb 12.8 oz (45.269 kg)    Intake/Output Summary (Last 24 hours) at 01/15/14 1127 Last data filed at 01/14/14 1700  Gross per 24 hour  Intake    480 ml  Output      0 ml  Net    480 ml    Telemetry: Sinus with frequent PACs.  Exam:  General: Appears comfortable at rest.  Lungs: Clear with diminished breath sounds.  Cardiac: Irregular, 3/6 systolic murmur.  Abdomen: NABS.  Extremities: No pitting edema.   Lab Results:  Basic Metabolic Panel:  Recent Labs Lab 01/12/14 0619 01/13/14 0408 01/14/14 0316  NA 139 138 138  K 4.0 3.6 4.0  CL 110 109 107  CO2 24 24 21   GLUCOSE 110* 105* 95  BUN 21 17 17   CREATININE 1.15* 1.06 1.05  CALCIUM 9.3 9.3 9.0    Liver Function Tests:  Recent Labs Lab 01/12/14 0619  AST 23  ALT 13  ALKPHOS 59  BILITOT 0.6  PROT 5.1*  ALBUMIN 3.1*    CBC:  Recent Labs Lab 01/12/14 0619 01/13/14 0408 01/14/14 0316  WBC 17.5* 22.1* 20.3*  HGB 9.1* 8.7* 8.2*  HCT 27.0* 25.8* 24.7*  MCV 94.1 95.2 96.5  PLT 36* 40* 99*    Cardiac Enzymes:  Recent Labs Lab 01/11/14 1857 01/12/14 0619  TROPONINI <0.03 0.03    Echocardiogram 01/11/14: Study Conclusions  - Left ventricle: The cavity size was normal. There was mild concentric hypertrophy. Systolic function was normal. The estimated ejection fraction was in the range of 60% to 65%. Wall motion was normal; there were no regional wall  motion abnormalities. Doppler parameters are consistent with abnormal left ventricular relaxation (grade 1 diastolic dysfunction). - Aortic valve: Possibly bicuspid; moderately thickened, moderately calcified leaflets. There was severe stenosis. There was trivial regurgitation. Valve area (VTI): 0.57 cm^2. Valve area (Vmax): 0.51 cm^2. Valve area (Vmean): 0.45 cm^2. - Mitral valve: Mild, late systolicprolapse, involving the posterior leaflet. There was mild regurgitation. - Pulmonary arteries: PA peak pressure: 33 mm Hg (S).   Medications:   Scheduled Medications: . sodium chloride   Intravenous Once  . cefTRIAXone (ROCEPHIN)  IV  1 g Intravenous Q24H  . folic acid  1 mg Oral Daily  . metoprolol tartrate  25 mg Oral BID  . predniSONE  60 mg Oral Q breakfast  . sodium chloride  3 mL Intravenous Q12H    Infusions: . sodium chloride 10 mL/hr at 01/11/14 1400    PRN Medications: acetaminophen, alum & mag hydroxide-simeth, ondansetron (ZOFRAN) IV, ondansetron **OR** [DISCONTINUED] ondansetron (ZOFRAN) IV, oxyCODONE, polyethylene glycol   Assessment:   1. Severe aortic stenosis.  2. CAD including 70% distal left main stenosis extending into the ostial LAD, 80% first diagonal stenosis, 70% second diagonal stenosis, and 70% obtuse marginal stenosis. LVEF 60-65%.   3. 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  4.  Essential hypertension.  5. Syncope with positive orthostatics, patient has been hydrated.  6. 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 Carotid Dopplers from August 2015 showed less than 40% bilateral ICA stenoses.   Plan/Discussion:    Continue beta blocker. Not on aspirin for now with thrombocytopenia, currently on steroids. Dr. Cyndia Bent has evaluated the patient with plans for CABG and AVR on Tuesday.  Satira Sark, M.D., F.A.C.C.

## 2014-01-16 ENCOUNTER — Other Ambulatory Visit: Payer: Self-pay | Admitting: *Deleted

## 2014-01-16 ENCOUNTER — Inpatient Hospital Stay (HOSPITAL_COMMUNITY): Payer: Medicare Other

## 2014-01-16 DIAGNOSIS — N289 Disorder of kidney and ureter, unspecified: Secondary | ICD-10-CM

## 2014-01-16 DIAGNOSIS — A498 Other bacterial infections of unspecified site: Secondary | ICD-10-CM

## 2014-01-16 DIAGNOSIS — I2510 Atherosclerotic heart disease of native coronary artery without angina pectoris: Secondary | ICD-10-CM | POA: Diagnosis present

## 2014-01-16 DIAGNOSIS — I6523 Occlusion and stenosis of bilateral carotid arteries: Secondary | ICD-10-CM

## 2014-01-16 DIAGNOSIS — Z01810 Encounter for preprocedural cardiovascular examination: Secondary | ICD-10-CM

## 2014-01-16 DIAGNOSIS — I38 Endocarditis, valve unspecified: Secondary | ICD-10-CM

## 2014-01-16 DIAGNOSIS — I350 Nonrheumatic aortic (valve) stenosis: Secondary | ICD-10-CM

## 2014-01-16 LAB — PULMONARY FUNCTION TEST
DL/VA % pred: 83 %
DL/VA: 3.41 ml/min/mmHg/L
DLCO cor % pred: 70 %
DLCO cor: 12.32 ml/min/mmHg
DLCO unc % pred: 59 %
DLCO unc: 10.43 ml/min/mmHg
FEF 25-75 POST: 1.24 L/s
FEF 25-75 Pre: 1.24 L/sec
FEF2575-%Change-Post: 0 %
FEF2575-%PRED-PRE: 84 %
FEF2575-%Pred-Post: 84 %
FEV1-%Change-Post: 0 %
FEV1-%PRED-POST: 103 %
FEV1-%PRED-PRE: 103 %
FEV1-POST: 1.76 L
FEV1-Pre: 1.76 L
FEV1FVC-%Change-Post: 4 %
FEV1FVC-%Pred-Pre: 98 %
FEV6-%Change-Post: -3 %
FEV6-%Pred-Post: 104 %
FEV6-%Pred-Pre: 108 %
FEV6-Post: 2.28 L
FEV6-Pre: 2.37 L
FEV6FVC-%Change-Post: 0 %
FEV6FVC-%Pred-Post: 105 %
FEV6FVC-%Pred-Pre: 104 %
FVC-%Change-Post: -4 %
FVC-%Pred-Post: 99 %
FVC-%Pred-Pre: 103 %
FVC-PRE: 2.39 L
FVC-Post: 2.29 L
PRE FEV6/FVC RATIO: 99 %
Post FEV1/FVC ratio: 77 %
Post FEV6/FVC ratio: 100 %
Pre FEV1/FVC ratio: 74 %
RV % pred: 107 %
RV: 2.16 L
TLC % pred: 108 %
TLC: 4.69 L

## 2014-01-16 LAB — CBC
HCT: 27.7 % — ABNORMAL LOW (ref 36.0–46.0)
HEMOGLOBIN: 9.3 g/dL — AB (ref 12.0–15.0)
MCH: 33 pg (ref 26.0–34.0)
MCHC: 33.6 g/dL (ref 30.0–36.0)
MCV: 98.2 fL (ref 78.0–100.0)
PLATELETS: 83 10*3/uL — AB (ref 150–400)
RBC: 2.82 MIL/uL — ABNORMAL LOW (ref 3.87–5.11)
RDW: 14.7 % (ref 11.5–15.5)
WBC: 27.7 10*3/uL — AB (ref 4.0–10.5)

## 2014-01-16 LAB — BASIC METABOLIC PANEL
ANION GAP: 10 (ref 5–15)
BUN: 20 mg/dL (ref 6–23)
CALCIUM: 9.4 mg/dL (ref 8.4–10.5)
CHLORIDE: 107 meq/L (ref 96–112)
CO2: 21 mmol/L (ref 19–32)
Creatinine, Ser: 1.05 mg/dL (ref 0.50–1.10)
GFR calc Af Amer: 60 mL/min — ABNORMAL LOW (ref >90)
GFR calc non Af Amer: 51 mL/min — ABNORMAL LOW (ref >90)
GLUCOSE: 105 mg/dL — AB (ref 70–99)
POTASSIUM: 4 mmol/L (ref 3.5–5.1)
Sodium: 138 mmol/L (ref 135–145)

## 2014-01-16 LAB — PREPARE RBC (CROSSMATCH)

## 2014-01-16 MED ORDER — DEXTROSE 5 % IV SOLN
1.5000 g | INTRAVENOUS | Status: AC
  Administered 2014-01-17: 1.5 g via INTRAVENOUS
  Administered 2014-01-17: .75 g via INTRAVENOUS
  Filled 2014-01-16: qty 1.5

## 2014-01-16 MED ORDER — DEXMEDETOMIDINE HCL IN NACL 400 MCG/100ML IV SOLN
0.1000 ug/kg/h | INTRAVENOUS | Status: DC
  Filled 2014-01-16: qty 100

## 2014-01-16 MED ORDER — DOPAMINE-DEXTROSE 3.2-5 MG/ML-% IV SOLN
0.0000 ug/kg/min | INTRAVENOUS | Status: DC
  Filled 2014-01-16: qty 250

## 2014-01-16 MED ORDER — TEMAZEPAM 7.5 MG PO CAPS: 15.0000 mg | ORAL_CAPSULE | Freq: Once | ORAL | Status: AC | PRN

## 2014-01-16 MED ORDER — SODIUM CHLORIDE 0.9 % IV SOLN
INTRAVENOUS | Status: DC
  Filled 2014-01-16: qty 2.5

## 2014-01-16 MED ORDER — CHLORHEXIDINE GLUCONATE CLOTH 2 % EX PADS
6.0000 | MEDICATED_PAD | Freq: Once | CUTANEOUS | Status: AC
  Administered 2014-01-17: 6 via TOPICAL

## 2014-01-16 MED ORDER — DIAZEPAM 2 MG PO TABS
2.0000 mg | ORAL_TABLET | Freq: Once | ORAL | Status: AC
  Administered 2014-01-17: 2 mg via ORAL
  Filled 2014-01-16: qty 1

## 2014-01-16 MED ORDER — NITROGLYCERIN IN D5W 200-5 MCG/ML-% IV SOLN
2.0000 ug/min | INTRAVENOUS | Status: DC
  Filled 2014-01-16: qty 250

## 2014-01-16 MED ORDER — METOPROLOL TARTRATE 12.5 MG HALF TABLET
12.5000 mg | ORAL_TABLET | Freq: Once | ORAL | Status: AC
  Administered 2014-01-17: 12.5 mg via ORAL
  Filled 2014-01-16: qty 1

## 2014-01-16 MED ORDER — PHENYLEPHRINE HCL 10 MG/ML IJ SOLN
30.0000 ug/min | INTRAVENOUS | Status: DC
  Filled 2014-01-16: qty 2

## 2014-01-16 MED ORDER — MAGNESIUM SULFATE 50 % IJ SOLN
40.0000 meq | INTRAMUSCULAR | Status: DC
  Filled 2014-01-16: qty 10

## 2014-01-16 MED ORDER — SODIUM CHLORIDE 0.9 % IV SOLN
INTRAVENOUS | Status: DC
  Filled 2014-01-16: qty 30

## 2014-01-16 MED ORDER — EPINEPHRINE HCL 1 MG/ML IJ SOLN
0.0000 ug/min | INTRAVENOUS | Status: DC
  Filled 2014-01-16: qty 4

## 2014-01-16 MED ORDER — CHLORHEXIDINE GLUCONATE CLOTH 2 % EX PADS
6.0000 | MEDICATED_PAD | Freq: Once | CUTANEOUS | Status: AC
  Administered 2014-01-16: 6 via TOPICAL

## 2014-01-16 MED ORDER — PAPAVERINE HCL 30 MG/ML IJ SOLN
INTRAMUSCULAR | Status: AC
  Administered 2014-01-17: 10:00:00
  Filled 2014-01-16: qty 2.5

## 2014-01-16 MED ORDER — ALBUTEROL SULFATE (2.5 MG/3ML) 0.083% IN NEBU
2.5000 mg | INHALATION_SOLUTION | Freq: Once | RESPIRATORY_TRACT | Status: AC
  Administered 2014-01-16: 2.5 mg via RESPIRATORY_TRACT

## 2014-01-16 MED ORDER — VANCOMYCIN HCL 10 G IV SOLR
1250.0000 mg | INTRAVENOUS | Status: AC
  Administered 2014-01-17: 1250 mg via INTRAVENOUS
  Filled 2014-01-16: qty 1250

## 2014-01-16 MED ORDER — DEXTROSE 5 % IV SOLN
750.0000 mg | INTRAVENOUS | Status: DC
  Filled 2014-01-16: qty 750

## 2014-01-16 MED ORDER — SODIUM CHLORIDE 0.9 % IV SOLN
INTRAVENOUS | Status: DC
  Filled 2014-01-16: qty 40

## 2014-01-16 MED ORDER — POTASSIUM CHLORIDE 2 MEQ/ML IV SOLN
80.0000 meq | INTRAVENOUS | Status: DC
  Filled 2014-01-16: qty 40

## 2014-01-16 NOTE — Progress Notes (Signed)
PROGRESS NOTE  Nicole Mercado RWE:[id number] DOB: [birth year] DOA: 01/11/2014 PCP: Andria Frames, MD  Assessment/Plan: Syncope -orthostatics were positive but concerned about critical aortic stenosis -Patient was given IV fluids -repeat orthostatics were negative -appreciate cardiology followup -01/11/14 echocardiogram EF 61-95%, grade 1 diastolic dysfunction, severe AS, bicuspid aortic valve  Critical AS -01/13/2014 regarding catheterization--nonobstructive coronaries  -Thoracic surgery to see -01/13/14--heart cath showed 70% distal left main stenosis extending to ostial LAD; 80% first diagnoal stenosis, 70% first obtuse marginal stenosis -appreciate Dr. 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 Alvy Bimler -leukocytosis--chronic--likely related to CLL -no signs of infection, afebrile and hemodynamically stable -Baseline platelets 70-77K -01/16/14--platelets 83K -Serum vitamin B-12 is greater than 2000. Fibrinogen level is normal at 309. 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 Recent Carotid Dopplers completed in 08/2013 as ordered by [facility]. Will not reorder.  Family Communication: son updated at beside Disposition Plan: AVR and CABG 01/17/14          Procedures/Studies: Ct Head Wo Contrast  01/11/2014   CLINICAL DATA:  Fall in shower this morning with dizziness  EXAM: CT HEAD WITHOUT CONTRAST   CT CERVICAL SPINE WITHOUT CONTRAST  TECHNIQUE: Multidetector CT imaging of the head and cervical spine was performed following the standard protocol without intravenous contrast. Multiplanar CT image reconstructions of the cervical spine were also generated.  COMPARISON:  None  FINDINGS: CT HEAD FINDINGS  The bony calvarium is intact. A scalp hematoma and soft tissue laceration are noted in the right posterior parietal region. Mild atrophic changes are noted. Mild chronic white matter ischemic change is seen. No findings to suggest acute hemorrhage, acute infarction or space-occupying mass lesion are noted.  CT CERVICAL SPINE FINDINGS  Seven cervical segments are well visualized. Vertebral body height is well maintained. Multilevel osteophytic changes are noted as well as facet hypertrophic changes. The hypertrophic facet changes are predominately noted on the left. Calcifications of the carotid and vertebral arteries are noted. No acute fracture or acute facet abnormality is seen.  IMPRESSION: CT of the head: Soft tissue injury in the right parietal region without acute intracranial abnormality.  CT of the cervical spine: Multilevel degenerative changes without acute abnormality. The facet hypertrophic changes are predominantly on the left.   Electronically Signed   By: Inez Catalina M.D.   On: 01/11/2014 10:08   Ct Cervical Spine Wo Contrast  01/11/2014   CLINICAL DATA:  Fall in shower this morning with dizziness  EXAM: CT HEAD WITHOUT CONTRAST  CT CERVICAL SPINE WITHOUT CONTRAST  TECHNIQUE: Multidetector CT imaging of the head and cervical spine was performed following the standard protocol without intravenous contrast. Multiplanar CT image reconstructions of the cervical spine were also generated.  COMPARISON:  None  FINDINGS: CT HEAD FINDINGS  The bony calvarium is intact. A scalp hematoma and soft tissue laceration are noted in the right posterior parietal region. Mild atrophic changes are noted. Mild  chronic white matter ischemic change is seen. No findings to suggest acute hemorrhage, acute  infarction or space-occupying mass lesion are noted.  CT CERVICAL SPINE FINDINGS  Seven cervical segments are well visualized. Vertebral body height is well maintained. Multilevel osteophytic changes are noted as well as facet hypertrophic changes. The hypertrophic facet changes are predominately noted on the left. Calcifications of the carotid and vertebral arteries are noted. No acute fracture or acute facet abnormality is seen.  IMPRESSION: CT of the head: Soft tissue injury in the right parietal region without acute intracranial abnormality.  CT of the cervical spine: Multilevel degenerative changes without acute abnormality. The facet hypertrophic changes are predominantly on the left.   Electronically Signed   By: Inez Catalina M.D.   On: 01/11/2014 10:08   Dg Chest Port 1 View  01/11/2014   CLINICAL DATA:  Syncopal episode.  Fall.  EXAM: PORTABLE CHEST - 1 VIEW  COMPARISON:  03/21/2011.  FINDINGS: Cardiopericardial silhouette within normal limits. Stable prominent costochondral calcification at the RIGHT first rib end. Monitoring leads project over the chest. No airspace disease. No pleural effusion. No displaced rib fracture or pneumothorax in this patient with a fall. Aortic arch atherosclerosis.  IMPRESSION: No active cardiopulmonary disease and no interval change.   Electronically Signed   By: Dereck Ligas M.D.   On: 01/11/2014 09:05         Subjective: Patient denies fevers, chills, headache, chest pain, dyspnea, nausea, vomiting, diarrhea, abdominal pain, dysuria, hematuria   Objective: Filed Vitals:   01/15/14 1523 01/15/14 2005 01/16/14 0451 01/16/14 1007  BP: 153/69 159/51 148/71 153/78  Pulse: 72 81 77 87  Temp: 98.6 F (37 C) 98.9 F (37.2 C) 98.9 F (37.2 C)   TempSrc: Oral Oral Oral   Resp: 18 19 17    Height:      Weight:      SpO2: 99% 100% 99%     Intake/Output Summary  (Last 24 hours) at 01/16/14 1912 Last data filed at 01/16/14 1830  Gross per 24 hour  Intake    240 ml  Output      0 ml  Net    240 ml   Weight change:  Exam:   General:  Pt is alert, follows commands appropriately, not in acute distress  HEENT: No icterus, No thrush, [place]/AT  Cardiovascular: RRR, S1/S2, no rubs, no gallops  Respiratory: CTA bilaterally, no wheezing, no crackles, no rhonchi  Abdomen: Soft/+BS, non tender, non distended, no guarding  Extremities: 1+LE edema, No lymphangitis, No petechiae, No rashes, no synovitis  Data Reviewed: Basic Metabolic Panel:  Recent Labs Lab 01/11/14 0826 01/12/14 0619 01/13/14 0408 01/14/14 0316 01/16/14 0321  NA 140 139 138 138 138  K 4.8 4.0 3.6 4.0 4.0  CL 106 110 109 107 107  CO2 26 24 24 21 21   GLUCOSE 112* 110* 105* 95 105*  BUN 21 21 17 17 20   CREATININE 1.23* 1.15* 1.06 1.05 1.05  CALCIUM 10.1 9.3 9.3 9.0 9.4   Liver Function Tests:  Recent Labs Lab 01/12/14 0619  AST 23  ALT 13  ALKPHOS 59  BILITOT 0.6  PROT 5.1*  ALBUMIN 3.1*   No results for input(s): LIPASE, AMYLASE in the last 168 hours. No results for input(s): AMMONIA in the last 168 hours. CBC:  Recent Labs Lab 01/11/14 0826 01/12/14 0619 01/13/14 0408 01/14/14 0316 01/16/14 0321  WBC 14.8* 17.5* 22.1* 20.3* 27.7*  NEUTROABS 2.5  --   --   --   --   HGB 11.0* 9.1* 8.7* 8.2* 9.3*  HCT  32.7* 27.0* 25.8* 24.7* 27.7*  MCV 94.5 94.1 95.2 96.5 98.2  PLT 44* 36* 40* 99* 83*   Cardiac Enzymes:  Recent Labs Lab 01/11/14 1857 01/12/14 0619  TROPONINI <0.03 0.03   BNP: Invalid input(s): POCBNP CBG: No results for input(s): GLUCAP in the last 168 hours.  Recent Results (from the past 240 hour(s))  Urine culture     Status: None   Collection Time: 01/11/14  9:57 AM  Result Value Ref Range Status   Specimen Description URINE, CLEAN CATCH  Final   Special Requests NONE  Final   Colony Count   Final    >=100,000 COLONIES/ML Performed  at [facility]    Culture   Final    ESCHERICHIA COLI Performed at [facility]    Report Status 01/13/2014 FINAL  Final   Organism ID, Bacteria ESCHERICHIA COLI  Final      Susceptibility   Escherichia coli - MIC*    AMPICILLIN 8 SENSITIVE Sensitive     CEFAZOLIN <=4 SENSITIVE Sensitive     CEFTRIAXONE <=1 SENSITIVE Sensitive     CIPROFLOXACIN <=0.25 SENSITIVE Sensitive     GENTAMICIN <=1 SENSITIVE Sensitive     LEVOFLOXACIN <=0.12 SENSITIVE Sensitive     NITROFURANTOIN <=16 SENSITIVE Sensitive     TOBRAMYCIN <=1 SENSITIVE Sensitive     TRIMETH/SULFA <=20 SENSITIVE Sensitive     PIP/TAZO <=4 SENSITIVE Sensitive     * ESCHERICHIA COLI  Culture, blood (routine x 2)     Status: None (Preliminary result)   Collection Time: 01/11/14  6:46 PM  Result Value Ref Range Status   Specimen Description BLOOD RIGHT ARM  Final   Special Requests BOTTLES DRAWN AEROBIC AND ANAEROBIC 10CC  Final   Culture   Final           BLOOD CULTURE RECEIVED NO GROWTH TO DATE CULTURE WILL BE HELD FOR 5 DAYS BEFORE ISSUING A FINAL NEGATIVE REPORT Performed at [facility]    Report Status PENDING  Incomplete  Culture, blood (routine x 2)     Status: None (Preliminary result)   Collection Time: 01/11/14  6:57 PM  Result Value Ref Range Status   Specimen Description BLOOD RIGHT UPPER WRIST  Final   Special Requests BOTTLES DRAWN AEROBIC AND ANAEROBIC 10CC  Final   Culture   Final           BLOOD CULTURE RECEIVED NO GROWTH TO DATE CULTURE WILL BE HELD FOR 5 DAYS BEFORE ISSUING A FINAL NEGATIVE REPORT Performed at [facility]    Report Status PENDING  Incomplete     Scheduled Meds: . sodium chloride   Intravenous Once  . [START ON 01/17/2014] aminocaproic acid (AMICAR) for OHS   Intravenous To OR  . [START ON 01/17/2014] cefUROXime (ZINACEF)  IV  1.5 g Intravenous To OR  . [START ON 01/17/2014] cefUROXime (ZINACEF)  IV  750 mg Intravenous To OR  . [START ON 01/17/2014]  dexmedetomidine  0.1-0.7 mcg/kg/hr Intravenous To OR  . [START ON 01/17/2014] DOPamine  0-10 mcg/kg/min Intravenous To OR  . [START ON 01/17/2014] epinephrine  0-10 mcg/min Intravenous To OR  . folic acid  1 mg Oral Daily  . [START ON 01/17/2014] heparin-papaverine-plasmalyte irrigation   Irrigation To OR  . [START ON 01/17/2014] heparin 30,000 units/NS 1000 mL solution for CELLSAVER   Other To OR  . [START ON 01/17/2014] insulin (NOVOLIN-R) infusion   Intravenous To OR  . [START ON 01/17/2014] magnesium  sulfate  40 mEq Other To OR  . metoprolol tartrate  25 mg Oral BID  . [START ON 01/17/2014] nitroGLYCERIN  2-200 mcg/min Intravenous To OR  . [START ON 01/17/2014] phenylephrine (NEO-SYNEPHRINE) Adult infusion  30-200 mcg/min Intravenous To OR  . [START ON 01/17/2014] potassium chloride  80 mEq Other To OR  . predniSONE  60 mg Oral Q breakfast  . sodium chloride  3 mL Intravenous Q12H  . [START ON 01/17/2014] vancomycin  1,250 mg Intravenous To OR   Continuous Infusions: . sodium chloride 10 mL/hr at 01/11/14 1400     Andrell Bergeson, DO  [facility] Pager [phone]  If 7PM-7AM, please contact night-coverage [email or web address] Password [email or web address] 01/16/2014, 7:12 PM   LOS: 5 days

## 2014-01-16 NOTE — Progress Notes (Signed)
SUBJECTIVE:  No complaints  OBJECTIVE:   Vitals:   Filed Vitals:   01/15/14 1523 01/15/14 2005 01/16/14 0451 01/16/14 1007  BP: 153/69 159/51 148/71 153/78  Pulse: 72 81 77 87  Temp: 98.6 F (37 C) 98.9 F (37.2 C) 98.9 F (37.2 C)   TempSrc: Oral Oral Oral   Resp: 18 19 17    Height:      Weight:      SpO2: 99% 100% 99%    I&O's:   Intake/Output Summary (Last 24 hours) at 01/16/14 1042 Last data filed at 01/15/14 1700  Gross per 24 hour  Intake    480 ml  Output      0 ml  Net    480 ml   TELEMETRY: Reviewed telemetry pt in NSR:     PHYSICAL EXAM General: Well developed, well nourished, in no acute distress Head: Eyes PERRLA, No xanthomas.   Normal cephalic and atramatic  Lungs:   Clear bilaterally to auscultation and percussion. Heart:   HRRR S1 S2 Pulses are 2+ & equal. 2/6 late peaking systolic murmur at RUSB to LLSB Abdomen: Bowel sounds are positive, abdomen soft and non-tender without masses  Extremities:   No clubbing, cyanosis or edema.  DP +1 Neuro: Alert and oriented X 3. Psych:  Good affect, responds appropriately   LABS: Basic Metabolic Panel:  Recent Labs  01/14/14 0316 01/16/14 0321  NA 138 138  K 4.0 4.0  CL 107 107  CO2 21 21  GLUCOSE 95 105*  BUN 17 20  CREATININE 1.05 1.05  CALCIUM 9.0 9.4   Liver Function Tests: No results for input(s): AST, ALT, ALKPHOS, BILITOT, PROT, ALBUMIN in the last 72 hours. No results for input(s): LIPASE, AMYLASE in the last 72 hours. CBC:  Recent Labs  01/14/14 0316 01/16/14 0321  WBC 20.3* 27.7*  HGB 8.2* 9.3*  HCT 24.7* 27.7*  MCV 96.5 98.2  PLT 99* 83*   Cardiac Enzymes: No results for input(s): CKTOTAL, CKMB, CKMBINDEX, TROPONINI in the last 72 hours. BNP: Invalid input(s): POCBNP D-Dimer: No results for input(s): DDIMER in the last 72 hours. Hemoglobin A1C: No results for input(s): HGBA1C in the last 72 hours. Fasting Lipid Panel: No results for input(s): CHOL, HDL, LDLCALC, TRIG,  CHOLHDL, LDLDIRECT in the last 72 hours. Thyroid Function Tests: No results for input(s): TSH, T4TOTAL, T3FREE, THYROIDAB in the last 72 hours.  Invalid input(s): FREET3 Anemia Panel: No results for input(s): VITAMINB12, FOLATE, FERRITIN, TIBC, IRON, RETICCTPCT in the last 72 hours. Coag Panel:   Lab Results  Component Value Date   INR 1.07 01/13/2014   INR 0.99 06/06/2011   INR 1.03 11/11/2010    RADIOLOGY: Ct Head Wo Contrast  01/11/2014   CLINICAL DATA:  Fall in shower this morning with dizziness  EXAM: CT HEAD WITHOUT CONTRAST  CT CERVICAL SPINE WITHOUT CONTRAST  TECHNIQUE: Multidetector CT imaging of the head and cervical spine was performed following the standard protocol without intravenous contrast. Multiplanar CT image reconstructions of the cervical spine were also generated.  COMPARISON:  None  FINDINGS: CT HEAD FINDINGS  The bony calvarium is intact. A scalp hematoma and soft tissue laceration are noted in the right posterior parietal region. Mild atrophic changes are noted. Mild chronic white matter ischemic change is seen. No findings to suggest acute hemorrhage, acute infarction or space-occupying mass lesion are noted.  CT CERVICAL SPINE FINDINGS  Seven cervical segments are well visualized. Vertebral body height is well maintained. Multilevel osteophytic  changes are noted as well as facet hypertrophic changes. The hypertrophic facet changes are predominately noted on the left. Calcifications of the carotid and vertebral arteries are noted. No acute fracture or acute facet abnormality is seen.  IMPRESSION: CT of the head: Soft tissue injury in the right parietal region without acute intracranial abnormality.  CT of the cervical spine: Multilevel degenerative changes without acute abnormality. The facet hypertrophic changes are predominantly on the left.   Electronically Signed   By: Inez Catalina M.D.   On: 01/11/2014 10:08   Ct Cervical Spine Wo Contrast  01/11/2014   CLINICAL DATA:   Fall in shower this morning with dizziness  EXAM: CT HEAD WITHOUT CONTRAST  CT CERVICAL SPINE WITHOUT CONTRAST  TECHNIQUE: Multidetector CT imaging of the head and cervical spine was performed following the standard protocol without intravenous contrast. Multiplanar CT image reconstructions of the cervical spine were also generated.  COMPARISON:  None  FINDINGS: CT HEAD FINDINGS  The bony calvarium is intact. A scalp hematoma and soft tissue laceration are noted in the right posterior parietal region. Mild atrophic changes are noted. Mild chronic white matter ischemic change is seen. No findings to suggest acute hemorrhage, acute infarction or space-occupying mass lesion are noted.  CT CERVICAL SPINE FINDINGS  Seven cervical segments are well visualized. Vertebral body height is well maintained. Multilevel osteophytic changes are noted as well as facet hypertrophic changes. The hypertrophic facet changes are predominately noted on the left. Calcifications of the carotid and vertebral arteries are noted. No acute fracture or acute facet abnormality is seen.  IMPRESSION: CT of the head: Soft tissue injury in the right parietal region without acute intracranial abnormality.  CT of the cervical spine: Multilevel degenerative changes without acute abnormality. The facet hypertrophic changes are predominantly on the left.   Electronically Signed   By: Inez Catalina M.D.   On: 01/11/2014 10:08   Dg Chest Port 1 View  01/11/2014   CLINICAL DATA:  Syncopal episode.  Fall.  EXAM: PORTABLE CHEST - 1 VIEW  COMPARISON:  03/21/2011.  FINDINGS: Cardiopericardial silhouette within normal limits. Stable prominent costochondral calcification at the RIGHT first rib end. Monitoring leads project over the chest. No airspace disease. No pleural effusion. No displaced rib fracture or pneumothorax in this patient with a fall. Aortic arch atherosclerosis.  IMPRESSION: No active cardiopulmonary disease and no interval change.    Electronically Signed   By: Dereck Ligas M.D.   On: 01/11/2014 09:05   Assessment:   1. Severe aortic stenosis.  2. CAD including 70% distal left main stenosis extending into the ostial LAD, 80% first diagonal stenosis, 70% second diagonal stenosis, and 70% obtuse marginal stenosis. LVEF 60-65%.   3. 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  4. Essential hypertension.  5. Syncope with positive orthostatics, patient has been hydrated.  6. 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 Carotid Dopplers from August 2015 showed less than 40% bilateral ICA stenoses.   Plan/Discussion:    Continue beta blocker. Not on aspirin for now with thrombocytopenia, currently on steroids. Dr. Cyndia Bent has evaluated the patient with plans for CABG and AVR on Tuesday.   Sueanne Margarita, MD  01/16/2014  10:42 AM

## 2014-01-16 NOTE — Progress Notes (Signed)
Pre-op Cardiac Surgery  Carotid Findings:  1-39% ICA stenosis.  Left CEA is patent.  Right vertebral artery flow is antegrade but exhibits elevated velocities.  Left vertebral artery flow is antegrade.   Upper Extremity Right Left  Brachial Pressures 168T 171T  Radial Waveforms T T  Ulnar Waveforms T T  Palmar Arch (Allen's Test) WNL WNL   Findings:      Lower  Extremity Right Left  Dorsalis Pedis    Anterior Tibial    Posterior Tibial    Ankle/Brachial Indices      Findings:

## 2014-01-16 NOTE — Progress Notes (Signed)
[id number] Education completed re importance of mobility and IS after surgery. Discussed sternal precautions and showed pt how to get up and down without use of arms. Gave OHS booklet and care guide and wrote down how to watch pre op video when family arrives. Pt stated family will be available to help after discharge 24/7. Has IS. Graylon Good RN BSN 01/16/2014 3:32 PM

## 2014-01-16 NOTE — Progress Notes (Signed)
3 Days Post-Op Procedure(s) (LRB): LEFT AND RIGHT HEART CATHETERIZATION WITH CORONARY ANGIOGRAM ([name]) Subjective:  No complaints. She has walked today with no chest pain or shortness of breath.  Objective: Vital signs in last 24 hours: Temp:  [98.2 F (36.8 C)-98.9 F (37.2 C)] 98.2 F (36.8 C) (01/11 2008) Pulse Rate:  [77-87] 79 (01/11 2008) Cardiac Rhythm:  [-] Normal sinus rhythm (01/11 2111) Resp:  [17-18] 18 (01/11 2008) BP: (148-154)/(71-78) 154/76 mmHg (01/11 2008) SpO2:  [99 %] 99 % (01/11 2008)  Hemodynamic parameters for last 24 hours:    Intake/Output from previous day: 01/10 0701 - 01/11 0700 In: 720 [P.O.:720] Out: -  Intake/Output this shift:    General appearance: alert and cooperative Heart: regular rate and rhythm and systolic murmur Lungs: clear to auscultation bilaterally  Lab Results:  Recent Labs  01/14/14 0316 01/16/14 0321  WBC 20.3* 27.7*  HGB 8.2* 9.3*  HCT 24.7* 27.7*  PLT 99* 83*   BMET:  Recent Labs  01/14/14 0316 01/16/14 0321  NA 138 138  K 4.0 4.0  CL 107 107  CO2 21 21  GLUCOSE 95 105*  BUN 17 20  CREATININE 1.05 1.05  CALCIUM 9.0 9.4    PT/INR: No results for input(s): LABPROT, INR in the last 72 hours. ABG    Component Value Date/Time   PHART 7.428 01/13/2014 1722   HCO3 23.6 01/13/2014 1726   TCO2 25 01/13/2014 1726   ACIDBASEDEF 1.0 01/13/2014 1726   O2SAT 61.0 01/13/2014 1726   CBG (last 3)  No results for input(s): GLUCAP in the last 72 hours.  Assessment/Plan:  Severe aortic stenosis and multivessel coronary disease. Plan AVR and CABG tomorrow. I discussed the operative procedure with the patient and family including alternatives, benefits and risks; including but not limited to bleeding, blood transfusion, infection, stroke, myocardial infarction, graft failure, heart block requiring a permanent pacemaker, organ dysfunction, and death.  [name] understands and agrees to proceed.  LOS: 5 days     BARTLE,BRYAN K 01/16/2014

## 2014-01-16 NOTE — Progress Notes (Signed)
Physical Therapy Treatment Patient Details Name: Nicole Mercado MRN: [id number] DOB: December 20, 1940 Today's Date: 01/16/2014    History of Present Illness Nicole Mercado  is a 74 y.o. female, with a pmh of leukemia, thrombocytopenia, aortic stenosis, and carotid artery disease.  Nicole Mercado describes feeling perfectly well until Nicole Mercado was taking a shower this morning when Nicole Mercado began to feel slightly off.  The next thing Nicole Mercado recalls is waking up on the floor.  Nicole Mercado called a friend to come help and he subsequently called EMS.  Mr. Asa Saunas (the friend) reports that Nicole Mercado mentioned to him Nicole Mercado may have been leaning over washing her hair when Nicole Mercado passed out.  Nicole Mercado reports passing out once before several months ago.  Nicole Mercado has not started any new medications recently, Nicole Mercado denies vomiting, HA, double vision, chest pain, abdominal pain, changes in bowel habits or dysuria.  Nicole Mercado states Nicole Mercado bruises easily.      PT Comments    Pt ambulated 200' with no AD and min-guard A, drifting right and left with ambulation but no LOB. Discussed sternal precautions and what to expect after surgery. Treatment ended short due to pt taken down to vascular lab. Will need re-order to resume PT after surgery. Thank you!  Follow Up Recommendations  SNF     Equipment Recommendations  Other (comment) (TBD)    Recommendations for Other Services       Precautions / Restrictions Precautions Precautions: Fall Precaution Comments: syncope Restrictions Weight Bearing Restrictions: No Other Position/Activity Restrictions: will have sternal precautions after sx tomorrow so reviewed these with her     Mobility  Bed Mobility Overal bed mobility: Modified Independent             General bed mobility comments: practiced bed mobility like Nicole Mercado will have to do it with sternal precautions, pt able to do this appropriately  Transfers Overall transfer level: Needs assistance Equipment used: None Transfers: Sit to/from Stand Sit to  Stand: Supervision         General transfer comment: practiced with hands on knees. Supervision for safety, no LOB  Ambulation/Gait Ambulation/Gait assistance: Min guard Ambulation Distance (Feet): 200 Feet Assistive device: None Gait Pattern/deviations: Step-through pattern;Drifts right/left Gait velocity: WFL Gait velocity interpretation: at or above normal speed for age/gender General Gait Details: pt with driftin during gait but no LOB, no SOB or dizziness.    Stairs            Wheelchair Mobility    Modified Rankin (Stroke Patients Only)       Balance Overall balance assessment: Needs assistance;History of Falls Sitting-balance support: No upper extremity supported Sitting balance-Leahy Scale: Good     Standing balance support: No upper extremity supported Standing balance-Leahy Scale: Fair Standing balance comment: unable to accept mod challenges                    Cognition Arousal/Alertness: Awake/alert Behavior During Therapy: WFL for tasks assessed/performed Overall Cognitive Status: Within Functional Limits for tasks assessed                      Exercises      General Comments General comments (skin integrity, edema, etc.): treatment cut short as vascualr arrived to take her for testing      Pertinent Vitals/Pain Pain Assessment: No/denies pain  VSS    Home Living  Prior Function            PT Goals (current goals can now be found in the care plan section) Acute Rehab PT Goals Patient Stated Goal: Go home PT Goal Formulation: With patient Time For Goal Achievement: 01/25/14 Potential to Achieve Goals: Good Progress towards PT goals: Progressing toward goals    Frequency  Min 3X/week    PT Plan Other (comment) (will be updated after surgery)    Co-evaluation             End of Session   Activity Tolerance: Patient tolerated treatment well Patient left: in chair (w/c with  vascular)     Time: 8185-6314 PT Time Calculation (min) (ACUTE ONLY): 13 min  Charges:  $Gait Training: 8-22 mins                    G Codes:     Leighton Roach, PT  Acute Rehab Services  [phone]  Leighton Roach 01/16/2014, 3:05 PM

## 2014-01-16 NOTE — Progress Notes (Signed)
Nicole Mercado   DOB:May 15, 1940   SJ#:[id number]   TML#:[id number]  Patient Care Team: Andria Frames, MD as PCP - General (Family Medicine) Andria Frames, MD (Family Medicine) Rebecca Eaton, MD (Cardiology) I have seen the patient, examined her and edited the notes as follows  Subjective: Patient seen and examined. No new events overnight. Denies fever, chills, night sweats. Denies worsening headaches. No vision changes. Denies nausea, vomiting or new syncopal episodes. Denies chest pain or palpitations. Denies respiratory complaints. Denies abdominal pain. No bleeding issues reported. Alert and conversant.  Scheduled Meds: . sodium chloride   Intravenous Once  . folic acid  1 mg Oral Daily  . metoprolol tartrate  25 mg Oral BID  . predniSONE  60 mg Oral Q breakfast  . sodium chloride  3 mL Intravenous Q12H   Continuous Infusions: . sodium chloride 10 mL/hr at 01/11/14 1400   PRN Meds:acetaminophen, alum & mag hydroxide-simeth, ondansetron (ZOFRAN) IV, ondansetron **OR** [DISCONTINUED] ondansetron (ZOFRAN) IV, oxyCODONE, polyethylene glycol   Objective:  Filed Vitals:   01/16/14 0451  BP: 148/71  Pulse: 77  Temp: 98.9 F (37.2 C)  Resp: 17      Intake/Output Summary (Last 24 hours) at 01/16/14 0954 Last data filed at 01/15/14 1700  Gross per 24 hour  Intake    480 ml  Output      0 ml  Net    480 ml    GENERAL:alert, no distress and comfortable SKIN: skin color, texture, turgor are normal, no rashes or significant lesions. Left leg bruising and right parietal soft tissue injury due to fall. Head laceration healing well. EYES: normal, conjunctiva are pink and non-injected, sclera clear OROPHARYNX:no exudate, no erythema and lips, buccal mucosa, and tongue normal  NECK: supple, thyroid normal size, non-tender, without nodularity LYMPH: Palpable lymphadenopathy in the neck. None elsewhere.  LUNGS: clear to auscultation and percussion with normal breathing  effort HEART: regular rate & rhythm with loud 3/6 systolic murmurs and no lower extremity edema.  ABDOMEN:abdomen soft, non-tender and normal bowel sounds. No splenomegaly Musculoskeletal:no cyanosis of digits and no clubbing PSYCH: alert & oriented x 3 with fluent speech NEURO: no focal motor/sensory deficits    CBG (last 3)  No results for input(s): GLUCAP in the last 72 hours.   Labs:   Recent Labs Lab 01/11/14 0826 01/12/14 0619 01/13/14 0408 01/14/14 0316 01/16/14 0321  WBC 14.8* 17.5* 22.1* 20.3* 27.7*  HGB 11.0* 9.1* 8.7* 8.2* 9.3*  HCT 32.7* 27.0* 25.8* 24.7* 27.7*  PLT 44* 36* 40* 99* 83*  MCV 94.5 94.1 95.2 96.5 98.2  MCH 31.8 31.7 32.1 32.0 33.0  MCHC 33.6 33.7 33.7 33.2 33.6  RDW 14.3 14.4 14.4 14.7 14.7  LYMPHSABS 11.2*  --   --   --   --   MONOABS 1.0  --   --   --   --   EOSABS 0.1  --   --   --   --   BASOSABS 0.0  --   --   --   --      Chemistries:    Recent Labs Lab 01/11/14 0826 01/12/14 0619 01/13/14 0408 01/14/14 0316 01/16/14 0321  NA 140 139 138 138 138  K 4.8 4.0 3.6 4.0 4.0  CL 106 110 109 107 107  CO2 26 24 24 21 21   GLUCOSE 112* 110* 105* 95 105*  BUN 21 21 17 17 20   CREATININE 1.23* 1.15* 1.06 1.05 1.05  CALCIUM  10.1 9.3 9.3 9.0 9.4  AST  --  23  --   --   --   ALT  --  13  --   --   --   ALKPHOS  --  59  --   --   --   BILITOT  --  0.6  --   --   --     GFR Estimated Creatinine Clearance: 34.1 mL/min (by C-G formula based on Cr of 1.05).  Liver Function Tests:  Recent Labs Lab 01/12/14 0619  AST 23  ALT 13  ALKPHOS 59  BILITOT 0.6  PROT 5.1*  ALBUMIN 3.1*    Urine Studies     Component Value Date/Time   COLORURINE YELLOW 01/11/2014 0957   APPEARANCEUR HAZY* 01/11/2014 0957   LABSPEC 1.012 01/11/2014 0957   PHURINE 7.0 01/11/2014 0957   GLUCOSEU NEGATIVE 01/11/2014 0957   HGBUR TRACE* 01/11/2014 0957   BILIRUBINUR NEGATIVE 01/11/2014 0957   KETONESUR NEGATIVE 01/11/2014 0957   PROTEINUR NEGATIVE  01/11/2014 0957   UROBILINOGEN 0.2 01/11/2014 0957   NITRITE POSITIVE* 01/11/2014 0957   LEUKOCYTESUR SMALL* 01/11/2014 0957    Coagulation profile  Recent Labs Lab 01/13/14 0408  INR 1.07    Cardiac Enzymes:  Recent Labs Lab 01/11/14 1857 01/12/14 0619  TROPONINI <0.03 0.03   No results for input(s): TSH, T4TOTAL, T3FREE, THYROIDAB in the last 72 hours.  Invalid input(s): FREET3 Assessment/Plan: 74 y.o.   Chronic Lymphcoytic Leukemia (CLL)  On observation only  Admission WBC was 14,800, now increased to 17.5 after steroid initiation This is not very high. Clinically, she is at RAI stage IV. She does not need urgent treatment for this.  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 Baseline platelets are between 70-77,000 while on baby aspirin Empiric treatment with prednisone was initiated on 1/7 for possible ITP which is a low risk treatment.  She had 1 unit of platelets on 1/8 prior to cardiac catheterization for platelet count of 40,000 Current value is 80,000 . Recommend continue prednisone for now Additional workup was ordered including liver function tests is normal. Serum vitamin B-12 is greater than 2000. Fibrinogen level is normal at 309. She was started on vitamin N-82 and folic acid on 1/6.  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  On observation only  Syncopal Episode Valvular heart disease In the setting of irregular heart beat, orthostatic hypotension andsevere aortic stenosis 2 D echo remarkable for critical aortic, bicuspid and aortic valve stenosis, EF 60-65 percent, Grade 1 Diastolic dysfunction .  After platelet transfusion, she underwent cardiac catheterization via radial approach to minimize bleeding issues on 1/9, anticipating valve  replacement surgery. This showed CAD including 70% distal left main stenosis extending into the ostial LAD, 80% first diagonal stenosis, 70% second diagonal stenosis, and 70% obtuse marginal stenosis. LVEF 60-65%.  Thoracic Surgery to proceed with CABG and Aortic Valve Replacement on 01/17/14 (Dr. Cyndia Bent) No contraindication to proceed with surgery. Transfuse platelets prn. OK to give antiplatelet/anticoagulation as long as platelet count is >50,000  DVT prophylaxis On mechanical devices  Renal insufficiency Chronic, baseline 1.2 Normalized with IV hydration   E Coli UTI Urinalysis positive for nitrite, cultures positive for E Coli, treated with Rocephin   Full Code Other medical issues as per admitting team    **Disclaimer: This note was dictated with voice  recognition software. Similar sounding words can inadvertently be transcribed and this note may contain transcription errors which may not have been corrected upon publication of note.Sharene Butters E, PA-C 01/16/2014  9:54 AM Andres Vest, MD 01/16/2014

## 2014-01-17 ENCOUNTER — Inpatient Hospital Stay (HOSPITAL_COMMUNITY): Payer: Medicare Other

## 2014-01-17 ENCOUNTER — Encounter (HOSPITAL_COMMUNITY): Payer: Self-pay | Admitting: Certified Registered Nurse Anesthetist

## 2014-01-17 ENCOUNTER — Encounter (HOSPITAL_COMMUNITY)
Admission: EM | Disposition: A | Discharge status: Skilled Nursing Facility | Payer: Medicare Other | Source: Home / Self Care | Attending: Surgery

## 2014-01-17 ENCOUNTER — Inpatient Hospital Stay (HOSPITAL_COMMUNITY): Payer: Medicare Other | Admitting: Certified Registered"

## 2014-01-17 DIAGNOSIS — Z952 Presence of prosthetic heart valve: Secondary | ICD-10-CM

## 2014-01-17 DIAGNOSIS — I2510 Atherosclerotic heart disease of native coronary artery without angina pectoris: Secondary | ICD-10-CM

## 2014-01-17 DIAGNOSIS — I350 Nonrheumatic aortic (valve) stenosis: Secondary | ICD-10-CM

## 2014-01-17 HISTORY — PX: TEE WITHOUT CARDIOVERSION: SHX5443

## 2014-01-17 HISTORY — PX: AORTIC VALVE REPLACEMENT: SHX41

## 2014-01-17 HISTORY — PX: CORONARY ARTERY BYPASS GRAFT: SHX141

## 2014-01-17 LAB — MRSA PCR SCREENING: MRSA by PCR: NEGATIVE

## 2014-01-17 LAB — POCT I-STAT 3, ART BLOOD GAS (G3+)
ACID-BASE DEFICIT: 1 mmol/L (ref 0.0–2.0)
Acid-base deficit: 2 mmol/L (ref 0.0–2.0)
Acid-base deficit: 3 mmol/L — ABNORMAL HIGH (ref 0.0–2.0)
Acid-base deficit: 5 mmol/L — ABNORMAL HIGH (ref 0.0–2.0)
BICARBONATE: 22.9 meq/L (ref 20.0–24.0)
Bicarbonate: 24.7 mEq/L — ABNORMAL HIGH (ref 20.0–24.0)
Bicarbonate: 23.1 mEq/L (ref 20.0–24.0)
Bicarbonate: 19.4 mEq/L — ABNORMAL LOW (ref 20.0–24.0)
O2 SAT: 99 %
O2 Saturation: 100 %
O2 Saturation: 99 %
O2 Saturation: 94 %
PCO2 ART: 43.4 mmHg (ref 35.0–45.0)
PCO2 ART: 34.1 mmHg — AB (ref 35.0–45.0)
PH ART: 7.336 — AB (ref 7.350–7.450)
PH ART: 7.366 (ref 7.350–7.450)
PO2 ART: 136 mmHg — AB (ref 80.0–100.0)
Patient temperature: 36.7
TCO2: 26 mmol/L (ref 0–100)
TCO2: 24 mmol/L (ref 0–100)
TCO2: 24 mmol/L (ref 0–100)
TCO2: 20 mmol/L (ref 0–100)
pCO2 arterial: 51.5 mmHg — ABNORMAL HIGH (ref 35.0–45.0)
pCO2 arterial: 32.1 mmHg — ABNORMAL LOW (ref 35.0–45.0)
pH, Arterial: 7.289 — ABNORMAL LOW (ref 7.350–7.450)
pH, Arterial: 7.46 — ABNORMAL HIGH (ref 7.350–7.450)
pO2, Arterial: 351 mmHg — ABNORMAL HIGH (ref 80.0–100.0)
pO2, Arterial: 139 mmHg — ABNORMAL HIGH (ref 80.0–100.0)
pO2, Arterial: 76 mmHg — ABNORMAL LOW (ref 80.0–100.0)

## 2014-01-17 LAB — POCT I-STAT, CHEM 8
BUN: 20 mg/dL (ref 6–23)
BUN: 18 mg/dL (ref 6–23)
BUN: 18 mg/dL (ref 6–23)
BUN: 17 mg/dL (ref 6–23)
BUN: 17 mg/dL (ref 6–23)
BUN: 18 mg/dL (ref 6–23)
BUN: 18 mg/dL (ref 6–23)
CALCIUM ION: 1.12 mmol/L — AB (ref 1.13–1.30)
CALCIUM ION: 1.08 mmol/L — AB (ref 1.13–1.30)
CHLORIDE: 102 meq/L (ref 96–112)
CHLORIDE: 97 meq/L (ref 96–112)
CREATININE: 0.8 mg/dL (ref 0.50–1.10)
CREATININE: 0.8 mg/dL (ref 0.50–1.10)
Calcium, Ion: 1.32 mmol/L — ABNORMAL HIGH (ref 1.13–1.30)
Calcium, Ion: 1.04 mmol/L — ABNORMAL LOW (ref 1.13–1.30)
Calcium, Ion: 1.31 mmol/L — ABNORMAL HIGH (ref 1.13–1.30)
Calcium, Ion: 0.95 mmol/L — ABNORMAL LOW (ref 1.13–1.30)
Calcium, Ion: 1.18 mmol/L (ref 1.13–1.30)
Chloride: 103 mEq/L (ref 96–112)
Chloride: 99 mEq/L (ref 96–112)
Chloride: 101 mEq/L (ref 96–112)
Chloride: 101 mEq/L (ref 96–112)
Chloride: 107 mEq/L (ref 96–112)
Creatinine, Ser: 0.8 mg/dL (ref 0.50–1.10)
Creatinine, Ser: 0.7 mg/dL (ref 0.50–1.10)
Creatinine, Ser: 0.7 mg/dL (ref 0.50–1.10)
Creatinine, Ser: 0.8 mg/dL (ref 0.50–1.10)
Creatinine, Ser: 1 mg/dL (ref 0.50–1.10)
GLUCOSE: 108 mg/dL — AB (ref 70–99)
GLUCOSE: 147 mg/dL — AB (ref 70–99)
GLUCOSE: 97 mg/dL (ref 70–99)
Glucose, Bld: 113 mg/dL — ABNORMAL HIGH (ref 70–99)
Glucose, Bld: 122 mg/dL — ABNORMAL HIGH (ref 70–99)
Glucose, Bld: 121 mg/dL — ABNORMAL HIGH (ref 70–99)
Glucose, Bld: 177 mg/dL — ABNORMAL HIGH (ref 70–99)
HCT: 19 % — ABNORMAL LOW (ref 36.0–46.0)
HCT: 21 % — ABNORMAL LOW (ref 36.0–46.0)
HCT: 25 % — ABNORMAL LOW (ref 36.0–46.0)
HCT: 26 % — ABNORMAL LOW (ref 36.0–46.0)
HCT: 27 % — ABNORMAL LOW (ref 36.0–46.0)
HEMATOCRIT: 23 % — AB (ref 36.0–46.0)
HEMATOCRIT: 23 % — AB (ref 36.0–46.0)
HEMOGLOBIN: 7.8 g/dL — AB (ref 12.0–15.0)
HEMOGLOBIN: 8.8 g/dL — AB (ref 12.0–15.0)
Hemoglobin: 6.5 g/dL — CL (ref 12.0–15.0)
Hemoglobin: 7.8 g/dL — ABNORMAL LOW (ref 12.0–15.0)
Hemoglobin: 7.1 g/dL — ABNORMAL LOW (ref 12.0–15.0)
Hemoglobin: 8.5 g/dL — ABNORMAL LOW (ref 12.0–15.0)
Hemoglobin: 9.2 g/dL — ABNORMAL LOW (ref 12.0–15.0)
POTASSIUM: 3.5 mmol/L (ref 3.5–5.1)
Potassium: 3.3 mmol/L — ABNORMAL LOW (ref 3.5–5.1)
Potassium: 3.2 mmol/L — ABNORMAL LOW (ref 3.5–5.1)
Potassium: 3.8 mmol/L (ref 3.5–5.1)
Potassium: 4 mmol/L (ref 3.5–5.1)
Potassium: 4.3 mmol/L (ref 3.5–5.1)
Potassium: 5.6 mmol/L — ABNORMAL HIGH (ref 3.5–5.1)
SODIUM: 137 mmol/L (ref 135–145)
SODIUM: 136 mmol/L (ref 135–145)
SODIUM: 136 mmol/L (ref 135–145)
Sodium: 140 mmol/L (ref 135–145)
Sodium: 138 mmol/L (ref 135–145)
Sodium: 139 mmol/L (ref 135–145)
Sodium: 137 mmol/L (ref 135–145)
TCO2: 24 mmol/L (ref 0–100)
TCO2: 21 mmol/L (ref 0–100)
TCO2: 25 mmol/L (ref 0–100)
TCO2: 27 mmol/L (ref 0–100)
TCO2: 21 mmol/L (ref 0–100)
TCO2: 21 mmol/L (ref 0–100)
TCO2: 18 mmol/L (ref 0–100)

## 2014-01-17 LAB — BASIC METABOLIC PANEL
Anion gap: 5 (ref 5–15)
BUN: 21 mg/dL (ref 6–23)
CALCIUM: 9.2 mg/dL (ref 8.4–10.5)
CHLORIDE: 105 meq/L (ref 96–112)
CO2: 26 mmol/L (ref 19–32)
Creatinine, Ser: 1.03 mg/dL (ref 0.50–1.10)
GFR calc Af Amer: 61 mL/min — ABNORMAL LOW (ref >90)
GFR calc non Af Amer: 53 mL/min — ABNORMAL LOW (ref >90)
GLUCOSE: 96 mg/dL (ref 70–99)
Potassium: 4.1 mmol/L (ref 3.5–5.1)
Sodium: 136 mmol/L (ref 135–145)

## 2014-01-17 LAB — CBC
HEMATOCRIT: 29.7 % — AB (ref 36.0–46.0)
HEMATOCRIT: 26.6 % — AB (ref 36.0–46.0)
HEMATOCRIT: 29.3 % — AB (ref 36.0–46.0)
HEMOGLOBIN: 9.2 g/dL — AB (ref 12.0–15.0)
Hemoglobin: 9.8 g/dL — ABNORMAL LOW (ref 12.0–15.0)
Hemoglobin: 10.1 g/dL — ABNORMAL LOW (ref 12.0–15.0)
MCH: 32.6 pg (ref 26.0–34.0)
MCH: 30.9 pg (ref 26.0–34.0)
MCH: 30.7 pg (ref 26.0–34.0)
MCHC: 33 g/dL (ref 30.0–36.0)
MCHC: 34.6 g/dL (ref 30.0–36.0)
MCHC: 34.5 g/dL (ref 30.0–36.0)
MCV: 98.7 fL (ref 78.0–100.0)
MCV: 89.3 fL (ref 78.0–100.0)
MCV: 89.1 fL (ref 78.0–100.0)
Platelets: 88 10*3/uL — ABNORMAL LOW (ref 150–400)
Platelets: 110 10*3/uL — ABNORMAL LOW (ref 150–400)
Platelets: 142 10*3/uL — ABNORMAL LOW (ref 150–400)
RBC: 3.01 MIL/uL — ABNORMAL LOW (ref 3.87–5.11)
RBC: 2.98 MIL/uL — ABNORMAL LOW (ref 3.87–5.11)
RBC: 3.29 MIL/uL — ABNORMAL LOW (ref 3.87–5.11)
RDW: 15 % (ref 11.5–15.5)
RDW: 16 % — AB (ref 11.5–15.5)
RDW: 16.7 % — ABNORMAL HIGH (ref 11.5–15.5)
WBC: 39 10*3/uL — AB (ref 4.0–10.5)
WBC: 31.1 10*3/uL — ABNORMAL HIGH (ref 4.0–10.5)
WBC: 10.1 10*3/uL (ref 4.0–10.5)

## 2014-01-17 LAB — HEMOGLOBIN AND HEMATOCRIT, BLOOD
HCT: 26 % — ABNORMAL LOW (ref 36.0–46.0)
Hemoglobin: 8.9 g/dL — ABNORMAL LOW (ref 12.0–15.0)

## 2014-01-17 LAB — MAGNESIUM: Magnesium: 3.6 mg/dL — ABNORMAL HIGH (ref 1.5–2.5)

## 2014-01-17 LAB — POCT I-STAT 4, (NA,K, GLUC, HGB,HCT)
Glucose, Bld: 83 mg/dL (ref 70–99)
HCT: 28 % — ABNORMAL LOW (ref 36.0–46.0)
Hemoglobin: 9.5 g/dL — ABNORMAL LOW (ref 12.0–15.0)
POTASSIUM: 3.5 mmol/L (ref 3.5–5.1)
Sodium: 140 mmol/L (ref 135–145)

## 2014-01-17 LAB — PROTIME-INR
INR: 1.52 — ABNORMAL HIGH (ref 0.00–1.49)
Prothrombin Time: 18.5 seconds — ABNORMAL HIGH (ref 11.6–15.2)

## 2014-01-17 LAB — GLUCOSE, CAPILLARY
GLUCOSE-CAPILLARY: 103 mg/dL — AB (ref 70–99)
GLUCOSE-CAPILLARY: 139 mg/dL — AB (ref 70–99)
Glucose-Capillary: 108 mg/dL — ABNORMAL HIGH (ref 70–99)
Glucose-Capillary: 112 mg/dL — ABNORMAL HIGH (ref 70–99)
Glucose-Capillary: 125 mg/dL — ABNORMAL HIGH (ref 70–99)

## 2014-01-17 LAB — APTT: aPTT: 38 seconds — ABNORMAL HIGH (ref 24–37)

## 2014-01-17 LAB — PREPARE RBC (CROSSMATCH)

## 2014-01-17 LAB — CREATININE, SERUM
Creatinine, Ser: 1.03 mg/dL (ref 0.50–1.10)
GFR calc Af Amer: 61 mL/min — ABNORMAL LOW (ref >90)
GFR calc non Af Amer: 53 mL/min — ABNORMAL LOW (ref >90)

## 2014-01-17 LAB — PLATELET COUNT: Platelets: 41 10*3/uL — ABNORMAL LOW (ref 150–400)

## 2014-01-17 LAB — POTASSIUM: POTASSIUM: 5.6 mmol/L — AB (ref 3.5–5.1)

## 2014-01-17 IMAGING — CR DG CHEST 1V PORT
1 series · 1 of 1 positions shown · non-contrast
Comparison: [DATE]

CLINICAL DATA: Post aortic valve replacement.

EXAM:
PORTABLE CHEST - 1 VIEW

[AP]
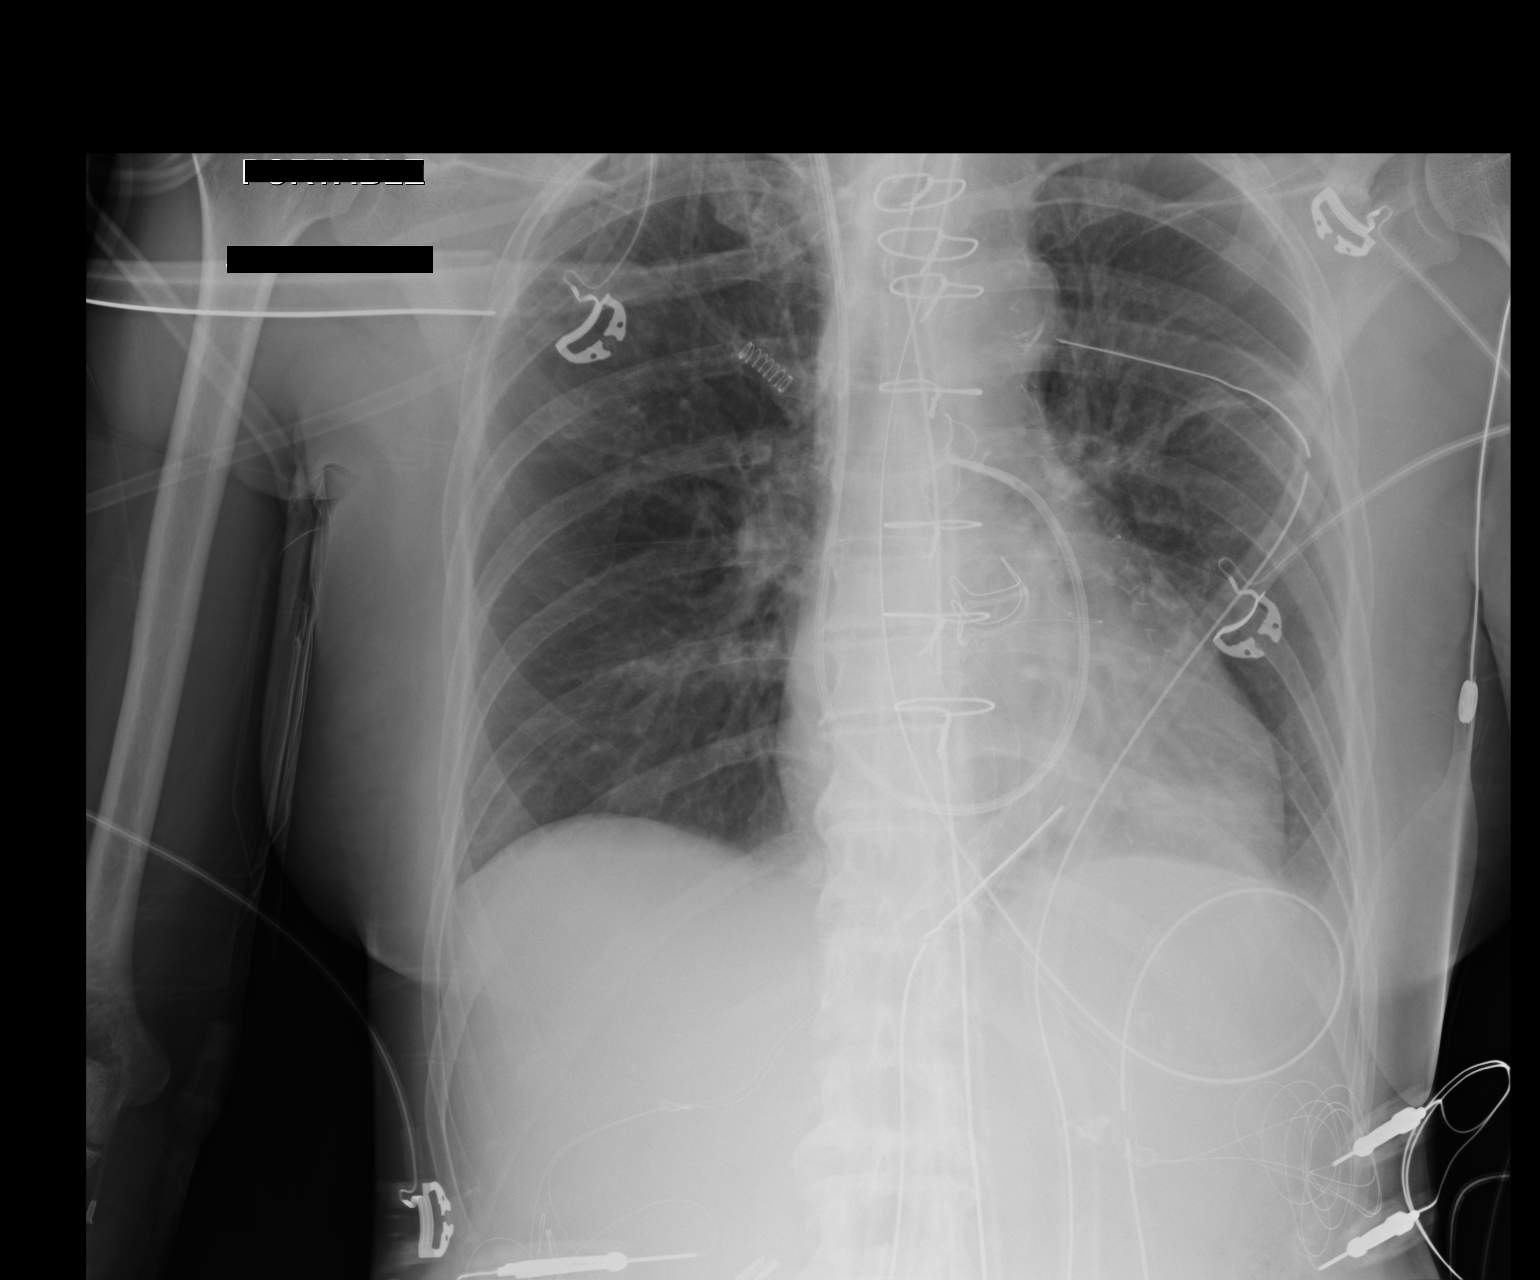

[1 of 1 positions shown; findings below may reference images not displayed]

FINDINGS: Changes of aortic valve replacement. Endotracheal tube is
approximately 4 cm above the carina. Left chest tube is in place.
Tiny left apical pneumothorax suspected. Swan-Ganz catheter tip is
in the main pulmonary artery. NG tube is in the stomach.

Heart is normal size.  Minimal left base atelectasis.  No effusions.
IMPRESSION: Postoperative changes. Tiny left apical pneumothorax. Support
devices in expected position.

## 2014-01-17 SURGERY — CORONARY ARTERY BYPASS GRAFTING (CABG)
Anesthesia: General | Site: Mouth

## 2014-01-17 MED ORDER — ONDANSETRON HCL 4 MG/2ML IJ SOLN
4.0000 mg | Freq: Four times a day (QID) | INTRAMUSCULAR | Status: DC | PRN
Start: 2014-01-17 — End: 2014-01-20
  Administered 2014-01-18: 4 mg via INTRAVENOUS
  Filled 2014-01-17: qty 2

## 2014-01-17 MED ORDER — METOPROLOL TARTRATE 12.5 MG HALF TABLET
12.5000 mg | ORAL_TABLET | Freq: Two times a day (BID) | ORAL | Status: DC
  Filled 2014-01-17 (×3): qty 1

## 2014-01-17 MED ORDER — NITROGLYCERIN IN D5W 200-5 MCG/ML-% IV SOLN
INTRAVENOUS | Status: DC | PRN
  Administered 2014-01-17: 5 ug/min via INTRAVENOUS

## 2014-01-17 MED ORDER — SODIUM CHLORIDE 0.9 % IJ SOLN
3.0000 mL | Freq: Two times a day (BID) | INTRAMUSCULAR | Status: DC
  Administered 2014-01-18 – 2014-01-19 (×3): 3 mL via INTRAVENOUS

## 2014-01-17 MED ORDER — SODIUM CHLORIDE 0.9 % IV SOLN: Freq: Once | INTRAVENOUS | Status: DC

## 2014-01-17 MED ORDER — LACTATED RINGERS IV SOLN
INTRAVENOUS | Status: DC | PRN
  Administered 2014-01-17: 07:00:00 via INTRAVENOUS

## 2014-01-17 MED ORDER — ACETAMINOPHEN 500 MG PO TABS
1000.0000 mg | ORAL_TABLET | Freq: Four times a day (QID) | ORAL | Status: DC
  Administered 2014-01-17 – 2014-01-19 (×7): 1000 mg via ORAL
  Filled 2014-01-17 (×9): qty 2

## 2014-01-17 MED ORDER — NITROGLYCERIN IN D5W 200-5 MCG/ML-% IV SOLN: 0.0000 ug/min | INTRAVENOUS | Status: DC

## 2014-01-17 MED ORDER — ROCURONIUM BROMIDE 100 MG/10ML IV SOLN
INTRAVENOUS | Status: DC | PRN
  Administered 2014-01-17: 40 mg via INTRAVENOUS
  Administered 2014-01-17: 20 mg via INTRAVENOUS
  Administered 2014-01-17: 60 mg via INTRAVENOUS

## 2014-01-17 MED ORDER — HEPARIN SODIUM (PORCINE) 1000 UNIT/ML IJ SOLN
INTRAMUSCULAR | Status: DC | PRN
  Administered 2014-01-17: 15 mL via INTRAVENOUS

## 2014-01-17 MED ORDER — FENTANYL CITRATE 0.05 MG/ML IJ SOLN
INTRAMUSCULAR | Status: DC | PRN
  Administered 2014-01-17: 200 ug via INTRAVENOUS
  Administered 2014-01-17: 150 ug via INTRAVENOUS
  Administered 2014-01-17 (×2): 100 ug via INTRAVENOUS
  Administered 2014-01-17: 250 ug via INTRAVENOUS
  Administered 2014-01-17: 50 ug via INTRAVENOUS
  Administered 2014-01-17: 250 ug via INTRAVENOUS
  Administered 2014-01-17: 50 ug via INTRAVENOUS
  Administered 2014-01-17: 100 ug via INTRAVENOUS

## 2014-01-17 MED ORDER — BISACODYL 5 MG PO TBEC: 5.0000 mg | DELAYED_RELEASE_TABLET | Freq: Once | ORAL | Status: DC

## 2014-01-17 MED ORDER — ACETAMINOPHEN 160 MG/5ML PO SOLN: 650.0000 mg | Freq: Once | ORAL | Status: AC

## 2014-01-17 MED ORDER — INSULIN REGULAR HUMAN 100 UNIT/ML IJ SOLN
250.0000 [IU] | INTRAMUSCULAR | Status: DC | PRN
  Administered 2014-01-17: 1 [IU]/h via INTRAVENOUS

## 2014-01-17 MED ORDER — SUCCINYLCHOLINE CHLORIDE 20 MG/ML IJ SOLN
INTRAMUSCULAR | Status: AC
  Filled 2014-01-17: qty 1

## 2014-01-17 MED ORDER — SODIUM CHLORIDE 0.9 % IV SOLN: INTRAVENOUS | Status: DC

## 2014-01-17 MED ORDER — MORPHINE SULFATE 2 MG/ML IJ SOLN
2.0000 mg | INTRAMUSCULAR | Status: DC | PRN
  Administered 2014-01-17 – 2014-01-18 (×2): 2 mg via INTRAVENOUS
  Filled 2014-01-17 (×2): qty 1

## 2014-01-17 MED ORDER — LACTATED RINGERS IV SOLN
INTRAVENOUS | Status: DC | PRN
  Administered 2014-01-17 (×2): via INTRAVENOUS

## 2014-01-17 MED ORDER — TRAMADOL HCL 50 MG PO TABS
50.0000 mg | ORAL_TABLET | ORAL | Status: DC | PRN
  Administered 2014-01-18: 100 mg via ORAL
  Filled 2014-01-17: qty 2

## 2014-01-17 MED ORDER — METOPROLOL TARTRATE 1 MG/ML IV SOLN: 2.5000 mg | INTRAVENOUS | Status: DC | PRN

## 2014-01-17 MED ORDER — THROMBIN 20000 UNITS EX SOLR
CUTANEOUS | Status: AC
  Filled 2014-01-17: qty 20000

## 2014-01-17 MED ORDER — HEPARIN SODIUM (PORCINE) 1000 UNIT/ML IJ SOLN
INTRAMUSCULAR | Status: AC
  Filled 2014-01-17: qty 2

## 2014-01-17 MED ORDER — PHENYLEPHRINE HCL 10 MG/ML IJ SOLN
INTRAMUSCULAR | Status: DC | PRN
  Administered 2014-01-17: 80 ug via INTRAVENOUS

## 2014-01-17 MED ORDER — EPHEDRINE SULFATE 50 MG/ML IJ SOLN
INTRAMUSCULAR | Status: AC
  Filled 2014-01-17: qty 1

## 2014-01-17 MED ORDER — POTASSIUM CHLORIDE 10 MEQ/50ML IV SOLN
10.0000 meq | INTRAVENOUS | Status: AC
  Administered 2014-01-17 (×3): 10 meq via INTRAVENOUS

## 2014-01-17 MED ORDER — PROPOFOL 10 MG/ML IV BOLUS
INTRAVENOUS | Status: AC
  Filled 2014-01-17: qty 20

## 2014-01-17 MED ORDER — THROMBIN 20000 UNITS EX SOLR
OROMUCOSAL | Status: DC | PRN
  Administered 2014-01-17 (×3): via TOPICAL

## 2014-01-17 MED ORDER — CHLORHEXIDINE GLUCONATE 0.12 % MT SOLN
15.0000 mL | Freq: Once | OROMUCOSAL | Status: AC
  Administered 2014-01-17: 15 mL via OROMUCOSAL
  Filled 2014-01-17: qty 15

## 2014-01-17 MED ORDER — SODIUM CHLORIDE 0.9 % IJ SOLN
INTRAMUSCULAR | Status: AC
  Filled 2014-01-17: qty 30

## 2014-01-17 MED ORDER — FAMOTIDINE IN NACL 20-0.9 MG/50ML-% IV SOLN
20.0000 mg | Freq: Two times a day (BID) | INTRAVENOUS | Status: AC
  Administered 2014-01-17 (×2): 20 mg via INTRAVENOUS
  Filled 2014-01-17: qty 50

## 2014-01-17 MED ORDER — PROPOFOL 10 MG/ML IV BOLUS
INTRAVENOUS | Status: DC | PRN
  Administered 2014-01-17: 20 mg via INTRAVENOUS
  Administered 2014-01-17: 30 mg via INTRAVENOUS
  Administered 2014-01-17 (×2): 20 mg via INTRAVENOUS
  Administered 2014-01-17: 10 mg via INTRAVENOUS
  Administered 2014-01-17: 20 mg via INTRAVENOUS

## 2014-01-17 MED ORDER — ARTIFICIAL TEARS OP OINT
TOPICAL_OINTMENT | OPHTHALMIC | Status: AC
  Filled 2014-01-17: qty 3.5

## 2014-01-17 MED ORDER — MIDAZOLAM HCL 10 MG/2ML IJ SOLN
INTRAMUSCULAR | Status: AC
  Filled 2014-01-17: qty 2

## 2014-01-17 MED ORDER — SODIUM CHLORIDE 0.9 % IV SOLN: 250.0000 mL | INTRAVENOUS | Status: DC

## 2014-01-17 MED ORDER — LACTATED RINGERS IV SOLN: INTRAVENOUS | Status: DC

## 2014-01-17 MED ORDER — OXYCODONE HCL 5 MG PO TABS
5.0000 mg | ORAL_TABLET | ORAL | Status: DC | PRN
  Administered 2014-01-18: 5 mg via ORAL
  Administered 2014-01-19 (×2): 10 mg via ORAL
  Administered 2014-01-19: 5 mg via ORAL
  Filled 2014-01-17: qty 1
  Filled 2014-01-17: qty 2
  Filled 2014-01-17 (×2): qty 1
  Filled 2014-01-17: qty 2

## 2014-01-17 MED ORDER — VANCOMYCIN HCL IN DEXTROSE 1-5 GM/200ML-% IV SOLN
1000.0000 mg | Freq: Once | INTRAVENOUS | Status: AC
  Administered 2014-01-17: 1000 mg via INTRAVENOUS
  Filled 2014-01-17: qty 200

## 2014-01-17 MED ORDER — ALBUMIN HUMAN 5 % IV SOLN
INTRAVENOUS | Status: DC | PRN
  Administered 2014-01-17: 13:00:00 via INTRAVENOUS

## 2014-01-17 MED ORDER — SODIUM CHLORIDE 0.45 % IV SOLN
INTRAVENOUS | Status: DC
  Administered 2014-01-17: 20 mL/h via INTRAVENOUS

## 2014-01-17 MED ORDER — FENTANYL CITRATE 0.05 MG/ML IJ SOLN
INTRAMUSCULAR | Status: AC
  Filled 2014-01-17: qty 5

## 2014-01-17 MED ORDER — ASPIRIN EC 325 MG PO TBEC
325.0000 mg | DELAYED_RELEASE_TABLET | Freq: Every day | ORAL | Status: DC
  Filled 2014-01-17: qty 1

## 2014-01-17 MED ORDER — SODIUM CHLORIDE 0.9 % IJ SOLN: 3.0000 mL | INTRAMUSCULAR | Status: DC | PRN

## 2014-01-17 MED ORDER — MIDAZOLAM HCL 2 MG/2ML IJ SOLN
2.0000 mg | INTRAMUSCULAR | Status: DC | PRN
Start: 2014-01-17 — End: 2014-01-18

## 2014-01-17 MED ORDER — ACETAMINOPHEN 650 MG RE SUPP
650.0000 mg | Freq: Once | RECTAL | Status: AC
  Administered 2014-01-17: 650 mg via RECTAL

## 2014-01-17 MED ORDER — LACTATED RINGERS IV SOLN: 500.0000 mL | Freq: Once | INTRAVENOUS | Status: AC | PRN

## 2014-01-17 MED ORDER — ARTIFICIAL TEARS OP OINT
TOPICAL_OINTMENT | OPHTHALMIC | Status: DC | PRN
  Administered 2014-01-17: 1 via OPHTHALMIC

## 2014-01-17 MED ORDER — PROTAMINE SULFATE 10 MG/ML IV SOLN
INTRAVENOUS | Status: AC
  Filled 2014-01-17: qty 25

## 2014-01-17 MED ORDER — PROTAMINE SULFATE 10 MG/ML IV SOLN
INTRAVENOUS | Status: DC | PRN
  Administered 2014-01-17 (×4): 30 mg via INTRAVENOUS

## 2014-01-17 MED ORDER — DEXMEDETOMIDINE HCL IN NACL 200 MCG/50ML IV SOLN: 0.0000 ug/kg/h | INTRAVENOUS | Status: DC

## 2014-01-17 MED ORDER — ROCURONIUM BROMIDE 50 MG/5ML IV SOLN
INTRAVENOUS | Status: AC
  Filled 2014-01-17: qty 2

## 2014-01-17 MED ORDER — THROMBIN 20000 UNITS EX SOLR
CUTANEOUS | Status: DC | PRN
  Administered 2014-01-17: 20000 [IU] via TOPICAL

## 2014-01-17 MED ORDER — ACETAMINOPHEN 160 MG/5ML PO SOLN: 1000.0000 mg | Freq: Four times a day (QID) | ORAL | Status: DC

## 2014-01-17 MED ORDER — INSULIN ASPART 100 UNIT/ML ~~LOC~~ SOLN
2.0000 [IU] | SUBCUTANEOUS | Status: DC
  Administered 2014-01-17: 2 [IU] via SUBCUTANEOUS
  Administered 2014-01-17: 4 [IU] via SUBCUTANEOUS

## 2014-01-17 MED ORDER — DEXTROSE 5 % IV SOLN
1.5000 g | Freq: Two times a day (BID) | INTRAVENOUS | Status: AC
  Administered 2014-01-17 – 2014-01-19 (×4): 1.5 g via INTRAVENOUS
  Filled 2014-01-17 (×4): qty 1.5

## 2014-01-17 MED ORDER — METOPROLOL TARTRATE 25 MG/10 ML ORAL SUSPENSION
12.5000 mg | Freq: Two times a day (BID) | ORAL | Status: DC
  Filled 2014-01-17 (×3): qty 5

## 2014-01-17 MED ORDER — ALBUMIN HUMAN 5 % IV SOLN
250.0000 mL | INTRAVENOUS | Status: AC | PRN
  Administered 2014-01-17 (×4): 250 mL via INTRAVENOUS
  Filled 2014-01-17 (×2): qty 250

## 2014-01-17 MED ORDER — PANTOPRAZOLE SODIUM 40 MG PO TBEC
40.0000 mg | DELAYED_RELEASE_TABLET | Freq: Every day | ORAL | Status: DC
  Administered 2014-01-19: 40 mg via ORAL
  Filled 2014-01-17: qty 1

## 2014-01-17 MED ORDER — HEMOSTATIC AGENTS (NO CHARGE) OPTIME
TOPICAL | Status: DC | PRN
  Administered 2014-01-17: 1 via TOPICAL

## 2014-01-17 MED ORDER — PHENYLEPHRINE HCL 10 MG/ML IJ SOLN
0.0000 ug/min | INTRAVENOUS | Status: DC
  Filled 2014-01-17 (×2): qty 2

## 2014-01-17 MED ORDER — INSULIN REGULAR BOLUS VIA INFUSION
0.0000 [IU] | Freq: Three times a day (TID) | INTRAVENOUS | Status: DC
  Filled 2014-01-17: qty 10

## 2014-01-17 MED ORDER — LIDOCAINE HCL (CARDIAC) 20 MG/ML IV SOLN
INTRAVENOUS | Status: DC | PRN
  Administered 2014-01-17: 60 mg via INTRAVENOUS

## 2014-01-17 MED ORDER — MIDAZOLAM HCL 5 MG/5ML IJ SOLN
INTRAMUSCULAR | Status: DC | PRN
  Administered 2014-01-17: 1 mg via INTRAVENOUS
  Administered 2014-01-17: 2 mg via INTRAVENOUS
  Administered 2014-01-17: 5 mg via INTRAVENOUS
  Administered 2014-01-17: 2 mg via INTRAVENOUS

## 2014-01-17 MED ORDER — 0.9 % SODIUM CHLORIDE (POUR BTL) OPTIME
TOPICAL | Status: DC | PRN
  Administered 2014-01-17: 6000 mL

## 2014-01-17 MED ORDER — DOCUSATE SODIUM 100 MG PO CAPS
200.0000 mg | ORAL_CAPSULE | Freq: Every day | ORAL | Status: DC
  Administered 2014-01-18 – 2014-01-19 (×2): 200 mg via ORAL
  Filled 2014-01-17 (×2): qty 2

## 2014-01-17 MED ORDER — BISACODYL 5 MG PO TBEC
10.0000 mg | DELAYED_RELEASE_TABLET | Freq: Every day | ORAL | Status: DC
  Administered 2014-01-18 – 2014-01-19 (×2): 10 mg via ORAL
  Filled 2014-01-17 (×2): qty 2

## 2014-01-17 MED ORDER — PHENYLEPHRINE HCL 10 MG/ML IJ SOLN
10.0000 mg | INTRAVENOUS | Status: DC | PRN
  Administered 2014-01-17: 20 ug/min via INTRAVENOUS

## 2014-01-17 MED ORDER — SODIUM CHLORIDE 0.9 % IV SOLN
INTRAVENOUS | Status: DC
  Filled 2014-01-17 (×2): qty 2.5

## 2014-01-17 MED ORDER — ASPIRIN 81 MG PO CHEW: 324.0000 mg | CHEWABLE_TABLET | Freq: Every day | ORAL | Status: DC

## 2014-01-17 MED ORDER — AMINOCAPROIC ACID 250 MG/ML IV SOLN
10.0000 g | INTRAVENOUS | Status: DC | PRN
  Administered 2014-01-17: 5 g/h via INTRAVENOUS

## 2014-01-17 MED ORDER — MAGNESIUM SULFATE 4 GM/100ML IV SOLN
4.0000 g | Freq: Once | INTRAVENOUS | Status: AC
  Administered 2014-01-17: 4 g via INTRAVENOUS
  Filled 2014-01-17: qty 100

## 2014-01-17 MED ORDER — BISACODYL 10 MG RE SUPP: 10.0000 mg | Freq: Every day | RECTAL | Status: DC

## 2014-01-17 MED ORDER — MORPHINE SULFATE 2 MG/ML IJ SOLN: 1.0000 mg | INTRAMUSCULAR | Status: AC | PRN

## 2014-01-17 MED ORDER — SODIUM CHLORIDE 0.9 % IV SOLN
200.0000 ug | INTRAVENOUS | Status: DC | PRN
  Administered 2014-01-17: 0.2 ug/kg/h via INTRAVENOUS

## 2014-01-17 SURGICAL SUPPLY — 119 items
ADAPTER CARDIO PERF ANTE/RETRO (ADAPTER) ×4 IMPLANT
ADPR PRFSN 84XANTGRD RTRGD (ADAPTER) ×2
APL SKNCLS STERI-STRIP NONHPOA (GAUZE/BANDAGES/DRESSINGS) ×2
ATTRACTOMAT 16X20 MAGNETIC DRP (DRAPES) ×2 IMPLANT
BAG DECANTER FOR FLEXI CONT (MISCELLANEOUS) ×4 IMPLANT
BANDAGE ELASTIC 4 VELCRO ST LF (GAUZE/BANDAGES/DRESSINGS) ×4 IMPLANT
BANDAGE ELASTIC 6 VELCRO ST LF (GAUZE/BANDAGES/DRESSINGS) ×4 IMPLANT
BASKET HEART  (ORDER IN 25'S) (MISCELLANEOUS) ×1
BASKET HEART (ORDER IN 25'S) (MISCELLANEOUS) ×1
BASKET HEART (ORDER IN 25S) (MISCELLANEOUS) ×2 IMPLANT
BENZOIN TINCTURE PRP APPL 2/3 (GAUZE/BANDAGES/DRESSINGS) ×2 IMPLANT
BLADE STERNUM SYSTEM 6 (BLADE) ×4 IMPLANT
BLADE SURG 11 STRL SS (BLADE) ×2 IMPLANT
BLADE SURG 15 STRL LF DISP TIS (BLADE) ×2 IMPLANT
BLADE SURG 15 STRL SS (BLADE) ×4
BNDG GAUZE ELAST 4 BULKY (GAUZE/BANDAGES/DRESSINGS) ×4 IMPLANT
CANISTER SUCTION 2500CC (MISCELLANEOUS) ×4 IMPLANT
CANNULA AORTIC ROOT 9FR (CANNULA) ×2 IMPLANT
CANNULA GUNDRY RCSP 15FR (MISCELLANEOUS) ×4 IMPLANT
CARDIAC SUCTION (MISCELLANEOUS) ×4 IMPLANT
CATH ROBINSON RED A/P 18FR (CATHETERS) ×12 IMPLANT
CATH THORACIC 28FR (CATHETERS) ×4 IMPLANT
CATH THORACIC 36FR (CATHETERS) ×4 IMPLANT
CATH THORACIC 36FR RT ANG (CATHETERS) ×4 IMPLANT
CLIP TI MEDIUM 24 (CLIP) IMPLANT
CLIP TI WIDE RED SMALL 24 (CLIP) ×6 IMPLANT
CLOSURE WOUND 1/2 X4 (GAUZE/BANDAGES/DRESSINGS) ×1
CONT SPEC 4OZ CLIKSEAL STRL BL (MISCELLANEOUS) ×2 IMPLANT
CONT SPEC STER OR (MISCELLANEOUS) ×4 IMPLANT
COVER SURGICAL LIGHT HANDLE (MISCELLANEOUS) ×2 IMPLANT
CRADLE DONUT ADULT HEAD (MISCELLANEOUS) ×4 IMPLANT
DRAPE CARDIOVASCULAR INCISE (DRAPES) ×4
DRAPE SLUSH/WARMER DISC (DRAPES) ×4 IMPLANT
DRAPE SRG 135X102X78XABS (DRAPES) ×2 IMPLANT
DRSG COVADERM 4X14 (GAUZE/BANDAGES/DRESSINGS) ×4 IMPLANT
ELECT CAUTERY BLADE 6.4 (BLADE) ×4 IMPLANT
ELECT REM PT RETURN 9FT ADLT (ELECTROSURGICAL) ×8
ELECTRODE REM PT RTRN 9FT ADLT (ELECTROSURGICAL) ×4 IMPLANT
GAUZE SPONGE 4X4 12PLY STRL (GAUZE/BANDAGES/DRESSINGS) ×8 IMPLANT
GLOVE BIO SURGEON STRL SZ 6 (GLOVE) ×4 IMPLANT
GLOVE BIO SURGEON STRL SZ 6.5 (GLOVE) ×2 IMPLANT
GLOVE BIO SURGEON STRL SZ7 (GLOVE) IMPLANT
GLOVE BIO SURGEON STRL SZ7.5 (GLOVE) IMPLANT
GLOVE BIO SURGEONS STRL SZ 6.5 (GLOVE) ×2
GLOVE BIOGEL PI IND STRL 6 (GLOVE) IMPLANT
GLOVE BIOGEL PI IND STRL 6.5 (GLOVE) IMPLANT
GLOVE BIOGEL PI IND STRL 7.0 (GLOVE) IMPLANT
GLOVE BIOGEL PI INDICATOR 6 (GLOVE) ×6
GLOVE BIOGEL PI INDICATOR 6.5 (GLOVE) ×4
GLOVE BIOGEL PI INDICATOR 7.0 (GLOVE) ×8
GLOVE EUDERMIC 7 POWDERFREE (GLOVE) ×8 IMPLANT
GLOVE ORTHO TXT STRL SZ7.5 (GLOVE) IMPLANT
GOWN STRL REUS W/ TWL LRG LVL3 (GOWN DISPOSABLE) ×8 IMPLANT
GOWN STRL REUS W/ TWL XL LVL3 (GOWN DISPOSABLE) ×2 IMPLANT
GOWN STRL REUS W/TWL LRG LVL3 (GOWN DISPOSABLE) ×28
GOWN STRL REUS W/TWL XL LVL3 (GOWN DISPOSABLE) ×4
HEART VENT LT CURVED (MISCELLANEOUS) ×4 IMPLANT
HEMOSTAT POWDER SURGIFOAM 1G (HEMOSTASIS) ×12 IMPLANT
HEMOSTAT SURGICEL 2X14 (HEMOSTASIS) ×4 IMPLANT
INSERT FOGARTY 61MM (MISCELLANEOUS) IMPLANT
INSERT FOGARTY XLG (MISCELLANEOUS) IMPLANT
KIT BASIN OR (CUSTOM PROCEDURE TRAY) ×4 IMPLANT
KIT CATH CPB BARTLE (MISCELLANEOUS) ×4 IMPLANT
KIT ROOM TURNOVER OR (KITS) ×4 IMPLANT
KIT SUCTION CATH 14FR (SUCTIONS) ×4 IMPLANT
KIT VASOVIEW W/TROCAR VH 2000 (KITS) ×4 IMPLANT
NS IRRIG 1000ML POUR BTL (IV SOLUTION) ×24 IMPLANT
PACK OPEN HEART (CUSTOM PROCEDURE TRAY) ×4 IMPLANT
PAD ARMBOARD 7.5X6 YLW CONV (MISCELLANEOUS) ×8 IMPLANT
PAD ELECT DEFIB RADIOL ZOLL (MISCELLANEOUS) ×4 IMPLANT
PENCIL BUTTON HOLSTER BLD 10FT (ELECTRODE) ×4 IMPLANT
PUNCH AORTIC ROTATE 4.0MM (MISCELLANEOUS) IMPLANT
PUNCH AORTIC ROTATE 4.5MM 8IN (MISCELLANEOUS) ×4 IMPLANT
PUNCH AORTIC ROTATE 5MM 8IN (MISCELLANEOUS) IMPLANT
SET CARDIOPLEGIA MPS 5001102 (MISCELLANEOUS) ×2 IMPLANT
SPONGE GAUZE 4X4 12PLY STER LF (GAUZE/BANDAGES/DRESSINGS) ×4 IMPLANT
SPONGE INTESTINAL PEANUT (DISPOSABLE) IMPLANT
SPONGE LAP 18X18 X RAY DECT (DISPOSABLE) IMPLANT
SPONGE LAP 4X18 X RAY DECT (DISPOSABLE) ×2 IMPLANT
STRIP CLOSURE SKIN 1/2X4 (GAUZE/BANDAGES/DRESSINGS) ×1 IMPLANT
SUT BONE WAX W31G (SUTURE) ×4 IMPLANT
SUT ETHIBON 2 0 V 52N 30 (SUTURE) ×8 IMPLANT
SUT ETHIBON EXCEL 2-0 V-5 (SUTURE) ×2 IMPLANT
SUT MNCRL AB 4-0 PS2 18 (SUTURE) ×2 IMPLANT
SUT PROLENE 3 0 SH DA (SUTURE) IMPLANT
SUT PROLENE 3 0 SH1 36 (SUTURE) ×4 IMPLANT
SUT PROLENE 4 0 RB 1 (SUTURE) ×12
SUT PROLENE 4 0 SH DA (SUTURE) IMPLANT
SUT PROLENE 4-0 RB1 .5 CRCL 36 (SUTURE) ×6 IMPLANT
SUT PROLENE 5 0 C 1 36 (SUTURE) IMPLANT
SUT PROLENE 6 0 C 1 30 (SUTURE) IMPLANT
SUT PROLENE 7 0 BV 1 (SUTURE) ×2 IMPLANT
SUT PROLENE 7 0 BV1 MDA (SUTURE) ×4 IMPLANT
SUT PROLENE 8 0 BV175 6 (SUTURE) IMPLANT
SUT SILK  1 MH (SUTURE)
SUT SILK 1 MH (SUTURE) IMPLANT
SUT STEEL 6MS V (SUTURE) ×4 IMPLANT
SUT STEEL STERNAL CCS#1 18IN (SUTURE) IMPLANT
SUT STEEL SZ 6 DBL 3X14 BALL (SUTURE) IMPLANT
SUT VIC AB 1 CTX 36 (SUTURE) ×12
SUT VIC AB 1 CTX36XBRD ANBCTR (SUTURE) ×4 IMPLANT
SUT VIC AB 2-0 CT1 27 (SUTURE)
SUT VIC AB 2-0 CT1 36 (SUTURE) ×2 IMPLANT
SUT VIC AB 2-0 CT1 TAPERPNT 27 (SUTURE) IMPLANT
SUT VIC AB 2-0 CTX 27 (SUTURE) IMPLANT
SUT VIC AB 3-0 SH 27 (SUTURE)
SUT VIC AB 3-0 SH 27X BRD (SUTURE) IMPLANT
SUT VIC AB 3-0 X1 27 (SUTURE) IMPLANT
SUT VICRYL 4-0 PS2 18IN ABS (SUTURE) IMPLANT
SUTURE E-PAK OPEN HEART (SUTURE) ×4 IMPLANT
SYSTEM SAHARA CHEST DRAIN ATS (WOUND CARE) ×4 IMPLANT
TAPE CLOTH SURG 4X10 WHT LF (GAUZE/BANDAGES/DRESSINGS) ×2 IMPLANT
TOWEL OR 17X24 6PK STRL BLUE (TOWEL DISPOSABLE) ×8 IMPLANT
TOWEL OR 17X26 10 PK STRL BLUE (TOWEL DISPOSABLE) ×8 IMPLANT
TRAY FOLEY IC TEMP SENS 16FR (CATHETERS) ×4 IMPLANT
TUBING INSUFFLATION (TUBING) ×4 IMPLANT
UNDERPAD 30X30 INCONTINENT (UNDERPADS AND DIAPERS) ×4 IMPLANT
VALVE MAGNA EASE 21MM (Prosthesis & Implant Heart) ×2 IMPLANT
WATER STERILE IRR 1000ML POUR (IV SOLUTION) ×8 IMPLANT

## 2014-01-17 NOTE — Progress Notes (Signed)
RT note- re attempt wean, continue to monitor.

## 2014-01-17 NOTE — Progress Notes (Signed)
*  PRELIMINARY RESULTS* Echocardiogram 2D Echocardiogram has been performed.  Nicole Mercado 01/17/2014, 9:17 AM

## 2014-01-17 NOTE — Transfer of Care (Signed)
Immediate Anesthesia Transfer of Care Note  Patient: Nicole Mercado  Procedure(s) Performed: Procedure(s): CORONARY ARTERY BYPASS GRAFTING (CABG)TIMES FOUR USING LEFT INTERNAL MAMMARY ARTERY AND RIGHT SAPHENOUS VEIN HARVESTED ENDOSCOPICALLY ([name]) AORTIC VALVE REPLACEMENT (AVR) ([name]) TRANSESOPHAGEAL ECHOCARDIOGRAM (TEE) ([name])  Patient Location: PACU and SICU  Anesthesia Type:General  Level of Consciousness: Patient remains intubated per anesthesia plan  Airway & Oxygen Therapy: Patient remains intubated per anesthesia plan and Patient placed on Ventilator (see vital sign flow sheet for setting)  Post-op Assessment: Report given to PACU RN and Post -op Vital signs reviewed and stable  Post vital signs: Reviewed and stable  Complications: No apparent anesthesia complications

## 2014-01-17 NOTE — Progress Notes (Addendum)
Pt's chart and events reviewed today.  S/p CABG x 4 and AVR.  Pt remains intubated.  No distress.  Pt transferred to Dr. Vivi Martens service.  Please call for any questions or concerns.  Syncope -orthostatics were positive but concerned about critical aortic stenosis -Patient was given IV fluids -repeat orthostatics were negative -01/11/14 echocardiogram EF 37-48%, grade 1 diastolic dysfunction, severe AS, bicuspid aortic valve  Critical AS -01/13/2014 regarding catheterization--nonobstructive coronaries  -Thoracic surgery to see -01/13/14--heart cath showed 70% distal left main stenosis extending to ostial LAD; 80% first diagnoal stenosis, 70% first obtuse marginal stenosis -s/p CABG x 4 and AVR on 01/17/14  Thrombocytopenia -secondary to CLL -worsening may be due to dilution, consumption from valvular dz, hematoma or CLL progression -prednisone started by heme-->platelets improving;  Prednisone was continued until day of surgery on 01/17/14 -appreciate Dr. Alvy Bimler -leukocytosis--chronic--likely related to CLL, steroids, stress demargination -no signs of infection, afebrile and hemodynamically stable -Baseline platelets 70-77K -01/16/14--platelets 83K -Serum vitamin B-12 is greater than 2000. Fibrinogen level is normal at 309. -She was started on vitamin B-12 and folate on 01/11/14  Leukocytosis -Partly due to steroids as well as CLL and stress demargination -Clinically stable and afebrile off abx prior to surgery   Bacteriuria -No significant pyuria to suggest UTI -Discontinued antibiotics and observed  Hypertension -Lisinopril discontinued -Continue metoprolol CKD stage 2-3 -Baseline creatinine 0.8-1.2 Carotid occlusion -Previous history of endartectomy. Recent Carotid Dopplers completed in 08/2013 as ordered by [facility]. Will not reorder.  DTat

## 2014-01-17 NOTE — Progress Notes (Signed)
RT note- patient not awake enough at this time, will attempt wean in 30 min.

## 2014-01-17 NOTE — Brief Op Note (Signed)
01/11/2014 - 01/17/2014  12:01 PM  PATIENT:  Nicole Mercado  74 y.o. female  PRE-OPERATIVE DIAGNOSIS:  CAD, SEVERE AS  POST-OPERATIVE DIAGNOSIS:  CAD, SEVERE AS  PROCEDURE:  CORONARY ARTERY BYPASS GRAFTING x 4 (LIMA-LAD, SVG-D1, SVG-D2, SVG-OM1) ENDOSCOPIC VEIN HARVEST RIGHT LEG AORTIC VALVE REPLACEMENT (21 mm Edwards Magna Ease pericardial tissue valve)  SURGEON:  Gaye Pollack, MD  ASSISTANT: Suzzanne Cloud, PA-C  ANESTHESIA:   general  PATIENT CONDITION:  ICU - intubated and hemodynamically stable.  PRE-OPERATIVE WEIGHT: 45 kg   Aortic Valve Etiology   Aortic Insufficiency:  Trivial/Trace  Aortic Valve Disease:  Yes.  Aortic Stenosis:  Yes. Smallest Aortic Valve Area: 0.45cm2; Highest Mean Gradient: 47mmHg.  Etiology (Choose at least one and up to  5 etiologies):  Degenerative - Calcified   Aortic Valve  Procedure Performed:  Replacement: Yes.  Bioprosthetic Valve. Implant Model Number:[id number], Size:21, Unique Device Identifier:[id number]  Repair/Reconstruction: No.   Aortic Annular Enlargement: No.

## 2014-01-17 NOTE — Op Note (Signed)
CARDIOVASCULAR SURGERY OPERATIVE NOTE  01/17/2014  Surgeon:  Gaye Pollack, MD  First Assistant: Suzzanne Cloud, PA-C   Preoperative Diagnosis:   1. Severe multi-vessel coronary artery disease 2. Severe aortic stenosis   Postoperative Diagnosis:  Same   Procedure:  1. Median Sternotomy 2. Extracorporeal circulation 3.   Coronary artery bypass grafting x 4   Left internal mammary graft to the LAD  SVG to OM1  SVG to D1  SVG to D2 4.   Endoscopic vein harvest from the right leg   Anesthesia:  General Endotracheal   Clinical History/Surgical Indication:  The patient has a several year history of chronic lymphocytic leukemia and chronic thrombocytopenia, hypertension, and severe aortic stenosis that was followed by Dr. Dan Maker prior to his retirement. An echocardiogram obtained in November 2013 showed a trileaflet aortic valve with severe calcification and severe aortic stenosis, EF was greater than 55%. She had a repeat echocardiogram in May 2014 which showed EF 08-67%, grade 1 diastolic dysfunction, aortic valve area 0.49, mild MR, PA peak pressure 41 mmHg. She was seen by Dr. Servando Snare for consideration of surgery but refused at that time and was thinking about having surgery in [place] where her daughter-in-law works as a Marine scientist. She has been very active at home and live independently. She is able to does her own cooking, cleaning, and shopping without any difficulty. She denies prior history of chest pain with or without exertion. She denies prior history of dizziness, headache, presyncope or syncope before October. In October she reports an episode of dizziness after a flu shot that she thought was a reaction. Since then she reports a few episodes of dizziness but has not passed out. Then in the shower on the morning of 01/11/2014 she had sudden onset of dizziness and passed out. She  states she must have passed out for no more than a minute. She woke up on the floor and called her friend who contacted the EMS. On arrival to [facility], her blood pressure was 152/56. O2 saturation 100% on room air. EKG showed atrial ectopy with frequent PACs. Chest x-ray showed no acute disease. CT of the head showed soft tissue injuries in the right periatal region without acute intracranial abnormality, multilevel degenerative changes in the spine without acute abnormality. Urinalysis shows many bacteria and positive nitrite. Urine culture showed > 100000 Ecoli that is pan-sensitive. She was placed on IV Ceftriaxone on 01/11/2014. A repeat echo on 01/11/2014 shows critical AS with a mean gradient of 61 and a peak of 104. The AVA is 0.45 cm2. The aorta is normal sized. LVEF is 60-65%. Cardiac cath yesterday shows 70% distal LM extending into the ostium of the LAD. D1 has 80% proximal stenosis and D2 has 70% proximal stenosis. The OM1 has 70% ostial stenosis. I have personally reviewed her echo and cath films, reviewed her records and examined the patient. She has significant left main and 2-vessel coronary disease with critical aortic  stenosis presenting with syncope. I agree that CABG and AVR is the best treatment for her. Her BSA is only 1.37 m2 and she may have a small annulus requiring a root replacement. Her operative risk is increased due to her CLL with chronic anemia and thrombocytopenia. I discussed the operative procedure with the patient and family including alternatives, benefits and risks; including but not limited to bleeding, blood transfusion, infection, stroke, myocardial infarction, graft failure, heart block requiring a permanent pacemaker, organ dysfunction, and death. [name] understands and agrees to proceed.   Preparation:  The patient was seen in the preoperative holding area and the correct patient, correct operation were confirmed with the patient after reviewing the  medical record and catheterization. The consent was signed by me. Preoperative antibiotics were given. A pulmonary arterial line and radial arterial line were placed by the anesthesia team. The patient was taken back to the operating room and positioned supine on the operating room table. After being placed under general endotracheal anesthesia by the anesthesia team a foley catheter was placed. The neck, chest, abdomen, and both legs were prepped with betadine soap and solution and draped in the usual sterile manner. A surgical time-out was taken and the correct patient and operative procedure were confirmed with the nursing and anesthesia staff.   Cardiopulmonary Bypass:  A median sternotomy was performed. The pericardium was opened in the midline. Right ventricular function appeared normal. The ascending aorta was of normal size and had no palpable plaque. There were no contraindications to aortic cannulation or cross-clamping. The patient was fully systemically heparinized and the ACT was maintained > 400 sec. The proximal aortic arch was cannulated with a 20 F aortic cannula for arterial inflow. Venous cannulation was performed via the right atrial appendage using a two-staged venous cannula. An antegrade cardioplegia/vent cannula was inserted into the mid-ascending aorta. A left ventricular vent was placed through the right superior pulmonary vein. A retrograde cardioplegia cannula was placed throught the right atrium into the coronary sinus. Aortic occlusion was performed with a single cross-clamp. Systemic cooling to 32 degrees Centigrade and topical cooling of the heart with iced saline were used. Hyperkalemic antegrade cold blood cardioplegia was used to induce diastolic arrest and was then given at about 20 minute intervals throughout the period of arrest to maintain myocardial temperature at or below 10 degrees centigrade. Cold blood retrograde cardioplegia was given during the aortic valve  replacement while the aorta was open. A temperature probe was inserted into the interventricular septum and an insulating pad was placed in the pericardium. Carbon dioxide was infused into the pericardial cavity during the case to minimize intra-cardiac air.   Left internal mammary harvest:  The left side of the sternum was retracted using the Rultract retractor. The left internal mammary artery was harvested as a pedicle graft. All side branches were clipped. It was a medium-sized vessel of good quality with excellent blood flow. It was ligated distally and divided. It was sprayed with topical papaverine solution to prevent vasospasm.   Endoscopic vein harvest:  The right greater saphenous vein was harvested endoscopically through a 2 cm incision medial to the right knee. It was harvested from the upper thigh to below the knee. It was a medium-sized vein of good quality. The side branches were all ligated with 4-0 silk ties.    Coronary arteries:  The coronary arteries were examined.   LAD:  No distal disease in LAD or D1, D2  LCX:  OM was  intramyocardial but located in the mid portion where there was no disease  RCA:  No stenosis on angio. No distal disease seen.   Grafts:  1. LIMA to the LAD: 1.6 mm. It was sewn end to side using 8-0 prolene continuous suture. 2. SVG to OM1:  1.6 mm. It was sewn end to side using 7-0 prolene continuous suture. 3. SVG to D1:  1.6 mm. It was sewn end to side using 7-0 prolene continuous suture. 4. SVG to D2:  1.6 mm. It was sewn end to side using 7-0 prolene continuous suture.  The proximal vein graft anastomoses of the OM1 and D1 grafts were performed to the mid-ascending aorta using continuous 6-0 prolene suture. The proximal anastomosis of the D2 graft was performed to the hood of the OM1 graft using continuous 7-0 prolene suture since there was limited aortic length after opening for AVR. Graft markers were placed around the proximal  anastomoses.  Aortic Valve Replacement:   A transverse aortotomy was performed 1 cm above the take-off of the right coronary artery. The native valve was tricuspid with complete fusion of the right and left cusps,  calcified leaflets and severe annular calcification. The ostia of the coronary arteries were in normal position and were not obstructed. The native valve leaflets were excised and the annulus was decalcified with rongeurs. Care was taken to remove all particulate debris. The left ventricle was directly inspected for debris and then irrigated with ice saline solution. The annulus was sized and a size 21 mm [employer] Ease pericardial valve was chosen. The model number was [id number] and the serial number was [id number]. While the valve was being prepared 2-0 Ethibond pledgeted horizontal mattress sutures were placed around the annulus with the pledgets in a sub-annular position. The sutures were placed through the sewing ring and the valve lowered into place. The sutures were tied sequentially. The valve seated nicely and the coronary ostia were not obstructed. The prosthetic valve leaflets moved normally and there was no sub-valvular obstruction. The aortotomy was closed using 4-0 Prolene suture in 2 layers with felt strips to reinforce the closure.  Completion:  The patient was rewarmed to 37 degrees Centigrade. The clamp was removed from the LIMA pedicle and there was rapid warming of the septum and return of ventricular fibrillation. The crossclamp was removed with a time of 142 minutes. There was spontaneous return of sinus rhythm. The distal and proximal anastomoses were checked for hemostasis. The position of the grafts was satisfactory. Two temporary epicardial pacing wires were placed on the right atrium and two on the right ventricle. The patient was weaned from CPB without difficulty on no inotropes. CPB time was 167 minutes. Cardiac output was 4 LPM. TEE showed an normal functioning aortic  valve prosthesis with no AI or perivalvular leak. There was no MR. LV function was normal. Heparin was fully reversed with protamine and the aortic and venous cannulas removed. Hemostasis was achieved. Mediastinal and left pleural drainage tubes were placed. The sternum was closed with  #6 stainless steel wires. The fascia was closed with continuous # 1 vicryl suture. The subcutaneous tissue was closed with 2-0 vicryl continuous suture. The skin was closed with 3-0 vicryl subcuticular suture. All sponge, needle, and instrument counts were reported correct at the end of the case. Dry sterile dressings were placed over the incisions and around the chest tubes which were connected to pleurevac suction. The patient was then transported to the surgical intensive care unit in  critical but stable condition.

## 2014-01-17 NOTE — Progress Notes (Signed)
Patient ID: CYRENE GHARIBIAN, female   DOB: 10-20-1940, 74 y.o.   MRN: [id number] EVENING ROUNDS NOTE :     [address]       [place] [address]             [phone]                 Day of Surgery Procedure(s) (LRB): CORONARY ARTERY BYPASS GRAFTING (CABG)TIMES FOUR USING LEFT INTERNAL MAMMARY ARTERY AND RIGHT SAPHENOUS VEIN HARVESTED ENDOSCOPICALLY ([name]) AORTIC VALVE REPLACEMENT (AVR) ([name]) TRANSESOPHAGEAL ECHOCARDIOGRAM (TEE) ([name])  Total Length of Stay:  LOS: 6 days  BP 109/42 mmHg  Pulse 90  Temp(Src) 100 F (37.8 C) (Oral)  Resp 9  Ht 5' (1.524 m)  Wt 99 lb 12.8 oz (45.269 kg)  BMI 19.49 kg/m2  SpO2 100%  .Intake/Output      01/11 0701 - 01/12 0700 01/12 0701 - 01/13 0700   P.O. 240    I.V. (mL/kg)  3791.7 (83.8)   Blood  675   IV Piggyback  850   Total Intake(mL/kg) 240 (5.3) 5316.7 (117.4)   Urine (mL/kg/hr)  1000 (2)   Blood  750 (1.5)   Chest Tube  210 (0.4)   Total Output   1960   Net +240 +3356.7        Urine Occurrence 3 x      . sodium chloride 20 mL/hr (01/17/14 1400)  . [START ON 01/18/2014] sodium chloride    . sodium chloride 20 mL (01/17/14 1400)  . dexmedetomidine Stopped (01/17/14 1600)  . insulin (NOVOLIN-R) infusion 0.5 Units/hr (01/17/14 1700)  . lactated ringers 20 mL/hr (01/17/14 1400)  . nitroGLYCERIN Stopped (01/17/14 1440)  . phenylephrine (NEO-SYNEPHRINE) Adult infusion 15 mcg/min (01/17/14 1700)     Lab Results  Component Value Date   WBC 31.1* 01/17/2014   HGB 9.2* 01/17/2014   HCT 26.6* 01/17/2014   PLT 110* 01/17/2014   GLUCOSE 83 01/17/2014   ALT 13 01/12/2014   AST 23 01/12/2014   NA 140 01/17/2014   K 3.5 01/17/2014   CL 101 01/17/2014   CREATININE 0.80 01/17/2014   BUN 18 01/17/2014   CO2 26 01/17/2014   TSH 0.705 01/11/2014   INR 1.52* 01/17/2014   Still on vent, waking up ci little low, pa's low will give additional volume  Grace Isaac MD  Beeper [phone] Office [phone] 01/17/2014  6:07 PM

## 2014-01-17 NOTE — Anesthesia Preprocedure Evaluation (Signed)
Anesthesia Evaluation  Patient identified by MRN, date of birth, ID band Patient awake    Reviewed: Allergy & Precautions, NPO status , Patient's Chart, lab work & pertinent test results, reviewed documented beta blocker date and time   Airway Mallampati: II  TM Distance: >3 FB Neck ROM: Full    Dental  (+) Teeth Intact, Dental Advisory Given   Pulmonary          Cardiovascular hypertension, Pt. on medications and Pt. on home beta blockers + CAD and + Peripheral Vascular Disease + dysrhythmias + Valvular Problems/Murmurs     Neuro/Psych    GI/Hepatic   Endo/Other    Renal/GU      Musculoskeletal  (+) Arthritis -,   Abdominal   Peds  Hematology   Anesthesia Other Findings   Reproductive/Obstetrics                             Anesthesia Physical Anesthesia Plan  ASA: III  Anesthesia Plan: General   Post-op Pain Management:    Induction: Intravenous  Airway Management Planned: Oral ETT  Additional Equipment: Arterial line, CVP, PA Cath and TEE  Intra-op Plan:   Post-operative Plan: Post-operative intubation/ventilation  Informed Consent: I have reviewed the patients History and Physical, chart, labs and discussed the procedure including the risks, benefits and alternatives for the proposed anesthesia with the patient or authorized representative who has indicated his/her understanding and acceptance.   Dental advisory given  Plan Discussed with: CRNA and Anesthesiologist  Anesthesia Plan Comments:         Anesthesia Quick Evaluation

## 2014-01-17 NOTE — Procedures (Signed)
Extubation Procedure Note  Patient Details:   Name: GERTHA LICHTENBERG DOB: 03/21/1940 MRN: [id number]   Airway Documentation:     Evaluation  O2 sats: stable throughout Complications: No apparent complications Patient did tolerate procedure well. Bilateral Breath Sounds: Clear, Diminished   Yes  IS 465ml 4l/min [place]  Revonda Standard 01/17/2014, 7:09 PM

## 2014-01-17 NOTE — Progress Notes (Signed)
Nurse notified MD Servando Snare who was doing evening rounds about pts cardiac index 1.3 for the past 2 hour and UOP 105 after receiving 2 albumins and turning up the PACER HR from 80 to 90; orders obtained to give 1 more albumin and to review H/H 6 hour labs; nurse will continue to monitor.

## 2014-01-17 NOTE — Anesthesia Procedure Notes (Signed)
Procedure Name: Intubation Date/Time: 01/17/2014 8:16 AM Performed by: Ollen Bowl Pre-anesthesia Checklist: Patient identified, Emergency Drugs available, Suction available, Patient being monitored and Timeout performed Patient Re-evaluated:Patient Re-evaluated prior to inductionOxygen Delivery Method: Circle system utilized Preoxygenation: Pre-oxygenation with 100% oxygen Intubation Type: IV induction Ventilation: Mask ventilation without difficulty Laryngoscope Size: Mac and 3 Grade View: Grade I Tube type: Oral Tube size: 8.0 mm Number of attempts: 1 Airway Equipment and Method: Stylet Placement Confirmation: ETT inserted through vocal cords under direct vision,  positive ETCO2 and breath sounds checked- equal and bilateral Secured at: 22 cm Tube secured with: Tape Dental Injury: Teeth and Oropharynx as per pre-operative assessment

## 2014-01-17 NOTE — Anesthesia Postprocedure Evaluation (Signed)
  Anesthesia Post-op Note  Patient: Nicole Mercado  Procedure(s) Performed: Procedure(s): CORONARY ARTERY BYPASS GRAFTING (CABG)TIMES FOUR USING LEFT INTERNAL MAMMARY ARTERY AND RIGHT SAPHENOUS VEIN HARVESTED ENDOSCOPICALLY ([name]) AORTIC VALVE REPLACEMENT (AVR) ([name]) TRANSESOPHAGEAL ECHOCARDIOGRAM (TEE) ([name])  Patient Location: ICU  Anesthesia Type:General  Level of Consciousness: sedated  Airway and Oxygen Therapy: Patient remains intubated per anesthesia plan  Post-op Pain: none  Post-op Assessment: Post-op Vital signs reviewed, Patient's Cardiovascular Status Stable and Respiratory Function Stable   Post-op Vital Signs: Reviewed and stable  Last Vitals:  Filed Vitals:   01/17/14 1600  BP: 89/25  Pulse: 90  Temp: 37.3 C  Resp: 12    Complications: No apparent anesthesia complications

## 2014-01-17 NOTE — OR Nursing (Signed)
1247 first call made to SICU, 1317 second call made to SICU

## 2014-01-17 NOTE — Progress Notes (Signed)
UR Completed.  [phone]  

## 2014-01-18 ENCOUNTER — Inpatient Hospital Stay (HOSPITAL_COMMUNITY): Payer: Medicare Other

## 2014-01-18 LAB — PREPARE PLATELET PHERESIS
Unit division: 0
Unit division: 0

## 2014-01-18 LAB — CULTURE, BLOOD (ROUTINE X 2)
Culture: NO GROWTH
Culture: NO GROWTH

## 2014-01-18 LAB — GLUCOSE, CAPILLARY
GLUCOSE-CAPILLARY: 100 mg/dL — AB (ref 70–99)
GLUCOSE-CAPILLARY: 102 mg/dL — AB (ref 70–99)
GLUCOSE-CAPILLARY: 100 mg/dL — AB (ref 70–99)
GLUCOSE-CAPILLARY: 114 mg/dL — AB (ref 70–99)
Glucose-Capillary: 116 mg/dL — ABNORMAL HIGH (ref 70–99)
Glucose-Capillary: 118 mg/dL — ABNORMAL HIGH (ref 70–99)

## 2014-01-18 LAB — POCT I-STAT, CHEM 8
BUN: 20 mg/dL (ref 6–23)
Calcium, Ion: 1.26 mmol/L (ref 1.13–1.30)
Chloride: 101 mEq/L (ref 96–112)
Creatinine, Ser: 1.2 mg/dL — ABNORMAL HIGH (ref 0.50–1.10)
Glucose, Bld: 120 mg/dL — ABNORMAL HIGH (ref 70–99)
HCT: 25 % — ABNORMAL LOW (ref 36.0–46.0)
HEMOGLOBIN: 8.5 g/dL — AB (ref 12.0–15.0)
Potassium: 4.2 mmol/L (ref 3.5–5.1)
SODIUM: 134 mmol/L — AB (ref 135–145)
TCO2: 22 mmol/L (ref 0–100)

## 2014-01-18 LAB — CREATININE, SERUM
Creatinine, Ser: 1.3 mg/dL — ABNORMAL HIGH (ref 0.50–1.10)
GFR calc non Af Amer: 40 mL/min — ABNORMAL LOW (ref >90)
GFR, EST AFRICAN AMERICAN: 46 mL/min — AB (ref >90)

## 2014-01-18 LAB — CBC
HCT: 25.5 % — ABNORMAL LOW (ref 36.0–46.0)
HCT: 26.1 % — ABNORMAL LOW (ref 36.0–46.0)
Hemoglobin: 8.8 g/dL — ABNORMAL LOW (ref 12.0–15.0)
Hemoglobin: 8.8 g/dL — ABNORMAL LOW (ref 12.0–15.0)
MCH: 31.4 pg (ref 26.0–34.0)
MCH: 30.4 pg (ref 26.0–34.0)
MCHC: 34.5 g/dL (ref 30.0–36.0)
MCHC: 33.7 g/dL (ref 30.0–36.0)
MCV: 91.1 fL (ref 78.0–100.0)
MCV: 90.3 fL (ref 78.0–100.0)
PLATELETS: 64 10*3/uL — AB (ref 150–400)
Platelets: 76 10*3/uL — ABNORMAL LOW (ref 150–400)
RBC: 2.8 MIL/uL — ABNORMAL LOW (ref 3.87–5.11)
RBC: 2.89 MIL/uL — AB (ref 3.87–5.11)
RDW: 17.1 % — ABNORMAL HIGH (ref 11.5–15.5)
RDW: 17.4 % — ABNORMAL HIGH (ref 11.5–15.5)
WBC: 6.7 10*3/uL (ref 4.0–10.5)
WBC: 10.6 10*3/uL — ABNORMAL HIGH (ref 4.0–10.5)

## 2014-01-18 LAB — BASIC METABOLIC PANEL
ANION GAP: 4 — AB (ref 5–15)
BUN: 16 mg/dL (ref 6–23)
CHLORIDE: 105 meq/L (ref 96–112)
CO2: 24 mmol/L (ref 19–32)
Calcium: 8.1 mg/dL — ABNORMAL LOW (ref 8.4–10.5)
Creatinine, Ser: 1.05 mg/dL (ref 0.50–1.10)
GFR calc non Af Amer: 51 mL/min — ABNORMAL LOW (ref >90)
GFR, EST AFRICAN AMERICAN: 60 mL/min — AB (ref >90)
GLUCOSE: 113 mg/dL — AB (ref 70–99)
Potassium: 4.7 mmol/L (ref 3.5–5.1)
Sodium: 133 mmol/L — ABNORMAL LOW (ref 135–145)

## 2014-01-18 LAB — MAGNESIUM
Magnesium: 2.9 mg/dL — ABNORMAL HIGH (ref 1.5–2.5)
Magnesium: 2.4 mg/dL (ref 1.5–2.5)

## 2014-01-18 IMAGING — CR DG CHEST 1V PORT
1 series · 1 of 1 positions shown · non-contrast
Comparison: Portable chest x-ray [DATE]

CLINICAL DATA: Status post CABG and aortic valve replacement.

EXAM:
PORTABLE CHEST - 1 VIEW

[AP]
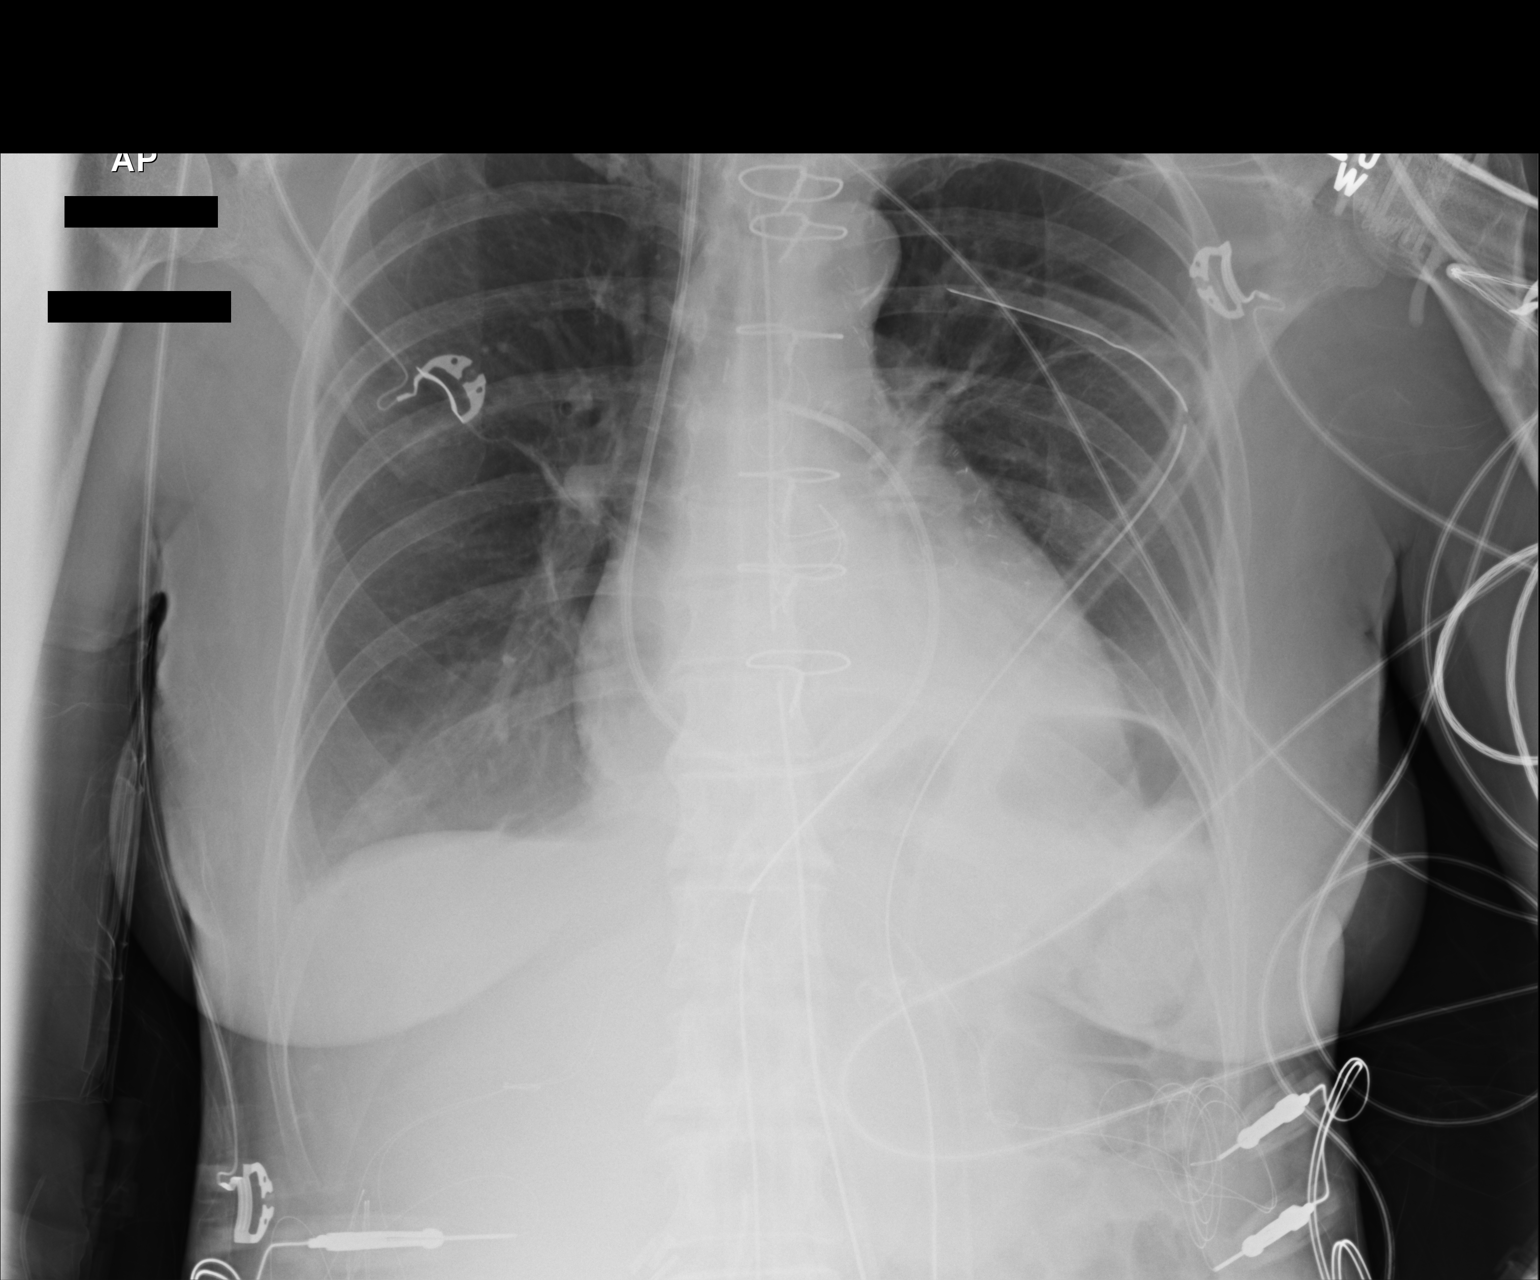

[1 of 1 positions shown; findings below may reference images not displayed]

FINDINGS: The right lung is well-expanded and clear. On the left there is
retrocardiac subsegmental atelectasis or early infiltrate. The tiny
left apical pneumothorax is not evident today. The cardiac
silhouette is top-normal in size. The pulmonary vascularity is not
engorged. The prosthetic aortic valve is visible. There are 7 intact
sternal wires.

The trachea and esophagus have been extubated. The Swan-Ganz
catheter tip lies in the region of the proximal right main pulmonary
artery. A left-sided chest tube is unchanged with the tip projecting
over the posterior medial aspect of the sixth rib. The mediastinal
drain is unchanged with the tip projecting over the T5 region. A
second lower chest tube -mediastinal drain on the left is stable
with the tip lying in the medial costophrenic gutter.
IMPRESSION: Interval extubation of the trachea and esophagus. The support tubes
and lines are in appropriate position. No significant pneumothorax
is demonstrated today. There is left lower lobe atelectasis and/or
pneumonia that is slightly more conspicuous today.

## 2014-01-18 MED ORDER — CHLORHEXIDINE GLUCONATE 0.12 % MT SOLN
15.0000 mL | Freq: Two times a day (BID) | OROMUCOSAL | Status: DC
  Administered 2014-01-18 – 2014-01-19 (×3): 15 mL via OROMUCOSAL
  Filled 2014-01-18 (×3): qty 15

## 2014-01-18 MED ORDER — CETYLPYRIDINIUM CHLORIDE 0.05 % MT LIQD
7.0000 mL | Freq: Two times a day (BID) | OROMUCOSAL | Status: DC
  Administered 2014-01-18 (×2): 7 mL via OROMUCOSAL

## 2014-01-18 MED ORDER — FUROSEMIDE 10 MG/ML IJ SOLN
40.0000 mg | Freq: Once | INTRAMUSCULAR | Status: AC
  Administered 2014-01-18: 40 mg via INTRAVENOUS

## 2014-01-18 MED ORDER — FUROSEMIDE 10 MG/ML IJ SOLN: 20.0000 mg | Freq: Once | INTRAMUSCULAR | Status: DC

## 2014-01-18 MED ORDER — ASPIRIN 81 MG PO CHEW: 81.0000 mg | CHEWABLE_TABLET | Freq: Every day | ORAL | Status: DC

## 2014-01-18 MED ORDER — INSULIN ASPART 100 UNIT/ML ~~LOC~~ SOLN: 0.0000 [IU] | SUBCUTANEOUS | Status: DC

## 2014-01-18 MED ORDER — ASPIRIN EC 81 MG PO TBEC
81.0000 mg | DELAYED_RELEASE_TABLET | Freq: Every day | ORAL | Status: DC
  Administered 2014-01-18 – 2014-01-19 (×2): 81 mg via ORAL
  Filled 2014-01-18 (×2): qty 1

## 2014-01-18 MED FILL — Potassium Chloride Inj 2 mEq/ML: INTRAVENOUS | Qty: 40 | Status: AC

## 2014-01-18 MED FILL — Dexmedetomidine HCl in NaCl 0.9% IV Soln 400 MCG/100ML: INTRAVENOUS | Qty: 100 | Status: AC

## 2014-01-18 MED FILL — Heparin Sodium (Porcine) Inj 1000 Unit/ML: INTRAMUSCULAR | Qty: 10 | Status: AC

## 2014-01-18 MED FILL — Lidocaine HCl IV Inj 20 MG/ML: INTRAVENOUS | Qty: 5 | Status: AC

## 2014-01-18 MED FILL — Sodium Chloride IV Soln 0.9%: INTRAVENOUS | Qty: 2000 | Status: AC

## 2014-01-18 MED FILL — Sodium Bicarbonate IV Soln 8.4%: INTRAVENOUS | Qty: 50 | Status: AC

## 2014-01-18 MED FILL — Magnesium Sulfate Inj 50%: INTRAMUSCULAR | Qty: 10 | Status: AC

## 2014-01-18 MED FILL — Electrolyte-R (PH 7.4) Solution: INTRAVENOUS | Qty: 3000 | Status: AC

## 2014-01-18 MED FILL — Mannitol IV Soln 20%: INTRAVENOUS | Qty: 500 | Status: AC

## 2014-01-18 MED FILL — Albumin, Human Inj 5%: INTRAVENOUS | Qty: 250 | Status: AC

## 2014-01-18 MED FILL — Heparin Sodium (Porcine) Inj 1000 Unit/ML: INTRAMUSCULAR | Qty: 30 | Status: AC

## 2014-01-18 NOTE — Clinical Social Work Note (Signed)
Met with patient to discuss possible SNF placement per PT once she is medically ready.  Explained to patient the process for looking for SNF facilities.  Patient and family said they would think about going to a SNF,   Patient was agreeable to beginning the SNF placement process however would prefer home health.  Patient states she has some family that can stay with her once she is discharged.  CSW gave patient and family list of facilities to review, informed family they can research facilities on www.Medicare.gov. Placed FL2 on patient's chart for physician to sign.   R. , MSW, LCSWA [phone] 01/18/2014 2:35 PM   

## 2014-01-18 NOTE — Clinical Social Work Psychosocial (Signed)
Clinical Social Work Department BRIEF PSYCHOSOCIAL ASSESSMENT 01/18/2014  Patient:  Nicole Mercado, Nicole Mercado     Account Number:  [id number]     Admit date:  01/11/2014  Clinical Social Worker:  Dian Queen  Date/Time:  01/18/2014 04:24 PM  Referred by:  Physician  Date Referred:  01/18/2014 Referred for  SNF Placement   Other Referral:   Interview type:  Patient Other interview type:   Family    PSYCHOSOCIAL DATA Living Status:  FAMILY Admitted from facility:   Level of care:   Primary support name:   Primary support relationship to patient:  FAMILY Degree of support available:   Patient states she has a daughter who can stay with her once she discharges from the hospital.    CURRENT CONCERNS Current Concerns  Post-Acute Placement   Other Concerns:    SOCIAL WORK ASSESSMENT / PLAN Patient is a 74 year old female who lives on her own, but has family who can stay with her once she discharges from the hospital.  Patient was alert and oriented x3 and talkative, patient had family at bedside.  Patient was asked about if she had been to a SNF for rehab in the past, and she said no.  Patient was explained about SNF rehab and she said she would think about going if she had to. Patient states she would prefer home health, but understands if it is recommended she may go.  Patient did not have any other questions, plan for patient is to possibly go to SNF for short term rehab if she needs to. Patient is in agreement to start the SNF placement process. Patient may discharge to SNF for rehab or home health once medically ready and discharge orders have been received.   Assessment/plan status:  Psychosocial Support/Ongoing Assessment of Needs Other assessment/ plan:   Information/referral to community resources:    PATIENT'S/FAMILY'S RESPONSE TO PLAN OF CARE: Patient and family will think about going to SNF for rehab, but would prefer home health.   [name]. [place], MSW,  [place] 01/18/2014 4:34 PM

## 2014-01-18 NOTE — Progress Notes (Signed)
Nicole Mercado   DOB:[birth year]   HA#:[id number]    Subjective: She feels well. Complained of mild weakness. She stay 1 status post bypass surgery and valvular heart replacement. She was successfully extubated. She received perioperative transfusion. The patient denies any recent signs or symptoms of bleeding such as spontaneous epistaxis, hematuria or hematochezia.  Objective:  Filed Vitals:   01/18/14 0700  BP: 108/44  Pulse: 81  Temp: 98.6 F (37 C)  Resp: 18     Intake/Output Summary (Last 24 hours) at 01/18/14 0838 Last data filed at 01/18/14 0700  Gross per 24 hour  Intake 6986.09 ml  Output   2840 ml  Net 4146.09 ml    GENERAL:alert, no distress and comfortable SKIN: skin color is pale, texture, turgor are normal, no rashes or significant lesions. Multiple bruises on noted EYES: normal, Conjunctiva are pale and non-injected, sclera clear OROPHARYNX:no exudate, no erythema and lips, buccal mucosa, and tongue normal  LUNGS: clear to auscultation and percussion with normal breathing effort. Noticed sternotomy scar HEART: Mild tachycardia. No significant edema. ABDOMEN:abdomen soft, non-tender and normal bowel sounds Musculoskeletal:no cyanosis of digits and no clubbing  NEURO: alert & oriented x 3 with fluent speech, no focal motor/sensory deficits   Labs:  Lab Results  Component Value Date   WBC 6.7 01/18/2014   HGB 8.8* 01/18/2014   HCT 25.5* 01/18/2014   MCV 91.1 01/18/2014   PLT 76* 01/18/2014   NEUTROABS 2.5 01/11/2014    Lab Results  Component Value Date   NA 133* 01/18/2014   K 4.7 01/18/2014   CL 105 01/18/2014   CO2 24 01/18/2014    Studies:  Dg Chest Port 1 View  01/18/2014   CLINICAL DATA:  Status post CABG and aortic valve replacement.  EXAM: PORTABLE CHEST - 1 VIEW  COMPARISON:  Portable chest x-ray of January 17, 2014  FINDINGS: The right lung is well-expanded and clear. On the left there is retrocardiac subsegmental atelectasis or early  infiltrate. The tiny left apical pneumothorax is not evident today. The cardiac silhouette is top-normal in size. The pulmonary vascularity is not engorged. The prosthetic aortic valve is visible. There are 7 intact sternal wires.  The trachea and esophagus have been extubated. The Swan-Ganz catheter tip lies in the region of the proximal right main pulmonary artery. A left-sided chest tube is unchanged with the tip projecting over the posterior medial aspect of the sixth rib. The mediastinal drain is unchanged with the tip projecting over the T5 region. A second lower chest tube -mediastinal drain on the left is stable with the tip lying in the medial costophrenic gutter.  IMPRESSION: Interval extubation of the trachea and esophagus. The support tubes and lines are in appropriate position. No significant pneumothorax is demonstrated today. There is left lower lobe atelectasis and/or pneumonia that is slightly more conspicuous today.   Electronically Signed   By: David  Martinique   On: 01/18/2014 08:15   Dg Chest Port 1 View  01/17/2014   CLINICAL DATA:  Post aortic valve replacement.  EXAM: PORTABLE CHEST - 1 VIEW  COMPARISON:  01/11/2014  FINDINGS: Changes of aortic valve replacement. Endotracheal tube is approximately 4 cm above the carina. Left chest tube is in place. Tiny left apical pneumothorax suspected. Swan-Ganz catheter tip is in the main pulmonary artery. NG tube is in the stomach.  Heart is normal size.  Minimal left base atelectasis.  No effusions.  IMPRESSION: Postoperative changes. Tiny left apical pneumothorax. Support devices in  expected position.   Electronically Signed   By: Rolm Baptise M.D.   On: 01/17/2014 14:45    Assessment & Plan:   Chronic Lymphcoytic Leukemia (CLL)  On observation only  Admission WBC was 14,800 This is not very high. Clinically, she is at RAI stage IV but asymptomatic She does not need urgent treatment for this.  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  She had 1 unit of platelets on 1/8 prior to cardiac catheterization and perioperatively on 01/17/2014  Current value is 76,000. She was placed on prednisone, discontinued by primary service Additional workup was ordered including liver function tests is normal. Serum vitamin B-12 is greater than 2000. Fibrinogen level is normal at 309. She was started on vitamin P-00 and folic acid on 1/6. 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  On observation only  Syncopal Episode Valvular heart disease In the setting of irregular heart beat, orthostatic hypotension andsevere aortic stenosis 2 D echo remarkable for critical aortic, bicuspid and aortic valve stenosis, EF 60-65 percent, Grade 1 Diastolic dysfunction .  After platelet transfusion, she underwent cardiac catheterization via radial approach to minimize bleeding issues on 1/9, anticipating valve replacement surgery. This showed CAD including 70% distal left main stenosis extending into the ostial LAD, 80% first diagonal stenosis, 70% second diagonal stenosis, and 70% obtuse marginal stenosis. LVEF 60-65%.  Thoracic Surgery performed CABG and Aortic Valve Replacement on 01/17/14 (Dr. Cyndia Bent) Transfuse platelets prn. OK to give antiplatelet/anticoagulation as long as platelet count is >50,000  DVT prophylaxis On mechanical devices  Renal insufficiency Chronic, baseline 1.2 Normalized with IV hydration   E Coli UTI Urinalysis positive for nitrite, cultures positive for E Coli,  treated with Rocephin  Full Code Other medical issues as per admitting team   I will continue to follow. She will need long-term follow-up for CLL and chronic thrombocytopenia.  [facility], [place], MD 01/18/2014  8:38 AM

## 2014-01-18 NOTE — Progress Notes (Signed)
CT surgery p.m. Rounds  Patient up in chair today-status post aVR CABG Some intermittent mild confusion Sinus rhythm Good response to morning dose of Lasix, weight up 15 pounds so will redose 20 mg Lasix this p.m. Laboratory values reviewed and are satisfactory

## 2014-01-18 NOTE — Progress Notes (Signed)
1 Day Post-Op Procedure(s) (LRB): CORONARY ARTERY BYPASS GRAFTING (CABG)TIMES FOUR USING LEFT INTERNAL MAMMARY ARTERY AND RIGHT SAPHENOUS VEIN HARVESTED ENDOSCOPICALLY ([name]) AORTIC VALVE REPLACEMENT (AVR) ([name]) TRANSESOPHAGEAL ECHOCARDIOGRAM (TEE) ([name]) Subjective:  No complaints  Objective: Vital signs in last 24 hours: Temp:  [97.9 F (36.6 C)-100 F (37.8 C)] 98.6 F (37 C) (01/13 0700) Pulse Rate:  [81-107] 81 (01/13 0700) Cardiac Rhythm:  [-] Atrial paced (01/13 0600) Resp:  [0-18] 18 (01/13 0700) BP: (66-139)/(22-57) 108/44 mmHg (01/13 0700) SpO2:  [98 %-100 %] 100 % (01/13 0700) FiO2 (%):  [40 %-50 %] 40 % (01/12 1818) Weight:  [53 kg (116 lb 13.5 oz)] 53 kg (116 lb 13.5 oz) (01/13 0500)  Hemodynamic parameters for last 24 hours: PAP: (18-48)/(9-23) 36/22 mmHg CO:  [1.8 L/min-2.4 L/min] 2.4 L/min CI:  [1.3 L/min/m2-1.8 L/min/m2] 1.7 L/min/m2  Intake/Output from previous day: 01/12 0701 - 01/13 0700 In: 6986.1 [I.V.:4661.1; Blood:675; IV Piggyback:1650] Out: 2840 [Urine:1580; Blood:750; Chest Tube:510] Intake/Output this shift:    General appearance: alert and cooperative Neurologic: intact Heart: regular rate and rhythm, S1, S2 normal, no murmur, click, rub or gallop Lungs: clear to auscultation bilaterally Extremities: edema mild Wound: dressings dry  Lab Results:  Recent Labs  01/17/14 2000 01/18/14 0500  WBC 10.1 6.7  HGB 10.1*  9.2* 8.8*  HCT 29.3*  27.0* 25.5*  PLT 142* 76*   BMET:  Recent Labs  01/17/14 0514  01/17/14 2000 01/18/14 0500  NA 136  < > 136 133*  K 4.1  < > 5.6*  5.6* 4.7  CL 105  < > 107 105  CO2 26  --   --  24  GLUCOSE 96  < > 177* 113*  BUN 21  < > 18 16  CREATININE 1.03  < > 1.03  1.00 1.05  CALCIUM 9.2  --   --  8.1*  < > = values in this interval not displayed.  PT/INR:  Recent Labs  01/17/14 1410  LABPROT 18.5*  INR 1.52*   ABG    Component Value Date/Time   PHART 7.366 01/17/2014 2005   HCO3 19.4*  01/17/2014 2005   TCO2 20 01/17/2014 2005   ACIDBASEDEF 5.0* 01/17/2014 2005   O2SAT 94.0 01/17/2014 2005   CBG (last 3)   Recent Labs  01/17/14 1806 01/17/14 2318 01/18/14 0331  GLUCAP 125* 139* 100*   CXR ok  ECG: sinus 73, no acute changes  Assessment/Plan: S/P Procedure(s) (LRB): CORONARY ARTERY BYPASS GRAFTING (CABG)TIMES FOUR USING LEFT INTERNAL MAMMARY ARTERY AND RIGHT SAPHENOUS VEIN HARVESTED ENDOSCOPICALLY ([name]) AORTIC VALVE REPLACEMENT (AVR) ([name]) TRANSESOPHAGEAL ECHOCARDIOGRAM (TEE) ([name])  Hemodynamically stable. HR 70's. Will hold on lopressor today and resume tomorrow if HR and BP remain stable. CLL with chronic anemia and thrombocytopenia. Start iron and observe. ASA 81 mg Mobilize Diuresis d/c tubes/lines Continue foley due to patient in ICU and urinary output monitoring See progression orders   LOS: 7 days    BARTLE,BRYAN K 01/18/2014

## 2014-01-19 ENCOUNTER — Inpatient Hospital Stay (HOSPITAL_COMMUNITY): Payer: Medicare Other

## 2014-01-19 ENCOUNTER — Telehealth: Payer: Self-pay | Admitting: Hematology and Oncology

## 2014-01-19 DIAGNOSIS — D649 Anemia, unspecified: Secondary | ICD-10-CM

## 2014-01-19 LAB — CBC
HEMATOCRIT: 25 % — AB (ref 36.0–46.0)
Hemoglobin: 8.3 g/dL — ABNORMAL LOW (ref 12.0–15.0)
MCH: 30.7 pg (ref 26.0–34.0)
MCHC: 33.2 g/dL (ref 30.0–36.0)
MCV: 92.6 fL (ref 78.0–100.0)
Platelets: 45 10*3/uL — ABNORMAL LOW (ref 150–400)
RBC: 2.7 MIL/uL — ABNORMAL LOW (ref 3.87–5.11)
RDW: 16.8 % — ABNORMAL HIGH (ref 11.5–15.5)
WBC: 13 10*3/uL — AB (ref 4.0–10.5)

## 2014-01-19 LAB — GLUCOSE, CAPILLARY
Glucose-Capillary: 103 mg/dL — ABNORMAL HIGH (ref 70–99)
Glucose-Capillary: 121 mg/dL — ABNORMAL HIGH (ref 70–99)
Glucose-Capillary: 132 mg/dL — ABNORMAL HIGH (ref 70–99)
Glucose-Capillary: 94 mg/dL (ref 70–99)
Glucose-Capillary: 94 mg/dL (ref 70–99)

## 2014-01-19 LAB — BASIC METABOLIC PANEL
Anion gap: 7 (ref 5–15)
BUN: 20 mg/dL (ref 6–23)
CO2: 25 mmol/L (ref 19–32)
Calcium: 8.8 mg/dL (ref 8.4–10.5)
Chloride: 103 mEq/L (ref 96–112)
Creatinine, Ser: 1.26 mg/dL — ABNORMAL HIGH (ref 0.50–1.10)
GFR calc Af Amer: 48 mL/min — ABNORMAL LOW (ref >90)
GFR, EST NON AFRICAN AMERICAN: 41 mL/min — AB (ref >90)
Glucose, Bld: 101 mg/dL — ABNORMAL HIGH (ref 70–99)
POTASSIUM: 4.5 mmol/L (ref 3.5–5.1)
Sodium: 135 mmol/L (ref 135–145)

## 2014-01-19 IMAGING — CR DG CHEST 1V PORT
1 series · 1 of 1 positions shown · non-contrast
Comparison: [DATE].

CLINICAL DATA: Aortic valve replacement.

EXAM:
PORTABLE CHEST - 1 VIEW

[AP]
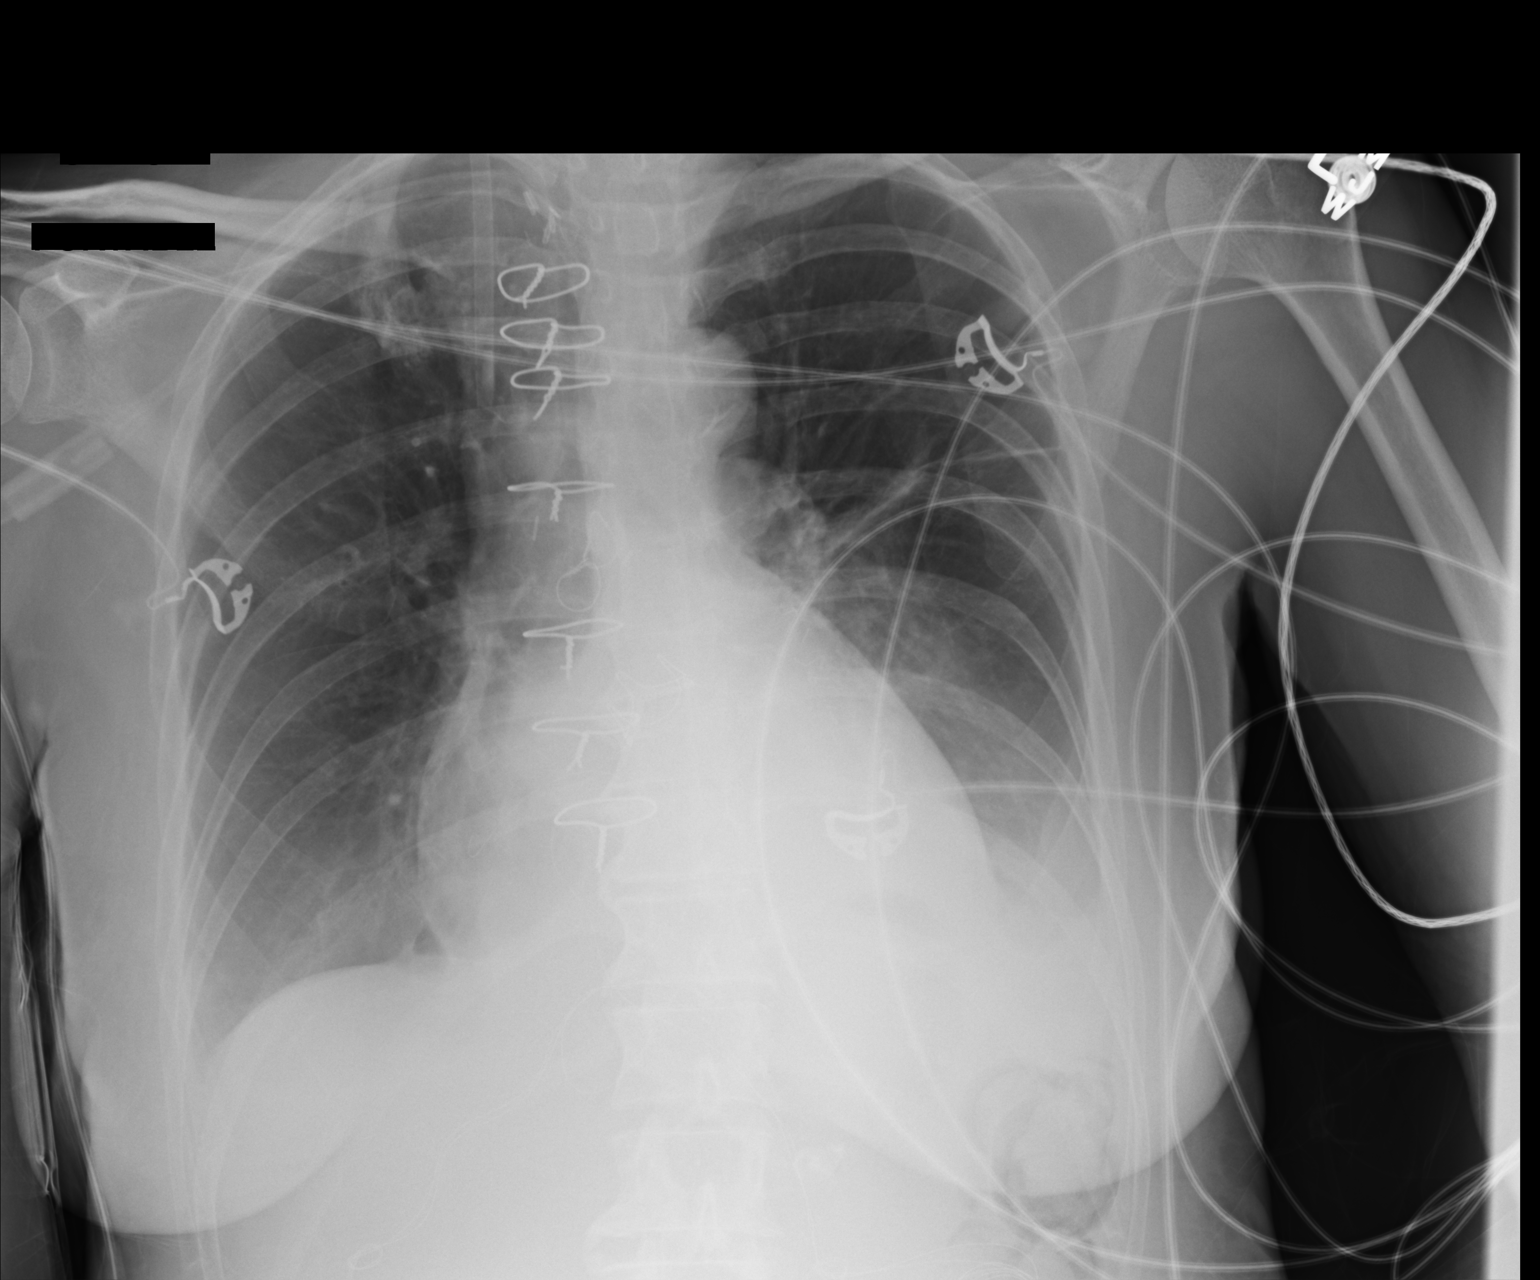

[1 of 1 positions shown; findings below may reference images not displayed]

FINDINGS: Interim removal of Swan-Ganz catheter, mediastinal drainage
catheter, left chest tube. Right IJ she in good anatomic position.
Prior CABG and aortic valve replacement. Stable cardiomegaly.
Bibasilar pulmonary infiltrates and or edema. Small left pleural
effusion. No pneumothorax.
IMPRESSION: 1. Interim removal of Swan-Ganz catheter, mediastinal drainage
catheter, left chest tube. No pneumothorax.
2. Stable cardiomegaly.  Prior CABG and aortic valve replacement.
3. Bibasilar pulmonary infiltrates/edema and small pleural effusion
again noted.

## 2014-01-19 MED ORDER — AMIODARONE HCL IN DEXTROSE 360-4.14 MG/200ML-% IV SOLN
60.0000 mg/h | INTRAVENOUS | Status: DC
  Administered 2014-01-19 (×2): 60 mg/h via INTRAVENOUS
  Filled 2014-01-19: qty 200

## 2014-01-19 MED ORDER — METOPROLOL TARTRATE 12.5 MG HALF TABLET
12.5000 mg | ORAL_TABLET | Freq: Two times a day (BID) | ORAL | Status: DC
  Administered 2014-01-19: 12.5 mg via ORAL
  Filled 2014-01-19 (×2): qty 1

## 2014-01-19 MED ORDER — METOPROLOL TARTRATE 1 MG/ML IV SOLN
5.0000 mg | INTRAVENOUS | Status: AC
  Administered 2014-01-19: 5 mg via INTRAVENOUS

## 2014-01-19 MED ORDER — FUROSEMIDE 40 MG PO TABS
40.0000 mg | ORAL_TABLET | Freq: Every day | ORAL | Status: AC
  Administered 2014-01-20 – 2014-01-22 (×3): 40 mg via ORAL
  Filled 2014-01-19 (×4): qty 1

## 2014-01-19 MED ORDER — POTASSIUM CHLORIDE CRYS ER 20 MEQ PO TBCR
20.0000 meq | EXTENDED_RELEASE_TABLET | Freq: Every day | ORAL | Status: AC
Start: 2014-01-20 — End: 2014-01-22
  Administered 2014-01-20 – 2014-01-22 (×3): 20 meq via ORAL
  Filled 2014-01-19 (×5): qty 1

## 2014-01-19 MED ORDER — METOPROLOL TARTRATE 1 MG/ML IV SOLN
INTRAVENOUS | Status: AC
  Administered 2014-01-19: 5 mg via INTRAVENOUS
  Filled 2014-01-19: qty 5

## 2014-01-19 MED ORDER — AMIODARONE HCL 200 MG PO TABS
200.0000 mg | ORAL_TABLET | Freq: Two times a day (BID) | ORAL | Status: DC
  Administered 2014-01-19 – 2014-01-20 (×4): 200 mg via ORAL
  Filled 2014-01-19 (×7): qty 1

## 2014-01-19 MED ORDER — PREDNISONE 50 MG PO TABS: 60.0000 mg | ORAL_TABLET | Freq: Every day | ORAL | Status: DC

## 2014-01-19 MED ORDER — TRAMADOL HCL 50 MG PO TABS: 50.0000 mg | ORAL_TABLET | ORAL | Status: DC | PRN

## 2014-01-19 MED ORDER — FUROSEMIDE 40 MG PO TABS
40.0000 mg | ORAL_TABLET | Freq: Once | ORAL | Status: AC
  Administered 2014-01-19: 40 mg via ORAL
  Filled 2014-01-19: qty 1

## 2014-01-19 MED ORDER — AMIODARONE LOAD VIA INFUSION
150.0000 mg | Freq: Once | INTRAVENOUS | Status: AC
  Administered 2014-01-19: 150 mg via INTRAVENOUS
  Filled 2014-01-19: qty 83.34

## 2014-01-19 MED ORDER — ACETAMINOPHEN 325 MG PO TABS: 650.0000 mg | ORAL_TABLET | Freq: Four times a day (QID) | ORAL | Status: DC | PRN

## 2014-01-19 MED ORDER — AMIODARONE HCL IN DEXTROSE 360-4.14 MG/200ML-% IV SOLN
30.0000 mg/h | INTRAVENOUS | Status: DC
  Filled 2014-01-19: qty 200

## 2014-01-19 MED ORDER — AMIODARONE HCL IN DEXTROSE 360-4.14 MG/200ML-% IV SOLN
INTRAVENOUS | Status: AC
  Administered 2014-01-19: 60 mg/h via INTRAVENOUS
  Filled 2014-01-19: qty 200

## 2014-01-19 NOTE — Progress Notes (Signed)
2 Days Post-Op Procedure(s) (LRB): CORONARY ARTERY BYPASS GRAFTING (CABG)TIMES FOUR USING LEFT INTERNAL MAMMARY ARTERY AND RIGHT SAPHENOUS VEIN HARVESTED ENDOSCOPICALLY ([name]) AORTIC VALVE REPLACEMENT (AVR) ([name]) TRANSESOPHAGEAL ECHOCARDIOGRAM (TEE) ([name]) Subjective:  No complaints  Objective: Vital signs in last 24 hours: Temp:  [98.1 F (36.7 C)-99.1 F (37.3 C)] 98.7 F (37.1 C) (01/14 0400) Pulse Rate:  [54-154] 60 (01/14 0600) Cardiac Rhythm:  [-] Normal sinus rhythm (01/13 2000) Resp:  [11-20] 11 (01/14 0700) BP: (86-152)/(42-118) 119/54 mmHg (01/14 0700) SpO2:  [85 %-100 %] 90 % (01/14 0600) Weight:  [52.9 kg (116 lb 10 oz)] 52.9 kg (116 lb 10 oz) (01/14 0700)  Hemodynamic parameters for last 24 hours: PAP: (41-47)/(21-23) 47/23 mmHg CO:  [2.6 L/min] 2.6 L/min CI:  [1.9 L/min/m2] 1.9 L/min/m2  Intake/Output from previous day: 01/13 0701 - 01/14 0700 In: 585.5 [I.V.:485.5; IV Piggyback:100] Out: 1345 [Urine:1245; Chest Tube:100] Intake/Output this shift:    General appearance: alert and cooperative Neurologic: intact Heart: irregularly irregular rhythm Lungs: clear to auscultation bilaterally Abdomen: soft, non-tender; bowel sounds normal; no masses,  no organomegaly Extremities: edema mild Wound: dressing dry  Lab Results:  Recent Labs  01/18/14 1734 01/19/14 0400  WBC 10.6* 13.0*  HGB 8.8* 8.3*  HCT 26.1* 25.0*  PLT 64* 45*   BMET:  Recent Labs  01/18/14 0500 01/18/14 1733 01/18/14 1734 01/19/14 0400  NA 133* 134*  --  135  Mercado 4.7 4.2  --  4.5  CL 105 101  --  103  CO2 24  --   --  25  GLUCOSE 113* 120*  --  101*  BUN 16 20  --  20  CREATININE 1.05 1.20* 1.30* 1.26*  CALCIUM 8.1*  --   --  8.8    PT/INR:  Recent Labs  01/17/14 1410  LABPROT 18.5*  INR 1.52*   ABG    Component Value Date/Time   PHART 7.366 01/17/2014 2005   HCO3 19.4* 01/17/2014 2005   TCO2 22 01/18/2014 1733   ACIDBASEDEF 5.0* 01/17/2014 2005   O2SAT 94.0  01/17/2014 2005   CBG (last 3)   Recent Labs  01/18/14 1929 01/18/14 2329 01/19/14 0356  GLUCAP 114* 118* 103*    Assessment/Plan: S/P Procedure(s) (LRB): CORONARY ARTERY BYPASS GRAFTING (CABG)TIMES FOUR USING LEFT INTERNAL MAMMARY ARTERY AND RIGHT SAPHENOUS VEIN HARVESTED ENDOSCOPICALLY ([name]) AORTIC VALVE REPLACEMENT (AVR) ([name]) TRANSESOPHAGEAL ECHOCARDIOGRAM (TEE) ([name]) Hemodynamically stable  Postop atrial fibrillation this am with RVR. Now rate controlled on amio. Will continue amio IV for now until she converts and start low dose lopressor.  Volume excess: wt is 17 lbs over preop. Continue diuretic.  CLL with chronic anemia and thrombocytopenia: Hematology following.  Transfer to 2W circle and mobilize.   LOS: 8 days    Nicole Mercado 01/19/2014

## 2014-01-19 NOTE — Evaluation (Signed)
Physical Therapy Evaluation Patient Details Name: Nicole Mercado MRN: [id number] DOB: [birth year] Today's Date: 01/19/2014   History of Present Illness  Nicole Mercado  is a 74 y.o. female, with a pmh of leukemia, thrombocytopenia, aortic stenosis, and carotid artery disease.  Admitted with syncope s/p CABG and AVR 1/12    Clinical Impression  Pt is very pleasant and generally moving well but continues to fatigue easily and will not have 24hr assist at home. Pt educated for sternal precautions 3x during session and only able to state 2 end of session with handout provided. Pt will benefit from continued acute therapy to maximize mobility, function, strength, safety and adherence to precautions to decrease burden of care.     Follow Up Recommendations SNF;Supervision/Assistance - 24 hour    Equipment Recommendations  Rolling walker with 5" wheels;3in1 (PT)    Recommendations for Other Services OT consult     Precautions / Restrictions Precautions Precautions: Fall;Sternal      Mobility  Bed Mobility Overal bed mobility: Needs Assistance Bed Mobility: Sit to Sidelying         Sit to sidelying: Min assist General bed mobility comments: cues for sequence with assist to elevate legs onto bed  Transfers Overall transfer level: Needs assistance   Transfers: Sit to/from Stand Sit to Stand: Min assist         General transfer comment: cues for hand placement and safety with mod assist for reciprocal scooting to edge of chair  Ambulation/Gait Ambulation/Gait assistance: Min guard Ambulation Distance (Feet): 150 Feet Assistive device: Rolling walker (2 wheeled) Gait Pattern/deviations: Step-through pattern;Decreased stride length;Trunk flexed   Gait velocity interpretation: Below normal speed for age/gender General Gait Details: cues for posture and position in RW with very slow gait  Stairs            Wheelchair Mobility    Modified Rankin (Stroke Patients Only)        Balance Overall balance assessment: Needs assistance   Sitting balance-Leahy Scale: Good       Standing balance-Leahy Scale: Fair                               Pertinent Vitals/Pain Pain Assessment: No/denies pain  sats 95% on RA HR 82-92 110/54 before, 111/44 after activity    Home Living Family/patient expects to be discharged to:: Private residence Living Arrangements: Alone Available Help at Discharge: Friend(s);Available PRN/intermittently Type of Home: Apartment Home Access: Level entry     Home Layout: One level Home Equipment: None      Prior Function Level of Independence: Independent               Hand Dominance        Extremity/Trunk Assessment   Upper Extremity Assessment: Generalized weakness           Lower Extremity Assessment: Generalized weakness      Cervical / Trunk Assessment: Normal  Communication   Communication: No difficulties  Cognition Arousal/Alertness: Awake/alert Behavior During Therapy: WFL for tasks assessed/performed Overall Cognitive Status: Within Functional Limits for tasks assessed                      General Comments      Exercises        Assessment/Plan    PT Assessment Patient needs continued PT services  PT Diagnosis Difficulty walking;Generalized weakness   PT Problem List Decreased activity tolerance;Decreased balance;Decreased  mobility;Decreased knowledge of precautions;Cardiopulmonary status limiting activity;Decreased knowledge of use of DME  PT Treatment Interventions DME instruction;Gait training;Functional mobility training;Therapeutic activities;Therapeutic exercise;Balance training;Neuromuscular re-education;Patient/family education   PT Goals (Current goals can be found in the Care Plan section) Acute Rehab PT Goals Patient Stated Goal: Go home PT Goal Formulation: With patient Time For Goal Achievement: 02/02/14 Potential to Achieve Goals: Good     Frequency Min 3X/week   Barriers to discharge Decreased caregiver support      Co-evaluation               End of Session   Activity Tolerance: Patient tolerated treatment well Patient left: in bed;with call bell/phone within reach;with family/visitor present Nurse Communication: Mobility status;Precautions         Time: 6389-3734 PT Time Calculation (min) (ACUTE ONLY): 19 min   Charges:   PT Evaluation $Initial PT Evaluation Tier I: 1 Procedure PT Treatments $Gait Training: 8-22 mins   PT G CodesMelford Aase 01/19/2014, 11:31 AM Elwyn Reach, [place]

## 2014-01-19 NOTE — Clinical Social Work Placement (Signed)
Clinical Social Work Department CLINICAL SOCIAL WORK PLACEMENT NOTE 01/19/2014  Patient:  Nicole Mercado, Nicole Mercado  Account Number:  [id number] Admit date:  01/11/2014  Clinical Social Worker:  Keil Pickering, LCSWA  Date/time:  01/19/2014 11:40 AM  Clinical Social Work is seeking post-discharge placement for this patient at the following level of care:   SKILLED NURSING   (*CSW will update this form in Epic as items are completed)   01/18/2014  Patient/family provided with [facility] Department of Clinical Social Work's list of facilities offering this level of care within the geographic area requested by the patient (or if unable, by the patient's family).  01/18/2014  Patient/family informed of their freedom to choose among providers that offer the needed level of care, that participate in Medicare, Medicaid or managed care program needed by the patient, have an available bed and are willing to accept the patient.  01/18/2014  Patient/family informed of [facility]' ownership interest in [facility], as well as of the fact that they are under no obligation to receive care at this facility.  PASARR submitted to EDS on 01/18/2014 PASARR number received on 01/18/2014  FL2 transmitted to all facilities in geographic area requested by pt/family on  01/19/2014 FL2 transmitted to all facilities within larger geographic area on 01/19/2014  Patient informed that his/her managed care company has contracts with or will negotiate with  certain facilities, including the following:     Patient/family informed of bed offers received:   Patient chooses bed at  Physician recommends and patient chooses bed at    Patient to be transferred to  on   Patient to be transferred to facility by  Patient and family notified of transfer on  Name of family member notified:    The following physician request were entered in Epic:  Please sign FL2 on chart  Additional Comments:  [name]. [place],  MSW, [place] 01/19/2014 11:42 AM

## 2014-01-19 NOTE — Progress Notes (Signed)
TCTS DAILY ICU PROGRESS NOTE                   [place]            [place] [address]          [phone]   2 Days Post-Op Procedure(s) (LRB): CORONARY ARTERY BYPASS GRAFTING (CABG)TIMES FOUR USING LEFT INTERNAL MAMMARY ARTERY AND RIGHT SAPHENOUS VEIN HARVESTED ENDOSCOPICALLY ([name]) AORTIC VALVE REPLACEMENT (AVR) ([name]) TRANSESOPHAGEAL ECHOCARDIOGRAM (TEE) ([name])  Total Length of Stay:  LOS: 8 days   Subjective: Patient awake and alert, no real complaints.  Objective: Vital signs in last 24 hours: Temp:  [98.1 F (36.7 C)-99.1 F (37.3 C)] 98.7 F (37.1 C) (01/14 0400) Pulse Rate:  [54-154] 60 (01/14 0600) Cardiac Rhythm:  [-] Normal sinus rhythm (01/13 2000) Resp:  [11-20] 11 (01/14 0700) BP: (86-152)/(42-118) 119/54 mmHg (01/14 0700) SpO2:  [85 %-100 %] 90 % (01/14 0600) Weight:  [116 lb 10 oz (52.9 kg)] 116 lb 10 oz (52.9 kg) (01/14 0700)  Filed Weights   01/14/14 0615 01/18/14 0500 01/19/14 0700  Weight: 99 lb 12.8 oz (45.269 kg) 116 lb 13.5 oz (53 kg) 116 lb 10 oz (52.9 kg)    Weight change: -3.5 oz (-0.1 kg)   Hemodynamic parameters for last 24 hours: PAP: (37-47)/(19-23) 47/23 mmHg CO:  [2.6 L/min] 2.6 L/min CI:  [1.9 L/min/m2] 1.9 L/min/m2  Intake/Output from previous day: 01/13 0701 - 01/14 0700 In: 585.5 [I.V.:485.5; IV Piggyback:100] Out: 8546 [Urine:1245; Chest Tube:100]     Current Meds: Scheduled Meds: . acetaminophen  1,000 mg Oral 4 times per day   Or  . acetaminophen (TYLENOL) oral liquid 160 mg/5 mL  1,000 mg Per Tube 4 times per day  . antiseptic oral rinse  7 mL Mouth Rinse q12n4p  . aspirin EC  81 mg Oral Daily   Or  . aspirin  81 mg Per Tube Daily  . bisacodyl  10 mg Oral Daily   Or  . bisacodyl  10 mg Rectal Daily  . chlorhexidine  15 mL Mouth Rinse BID  . docusate sodium  200 mg Oral Daily  . furosemide  20 mg Intravenous Once  . insulin aspart  0-24 Units Subcutaneous 6 times per day  . pantoprazole  40 mg  Oral Daily  . sodium chloride  3 mL Intravenous Q12H   Continuous Infusions: . sodium chloride 20 mL/hr (01/17/14 1400)  . sodium chloride 250 mL (01/18/14 0500)  . sodium chloride 20 mL (01/17/14 1400)  . amiodarone 60 mg/hr (01/19/14 0600)   Followed by  . amiodarone    . lactated ringers 10 mL/hr at 01/19/14 0600  . nitroGLYCERIN Stopped (01/17/14 1440)  . phenylephrine (NEO-SYNEPHRINE) Adult infusion Stopped (01/18/14 0100)   PRN Meds:.morphine injection, ondansetron (ZOFRAN) IV, oxyCODONE, sodium chloride, traMADol  General appearance: alert, cooperative and no distress Heart: regular rate and rhythm Lungs: Slighlty decreased at bases Abdomen: soft, non-tender; bowel sounds normal; no masses,  no organomegaly Extremities: Minor LE edema;ecchymosis right thigh Wound: Dressings taken down and all wounds are clean, dry, and no signs of infection  Lab Results: CBC: Recent Labs  01/18/14 1734 01/19/14 0400  WBC 10.6* 13.0*  HGB 8.8* 8.3*  HCT 26.1* 25.0*  PLT 64* 45*   BMET:  Recent Labs  01/18/14 0500 01/18/14 1733 01/18/14 1734 01/19/14 0400  NA 133* 134*  --  135  K 4.7 4.2  --  4.5  CL 105  101  --  103  CO2 24  --   --  25  GLUCOSE 113* 120*  --  101*  BUN 16 20  --  20  CREATININE 1.05 1.20* 1.30* 1.26*  CALCIUM 8.1*  --   --  8.8    PT/INR:  Recent Labs  01/17/14 1410  LABPROT 18.5*  INR 1.52*   Radiology: Dg Chest Port 1 View  01/18/2014   CLINICAL DATA:  Status post CABG and aortic valve replacement.  EXAM: PORTABLE CHEST - 1 VIEW  COMPARISON:  Portable chest x-ray of January 17, 2014  FINDINGS: The right lung is well-expanded and clear. On the left there is retrocardiac subsegmental atelectasis or early infiltrate. The tiny left apical pneumothorax is not evident today. The cardiac silhouette is top-normal in size. The pulmonary vascularity is not engorged. The prosthetic aortic valve is visible. There are 7 intact sternal wires.  The trachea and  esophagus have been extubated. The Swan-Ganz catheter tip lies in the region of the proximal right main pulmonary artery. A left-sided chest tube is unchanged with the tip projecting over the posterior medial aspect of the sixth rib. The mediastinal drain is unchanged with the tip projecting over the T5 region. A second lower chest tube -mediastinal drain on the left is stable with the tip lying in the medial costophrenic gutter.  IMPRESSION: Interval extubation of the trachea and esophagus. The support tubes and lines are in appropriate position. No significant pneumothorax is demonstrated today. There is left lower lobe atelectasis and/or pneumonia that is slightly more conspicuous today.   Electronically Signed   By: David  Martinique   On: 01/18/2014 08:15   Dg Chest Port 1 View  01/17/2014   CLINICAL DATA:  Post aortic valve replacement.  EXAM: PORTABLE CHEST - 1 VIEW  COMPARISON:  01/11/2014  FINDINGS: Changes of aortic valve replacement. Endotracheal tube is approximately 4 cm above the carina. Left chest tube is in place. Tiny left apical pneumothorax suspected. Swan-Ganz catheter tip is in the main pulmonary artery. NG tube is in the stomach.  Heart is normal size.  Minimal left base atelectasis.  No effusions.  IMPRESSION: Postoperative changes. Tiny left apical pneumothorax. Support devices in expected position.   Electronically Signed   By: Rolm Baptise M.D.   On: 01/17/2014 14:45    Assessment/Plan: S/P Procedure(s) (LRB): CORONARY ARTERY BYPASS GRAFTING (CABG)TIMES FOUR USING LEFT INTERNAL MAMMARY ARTERY AND RIGHT SAPHENOUS VEIN HARVESTED ENDOSCOPICALLY ([name]) AORTIC VALVE REPLACEMENT (AVR) ([name]) TRANSESOPHAGEAL ECHOCARDIOGRAM (TEE) ([name])  1.CV-Previous a fib with RVR. SR in the 80-90's this am. On Amiodarone drip. Will start low dose Lopressor. Hope to stop Amiodarone drip and start oral soon. 2.Pulmonary-On 2 liters of oxygen via [place]-will wean as tolerates. CXR appears to show patient slightly  rotated to the right, small effusions. Encourage incentive spirometer 3. Anemia- H and H 8.3 and 25. Continue oral iron. 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 4. Volume Overload-give Lasix 40 this am 5. Thrombocytopenia-platelets down to 45,000. Per oncologist, baseline platelets around 70-77,000. Not on Lovenox, only on baby ecasa. Patient has history of CLL 6. Will discuss with Dr. Cyndia Bent if to remain in ICU or transfer to [facility] 7.CBGs 114/118/103. Will stop accu checks and SS once transferred from ICU as she is not diabetic.  Nani Skillern PA-C 01/19/2014 7:47 AM

## 2014-01-19 NOTE — Progress Notes (Signed)
POD # 2 AVR/ CABG  BP 112/54 mmHg  Pulse 80  Temp(Src) 97.6 F (36.4 C) (Oral)  Resp 15  Ht 5' (1.524 m)  Wt 116 lb 10 oz (52.9 kg)  BMI 22.78 kg/m2  SpO2 98%   Intake/Output Summary (Last 24 hours) at 01/19/14 1703 Last data filed at 01/19/14 1400  Gross per 24 hour  Intake 658.74 ml  Output    585 ml  Net  73.74 ml    Currently atrially paced at 80 bpm

## 2014-01-19 NOTE — Progress Notes (Signed)
MARGEAUX SWANTEK   DOB:[birth year]   JS#:[id number]    Subjective: She feels well. Complained of mild weakness. She is day 2 status post bypass surgery and valvular heart replacement.  She received perioperative transfusion. The patient denies any recent signs or symptoms of bleeding such as spontaneous epistaxis, hematuria or hematochezia.  Objective:  Filed Vitals:   01/19/14 1000  BP: 110/54  Pulse:   Temp:   Resp: 16     Intake/Output Summary (Last 24 hours) at 01/19/14 1021 Last data filed at 01/19/14 1000  Gross per 24 hour  Intake 578.74 ml  Output   1270 ml  Net -691.26 ml    GENERAL:alert, no distress and comfortable SKIN: skin color is pale, texture, turgor are normal, no rashes or significant lesions EYES: normal, Conjunctiva are pale and non-injected, sclera clear OROPHARYNX:no exudate, no erythema and lips, buccal mucosa, and tongue normal  NECK: supple, thyroid normal size, non-tender, without nodularity LYMPH:  no palpable lymphadenopathy in the cervical, axillary or inguinal LUNGS: clear to auscultation and percussion with normal breathing effort HEART: regular rate & rhythm and no murmurs and no lower extremity edema ABDOMEN:abdomen soft, non-tender and normal bowel sounds Musculoskeletal:no cyanosis of digits and no clubbing  NEURO: alert & oriented x 3 with fluent speech, no focal motor/sensory deficits   Labs:  Lab Results  Component Value Date   WBC 13.0* 01/19/2014   HGB 8.3* 01/19/2014   HCT 25.0* 01/19/2014   MCV 92.6 01/19/2014   PLT 45* 01/19/2014   NEUTROABS 2.5 01/11/2014    Lab Results  Component Value Date   NA 135 01/19/2014   K 4.5 01/19/2014   CL 103 01/19/2014   CO2 25 01/19/2014    Studies:  Dg Chest Port 1 View  01/19/2014   CLINICAL DATA:  Aortic valve replacement.  EXAM: PORTABLE CHEST - 1 VIEW  COMPARISON:  01/18/2014.  FINDINGS: Interim removal of Swan-Ganz catheter, mediastinal drainage catheter, left chest tube. Right IJ  she in good anatomic position. Prior CABG and aortic valve replacement. Stable cardiomegaly. Bibasilar pulmonary infiltrates and or edema. Small left pleural effusion. No pneumothorax.  IMPRESSION: 1. Interim removal of Swan-Ganz catheter, mediastinal drainage catheter, left chest tube. No pneumothorax. 2. Stable cardiomegaly.  Prior CABG and aortic valve replacement. 3. Bibasilar pulmonary infiltrates/edema and small pleural effusion again noted.   Electronically Signed   By: Marcello Moores  Register   On: 01/19/2014 08:05   Dg Chest Port 1 View  01/18/2014   CLINICAL DATA:  Status post CABG and aortic valve replacement.  EXAM: PORTABLE CHEST - 1 VIEW  COMPARISON:  Portable chest x-ray of January 17, 2014  FINDINGS: The right lung is well-expanded and clear. On the left there is retrocardiac subsegmental atelectasis or early infiltrate. The tiny left apical pneumothorax is not evident today. The cardiac silhouette is top-normal in size. The pulmonary vascularity is not engorged. The prosthetic aortic valve is visible. There are 7 intact sternal wires.  The trachea and esophagus have been extubated. The Swan-Ganz catheter tip lies in the region of the proximal right main pulmonary artery. A left-sided chest tube is unchanged with the tip projecting over the posterior medial aspect of the sixth rib. The mediastinal drain is unchanged with the tip projecting over the T5 region. A second lower chest tube -mediastinal drain on the left is stable with the tip lying in the medial costophrenic gutter.  IMPRESSION: Interval extubation of the trachea and esophagus. The support tubes and lines are  in appropriate position. No significant pneumothorax is demonstrated today. There is left lower lobe atelectasis and/or pneumonia that is slightly more conspicuous today.   Electronically Signed   By: David  Martinique   On: 01/18/2014 08:15   Dg Chest Port 1 View  01/17/2014   CLINICAL DATA:  Post aortic valve replacement.  EXAM:  PORTABLE CHEST - 1 VIEW  COMPARISON:  01/11/2014  FINDINGS: Changes of aortic valve replacement. Endotracheal tube is approximately 4 cm above the carina. Left chest tube is in place. Tiny left apical pneumothorax suspected. Swan-Ganz catheter tip is in the main pulmonary artery. NG tube is in the stomach.  Heart is normal size.  Minimal left base atelectasis.  No effusions.  IMPRESSION: Postoperative changes. Tiny left apical pneumothorax. Support devices in expected position.   Electronically Signed   By: Rolm Baptise M.D.   On: 01/17/2014 14:45    Assessment & Plan:   Chronic Lymphcoytic Leukemia (CLL)  On observation only  Admission WBC was 14,800 This is not very high. Clinically, she is at RAI stage IV but asymptomatic She does not need urgent treatment for this.  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 She had 1 unit of platelets on 1/8 prior to cardiac catheterization and perioperatively on 01/17/2014  Additional workup was ordered including liver function tests is normal. Serum vitamin B-12 is greater than 2000. Fibrinogen level is normal at 309. She was started on vitamin M-62 and folic acid on 1/6. Current value is 45,000.  It is acceptable for her to be placed on anticoagulation therapy/antiplatelet agents as long as her platelet count is well above 20,000.  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  On observation only  Syncopal Episode Valvular heart disease In the setting of irregular heart beat, orthostatic hypotension andsevere aortic stenosis 2  D echo remarkable for critical aortic, bicuspid and aortic valve stenosis, EF 60-65 percent, Grade 1 Diastolic dysfunction .  After platelet transfusion, she underwent cardiac catheterization via radial approach to minimize bleeding issues on 1/9, anticipating valve replacement surgery. This showed CAD including 70% distal left main stenosis extending into the ostial LAD, 80% first diagonal stenosis, 70% second diagonal stenosis, and 70% obtuse marginal stenosis. LVEF 60-65%.  Thoracic Surgery performed CABG and Aortic Valve Replacement on 01/17/14 (Dr. Cyndia Bent) Transfuse platelets prn for bleeding. OK to give antiplatelet/anticoagulation as long as platelet count is >20,000  Anemia Multi-factorial. I will defer to primary service to establish transfusion threshold  DVT prophylaxis On mechanical devices  Renal insufficiency Chronic, baseline 1.2 Normalized with IV hydration  Recent E Coli UTI Urinalysis positive for nitrite, cultures positive for E Coli, treated with Rocephin  Full Code Other medical issues as per admitting team   I will continue to follow. She will need long-term follow-up for CLL and chronic thrombocytopenia. I will tentatively see her a month from now on 2/18.    [place], [place], MD 01/19/2014  10:21 AM

## 2014-01-19 NOTE — Telephone Encounter (Signed)
lvm for pt regarding to Feb appt....mailed pt appt sched and letter °

## 2014-01-20 LAB — TYPE AND SCREEN
ABO/RH(D): O POS
Antibody Screen: NEGATIVE
UNIT DIVISION: 0
UNIT DIVISION: 0
UNIT DIVISION: 0
Unit division: 0
Unit division: 0
Unit division: 0

## 2014-01-20 LAB — GLUCOSE, CAPILLARY
GLUCOSE-CAPILLARY: 97 mg/dL (ref 70–99)
Glucose-Capillary: 96 mg/dL (ref 70–99)
Glucose-Capillary: 97 mg/dL (ref 70–99)

## 2014-01-20 LAB — BASIC METABOLIC PANEL
ANION GAP: 9 (ref 5–15)
BUN: 21 mg/dL (ref 6–23)
CO2: 25 mmol/L (ref 19–32)
Calcium: 8.8 mg/dL (ref 8.4–10.5)
Chloride: 98 mEq/L (ref 96–112)
Creatinine, Ser: 1.15 mg/dL — ABNORMAL HIGH (ref 0.50–1.10)
GFR calc Af Amer: 53 mL/min — ABNORMAL LOW (ref >90)
GFR calc non Af Amer: 46 mL/min — ABNORMAL LOW (ref >90)
Glucose, Bld: 104 mg/dL — ABNORMAL HIGH (ref 70–99)
Potassium: 4.1 mmol/L (ref 3.5–5.1)
SODIUM: 132 mmol/L — AB (ref 135–145)

## 2014-01-20 LAB — CBC
HCT: 27.9 % — ABNORMAL LOW (ref 36.0–46.0)
Hemoglobin: 9.6 g/dL — ABNORMAL LOW (ref 12.0–15.0)
MCH: 31.3 pg (ref 26.0–34.0)
MCHC: 34.4 g/dL (ref 30.0–36.0)
MCV: 90.9 fL (ref 78.0–100.0)
Platelets: 31 10*3/uL — ABNORMAL LOW (ref 150–400)
RBC: 3.07 MIL/uL — AB (ref 3.87–5.11)
RDW: 16.3 % — ABNORMAL HIGH (ref 11.5–15.5)
WBC: 22.4 10*3/uL — ABNORMAL HIGH (ref 4.0–10.5)

## 2014-01-20 MED ORDER — ATORVASTATIN CALCIUM 10 MG PO TABS
10.0000 mg | ORAL_TABLET | Freq: Every day | ORAL | Status: DC
  Administered 2014-01-20 – 2014-01-23 (×4): 10 mg via ORAL
  Filled 2014-01-20 (×5): qty 1

## 2014-01-20 MED ORDER — SODIUM CHLORIDE 0.9 % IV SOLN: 250.0000 mL | INTRAVENOUS | Status: DC | PRN

## 2014-01-20 MED ORDER — ONDANSETRON HCL 4 MG PO TABS
4.0000 mg | ORAL_TABLET | Freq: Four times a day (QID) | ORAL | Status: DC | PRN
  Administered 2014-01-20: 4 mg via ORAL
  Filled 2014-01-20: qty 1

## 2014-01-20 MED ORDER — SODIUM CHLORIDE 0.9 % IJ SOLN: 3.0000 mL | INTRAMUSCULAR | Status: DC | PRN

## 2014-01-20 MED ORDER — MOVING RIGHT ALONG BOOK
Freq: Once | Status: AC
Start: 2014-01-20 — End: 2014-01-20
  Administered 2014-01-20: 03:00:00
  Filled 2014-01-20: qty 1

## 2014-01-20 MED ORDER — METOPROLOL TARTRATE 1 MG/ML IV SOLN
5.0000 mg | Freq: Once | INTRAVENOUS | Status: AC
  Administered 2014-01-21: 5 mg via INTRAVENOUS

## 2014-01-20 MED ORDER — OXYCODONE HCL 5 MG PO TABS
5.0000 mg | ORAL_TABLET | ORAL | Status: DC | PRN
  Administered 2014-01-20 – 2014-01-24 (×8): 10 mg via ORAL
  Filled 2014-01-20 (×8): qty 2

## 2014-01-20 MED ORDER — BISACODYL 5 MG PO TBEC
10.0000 mg | DELAYED_RELEASE_TABLET | Freq: Every day | ORAL | Status: DC | PRN
  Administered 2014-01-22 – 2014-01-23 (×2): 10 mg via ORAL
  Filled 2014-01-20 (×4): qty 2

## 2014-01-20 MED ORDER — ASPIRIN EC 81 MG PO TBEC
81.0000 mg | DELAYED_RELEASE_TABLET | Freq: Every day | ORAL | Status: DC
  Administered 2014-01-20 – 2014-01-21 (×2): 81 mg via ORAL
  Filled 2014-01-20 (×3): qty 1

## 2014-01-20 MED ORDER — BISACODYL 10 MG RE SUPP: 10.0000 mg | Freq: Every day | RECTAL | Status: DC | PRN

## 2014-01-20 MED ORDER — DOCUSATE SODIUM 100 MG PO CAPS
200.0000 mg | ORAL_CAPSULE | Freq: Every day | ORAL | Status: DC
  Administered 2014-01-20 – 2014-01-24 (×5): 200 mg via ORAL
  Filled 2014-01-20 (×6): qty 2

## 2014-01-20 MED ORDER — ONDANSETRON HCL 4 MG/2ML IJ SOLN: 4.0000 mg | Freq: Four times a day (QID) | INTRAMUSCULAR | Status: DC | PRN

## 2014-01-20 MED ORDER — SODIUM CHLORIDE 0.9 % IJ SOLN
3.0000 mL | Freq: Two times a day (BID) | INTRAMUSCULAR | Status: DC
  Administered 2014-01-20 – 2014-01-23 (×7): 3 mL via INTRAVENOUS

## 2014-01-20 MED ORDER — PANTOPRAZOLE SODIUM 40 MG PO TBEC
40.0000 mg | DELAYED_RELEASE_TABLET | Freq: Every day | ORAL | Status: DC
  Administered 2014-01-20 – 2014-01-24 (×5): 40 mg via ORAL
  Filled 2014-01-20 (×5): qty 1

## 2014-01-20 MED ORDER — METOPROLOL TARTRATE 12.5 MG HALF TABLET
12.5000 mg | ORAL_TABLET | Freq: Two times a day (BID) | ORAL | Status: DC
  Administered 2014-01-20: 12.5 mg via ORAL
  Filled 2014-01-20 (×2): qty 1

## 2014-01-20 NOTE — Progress Notes (Addendum)
T[facility] DAILY ICU PROGRESS NOTE                   [address]            [place] [address]          [phone]   3 Days Post-Op Procedure(s) (LRB): CORONARY ARTERY BYPASS GRAFTING (CABG)TIMES FOUR USING LEFT INTERNAL MAMMARY ARTERY AND RIGHT SAPHENOUS VEIN HARVESTED ENDOSCOPICALLY ([name]) AORTIC VALVE REPLACEMENT (AVR) ([name]) TRANSESOPHAGEAL ECHOCARDIOGRAM (TEE) ([name])  Total Length of Stay:  LOS: 9 days   Subjective: Patient awake and alert, no real complaints.  Objective: Vital signs in last 24 hours: Temp:  [97.3 F (36.3 C)-98.3 F (36.8 C)] 98.2 F (36.8 C) (01/15 0400) Pulse Rate:  [70-111] 71 (01/15 0040) Cardiac Rhythm:  [-] Atrial paced (01/15 0000) Resp:  [11-20] 11 (01/15 0520) BP: (91-148)/(49-69) 148/49 mmHg (01/15 0520) SpO2:  [93 %-98 %] 95 % (01/15 0400) Weight:  [115 lb 11.9 oz (52.5 kg)] 115 lb 11.9 oz (52.5 kg) (01/15 0200)  Filed Weights   01/18/14 0500 01/19/14 0700 01/20/14 0200  Weight: 116 lb 13.5 oz (53 kg) 116 lb 10 oz (52.9 kg) 115 lb 11.9 oz (52.5 kg)   Weight change: -14.1 oz (-0.4 kg)      Intake/Output from previous day: 01/14 0701 - 01/15 0700 In: 429.9 [P.O.:180; I.V.:249.9] Out: 1290 [Urine:1290]     Current Meds: Scheduled Meds: . amiodarone  200 mg Oral BID  . aspirin EC  81 mg Oral Daily  . docusate sodium  200 mg Oral Daily  . furosemide  40 mg Oral Daily  . metoprolol tartrate  12.5 mg Oral BID  . pantoprazole  40 mg Oral QAC breakfast  . potassium chloride  20 mEq Oral Daily  . sodium chloride  3 mL Intravenous Q12H   Continuous Infusions:   PRN Meds:.sodium chloride, acetaminophen, bisacodyl **OR** bisacodyl, ondansetron **OR** ondansetron (ZOFRAN) IV, oxyCODONE, sodium chloride, traMADol  General appearance: alert, cooperative and no distress Heart: regular rate and rhythm Lungs: Slighlty decreased at bases Abdomen: soft, non-tender; bowel sounds normal; no masses,  no organomegaly Extremities:  Minor LE edema;ecchymosis right thigh Wound: Dressings taken down and all wounds are clean, dry, and no signs of infection  Lab Results: CBC:  Recent Labs  01/19/14 0400 01/20/14 0316  WBC 13.0* 22.4*  HGB 8.3* 9.6*  HCT 25.0* 27.9*  PLT 45* 31*   BMET:   Recent Labs  01/19/14 0400 01/20/14 0316  NA 135 132*  K 4.5 4.1  CL 103 98  CO2 25 25  GLUCOSE 101* 104*  BUN 20 21  CREATININE 1.26* 1.15*  CALCIUM 8.8 8.8    PT/INR:   Recent Labs  01/17/14 1410  LABPROT 18.5*  INR 1.52*   Radiology: Dg Chest Port 1 View  01/19/2014   CLINICAL DATA:  Aortic valve replacement.  EXAM: PORTABLE CHEST - 1 VIEW  COMPARISON:  01/18/2014.  FINDINGS: Interim removal of Swan-Ganz catheter, mediastinal drainage catheter, left chest tube. Right IJ she in good anatomic position. Prior CABG and aortic valve replacement. Stable cardiomegaly. Bibasilar pulmonary infiltrates and or edema. Small left pleural effusion. No pneumothorax.  IMPRESSION: 1. Interim removal of Swan-Ganz catheter, mediastinal drainage catheter, left chest tube. No pneumothorax. 2. Stable cardiomegaly.  Prior CABG and aortic valve replacement. 3. Bibasilar pulmonary infiltrates/edema and small pleural effusion again noted.   Electronically Signed   By: Marcello Moores  Register   On: 01/19/2014 08:05  Assessment/Plan: S/P Procedure(s) (LRB): CORONARY ARTERY BYPASS GRAFTING (CABG)TIMES FOUR USING LEFT INTERNAL MAMMARY ARTERY AND RIGHT SAPHENOUS VEIN HARVESTED ENDOSCOPICALLY ([name]) AORTIC VALVE REPLACEMENT (AVR) ([name]) TRANSESOPHAGEAL ECHOCARDIOGRAM (TEE) ([name])  1.CV-Previous a fib with RVR. SR in the 70's this am. On back up pacer but she is over riding it. Will likely disconnect.On Amiodarone 200 bid, Lopressor 12.5 bid. 2..Pulmonary-On room air. Encourage incentive spirometer 3. Anemia- H and H 8.3 and 25. Continue oral iron. 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 4. Volume Overload-continue Lasix 40 this am 5.  Thrombocytopenia-platelets down to 31,000. Per oncologist, baseline platelets around 70-77,000. Not on Lovenox, only on baby ecasa. Patient has history of CLL 7.CBGs 94/94/96. Will stop accu checks and SS once transferred from ICU as she is not diabetic. 8.WBC increased from 13 to 22.4 9. Transfer to 2000 once bed available  Arnoldo Lenis 01/20/2014 7:27 AM   Chart reviewed, patient examined, agree with above. She is maintaining sinus rhythm in the 70's on amiodarone 200 bid. I stopped her lopressor yesterday because after she converted back to sinus from atrial fib her HR was in the 30's. For some reason it got reordered overnight under my name but I did not order it. Will continue amiodarone for now and hold off on lopressor. Continue diuresis. She was on lisinopril preop and will probably need to get back on that once diuresed. Leukocytosis is due to CLL I think. Platelets down to 31K but no bleeding. She can be treated with Prednisone if oncology feels it is necessary.

## 2014-01-20 NOTE — Discharge Summary (Signed)
Physician Discharge Summary       [address]       [place] [address]             [phone]    Patient ID: Nicole Mercado MRN: [id number] DOB/AGE: July 27, 1940 74 y.o.  Admit date: 01/11/2014 Discharge date: 01/24/2014  Admission Diagnoses: 1. Coronary artery disease 2. Severe aortic stenosis 3. CLL (chronic lymphocytic leukemia) 4. Thrombocytopenia 5. History of hypertension  6. Chronic anemia (related to CLL) Discharge Diagnoses:  1. Coronary artery disease 2. Severe aortic stenosis 3. CLL (chronic lymphocytic leukemia) 4. Thrombocytopenia 5. History of hypertension  6. Chronic anemia (related to CLL)  Procedure (s):  Cardiac catheterization done by Dr. Irish Lack on 01/13/2014:  ANGIOGRAPHIC DATA: The left main coronary artery is patent proximally. There is a 70% distal left main stenosis. This extends into the ostium of the LAD.   The left anterior descending artery is is a large vessel which wraps around the apex. In the mid vessel, there is a 40-50% stenosis. The first diagonal has a proximal 80% stenosis. The second diagonal has a proximal 70% stenosis..  The left circumflex artery is a large vessel. There is a long first obtuse marginal with an ostial to proximal 70% stenosis. The second obtuse marginal is medium sized and patent. The remainder of the circumflex is patent.  The right coronary artery is a large dominant vessel with a downward takeoff. There is mild disease in the proximal to mid vessel. The posterior descending artery is large and widely patent. The posterior lateral artery is large and widely patent.  LEFT VENTRICULOGRAM: Left ventricular angiogram was not done. LVEDP was not assessed.  IMPRESSIONS:  1. Significant distal left main coronary artery disease. 2. Significant ostial left anterior descending artery disease. Significant disease in the first and second diagonal branches. 3. Patent ostial left circumflex artery.  Significant disease in the proximal high OM1 branch. 4. Mild disease in the coronary artery. 5. Left ventricular systolic function not assessed  6. Median Sternotomy 7. Extracorporeal circulation 8. Coronary artery bypass grafting x 4   Left internal mammary graft to the LAD  SVG to OM1  SVG to D1  SVG to D2 9. Endoscopic vein harvest from the right leg by Dr. Cyndia Bent on 01/17/2014.  History of Presenting Illness: The patient has a several year history of chronic lymphocytic leukemia and chronic thrombocytopenia, hypertension, and severe aortic stenosis that was followed by Dr. Dan Maker prior to his retirement. An echocardiogram obtained in November 2013 showed a trileaflet aortic valve with severe calcification and severe aortic stenosis, EF was greater than 55%. She had a repeat echocardiogram in May 2014 which showed EF 21-30%, grade 1 diastolic dysfunction, aortic valve area 0.49, mild MR, PA peak pressure 41 mmHg. She was seen by Dr. Servando Snare for consideration of surgery but refused at that time and was thinking about having surgery in [place] where her daughter-in-law works as a Marine scientist. She has been very active at home and live independently. She is able to does her own cooking, cleaning, and shopping without any difficulty. She denies prior history of chest pain with or without exertion. She denies prior history of dizziness, headache, presyncope or syncope before October. In October she reports an episode of dizziness after a flu shot that she thought was a reaction. Since then she reports a few episodes of dizziness but has not passed out. Then in the shower on the morning of 01/11/2014 she had sudden onset  of dizziness and passed out. She states she must have passed out for no more than a minute. She woke up on the floor and called her friend who contacted the EMS. On arrival to [facility], her blood pressure was 152/56. O2 saturation 100% on room air. EKG showed atrial  ectopy with frequent PACs. Chest x-ray showed no acute disease. CT of the head showed soft tissue injuries in the right periatal region without acute intracranial abnormality, multilevel degenerative changes in the spine without acute abnormality. Urinalysis shows many bacteria and positive nitrite. Urine culture showed > 100000 Ecoli that is pan-sensitive. She was placed on IV Ceftriaxone on 01/11/2014. A repeat echo on 01/11/2014 shows critical AS with a mean gradient of 61 and a peak of 104. The AVA is 0.45 cm2. The aorta is normal sized. LVEF is 60-65%. Cardiac cath yesterday shows 70% distal LM extending into the ostium of the LAD. D1 has 80% proximal stenosis and D2 has 70% proximal stenosis. The OM1 has 70% ostial stenosis. She has been seen by Heme/Onc and recommendations made. Dr. Cyndia Bent discussed the need for coronary artery bypass grafting surgery and aortic valve replacement.  Potential risks, benefits, and complications were discussed with the patient and she agreed to proceed. Pre operative carotid duplex US showed left CEA to be patent and no significant right internal carotid artery stenosis.She underwent CABG x 4 and AVR on 01/16/2014.  Brief Hospital Course:  The patient was extubated the evening of surgery without difficulty. She remained afebrile and hemodynamically stable. She was initially AAI paced. Gordy Councilman, a line, chest tubes, and foley were removed early in the post operative course. She was volume over loaded and diuresed. She went into atrial fibrillation with RVR on post operative day 2. She was placed on Amiodarone. She converted and remained in sinus rhythm. Lopressor was started. She was weaned off the insulin drip. She has thrombocytopenia and chronic anemia related to her CLL. Oncology followed her post op and recommendations were followed accordingly. She did receive 2 units of platelets as her platelet count did decrease to 18,000. Her last platelet count was up to 87,000. Her  last H and H was 9.1 and 28.5.The patient was felt surgically stable for transfer from the ICU to [facility] for further convalescence on 01/21/2012. She continues to progress with cardiac rehab. She was ambulating on room air. She has been tolerating a diet and has had a bowel movement. Epicardial pacing wires and chest tube sutures will be removed prior to discharge. She will be given lactulose for constipation. She is felt surgically stable for discharge to [facility] on 01/25/2012.  We ask the SNF to please do the following: 1. Please obtain vital signs at least one time daily 2.Please weigh the patient daily. If he or she continues to gain weight or develops lower extremity edema, contact the office at [phone]. 3. Ambulate patient at least three times daily and please use sternal precautions.  Latest Vital Signs: Blood pressure 167/59, pulse 75, temperature 98.4 F (36.9 C), temperature source Oral, resp. rate 18, height 5' (1.524 m), weight 107 lb 2.3 oz (48.6 kg), SpO2 96 %.  Physical Exam: General appearance: alert, cooperative and no distress Heart: regular rate and rhythm Lungs: Slighlty decreased at bases Abdomen: soft, non-tender; bowel sounds normal; no masses, no organomegaly Extremities: Minor LE edema;ecchymosis right thigh Wound: Dressings taken down and all wounds are clean, dry, and no signs of infection  Discharge Condition:Stable  Recent  laboratory studies:  Lab Results  Component Value Date   WBC 14.4* 01/24/2014   HGB 9.1* 01/24/2014   HCT 28.5* 01/24/2014   MCV 97.9 01/24/2014   PLT 87* 01/24/2014   Lab Results  Component Value Date   NA 135 01/21/2014   K 3.7 01/21/2014   CL 95* 01/21/2014   CO2 29 01/21/2014   CREATININE 1.05 01/21/2014   GLUCOSE 129* 01/21/2014      Diagnostic Studies: Ct Head Wo Contrast  01/11/2014   CLINICAL DATA:  Fall in shower this morning with dizziness  EXAM: CT HEAD WITHOUT CONTRAST  CT CERVICAL SPINE WITHOUT CONTRAST   TECHNIQUE: Multidetector CT imaging of the head and cervical spine was performed following the standard protocol without intravenous contrast. Multiplanar CT image reconstructions of the cervical spine were also generated.  COMPARISON:  None  FINDINGS: CT HEAD FINDINGS  The bony calvarium is intact. A scalp hematoma and soft tissue laceration are noted in the right posterior parietal region. Mild atrophic changes are noted. Mild chronic white matter ischemic change is seen. No findings to suggest acute hemorrhage, acute infarction or space-occupying mass lesion are noted.  CT CERVICAL SPINE FINDINGS  Seven cervical segments are well visualized. Vertebral body height is well maintained. Multilevel osteophytic changes are noted as well as facet hypertrophic changes. The hypertrophic facet changes are predominately noted on the left. Calcifications of the carotid and vertebral arteries are noted. No acute fracture or acute facet abnormality is seen.  IMPRESSION: CT of the head: Soft tissue injury in the right parietal region without acute intracranial abnormality.  CT of the cervical spine: Multilevel degenerative changes without acute abnormality. The facet hypertrophic changes are predominantly on the left.   Electronically Signed   By: Inez Catalina M.D.   On: 01/11/2014 10:08   Ct Cervical Spine Wo Contrast  01/11/2014   CLINICAL DATA:  Fall in shower this morning with dizziness  EXAM: CT HEAD WITHOUT CONTRAST  CT CERVICAL SPINE WITHOUT CONTRAST  TECHNIQUE: Multidetector CT imaging of the head and cervical spine was performed following the standard protocol without intravenous contrast. Multiplanar CT image reconstructions of the cervical spine were also generated.  COMPARISON:  None  FINDINGS: CT HEAD FINDINGS  The bony calvarium is intact. A scalp hematoma and soft tissue laceration are noted in the right posterior parietal region. Mild atrophic changes are noted. Mild chronic white matter ischemic change is  seen. No findings to suggest acute hemorrhage, acute infarction or space-occupying mass lesion are noted.  CT CERVICAL SPINE FINDINGS  Seven cervical segments are well visualized. Vertebral body height is well maintained. Multilevel osteophytic changes are noted as well as facet hypertrophic changes. The hypertrophic facet changes are predominately noted on the left. Calcifications of the carotid and vertebral arteries are noted. No acute fracture or acute facet abnormality is seen.  IMPRESSION: CT of the head: Soft tissue injury in the right parietal region without acute intracranial abnormality.  CT of the cervical spine: Multilevel degenerative changes without acute abnormality. The facet hypertrophic changes are predominantly on the left.   Electronically Signed   By: Inez Catalina M.D.   On: 01/11/2014 10:08   Dg Chest Port 1 View  01/19/2014   CLINICAL DATA:  Aortic valve replacement.  EXAM: PORTABLE CHEST - 1 VIEW  COMPARISON:  01/18/2014.  FINDINGS: Interim removal of Swan-Ganz catheter, mediastinal drainage catheter, left chest tube. Right IJ she in good anatomic position. Prior CABG and aortic valve replacement. Stable cardiomegaly.  Bibasilar pulmonary infiltrates and or edema. Small left pleural effusion. No pneumothorax.  IMPRESSION: 1. Interim removal of Swan-Ganz catheter, mediastinal drainage catheter, left chest tube. No pneumothorax. 2. Stable cardiomegaly.  Prior CABG and aortic valve replacement. 3. Bibasilar pulmonary infiltrates/edema and small pleural effusion again noted.   Electronically Signed   By: Marcello Moores  Register   On: 01/19/2014 08:05   Discharge Medications:    Medication List    TAKE these medications        acetaminophen 500 MG tablet  Commonly known as:  TYLENOL  Take 1,000 mg by mouth every 6 (six) hours as needed. pain     amiodarone 200 MG tablet  Commonly known as:  PACERONE  Take 1 tablet (200 mg total) by mouth 2 (two) times daily. For 3 days;then take  Amiodarone 200 mg by mouth daily thereafter     amLODipine 5 MG tablet  Commonly known as:  NORVASC  Take 1 tablet (5 mg total) by mouth daily.     aspirin EC 81 MG tablet  Take 81 mg by mouth daily.     atorvastatin 10 MG tablet  Commonly known as:  LIPITOR  Take 1 tablet (10 mg total) by mouth daily at 6 PM.     Calcium-Vitamin D 500-125 MG-UNIT Tabs  Take 1 tablet by mouth daily.     furosemide 40 MG tablet  Commonly known as:  LASIX  Take 1 tablet (40 mg total) by mouth daily. For 5 days then stop.     lisinopril 20 MG tablet  Commonly known as:  PRINIVIL,ZESTRIL  Take 20 mg by mouth daily.     metoprolol succinate 25 MG 24 hr tablet  Commonly known as:  TOPROL-XL  Take 1 tablet (25 mg total) by mouth daily. Take 1 1/2 tab.Take with or immediately following a meal.     multivitamins ther. w/minerals Tabs tablet  Take 1 tablet by mouth daily.     potassium chloride SA 20 MEQ tablet  Commonly known as:  K-DUR,KLOR-CON  Take 1 tablet (20 mEq total) by mouth daily. For 5 days then stop.     traMADol 50 MG tablet  Commonly known as:  ULTRAM  Take 1-2 tablets (50-100 mg total) by mouth every 4 (four) hours as needed for moderate pain.        The patient has been discharged on:   1.Beta Blocker:  Yes [ x  ]                              No   [   ]                              If No, reason:  2.Ace Inhibitor/ARB: Yes [   ]                                     No  [   x ]                                     If No, reason:  3.Statin:   Yes [  x ]  No  [   ]                  If No, reason:  4.Ecasa:  Yes  [ x  ]                  No   [   ]                  If No, reason:  Follow Up Appointments: Follow-up Information    Follow up with Richardson Dopp, PA-C On 02/06/2014.   Specialty:  Physician Assistant   Why:  Appointment time is at 11:50 am   Contact information:   [address] [address] [phone]        Follow up with Gaye Pollack, MD On 03/01/2014.   Specialty:  Cardiothoracic Surgery   Why:  PA/LAT CXR to be taken at ([facility] which is in the same building as Dr. Vivi Martens office) on 03/01/2014 at 12:30 pm;Appointment time is at 1:30 pm   Contact information:   [address] [place] [address] [phone]       Signed: Lars Pinks MPA-C 01/24/2014, 7:42 AM

## 2014-01-20 NOTE — Progress Notes (Signed)
Nicole Mercado   DOB:October 26, 1940   QJ#:[id number]    Subjective: She feels well. Complained of mild weakness. She is day 3 status post bypass surgery and valvular heart replacement.  She received perioperative transfusion. The patient denies any recent signs or symptoms of bleeding such as spontaneous epistaxis, hematuria or hematochezia.   Objective:  Filed Vitals:   01/20/14 0700  BP:   Pulse:   Temp: 97.8 F (36.6 C)  Resp:      Intake/Output Summary (Last 24 hours) at 01/20/14 0820 Last data filed at 01/20/14 0745  Gross per 24 hour  Intake  386.6 ml  Output   1395 ml  Net -1008.4 ml    GENERAL:alert, no distress and comfortable SKIN: Noted bruising. No petechiae rash EYES: normal, Conjunctiva are pale and non-injected, sclera clear OROPHARYNX:no exudate, no erythema and lips, buccal mucosa, and tongue normal  NECK: supple, thyroid normal size, non-tender, without nodularity LYMPH:  no palpable lymphadenopathy in the cervical, axillary or inguinal LUNGS: clear to auscultation and percussion with normal breathing effort HEART: regular rate & rhythm and no murmurs and no lower extremity edema ABDOMEN:abdomen soft, non-tender and normal bowel sounds Musculoskeletal:no cyanosis of digits and no clubbing  NEURO: alert & oriented x 3 with fluent speech, no focal motor/sensory deficits   Labs:  Lab Results  Component Value Date   WBC 22.4* 01/20/2014   HGB 9.6* 01/20/2014   HCT 27.9* 01/20/2014   MCV 90.9 01/20/2014   PLT 31* 01/20/2014   NEUTROABS 2.5 01/11/2014    Lab Results  Component Value Date   NA 132* 01/20/2014   K 4.1 01/20/2014   CL 98 01/20/2014   CO2 25 01/20/2014    Studies:  Dg Chest Port 1 View  01/19/2014   CLINICAL DATA:  Aortic valve replacement.  EXAM: PORTABLE CHEST - 1 VIEW  COMPARISON:  01/18/2014.  FINDINGS: Interim removal of Swan-Ganz catheter, mediastinal drainage catheter, left chest tube. Right IJ she in good anatomic position. Prior  CABG and aortic valve replacement. Stable cardiomegaly. Bibasilar pulmonary infiltrates and or edema. Small left pleural effusion. No pneumothorax.  IMPRESSION: 1. Interim removal of Swan-Ganz catheter, mediastinal drainage catheter, left chest tube. No pneumothorax. 2. Stable cardiomegaly.  Prior CABG and aortic valve replacement. 3. Bibasilar pulmonary infiltrates/edema and small pleural effusion again noted.   Electronically Signed   By: Marcello Moores  Register   On: 01/19/2014 08:05    Assessment & Plan:   CLL and leukocytosis  I reinforced the importance of treatment for her CLL in the future as I suspect her platelet count will continue to decline. The patient is adamant she does not want any form of chemotherapy. Immunotherapy such as rituximab or Ofatumumab that might be acceptable treatment. 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 She had 1 unit of platelets on 1/8 prior to cardiac catheterization and perioperatively on 01/17/2014  Additional workup was ordered including liver function tests is normal. Serum vitamin B-12 is greater than 2000. Fibrinogen level is  normal at 309. She was started on vitamin M-25 and folic acid on 1/6. Current value is declining, likely related to slow recovery of bone marrow from recent suppression from surgery.  It is acceptable for her to be placed on anticoagulation therapy/antiplatelet agents as long as her platelet  count is well above 20,000.  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  On observation only  Syncopal Episode Valvular heart disease In the setting of irregular heart beat, orthostatic hypotension andsevere aortic stenosis 2 D echo remarkable for critical aortic, bicuspid and aortic valve stenosis, EF 60-65 percent, Grade 1 Diastolic dysfunction .  After platelet transfusion, she underwent cardiac catheterization via radial approach to minimize bleeding issues on 1/9, anticipating valve replacement surgery. This showed CAD including 70% distal left main stenosis extending into the ostial LAD, 80% first diagonal stenosis, 70% second diagonal stenosis, and 70% obtuse marginal stenosis. LVEF 60-65%.  Thoracic Surgery performed CABG and Aortic Valve Replacement on 01/17/14 (Dr. Cyndia Bent) Transfuse platelets prn for bleeding. OK to give antiplatelet/anticoagulation as long as platelet count is >20,000  Anemia Multi-factorial. I will defer to primary service to establish transfusion threshold  DVT prophylaxis On mechanical devices  Renal insufficiency Chronic, baseline 1.2 Normalized with IV hydration  Recent E Coli UTI Urinalysis positive for nitrite, cultures positive for E Coli, treated with Rocephin  Full Code Other medical issues as per admitting team   I will sign off. I will tentatively see her a month from now on 2/18.  Please call if questions arise. [place], [place], MD 01/20/2014  8:20 AM

## 2014-01-20 NOTE — Care Management Note (Signed)
    Page 1 of 2   01/24/2014     12:32:02 PM CARE MANAGEMENT NOTE 01/24/2014  Patient:  Nicole Mercado, Nicole Mercado   Account Number:  [id number]  Date Initiated:  01/17/2014  Documentation initiated by:  [facility]  Subjective/Objective Assessment:   Admitted post syncopal episode with fall and EKG changes.  1-12- post op CABG x4 and AVR.     Action/Plan:   Anticipated DC Date:  01/24/2014   Anticipated DC Plan:  SKILLED NURSING FACILITY  In-house referral  Clinical Social Worker      DC Planning Services  CM consult      Choice offered to / List presented to:             Status of service:  Completed, signed off Medicare Important Message given?  YES (If response is "NO", the following Medicare IM given date fields will be blank) Date Medicare IM given:  01/20/2014 Medicare IM given by:  [facility] Date Additional Medicare IM given:  01/23/2014 Additional Medicare IM given by:  Marvetta Gibbons  Discharge Disposition:  [facility]  Per UR Regulation:  Reviewed for med. necessity/level of care/duration of stay  If discussed at [place] of Stay Meetings, dates discussed:   01/24/2014    Comments:  Contact:  Costner,Travis Friend [phone]  [phone]  01/24/14- [place] RN, BSN [phone] Pt for d/c to SNF today [place] following.  01-19-14  10:45am Luz Lex, RNBSN - [phone] Son, grandaughter and friend are in room with her.  Per conversation with SW and patient - patient really wants to go home.  Talked with patient and she confirms this but states that she will not have anyone with her.  Her grandaughter is here but will only be here 10 days - starting yesterday.  Patient is reluctancly agreeable to go to SNF [facility] - understanding that she needs to have someone with her 24/7 intitially.  PT to see again post surgery. patient is up in a chair now - looks good but states she definitely had assistance getting to the chair.  CM will continue to  follow.  Updated SW.  01-17-14 2:20pm Luz Lex, RNBSN [phone] Lives at home alone.  Plan for SNF - PT has seen prior and recommending SNF.  SW consult placed.

## 2014-01-21 LAB — BASIC METABOLIC PANEL
Anion gap: 11 (ref 5–15)
BUN: 18 mg/dL (ref 6–23)
CO2: 29 mmol/L (ref 19–32)
Calcium: 8.7 mg/dL (ref 8.4–10.5)
Chloride: 95 mEq/L — ABNORMAL LOW (ref 96–112)
Creatinine, Ser: 1.05 mg/dL (ref 0.50–1.10)
GFR calc Af Amer: 60 mL/min — ABNORMAL LOW (ref >90)
GFR calc non Af Amer: 51 mL/min — ABNORMAL LOW (ref >90)
Glucose, Bld: 129 mg/dL — ABNORMAL HIGH (ref 70–99)
Potassium: 3.7 mmol/L (ref 3.5–5.1)
Sodium: 135 mmol/L (ref 135–145)

## 2014-01-21 MED ORDER — AMIODARONE HCL 200 MG PO TABS
200.0000 mg | ORAL_TABLET | Freq: Two times a day (BID) | ORAL | Status: DC
  Administered 2014-01-21 – 2014-01-24 (×6): 200 mg via ORAL
  Filled 2014-01-21 (×7): qty 1

## 2014-01-21 MED ORDER — METOPROLOL TARTRATE 25 MG PO TABS
25.0000 mg | ORAL_TABLET | Freq: Two times a day (BID) | ORAL | Status: DC
  Administered 2014-01-21: 25 mg via ORAL
  Filled 2014-01-21 (×2): qty 1

## 2014-01-21 MED ORDER — METOPROLOL TARTRATE 1 MG/ML IV SOLN
5.0000 mg | Freq: Once | INTRAVENOUS | Status: AC
  Administered 2014-01-21: 5 mg via INTRAVENOUS
  Filled 2014-01-21: qty 5

## 2014-01-21 MED ORDER — AMIODARONE HCL IN DEXTROSE 360-4.14 MG/200ML-% IV SOLN
60.0000 mg/h | INTRAVENOUS | Status: DC
  Administered 2014-01-21: 60 mg/h via INTRAVENOUS
  Filled 2014-01-21: qty 200

## 2014-01-21 MED ORDER — METOPROLOL TARTRATE 1 MG/ML IV SOLN
INTRAVENOUS | Status: AC
  Administered 2014-01-21: 5 mg via INTRAVENOUS
  Filled 2014-01-21: qty 5

## 2014-01-21 MED ORDER — LISINOPRIL 5 MG PO TABS
5.0000 mg | ORAL_TABLET | Freq: Every day | ORAL | Status: DC
  Filled 2014-01-21 (×2): qty 1

## 2014-01-21 MED ORDER — POTASSIUM CHLORIDE CRYS ER 10 MEQ PO TBCR
30.0000 meq | EXTENDED_RELEASE_TABLET | Freq: Once | ORAL | Status: AC
  Administered 2014-01-21: 30 meq via ORAL
  Filled 2014-01-21: qty 1

## 2014-01-21 MED ORDER — METOPROLOL TARTRATE 12.5 MG HALF TABLET
12.5000 mg | ORAL_TABLET | Freq: Two times a day (BID) | ORAL | Status: DC
Start: 2014-01-21 — End: 2014-01-24
  Administered 2014-01-21 – 2014-01-24 (×6): 12.5 mg via ORAL
  Filled 2014-01-21 (×7): qty 1

## 2014-01-21 MED ORDER — AMIODARONE HCL IN DEXTROSE 360-4.14 MG/200ML-% IV SOLN
30.0000 mg/h | INTRAVENOUS | Status: DC
  Filled 2014-01-21 (×2): qty 200

## 2014-01-21 NOTE — Progress Notes (Signed)
Notified MD pt in 2w11 HR now 53.  Will continue to monitor.

## 2014-01-21 NOTE — Progress Notes (Addendum)
      [address]       [place] [address]             [phone]        4 Days Post-Op Procedure(s) (LRB): CORONARY ARTERY BYPASS GRAFTING (CABG)TIMES FOUR USING LEFT INTERNAL MAMMARY ARTERY AND RIGHT SAPHENOUS VEIN HARVESTED ENDOSCOPICALLY ([name]) AORTIC VALVE REPLACEMENT (AVR) ([name]) TRANSESOPHAGEAL ECHOCARDIOGRAM (TEE) ([name])  Subjective: Patient felt heart "racing earlier". No other complaints.  Objective: Vital signs in last 24 hours: Temp:  [97.3 F (36.3 C)-98.3 F (36.8 C)] 97.7 F (36.5 C) (01/16 0539) Pulse Rate:  [89-146] 133 (01/16 0539) Cardiac Rhythm:  [-] Sinus tachycardia (01/16 0230) Resp:  [11-20] 18 (01/16 0539) BP: (110-164)/(50-79) 154/79 mmHg (01/16 0539) SpO2:  [93 %-100 %] 98 % (01/16 0539) Weight:  [111 lb 15.9 oz (50.8 kg)] 111 lb 15.9 oz (50.8 kg) (01/16 0537)  Pre op weight 45 kg Current Weight  01/21/14 111 lb 15.9 oz (50.8 kg)      Intake/Output from previous day: 01/15 0701 - 01/16 0700 In: 720 [P.O.:720] Out: 2300 [Urine:2300]   Physical Exam:  Cardiovascular: IRRR Pulmonary: Clear to auscultation bilaterally; no rales, wheezes, or rhonchi. Abdomen: Soft, non tender, bowel sounds present. Extremities: Mild bilateral lower extremity edema. Wounds: Clean and dry.  No erythema or signs of infection.  Lab Results: CBC: Recent Labs  01/19/14 0400 01/20/14 0316  WBC 13.0* 22.4*  HGB 8.3* 9.6*  HCT 25.0* 27.9*  PLT 45* 31*   BMET:  Recent Labs  01/20/14 0316 01/21/14 0420  NA 132* 135  K 4.1 3.7  CL 98 95*  CO2 25 29  GLUCOSE 104* 129*  BUN 21 18  CREATININE 1.15* 1.05  CALCIUM 8.8 8.7    PT/INR:  Lab Results  Component Value Date   INR 1.52* 01/17/2014   INR 1.07 01/13/2014   INR 0.99 06/06/2011   ABG:  INR: Will add last result for INR, ABG once components are confirmed Will add last 4 CBG results once components are confirmed  Assessment/Plan:  1. CV - A fib with RVR previously. Was  in [place] yesterday. Back in a fib with HR into 130's this am. Given 5 mg IV Lopressor. Will continue Amiodarone drip, give Lopressor 25 bid. Hopefully, converts to SR again so may avoid anticoagulation. Will restart low dose Lisinopril for better BP control. 2.  Pulmonary - Encourage incentive spirometer 3. Volume Overload - Lasix 40 daily 4.  Chronic anemia - H and H yesterday stable at 9.6 and 27.9 5. Supplement potassium 6. 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 Last platelets  31,000. 7. Check am cbc  ZIMMERMAN,DONIELLE MPA-C 01/21/2014,8:18 AM  Now off amio drip on AAI pacer SNF next week  patient examined and medical record reviewed,agree with above note. VAN TRIGT III,Kolbee Bogusz 01/21/2014

## 2014-01-21 NOTE — Progress Notes (Signed)
Called to notify MD, pt HR still 120-138 on amio drip.

## 2014-01-21 NOTE — Progress Notes (Signed)
Per PA turned on back up pacer to 80.  Turned off amiodarone drip.  BP 90/46

## 2014-01-21 NOTE — Clinical Social Work Note (Signed)
Clinical Social Worker met with pt at bedside to present/review SNF bed offers. Pt stated she would review bed offers and make a decision by Monday.   Pt lying in bed alert, oriented and smiling. Pt pleasant and appreciated social work intervention  CSW will continue to follow pt for continued support and to facilitate pt's discharge needs once medically stable.   Glendon Axe, MSW, LCSWA [phone] 01/21/2014 3:57 PM

## 2014-01-21 NOTE — Progress Notes (Signed)
  Amiodarone Drug - Drug Interaction Consult Note  Recommendations: MONITOR POTASSIUM. Amiodarone is metabolized by the cytochrome P450 system and therefore has the potential to cause many drug interactions. Amiodarone has an average plasma half-life of 50 days (range 20 to 100 days).   There is potential for drug interactions to occur several weeks or months after stopping treatment and the onset of drug interactions may be slow after initiating amiodarone.   [x]  Statins (ATORVASTATIN): Increased risk of myopathy. Simvastatin- restrict dose to 20mg  daily. Other statins: counsel patients to report any muscle pain or weakness immediately.  []  Anticoagulants: Amiodarone can increase anticoagulant effect. Consider warfarin dose reduction. Patients should be monitored closely and the dose of anticoagulant altered accordingly, remembering that amiodarone levels take several weeks to stabilize.  []  Antiepileptics: Amiodarone can increase plasma concentration of phenytoin, the dose should be reduced. Note that small changes in phenytoin dose can result in large changes in levels. Monitor patient and counsel on signs of toxicity.  []  Beta blockers: increased risk of bradycardia, AV block and myocardial depression. Sotalol - avoid concomitant use.  []   Calcium channel blockers (diltiazem and verapamil): increased risk of bradycardia, AV block and myocardial depression.  []   Cyclosporine: Amiodarone increases levels of cyclosporine. Reduced dose of cyclosporine is recommended.  []  Digoxin dose should be halved when amiodarone is started.  [x]  Diuretics (FUROSEMIDE): increased risk of cardiotoxicity if hypokalemia occurs.  []  Oral hypoglycemic agents (glyburide, glipizide, glimepiride): increased risk of hypoglycemia. Patient's glucose levels should be monitored closely when initiating amiodarone therapy.   []  Drugs that prolong the QT interval:  Torsades de pointes risk may be increased with  concurrent use - avoid if possible.  Monitor QTc, also keep magnesium/potassium WNL if concurrent therapy can't be avoided. Marland Kitchen Antibiotics: e.g. fluoroquinolones, erythromycin. . Antiarrhythmics: e.g. quinidine, procainamide, disopyramide, sotalol. . Antipsychotics: e.g. phenothiazines, haloperidol.  . Lithium, tricyclic antidepressants, and methadone. Thank You,  Wynona Neat, PharmD, BCPS   01/21/2014 12:04 AM

## 2014-01-21 NOTE — Progress Notes (Signed)
Pt would like to go to a rehab facility in [place].  Pt has caregiver in that area.

## 2014-01-21 NOTE — Progress Notes (Signed)
Patient had a brief period of A-Fib followed by prolonged period of sinus tach . RN notified by CCMD of patients HR changes.CCMD saved HR strips. Patient initially asymptomatic but began to feel her heart race. On-Call MD paged and orders given. Will continue to monitor patient closely. Burnell Blanks, RN

## 2014-01-21 NOTE — Progress Notes (Signed)
Pt HR now staying around 63-65 NSR.  Pacemaker was dialed back to 50 to check the pt's rate and then pacemaker was turning off.  Will continue to monitor.

## 2014-01-21 NOTE — Progress Notes (Signed)
Turned back on external pacer set to 70 bpm per PA and MD.

## 2014-01-21 NOTE — Progress Notes (Addendum)
CARDIAC REHAB PHASE I   PRE:  Rate/Rhythm: 72 paced  BP:  Supine:   Sitting: 112/64  Standing:    SaO2: 98% ra  MODE:  Ambulation: 150 ft   POST:  Rate/Rhythem: 72 paced   BP:  Supine:   Sitting: 120/60  Standing:    SaO2: 98% ra  1415-1450  Pt ambulated in hallway x1 assist.  Steady gait, 2 standing rest breaks.  Pt c/o fatigue and weakness. Pt returned to bed, call light in reach.  Pt granddaughter at bedside.  Pt interested in rehab facility upon discharge, prefers placement in [place], close proximity to her caregiver.  RN made aware to notify social work.  Pt oriented to outpatient cardiac rehab upon completion of skilled and home physical therapy. Pt would like referral sent to [facility].   Pt encouraged to continue IS use and sternal precautions reinforced.   Pt verbalized understanding of education.    [place], [place]

## 2014-01-22 LAB — CBC
HCT: 28.7 % — ABNORMAL LOW (ref 36.0–46.0)
HEMOGLOBIN: 9.5 g/dL — AB (ref 12.0–15.0)
MCH: 31.6 pg (ref 26.0–34.0)
MCHC: 33.1 g/dL (ref 30.0–36.0)
MCV: 95.3 fL (ref 78.0–100.0)
Platelets: 18 10*3/uL — CL (ref 150–400)
RBC: 3.01 MIL/uL — ABNORMAL LOW (ref 3.87–5.11)
RDW: 15.9 % — AB (ref 11.5–15.5)
WBC: 17.1 10*3/uL — AB (ref 4.0–10.5)

## 2014-01-22 MED ORDER — LISINOPRIL 10 MG PO TABS
10.0000 mg | ORAL_TABLET | Freq: Every day | ORAL | Status: DC
  Administered 2014-01-22: 10 mg via ORAL
  Filled 2014-01-22 (×2): qty 1

## 2014-01-22 MED ORDER — ASPIRIN EC 81 MG PO TBEC
81.0000 mg | DELAYED_RELEASE_TABLET | Freq: Every day | ORAL | Status: DC
  Administered 2014-01-23 – 2014-01-24 (×2): 81 mg via ORAL
  Filled 2014-01-22 (×2): qty 1

## 2014-01-22 NOTE — Progress Notes (Signed)
Physical Therapy Treatment Patient Details Name: Nicole Mercado MRN: [id number] DOB: 1940/11/11 Today's Date: 01/22/2014    History of Present Illness Nicole Mercado  is a 74 y.o. female, with a pmh of leukemia, thrombocytopenia, aortic stenosis, and carotid artery disease.  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 Min assist to maintain sternal precautions when exiting bed. Patient will continue to benefit from skilled physical therapy services to further improve independence with functional mobility.   Follow Up Recommendations  SNF;Supervision/Assistance - 24 hour     Equipment Recommendations  Rolling walker with 5" wheels;3in1 (PT)    Recommendations for Other Services OT consult     Precautions / Restrictions Precautions Precautions: Fall;Sternal Restrictions Weight Bearing Restrictions: Yes Other Position/Activity Restrictions: sternal    Mobility  Bed Mobility Overal bed mobility: Needs Assistance Bed Mobility: Sit to Sidelying;Sidelying to Sit   Sidelying to sit: Min assist     Sit to sidelying: Min guard General bed mobility comments: Min assist for truncal support into seated position with education for log roll technique to maintain sternal precautions. No assist needed for sit to sidelying.  Transfers Overall transfer level: Needs assistance Equipment used: None   Sit to Stand: Min guard         General transfer comment: Close guard for safety. Mild sway noted, reaches for furniture but declines using an assistive device for balance.  Ambulation/Gait Ambulation/Gait assistance: Min guard Ambulation Distance (Feet): 150 Feet Assistive device: Rolling walker (2 wheeled) Gait Pattern/deviations: Step-through pattern;Decreased stride length;Scissoring;Staggering left;Narrow base of support Gait velocity:  very slow Gait velocity interpretation: Below normal speed for age/gender General Gait Details: Slow and guarded, several occasions where pt staggers to her left and scissors her feet but was able to self correct each time. VC for forward gaze. No reports of feeling dizzy or lightheaded.   Stairs            Wheelchair Mobility    Modified Rankin (Stroke Patients Only)       Balance                                    Cognition Arousal/Alertness: Awake/alert Behavior During Therapy: WFL for tasks assessed/performed Overall Cognitive Status: Within Functional Limits for tasks assessed                      Exercises      General Comments        Pertinent Vitals/Pain Pain Assessment: No/denies pain  HR to 87    Home Living                      Prior Function            PT Goals (current goals can now be found in the care plan section) Acute Rehab PT Goals PT Goal Formulation: With patient Time For Goal Achievement: 02/02/14 Potential to Achieve Goals: Good Progress towards PT goals: Progressing toward goals    Frequency  Min 3X/week    PT Plan Current plan remains appropriate    Co-evaluation             End of Session Equipment Utilized During Treatment: Gait belt Activity Tolerance: Treatment limited secondary  to medical complications (Comment) (Ambulating only due to low platelets) Patient left: in bed;with call bell/phone within reach     Time: 1438-1455 PT Time Calculation (min) (ACUTE ONLY): 17 min  Charges:  $Gait Training: 8-22 mins                    G Codes:      Nicole Mercado 2014-02-02, 3:23 PM Nicole Mercado [place], [place]

## 2014-01-22 NOTE — Progress Notes (Addendum)
      [address]       [place] [address]             [phone]        5 Days Post-Op Procedure(s) (LRB): CORONARY ARTERY BYPASS GRAFTING (CABG)TIMES FOUR USING LEFT INTERNAL MAMMARY ARTERY AND RIGHT SAPHENOUS VEIN HARVESTED ENDOSCOPICALLY ([name]) AORTIC VALVE REPLACEMENT (AVR) ([name]) TRANSESOPHAGEAL ECHOCARDIOGRAM (TEE) ([name])  Subjective: Patient without complaints.  Objective: Vital signs in last 24 hours: Temp:  [98.2 F (36.8 C)-100.3 F (37.9 C)] 98.2 F (36.8 C) (01/17 0511) Pulse Rate:  [71-81] 81 (01/17 0511) Cardiac Rhythm:  [-] Normal sinus rhythm;Atrial paced (01/16 1920) Resp:  [17-18] 18 (01/17 0511) BP: (90-165)/(46-74) 150/60 mmHg (01/17 0511) SpO2:  [95 %-97 %] 97 % (01/16 2007) Weight:  [110 lb 1.6 oz (49.941 kg)] 110 lb 1.6 oz (49.941 kg) (01/17 0123)  Pre op weight 45 kg Current Weight  01/22/14 110 lb 1.6 oz (49.941 kg)      Intake/Output from previous day: 01/16 0701 - 01/17 0700 In: 480 [P.O.:480] Out: 1450 [Urine:1450]   Physical Exam:  Cardiovascular: RRR Pulmonary: Clear to auscultation bilaterally; no rales, wheezes, or rhonchi. Abdomen: Soft, non tender, bowel sounds present. Extremities: Mild bilateral lower extremity edema. Wounds: Clean and dry.  No erythema or signs of infection.  Lab Results: CBC:  Recent Labs  01/20/14 0316 01/22/14 0523  WBC 22.4* 17.1*  HGB 9.6* 9.5*  HCT 27.9* 28.7*  PLT 31* PENDING   BMET:   Recent Labs  01/20/14 0316 01/21/14 0420  NA 132* 135  K 4.1 3.7  CL 98 95*  CO2 25 29  GLUCOSE 104* 129*  BUN 21 18  CREATININE 1.15* 1.05  CALCIUM 8.8 8.7    PT/INR:  Lab Results  Component Value Date   INR 1.52* 01/17/2014   INR 1.07 01/13/2014   INR 0.99 06/06/2011   ABG:  INR: Will add last result for INR, ABG once components are confirmed Will add last 4 CBG results once components are confirmed  Assessment/Plan:  1. CV - PAF. Converted to SR yesterday.Given 5  mg IV Lopressor. On Amiodarone 200 bid, Lisinopril 5 daily, and Lopressor 12.5 bid. Still hypertensive so will increase Lisinopril. Turn off and disconnect EP 2.  Pulmonary - Encourage incentive spirometer 3. Volume Overload - Lasix 40 daily 4.  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 Platelets down to 18,000.   ZIMMERMAN,DONIELLE MPA-C 01/22/2014,8:25 AM   Patient's platelet count down to 18,000-she will need a unit of platelets infused prior to removing epicardial wires She is maintaining sinus rhythm-Will leave antiarrhythmic medications alone today  patient examined and medical record reviewed,agree with above note. VAN TRIGT III,Dimitra [place] 01/22/2014

## 2014-01-22 NOTE — Progress Notes (Signed)
CSW Armed forces technical officer) spoke with pt and pt would like to dc to [facility]. CSW notified facility of pt choice. Weekday CSW to follow up with facility on discharge date.  [place], LCSWA Weekend CSW [phone]

## 2014-01-22 NOTE — Progress Notes (Signed)
CRITICAL VALUE ALERT  Critical value received:  Platelets 18,000  Date of notification:  01/22/14  Time of notification:  0845  Critical value read back Yes  Nurse who received alert: Honor Loh RN  MD notified (1st page):  ZIMMERMAN,DONIELLE PA  is aware per this morning Notes

## 2014-01-23 ENCOUNTER — Encounter (HOSPITAL_COMMUNITY): Payer: Self-pay | Admitting: Surgery

## 2014-01-23 LAB — CBC
HEMATOCRIT: 28.8 % — AB (ref 36.0–46.0)
HEMOGLOBIN: 9.5 g/dL — AB (ref 12.0–15.0)
MCH: 31.3 pg (ref 26.0–34.0)
MCHC: 33 g/dL (ref 30.0–36.0)
MCV: 94.7 fL (ref 78.0–100.0)
PLATELETS: 44 10*3/uL — AB (ref 150–400)
RBC: 3.04 MIL/uL — ABNORMAL LOW (ref 3.87–5.11)
RDW: 16.5 % — ABNORMAL HIGH (ref 11.5–15.5)
WBC: 18.3 10*3/uL — ABNORMAL HIGH (ref 4.0–10.5)

## 2014-01-23 LAB — PREPARE PLATELET PHERESIS: Unit division: 0

## 2014-01-23 MED ORDER — SODIUM CHLORIDE 0.9 % IV SOLN
Freq: Once | INTRAVENOUS | Status: AC
  Administered 2014-01-23: 09:00:00 via INTRAVENOUS

## 2014-01-23 MED ORDER — LISINOPRIL 20 MG PO TABS
20.0000 mg | ORAL_TABLET | Freq: Every day | ORAL | Status: DC
  Administered 2014-01-23 – 2014-01-24 (×2): 20 mg via ORAL
  Filled 2014-01-23 (×2): qty 1

## 2014-01-23 NOTE — Discharge Instructions (Signed)
We ask the SNF to please do the following: 1. Please obtain vital signs at least one time daily 2.Please weigh the patient daily. If he or she continues to gain weight or develops lower extremity edema, contact the office at [phone]. 3. Ambulate patient at least three times daily and please use sternal precautions.  Activity: 1.May walk up steps                2.No lifting more than ten pounds for four weeks.                 3.No driving for four weeks.                4.Stop any activity that causes chest pain, shortness of breath, dizziness,  sweating or excessive weakness.                5.Avoid straining.                6.Continue with your breathing exercises daily.  Diet: Low fat, Low salt diet  Wound Care: May shower.  Clean wounds with mild soap and water daily. Contact the office at [phone] if any problems arise.  Coronary Artery Bypass Grafting, Care After Refer to this sheet in the next few weeks. These instructions provide you with information on caring for yourself after your procedure. Your health care provider may also give you more specific instructions. Your treatment has been planned according to current medical practices, but problems sometimes occur. Call your health care provider if you have any problems or questions after your procedure. WHAT TO EXPECT AFTER THE PROCEDURE Recovery from surgery will be different for everyone. Some people feel well after 3 or 4 weeks, while for others it takes longer. After your procedure, it is typical to have the following:  Nausea and a lack of appetite.   Constipation.  Weakness and fatigue.   Depression or irritability.   Pain or discomfort at your incision site. HOME CARE INSTRUCTIONS  Take medicines only as directed by your health care provider. Do not stop taking medicines or start any new medicines without first checking with your health care provider.  Take your pulse as directed by your health care  provider.  Perform deep breathing as directed by your health care provider. If you were given a device called an incentive spirometer, use it to practice deep breathing several times a day. Support your chest with a pillow or your arms when you take deep breaths or cough.  Keep incision areas clean, dry, and protected. Remove or change any bandages (dressings) only as directed by your health care provider. You may have skin adhesive strips over the incision areas. Do not take the strips off. They will fall off on their own.  Check incision areas daily for any swelling, redness, or drainage.  If incisions were made in your legs, do the following:  Avoid crossing your legs.   Avoid sitting for long periods of time. Change positions every 30 minutes.   Elevate your legs when you are sitting.  Wear compression stockings as directed by your health care provider. These stockings help keep blood clots from forming in your legs.  Take showers once your health care provider approves. Until then, only take sponge baths. Pat incisions dry. Do not rub incisions with a washcloth or towel. Do not take baths, swim, or use a hot tub until your health care provider approves.  Eat foods that are high in  fiber, such as raw fruits and vegetables, whole grains, beans, and nuts. Meats should be lean cut. Avoid canned, processed, and fried foods.  Drink enough fluid to keep your urine clear or pale yellow.  Weigh yourself every day. This helps identify if you are retaining fluid that may make your heart and lungs work harder.  Rest and limit activity as directed by your health care provider. You may be instructed to:  Stop any activity at once if you have chest pain, shortness of breath, irregular heartbeats, or dizziness. Get help right away if you have any of these symptoms.  Move around frequently for short periods or take short walks as directed by your health care provider. Increase your activities  gradually. You may need physical therapy or cardiac rehabilitation to help strengthen your muscles and build your endurance.  Avoid lifting, pushing, or pulling anything heavier than 10 lb (4.5 kg) for at least 6 weeks after surgery.  Do not drive until your health care provider approves.  Ask your health care provider when you may return to work.  Ask your health care provider when you may resume sexual activity.  Keep all follow-up visits as directed by your health care provider. This is important. SEEK MEDICAL CARE IF:  You have swelling, redness, increasing pain, or drainage at the site of an incision.  You have a fever.  You have swelling in your ankles or legs.  You have pain in your legs.   You gain 2 or more pounds (0.9 kg) a day.  You are nauseous or vomit.  You have diarrhea. SEEK IMMEDIATE MEDICAL CARE IF:  You have chest pain that goes to your jaw or arms.  You have shortness of breath.   You have a fast or irregular heartbeat.   You notice a "clicking" in your breastbone (sternum) when you move.   You have numbness or weakness in your arms or legs.  You feel dizzy or light-headed.  MAKE SURE YOU:  Understand these instructions.  Will watch your condition.  Will get help right away if you are not doing well or get worse. Document Released: 07/12/2004 Document Revised: 05/09/2013 Document Reviewed: 06/01/2012 [facility] Patient Information 2015 [place], [place]. This information is not intended to replace advice given to you by your health care provider. Make sure you discuss any questions you have with your health care provider.  Aortic Valve Replacement  Aortic valve replacement is a procedure to replace an aortic valve that cannot be repaired. An artificial (prosthetic) valve is used to do this. Three types of prosthetic valves are available:   Mechanical valves made entirely from man-made materials.   Donor valves made from human donors. These  are only used in special situations.   Biological valves made from animal tissues.  The type of prosthetic valve used will be determined based on various factors, including your age, your lifestyle, and other medical conditions you have.  LET [facility] CARE PROVIDER KNOW ABOUT:  Any allergies you have.  All medicines you are taking, including vitamins, herbs, eye drops, creams, and over-the-counter medicines.  Previous problems you or members of your family have had with the use of anesthetics.  Any blood disorders you have.  Previous surgeries you have had.  Medical conditions you have. RISKS AND COMPLICATIONS Generally, this is a safe procedure. However, as with any procedure, problems can occur. Possible problems include:   Blood clotting caused by the new valve. Replacement with a mechanical valve requires lifelong  treatment with medicine to prevent blood clots.   Infection in the new valve.   Valve failure.   Bleeding.   Reaction to anesthetics.  BEFORE THE PROCEDURE  Ask your health care provider about:  Changing or stopping your regular medicines. This is especially important if you are taking diabetes medicines or blood thinners.  Taking medicines such as aspirin and ibuprofen. These medicines can thin your blood. Do not take these medicines before your procedure if your health care provider asks you not to.  Do not eat or drink anything after midnight on the night before the procedure or as directed by your health care provider. PROCEDURE  The surgeon may use either an open technique or a minimally invasive technique for this surgery:  Traditional open surgery  You will be given a medicine that makes you fall asleep (general anesthetic).  You will then be placed on a heart-lung bypass machine. This machine provides oxygen to your blood while the heart is undergoing surgery.  During surgery, the surgeon will make a large cut (incision) in the  chest.  The heart will be cooled to slow or stop the heartbeat.  The damaged aortic valve will be removed and replaced with a prosthetic heart valve.  The incision will then be closed with staples or stitches. Minimally invasive surgery This is done through a smaller incision. This still requires general anesthetic and the heart-lung bypass machine. Your heart will be cooled to slow or stop the heartbeat, allowing the damaged valve to be removed and replaced with the new valve. The smaller incision will then be closed. If your condition allows for this procedure, there is often less blood loss, less pain, and faster recovery compared to traditional open surgery.  AFTER THE PROCEDURE You will be monitored closely in a recovery area. From there, you will likely go to an intensive care unit.  Document Released: 05/14/2004 Document Revised: 05/09/2013 Document Reviewed: 06/01/2012 [facility] Patient Information 2015 [place], [place]. This information is not intended to replace advice given to you by your health care provider. Make sure you discuss any questions you have with your health care provider.

## 2014-01-23 NOTE — Progress Notes (Signed)
Medicare Important Message given? YES  (If response is "NO", the following Medicare IM given date fields will be blank)  Date Medicare IM given: 01/23/14 Medicare IM given by:  Dahlia Client [employer]

## 2014-01-23 NOTE — Progress Notes (Addendum)
      [address]       [place] [address]             [phone]        6 Days Post-Op Procedure(s) (LRB): CORONARY ARTERY BYPASS GRAFTING (CABG)TIMES FOUR USING LEFT INTERNAL MAMMARY ARTERY AND RIGHT SAPHENOUS VEIN HARVESTED ENDOSCOPICALLY ([name]) AORTIC VALVE REPLACEMENT (AVR) ([name]) TRANSESOPHAGEAL ECHOCARDIOGRAM (TEE) ([name])  Subjective: Patient without complaints. Had a good night.  Objective: Vital signs in last 24 hours: Temp:  [97.9 F (36.6 C)-100 F (37.8 C)] 98.5 F (36.9 C) (01/18 0523) Pulse Rate:  [75-86] 86 (01/18 0523) Cardiac Rhythm:  [-] Normal sinus rhythm (01/17 1950) Resp:  [17-20] 18 (01/18 0523) BP: (116-169)/(46-93) 131/73 mmHg (01/18 0523) SpO2:  [95 %-98 %] 96 % (01/18 0523) Weight:  [107 lb 12.9 oz (48.9 kg)] 107 lb 12.9 oz (48.9 kg) (01/18 0523)  Pre op weight 45 kg Current Weight  01/23/14 107 lb 12.9 oz (48.9 kg)      Intake/Output from previous day: 01/17 0701 - 01/18 0700 In: 1000 [P.O.:720; Blood:280] Out: -    Physical Exam:  Cardiovascular: RRR Pulmonary: Clear to auscultation bilaterally; no rales, wheezes, or rhonchi. Abdomen: Soft, non tender, bowel sounds present. Extremities: Mild bilateral lower extremity edema. Wounds: Clean and dry.  No erythema or signs of infection.  Lab Results: CBC:  Recent Labs  01/22/14 0523 01/23/14 0347  WBC 17.1* 18.3*  HGB 9.5* 9.5*  HCT 28.7* 28.8*  PLT 18* 44*   BMET:   Recent Labs  01/21/14 0420  NA 135  K 3.7  CL 95*  CO2 29  GLUCOSE 129*  BUN 18  CREATININE 1.05  CALCIUM 8.7    PT/INR:  Lab Results  Component Value Date   INR 1.52* 01/17/2014   INR 1.07 01/13/2014   INR 0.99 06/06/2011   ABG:  INR: Will add last result for INR, ABG once components are confirmed Will add last 4 CBG results once components are confirmed  Assessment/Plan:  1. CV - PAF. Converted to SR again yesterday and remains in [place]. On Amiodarone 200 bid, Lisinopril 10  daily, and Lopressor 12.5 bid.  Was on Lisinopril 20 daily pre op and will increase for better BP control. 2.  Pulmonary - Encourage incentive spirometer 3. Volume Overload - Lasix 40 daily 4.  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 Platelets up to 44,000. 7. Remove EPW after another unit of platelets given 8. To [place] in am  ZIMMERMAN,DONIELLE MPA-C 01/23/2014,7:32 AM   Patient seen and examined, agree with above Looks great

## 2014-01-23 NOTE — Progress Notes (Signed)
EPW discontinued per MD orders and per protocol. Wires intact and patient tolerated procedure well.  Patient verbalized understanding of bedrest x 1 hour. Call bell within reach and patient instructed to call RN with any new concerns and/or issues.  

## 2014-01-24 LAB — CBC
HCT: 28.5 % — ABNORMAL LOW (ref 36.0–46.0)
HEMOGLOBIN: 9.1 g/dL — AB (ref 12.0–15.0)
MCH: 31.3 pg (ref 26.0–34.0)
MCHC: 31.9 g/dL (ref 30.0–36.0)
MCV: 97.9 fL (ref 78.0–100.0)
PLATELETS: 87 10*3/uL — AB (ref 150–400)
RBC: 2.91 MIL/uL — ABNORMAL LOW (ref 3.87–5.11)
RDW: 16.7 % — ABNORMAL HIGH (ref 11.5–15.5)
WBC: 14.4 10*3/uL — AB (ref 4.0–10.5)

## 2014-01-24 LAB — PREPARE PLATELET PHERESIS: Unit division: 0

## 2014-01-24 MED ORDER — AMLODIPINE BESYLATE 5 MG PO TABS
5.0000 mg | ORAL_TABLET | Freq: Every day | ORAL | Status: DC
  Administered 2014-01-24: 5 mg via ORAL
  Filled 2014-01-24: qty 1

## 2014-01-24 MED ORDER — ATORVASTATIN CALCIUM 10 MG PO TABS: 10.0000 mg | ORAL_TABLET | Freq: Every day | ORAL | Status: DC

## 2014-01-24 MED ORDER — FUROSEMIDE 40 MG PO TABS: 40.0000 mg | ORAL_TABLET | Freq: Every day | ORAL | Status: DC

## 2014-01-24 MED ORDER — METOPROLOL SUCCINATE ER 25 MG PO TB24: 25.0000 mg | ORAL_TABLET | Freq: Every day | ORAL | Status: DC

## 2014-01-24 MED ORDER — POTASSIUM CHLORIDE CRYS ER 20 MEQ PO TBCR
20.0000 meq | EXTENDED_RELEASE_TABLET | Freq: Every day | ORAL | Status: DC
  Administered 2014-01-24: 20 meq via ORAL
  Filled 2014-01-24: qty 1

## 2014-01-24 MED ORDER — TRAMADOL HCL 50 MG PO TABS: 50.0000 mg | ORAL_TABLET | ORAL | Status: DC | PRN

## 2014-01-24 MED ORDER — FUROSEMIDE 40 MG PO TABS
40.0000 mg | ORAL_TABLET | Freq: Every day | ORAL | Status: DC
  Administered 2014-01-24: 40 mg via ORAL
  Filled 2014-01-24: qty 1

## 2014-01-24 MED ORDER — AMLODIPINE BESYLATE 5 MG PO TABS: 5.0000 mg | ORAL_TABLET | Freq: Every day | ORAL | Status: DC

## 2014-01-24 MED ORDER — LACTULOSE 10 GM/15ML PO SOLN
20.0000 g | Freq: Once | ORAL | Status: AC
  Administered 2014-01-24: 20 g via ORAL
  Filled 2014-01-24: qty 30

## 2014-01-24 MED ORDER — POTASSIUM CHLORIDE CRYS ER 20 MEQ PO TBCR: 20.0000 meq | EXTENDED_RELEASE_TABLET | Freq: Every day | ORAL | Status: DC

## 2014-01-24 MED ORDER — AMIODARONE HCL 200 MG PO TABS: 200.0000 mg | ORAL_TABLET | Freq: Two times a day (BID) | ORAL | Status: DC

## 2014-01-24 NOTE — Progress Notes (Signed)
      [address]       [place] [address]             [phone]        7 Days Post-Op Procedure(s) (LRB): CORONARY ARTERY BYPASS GRAFTING (CABG)TIMES FOUR USING LEFT INTERNAL MAMMARY ARTERY AND RIGHT SAPHENOUS VEIN HARVESTED ENDOSCOPICALLY ([name]) AORTIC VALVE REPLACEMENT (AVR) ([name]) TRANSESOPHAGEAL ECHOCARDIOGRAM (TEE) ([name])  Subjective: Patient without bowel movement.  Objective: Vital signs in last 24 hours: Temp:  [98.2 F (36.8 C)-98.8 F (37.1 C)] 98.4 F (36.9 C) (01/19 0401) Pulse Rate:  [73-81] 75 (01/19 0401) Cardiac Rhythm:  [-] Normal sinus rhythm (01/18 1920) Resp:  [18] 18 (01/19 0401) BP: (130-195)/(50-70) 167/59 mmHg (01/19 0401) SpO2:  [96 %-98 %] 96 % (01/19 0401) Weight:  [107 lb 2.3 oz (48.6 kg)] 107 lb 2.3 oz (48.6 kg) (01/19 0349)  Pre op weight 45 kg Current Weight  01/24/14 107 lb 2.3 oz (48.6 kg)      Intake/Output from previous day: 01/18 0701 - 01/19 0700 In: 761 [P.O.:480; Blood:281] Out: -    Physical Exam:  Cardiovascular: RRR Pulmonary: Clear to auscultation bilaterally; no rales, wheezes, or rhonchi. Abdomen: Soft, non tender, bowel sounds present. Extremities: Mild bilateral lower extremity edema. Wounds: Clean and dry.  No erythema or signs of infection.  Lab Results: CBC:  Recent Labs  01/23/14 0347 01/24/14 0402  WBC 18.3* 14.4*  HGB 9.5* 9.1*  HCT 28.8* 28.5*  PLT 44* 87*   BMET:  No results for input(s): NA, K, CL, CO2, GLUCOSE, BUN, CREATININE, CALCIUM in the last 72 hours.  PT/INR:  Lab Results  Component Value Date   INR 1.52* 01/17/2014   INR 1.07 01/13/2014   INR 0.99 06/06/2011   ABG:  INR: Will add last result for INR, ABG once components are confirmed Will add last 4 CBG results once components are confirmed  Assessment/Plan:  1. CV - Previous PAF. Maintaining SR.On Amiodarone 200 bid, Lisinopril 20 daily, and Lopressor 12.5 bid. Will change Lopressor to Toprol XL. Still  hypertensive. Will start Norvasc. 2.  Pulmonary - Encourage incentive spirometer 3. Volume Overload - Lasix 40 daily 4.  Chronic anemia - H and H  stable at 9.1 and 28.5 6. 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 Has been give 2 packs of platelets.Platelets up to 87,000. 7. Lactulose for constipation 8. To [place] MPA-C 01/24/2014,7:24 AM   Patient seen and examined, agree with above Looks great

## 2014-01-24 NOTE — Progress Notes (Signed)
Physical Therapy Treatment Patient Details Name: Nicole Mercado MRN: [id number] DOB: 03-Feb-1940 Today's Date: 02-05-2014    History of Present Illness Nicole Mercado  is a 74 y.o. female, with a pmh of leukemia, thrombocytopenia, aortic stenosis, and carotid artery disease.  Admitted with syncope s/p CABG and AVR 1/12      PT Comments    Pt moving very well with reinforcement needed for precautions with all mobility. Pt encouraged to continue HEP and gait. Pt considering attempting to find 24hr assist for return home.   Follow Up Recommendations  SNF;Supervision/Assistance - 24 hour     Equipment Recommendations       Recommendations for Other Services       Precautions / Restrictions Precautions Precautions: Fall;Sternal Precaution Comments: pt continues to have difficulty recalling precautions    Mobility  Bed Mobility   Bed Mobility: Rolling;Sidelying to Sit   Sidelying to sit: Supervision     Sit to sidelying: Supervision General bed mobility comments: cues for sequence no physical assist needed  Transfers     Transfers: Sit to/from Stand Sit to Stand: Supervision         General transfer comment: cues for hand placement to maintain precautions  Ambulation/Gait Ambulation/Gait assistance: Min guard Ambulation Distance (Feet): 350 Feet Assistive device: None Gait Pattern/deviations: Step-through pattern;Decreased stride length     General Gait Details: 2 slight LOB with pt able to recover on her own. Guarded gait with decreased arm swing and stride   Stairs            Wheelchair Mobility    Modified Rankin (Stroke Patients Only)       Balance Overall balance assessment: Needs assistance   Sitting balance-Leahy Scale: Good       Standing balance-Leahy Scale: Good                      Cognition Arousal/Alertness: Awake/alert Behavior During Therapy: WFL for tasks assessed/performed Overall Cognitive Status: Within Functional  Limits for tasks assessed       Memory: Decreased recall of precautions              Exercises General Exercises - Lower Extremity Long Arc Quad: AROM;Seated;Both;20 reps Hip Flexion/Marching: AROM;Seated;Both;20 reps Toe Raises: AROM;Seated;Both;20 reps Heel Raises: AROM;Seated;Both;20 reps    General Comments        Pertinent Vitals/Pain Pain Assessment: No/denies pain  HR 88 with gait    Home Living                      Prior Function            PT Goals (current goals can now be found in the care plan section) Progress towards PT goals: Progressing toward goals    Frequency       PT Plan Current plan remains appropriate    Co-evaluation             End of Session   Activity Tolerance: Patient tolerated treatment well Patient left: in chair;with call bell/phone within Mercado;with family/visitor present     Time: 0742-0759 PT Time Calculation (min) (ACUTE ONLY): 17 min  Charges:  $Gait Training: 8-22 mins                    G Codes:      Nicole Mercado February 05, 2014, 8:11 AM Nicole Mercado, Nicole Mercado

## 2014-01-24 NOTE — Clinical Social Work Note (Signed)
Patient is able to DC today to [facility]- facility has space available.  Patient to be picked up by a friend at around 11am to be taken to [facility].  Patient will discharge to [facility] Anticipated discharge date:01/14/13 Family notified: patient to notify Transportation by friends private vehicle- approx 11am  CSW signing off.  Domenica Reamer, [place] Social Worker [phone]

## 2014-01-24 NOTE — Progress Notes (Signed)
Attempted to call report to receiving RN at [facility]. Per Network engineer, patient will go to room 407 but the nurse didn't answer when call was transferred and call was ended. Discharge packet given to patient and family to give to facility upon arrival. Telemetry discontinued and IV removed.

## 2014-01-25 ENCOUNTER — Encounter: Payer: Self-pay | Admitting: Adult Health

## 2014-01-25 ENCOUNTER — Non-Acute Institutional Stay (SKILLED_NURSING_FACILITY): Payer: Medicare Other | Admitting: Adult Health

## 2014-01-25 DIAGNOSIS — I350 Nonrheumatic aortic (valve) stenosis: Secondary | ICD-10-CM

## 2014-01-25 DIAGNOSIS — E8770 Fluid overload, unspecified: Secondary | ICD-10-CM

## 2014-01-25 DIAGNOSIS — R5381 Other malaise: Secondary | ICD-10-CM

## 2014-01-25 DIAGNOSIS — I2510 Atherosclerotic heart disease of native coronary artery without angina pectoris: Secondary | ICD-10-CM

## 2014-01-25 DIAGNOSIS — E785 Hyperlipidemia, unspecified: Secondary | ICD-10-CM

## 2014-01-25 DIAGNOSIS — I10 Essential (primary) hypertension: Secondary | ICD-10-CM

## 2014-01-25 DIAGNOSIS — C9110 Chronic lymphocytic leukemia of B-cell type not having achieved remission: Secondary | ICD-10-CM

## 2014-01-25 DIAGNOSIS — I4891 Unspecified atrial fibrillation: Secondary | ICD-10-CM

## 2014-01-25 DIAGNOSIS — Z952 Presence of prosthetic heart valve: Secondary | ICD-10-CM

## 2014-01-25 DIAGNOSIS — Z954 Presence of other heart-valve replacement: Secondary | ICD-10-CM

## 2014-01-25 NOTE — Progress Notes (Addendum)
Patient ID: Nicole Mercado, female   DOB: 10/25/1940, 74 y.o.   MRN: [id number]   01/25/2014  Facility:  [facility]ursing Home Location:  [place] Room Number: 407-P LEVEL OF CARE:  SNF (31)   Chief Complaint  Patient presents with  . Hospitalization Follow-up    Physical deconditioning, CAD S/P CABG, severe aortic stenosis S/P AVR, atrial fibrillation, hypertension, hyperlipidemia, chronic thrombocytopenia, chronic lymphocytic leukemia and volume overload    HISTORY OF PRESENT ILLNESS:  This is a 74 year old female who has been admitted to [facility] on 01/24/14 from [facility]. She has past medical history of chronic lymphocytic leukemia, chronic thrombocytopenia, hypertension and severe aortic stenosis. In November 2013 echocardiogram showed a trileaflet aortic valve with severe calcification and severe aortic stenosis, EF was greater than 55%. Repeat echocardiogram in May 2014 showed EF 64-68%, grade 1 diastolic dysfunction, aortic valve area 0.49, mild MR, PA peak pressure 41 mmHg. She refused surgery at that time and was able to do daily activities without symptoms. On 01/11/14 she had sudden onset of dizziness and passed out in the shower. She woke up while on the floor and called friend who then called EMS. In the hospital, a repeat echo on 01/11/14 shows critical AS with a mean gradient of 61 and a peak of 104. The AVA is 0.45 cm. The aorta is normal size. LVEF is 60-65%. Cardiac catheterization showed 70% distal LM extending into the ostium of the LAD. D1 has 80% proximal stenosis and D2 has 70% proximal stenosis. The OM1 has 70% ostial stenosis.  On 01/16/14 she had CABG 4 and AVR. Postop day 2 was complicated with A. fib with RVR - was put on amiodarone and converted and remained in sinus rhythm. Lopressor was started.  Patient is seen today in her room, alert and oriented. No SOB nor any complaints of pain. She has been admitted for a short-term  rehabilitation.   PAST MEDICAL HISTORY:  Past Medical History  Diagnosis Date  . Thrombocytopenia 11/19/2010  . Hypertension   . 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 Arthritis   . Heart murmur   . Pancreatitis     h/o  . Carotid artery occlusion     CURRENT MEDICATIONS: Reviewed per MAR/see medication list  No Known Allergies   REVIEW OF SYSTEMS:  GENERAL: no change in appetite, no fatigue, no weight changes, no fever, chills or weakness RESPIRATORY: no cough, SOB, DOE, wheezing, hemoptysis CARDIAC: no chest pain, or palpitations GI: no abdominal pain, diarrhea, constipation, heart burn, nausea or vomiting  PHYSICAL EXAMINATION  GENERAL: no acute distress, normal body habitus SKIN:  Sternal surgical site is dry, no erythema EYES: conjunctivae normal, sclerae normal, normal eye lids NECK: supple, trachea midline, no neck masses, no thyroid tenderness, no thyromegaly LYMPHATICS: no LAN in the neck, no supraclavicular LAN RESPIRATORY: breathing is even & unlabored, BS CTAB CARDIAC: RRR, no murmur,no extra heart sounds, no edema GI: abdomen soft, normal BS, no masses, no tenderness, no hepatomegaly, no splenomegaly EXTREMITIES: Able to hold 4 extremities PSYCHIATRIC: the patient is alert & oriented to person, affect & behavior appropriate  LABS/RADIOLOGY: Labs reviewed: Basic Metabolic Panel:  Recent Labs  01/17/14 2000 01/18/14 0500  01/18/14 1734 01/19/14 0400 01/20/14 0316 01/21/14 0420  NA 136 133*  < >  --  135 132* 135  K 5.6*  5.6* 4.7  < >  --  4.5 4.1 3.7  CL 107 105  < >  --  103 98 95*  CO2  --  24  --   --  25 25 29   GLUCOSE 177* 113*  < >  --  101* 104* 129*  BUN 18 16  < >  --  20 21 18   CREATININE 1.03  1.00 1.05  < > 1.30* 1.26* 1.15* 1.05  CALCIUM  --  8.1*  --   --  8.8 8.8 8.7  MG 3.6* 2.9*  --  2.4  --   --   --   < > = values in this interval not displayed.  Liver Function Tests:  Recent Labs  01/12/14 0619  AST 23    ALT 13  ALKPHOS 59  BILITOT 0.6  PROT 5.1*  ALBUMIN 3.1*    CBC:  Recent Labs  01/11/14 0826  01/22/14 0523 01/23/14 0347 01/24/14 0402  WBC 14.8*  < > 17.1* 18.3* 14.4*  NEUTROABS 2.5  --   --   --   --   HGB 11.0*  < > 9.5* 9.5* 9.1*  HCT 32.7*  < > 28.7* 28.8* 28.5*  MCV 94.5  < > 95.3 94.7 97.9  PLT 44*  < > 18* 44* 87*  < > = values in this interval not displayed.   Recent Labs  01/11/14 1857 01/12/14 0619  TROPONINI <0.03 0.03   CBG:  Recent Labs  01/20/14 0358 01/20/14 0804 01/20/14 1216  GLUCAP 96 97 97    Ct Head Wo Contrast  01/11/2014   CLINICAL DATA:  Fall in shower this morning with dizziness  EXAM: CT HEAD WITHOUT CONTRAST  CT CERVICAL SPINE WITHOUT CONTRAST  TECHNIQUE: Multidetector CT imaging of the head and cervical spine was performed following the standard protocol without intravenous contrast. Multiplanar CT image reconstructions of the cervical spine were also generated.  COMPARISON:  None  FINDINGS: CT HEAD FINDINGS  The bony calvarium is intact. A scalp hematoma and soft tissue laceration are noted in the right posterior parietal region. Mild atrophic changes are noted. Mild chronic white matter ischemic change is seen. No findings to suggest acute hemorrhage, acute infarction or space-occupying mass lesion are noted.  CT CERVICAL SPINE FINDINGS  Seven cervical segments are well visualized. Vertebral body height is well maintained. Multilevel osteophytic changes are noted as well as facet hypertrophic changes. The hypertrophic facet changes are predominately noted on the left. Calcifications of the carotid and vertebral arteries are noted. No acute fracture or acute facet abnormality is seen.  IMPRESSION: CT of the head: Soft tissue injury in the right parietal region without acute intracranial abnormality.  CT of the cervical spine: Multilevel degenerative changes without acute abnormality. The facet hypertrophic changes are predominantly on the left.    Electronically Signed   By: Inez Catalina M.D.   On: 01/11/2014 10:08   Ct Cervical Spine Wo Contrast  01/11/2014   CLINICAL DATA:  Fall in shower this morning with dizziness  EXAM: CT HEAD WITHOUT CONTRAST  CT CERVICAL SPINE WITHOUT CONTRAST  TECHNIQUE: Multidetector CT imaging of the head and cervical spine was performed following the standard protocol without intravenous contrast. Multiplanar CT image reconstructions of the cervical spine were also generated.  COMPARISON:  None  FINDINGS: CT HEAD FINDINGS  The bony calvarium is intact. A scalp hematoma and soft tissue laceration are noted in the right posterior parietal region. Mild atrophic changes are noted. Mild chronic white matter ischemic change is seen. No findings to suggest acute hemorrhage, acute infarction or space-occupying mass lesion  are noted.  CT CERVICAL SPINE FINDINGS  Seven cervical segments are well visualized. Vertebral body height is well maintained. Multilevel osteophytic changes are noted as well as facet hypertrophic changes. The hypertrophic facet changes are predominately noted on the left. Calcifications of the carotid and vertebral arteries are noted. No acute fracture or acute facet abnormality is seen.  IMPRESSION: CT of the head: Soft tissue injury in the right parietal region without acute intracranial abnormality.  CT of the cervical spine: Multilevel degenerative changes without acute abnormality. The facet hypertrophic changes are predominantly on the left.   Electronically Signed   By: Inez Catalina M.D.   On: 01/11/2014 10:08   Dg Chest Port 1 View  01/19/2014   CLINICAL DATA:  Aortic valve replacement.  EXAM: PORTABLE CHEST - 1 VIEW  COMPARISON:  01/18/2014.  FINDINGS: Interim removal of Swan-Ganz catheter, mediastinal drainage catheter, left chest tube. Right IJ she in good anatomic position. Prior CABG and aortic valve replacement. Stable cardiomegaly. Bibasilar pulmonary infiltrates and or edema. Small left pleural  effusion. No pneumothorax.  IMPRESSION: 1. Interim removal of Swan-Ganz catheter, mediastinal drainage catheter, left chest tube. No pneumothorax. 2. Stable cardiomegaly.  Prior CABG and aortic valve replacement. 3. Bibasilar pulmonary infiltrates/edema and small pleural effusion again noted.   Electronically Signed   By: Marcello Moores  Register   On: 01/19/2014 08:05   Dg Chest Port 1 View  01/18/2014   CLINICAL DATA:  Status post CABG and aortic valve replacement.  EXAM: PORTABLE CHEST - 1 VIEW  COMPARISON:  Portable chest x-ray of January 17, 2014  FINDINGS: The right lung is well-expanded and clear. On the left there is retrocardiac subsegmental atelectasis or early infiltrate. The tiny left apical pneumothorax is not evident today. The cardiac silhouette is top-normal in size. The pulmonary vascularity is not engorged. The prosthetic aortic valve is visible. There are 7 intact sternal wires.  The trachea and esophagus have been extubated. The Swan-Ganz catheter tip lies in the region of the proximal right main pulmonary artery. A left-sided chest tube is unchanged with the tip projecting over the posterior medial aspect of the sixth rib. The mediastinal drain is unchanged with the tip projecting over the T5 region. A second lower chest tube -mediastinal drain on the left is stable with the tip lying in the medial costophrenic gutter.  IMPRESSION: Interval extubation of the trachea and esophagus. The support tubes and lines are in appropriate position. No significant pneumothorax is demonstrated today. There is left lower lobe atelectasis and/or pneumonia that is slightly more conspicuous today.   Electronically Signed   By: David  Martinique   On: 01/18/2014 08:15   Dg Chest Port 1 View  01/17/2014   CLINICAL DATA:  Post aortic valve replacement.  EXAM: PORTABLE CHEST - 1 VIEW  COMPARISON:  01/11/2014  FINDINGS: Changes of aortic valve replacement. Endotracheal tube is approximately 4 cm above the carina. Left chest  tube is in place. Tiny left apical pneumothorax suspected. Swan-Ganz catheter tip is in the main pulmonary artery. NG tube is in the stomach.  Heart is normal size.  Minimal left base atelectasis.  No effusions.  IMPRESSION: Postoperative changes. Tiny left apical pneumothorax. Support devices in expected position.   Electronically Signed   By: Rolm Baptise M.D.   On: 01/17/2014 14:45   Dg Chest Port 1 View  01/11/2014   CLINICAL DATA:  Syncopal episode.  Fall.  EXAM: PORTABLE CHEST - 1 VIEW  COMPARISON:  03/21/2011.  FINDINGS:  Cardiopericardial silhouette within normal limits. Stable prominent costochondral calcification at the RIGHT first rib end. Monitoring leads project over the chest. No airspace disease. No pleural effusion. No displaced rib fracture or pneumothorax in this patient with a fall. Aortic arch atherosclerosis.  IMPRESSION: No active cardiopulmonary disease and no interval change.   Electronically Signed   By: Dereck Ligas M.D.   On: 01/11/2014 09:05    ASSESSMENT/PLAN:  Physical deconditioning - for rehabilitation CAD S/P CABG 4 - stable; continue aspirin 81 mg by mouth daily; follow-up with cardiothoracic surgery - Dr. Gilford Raid Severe aortic stenosis S/P AVR - stable; continue Ultram 50 mg 1-2 tabs by mouth every 4 hours when necessary and Tylenol 1 g by mouth every 6 hours when necessary for pain Atrial fibrillation - converted to sinus rhythm; continue amiodarone 200 mg by mouth twice a day 3 days then Amiodaron 200 mg daily  and Toprol 37.5 mg by mouth daily Hypertension - well controlled; continue Norvasc 5 mg by mouth daily, Lisinopril 20 mg by mouth daily and Toprol 37.5 mg by mouth daily Hyperlipidemia - continue Lipitor 10 mg by mouth daily Chronic thrombocytopenia - S/P transfusion of 2 units platelets in the hospital; monitor her platelets Chronic lymphocytic leukemia - follow-up with oncology Volume overload - continue Lasix 40 mg by mouth daily 5 days then  stop as ordered   Goals of care:  Short-term rehabilitation   Labs/test ordered:  CBC, CMP   Spent 50 minutes in patient care.   [facility], NP [employer] [phone]

## 2014-01-26 ENCOUNTER — Non-Acute Institutional Stay (SKILLED_NURSING_FACILITY): Payer: Medicare Other | Admitting: Internal Medicine

## 2014-01-26 DIAGNOSIS — R5381 Other malaise: Secondary | ICD-10-CM

## 2014-01-26 DIAGNOSIS — E785 Hyperlipidemia, unspecified: Secondary | ICD-10-CM | POA: Diagnosis not present

## 2014-01-26 DIAGNOSIS — I2510 Atherosclerotic heart disease of native coronary artery without angina pectoris: Secondary | ICD-10-CM

## 2014-01-26 DIAGNOSIS — I4891 Unspecified atrial fibrillation: Secondary | ICD-10-CM

## 2014-01-26 DIAGNOSIS — D696 Thrombocytopenia, unspecified: Secondary | ICD-10-CM

## 2014-01-26 DIAGNOSIS — C9110 Chronic lymphocytic leukemia of B-cell type not having achieved remission: Secondary | ICD-10-CM | POA: Diagnosis not present

## 2014-01-26 DIAGNOSIS — I350 Nonrheumatic aortic (valve) stenosis: Secondary | ICD-10-CM

## 2014-01-30 ENCOUNTER — Non-Acute Institutional Stay (SKILLED_NURSING_FACILITY): Payer: Medicare Other | Admitting: Adult Health

## 2014-01-30 ENCOUNTER — Encounter: Payer: Self-pay | Admitting: Adult Health

## 2014-01-30 DIAGNOSIS — C9110 Chronic lymphocytic leukemia of B-cell type not having achieved remission: Secondary | ICD-10-CM

## 2014-01-30 DIAGNOSIS — E785 Hyperlipidemia, unspecified: Secondary | ICD-10-CM

## 2014-01-30 DIAGNOSIS — I4891 Unspecified atrial fibrillation: Secondary | ICD-10-CM

## 2014-01-30 DIAGNOSIS — Z952 Presence of prosthetic heart valve: Secondary | ICD-10-CM

## 2014-01-30 DIAGNOSIS — R5381 Other malaise: Secondary | ICD-10-CM

## 2014-01-30 DIAGNOSIS — I10 Essential (primary) hypertension: Secondary | ICD-10-CM

## 2014-01-30 DIAGNOSIS — I350 Nonrheumatic aortic (valve) stenosis: Secondary | ICD-10-CM

## 2014-01-30 DIAGNOSIS — Z954 Presence of other heart-valve replacement: Secondary | ICD-10-CM

## 2014-01-30 DIAGNOSIS — I2510 Atherosclerotic heart disease of native coronary artery without angina pectoris: Secondary | ICD-10-CM

## 2014-01-30 NOTE — Progress Notes (Signed)
Patient ID: Nicole Mercado, female   DOB: 1940-09-29, 74 y.o.   MRN: [id number]   01/30/2014  Facility:  [facility]ursing Home Location:  [place] Room Number: 407-P LEVEL OF CARE:  SNF (31)   Chief Complaint  Patient presents with  . Discharge Note    Physical deconditioning, CAD S/P CABG, severe aortic stenosis S/P AVR, atrial fibrillation, hypertension, hyperlipidemia, chronic thrombocytopenia and chronic lymphocytic leukemia     HISTORY OF PRESENT ILLNESS:  This is a 74 year old female who is for discharge home with home health PT.  She has been admitted to [facility] on 01/24/14 from [facility]. She has past medical history of chronic lymphocytic leukemia, chronic thrombocytopenia, hypertension and severe aortic stenosis. In November 2013 echocardiogram showed a trileaflet aortic valve with severe calcification and severe aortic stenosis, EF was greater than 55%. Repeat echocardiogram in May 2014 showed EF 66-44%, grade 1 diastolic dysfunction, aortic valve area 0.49, mild MR, PA peak pressure 41 mmHg. She refused surgery at that time and was able to do daily activities without symptoms. On 01/11/14 she had sudden onset of dizziness and passed out in the shower. She woke up while on the floor and called friend who then called EMS. In the hospital, a repeat echo on 01/11/14 shows critical AS with a mean gradient of 61 and a peak of 104. The AVA is 0.45 cm. The aorta is normal size. LVEF is 60-65%. Cardiac catheterization showed 70% distal LM extending into the ostium of the LAD. D1 has 80% proximal stenosis and D2 has 70% proximal stenosis. The OM1 has 70% ostial stenosis.  On 01/16/14 she had CABG 4 and AVR. Postop day 2 was complicated with A. fib with RVR - was put on amiodarone and converted and remained in sinus rhythm. Lopressor was started.  Patient was admitted to this facility for short-term rehabilitation after the patient's recent hospitalization.   Patient has completed SNF rehabilitation and therapy has cleared the patient for discharge.  PAST MEDICAL HISTORY:  Past Medical History  Diagnosis Date  . Thrombocytopenia 11/19/2010  . Hypertension   . 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 Arthritis   . Heart murmur   . Pancreatitis     h/o  . Carotid artery occlusion     CURRENT MEDICATIONS: Reviewed per MAR/see medication list  No Known Allergies   REVIEW OF SYSTEMS:  GENERAL: no change in appetite, no fatigue, no weight changes, no fever, chills or weakness RESPIRATORY: no cough, SOB, DOE, wheezing, hemoptysis CARDIAC: no chest pain, or palpitations GI: no abdominal pain, diarrhea, constipation, heart burn, nausea or vomiting  PHYSICAL EXAMINATION  GENERAL: no acute distress, normal body habitus SKIN:  Sternal surgical site is dry, healed EYES: conjunctivae normal, sclerae normal, normal eye lids NECK: supple, trachea midline, no neck masses, no thyroid tenderness, no thyromegaly LYMPHATICS: no LAN in the neck, no supraclavicular LAN RESPIRATORY: breathing is even & unlabored, BS CTAB CARDIAC: RRR, no murmur,no extra heart sounds, no edema GI: abdomen soft, normal BS, no masses, no tenderness, no hepatomegaly, no splenomegaly EXTREMITIES: Able to move 4 extremities PSYCHIATRIC: the patient is alert & oriented to person, affect & behavior appropriate  LABS/RADIOLOGY: 01/26/14  WBC 12.6 hemoglobin 9.6 hematocrit 29.9 MCV 98.4 sodium 134 potassium 4.1 glucose 125 BUN 20 creatinine 1.1 calcium 9.2 total protein 5.3 albumin 3.4  ALP 65 AST 22 ALT 44 GFR 52.84 Labs reviewed: Basic Metabolic Panel:  Recent  Labs  01/17/14 2000 01/18/14 0500  01/18/14 1734 01/19/14 0400 01/20/14 0316 01/21/14 0420  NA 136 133*  < >  --  135 132* 135  K 5.6*  5.6* 4.7  < >  --  4.5 4.1 3.7  CL 107 105  < >  --  103 98 95*  CO2  --  24  --   --  25 25 29   GLUCOSE 177* 113*  < >  --  101* 104* 129*  BUN 18 16  < >  --  20  21 18   CREATININE 1.03  1.00 1.05  < > 1.30* 1.26* 1.15* 1.05  CALCIUM  --  8.1*  --   --  8.8 8.8 8.7  MG 3.6* 2.9*  --  2.4  --   --   --   < > = values in this interval not displayed.  Liver Function Tests:  Recent Labs  01/12/14 0619  AST 23  ALT 13  ALKPHOS 59  BILITOT 0.6  PROT 5.1*  ALBUMIN 3.1*    CBC:  Recent Labs  01/11/14 0826  01/22/14 0523 01/23/14 0347 01/24/14 0402  WBC 14.8*  < > 17.1* 18.3* 14.4*  NEUTROABS 2.5  --   --   --   --   HGB 11.0*  < > 9.5* 9.5* 9.1*  HCT 32.7*  < > 28.7* 28.8* 28.5*  MCV 94.5  < > 95.3 94.7 97.9  PLT 44*  < > 18* 44* 87*  < > = values in this interval not displayed.   Recent Labs  01/11/14 1857 01/12/14 0619  TROPONINI <0.03 0.03   CBG:  Recent Labs  01/20/14 0358 01/20/14 0804 01/20/14 1216  GLUCAP 96 97 97    Ct Head Wo Contrast  01/11/2014   CLINICAL DATA:  Fall in shower this morning with dizziness  EXAM: CT HEAD WITHOUT CONTRAST  CT CERVICAL SPINE WITHOUT CONTRAST  TECHNIQUE: Multidetector CT imaging of the head and cervical spine was performed following the standard protocol without intravenous contrast. Multiplanar CT image reconstructions of the cervical spine were also generated.  COMPARISON:  None  FINDINGS: CT HEAD FINDINGS  The bony calvarium is intact. A scalp hematoma and soft tissue laceration are noted in the right posterior parietal region. Mild atrophic changes are noted. Mild chronic white matter ischemic change is seen. No findings to suggest acute hemorrhage, acute infarction or space-occupying mass lesion are noted.  CT CERVICAL SPINE FINDINGS  Seven cervical segments are well visualized. Vertebral body height is well maintained. Multilevel osteophytic changes are noted as well as facet hypertrophic changes. The hypertrophic facet changes are predominately noted on the left. Calcifications of the carotid and vertebral arteries are noted. No acute fracture or acute facet abnormality is seen.   IMPRESSION: CT of the head: Soft tissue injury in the right parietal region without acute intracranial abnormality.  CT of the cervical spine: Multilevel degenerative changes without acute abnormality. The facet hypertrophic changes are predominantly on the left.   Electronically Signed   By: Inez Catalina M.D.   On: 01/11/2014 10:08   Ct Cervical Spine Wo Contrast  01/11/2014   CLINICAL DATA:  Fall in shower this morning with dizziness  EXAM: CT HEAD WITHOUT CONTRAST  CT CERVICAL SPINE WITHOUT CONTRAST  TECHNIQUE: Multidetector CT imaging of the head and cervical spine was performed following the standard protocol without intravenous contrast. Multiplanar CT image reconstructions of the cervical spine were also generated.  COMPARISON:  None  FINDINGS: CT HEAD FINDINGS  The bony calvarium is intact. A scalp hematoma and soft tissue laceration are noted in the right posterior parietal region. Mild atrophic changes are noted. Mild chronic white matter ischemic change is seen. No findings to suggest acute hemorrhage, acute infarction or space-occupying mass lesion are noted.  CT CERVICAL SPINE FINDINGS  Seven cervical segments are well visualized. Vertebral body height is well maintained. Multilevel osteophytic changes are noted as well as facet hypertrophic changes. The hypertrophic facet changes are predominately noted on the left. Calcifications of the carotid and vertebral arteries are noted. No acute fracture or acute facet abnormality is seen.  IMPRESSION: CT of the head: Soft tissue injury in the right parietal region without acute intracranial abnormality.  CT of the cervical spine: Multilevel degenerative changes without acute abnormality. The facet hypertrophic changes are predominantly on the left.   Electronically Signed   By: Inez Catalina M.D.   On: 01/11/2014 10:08   Dg Chest Port 1 View  01/19/2014   CLINICAL DATA:  Aortic valve replacement.  EXAM: PORTABLE CHEST - 1 VIEW  COMPARISON:  01/18/2014.   FINDINGS: Interim removal of Swan-Ganz catheter, mediastinal drainage catheter, left chest tube. Right IJ she in good anatomic position. Prior CABG and aortic valve replacement. Stable cardiomegaly. Bibasilar pulmonary infiltrates and or edema. Small left pleural effusion. No pneumothorax.  IMPRESSION: 1. Interim removal of Swan-Ganz catheter, mediastinal drainage catheter, left chest tube. No pneumothorax. 2. Stable cardiomegaly.  Prior CABG and aortic valve replacement. 3. Bibasilar pulmonary infiltrates/edema and small pleural effusion again noted.   Electronically Signed   By: Marcello Moores  Register   On: 01/19/2014 08:05   Dg Chest Port 1 View  01/18/2014   CLINICAL DATA:  Status post CABG and aortic valve replacement.  EXAM: PORTABLE CHEST - 1 VIEW  COMPARISON:  Portable chest x-ray of January 17, 2014  FINDINGS: The right lung is well-expanded and clear. On the left there is retrocardiac subsegmental atelectasis or early infiltrate. The tiny left apical pneumothorax is not evident today. The cardiac silhouette is top-normal in size. The pulmonary vascularity is not engorged. The prosthetic aortic valve is visible. There are 7 intact sternal wires.  The trachea and esophagus have been extubated. The Swan-Ganz catheter tip lies in the region of the proximal right main pulmonary artery. A left-sided chest tube is unchanged with the tip projecting over the posterior medial aspect of the sixth rib. The mediastinal drain is unchanged with the tip projecting over the T5 region. A second lower chest tube -mediastinal drain on the left is stable with the tip lying in the medial costophrenic gutter.  IMPRESSION: Interval extubation of the trachea and esophagus. The support tubes and lines are in appropriate position. No significant pneumothorax is demonstrated today. There is left lower lobe atelectasis and/or pneumonia that is slightly more conspicuous today.   Electronically Signed   By: David  Martinique   On: 01/18/2014  08:15   Dg Chest Port 1 View  01/17/2014   CLINICAL DATA:  Post aortic valve replacement.  EXAM: PORTABLE CHEST - 1 VIEW  COMPARISON:  01/11/2014  FINDINGS: Changes of aortic valve replacement. Endotracheal tube is approximately 4 cm above the carina. Left chest tube is in place. Tiny left apical pneumothorax suspected. Swan-Ganz catheter tip is in the main pulmonary artery. NG tube is in the stomach.  Heart is normal size.  Minimal left base atelectasis.  No effusions.  IMPRESSION: Postoperative changes. Tiny left apical pneumothorax.  Support devices in expected position.   Electronically Signed   By: Rolm Baptise M.D.   On: 01/17/2014 14:45   Dg Chest Port 1 View  01/11/2014   CLINICAL DATA:  Syncopal episode.  Fall.  EXAM: PORTABLE CHEST - 1 VIEW  COMPARISON:  03/21/2011.  FINDINGS: Cardiopericardial silhouette within normal limits. Stable prominent costochondral calcification at the RIGHT first rib end. Monitoring leads project over the chest. No airspace disease. No pleural effusion. No displaced rib fracture or pneumothorax in this patient with a fall. Aortic arch atherosclerosis.  IMPRESSION: No active cardiopulmonary disease and no interval change.   Electronically Signed   By: Dereck Ligas M.D.   On: 01/11/2014 09:05    ASSESSMENT/PLAN:  Physical deconditioning - for home health PT CAD S/P CABG 4 - stable; continue aspirin 81 mg by mouth daily; follow-up with cardiothoracic surgery - Dr. Gilford Raid Severe aortic stenosis S/P AVR - stable; continue Ultram 50 mg 1-2 tabs by mouth every 4 hours when necessary and Tylenol 1 g by mouth every 6 hours when necessary for pain Atrial fibrillation - converted to sinus rhythm; continue amiodarone 200 mg by mouth daily and Toprol 37.5 mg by mouth daily Hypertension - well controlled; continue Norvasc 5 mg by mouth daily,Lisinopril 20 mg by mouth daily and Toprol 37.5 mg by mouth daily Hyperlipidemia - continue Lipitor 10 mg by mouth daily Chronic  thrombocytopenia - S/P transfusion of 2 units platelets in the hospital; 01/26/14 platelets 80 Chronic lymphocytic leukemia - follow-up with oncology   I have filled out patient's discharge paperwork and written prescriptions.  Patient will receive home health PT.  Total discharge time: Greater than 30 minutes  Discharge time involved coordination of the discharge process with social worker, nursing staff and therapy department. Medical justification for home health services verified.   [facility], NP [employer] [phone]

## 2014-02-06 ENCOUNTER — Encounter: Payer: Self-pay | Admitting: Physician Assistant

## 2014-02-06 ENCOUNTER — Ambulatory Visit (INDEPENDENT_AMBULATORY_CARE_PROVIDER_SITE_OTHER): Payer: Medicare Other | Admitting: Physician Assistant

## 2014-02-06 VITALS — BP 142/78 | HR 81 | Ht 60.0 in | Wt 96.0 lb

## 2014-02-06 DIAGNOSIS — Z952 Presence of prosthetic heart valve: Secondary | ICD-10-CM

## 2014-02-06 DIAGNOSIS — I2510 Atherosclerotic heart disease of native coronary artery without angina pectoris: Secondary | ICD-10-CM

## 2014-02-06 DIAGNOSIS — C9110 Chronic lymphocytic leukemia of B-cell type not having achieved remission: Secondary | ICD-10-CM

## 2014-02-06 DIAGNOSIS — D696 Thrombocytopenia, unspecified: Secondary | ICD-10-CM

## 2014-02-06 DIAGNOSIS — I6523 Occlusion and stenosis of bilateral carotid arteries: Secondary | ICD-10-CM

## 2014-02-06 DIAGNOSIS — Z954 Presence of other heart-valve replacement: Secondary | ICD-10-CM

## 2014-02-06 DIAGNOSIS — I10 Essential (primary) hypertension: Secondary | ICD-10-CM

## 2014-02-06 DIAGNOSIS — E785 Hyperlipidemia, unspecified: Secondary | ICD-10-CM

## 2014-02-06 MED ORDER — AMOXICILLIN 500 MG PO TABS: 2000.0000 mg | ORAL_TABLET | ORAL | Status: DC

## 2014-02-06 MED ORDER — AMOXICILLIN 500 MG PO TABS
500.0000 mg | ORAL_TABLET | ORAL | Status: DC
Start: 2014-02-06 — End: 2014-02-06

## 2014-02-06 MED ORDER — METOPROLOL SUCCINATE ER 50 MG PO TB24: 50.0000 mg | ORAL_TABLET | Freq: Every day | ORAL | Status: DC

## 2014-02-06 NOTE — Progress Notes (Signed)
Cardiology Office Note   Date:  02/06/2014   ID:  NIMCO BIVENS, DOB 1940/11/04, MRN [id number]  PCP:  Andria Frames, MD  Cardiologist:  Dr. Sherren Mocha (prior Dr. Rollene Fare patient)   Chief Complaint  Patient presents with  . Coronary Artery Disease    s/p CABG  . Aortic Stenosis    s/p AVR  . Atrial Fibrillation    post op tx with Amiodarone  . Hospitalization Follow-up     History of Present Illness: Nicole Mercado is a 73 y.o. female who presents for FU on the above.  She has a hx of CLL, chronic thrombocytopenia, HTN, carotid stenosis, s/p L CEA 6/13, severe AS.  She was seen by Dr. Servando Snare in 2013 for consideration of AVR.  She decided to seek a 2nd opinion in [place] and was lost to FU.  She was recently admitted to the hospital 1/6-1/19.  She presented with syncope.  She was noted to have worsening aortic stenosis.  She was also noted to be orthostatic and to have a UTI.  She was hydrated with fluids and placed on Ceftriaxone.  LHC was arranged and demonstrated significant CAD involving the distal LM, LAD, diagonal branches and obtuse marginal branch.  She subsequently underwent CABG + bioprosthetic AVR by Dr. Cyndia Bent 01/17/14 (L-LAD, S-OM1, S-D1, S-D2).  Postoperative course was complicated by volume excess treated with diuresis as well as atrial fibrillation with RVR. She converted to sinus rhythm on amiodarone.  She was also followed by oncology and was transfused 2 units of platelets due to PLT count of 18K.  She was DC to [facility].    She is now back home.  She is doing well. She is walking daily without dyspnea.  She is NYHA 2-2b.  She denies orthopnea, PND, edema.  She denies syncope or near-syncope.  Her chest incision is no longer sore.  She denies fever or cough.   Studies/Reports Reviewed Today:  - LHC (01/13/14):  dLM 70 extending into oLAD, mLAD 40-50, pD1 80, pD2 70, ostial/prox OM1 70,   - Echocardiogram (01/11/14):  Mild LVH, EF 60-65%, Gr 1 DD, possible  bicuspid AV, severe AS (mean 61 mmHg, peak 104 mmHg), mild MVP of post leaflet, mild MR, PASP 33 mmHg.  - Carotid US (8/15):  R < 40%, prox R ECA occluded, L CEA ok with velocities suggesting < 40%  - Carotid US (1/16):  Bilateral ICA 1-39%  - Nuclear stress test That (4/13): Normal perfusion, EF 74%   Past Medical History  Diagnosis Date  . Thrombocytopenia 11/19/2010  . Hypertension   . CLL (chronic lymphocytic leukemia)     slow leukemia---Dr  Murinson  . Arthritis   . Aortic stenosis     a. Echocardiogram (01/11/14):  Mild LVH, EF 60-65%, Gr 1 DD, possible bicuspid AV, severe AS (mean 61 mmHg, peak 104 mmHg), mild MVP of post leaflet, mild MR, PASP 33 mmHg.;  b. 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 Pancreatitis     h/o  . Carotid artery occlusion     a. s/p L CEA 2013;  b.  Carotid US (1/16):  Bilateral ICA 1-39%  . CAD (coronary artery disease)     a. LHC (01/13/14):  dLM 70 extending into oLAD, mLAD 40-50, pD1 80, pD2 70, ostial/prox OM1 70 >> CABG (L-LAD, S-OM1, S-D1, S-D2)    . Hx of cardiovascular stress test     a. Nuclear stress test That (4/13): Normal perfusion, EF 74%  .  Atrial fibrillation     post op after CABG+AVR >> Amiodarone    Past Surgical History  Procedure Laterality Date  . Cesarean section      FIVE  . Cholecystectomy    . Appendectomy    . Abdominal hysterectomy    . Tonsillectomy    . Endarterectomy  06/09/2011    Procedure: ENDARTERECTOMY CAROTID;  Surgeon: Rosetta Posner, MD;  Location: [facility] OR;  Service: Vascular;  Laterality: Left;  left carotid endarterectomy with patch angioplasty  . Carotid endarterectomy  06/09/11    LEFT  cea  . Left and right heart catheterization with coronary angiogram [name] 01/13/2014    Procedure: LEFT AND RIGHT HEART CATHETERIZATION WITH CORONARY ANGIOGRAM;  Surgeon: Jettie Booze, MD;  Location: [facility] CATH LAB;  Service: Cardiovascular;  Laterality: [name];  . Coronary artery bypass graft [name] 01/17/2014    Procedure: CORONARY ARTERY  BYPASS GRAFTING (CABG)TIMES FOUR USING LEFT INTERNAL MAMMARY ARTERY AND RIGHT SAPHENOUS VEIN HARVESTED ENDOSCOPICALLY;  Surgeon: Gaye Pollack, MD;  Location: [place];  Service: Open Heart Surgery;  Laterality: [name];  . Aortic valve replacement [name] 01/17/2014    Procedure: AORTIC VALVE REPLACEMENT (AVR);  Surgeon: Gaye Pollack, MD;  Location: [place];  Service: Open Heart Surgery;  Laterality: [name];  . Tee without cardioversion [name] 01/17/2014    Procedure: TRANSESOPHAGEAL ECHOCARDIOGRAM (TEE);  Surgeon: Gaye Pollack, MD;  Location: [place];  Service: Open Heart Surgery;  Laterality: [name];     Current Outpatient Prescriptions  Medication Sig Dispense Refill  . acetaminophen (TYLENOL) 500 MG tablet Take 1,000 mg by mouth every 6 (six) hours as needed. pain     . amiodarone (PACERONE) 200 MG tablet Take 1 tablet (200 mg total) by mouth 2 (two) times daily. For 3 days;then take Amiodarone 200 mg by mouth daily thereafter    . amLODipine (NORVASC) 5 MG tablet Take 1 tablet (5 mg total) by mouth daily.    Marland Kitchen aspirin EC 81 MG tablet Take 81 mg by mouth daily.      Marland Kitchen atorvastatin (LIPITOR) 10 MG tablet Take 1 tablet (10 mg total) by mouth daily at 6 PM.    . Calcium-Vitamin D 500-125 MG-UNIT TABS Take 1 tablet by mouth daily.     Marland Kitchen lisinopril (PRINIVIL,ZESTRIL) 20 MG tablet Take 20 mg by mouth daily.      . metoprolol succinate (TOPROL-XL) 50 MG 24 hr tablet Take 1 tablet (50 mg total) by mouth daily. Take 1 1/2 tab.Take with or immediately following a meal. 30 tablet 11  . Multiple Vitamins-Minerals (MULTIVITAMINS THER. W/MINERALS) TABS Take 1 tablet by mouth daily.      . traMADol (ULTRAM) 50 MG tablet Take 1-2 tablets (50-100 mg total) by mouth every 4 (four) hours as needed for moderate pain. 30 tablet 0  . amoxicillin (AMOXIL) 500 MG tablet Take 4 tablets (2,000 mg total) by mouth as directed. Take 4 tabs 60 minutes before dental procedure 4 tablet 2   No current facility-administered medications for  this visit.    Allergies:   Review of patient's allergies indicates no known allergies.    Social History:  The patient  reports that she has never smoked. She has never used smokeless tobacco. She reports that she does not drink alcohol or use illicit drugs.   Family History:  The patient's family history includes Heart attack in her mother; Heart disease in her father and mother; Hypertension in her father, mother, sister, and son;  Stroke in her paternal grandfather and paternal grandmother.    ROS:  Please see the history of present illness.   Otherwise, review of systems are positive for none.   All other systems are reviewed and negative.    PHYSICAL EXAM: VS:  BP 142/78 mmHg  Pulse 81  Ht 5' (1.524 m)  Wt 96 lb (43.545 kg)  BMI 18.75 kg/m2    Wt Readings from Last 3 Encounters:  02/06/14 96 lb (43.545 kg)  01/30/14 98 lb 3.2 oz (44.543 kg)  01/25/14 107 lb 2.3 oz (48.6 kg)     GEN: Well nourished, well developed, in no acute distress HEENT: normal Neck: no JVD, no masses Cardiac:  Normal S1/S2, RRR; no murmur, no rubs or gallops, no edema  Respiratory:  clear to auscultation bilaterally, no wheezing, rhonchi or rales. GI: soft, nontender, nondistended, + BS MS: no deformity or atrophy Skin: warm and dry  Neuro:  CNs II-XII intact, Strength and sensation are intact Psych: Normal affect   EKG:  EKG is ordered today.  It demonstrates:   NSR, HR 81, normal axis, NSSTTW changes, PRWP (anteroseptal Q waves)   Recent Labs: 01/11/2014: TSH 0.705 01/12/2014: ALT 13 01/18/2014: Magnesium 2.4 01/21/2014: BUN 18; Creatinine 1.05; Potassium 3.7; Sodium 135 01/24/2014: Hemoglobin 9.1*; Platelets 87*    Lipid Panel No results found for: CHOL, TRIG, HDL, CHOLHDL, VLDL, LDLCALC, LDLDIRECT    ASSESSMENT AND PLAN:  1.  Aortic Stenosis s/p Bioprosthetic AVR:  Doing well s/p CABG+AVR.  She is now at home and progressing well.  She is not interested in formal cardiac rehab.  I have  encouraged her to continue walking.  She will call if she changes her mind.  We discussed the importance of SBE prophylaxis.      -  SBE prophylaxis card given.  Rx for Amoxicillin 2 gm 60 minutes prior to procedure given.    -  Arrange FU 2D echocardiogram. 2.  Coronary Artery Disease s/p CABG:  As noted, she is progressing well.  She is not interested formal cardiac rehab.      -  Continue ASA, Lipitor, Amlodipine, Toprol-XL, Lisinopril. 3.  Post-Operative Atrial Fibrillation:  Maintaining NSR.  Continue Amiodarone 200 mg QD for now.  She is tolerating this medication.  I anticipate Amiodarone will be DCd when she is seen at her next FU visit (as long as she remains in NSR).   4.  Hypertension:  BP somewhat elevated.  HR is in the 80s.      -  Increase Toprol-XL to 50 mg QD. 5.  Hyperlipidemia:  Continue Lipitor 10 mg QD.  She is tolerating this medication.      -  Check Lipids and LFTs in 6 weeks.   6.  Carotid Stenosis:  FU with [facility] as planned. 7.  Chronic Lymphocytic Leukemia:  FU with Oncology as planned. 8.  Thrombocytopenia:  FU with Oncology as planned.   Current medicines are reviewed at length with the patient today.  The patient does not have concerns regarding medicines.  The following changes have been made:  As above.   Labs/ tests ordered today include:  Orders Placed This Encounter  Procedures  . Lipid Profile  . Hepatic function panel  . EKG 12-Lead  . 2D Echocardiogram without contrast     Disposition:   FU with Dr. Sherren Mocha  in 8 weeks   Signed, Richardson Dopp, PA-C, MHS 02/06/2014 1:02 PM    [address], [place], [place]  [address] Phone: [phone]; Fax: [phone]

## 2014-02-06 NOTE — Patient Instructions (Signed)
YOU HAVE BEEN GIVEN AN SBE CARD TO CARRY AT ALL TIMES FOR DENTAL PROCEDURE  INCREASE TOPROL CL TO 50 MG DAILY; NEW RX SENT IN FOR THE 50 MG TABLET  YOU HAVE BEEN GIVEN AN RX FOR AMOXICILLIN TO TAKE 92 MINUTES BEFORE ANY DENTAL PROCEDURE  Your physician has requested that you have an echocardiogram. Echocardiography is a painless test that uses sound waves to create images of your heart. It provides your doctor with information about the size and shape of your heart and how well your heart's chambers and valves are working. This procedure takes approximately one hour. There are no restrictions for this procedure.  Your physician recommends that you schedule a follow-up appointment in: [place]  Your physician recommends that you return for lab work in: [place]

## 2014-02-07 ENCOUNTER — Other Ambulatory Visit: Payer: Self-pay

## 2014-02-07 DIAGNOSIS — I10 Essential (primary) hypertension: Secondary | ICD-10-CM

## 2014-02-07 MED ORDER — METOPROLOL SUCCINATE ER 50 MG PO TB24: 50.0000 mg | ORAL_TABLET | Freq: Every day | ORAL | Status: DC

## 2014-02-13 ENCOUNTER — Encounter: Payer: Self-pay | Admitting: Physician Assistant

## 2014-02-13 ENCOUNTER — Telehealth: Payer: Self-pay | Admitting: *Deleted

## 2014-02-13 ENCOUNTER — Ambulatory Visit (HOSPITAL_COMMUNITY): Payer: Medicare Other | Attending: Physician Assistant

## 2014-02-13 DIAGNOSIS — Z954 Presence of other heart-valve replacement: Secondary | ICD-10-CM | POA: Diagnosis not present

## 2014-02-13 DIAGNOSIS — Z952 Presence of prosthetic heart valve: Secondary | ICD-10-CM

## 2014-02-13 NOTE — Progress Notes (Signed)
2D Echo completed. 02/13/2014

## 2014-02-13 NOTE — Telephone Encounter (Signed)
pt notified about echo results with verbal understanding 

## 2014-02-22 ENCOUNTER — Other Ambulatory Visit: Payer: Self-pay | Admitting: Hematology and Oncology

## 2014-02-22 DIAGNOSIS — C9110 Chronic lymphocytic leukemia of B-cell type not having achieved remission: Secondary | ICD-10-CM

## 2014-02-23 ENCOUNTER — Telehealth: Payer: Self-pay | Admitting: Hematology and Oncology

## 2014-02-23 ENCOUNTER — Other Ambulatory Visit (HOSPITAL_BASED_OUTPATIENT_CLINIC_OR_DEPARTMENT_OTHER): Payer: Medicare Other

## 2014-02-23 ENCOUNTER — Ambulatory Visit (HOSPITAL_BASED_OUTPATIENT_CLINIC_OR_DEPARTMENT_OTHER): Payer: Medicare Other | Admitting: Hematology and Oncology

## 2014-02-23 VITALS — BP 150/40 | HR 68 | Temp 97.6°F | Resp 18 | Ht 60.0 in | Wt 96.1 lb

## 2014-02-23 DIAGNOSIS — C9110 Chronic lymphocytic leukemia of B-cell type not having achieved remission: Secondary | ICD-10-CM

## 2014-02-23 DIAGNOSIS — I350 Nonrheumatic aortic (valve) stenosis: Secondary | ICD-10-CM

## 2014-02-23 DIAGNOSIS — D696 Thrombocytopenia, unspecified: Secondary | ICD-10-CM

## 2014-02-23 LAB — COMPREHENSIVE METABOLIC PANEL (CC13)
ALT: 11 U/L (ref 0–55)
AST: 20 U/L (ref 5–34)
Albumin: 4 g/dL (ref 3.5–5.0)
Alkaline Phosphatase: 87 U/L (ref 40–150)
Anion Gap: 11 mEq/L (ref 3–11)
BUN: 19.2 mg/dL (ref 7.0–26.0)
CALCIUM: 10.3 mg/dL (ref 8.4–10.4)
CHLORIDE: 104 meq/L (ref 98–109)
CO2: 24 meq/L (ref 22–29)
Creatinine: 1.4 mg/dL — ABNORMAL HIGH (ref 0.6–1.1)
EGFR: 38 mL/min/{1.73_m2} — ABNORMAL LOW (ref >90)
Glucose: 139 mg/dl (ref 70–140)
Potassium: 4.1 mEq/L (ref 3.5–5.1)
Sodium: 139 mEq/L (ref 136–145)
Total Bilirubin: 0.72 mg/dL (ref 0.20–1.20)
Total Protein: 6.6 g/dL (ref 6.4–8.3)

## 2014-02-23 LAB — CBC WITH DIFFERENTIAL/PLATELET
BASO%: 0.5 % (ref 0.0–2.0)
Basophils Absolute: 0 10*3/uL (ref 0.0–0.1)
EOS%: 0.4 % (ref 0.0–7.0)
Eosinophils Absolute: 0 10*3/uL (ref 0.0–0.5)
HCT: 36.3 % (ref 34.8–46.6)
HEMOGLOBIN: 11.8 g/dL (ref 11.6–15.9)
LYMPH%: 64.3 % — ABNORMAL HIGH (ref 14.0–49.7)
MCH: 31.4 pg (ref 25.1–34.0)
MCHC: 32.4 g/dL (ref 31.5–36.0)
MCV: 97.2 fL (ref 79.5–101.0)
MONO#: 0.2 10*3/uL (ref 0.1–0.9)
MONO%: 2.4 % (ref 0.0–14.0)
NEUT#: 2.8 10*3/uL (ref 1.5–6.5)
NEUT%: 32.4 % — ABNORMAL LOW (ref 38.4–76.8)
PLATELETS: 98 10*3/uL — AB (ref 145–400)
RBC: 3.74 10*6/uL (ref 3.70–5.45)
RDW: 18.2 % — ABNORMAL HIGH (ref 11.2–14.5)
WBC: 8.7 10*3/uL (ref 3.9–10.3)
lymph#: 5.6 10*3/uL — ABNORMAL HIGH (ref 0.9–3.3)

## 2014-02-23 LAB — TECHNOLOGIST REVIEW

## 2014-02-23 LAB — LACTATE DEHYDROGENASE (CC13): LDH: 294 U/L — AB (ref 125–245)

## 2014-02-23 NOTE — Telephone Encounter (Signed)
lvm for pt regarding to Aug appt....mailed pt appt sched ///avs and letter °

## 2014-02-24 NOTE — Assessment & Plan Note (Signed)
She is not symptomatic alert it is causing thrombocytopenia. The patient does not require treatment for now and recommend observation only.

## 2014-02-24 NOTE — Assessment & Plan Note (Signed)
She has surgical valve replacement and is doing well. I would defer follow-up to her surgeon. There is no contraindication for her to remain on antiplatelet agents.

## 2014-02-24 NOTE — Assessment & Plan Note (Signed)
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 It is mild and improved, compared from previous platelet count. The patient denies recent history of bleeding such as epistaxis, hematuria or hematochezia. She is asymptomatic from the thrombocytopenia. I will observe for now.

## 2014-02-24 NOTE — Progress Notes (Signed)
[facility] OFFICE PROGRESS NOTE  Patient Care Team: Andria Frames, MD as PCP - General (Family Medicine) Andria Frames, MD (Family Medicine) Rebecca Eaton, MD (Cardiology)  SUMMARY OF ONCOLOGIC HISTORY:   CLL (chronic lymphocytic leukemia)   03/21/2011 Initial Diagnosis CLL (chronic lymphocytic leukemia)   01/14/2014 - 01/31/2014 Hospital Admission The patient was hospitalized and was found to severe aortic stenosis causing syncopal episode. She received treatment with steroids with minimum success and required platelet transfusion.    INTERVAL HISTORY: Please see below for problem oriented charting. She returns as part of post hospital follow-up. She is doing well. Denies further syncopal episode. The patient denies any recent signs or symptoms of bleeding such as spontaneous epistaxis, hematuria or hematochezia. She denies new lymphadenopathy. No recent infection.  REVIEW OF SYSTEMS:   Constitutional: Denies fevers, chills or abnormal weight loss Eyes: Denies blurriness of vision Ears, nose, mouth, throat, and face: Denies mucositis or sore throat Respiratory: Denies cough, dyspnea or wheezes Cardiovascular: Denies palpitation, chest discomfort or lower extremity swelling Gastrointestinal:  Denies nausea, heartburn or change in bowel habits Skin: Denies abnormal skin rashes Lymphatics: Denies new lymphadenopathy or easy bruising Neurological:Denies numbness, tingling or new weaknesses Behavioral/Psych: Mood is stable, no new changes  All other systems were reviewed with the patient and are negative.  I have reviewed the past medical history, past surgical history, social history and family history with the patient and they are unchanged from previous note.  ALLERGIES:  has No Known Allergies.  MEDICATIONS:  Current Outpatient Prescriptions  Medication Sig Dispense Refill  . acetaminophen (TYLENOL) 500 MG tablet Take 1,000 mg by mouth every 6 (six) hours  as needed. pain     . amiodarone (PACERONE) 200 MG tablet Take 1 tablet (200 mg total) by mouth 2 (two) times daily. For 3 days;then take Amiodarone 200 mg by mouth daily thereafter    . amLODipine (NORVASC) 5 MG tablet Take 1 tablet (5 mg total) by mouth daily.    Marland Kitchen amoxicillin (AMOXIL) 500 MG tablet Take 4 tablets (2,000 mg total) by mouth as directed. Take 4 tabs 60 minutes before dental procedure 4 tablet 2  . aspirin EC 81 MG tablet Take 81 mg by mouth daily.      Marland Kitchen atorvastatin (LIPITOR) 10 MG tablet Take 1 tablet (10 mg total) by mouth daily at 6 PM.    . Calcium-Vitamin D 500-125 MG-UNIT TABS Take 1 tablet by mouth daily.     Marland Kitchen lisinopril (PRINIVIL,ZESTRIL) 20 MG tablet Take 20 mg by mouth daily.      . metoprolol succinate (TOPROL-XL) 50 MG 24 hr tablet Take 1 tablet (50 mg total) by mouth daily. 30 tablet 11  . Multiple Vitamins-Minerals (MULTIVITAMINS THER. W/MINERALS) TABS Take 1 tablet by mouth daily.      . traMADol (ULTRAM) 50 MG tablet Take 1-2 tablets (50-100 mg total) by mouth every 4 (four) hours as needed for moderate pain. 30 tablet 0   No current facility-administered medications for this visit.    PHYSICAL EXAMINATION: ECOG PERFORMANCE STATUS: 1 - Symptomatic but completely ambulatory  Filed Vitals:   02/23/14 1042  BP: 150/40  Pulse: 68  Temp: 97.6 F (36.4 C)  Resp: 18   Filed Weights   02/23/14 1042  Weight: 96 lb 1.6 oz (43.591 kg)    GENERAL:alert, no distress and comfortable SKIN: skin color, texture, turgor are normal, no rashes or significant lesions. Noted mild bruising but no petechiae EYES:  normal, Conjunctiva are pink and non-injected, sclera clear OROPHARYNX:no exudate, no erythema and lips, buccal mucosa, and tongue normal  NECK: supple, thyroid normal size, non-tender, without nodularity LYMPH:  no palpable lymphadenopathy in the cervical, axillary or inguinal LUNGS: clear to auscultation and percussion with normal breathing effort HEART:  regular rate & rhythm . Well-healed surgical scar. Mild systolic murmur in the left sternal border. No leg edema. ABDOMEN:abdomen soft, non-tender and normal bowel sounds Musculoskeletal:no cyanosis of digits and no clubbing  NEURO: alert & oriented x 3 with fluent speech, no focal motor/sensory deficits  LABORATORY DATA:  I have reviewed the data as listed    Component Value Date/Time   NA 139 02/23/2014 1032   NA 135 01/21/2014 0420   K 4.1 02/23/2014 1032   K 3.7 01/21/2014 0420   CL 95* 01/21/2014 0420   CL 105 05/20/2012 0823   CO2 24 02/23/2014 1032   CO2 29 01/21/2014 0420   GLUCOSE 139 02/23/2014 1032   GLUCOSE 129* 01/21/2014 0420   GLUCOSE 70 05/20/2012 0823   BUN 19.2 02/23/2014 1032   BUN 18 01/21/2014 0420   CREATININE 1.4* 02/23/2014 1032   CREATININE 1.05 01/21/2014 0420   CALCIUM 10.3 02/23/2014 1032   CALCIUM 8.7 01/21/2014 0420   PROT 6.6 02/23/2014 1032   PROT 5.1* 01/12/2014 0619   ALBUMIN 4.0 02/23/2014 1032   ALBUMIN 3.1* 01/12/2014 0619   AST 20 02/23/2014 1032   AST 23 01/12/2014 0619   ALT 11 02/23/2014 1032   ALT 13 01/12/2014 0619   ALKPHOS 87 02/23/2014 1032   ALKPHOS 59 01/12/2014 0619   BILITOT 0.72 02/23/2014 1032   BILITOT 0.6 01/12/2014 0619   GFRNONAA 51* 01/21/2014 0420   GFRAA 60* 01/21/2014 0420    No results found for: SPEP, UPEP  Lab Results  Component Value Date   WBC 8.7 02/23/2014   NEUTROABS 2.8 02/23/2014   HGB 11.8 02/23/2014   HCT 36.3 02/23/2014   MCV 97.2 02/23/2014   PLT 98* 02/23/2014      Chemistry      Component Value Date/Time   NA 139 02/23/2014 1032   NA 135 01/21/2014 0420   K 4.1 02/23/2014 1032   K 3.7 01/21/2014 0420   CL 95* 01/21/2014 0420   CL 105 05/20/2012 0823   CO2 24 02/23/2014 1032   CO2 29 01/21/2014 0420   BUN 19.2 02/23/2014 1032   BUN 18 01/21/2014 0420   CREATININE 1.4* 02/23/2014 1032   CREATININE 1.05 01/21/2014 0420      Component Value Date/Time   CALCIUM 10.3 02/23/2014  1032   CALCIUM 8.7 01/21/2014 0420   ALKPHOS 87 02/23/2014 1032   ALKPHOS 59 01/12/2014 0619   AST 20 02/23/2014 1032   AST 23 01/12/2014 0619   ALT 11 02/23/2014 1032   ALT 13 01/12/2014 0619   BILITOT 0.72 02/23/2014 1032   BILITOT 0.6 01/12/2014 0619      ASSESSMENT & PLAN:  CLL (chronic lymphocytic leukemia) She is not symptomatic alert it is causing thrombocytopenia. The patient does not require treatment for now and recommend observation only.   Aortic stenosis She has surgical valve replacement and is doing well. I would defer follow-up to her surgeon. There is no contraindication for her to remain on antiplatelet agents.   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 It is mild and improved, compared from previous platelet count. The patient denies recent history of bleeding such as epistaxis, hematuria or  hematochezia. She is asymptomatic from the thrombocytopenia. I will observe for now.      Orders Placed This Encounter  Procedures  . CBC with Differential/Platelet    Standing Status: Future     Number of Occurrences:      Standing Expiration Date: 03/30/2015   All questions were answered. The patient knows to call the clinic with any problems, questions or concerns. No barriers to learning was detected. I spent 15 minutes counseling the patient face to face. The total time spent in the appointment was 20 minutes and more than 50% was on counseling and review of test results     [facility], [place], MD 02/24/2014 2:26 PM

## 2014-02-28 ENCOUNTER — Other Ambulatory Visit: Payer: Self-pay | Admitting: Surgery

## 2014-02-28 DIAGNOSIS — Z952 Presence of prosthetic heart valve: Secondary | ICD-10-CM

## 2014-03-01 ENCOUNTER — Encounter: Payer: Self-pay | Admitting: Surgery

## 2014-03-01 ENCOUNTER — Ambulatory Visit
Admission: RE | Admit: 2014-03-01 | Discharge: 2014-03-01 | Disposition: A | Discharge status: Home / Self Care | Payer: Medicare Other | Source: Ambulatory Visit | Attending: Surgery | Admitting: Surgery

## 2014-03-01 ENCOUNTER — Other Ambulatory Visit: Payer: Self-pay | Admitting: *Deleted

## 2014-03-01 ENCOUNTER — Ambulatory Visit (INDEPENDENT_AMBULATORY_CARE_PROVIDER_SITE_OTHER): Payer: Self-pay | Admitting: Surgery

## 2014-03-01 VITALS — BP 137/54 | HR 64 | Resp 16 | Ht 60.0 in | Wt 94.0 lb

## 2014-03-01 DIAGNOSIS — E785 Hyperlipidemia, unspecified: Secondary | ICD-10-CM

## 2014-03-01 DIAGNOSIS — Z951 Presence of aortocoronary bypass graft: Secondary | ICD-10-CM

## 2014-03-01 DIAGNOSIS — I10 Essential (primary) hypertension: Secondary | ICD-10-CM

## 2014-03-01 DIAGNOSIS — I2510 Atherosclerotic heart disease of native coronary artery without angina pectoris: Secondary | ICD-10-CM

## 2014-03-01 DIAGNOSIS — Z952 Presence of prosthetic heart valve: Secondary | ICD-10-CM

## 2014-03-01 DIAGNOSIS — I350 Nonrheumatic aortic (valve) stenosis: Secondary | ICD-10-CM

## 2014-03-01 DIAGNOSIS — Z954 Presence of other heart-valve replacement: Secondary | ICD-10-CM

## 2014-03-01 IMAGING — CR DG CHEST 2V
2 series · 2 of 2 positions shown · non-contrast
Comparison: Portable chest x-ray of [DATE]

CLINICAL DATA: Post aortic valve replacement, followup

EXAM:
CHEST  2 VIEW

[w chest pa]
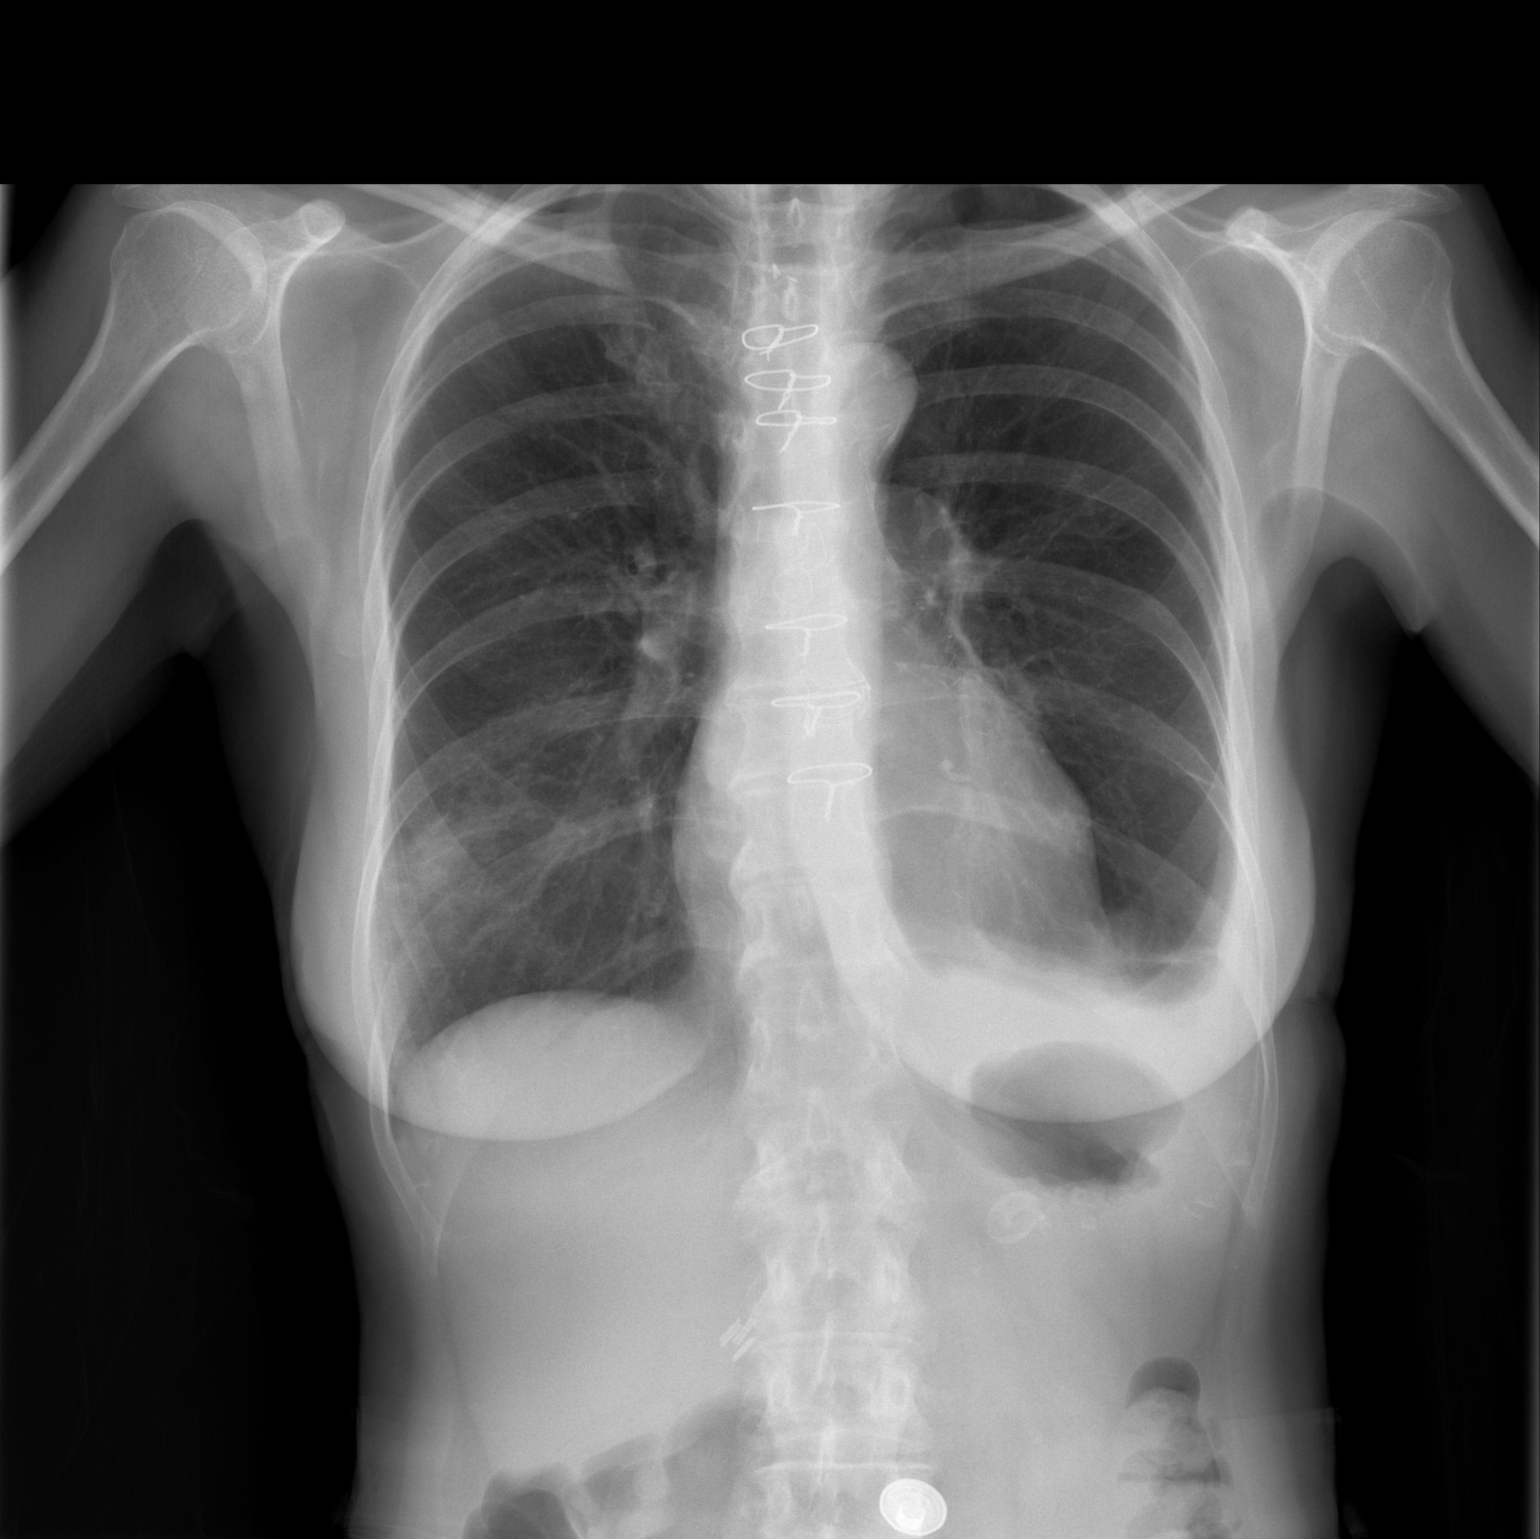

[w chest lat]
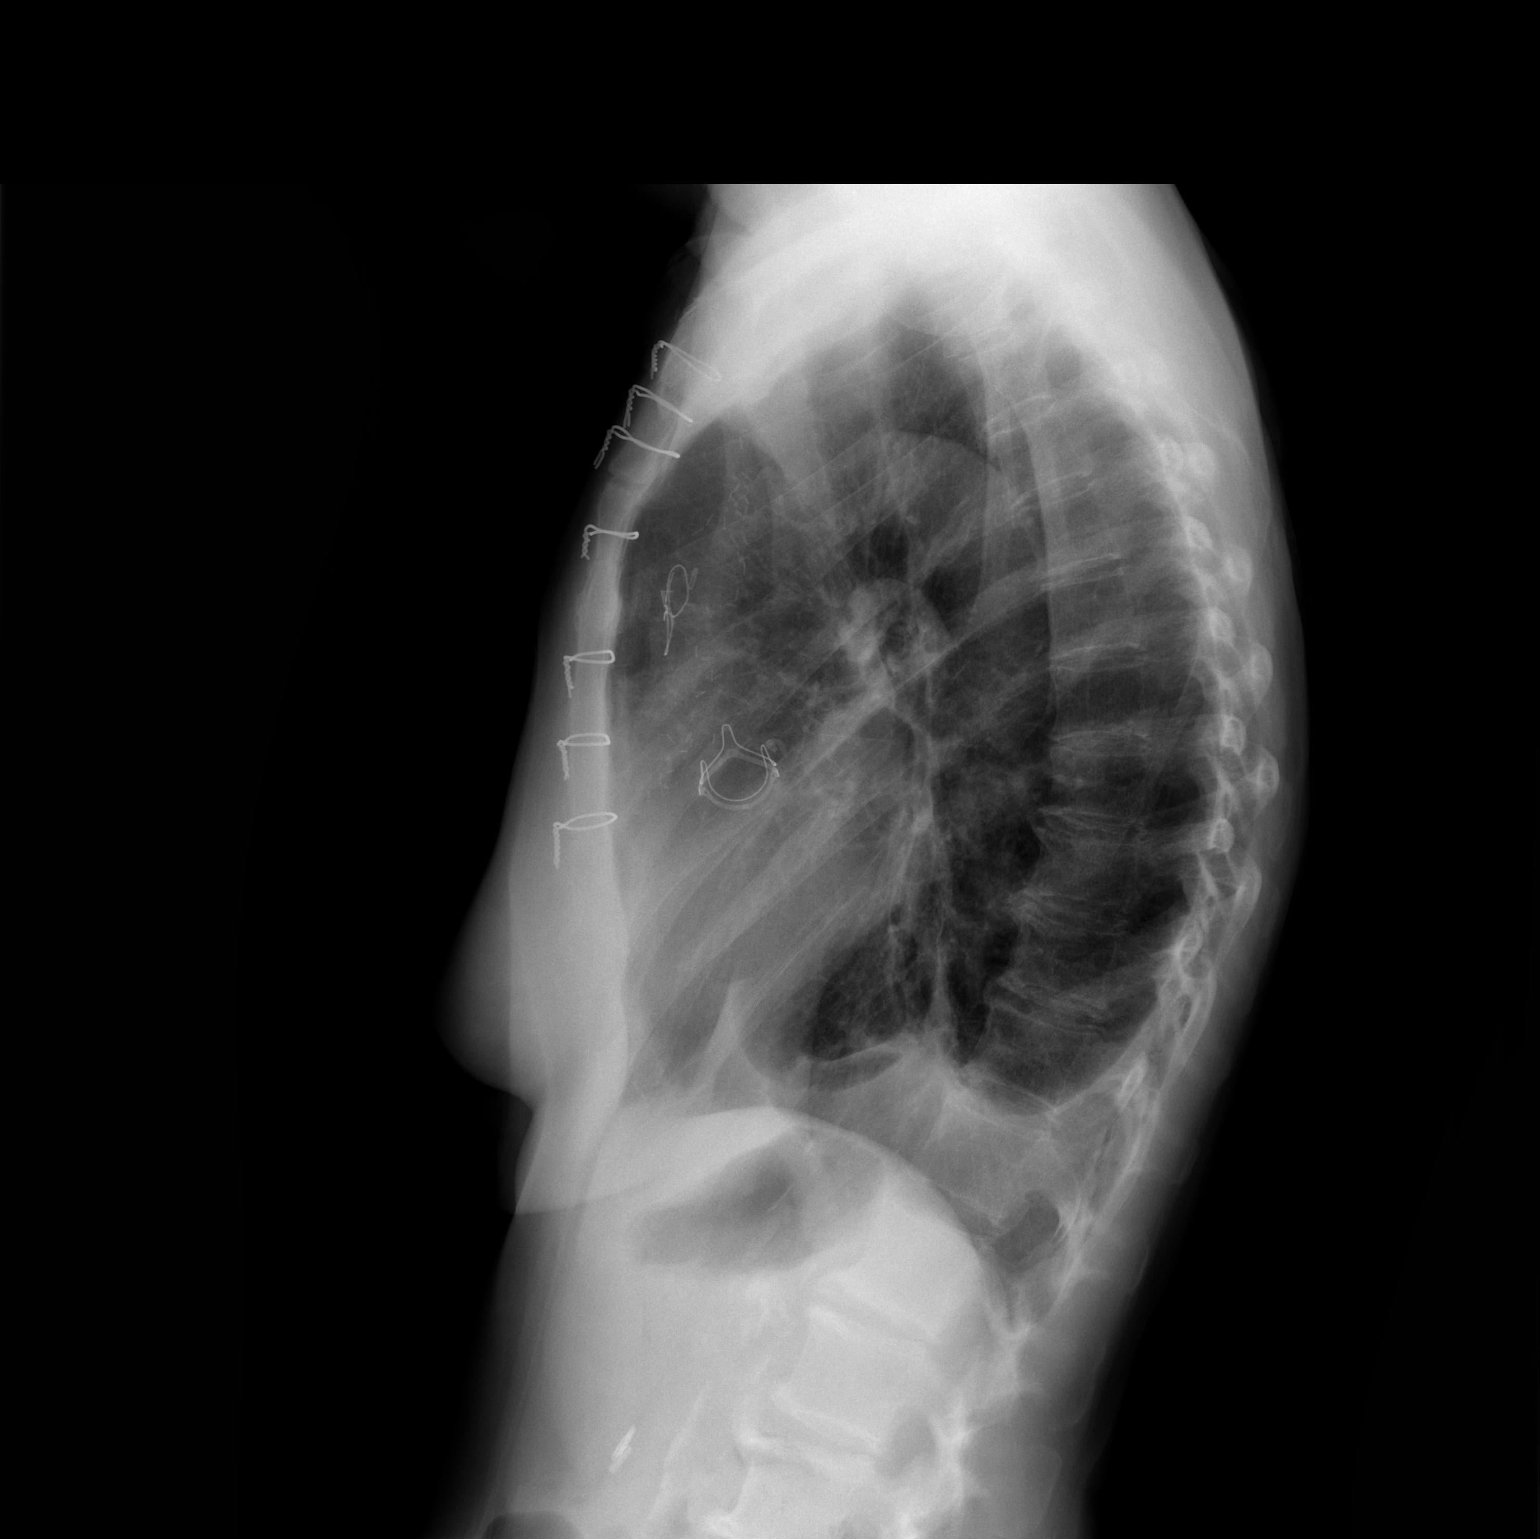

[2 of 2 positions shown; findings below may reference images not displayed]

FINDINGS: At the lungs are better aerated. There is improvement in bibasilar
atelectasis with a small left pleural effusion having decreased
somewhat in volume. The heart is unchanged in size and an aortic
valve replacement is noted. There are degenerative changes
throughout the thoracic spine.
IMPRESSION: Improved aeration with decrease in small left pleural effusion and
bibasilar atelectasis.

## 2014-03-01 MED ORDER — AMLODIPINE BESYLATE 5 MG PO TABS: 5.0000 mg | ORAL_TABLET | Freq: Every day | ORAL | Status: DC

## 2014-03-01 MED ORDER — LISINOPRIL 20 MG PO TABS: 20.0000 mg | ORAL_TABLET | Freq: Every day | ORAL | Status: DC

## 2014-03-01 MED ORDER — ATORVASTATIN CALCIUM 10 MG PO TABS: 10.0000 mg | ORAL_TABLET | Freq: Every day | ORAL | Status: DC

## 2014-03-01 MED ORDER — METOPROLOL SUCCINATE ER 25 MG PO TB24: 37.5000 mg | ORAL_TABLET | Freq: Every day | ORAL | Status: DC

## 2014-03-01 NOTE — Progress Notes (Signed)
     HPI: Patient returns for routine postoperative follow-up having undergone CABG x 4 and AVR using a pericardial valve on 01/17/2014. The patient's early postoperative recovery while in the hospital was notable for development of postop atrial fibrillation converted with amiodarone. Since hospital discharge the patient reports that she feels well. She is walking 30 minutes per day without problems. She has not noted any palpitations or irregular rhythm.   Current Outpatient Prescriptions  Medication Sig Dispense Refill  . acetaminophen (TYLENOL) 500 MG tablet Take 1,000 mg by mouth every 6 (six) hours as needed. pain     . amiodarone (PACERONE) 200 MG tablet Take 1 tablet (200 mg total) by mouth 2 (two) times daily. For 3 days;then take Amiodarone 200 mg by mouth daily thereafter    . amLODipine (NORVASC) 5 MG tablet Take 1 tablet (5 mg total) by mouth daily. 30 tablet 0  . amoxicillin (AMOXIL) 500 MG tablet Take 4 tablets (2,000 mg total) by mouth as directed. Take 4 tabs 60 minutes before dental procedure 4 tablet 2  . aspirin EC 81 MG tablet Take 81 mg by mouth daily.      . Calcium-Vitamin D 500-125 MG-UNIT TABS Take 1 tablet by mouth daily.     Marland Kitchen lisinopril (PRINIVIL,ZESTRIL) 20 MG tablet Take 1 tablet (20 mg total) by mouth daily. 30 tablet 0  . Multiple Vitamins-Minerals (MULTIVITAMINS THER. W/MINERALS) TABS Take 1 tablet by mouth daily.      Marland Kitchen atorvastatin (LIPITOR) 10 MG tablet Take 1 tablet (10 mg total) by mouth daily at 6 PM. 30 tablet 0  . metoprolol succinate (TOPROL-XL) 25 MG 24 hr tablet Take 1.5 tablets (37.5 mg total) by mouth daily. Take one and a half tablets once daily 90 tablet 0   No current facility-administered medications for this visit.    Physical Exam: BP 137/54 mmHg  Pulse 64  Resp 16  Ht 5' (1.524 m)  Wt 94 lb (42.638 kg)  BMI 18.36 kg/m2  SpO2 96% She looks well. Lung exam is clear. Cardiac exam shows a regular rate and rhythm with normal heart  sounds. There is a 1/6 systolic flow murmur across the prosthetic valve Chest incision is healing well and sternum is stable. The leg incisions are healing well and there is no peripheral edema.   Diagnostic Tests:  CLINICAL DATA: Post aortic valve replacement, followup  EXAM: CHEST 2 VIEW  COMPARISON: Portable chest x-ray of 01/19/2014  FINDINGS: At the lungs are better aerated. There is improvement in bibasilar atelectasis with a small left pleural effusion having decreased somewhat in volume. The heart is unchanged in size and an aortic valve replacement is noted. There are degenerative changes throughout the thoracic spine.  IMPRESSION: Improved aeration with decrease in small left pleural effusion and bibasilar atelectasis.   Electronically Signed  By: Ivar Drape M.D.  On: 03/01/2014 14:02   Impression:  Overall I think she is doing well. I encouraged her to continue walking.  I told her that she could drive her car but should not lift anything heavier than 10 lbs for three months postop. She has an appt to see Dr. Burt Knack in a few weeks. I did renew her medications today but told her to stop the amiodarone.    Plan:  She will follow up with Dr. Burt Knack for her cardiology care and will return to see me if she develops any problems with her incisions.

## 2014-03-19 NOTE — Progress Notes (Signed)
Patient ID: Nicole Mercado, female   DOB: [birth year], 74 y.o.   MRN: [id number]     [facility]   PCP: Andria Frames, MD   No Known Allergies  Chief Complaint  Patient presents with  . New Admit To SNF     HPI:  74 year old patient is here for short term rehabilitation post hospital admission from 01/11/14-01/24/14 with severe aortic stenosis. Cardiac catheterization showed 70% stenosis distal LM extending into the ostium of the LAD. D1 has 80% proximal stenosis and D2 has 70% proximal stenosis. The OM1 has 70% ostial stenosis. She underwent CABG 4 and AVR. She then had A. fib with RVR and started on amiodarone and lopressor.   Patient is seen today in her room today. She denies any concerns. She is working with therapy team She has past medical history of chronic lymphocytic leukemia, chronic thrombocytopenia, hypertension and severe aortic stenosis.    Review of Systems:  Constitutional: Negative for fever, chills, malaise/fatigue and diaphoresis.  HENT: Negative for headache, congestion Respiratory: Negative for cough, shortness of breath and wheezing.   Cardiovascular: Negative for chest pain, palpitations, leg swelling.  Gastrointestinal: Negative for heartburn, nausea, vomiting, abdominal pain Genitourinary: Negative for dysuria  Musculoskeletal: Negative for back pain, falls Skin: Negative for itching, rash.  Neurological: Negative for dizziness, tingling, focal weakness Psychiatric/Behavioral: Negative for depression.    Past Medical History  Diagnosis Date  . Thrombocytopenia 11/19/2010  . Hypertension   . CLL (chronic lymphocytic leukemia)     slow leukemia---Dr  Murinson  . Arthritis   . Aortic stenosis     a. Echocardiogram (01/11/14):  Mild LVH, EF 60-65%, Gr 1 DD, possible bicuspid AV, severe AS (mean 61 mmHg, peak 104 mmHg), mild MVP of post leaflet, mild MR, PASP 33 mmHg.;  b. 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 Pancreatitis    h/o  . Carotid artery occlusion     a. s/p L CEA 2013;  b.  Carotid US (1/16):  Bilateral ICA 1-39%  . CAD (coronary artery disease)     a. LHC (01/13/14):  dLM 70 extending into oLAD, mLAD 40-50, pD1 80, pD2 70, ostial/prox OM1 70 >> CABG (L-LAD, S-OM1, S-D1, S-D2)    . Hx of cardiovascular stress test     a. Nuclear stress test That (4/13): Normal perfusion, EF 74%  . Atrial fibrillation     post op after CABG+AVR >> Amiodarone  . Hx of echocardiogram     Echo (2/16):  Mild LVH, EF 60-65%, no RWMA, Gr 2 DD, AVR ok (mean 9 mmHg), trivial AI, mild MR, mild LAE, PASP 40 mmHg   Past Surgical History  Procedure Laterality Date  . Cesarean section      FIVE  . Cholecystectomy    . Appendectomy    . Abdominal hysterectomy    . Tonsillectomy    . Endarterectomy  06/09/2011    Procedure: ENDARTERECTOMY CAROTID;  Surgeon: Rosetta Posner, MD;  Location: [facility] OR;  Service: Vascular;  Laterality: Left;  left carotid endarterectomy with patch angioplasty  . Carotid endarterectomy  06/09/11    LEFT  cea  . Left and right heart catheterization with coronary angiogram [name] 01/13/2014    Procedure: LEFT AND RIGHT HEART CATHETERIZATION WITH CORONARY ANGIOGRAM;  Surgeon: Jettie Booze, MD;  Location: [facility] CATH LAB;  Service: Cardiovascular;  Laterality: [name];  . Coronary artery bypass graft [name] 01/17/2014    Procedure: CORONARY ARTERY BYPASS GRAFTING (CABG)TIMES  FOUR USING LEFT INTERNAL MAMMARY ARTERY AND RIGHT SAPHENOUS VEIN HARVESTED ENDOSCOPICALLY;  Surgeon: Gaye Pollack, MD;  Location: [place] OR;  Service: Open Heart Surgery;  Laterality: [name];  . Aortic valve replacement [name] 01/17/2014    Procedure: AORTIC VALVE REPLACEMENT (AVR);  Surgeon: Gaye Pollack, MD;  Location: [place];  Service: Open Heart Surgery;  Laterality: [name];  . Tee without cardioversion [name] 01/17/2014    Procedure: TRANSESOPHAGEAL ECHOCARDIOGRAM (TEE);  Surgeon: Gaye Pollack, MD;  Location: [place];  Service: Open Heart Surgery;  Laterality: [name];     Social History:   reports that she has never smoked. She has never used smokeless tobacco. She reports that she does not drink alcohol or use illicit drugs.  Family History  Problem Relation Age of Onset  . Heart disease Mother     in her 66s  . Hypertension Mother   . Heart attack Mother   . Heart disease Father   . Hypertension Father   . Hypertension Sister   . Hypertension Son   . Stroke Paternal Grandmother   . Stroke Paternal Grandfather     Medications: Medication reviewed. See MAR   Physical Exam: Filed Vitals:   01/26/14 1658  BP: 142/70  Pulse: 84  Temp: 98.9 F (37.2 C)  Resp: 18  SpO2: 94%    General- elderly female, in no acute distress Head- normocephalic, atraumatic Throat- moist mucus membrane Neck- no cervical lymphadenopathy Cardiovascular- normal s1,s2, no murmurs/, palpable dorsalis pedis, no leg edema Respiratory- bilateral clear to auscultation, no wheeze, no rhonchi, no crackles, no use of accessory muscles Abdomen- bowel sounds present, soft, non tender Musculoskeletal- able to move all 4 extremities, generalized weakness Neurological- no focal deficit Skin- warm and dry, right groin area and inner knee has steri-strips, sternal incision open to air, dry and healing well Psychiatry- alert and oriented with normal mood and affect    Labs reviewed: reviewed  Assessment/Plan  Physical deconditioning Will have her work with physical therapy and occupational therapy team to help with gait training and muscle strengthening exercises.fall precautions. Skin care. Encourage to be out of bed.   Severe aortic stenosis S/p AVR. Continue prn ultram and tylenol for pain  CAD  S/P CABG 4. Remains chest pain free. Continue toprol 37.5 mg daily, lisinopril 20 mg daily, aspirin 81 mg daily and lipitor 10 mg daily. Has follow-up with cardiothoracic surgery - Dr. Gilford Raid  Atrial fibrillation  Rate controlled. continue amiodarone 200 mg bid  until today and then once a day. Continue Toprol 37.5 mg daily  Hypertension continue Norvasc 5 mg daily, Lisinopril 20 mg daily and Toprol 37.5 mg daily.  Hyperlipidemia  continue Lipitor 10 mg daily  Chronic thrombocytopenia S/P transfusion of 2 units platelets in the hospital. Monitor cbc  Chronic lymphocytic leukemia follow-up with oncology   Goals of care:  Short-term rehabilitation   Labs/test ordered:  CBC, CMP    Goals of care: short term rehabilitation   Labs/tests ordered: cbc, cmp  Family/ staff Communication: reviewed care plan with patient and nursing supervisor    Blanchie Serve, MD  [facility] [phone] (Monday-Friday 8 am - 5 pm) [phone] (afterhours)

## 2014-03-23 ENCOUNTER — Encounter: Payer: Self-pay | Admitting: Cardiovascular Disease

## 2014-03-23 ENCOUNTER — Other Ambulatory Visit (INDEPENDENT_AMBULATORY_CARE_PROVIDER_SITE_OTHER): Payer: Medicare Other | Admitting: *Deleted

## 2014-03-23 ENCOUNTER — Ambulatory Visit (INDEPENDENT_AMBULATORY_CARE_PROVIDER_SITE_OTHER): Payer: Medicare Other | Admitting: Cardiovascular Disease

## 2014-03-23 VITALS — BP 124/60 | HR 81 | Ht 60.0 in | Wt 95.4 lb

## 2014-03-23 DIAGNOSIS — I10 Essential (primary) hypertension: Secondary | ICD-10-CM

## 2014-03-23 DIAGNOSIS — E785 Hyperlipidemia, unspecified: Secondary | ICD-10-CM

## 2014-03-23 MED ORDER — ASPIRIN EC 81 MG PO TBEC: 81.0000 mg | DELAYED_RELEASE_TABLET | Freq: Every day | ORAL | Status: DC

## 2014-03-23 MED ORDER — AMLODIPINE BESYLATE 5 MG PO TABS: 5.0000 mg | ORAL_TABLET | Freq: Every day | ORAL | Status: DC

## 2014-03-23 MED ORDER — LISINOPRIL 20 MG PO TABS: 20.0000 mg | ORAL_TABLET | Freq: Every day | ORAL | Status: DC

## 2014-03-23 MED ORDER — ATORVASTATIN CALCIUM 10 MG PO TABS: 10.0000 mg | ORAL_TABLET | Freq: Every day | ORAL | Status: DC

## 2014-03-23 MED ORDER — METOPROLOL SUCCINATE ER 50 MG PO TB24: 50.0000 mg | ORAL_TABLET | Freq: Every day | ORAL | Status: DC

## 2014-03-23 NOTE — Progress Notes (Signed)
Cardiology Office Note   Date:  03/23/2014   ID:  Nicole Mercado, DOB 06/13/1940, MRN [id number]  PCP:  Nicole Frames, MD  Cardiologist:  Nicole Mocha, MD    Chief Complaint  Patient presents with  . Aortic Stenosis    History of Present Illness: Nicole Mercado is a 74 y.o. female who presents for follow-up of coronary and valvular heart disease. The patient has a history of CLL with chronic thrombocytopenia, hypertension, carotid stenosis status post left carotid endarterectomy in 2013, and severe aortic stenosis. She was hospitalized in January 2016 with syncope. She was found to have very severe aortic stenosis as well as multivessel coronary artery disease with involvement of the distal left main, LAD, and circumflex branches. She underwent CABG and bioprosthetic aortic valve replacement by Nicole. Cyndia Mercado with a LIMA to LAD, saphenous vein graft to OM1, saphenous vein graft to diagonals, and bioprosthetic aortic valve replacement. Postoperatively she developed atrial fibrillation and was treated with amiodarone. She was seen by Nicole Mercado in February and was recovering well. She presents today for cardiology follow-up.  The patient feels well. She denies chest pain, shortness of breath, leg swelling, or heart palpitations. She walked 2 miles earlier this week with a break, but had no symptoms at that level of exertion. She denies any bleeding problems. She does have some fatigue in the afternoon, but no other specific complaints.  Past Medical History  Diagnosis Date  . Thrombocytopenia 11/19/2010  . Hypertension   . CLL (chronic lymphocytic leukemia)     slow leukemia---Nicole  Mercado  . Arthritis   . Aortic stenosis     a. Echocardiogram (01/11/14):  Mild LVH, EF 60-65%, Gr 1 DD, possible bicuspid AV, severe AS (mean 61 mmHg, peak 104 mmHg), mild MVP of post leaflet, mild MR, PASP 33 mmHg.;  b. 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 Pancreatitis     h/o  . Carotid artery occlusion     a.  s/p L CEA 2013;  b.  Carotid US (1/16):  Bilateral ICA 1-39%  . CAD (coronary artery disease)     a. LHC (01/13/14):  dLM 70 extending into oLAD, mLAD 40-50, pD1 80, pD2 70, ostial/prox OM1 70 >> CABG (L-LAD, S-OM1, S-D1, S-D2)    . Hx of cardiovascular stress test     a. Nuclear stress test That (4/13): Normal perfusion, EF 74%  . Atrial fibrillation     post op after CABG+AVR >> Amiodarone  . Hx of echocardiogram     Echo (2/16):  Mild LVH, EF 60-65%, no RWMA, Gr 2 DD, AVR ok (mean 9 mmHg), trivial AI, mild MR, mild LAE, PASP 40 mmHg    Past Surgical History  Procedure Laterality Date  . Cesarean section      FIVE  . Cholecystectomy    . Appendectomy    . Abdominal hysterectomy    . Tonsillectomy    . Endarterectomy  06/09/2011    Procedure: ENDARTERECTOMY CAROTID;  Surgeon: Nicole Posner, MD;  Location: [facility] OR;  Service: Vascular;  Laterality: Left;  left carotid endarterectomy with patch angioplasty  . Carotid endarterectomy  06/09/11    LEFT  cea  . Left and right heart catheterization with coronary angiogram [name] 01/13/2014    Procedure: LEFT AND RIGHT HEART CATHETERIZATION WITH CORONARY ANGIOGRAM;  Surgeon: Nicole Booze, MD;  Location: [facility] CATH LAB;  Service: Cardiovascular;  Laterality: [name];  . Coronary artery bypass graft [name] 01/17/2014    Procedure:  CORONARY ARTERY BYPASS GRAFTING (CABG)TIMES FOUR USING LEFT INTERNAL MAMMARY ARTERY AND RIGHT SAPHENOUS VEIN HARVESTED ENDOSCOPICALLY;  Surgeon: Nicole Pollack, MD;  Location: [place] OR;  Service: Open Heart Surgery;  Laterality: [name];  . Aortic valve replacement [name] 01/17/2014    Procedure: AORTIC VALVE REPLACEMENT (AVR);  Surgeon: Nicole Pollack, MD;  Location: [place];  Service: Open Heart Surgery;  Laterality: [name];  . Tee without cardioversion [name] 01/17/2014    Procedure: TRANSESOPHAGEAL ECHOCARDIOGRAM (TEE);  Surgeon: Nicole Pollack, MD;  Location: [place];  Service: Open Heart Surgery;  Laterality: [name];    Current Outpatient Prescriptions    Medication Sig Dispense Refill  . acetaminophen (TYLENOL) 500 MG tablet Take 1,000 mg by mouth every 6 (six) hours as needed. pain     . amLODipine (NORVASC) 5 MG tablet Take 1 tablet (5 mg total) by mouth daily. 90 tablet 3  . atorvastatin (LIPITOR) 10 MG tablet Take 1 tablet (10 mg total) by mouth daily at 6 PM. 90 tablet 3  . lisinopril (PRINIVIL,ZESTRIL) 20 MG tablet Take 1 tablet (20 mg total) by mouth daily. 90 tablet 3  . metoprolol succinate (TOPROL-XL) 50 MG 24 hr tablet Take 1 tablet (50 mg total) by mouth daily. Take with or immediately following a meal. 90 tablet 3  . amoxicillin (AMOXIL) 500 MG tablet Take 4 tablets (2,000 mg total) by mouth as directed. Take 4 tabs 60 minutes before dental procedure (Patient not taking: Reported on 03/23/2014) 4 tablet 2  . aspirin EC 81 MG tablet Take 1 tablet (81 mg total) by mouth daily.     No current facility-administered medications for this visit.    Allergies:   Review of patient's allergies indicates no known allergies.   Social History:  The patient  reports that she has never smoked. She has never used smokeless tobacco. She reports that she does not drink alcohol or use illicit drugs.   Family History:  The patient's  family history includes Heart attack in her mother; Heart disease in her father and mother; Hypertension in her father, mother, sister, and son; Stroke in her paternal grandfather and paternal grandmother.    ROS:  Please see the history of present illness.  Otherwise, review of systems is positive for fatigue.  All other systems are reviewed and negative.    PHYSICAL EXAM: VS:  BP 124/60 mmHg  Pulse 81  Ht 5' (1.524 m)  Wt 95 lb 6.4 oz (43.273 kg)  BMI 18.63 kg/m2 , BMI Body mass index is 18.63 kg/(m^2). GEN: Well nourished, well developed, in no acute distress HEENT: normal Neck: no JVD, no masses. No carotid bruits Cardiac: regular rate and rhythm with a soft systolic ejection murmur at the left sternal  border                Respiratory:  clear to auscultation bilaterally, normal work of breathing GI: soft, nontender, nondistended, + BS MS: no deformity or atrophy Ext: no pretibial edema, pedal pulses 2+= bilaterally Skin: warm and dry, no rash Neuro:  Strength and sensation are intact Psych: euthymic mood, full affect  EKG:  EKG is not ordered today.  Recent Labs: 01/11/2014: TSH 0.705 01/18/2014: Magnesium 2.4 02/23/2014: ALT 11; BUN 19.2; Creatinine 1.4*; Hemoglobin 11.8; Platelets 98*; Potassium 4.1; Sodium 139   Lipid Panel  No results found for: CHOL, TRIG, HDL, CHOLHDL, VLDL, LDLCALC, LDLDIRECT    Wt Readings from Last 3 Encounters:  03/23/14 95 lb 6.4 oz (43.273 kg)  03/01/14 94 lb (42.638 kg)  02/23/14 96 lb 1.6 oz (43.591 kg)     Cardiac Studies Reviewed: - LHC (01/13/14): dLM 70 extending into oLAD, mLAD 40-50, pD1 80, pD2 70, ostial/prox OM1 70,  - Echocardiogram (01/11/14): Mild LVH, EF 60-65%, Gr 1 DD, possible bicuspid AV, severe AS (mean 61 mmHg, peak 104 mmHg), mild MVP of post leaflet, mild MR, PASP 33 mmHg. - Carotid US (8/15): R < 40%, prox R ECA occluded, L CEA ok with velocities suggesting < 40% - Carotid US (1/16): Bilateral ICA 1-39%  -2-D echocardiogram 02/13/2014 (postoperative study): Study Conclusions  - Left ventricle: The cavity size was normal. Wall thickness was increased in a pattern of mild LVH. Systolic function was normal. The estimated ejection fraction was in the range of 60% to 65%. Wall motion was normal; there were no regional wall motion abnormalities. Features are consistent with a pseudonormal left ventricular filling pattern, with concomitant abnormal relaxation and increased filling pressure (grade 2 diastolic dysfunction). - Aortic valve: A bioprosthesis was present. There was trivial regurgitation. - Mitral valve: There was mild regurgitation. - Left atrium: The atrium was mildly dilated. - Pulmonary  arteries: Systolic pressure was mildly increased. PA peak pressure: 40 mm Hg (S).  ASSESSMENT AND PLAN: 1.  Aortic valve disease status post bioprosthetic aortic valve replacement: Postoperative echo reviewed with normal valve function and only trivial aortic insufficiency. The patient understands the need for SBE prophylaxis.  2. Coronary artery disease status post CABG: the patient is doing well without angina. She's had an excellent recovery from surgery. Advised that she resume aspirin  3. Postoperative atrial fibrillation: Amiodarone has been discontinued.no recurrent symptoms.  4. Essential hypertension:blood pressure well controlled on a combination of amlodipine, lisinopril, and metoprolol succinate.  5. Hyperlipidemia: The patient is on atorvastatin 10 mg. Fasting lipid panel done this morning.  6. Carotid stenosis: Patient follows with vascular surgery  7. CLL: Patient followed by hematology. Most recent lab work reviewed. Platelet count is close to 100,000. I think she should be on aspirin 81 mg daily.   Current medicines are reviewed with the patient today.  The patient does not have concerns regarding medicines.  The following changes have been made:  no change  Labs/ tests ordered today include:  No orders of the defined types were placed in this encounter.   Disposition:   FU 6 months  Signed, Nicole Mocha, MD  03/23/2014 5:25 PM    [facility] [address], [place], [place]  [address] Phone: [phone]; Fax: [phone]

## 2014-03-23 NOTE — Patient Instructions (Addendum)
Your physician has recommended you make the following change in your medication:  1. RESUME Aspirin 81mg  take one by mouth daily  Your physician discussed the importance of taking an antibiotic prior to any dental, gastrointestinal, genitourinary procedures to prevent damage to the heart valves from infection. You were given a prescription for an antibiotic based on current SBE prophylaxis guidelines.  Your physician wants you to follow-up in: 6 MONTHS with Dr Burt Knack.  You will receive a reminder letter in the mail two months in advance. If you don't receive a letter, please call our office to schedule the follow-up appointment.  Please come back to the office next week for FASTING lab work (LIPID and LIVER)

## 2014-03-27 ENCOUNTER — Other Ambulatory Visit (INDEPENDENT_AMBULATORY_CARE_PROVIDER_SITE_OTHER): Payer: Medicare Other | Admitting: *Deleted

## 2014-03-27 DIAGNOSIS — I2510 Atherosclerotic heart disease of native coronary artery without angina pectoris: Secondary | ICD-10-CM

## 2014-03-27 LAB — HEPATIC FUNCTION PANEL
ALT: 12 U/L (ref 0–35)
AST: 22 U/L (ref 0–37)
Albumin: 4 g/dL (ref 3.5–5.2)
Alkaline Phosphatase: 69 U/L (ref 39–117)
Bilirubin, Direct: 0.1 mg/dL (ref 0.0–0.3)
TOTAL PROTEIN: 6.5 g/dL (ref 6.0–8.3)
Total Bilirubin: 0.6 mg/dL (ref 0.2–1.2)

## 2014-03-27 LAB — LIPID PANEL
CHOL/HDL RATIO: 3
Cholesterol: 118 mg/dL (ref 0–200)
HDL: 42.3 mg/dL (ref >39.00)
LDL Cholesterol: 46 mg/dL (ref 0–99)
NONHDL: 75.7
Triglycerides: 150 mg/dL — ABNORMAL HIGH (ref 0.0–149.0)
VLDL: 30 mg/dL (ref 0.0–40.0)

## 2014-03-28 ENCOUNTER — Telehealth (HOSPITAL_COMMUNITY): Payer: Self-pay | Admitting: Cardiac Rehabilitation

## 2014-03-28 NOTE — Telephone Encounter (Signed)
pc to pt to discuss enrolling in cardiac rehab. Pt prefers to continue exercising on her own.

## 2014-03-29 ENCOUNTER — Other Ambulatory Visit: Payer: Medicare Other

## 2014-04-17 ENCOUNTER — Telehealth: Payer: Self-pay | Admitting: Cardiovascular Disease

## 2014-04-17 DIAGNOSIS — Z952 Presence of prosthetic heart valve: Secondary | ICD-10-CM

## 2014-04-17 MED ORDER — AMOXICILLIN 500 MG PO TABS: ORAL_TABLET | ORAL | Status: DC

## 2014-04-17 NOTE — Telephone Encounter (Signed)
New message      Pt accidentally threw her amoxicillian away.  She has a dentist appt this thurs.  Please call in more amoxicillian 500mg  to [facility].

## 2014-04-17 NOTE — Telephone Encounter (Signed)
Rx sent to pharmacy. Left message on pt's voicemail that Rx has been sent to the pharmacy.

## 2014-07-24 ENCOUNTER — Ambulatory Visit: Payer: Medicare Other | Admitting: Hematology and Oncology

## 2014-07-24 ENCOUNTER — Other Ambulatory Visit: Payer: Medicare Other

## 2014-07-25 ENCOUNTER — Encounter: Payer: Self-pay | Admitting: *Deleted

## 2014-08-22 ENCOUNTER — Encounter: Payer: Self-pay | Admitting: Family

## 2014-08-23 ENCOUNTER — Encounter: Payer: Self-pay | Admitting: Family

## 2014-08-23 ENCOUNTER — Ambulatory Visit (INDEPENDENT_AMBULATORY_CARE_PROVIDER_SITE_OTHER): Payer: Medicare Other | Admitting: Family

## 2014-08-23 ENCOUNTER — Ambulatory Visit (HOSPITAL_COMMUNITY)
Admission: RE | Admit: 2014-08-23 | Discharge: 2014-08-23 | Disposition: A | Discharge status: Home / Self Care | Payer: Medicare Other | Source: Ambulatory Visit | Attending: Family | Admitting: Family

## 2014-08-23 ENCOUNTER — Ambulatory Visit (INDEPENDENT_AMBULATORY_CARE_PROVIDER_SITE_OTHER)
Admission: RE | Admit: 2014-08-23 | Discharge: 2014-08-23 | Disposition: A | Discharge status: Home / Self Care | Payer: Medicare Other | Source: Ambulatory Visit | Attending: Family | Admitting: Family

## 2014-08-23 VITALS — BP 131/47 | HR 60 | Temp 97.0°F | Ht 60.0 in | Wt 98.9 lb

## 2014-08-23 DIAGNOSIS — Z9889 Other specified postprocedural states: Secondary | ICD-10-CM | POA: Diagnosis not present

## 2014-08-23 DIAGNOSIS — I70203 Unspecified atherosclerosis of native arteries of extremities, bilateral legs: Secondary | ICD-10-CM

## 2014-08-23 DIAGNOSIS — Z48812 Encounter for surgical aftercare following surgery on the circulatory system: Secondary | ICD-10-CM

## 2014-08-23 DIAGNOSIS — I6523 Occlusion and stenosis of bilateral carotid arteries: Secondary | ICD-10-CM

## 2014-08-23 NOTE — Patient Instructions (Signed)
Stroke Prevention Some medical conditions and behaviors are associated with an increased chance of having a stroke. You may prevent a stroke by making healthy choices and managing medical conditions. HOW CAN I REDUCE MY RISK OF HAVING A STROKE?   Stay physically active. Get at least 30 minutes of activity on most or all days.  Do not smoke. It may also be helpful to avoid exposure to secondhand smoke.  Limit alcohol use. Moderate alcohol use is considered to be:  No more than 2 drinks per day for men.  No more than 1 drink per day for nonpregnant women.  Eat healthy foods. This involves:  Eating 5 or more servings of fruits and vegetables a day.  Making dietary changes that address high blood pressure (hypertension), high cholesterol, diabetes, or obesity.  Manage your cholesterol levels.  Making food choices that are high in fiber and low in saturated fat, trans fat, and cholesterol may control cholesterol levels.  Take any prescribed medicines to control cholesterol as directed by your health care provider.  Manage your diabetes.  Controlling your carbohydrate and sugar intake is recommended to manage diabetes.  Take any prescribed medicines to control diabetes as directed by your health care provider.  Control your hypertension.  Making food choices that are low in salt (sodium), saturated fat, trans fat, and cholesterol is recommended to manage hypertension.  Take any prescribed medicines to control hypertension as directed by your health care provider.  Maintain a healthy weight.  Reducing calorie intake and making food choices that are low in sodium, saturated fat, trans fat, and cholesterol are recommended to manage weight.  Stop drug abuse.  Avoid taking birth control pills.  Talk to your health care provider about the risks of taking birth control pills if you are over 35 years old, smoke, get migraines, or have ever had a blood clot.  Get evaluated for sleep  disorders (sleep apnea).  Talk to your health care provider about getting a sleep evaluation if you snore a lot or have excessive sleepiness.  Take medicines only as directed by your health care provider.  For some people, aspirin or blood thinners (anticoagulants) are helpful in reducing the risk of forming abnormal blood clots that can lead to stroke. If you have the irregular heart rhythm of atrial fibrillation, you should be on a blood thinner unless there is a good reason you cannot take them.  Understand all your medicine instructions.  Make sure that other conditions (such as anemia or atherosclerosis) are addressed. SEEK IMMEDIATE MEDICAL CARE IF:   You have sudden weakness or numbness of the face, arm, or leg, especially on one side of the body.  Your face or eyelid droops to one side.  You have sudden confusion.  You have trouble speaking (aphasia) or understanding.  You have sudden trouble seeing in one or both eyes.  You have sudden trouble walking.  You have dizziness.  You have a loss of balance or coordination.  You have a sudden, severe headache with no known cause.  You have new chest pain or an irregular heartbeat. Any of these symptoms may represent a serious problem that is an emergency. Do not wait to see if the symptoms will go away. Get medical help at once. Call your local emergency services (911 in [place] Do not drive yourself to the hospital. Document Released: 01/31/2004 Document Revised: 05/09/2013 Document Reviewed: 06/25/2012 [employer] Patient Information 2015 [employer]. This information is not intended to replace advice given   to you by your health care provider. Make sure you discuss any questions you have with your health care provider.  

## 2014-08-23 NOTE — Progress Notes (Signed)
Established Carotid Patient   History of Present Illness  Nicole Mercado is a 74 y.o. female patient of Dr. Donnetta Hutching who presents today for followup of her left carotid endarterectomy in June of 2013.   Patient has no history of TIA or stroke. Specifically she denies a history of amaurosis fugax or monocular blindness, unilateral facial drooping, hemiplegia, or receptive or expressive aphasia.    Her left thigh claudication has resolved since the CABG.  Pt reports New Medical or Surgical History: CABG x 4 vessels January 2016 with aortic valve replacement.  Pt Diabetic: No Pt smoker: non-smoker  Pt meds include: Statin : No, states her cholesterol is checked by Urgent care which is her PCP ASA: Yes Other anticoagulants/antiplatelets: no    Past Medical History  Diagnosis Date  . Thrombocytopenia 11/19/2010  . Hypertension   . CLL (chronic lymphocytic leukemia)     slow leukemia---Dr  Murinson  . Arthritis   . Aortic stenosis     a. Echocardiogram (01/11/14):  Mild LVH, EF 60-65%, Gr 1 DD, possible bicuspid AV, severe AS (mean 61 mmHg, peak 104 mmHg), mild MVP of post leaflet, mild MR, PASP 33 mmHg.;  b. 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 Pancreatitis     h/o  . Carotid artery occlusion     a. s/p L CEA 2013;  b.  Carotid US (1/16):  Bilateral ICA 1-39%  . CAD (coronary artery disease)     a. LHC (01/13/14):  dLM 70 extending into oLAD, mLAD 40-50, pD1 80, pD2 70, ostial/prox OM1 70 >> CABG (L-LAD, S-OM1, S-D1, S-D2)    . Hx of cardiovascular stress test     a. Nuclear stress test That (4/13): Normal perfusion, EF 74%  . Atrial fibrillation     post op after CABG+AVR >> Amiodarone  . Hx of echocardiogram     Echo (2/16):  Mild LVH, EF 60-65%, no RWMA, Gr 2 DD, AVR ok (mean 9 mmHg), trivial AI, mild MR, mild LAE, PASP 40 mmHg  . H/O exercise stress test     NSSTT    Social History Social History  Substance Use Topics  . Smoking status: Never Smoker   . Smokeless tobacco:  Never Used  . Alcohol Use: No    Family History Family History  Problem Relation Age of Onset  . Heart disease Mother     in her 75s  . Hypertension Mother   . Heart attack Mother   . Heart disease Father   . Hypertension Father   . Hypertension Sister   . Hypertension Son   . Stroke Paternal Grandmother   . Stroke Paternal Grandfather     Surgical History Past Surgical History  Procedure Laterality Date  . Cesarean section      FIVE  . Cholecystectomy    . Appendectomy    . Abdominal hysterectomy    . Tonsillectomy    . Endarterectomy  06/09/2011    Procedure: ENDARTERECTOMY CAROTID;  Surgeon: Rosetta Posner, MD;  Location: [facility] OR;  Service: Vascular;  Laterality: Left;  left carotid endarterectomy with patch angioplasty  . Carotid endarterectomy  06/09/11    LEFT  cea  . Left and right heart catheterization with coronary angiogram [name] 01/13/2014    Procedure: LEFT AND RIGHT HEART CATHETERIZATION WITH CORONARY ANGIOGRAM;  Surgeon: Jettie Booze, MD;  Location: [facility] CATH LAB;  Service: Cardiovascular;  Laterality: [name];  . Coronary artery bypass graft [name] 01/17/2014    Procedure: CORONARY ARTERY  BYPASS GRAFTING (CABG)TIMES FOUR USING LEFT INTERNAL MAMMARY ARTERY AND RIGHT SAPHENOUS VEIN HARVESTED ENDOSCOPICALLY;  Surgeon: Gaye Pollack, MD;  Location: [place] OR;  Service: Open Heart Surgery;  Laterality: [name];  . Aortic valve replacement [name] 01/17/2014    Procedure: AORTIC VALVE REPLACEMENT (AVR);  Surgeon: Gaye Pollack, MD;  Location: [place];  Service: Open Heart Surgery;  Laterality: [name];  . Tee without cardioversion [name] 01/17/2014    Procedure: TRANSESOPHAGEAL ECHOCARDIOGRAM (TEE);  Surgeon: Gaye Pollack, MD;  Location: [place];  Service: Open Heart Surgery;  Laterality: [name];    No Known Allergies  Current Outpatient Prescriptions  Medication Sig Dispense Refill  . acetaminophen (TYLENOL) 500 MG tablet Take 1,000 mg by mouth every 6 (six) hours as needed. pain     . amLODipine  (NORVASC) 5 MG tablet Take 1 tablet (5 mg total) by mouth daily. 90 tablet 3  . aspirin EC 81 MG tablet Take 1 tablet (81 mg total) by mouth daily.    Marland Kitchen atorvastatin (LIPITOR) 10 MG tablet Take 1 tablet (10 mg total) by mouth daily at 6 PM. 90 tablet 3  . lisinopril (PRINIVIL,ZESTRIL) 20 MG tablet Take 1 tablet (20 mg total) by mouth daily. 90 tablet 3  . metoprolol succinate (TOPROL-XL) 50 MG 24 hr tablet Take 1 tablet (50 mg total) by mouth daily. Take with or immediately following a meal. 90 tablet 3  . amoxicillin (AMOXIL) 500 MG tablet Take 4 tablets by mouth one hour prior to dental procedure (Patient not taking: Reported on 08/23/2014) 8 tablet 2   No current facility-administered medications for this visit.    Review of Systems : See HPI for pertinent positives and negatives.  Physical Examination  Filed Vitals:   08/23/14 1118 08/23/14 1123  BP: 138/43 131/47  Pulse: 60   Temp: 97 F (36.1 C)   TempSrc: Oral   Height: 5' (1.524 m)   Weight: 98 lb 14.4 oz (44.861 kg)   SpO2: 99%    Body mass index is 19.32 kg/(m^2).  General: WDWN female in NAD GAIT: normal Eyes: PERRLA Pulmonary: Non-labored, CTAB, Negative Rales, Negative rhonchi, & Negative wheezing.  Cardiac: regular Rhythm, no detected murmur.  VASCULAR EXAM Carotid Bruits Right Left   negative negative  Aorta is not palpable  Radial pulses are 1+ palpable and equal.      LE Pulses Right Left   FEMORAL 2+ palpable 2+ palpable    POPLITEAL not palpable  not palpable   POSTERIOR TIBIAL not palpable  not palpable    DORSALIS PEDIS  ANTERIOR TIBIAL not palpable  not palpable     Gastrointestinal: soft, nontender, BS WNL, no r/g,no palpable masses.  Musculoskeletal: No muscle atrophy/wasting. M/S 4/5 throughout, Extremities  without ischemic changes. Feet appear well perfused.  Neurologic: A&O X 3; Appropriate Affect, Speech is normal CN 2-12 intact, Pain and light touch intact in extremities, Motor exam as listed above.          Non-Invasive Vascular Imaging CAROTID DUPLEX 08/23/2014   CEREBROVASCULAR DUPLEX EVALUATION    INDICATION: Follow-up carotid disease     PREVIOUS INTERVENTION(S): Left carotid endarterectomy 06/09/2011    DUPLEX EXAM:     RIGHT  LEFT  Peak Systolic Velocities (cm/s) End Diastolic Velocities (cm/s) Plaque LOCATION Peak Systolic Velocities (cm/s) End Diastolic Velocities (cm/s) Plaque  131 20  CCA PROXIMAL 108 13   66 14  CCA MID 76 12   73 17  CCA DISTAL 84 13  Occluded  HT ECA 137 0   112 30 HT ICA PROXIMAL 77 9 HM  121 28  ICA MID 92 24   121 30  ICA DISTAL 89 17     1.5 ICA / CCA Ratio (PSV) NA  Antegrade  Vertebral Flow Antegrade    Brachial Systolic Pressure (mmHg)   Within normal limits  Brachial Artery Waveforms Within normal limits     Plaque Morphology:  HM = Homogeneous, HT = Heterogeneous, CP = Calcific Plaque, SP = Smooth Plaque, IP = Irregular Plaque     ADDITIONAL FINDINGS:     IMPRESSION: 1. Evidence of <40% stenosis of the right internal carotid artery. 2. Right external carotid artery appears occluded. 3. Patent left carotid endarterectomy with minimal (<40%) hyperplasia at the distal patch. 4. Bilateral vertebral artery is antegrade.    Compared to the previous exam:  No significant change compared to prior exam.       Assessment: KAMALA KOLTON is a 74 y.o. female who is s/p left carotid endarterectomy in June of 2013.  She has no history of stroke or TIA. Today's carotid Duplex suggests <40% stenosis of the right internal carotid artery and a patent left carotid endarterectomy with minimal (<40%) hyperplasia at the distal patch.  No significant change compared to prior exam on 08/18/13.   Plan: Follow-up in 1 year with Carotid Duplex.    I discussed in depth with the patient the nature of atherosclerosis, and emphasized the importance of maximal medical management including strict control of blood pressure, blood glucose, and lipid levels, obtaining regular exercise, and continued cessation of smoking.  The patient is aware that without maximal medical management the underlying atherosclerotic disease process will progress, limiting the benefit of any interventions. The patient was given information about stroke prevention and what symptoms should prompt the patient to seek immediate medical care. Thank you for allowing [place] to participate in this patient's care.  Clemon Chambers, RN, MSN, FNP-C [facility] Office: [phone]  Clinic Physician: Scot Dock  08/23/2014 11:46 AM

## 2014-08-24 ENCOUNTER — Telehealth: Payer: Self-pay | Admitting: Hematology and Oncology

## 2014-08-24 ENCOUNTER — Ambulatory Visit (HOSPITAL_BASED_OUTPATIENT_CLINIC_OR_DEPARTMENT_OTHER): Payer: Medicare Other | Admitting: Hematology and Oncology

## 2014-08-24 ENCOUNTER — Encounter: Payer: Self-pay | Admitting: Hematology and Oncology

## 2014-08-24 ENCOUNTER — Other Ambulatory Visit (HOSPITAL_BASED_OUTPATIENT_CLINIC_OR_DEPARTMENT_OTHER): Payer: Medicare Other

## 2014-08-24 VITALS — BP 116/42 | HR 75 | Temp 97.6°F | Resp 16 | Ht 60.0 in | Wt 99.1 lb

## 2014-08-24 DIAGNOSIS — C9110 Chronic lymphocytic leukemia of B-cell type not having achieved remission: Secondary | ICD-10-CM

## 2014-08-24 DIAGNOSIS — D696 Thrombocytopenia, unspecified: Secondary | ICD-10-CM | POA: Diagnosis not present

## 2014-08-24 DIAGNOSIS — D630 Anemia in neoplastic disease: Secondary | ICD-10-CM | POA: Insufficient documentation

## 2014-08-24 LAB — CBC WITH DIFFERENTIAL/PLATELET
BASO%: 0.5 % (ref 0.0–2.0)
Basophils Absolute: 0 10*3/uL (ref 0.0–0.1)
EOS%: 1.5 % (ref 0.0–7.0)
Eosinophils Absolute: 0.1 10*3/uL (ref 0.0–0.5)
HEMATOCRIT: 34.6 % — AB (ref 34.8–46.6)
HGB: 11.5 g/dL — ABNORMAL LOW (ref 11.6–15.9)
LYMPH#: 5.8 10*3/uL — AB (ref 0.9–3.3)
LYMPH%: 67.3 % — ABNORMAL HIGH (ref 14.0–49.7)
MCH: 31.1 pg (ref 25.1–34.0)
MCHC: 33.1 g/dL (ref 31.5–36.0)
MCV: 93.9 fL (ref 79.5–101.0)
MONO#: 0.4 10*3/uL (ref 0.1–0.9)
MONO%: 4.6 % (ref 0.0–14.0)
NEUT%: 26.1 % — ABNORMAL LOW (ref 38.4–76.8)
NEUTROS ABS: 2.3 10*3/uL (ref 1.5–6.5)
PLATELETS: 36 10*3/uL — AB (ref 145–400)
RBC: 3.68 10*6/uL — ABNORMAL LOW (ref 3.70–5.45)
RDW: 14.9 % — ABNORMAL HIGH (ref 11.2–14.5)
WBC: 8.7 10*3/uL (ref 3.9–10.3)

## 2014-08-24 LAB — TECHNOLOGIST REVIEW

## 2014-08-24 NOTE — Telephone Encounter (Signed)
Gave and printed appt sched and avs for pt for Sept °

## 2014-08-24 NOTE — Assessment & Plan Note (Signed)
This is likely anemia of chronic disease, CLL or consumption/hemolysis. The patient denies recent history of bleeding such as epistaxis, hematuria or hematochezia. She is asymptomatic from the anemia. We will observe for now.  She does not require transfusion now.

## 2014-08-24 NOTE — Assessment & Plan Note (Signed)
The absolute lymphocyte count remained stable but she is side showing signs of anemia and thrombocytopenia. She denies bleeding. Peripheral valvular heart disease status post repair I am not sure whether the abnormal CBC could be due to consumption. She is already taking multivitamins so I do not suspect vitamin deficiencies I recommend she hold aspirin and recheck in 3 weeks. If her blood count remain abnormal, I might have to start restaging CT scan followed by treatment. The patient is adamant she does not want to be placed on chemotherapy. I recommend she reads patient education handout about the use of Ibrutinib for CLL

## 2014-08-24 NOTE — Assessment & Plan Note (Signed)
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  Consumption from valvular heart disease could also be a possibility It is much worse compared from previous platelet count. The patient denies recent history of bleeding such as epistaxis, hematuria or hematochezia. She is asymptomatic from the thrombocytopenia.   I recommend she hold aspirin due to excessive bruising. I will recheck her platelet count again in [redacted] weeks along with fibrinogen level. She does not need transfusion for now  If repeat his platelet count is still low, she would need repeat staging CT scan to assess disease burden and possibly treatment for CLL

## 2014-08-24 NOTE — Progress Notes (Signed)
[facility] OFFICE PROGRESS NOTE  Patient Care Team: Kristie Cowman, MD as PCP - General (Family Medicine) Kristie Cowman, MD (Family Medicine) Terance Ice, MD (Cardiology)  SUMMARY OF ONCOLOGIC HISTORY:   CLL (chronic lymphocytic leukemia)   03/21/2011 Initial Diagnosis CLL (chronic lymphocytic leukemia)   01/14/2014 - 01/31/2014 Hospital Admission The patient was hospitalized and was found to severe aortic stenosis causing syncopal episode. Nicole Mercado received treatment with steroids with minimum success and required platelet transfusion.    INTERVAL HISTORY: Please see below for problem oriented charting.  Nicole Mercado feels well. Denies recent infection. Denies new lymphadenopathy The patient denies any recent signs or symptoms of bleeding such as spontaneous epistaxis, hematuria or hematochezia.  Nicole Mercado bruises easily  REVIEW OF SYSTEMS:   Constitutional: Denies fevers, chills or abnormal weight loss Eyes: Denies blurriness of vision Ears, nose, mouth, throat, and face: Denies mucositis or sore throat Respiratory: Denies cough, dyspnea or wheezes Cardiovascular: Denies palpitation, chest discomfort or lower extremity swelling Gastrointestinal:  Denies nausea, heartburn or change in bowel habits Skin: Denies abnormal skin rashes Lymphatics: Denies new lymphadenopathy Neurological:Denies numbness, tingling or new weaknesses Behavioral/Psych: Mood is stable, no new changes  All other systems were reviewed with the patient and are negative.  I have reviewed the past medical history, past surgical history, social history and family history with the patient and they are unchanged from previous note.  ALLERGIES:  has No Known Allergies.  MEDICATIONS:  Current Outpatient Prescriptions  Medication Sig Dispense Refill  . acetaminophen (TYLENOL) 500 MG tablet Take 1,000 mg by mouth every 6 (six) hours as needed. pain     . amLODipine (NORVASC) 5 MG tablet Take 1 tablet (5 mg total) by  mouth daily. 90 tablet 3  . amoxicillin (AMOXIL) 500 MG tablet Take 4 tablets by mouth one hour prior to dental procedure 8 tablet 2  . aspirin EC 81 MG tablet Take 1 tablet (81 mg total) by mouth daily.    Marland Kitchen atorvastatin (LIPITOR) 10 MG tablet Take 1 tablet (10 mg total) by mouth daily at 6 PM. 90 tablet 3  . lisinopril (PRINIVIL,ZESTRIL) 20 MG tablet Take 1 tablet (20 mg total) by mouth daily. 90 tablet 3  . metoprolol succinate (TOPROL-XL) 50 MG 24 hr tablet Take 1 tablet (50 mg total) by mouth daily. Take with or immediately following a meal. 90 tablet 3   No current facility-administered medications for this visit.    PHYSICAL EXAMINATION: ECOG PERFORMANCE STATUS: 0 - Asymptomatic  Filed Vitals:   08/24/14 1044  BP: 116/42  Pulse: 75  Temp: 97.6 F (36.4 C)  Resp: 16   Filed Weights   08/24/14 1044  Weight: 99 lb 1.6 oz (44.951 kg)    GENERAL:alert, no distress and comfortable SKIN: skin color, texture, turgor are normal, no rashes or significant lesions. Noticed some bruising but no petechiae EYES: normal, Conjunctiva are pink and non-injected, sclera clear OROPHARYNX:no exudate, no erythema and lips, buccal mucosa, and tongue normal  NECK: supple, thyroid normal size, non-tender, without nodularity LYMPH:  no palpable lymphadenopathy in the cervical, axillary or inguinal LUNGS: clear to auscultation and percussion with normal breathing effort HEART: regular rate & rhythm with soft murmur on the left sternal border and no lower extremity edema ABDOMEN:abdomen soft, non-tender and normal bowel sounds Musculoskeletal:no cyanosis of digits and no clubbing  NEURO: alert & oriented x 3 with fluent speech, no focal motor/sensory deficits  LABORATORY DATA:  I have reviewed the data as listed  Component Value Date/Time   NA 139 02/23/2014 1032   NA 135 01/21/2014 0420   K 4.1 02/23/2014 1032   K 3.7 01/21/2014 0420   CL 95* 01/21/2014 0420   CL 105 05/20/2012 0823    CO2 24 02/23/2014 1032   CO2 29 01/21/2014 0420   GLUCOSE 139 02/23/2014 1032   GLUCOSE 129* 01/21/2014 0420   GLUCOSE 70 05/20/2012 0823   BUN 19.2 02/23/2014 1032   BUN 18 01/21/2014 0420   CREATININE 1.4* 02/23/2014 1032   CREATININE 1.05 01/21/2014 0420   CALCIUM 10.3 02/23/2014 1032   CALCIUM 8.7 01/21/2014 0420   PROT 6.5 03/27/2014 0822   PROT 6.6 02/23/2014 1032   ALBUMIN 4.0 03/27/2014 0822   ALBUMIN 4.0 02/23/2014 1032   AST 22 03/27/2014 0822   AST 20 02/23/2014 1032   ALT 12 03/27/2014 0822   ALT 11 02/23/2014 1032   ALKPHOS 69 03/27/2014 0822   ALKPHOS 87 02/23/2014 1032   BILITOT 0.6 03/27/2014 0822   BILITOT 0.72 02/23/2014 1032   GFRNONAA 51* 01/21/2014 0420   GFRAA 60* 01/21/2014 0420    No results found for: SPEP, UPEP  Lab Results  Component Value Date   WBC 8.7 08/24/2014   NEUTROABS 2.3 08/24/2014   HGB 11.5* 08/24/2014   HCT 34.6* 08/24/2014   MCV 93.9 08/24/2014   PLT 36* 08/24/2014      Chemistry      Component Value Date/Time   NA 139 02/23/2014 1032   NA 135 01/21/2014 0420   K 4.1 02/23/2014 1032   K 3.7 01/21/2014 0420   CL 95* 01/21/2014 0420   CL 105 05/20/2012 0823   CO2 24 02/23/2014 1032   CO2 29 01/21/2014 0420   BUN 19.2 02/23/2014 1032   BUN 18 01/21/2014 0420   CREATININE 1.4* 02/23/2014 1032   CREATININE 1.05 01/21/2014 0420      Component Value Date/Time   CALCIUM 10.3 02/23/2014 1032   CALCIUM 8.7 01/21/2014 0420   ALKPHOS 69 03/27/2014 0822   ALKPHOS 87 02/23/2014 1032   AST 22 03/27/2014 0822   AST 20 02/23/2014 1032   ALT 12 03/27/2014 0822   ALT 11 02/23/2014 1032   BILITOT 0.6 03/27/2014 0822   BILITOT 0.72 02/23/2014 1032      ASSESSMENT & PLAN:  CLL (chronic lymphocytic leukemia) The absolute lymphocyte count remained stable but Nicole Mercado is side showing signs of anemia and thrombocytopenia. Nicole Mercado denies bleeding. 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 Nicole Mercado is already taking multivitamins so I do not suspect vitamin deficiencies I recommend Nicole Mercado hold aspirin and recheck in 3 weeks. If her blood count remain abnormal, I might have to start restaging CT scan followed by treatment. The patient is adamant Nicole Mercado does not want to be placed on chemotherapy. 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  Consumption from valvular heart disease could also be a possibility It is much worse compared from previous platelet count. The patient denies recent history of bleeding such as epistaxis, hematuria or hematochezia. Nicole Mercado is asymptomatic from the thrombocytopenia.   I recommend Nicole Mercado hold aspirin due to excessive bruising. I will recheck her platelet count again in [redacted] weeks along with fibrinogen level. Nicole Mercado does not need transfusion for now  If repeat his platelet count is still low, Nicole Mercado  would need repeat staging CT scan to assess disease burden and possibly treatment for CLL  Anemia in neoplastic disease This is likely anemia of chronic disease, CLL or consumption/hemolysis. The patient denies recent history of bleeding such as epistaxis, hematuria or hematochezia. Nicole Mercado is asymptomatic from the anemia. We will observe for now.  Nicole Mercado does not require transfusion now.     Orders Placed This Encounter  Procedures  . CBC & Diff and Retic    Standing Status: Future     Number of Occurrences:      Standing Expiration Date: 09/28/2015  . Comprehensive metabolic panel    Standing Status: Future     Number of Occurrences:      Standing Expiration Date: 09/28/2015  . Fibrinogen    Standing Status: Future     Number of Occurrences:      Standing Expiration Date: 09/28/2015  . Lactate dehydrogenase    Standing Status: Future     Number of Occurrences:      Standing Expiration Date: 09/28/2015  . Direct antiglobulin test (not at  [facility])    Standing Status: Future     Number of Occurrences:      Standing Expiration Date: 09/28/2015   All questions were answered. The patient knows to call the clinic with any problems, questions or concerns. No barriers to learning was detected. I spent 20 minutes counseling the patient face to face. The total time spent in the appointment was 30 minutes and more than 50% was on counseling and review of test results     [facility], Saysha Menta, MD 08/24/2014 1:11 PM

## 2014-08-29 ENCOUNTER — Other Ambulatory Visit (HOSPITAL_COMMUNITY): Payer: Medicare Other

## 2014-08-29 ENCOUNTER — Encounter (HOSPITAL_COMMUNITY): Payer: Medicare Other

## 2014-08-29 ENCOUNTER — Ambulatory Visit: Payer: Medicare Other | Admitting: Family

## 2014-09-13 ENCOUNTER — Ambulatory Visit (HOSPITAL_BASED_OUTPATIENT_CLINIC_OR_DEPARTMENT_OTHER): Payer: Medicare Other | Admitting: Hematology and Oncology

## 2014-09-13 ENCOUNTER — Telehealth: Payer: Self-pay | Admitting: Hematology and Oncology

## 2014-09-13 ENCOUNTER — Ambulatory Visit (HOSPITAL_BASED_OUTPATIENT_CLINIC_OR_DEPARTMENT_OTHER): Payer: Medicare Other

## 2014-09-13 ENCOUNTER — Encounter: Payer: Self-pay | Admitting: Hematology and Oncology

## 2014-09-13 ENCOUNTER — Other Ambulatory Visit: Payer: Self-pay | Admitting: Hematology and Oncology

## 2014-09-13 ENCOUNTER — Other Ambulatory Visit: Payer: Self-pay | Admitting: *Deleted

## 2014-09-13 VITALS — BP 155/43 | HR 59 | Temp 97.7°F | Resp 18 | Ht 60.0 in | Wt 98.8 lb

## 2014-09-13 DIAGNOSIS — C9110 Chronic lymphocytic leukemia of B-cell type not having achieved remission: Secondary | ICD-10-CM

## 2014-09-13 DIAGNOSIS — D696 Thrombocytopenia, unspecified: Secondary | ICD-10-CM | POA: Diagnosis not present

## 2014-09-13 DIAGNOSIS — Z853 Personal history of malignant neoplasm of breast: Secondary | ICD-10-CM

## 2014-09-13 DIAGNOSIS — D630 Anemia in neoplastic disease: Secondary | ICD-10-CM

## 2014-09-13 DIAGNOSIS — Z23 Encounter for immunization: Secondary | ICD-10-CM

## 2014-09-13 LAB — CBC & DIFF AND RETIC
BASO%: 0.2 % (ref 0.0–2.0)
Basophils Absolute: 0 10*3/uL (ref 0.0–0.1)
EOS%: 1.5 % (ref 0.0–7.0)
Eosinophils Absolute: 0.1 10*3/uL (ref 0.0–0.5)
HCT: 35.1 % (ref 34.8–46.6)
HGB: 11.7 g/dL (ref 11.6–15.9)
Immature Retic Fract: 9.7 % (ref 1.60–10.00)
LYMPH%: 71.4 % — AB (ref 14.0–49.7)
MCH: 31.5 pg (ref 25.1–34.0)
MCHC: 33.3 g/dL (ref 31.5–36.0)
MCV: 94.4 fL (ref 79.5–101.0)
MONO#: 0.7 10*3/uL (ref 0.1–0.9)
MONO%: 8.5 % (ref 0.0–14.0)
NEUT#: 1.5 10*3/uL (ref 1.5–6.5)
NEUT%: 18.4 % — AB (ref 38.4–76.8)
PLATELETS: 44 10*3/uL — AB (ref 145–400)
RBC: 3.72 10*6/uL (ref 3.70–5.45)
RDW: 14.2 % (ref 11.2–14.5)
Retic %: 2.25 % — ABNORMAL HIGH (ref 0.70–2.10)
Retic Ct Abs: 83.7 10*3/uL (ref 33.70–90.70)
WBC: 8.2 10*3/uL (ref 3.9–10.3)
lymph#: 5.9 10*3/uL — ABNORMAL HIGH (ref 0.9–3.3)

## 2014-09-13 LAB — COMPREHENSIVE METABOLIC PANEL (CC13)
ALBUMIN: 3.7 g/dL (ref 3.5–5.0)
ALT: 17 U/L (ref 0–55)
ANION GAP: 8 meq/L (ref 3–11)
AST: 23 U/L (ref 5–34)
Alkaline Phosphatase: 74 U/L (ref 40–150)
BILIRUBIN TOTAL: 0.61 mg/dL (ref 0.20–1.20)
BUN: 22.1 mg/dL (ref 7.0–26.0)
CO2: 28 mEq/L (ref 22–29)
Calcium: 10 mg/dL (ref 8.4–10.4)
Chloride: 106 mEq/L (ref 98–109)
Creatinine: 1.2 mg/dL — ABNORMAL HIGH (ref 0.6–1.1)
EGFR: 46 mL/min/{1.73_m2} — AB (ref >90)
GLUCOSE: 84 mg/dL (ref 70–140)
POTASSIUM: 4.2 meq/L (ref 3.5–5.1)
SODIUM: 142 meq/L (ref 136–145)
TOTAL PROTEIN: 5.9 g/dL — AB (ref 6.4–8.3)

## 2014-09-13 LAB — LACTATE DEHYDROGENASE (CC13): LDH: 240 U/L (ref 125–245)

## 2014-09-13 LAB — TECHNOLOGIST REVIEW

## 2014-09-13 MED ORDER — IBRUTINIB 140 MG PO CAPS: 420.0000 mg | ORAL_CAPSULE | Freq: Every day | ORAL | Status: DC

## 2014-09-13 MED ORDER — INFLUENZA VAC SPLIT QUAD 0.5 ML IM SUSY
0.5000 mL | PREFILLED_SYRINGE | Freq: Once | INTRAMUSCULAR | Status: AC
  Administered 2014-09-13: 0.5 mL via INTRAMUSCULAR
  Filled 2014-09-13: qty 0.5

## 2014-09-13 NOTE — Telephone Encounter (Signed)
Gave adn printed appt sched and avs fo rpt fo rSept...gv barium

## 2014-09-13 NOTE — Progress Notes (Signed)
[facility] OFFICE PROGRESS NOTE  Patient Care Team: Kristie Cowman, MD as PCP - General (Family Medicine) Kristie Cowman, MD (Family Medicine) Terance Ice, MD (Cardiology)  SUMMARY OF ONCOLOGIC HISTORY:   CLL (chronic lymphocytic leukemia)   03/21/2011 Initial Diagnosis CLL (chronic lymphocytic leukemia)   01/14/2014 - 01/31/2014 Hospital Admission The patient was hospitalized and was found to severe aortic stenosis causing syncopal episode. She received treatment with steroids with minimum success and required platelet transfusion.    INTERVAL HISTORY: Please see below for problem oriented charting. She returns for further follow-up. She continues to have excessive bruising. The patient denies any recent signs or symptoms of bleeding such as spontaneous epistaxis, hematuria or hematochezia.   REVIEW OF SYSTEMS:   Constitutional: Denies fevers, chills or abnormal weight loss Eyes: Denies blurriness of vision Ears, nose, mouth, throat, and face: Denies mucositis or sore throat Respiratory: Denies cough, dyspnea or wheezes Cardiovascular: Denies palpitation, chest discomfort or lower extremity swelling Gastrointestinal:  Denies nausea, heartburn or change in bowel habits Skin: Denies abnormal skin rashes Lymphatics: Denies new lymphadenopathy  Neurological:Denies numbness, tingling or new weaknesses Behavioral/Psych: Mood is stable, no new changes  All other systems were reviewed with the patient and are negative.  I have reviewed the past medical history, past surgical history, social history and family history with the patient and they are unchanged from previous note.  ALLERGIES:  has No Known Allergies.  MEDICATIONS:  Current Outpatient Prescriptions  Medication Sig Dispense Refill  . acetaminophen (TYLENOL) 500 MG tablet Take 1,000 mg by mouth every 6 (six) hours as needed. pain     . amLODipine (NORVASC) 5 MG tablet Take 1 tablet (5 mg total) by mouth  daily. 90 tablet 3  . amoxicillin (AMOXIL) 500 MG tablet Take 4 tablets by mouth one hour prior to dental procedure 8 tablet 2  . aspirin EC 81 MG tablet Take 1 tablet (81 mg total) by mouth daily.    Marland Kitchen atorvastatin (LIPITOR) 10 MG tablet Take 1 tablet (10 mg total) by mouth daily at 6 PM. 90 tablet 3  . lisinopril (PRINIVIL,ZESTRIL) 20 MG tablet Take 1 tablet (20 mg total) by mouth daily. 90 tablet 3  . metoprolol succinate (TOPROL-XL) 50 MG 24 hr tablet Take 1 tablet (50 mg total) by mouth daily. Take with or immediately following a meal. 90 tablet 3  . ibrutinib (IMBRUVICA) 140 MG capsul Take 3 capsules (420 mg total) by mouth daily. 90 capsule 6   No current facility-administered medications for this visit.    PHYSICAL EXAMINATION: ECOG PERFORMANCE STATUS: 1 - Symptomatic but completely ambulatory  Filed Vitals:   09/13/14 0903  BP: 155/43  Pulse: 59  Temp: 97.7 F (36.5 C)  Resp: 18   Filed Weights   09/13/14 0903  Weight: 98 lb 12.8 oz (44.815 kg)    GENERAL:alert, no distress and comfortable SKIN: skin color, texture, turgor are normal, no rashes or significant lesions. Noted excessive skin bruising but no petechiae EYES: normal, Conjunctiva are pink and non-injected, sclera clear Musculoskeletal:no cyanosis of digits and no clubbing  NEURO: alert & oriented x 3 with fluent speech, no focal motor/sensory deficits  LABORATORY DATA:  I have reviewed the data as listed    Component Value Date/Time   NA 142 09/13/2014 0848   NA 135 01/21/2014 0420   K 4.2 09/13/2014 0848   K 3.7 01/21/2014 0420   CL 95* 01/21/2014 0420   CL 105 05/20/2012 1275  CO2 28 09/13/2014 0848   CO2 29 01/21/2014 0420   GLUCOSE 84 09/13/2014 0848   GLUCOSE 129* 01/21/2014 0420   GLUCOSE 70 05/20/2012 0823   BUN 22.1 09/13/2014 0848   BUN 18 01/21/2014 0420   CREATININE 1.2* 09/13/2014 0848   CREATININE 1.05 01/21/2014 0420   CALCIUM 10.0 09/13/2014 0848   CALCIUM 8.7 01/21/2014 0420    PROT 5.9* 09/13/2014 0848   PROT 6.5 03/27/2014 0822   ALBUMIN 3.7 09/13/2014 0848   ALBUMIN 4.0 03/27/2014 0822   AST 23 09/13/2014 0848   AST 22 03/27/2014 0822   ALT 17 09/13/2014 0848   ALT 12 03/27/2014 0822   ALKPHOS 74 09/13/2014 0848   ALKPHOS 69 03/27/2014 0822   BILITOT 0.61 09/13/2014 0848   BILITOT 0.6 03/27/2014 0822   GFRNONAA 51* 01/21/2014 0420   GFRAA 60* 01/21/2014 0420    No results found for: SPEP, UPEP  Lab Results  Component Value Date   WBC 8.2 09/13/2014   NEUTROABS 1.5 09/13/2014   HGB 11.7 09/13/2014   HCT 35.1 09/13/2014   MCV 94.4 09/13/2014   PLT 44* 09/13/2014      Chemistry      Component Value Date/Time   NA 142 09/13/2014 0848   NA 135 01/21/2014 0420   K 4.2 09/13/2014 0848   K 3.7 01/21/2014 0420   CL 95* 01/21/2014 0420   CL 105 05/20/2012 0823   CO2 28 09/13/2014 0848   CO2 29 01/21/2014 0420   BUN 22.1 09/13/2014 0848   BUN 18 01/21/2014 0420   CREATININE 1.2* 09/13/2014 0848   CREATININE 1.05 01/21/2014 0420      Component Value Date/Time   CALCIUM 10.0 09/13/2014 0848   CALCIUM 8.7 01/21/2014 0420   ALKPHOS 74 09/13/2014 0848   ALKPHOS 69 03/27/2014 0822   AST 23 09/13/2014 0848   AST 22 03/27/2014 0822   ALT 17 09/13/2014 0848   ALT 12 03/27/2014 0822   BILITOT 0.61 09/13/2014 0848   BILITOT 0.6 03/27/2014 0822     ASSESSMENT & PLAN:  CLL (chronic lymphocytic leukemia) I have a long discussion with the patient and her significant other. 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 I recommend CT scan imaging study. We discussed various treatment options including treatment for CLL or treatment for ITP. The patient elected for treatment for CLL. I recommend staging CT scan and see her back next week for further discussion. I discussed with her the risks, benefit, side effects of Ibrutinib and she agreed to proceed  Thrombocytopenia The cause is likely autoimmune,  related to CLL.  Consumption from valvular heart disease could also be a possibility The patient denies recent history of bleeding such as epistaxis, hematuria or hematochezia. She is asymptomatic from the thrombocytopenia.   I recommend she hold aspirin due to excessive bruising. She does not need transfusion for now I have ordered staging CT scan to assess disease burden and possibly treatment for CLL     Orders Placed This Encounter  Procedures  . CT Chest W Contrast    Standing Status: Future     Number of Occurrences:      Standing Expiration Date: 11/13/2015    Order Specific Question:  Reason for Exam (SYMPTOM  OR DIAGNOSIS REQUIRED)    Answer:  staging CLL, thrombocytopenia    Order Specific Question:  Preferred imaging location?    Answer:  [facility]  . CT Abdomen Pelvis W  Contrast    Standing Status: Future     Number of Occurrences:      Standing Expiration Date: 12/14/2015    Order Specific Question:  Reason for Exam (SYMPTOM  OR DIAGNOSIS REQUIRED)    Answer:  staging CLL, thrombocytopenia    Order Specific Question:  Preferred imaging location?    Answer:  [facility]   All questions were answered. The patient knows to call the clinic with any problems, questions or concerns. No barriers to learning was detected. I spent 25 minutes counseling the patient face to face. The total time spent in the appointment was 30 minutes and more than 50% was on counseling and review of test results     [facility], [place], MD 09/13/2014 1:09 PM

## 2014-09-13 NOTE — Patient Instructions (Signed)
Ibrutinib capsules  What is this medicine?  IBRUTINIB (eye BROO ti nib) is a chemotherapy drug. It targets proteins in cancer cells and stops the cancer cells from growing. This medicine is used to treat mantle cell lymphoma, chronic lymphocytic leukemia, and Waldenstrom macroglobulinemia.  This medicine may be used for other purposes; ask your health care provider or pharmacist if you have questions.  COMMON BRAND NAME(S): IMBRUVICA  What should I tell my health care provider before I take this medicine?  They need to know if you have any of these conditions:  -bleeding disorders  -heart disease  -history of irregular heartbeat  -infection  -liver disease  -recent surgery  -smoke tobacco  -take medicines that treat or prevent blood clots  -an unusual or allergic reaction to ibrutinib, other medicines, foods, dyes, or preservatives  -pregnant or trying to get pregnant  -breast-feeding  How should I use this medicine?  Take this medicine by mouth with a glass of water. Follow the directions on the prescription label. Do not cut, crush or chew this medicine. Take your medicine at regular intervals. Do not take it more often than directed. Do not stop taking except on your doctor's advice.  Talk to your pediatrician regarding the use of this medicine in children. Special care may be needed.  Overdosage: If you think you've taken too much of this medicine contact a poison control center or emergency room at once.  Overdosage: If you think you have taken too much of this medicine contact a poison control center or emergency room at once.  NOTE: This medicine is only for you. Do not share this medicine with others.  What if I miss a dose?  If you miss a dose, take it as soon as you can. If it is almost time for your next dose, take only that dose. Do not take double or extra doses.  What may interact with this medicine?  Do not take this medicine with any of the following  medications:  -boceprevir  -bosentan  -carbamazepine  -certain medicines for fungal infections like ketoconazole, itraconazole, posaconazole, and voriconazole  -chloramphenicol  -clarithromycin  -conivaptan  -delavirdine  -efavirenz  -enzalutamide  -grapefruit juice  -indinavir  -isoniazid  -lanreotide or octreotide  -nefazodone  -nelfinavir  -nevirapine  -nicardipine  -phenobarbital  -phenytoin  -rifampin  -ritonavir  -saquinavir  -[place] oranges  -st. john's wort  -telaprevir  -telithromycin  -tipranavir  This medicine may also interact with the following medications:  -amiodarone  -amitriptyline  -amprenavir or fosamprenavir  -aprepitant or fosaprepitant  -atazanavir  -bromocriptine  -ciprofloxacin  -crizotinib  -danazol  -darunavir  -dasatinib  -digoxin  -diltiazem  -erythromycin  -fluconazole  -fluvoxamine  -imatinib  -lapatinib  -mifepristone, RU-486  -quinine  -verapamil  -zafirlukast  This list may not describe all possible interactions. Give your health care provider a list of all the medicines, herbs, non-prescription drugs, or dietary supplements you use. Also tell them if you smoke, drink alcohol, or use illegal drugs. Some items may interact with your medicine.  What should I watch for while using this medicine?  This drug may make you feel generally unwell. This is not uncommon, as chemotherapy can affect healthy cells as well as cancer cells. Report any side effects. Continue your course of treatment even though you feel ill unless your doctor tells you to stop.  Do not become pregnant while taking this medicine. Women should inform their doctor if they wish to become pregnant or think they   might be pregnant. There is a potential for serious side effects to an unborn child. Talk to your health care professional or pharmacist for more information. Do not breast-feed an infant while taking this medicine.  This medicine may increase your risk to bruise or bleed. Call your doctor or health care  professional if you notice any unusual bleeding.  If you are going to have surgery or any other procedures, tell your doctor you are taking this medicine.  Call your doctor or health care professional for advice if you get a fever, chills or sore throat, or other symptoms of a cold or flu. Do not treat yourself. This drug decreases your body's ability to fight infections. Try to avoid being around people who are sick.  You may need blood work done while you are taking this medicine.  Talk to your doctor about your risk of cancer. You may be more at risk for certain types of cancers if you take this medicine.  What side effects may I notice from receiving this medicine?  Side effects that you should report to your doctor or health care professional as soon as possible:  -  allergic reactions like skin rash, itching or hives, swelling of the face, lips, or tongue  -confusion  -low blood counts - this medicine may decrease the number of white blood cells, red blood cells and platelets. You may be at increased risk for infections and bleeding  -signs or symptoms of bleeding such as bloody or black, tarry stools; red or dark-brown urine; spitting up blood or brown material that looks like coffee grounds; red spots on the skin; unusual bruising or bleeding from the eye, gums, or nose  -signs and symptoms of a dangerous change in heartbeat or heart rhythm like chest pain; dizziness; fast or irregular heartbeat; palpitations; feeling faint or lightheaded, falls; breathing problems  -signs and symptoms of infection like fever or chills; cough; sore throat; or pain when urinating  -signs and symptoms of kidney injury like trouble passing urine or change in the amount of urine  -unusually weak or tired  Side effects that usually do not require medical attention (report to your doctor or health care professional if they continue or are bothersome):  -bone pain  -diarrhea  -joint pain  -muscle pain  -nausea  This list may not  describe all possible side effects. Call your doctor for medical advice about side effects. You may report side effects to FDA at 1-800-FDA-1088.  Where should I keep my medicine?  Keep out of the reach of children.  Store between 20 and 25 degrees C (68 and 77 degrees F). Keep this medicine in the original container. Throw away any unused medicine after the expiration date.  NOTE: This sheet is a summary. It may not cover all possible information. If you have questions about this medicine, talk to your doctor, pharmacist, or health care provider.   2015, [employer]. (2013-02-07 20:55:55)

## 2014-09-13 NOTE — Telephone Encounter (Signed)
New Rx for Ibrutinib faxed to [facility].

## 2014-09-13 NOTE — Assessment & Plan Note (Signed)
I have a long discussion with the patient and her significant other. 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 I recommend CT scan imaging study. We discussed various treatment options including treatment for CLL or treatment for ITP. The patient elected for treatment for CLL. I recommend staging CT scan and see her back next week for further discussion. I discussed with her the risks, benefit, side effects of Ibrutinib and she agreed to proceed

## 2014-09-13 NOTE — Assessment & Plan Note (Signed)
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  Consumption from valvular heart disease could also be a possibility The patient denies recent history of bleeding such as epistaxis, hematuria or hematochezia. She is asymptomatic from the thrombocytopenia.   I recommend she hold aspirin due to excessive bruising. She does not need transfusion for now I have ordered staging CT scan to assess disease burden and possibly treatment for CLL

## 2014-09-14 ENCOUNTER — Encounter: Payer: Self-pay | Admitting: Hematology and Oncology

## 2014-09-14 ENCOUNTER — Telehealth: Payer: Self-pay | Admitting: *Deleted

## 2014-09-14 NOTE — Telephone Encounter (Signed)
Opened in error

## 2014-09-14 NOTE — Progress Notes (Signed)
I faxed [employer] req for imbruvica.

## 2014-09-14 NOTE — Progress Notes (Signed)
Per [employer] imbruvica approved 09/14/14-09/14/15 under medicare part d.   FK#[id number]. I sent to medical records

## 2014-09-15 LAB — FIBRINOGEN: Fibrinogen: 174 mg/dL — ABNORMAL LOW (ref 204–475)

## 2014-09-15 LAB — DIRECT ANTIGLOBULIN RFX ANTI-C3/IGG: DAT, Polyspecific: NEGATIVE

## 2014-09-18 ENCOUNTER — Other Ambulatory Visit: Payer: Self-pay

## 2014-09-18 ENCOUNTER — Ambulatory Visit (HOSPITAL_COMMUNITY)
Admission: RE | Admit: 2014-09-18 | Discharge: 2014-09-18 | Disposition: A | Discharge status: Home / Self Care | Payer: Medicare Other | Source: Ambulatory Visit | Attending: Hematology and Oncology | Admitting: Hematology and Oncology

## 2014-09-18 ENCOUNTER — Encounter (HOSPITAL_COMMUNITY): Payer: Self-pay

## 2014-09-18 DIAGNOSIS — C9110 Chronic lymphocytic leukemia of B-cell type not having achieved remission: Secondary | ICD-10-CM | POA: Insufficient documentation

## 2014-09-18 DIAGNOSIS — I701 Atherosclerosis of renal artery: Secondary | ICD-10-CM | POA: Insufficient documentation

## 2014-09-18 DIAGNOSIS — I359 Nonrheumatic aortic valve disorder, unspecified: Secondary | ICD-10-CM

## 2014-09-18 DIAGNOSIS — N200 Calculus of kidney: Secondary | ICD-10-CM | POA: Diagnosis not present

## 2014-09-18 IMAGING — CT CT CHEST W/ CM
2 of 5 series · 16 of 46 positions shown, 18 images · IV contrast (OMNIPAQUE)
Comparison: Plain film [DATE].  No prior CTs.

CLINICAL DATA: Thrombocytopenia. Staging of chronic lymphocytic
leukemia. No treatment. Cough.

EXAM:
CT CHEST, ABDOMEN, AND PELVIS WITH CONTRAST
TECHNIQUE: Multidetector CT imaging of the chest, abdomen and pelvis was
performed following the standard protocol during bolus
administration of intravenous contrast.
CONTRAST:  100mL OMNIPAQUE IOHEXOL 300 MG/ML  SOLN

[Series 2: cap with st · axial · 0.75mm/px · z∈[+812,+1352]mm · 13 of 122 slices shown, 15 images]
[im 7/122  soft-tissue]
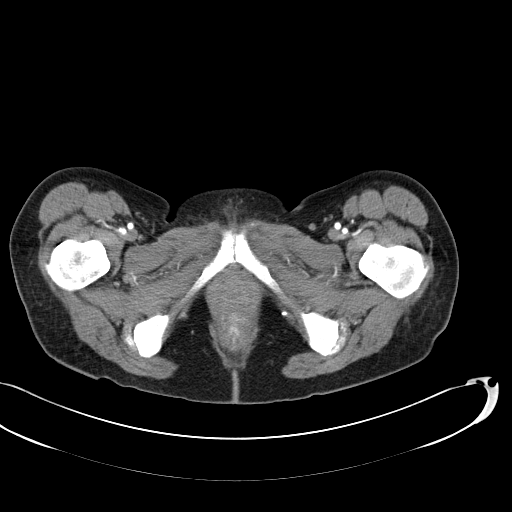
[im 7/122  bone]
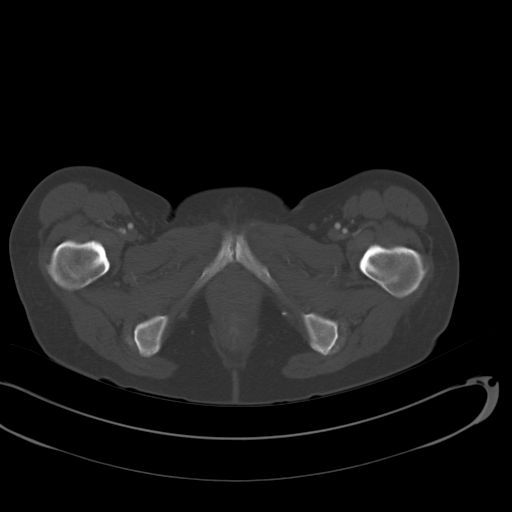
[im 14/122  soft-tissue]
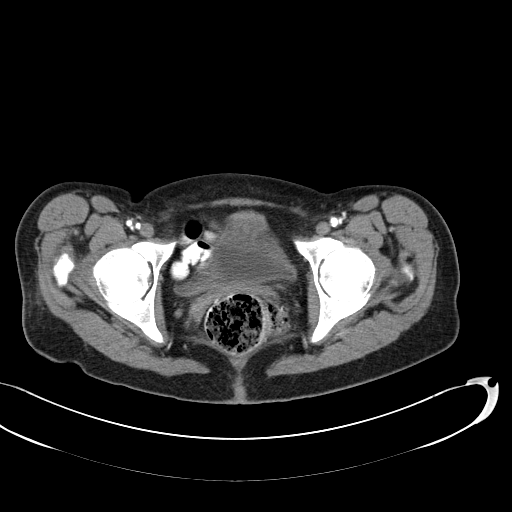
[im 27/122  soft-tissue]
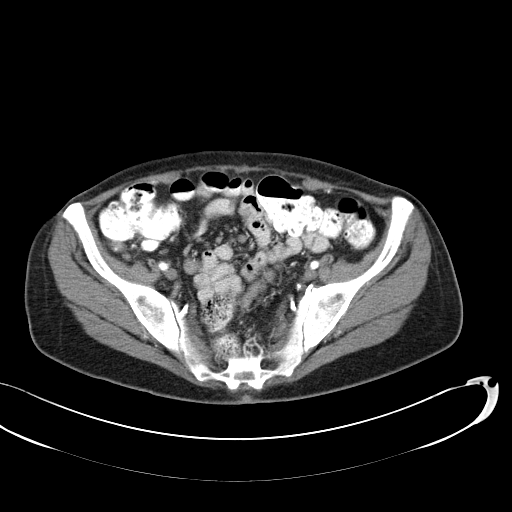
[im 34/122  soft-tissue]
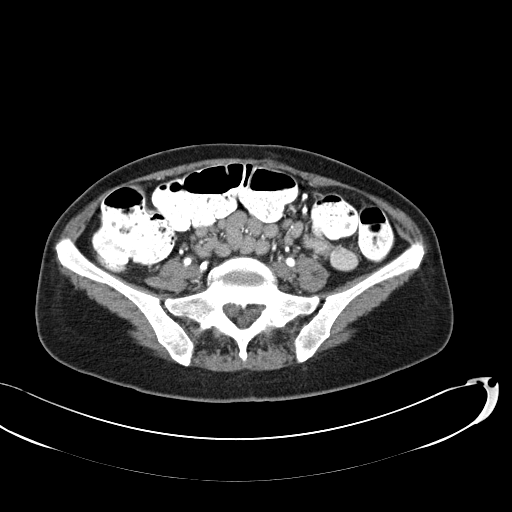
[im 41/122  soft-tissue]
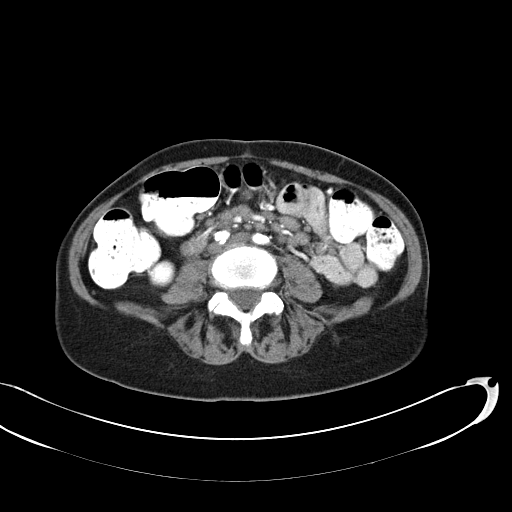
[im 54/122  soft-tissue]
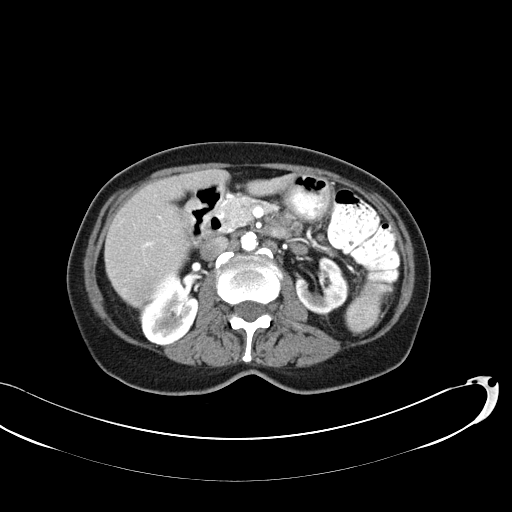
[im 61/122  soft-tissue]
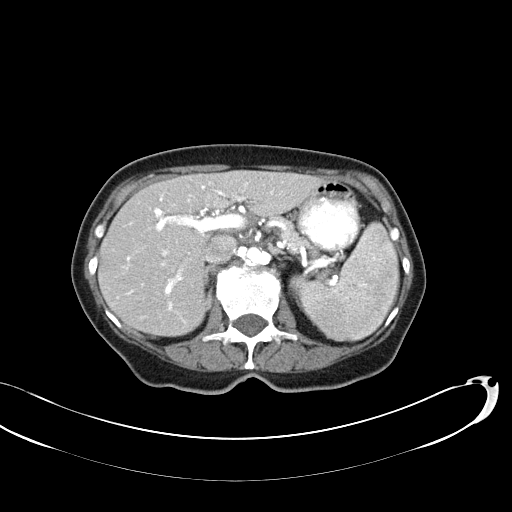
[im 68/122  soft-tissue]
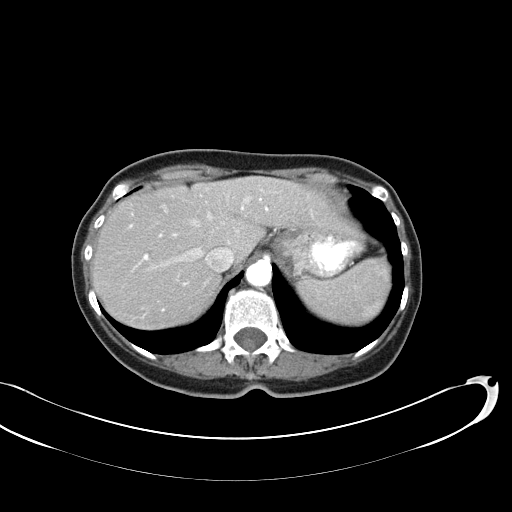
[im 81/122  soft-tissue]
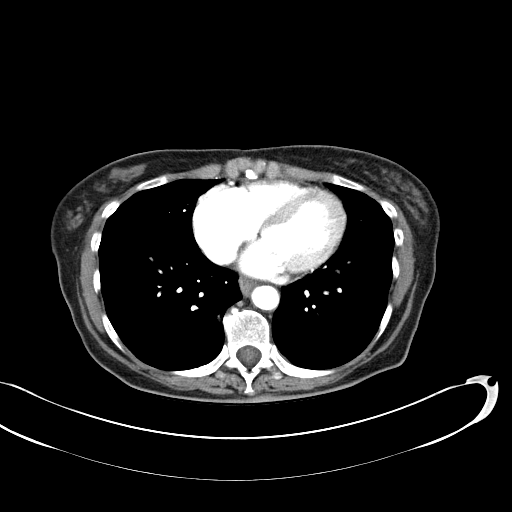
[im 81/122  bone]
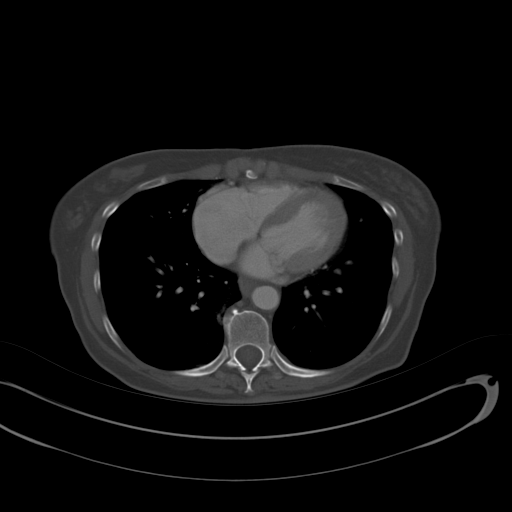
[im 88/122  soft-tissue]
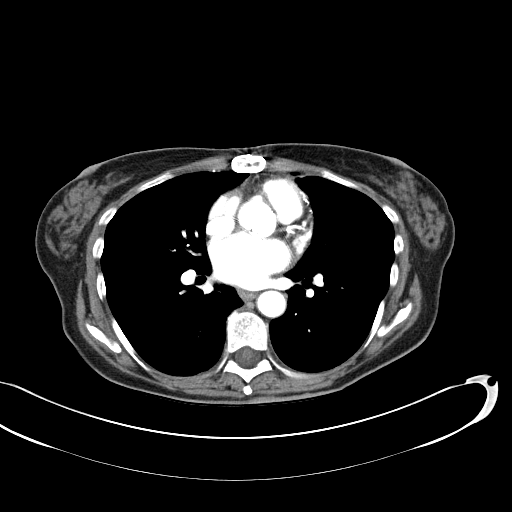
[im 95/122  soft-tissue]
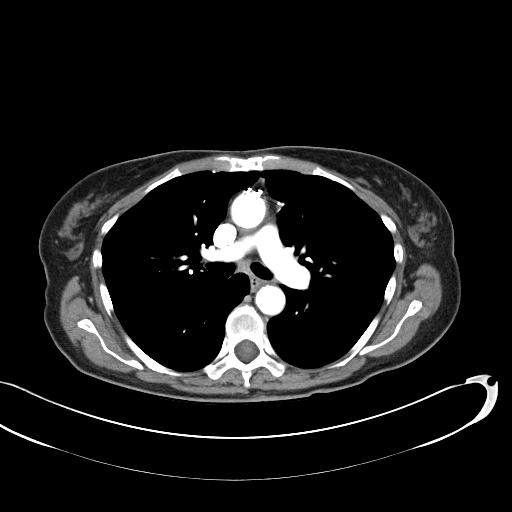
[im 108/122  soft-tissue]
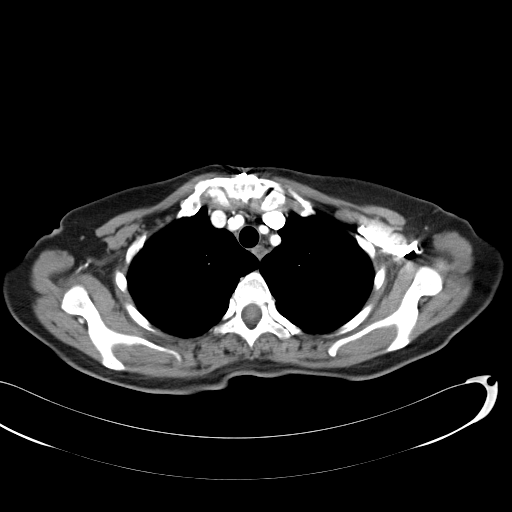
[im 115/122  soft-tissue]
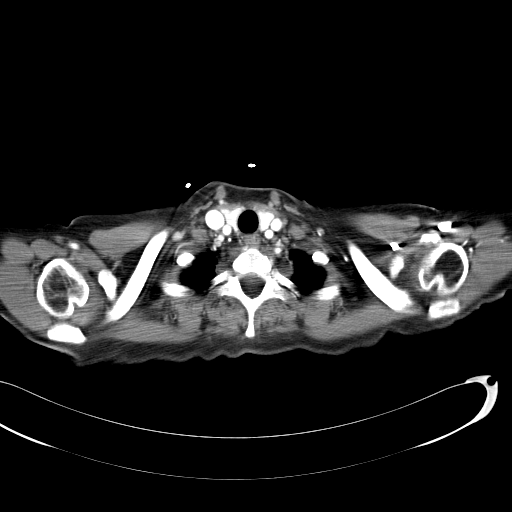

[Series 602: coronal images · coronal · 1.19mm/px · 3 of 68 slices shown]
[im 23/68  soft-tissue]
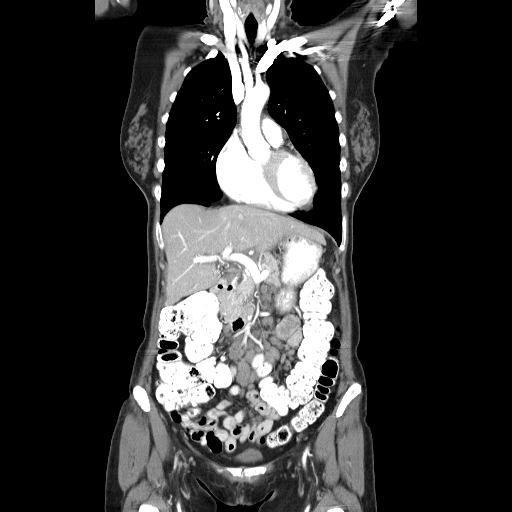
[im 30/68  soft-tissue]
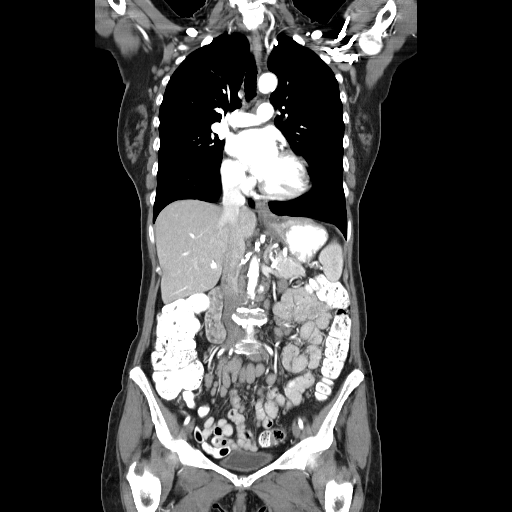
[im 38/68  soft-tissue]
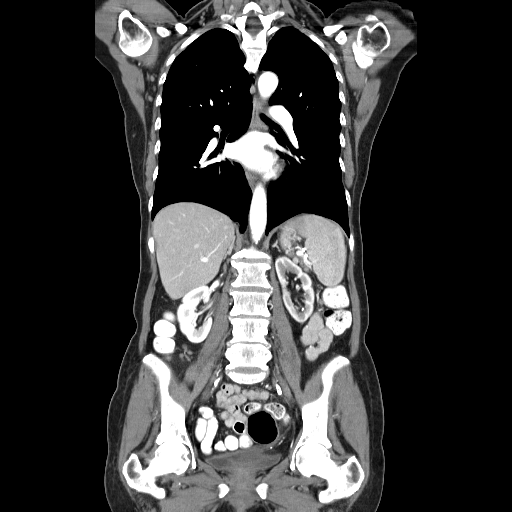

[16 of 46 positions shown; findings below may reference images not displayed]

FINDINGS: CT CHEST FINDINGS

Mediastinum/Nodes: Multiple small right low jugular nodes, including
on image 5 of series 2. Multiple small bilateral axillary and left
subpectoral nodes. Index left subpectoral node measures 1.0 cm on
image 18 of series 2. Aortic valve repair. Aortic and branch vessel
atherosclerosis. Mild cardiomegaly. Native Coronary artery
atherosclerosis, status post CABG. No central pulmonary embolism, on
this non-dedicated study. No mediastinal or hilar adenopathy.

Lungs/Pleura: No pleural fluid. Atelectasis or scarring at the right
lung base and within the anterior left upper lobe. There is also
scarring at the left lung base.

Musculoskeletal: Left-sided paravertebral fluid density T4-5,
including at 11 mm on image 17 of series 2. Less impressive foci at
other sites in the thoracic spine.

CT ABDOMEN AND PELVIS FINDINGS

Hepatobiliary: Normal liver. Cholecystectomy, without biliary ductal
dilatation.

Pancreas: Normal, without mass or ductal dilatation.

Spleen: Normal in size, without focal abnormality.

Adrenals/Urinary Tract: Normal adrenal glands. Moderate left renal
atrophy. 5 mm lower pole left renal collecting system calculus. Too
small to characterize interpolar right renal lesion. Decreased left
renal function. No hydronephrosis. Normal urinary bladder.

Stomach/Bowel: Normal stomach, without wall thickening. Normal colon
and terminal ileum. Normal small bowel caliber.

Vascular/Lymphatic: Advanced aortic and branch vessel
atherosclerosis, including at the origin of both renal arteries.
Significant narrowing of the left renal artery, including image 64
of series 2.

Increased number and size of retroperitoneal nodes. Index aortocaval
node measures 1.0 cm on image 69 of series 2.

Small bowel mesenteric adenopathy. An index node measures 1.5 x
cm on image 90. No pelvic sidewall adenopathy.

Reproductive: Hysterectomy.  No adnexal mass.

Other: No significant free fluid.

Musculoskeletal: No acute osseous abnormality.
IMPRESSION: 1. Increased number and size of nodes within the chest, abdomen, and
likely low neck, most consistent with active lymphoma/leukemia.
2. Advanced atherosclerosis. Left renal atrophy and decreased
function, likely secondary to renal artery stenosis.
3. Left nephrolithiasis.
4. Multiple low-density paraspinal foci within the thoracic spine,
likely perineural cysts.

## 2014-09-18 MED ORDER — IOHEXOL 300 MG/ML  SOLN
100.0000 mL | Freq: Once | INTRAMUSCULAR | Status: AC | PRN
  Administered 2014-09-18: 100 mL via INTRAVENOUS

## 2014-09-20 ENCOUNTER — Encounter: Payer: Self-pay | Admitting: Hematology and Oncology

## 2014-09-20 ENCOUNTER — Ambulatory Visit (HOSPITAL_BASED_OUTPATIENT_CLINIC_OR_DEPARTMENT_OTHER): Payer: Medicare Other | Admitting: Hematology and Oncology

## 2014-09-20 ENCOUNTER — Telehealth: Payer: Self-pay | Admitting: Hematology and Oncology

## 2014-09-20 VITALS — BP 147/55 | HR 71 | Temp 97.5°F | Resp 18 | Ht 60.0 in | Wt 100.0 lb

## 2014-09-20 DIAGNOSIS — C9110 Chronic lymphocytic leukemia of B-cell type not having achieved remission: Secondary | ICD-10-CM

## 2014-09-20 DIAGNOSIS — I350 Nonrheumatic aortic (valve) stenosis: Secondary | ICD-10-CM | POA: Diagnosis not present

## 2014-09-20 DIAGNOSIS — D696 Thrombocytopenia, unspecified: Secondary | ICD-10-CM

## 2014-09-20 NOTE — Assessment & Plan Note (Signed)
I discussed with her and her partner the rationale behind treatment. I felt did the acute on chronic thrombocytopenia could be due to a different etiology. Her blood fibrinogen is low, in the absence of liver disease, is indicative of chronic consumption of platelets. I plan to hold off systemic treatment pending echocardiogram results.

## 2014-09-20 NOTE — Assessment & Plan Note (Addendum)
Fibrinogen level came back low. I reviewed with the patient in simple language that I suspect a low platelet could be due to consumption rather than from CLL. I spoken with the cardiologist to order echocardiogram to exclude valvular leak as a cause of the consumption.

## 2014-09-20 NOTE — Progress Notes (Signed)
[place] OFFICE PROGRESS NOTE  Patient Care Team: Kristie Cowman, MD as PCP - General (Family Medicine) Kristie Cowman, MD (Family Medicine) Terance Ice, MD (Cardiology) Sherren Mocha, MD as Consulting Physician (Cardiology)  SUMMARY OF ONCOLOGIC HISTORY:   CLL (chronic lymphocytic leukemia)   03/21/2011 Initial Diagnosis CLL (chronic lymphocytic leukemia)   01/14/2014 - 01/31/2014 Hospital Admission The patient was hospitalized and was found to severe aortic stenosis causing syncopal episode. She received treatment with steroids with minimum success and required platelet transfusion.   09/18/2014 Imaging CT scan of the chest, abdomen and pelvis show lymphadenopathy but low level burden of disease.    INTERVAL HISTORY: Please see below for problem oriented charting. She feels well. She continues to bruise easily. The patient denies any recent signs or symptoms of bleeding such as spontaneous epistaxis, hematuria or hematochezia.   REVIEW OF SYSTEMS:   Constitutional: Denies fevers, chills or abnormal weight loss Eyes: Denies blurriness of vision Ears, nose, mouth, throat, and face: Denies mucositis or sore throat Respiratory: Denies cough, dyspnea or wheezes Cardiovascular: Denies palpitation, chest discomfort or lower extremity swelling Gastrointestinal:  Denies nausea, heartburn or change in bowel habits Skin: Denies abnormal skin rashes Lymphatics: Denies new lymphadenopathy  Neurological:Denies numbness, tingling or new weaknesses Behavioral/Psych: Mood is stable, no new changes  All other systems were reviewed with the patient and are negative.  I have reviewed the past medical history, past surgical history, social history and family history with the patient and they are unchanged from previous note.  ALLERGIES:  has No Known Allergies.  MEDICATIONS:  Current Outpatient Prescriptions  Medication Sig Dispense Refill  . acetaminophen (TYLENOL) 500 MG  tablet Take 1,000 mg by mouth every 6 (six) hours as needed. pain     . amLODipine (NORVASC) 5 MG tablet Take 1 tablet (5 mg total) by mouth daily. 90 tablet 3  . amoxicillin (AMOXIL) 500 MG tablet Take 4 tablets by mouth one hour prior to dental procedure 8 tablet 2  . atorvastatin (LIPITOR) 10 MG tablet Take 1 tablet (10 mg total) by mouth daily at 6 PM. 90 tablet 3  . lisinopril (PRINIVIL,ZESTRIL) 20 MG tablet Take 1 tablet (20 mg total) by mouth daily. 90 tablet 3  . metoprolol succinate (TOPROL-XL) 50 MG 24 hr tablet Take 1 tablet (50 mg total) by mouth daily. Take with or immediately following a meal. 90 tablet 3   No current facility-administered medications for this visit.    PHYSICAL EXAMINATION: ECOG PERFORMANCE STATUS: 0 - Asymptomatic  Filed Vitals:   09/20/14 0824  BP: 147/55  Pulse: 71  Temp: 97.5 F (36.4 C)  Resp: 18   Filed Weights   09/20/14 0824  Weight: 100 lb (45.36 kg)    GENERAL:alert, no distress and comfortable SKIN: skin color, texture, turgor are normal, no rashes or significant lesions. Noted skin bruising EYES: normal, Conjunctiva are pink and non-injected, sclera clear Musculoskeletal:no cyanosis of digits and no clubbing  NEURO: alert & oriented x 3 with fluent speech, no focal motor/sensory deficits  LABORATORY DATA:  I have reviewed the data as listed    Component Value Date/Time   NA 142 09/13/2014 0848   NA 135 01/21/2014 0420   K 4.2 09/13/2014 0848   K 3.7 01/21/2014 0420   CL 95* 01/21/2014 0420   CL 105 05/20/2012 0823   CO2 28 09/13/2014 0848   CO2 29 01/21/2014 0420   GLUCOSE 84 09/13/2014 0848   GLUCOSE 129* 01/21/2014 0420  GLUCOSE 70 05/20/2012 0823   BUN 22.1 09/13/2014 0848   BUN 18 01/21/2014 0420   CREATININE 1.2* 09/13/2014 0848   CREATININE 1.05 01/21/2014 0420   CALCIUM 10.0 09/13/2014 0848   CALCIUM 8.7 01/21/2014 0420   PROT 5.9* 09/13/2014 0848   PROT 6.5 03/27/2014 0822   ALBUMIN 3.7 09/13/2014 0848    ALBUMIN 4.0 03/27/2014 0822   AST 23 09/13/2014 0848   AST 22 03/27/2014 0822   ALT 17 09/13/2014 0848   ALT 12 03/27/2014 0822   ALKPHOS 74 09/13/2014 0848   ALKPHOS 69 03/27/2014 0822   BILITOT 0.61 09/13/2014 0848   BILITOT 0.6 03/27/2014 0822   GFRNONAA 51* 01/21/2014 0420   GFRAA 60* 01/21/2014 0420    No results found for: SPEP, UPEP  Lab Results  Component Value Date   WBC 8.2 09/13/2014   NEUTROABS 1.5 09/13/2014   HGB 11.7 09/13/2014   HCT 35.1 09/13/2014   MCV 94.4 09/13/2014   PLT 44* 09/13/2014      Chemistry      Component Value Date/Time   NA 142 09/13/2014 0848   NA 135 01/21/2014 0420   K 4.2 09/13/2014 0848   K 3.7 01/21/2014 0420   CL 95* 01/21/2014 0420   CL 105 05/20/2012 0823   CO2 28 09/13/2014 0848   CO2 29 01/21/2014 0420   BUN 22.1 09/13/2014 0848   BUN 18 01/21/2014 0420   CREATININE 1.2* 09/13/2014 0848   CREATININE 1.05 01/21/2014 0420      Component Value Date/Time   CALCIUM 10.0 09/13/2014 0848   CALCIUM 8.7 01/21/2014 0420   ALKPHOS 74 09/13/2014 0848   ALKPHOS 69 03/27/2014 0822   AST 23 09/13/2014 0848   AST 22 03/27/2014 0822   ALT 17 09/13/2014 0848   ALT 12 03/27/2014 0822   BILITOT 0.61 09/13/2014 0848   BILITOT 0.6 03/27/2014 1448       RADIOGRAPHIC STUDIES: I reviewed the CT scan with her and her partner I have personally reviewed the radiological images as listed and agreed with the findings in the report.   ASSESSMENT & PLAN:  CLL (chronic lymphocytic leukemia) I discussed with her and her partner the rationale behind treatment. I felt did the acute on chronic thrombocytopenia could be due to a different etiology. Her blood fibrinogen is low, in the absence of liver disease, is indicative of chronic consumption of platelets. I plan to hold off systemic treatment pending echocardiogram results.   Thrombocytopenia Fibrinogen level came back low. I reviewed with the patient in simple language that I suspect a  low platelet could be due to consumption rather than from CLL. I spoken with the cardiologist to order echocardiogram to exclude valvular leak as a cause of the consumption.  Aortic stenosis She denies any signs or symptoms of significant valvular heart disease after surgery. As above, her cardiologist is aware of her current situation and will order echocardiogram next week for further evaluation.  In the meantime, I recommend she hold aspirin until platelet count is greater than 50,000.  All questions were answered. The patient knows to call the clinic with any problems, questions or concerns. No barriers to learning was detected. I spent 15 minutes counseling the patient face to face. The total time spent in the appointment was 20 minutes and more than 50% was on counseling and review of test results     [facility], [place], MD 09/20/2014 9:36 AM

## 2014-09-20 NOTE — Assessment & Plan Note (Signed)
She denies any signs or symptoms of significant valvular heart disease after surgery. As above, her cardiologist is aware of her current situation and will order echocardiogram next week for further evaluation.

## 2014-09-20 NOTE — Telephone Encounter (Signed)
gv pt avs °

## 2014-09-28 ENCOUNTER — Ambulatory Visit (HOSPITAL_COMMUNITY): Payer: Medicare Other | Attending: Cardiovascular Disease

## 2014-09-28 ENCOUNTER — Other Ambulatory Visit: Payer: Self-pay

## 2014-09-28 DIAGNOSIS — I341 Nonrheumatic mitral (valve) prolapse: Secondary | ICD-10-CM | POA: Insufficient documentation

## 2014-09-28 DIAGNOSIS — I340 Nonrheumatic mitral (valve) insufficiency: Secondary | ICD-10-CM | POA: Diagnosis not present

## 2014-09-28 DIAGNOSIS — I351 Nonrheumatic aortic (valve) insufficiency: Secondary | ICD-10-CM | POA: Diagnosis not present

## 2014-09-28 DIAGNOSIS — I359 Nonrheumatic aortic valve disorder, unspecified: Secondary | ICD-10-CM | POA: Diagnosis present

## 2014-10-02 ENCOUNTER — Telehealth: Payer: Self-pay | Admitting: Hematology and Oncology

## 2014-10-02 ENCOUNTER — Other Ambulatory Visit: Payer: Self-pay | Admitting: Hematology and Oncology

## 2014-10-02 DIAGNOSIS — C9110 Chronic lymphocytic leukemia of B-cell type not having achieved remission: Secondary | ICD-10-CM

## 2014-10-02 DIAGNOSIS — D696 Thrombocytopenia, unspecified: Secondary | ICD-10-CM

## 2014-10-02 MED ORDER — PREDNISONE 10 MG PO TABS: 10.0000 mg | ORAL_TABLET | Freq: Every day | ORAL | Status: DC

## 2014-10-02 NOTE — Telephone Encounter (Signed)
lvm for pt regarding to OCT appt....mailed pt appt sched adn letter

## 2014-10-02 NOTE — Telephone Encounter (Signed)
I reviewed the recent echocardiogram report with the patient's. The mild perivalvular leak is not significant. I discussed treatment options with the patient's. She is willing to try prednisone again at 10 mg daily by mouth. I'll see her back on October 7 repeat history, physical examination and blood work.

## 2014-10-13 ENCOUNTER — Telehealth: Payer: Self-pay | Admitting: Hematology and Oncology

## 2014-10-13 ENCOUNTER — Other Ambulatory Visit (HOSPITAL_BASED_OUTPATIENT_CLINIC_OR_DEPARTMENT_OTHER): Payer: Medicare Other

## 2014-10-13 ENCOUNTER — Encounter: Payer: Self-pay | Admitting: Hematology and Oncology

## 2014-10-13 ENCOUNTER — Ambulatory Visit (HOSPITAL_BASED_OUTPATIENT_CLINIC_OR_DEPARTMENT_OTHER): Payer: Medicare Other | Admitting: Hematology and Oncology

## 2014-10-13 VITALS — BP 165/43 | HR 67 | Temp 97.7°F | Resp 20 | Ht 61.0 in | Wt 100.6 lb

## 2014-10-13 DIAGNOSIS — D696 Thrombocytopenia, unspecified: Secondary | ICD-10-CM

## 2014-10-13 DIAGNOSIS — C9110 Chronic lymphocytic leukemia of B-cell type not having achieved remission: Secondary | ICD-10-CM

## 2014-10-13 DIAGNOSIS — Z952 Presence of prosthetic heart valve: Secondary | ICD-10-CM

## 2014-10-13 LAB — COMPREHENSIVE METABOLIC PANEL (CC13)
ALBUMIN: 3.7 g/dL (ref 3.5–5.0)
ALK PHOS: 62 U/L (ref 40–150)
ALT: 18 U/L (ref 0–55)
ANION GAP: 8 meq/L (ref 3–11)
AST: 21 U/L (ref 5–34)
BILIRUBIN TOTAL: 0.61 mg/dL (ref 0.20–1.20)
BUN: 18.9 mg/dL (ref 7.0–26.0)
CALCIUM: 9.6 mg/dL (ref 8.4–10.4)
CO2: 27 mEq/L (ref 22–29)
CREATININE: 1.2 mg/dL — AB (ref 0.6–1.1)
Chloride: 103 mEq/L (ref 98–109)
EGFR: 45 mL/min/{1.73_m2} — ABNORMAL LOW (ref >90)
Glucose: 115 mg/dl (ref 70–140)
Potassium: 3.9 mEq/L (ref 3.5–5.1)
Sodium: 138 mEq/L (ref 136–145)
Total Protein: 5.7 g/dL — ABNORMAL LOW (ref 6.4–8.3)

## 2014-10-13 LAB — CBC WITH DIFFERENTIAL/PLATELET
BASO%: 0.9 % (ref 0.0–2.0)
BASOS ABS: 0.2 10*3/uL — AB (ref 0.0–0.1)
EOS%: 0.5 % (ref 0.0–7.0)
Eosinophils Absolute: 0.1 10*3/uL (ref 0.0–0.5)
HEMATOCRIT: 38.1 % (ref 34.8–46.6)
HEMOGLOBIN: 12.3 g/dL (ref 11.6–15.9)
LYMPH#: 16.8 10*3/uL — AB (ref 0.9–3.3)
LYMPH%: 86.1 % — ABNORMAL HIGH (ref 14.0–49.7)
MCH: 30.8 pg (ref 25.1–34.0)
MCHC: 32.4 g/dL (ref 31.5–36.0)
MCV: 95.2 fL (ref 79.5–101.0)
MONO#: 0.2 10*3/uL (ref 0.1–0.9)
MONO%: 1 % (ref 0.0–14.0)
NEUT#: 2.2 10*3/uL (ref 1.5–6.5)
NEUT%: 11.5 % — AB (ref 38.4–76.8)
PLATELETS: 57 10*3/uL — AB (ref 145–400)
RBC: 4 10*6/uL (ref 3.70–5.45)
RDW: 15.6 % — AB (ref 11.2–14.5)
WBC: 19.5 10*3/uL — ABNORMAL HIGH (ref 3.9–10.3)

## 2014-10-13 LAB — TECHNOLOGIST REVIEW

## 2014-10-13 MED ORDER — PREDNISONE 10 MG PO TABS: 10.0000 mg | ORAL_TABLET | Freq: Every day | ORAL | Status: DC

## 2014-10-13 NOTE — Assessment & Plan Note (Signed)
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 It appears that with low-dose prednisone, her platelet count is improving. I recommend we hold off further cardiology evaluation until I see her again in a few weeks.

## 2014-10-13 NOTE — Telephone Encounter (Signed)
Gave and printed appt sched and avs fo rpt for OCT °

## 2014-10-13 NOTE — Assessment & Plan Note (Signed)
She had recent very mild valvular leak that could be causing consumption of platelets. Clinically, she is not symptomatic. I will defer to her cardiologist for further management but would prefer to hold off further cardiac intervention to give prednisone a few more weeks to work on her platelet count

## 2014-10-13 NOTE — Assessment & Plan Note (Signed)
I discussed with her and her partner the rationale behind treatment. I felt did the acute on chronic thrombocytopenia could be due to a different etiology. Her blood fibrinogen is low, in the absence of liver disease, is indicative of chronic consumption of platelets. I plan to hold off systemic treatment and she was placed on low-dose prednisone instead. It appears that her platelet counts are improving with now platelet count greater than 50,000. I will continue same dose of prednisone for now.

## 2014-10-13 NOTE — Progress Notes (Signed)
[facility] OFFICE PROGRESS NOTE  Patient Care Team: Kristie Cowman, MD as PCP - General (Family Medicine) Kristie Cowman, MD (Family Medicine) Terance Ice, MD (Cardiology) Sherren Mocha, MD as Consulting Physician (Cardiology)  SUMMARY OF ONCOLOGIC HISTORY:   CLL (chronic lymphocytic leukemia) ([place])   03/21/2011 Initial Diagnosis CLL (chronic lymphocytic leukemia)   01/14/2014 - 01/31/2014 Hospital Admission The patient was hospitalized and was found to severe aortic stenosis causing syncopal episode. She received treatment with steroids with minimum success and required platelet transfusion.   09/18/2014 Imaging CT scan of the chest, abdomen and pelvis show lymphadenopathy but low level burden of disease.   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 There was no stenosis. Mild peri-valvular aortic insufficiency is noted     INTERVAL HISTORY: Please see below for problem oriented charting. She feels well. Denies side effects of prednisone. She bruises easily. The patient denies any recent signs or symptoms of bleeding such as spontaneous epistaxis, hematuria or hematochezia. She denies signs and symptoms of chest pain, shortness of breath or palpitation  REVIEW OF SYSTEMS:   Constitutional: Denies fevers, chills or abnormal weight loss Eyes: Denies blurriness of vision Ears, nose, mouth, throat, and face: Denies mucositis or sore throat Respiratory: Denies cough, dyspnea or wheezes Cardiovascular: Denies palpitation, chest discomfort or lower extremity swelling Gastrointestinal:  Denies nausea, heartburn or change in bowel habits Skin: Denies abnormal skin rashes Lymphatics: Denies new lymphadenopathy  Neurological:Denies numbness, tingling or new weaknesses Behavioral/Psych: Mood is stable, no new changes  All other systems were reviewed with the patient  and are negative.  I have reviewed the past medical history, past surgical history, social history and family history with the patient and they are unchanged from previous note.  ALLERGIES:  has No Known Allergies.  MEDICATIONS:  Current Outpatient Prescriptions  Medication Sig Dispense Refill  . acetaminophen (TYLENOL) 500 MG tablet Take 1,000 mg by mouth every 6 (six) hours as needed. pain     . amLODipine (NORVASC) 5 MG tablet Take 1 tablet (5 mg total) by mouth daily. 90 tablet 3  . atorvastatin (LIPITOR) 10 MG tablet Take 1 tablet (10 mg total) by mouth daily at 6 PM. 90 tablet 3  . lisinopril (PRINIVIL,ZESTRIL) 20 MG tablet Take 1 tablet (20 mg total) by mouth daily. 90 tablet 3  . metoprolol succinate (TOPROL-XL) 50 MG 24 hr tablet Take 1 tablet (50 mg total) by mouth daily. Take with or immediately following a meal. 90 tablet 3  . predniSONE (DELTASONE) 10 MG tablet Take 1 tablet (10 mg total) by mouth daily with breakfast. 30 tablet 0  . amoxicillin (AMOXIL) 500 MG tablet Take 4 tablets by mouth one hour prior to dental procedure (Patient not taking: Reported on 10/13/2014) 8 tablet 2   No current facility-administered medications for this visit.    PHYSICAL EXAMINATION: ECOG PERFORMANCE STATUS: 0 - Asymptomatic  Filed Vitals:   10/13/14 0817  BP: 165/43  Pulse: 67  Temp: 97.7 F (36.5 C)  Resp: 20   Filed Weights   10/13/14 0817  Weight: 100 lb 9.6 oz (45.632 kg)    GENERAL:alert, no distress and comfortable SKIN: skin color, texture, turgor are normal, no rashes or significant lesions. Noted mild skin bruises but no petechiae EYES: normal, Conjunctiva are pink and non-injected, sclera clear OROPHARYNX:no exudate, no erythema and lips, buccal mucosa, and tongue normal  Musculoskeletal:no cyanosis  of digits and no clubbing  NEURO: alert & oriented x 3 with fluent speech, no focal motor/sensory deficits  LABORATORY DATA:  I have reviewed the data as listed     Component Value Date/Time   NA 138 10/13/2014 0806   NA 135 01/21/2014 0420   K 3.9 10/13/2014 0806   K 3.7 01/21/2014 0420   CL 95* 01/21/2014 0420   CL 105 05/20/2012 0823   CO2 27 10/13/2014 0806   CO2 29 01/21/2014 0420   GLUCOSE 115 10/13/2014 0806   GLUCOSE 129* 01/21/2014 0420   GLUCOSE 70 05/20/2012 0823   BUN 18.9 10/13/2014 0806   BUN 18 01/21/2014 0420   CREATININE 1.2* 10/13/2014 0806   CREATININE 1.05 01/21/2014 0420   CALCIUM 9.6 10/13/2014 0806   CALCIUM 8.7 01/21/2014 0420   PROT 5.7* 10/13/2014 0806   PROT 6.5 03/27/2014 0822   ALBUMIN 3.7 10/13/2014 0806   ALBUMIN 4.0 03/27/2014 0822   AST 21 10/13/2014 0806   AST 22 03/27/2014 0822   ALT 18 10/13/2014 0806   ALT 12 03/27/2014 0822   ALKPHOS 62 10/13/2014 0806   ALKPHOS 69 03/27/2014 0822   BILITOT 0.61 10/13/2014 0806   BILITOT 0.6 03/27/2014 0822   GFRNONAA 51* 01/21/2014 0420   GFRAA 60* 01/21/2014 0420    No results found for: SPEP, UPEP  Lab Results  Component Value Date   WBC 19.5* 10/13/2014   NEUTROABS 2.2 10/13/2014   HGB 12.3 10/13/2014   HCT 38.1 10/13/2014   MCV 95.2 10/13/2014   PLT 57* 10/13/2014      Chemistry      Component Value Date/Time   NA 138 10/13/2014 0806   NA 135 01/21/2014 0420   K 3.9 10/13/2014 0806   K 3.7 01/21/2014 0420   CL 95* 01/21/2014 0420   CL 105 05/20/2012 0823   CO2 27 10/13/2014 0806   CO2 29 01/21/2014 0420   BUN 18.9 10/13/2014 0806   BUN 18 01/21/2014 0420   CREATININE 1.2* 10/13/2014 0806   CREATININE 1.05 01/21/2014 0420      Component Value Date/Time   CALCIUM 9.6 10/13/2014 0806   CALCIUM 8.7 01/21/2014 0420   ALKPHOS 62 10/13/2014 0806   ALKPHOS 69 03/27/2014 0822   AST 21 10/13/2014 0806   AST 22 03/27/2014 0822   ALT 18 10/13/2014 0806   ALT 12 03/27/2014 0822   BILITOT 0.61 10/13/2014 0806   BILITOT 0.6 03/27/2014 0822     ASSESSMENT & PLAN:  CLL (chronic lymphocytic leukemia) I discussed with her and her partner the  rationale behind treatment. I felt did the acute on chronic thrombocytopenia could be due to a different etiology. Her blood fibrinogen is low, in the absence of liver disease, is indicative of chronic consumption of platelets. I plan to hold off systemic treatment and she was placed on low-dose prednisone instead. It appears that her platelet counts are improving with now platelet count greater than 50,000. I will continue same dose of prednisone for now.   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 It appears that with low-dose prednisone, her platelet count is improving. I recommend we hold off further cardiology evaluation until I see her again in a few weeks.    S/P AVR She had recent very mild valvular leak that could be causing consumption of  platelets. Clinically, she is not symptomatic. I will defer to her cardiologist for further management but would prefer to hold off further cardiac intervention to give prednisone a few more weeks to work on her platelet count   Orders Placed This Encounter  Procedures  . CBC with Differential/Platelet    Standing Status: Standing     Number of Occurrences: 9     Standing Expiration Date: 10/13/2015   All questions were answered. The patient knows to call the clinic with any problems, questions or concerns. No barriers to learning was detected. I spent 15 minutes counseling the patient face to face. The total time spent in the appointment was 20 minutes and more than 50% was on counseling and review of test results     [facility], [place], MD 10/13/2014 5:28 PM

## 2014-10-16 LAB — FIBRINOGEN: FIBRINOGEN: 200 mg/dL — AB (ref 204–475)

## 2014-10-27 ENCOUNTER — Ambulatory Visit (INDEPENDENT_AMBULATORY_CARE_PROVIDER_SITE_OTHER): Payer: Medicare Other | Admitting: Cardiovascular Disease

## 2014-10-27 ENCOUNTER — Encounter: Payer: Self-pay | Admitting: Cardiovascular Disease

## 2014-10-27 VITALS — BP 172/70 | HR 66 | Ht 60.0 in | Wt 101.4 lb

## 2014-10-27 DIAGNOSIS — I2510 Atherosclerotic heart disease of native coronary artery without angina pectoris: Secondary | ICD-10-CM | POA: Diagnosis not present

## 2014-10-27 NOTE — Patient Instructions (Signed)
Medication Instructions:  Your physician recommends that you continue on your current medications as directed. Please refer to the Current Medication list given to you today.  Labwork: Your physician recommends that you return for a FASTING LIPID and LIVER in 6 MONTHS--nothing to eat or drink after midnight, lab opens at 7:30 AM.  Testing/Procedures: No new orders.   Follow-Up: Your physician wants you to follow-up in: 6 MONTHS with Dr Burt Knack.  You will receive a reminder letter in the mail two months in advance. If you don't receive a letter, please call our office to schedule the follow-up appointment.  Any Other Special Instructions Will Be Listed Below (If Applicable).  Your physician has requested that you regularly monitor and record your blood pressure readings at home. Please use the same machine at the same time of day to check your readings and record them to bring to your follow-up visit. If your BP is consistently above 140/90 please call the office so we can make adjustments in your medication.

## 2014-10-27 NOTE — Progress Notes (Signed)
Cardiology Office Note Date:  10/27/2014   ID:  Nicole Mercado, DOB 11-25-1940, MRN [id number]  PCP:  Andria Frames, MD  Cardiologist:  Sherren Mocha, MD    Chief Complaint  Patient presents with  . Follow-up    no complaints   History of Present Illness: Nicole Mercado is a 74 y.o. female who presents for follow-up evaluation, last seen in March 2016. She is followed for coronary and valvular heart disease. The patient has a history of CLL with chronic thrombocytopenia, hypertension, carotid stenosis status post left carotid endarterectomy in 2013, and severe aortic stenosis. She was hospitalized in January 2016 with syncope. She was found to have very severe aortic stenosis as well as multivessel coronary artery disease with involvement of the distal left main, LAD, and circumflex branches. She underwent CABG and bioprosthetic aortic valve replacement by Dr. Cyndia Bent with a LIMA to LAD, saphenous vein graft to OM1, saphenous vein graft to diagonals, and bioprosthetic aortic valve replacement.  Recently she has been noted to have progressive thrombocytopenia, possibly out of proportion to the disease activity of her CLL. An echo showed mild paravalvular leak and there is question of whether this is contributing to platelet consumption.  She feels great. No chest pain, shortness of breath, or edema. No bleeding problems. She is off of ASA because of thrombocytopenia.    Past Medical History  Diagnosis Date  . Thrombocytopenia ([place]) 11/19/2010  . Hypertension   . CLL (chronic lymphocytic leukemia) (HCC)     slow leukemia---Dr  Murinson  . Arthritis   . Aortic stenosis     a. Echocardiogram (01/11/14):  Mild LVH, EF 60-65%, Gr 1 DD, possible bicuspid AV, severe AS (mean 61 mmHg, peak 104 mmHg), mild MVP of post leaflet, mild MR, PASP 33 mmHg.;  b. 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 Pancreatitis     h/o  . Carotid artery occlusion     a. s/p L CEA 2013;  b.  Carotid US (1/16):  Bilateral  ICA 1-39%  . CAD (coronary artery disease)     a. LHC (01/13/14):  dLM 70 extending into oLAD, mLAD 40-50, pD1 80, pD2 70, ostial/prox OM1 70 >> CABG (L-LAD, S-OM1, S-D1, S-D2)    . Hx of cardiovascular stress test     a. Nuclear stress test That (4/13): Normal perfusion, EF 74%  . Atrial fibrillation ([place])     post op after CABG+AVR >> Amiodarone  . Hx of echocardiogram     Echo (2/16):  Mild LVH, EF 60-65%, no RWMA, Gr 2 DD, AVR ok (mean 9 mmHg), trivial AI, mild MR, mild LAE, PASP 40 mmHg  . H/O exercise stress test     NSSTT    Past Surgical History  Procedure Laterality Date  . Cesarean section      FIVE  . Cholecystectomy    . Appendectomy    . Abdominal hysterectomy    . Tonsillectomy    . Endarterectomy  06/09/2011    Procedure: ENDARTERECTOMY CAROTID;  Surgeon: Rosetta Posner, MD;  Location: [facility] OR;  Service: Vascular;  Laterality: Left;  left carotid endarterectomy with patch angioplasty  . Carotid endarterectomy  06/09/11    LEFT  cea  . Left and right heart catheterization with coronary angiogram [name] 01/13/2014    Procedure: LEFT AND RIGHT HEART CATHETERIZATION WITH CORONARY ANGIOGRAM;  Surgeon: Jettie Booze, MD;  Location: [facility] CATH LAB;  Service: Cardiovascular;  Laterality: [name];  . Coronary artery bypass graft  [name] 01/17/2014    Procedure: CORONARY ARTERY BYPASS GRAFTING (CABG)TIMES FOUR USING LEFT INTERNAL MAMMARY ARTERY AND RIGHT SAPHENOUS VEIN HARVESTED ENDOSCOPICALLY;  Surgeon: Gaye Pollack, MD;  Location: [place];  Service: Open Heart Surgery;  Laterality: [name];  . Aortic valve replacement [name] 01/17/2014    Procedure: AORTIC VALVE REPLACEMENT (AVR);  Surgeon: Gaye Pollack, MD;  Location: [place];  Service: Open Heart Surgery;  Laterality: [name];  . Tee without cardioversion [name] 01/17/2014    Procedure: TRANSESOPHAGEAL ECHOCARDIOGRAM (TEE);  Surgeon: Gaye Pollack, MD;  Location: [place];  Service: Open Heart Surgery;  Laterality: [name];    Current Outpatient Prescriptions    Medication Sig Dispense Refill  . acetaminophen (TYLENOL) 500 MG tablet Take 1,000 mg by mouth every 6 (six) hours as needed. pain     . amLODipine (NORVASC) 5 MG tablet Take 1 tablet (5 mg total) by mouth daily. 90 tablet 3  . amoxicillin (AMOXIL) 500 MG tablet Take 4 tablets by mouth one hour prior to dental procedure 8 tablet 2  . atorvastatin (LIPITOR) 10 MG tablet Take 1 tablet (10 mg total) by mouth daily at 6 PM. 90 tablet 3  . lisinopril (PRINIVIL,ZESTRIL) 20 MG tablet Take 1 tablet (20 mg total) by mouth daily. 90 tablet 3  . metoprolol succinate (TOPROL-XL) 50 MG 24 hr tablet Take 1 tablet (50 mg total) by mouth daily. Take with or immediately following a meal. 90 tablet 3  . predniSONE (DELTASONE) 10 MG tablet Take 1 tablet (10 mg total) by mouth daily with breakfast. 30 tablet 0   No current facility-administered medications for this visit.    Allergies:   Review of patient's allergies indicates no known allergies.   Social History:  The patient  reports that she has never smoked. She has never used smokeless tobacco. She reports that she does not drink alcohol or use illicit drugs.   Family History:  The patient's  family history includes Heart attack in her mother; Heart disease in her father and mother; Hypertension in her father, mother, sister, and son; Stroke in her paternal grandfather and paternal grandmother.    ROS:  Please see the history of present illness.  All other systems are reviewed and negative.    PHYSICAL EXAM: VS:  BP 172/70 mmHg  Pulse 66  Ht 5' (1.524 m)  Wt 101 lb 6.4 oz (45.995 kg)  BMI 19.80 kg/m2 , BMI Body mass index is 19.8 kg/(m^2). GEN: Well nourished, well developed, in no acute distress HEENT: normal Neck: no JVD, no masses. No carotid bruits Cardiac: RRR with 2/6 SEM at the RUSB and 1/6 diastolic decrescendo murmur at the LLSB               Respiratory:  clear to auscultation bilaterally, normal work of breathing GI: soft, nontender,  nondistended, + BS MS: no deformity or atrophy Ext: no pretibial edema, pedal pulses 2+= bilaterally Skin: warm and dry, no rash Neuro:  Strength and sensation are intact Psych: euthymic mood, full affect  EKG:  EKG is ordered today. The ekg ordered today shows sinus rhythm with PACs, heart rate 66 bpm, cannot rule out age-indeterminate anteroseptal MI.  Recent Labs: 01/11/2014: TSH 0.705 01/18/2014: Magnesium 2.4 10/13/2014: ALT 18; BUN 18.9; Creatinine 1.2*; HGB 12.3; Platelets 57*; Potassium 3.9; Sodium 138   Lipid Panel     Component Value Date/Time   CHOL 118 03/27/2014 0822   TRIG 150.0* 03/27/2014 0822   HDL 42.30 03/27/2014 4627  CHOLHDL 3 03/27/2014 0822   VLDL 30.0 03/27/2014 0822   LDLCALC 46 03/27/2014 0822      Wt Readings from Last 3 Encounters:  10/27/14 101 lb 6.4 oz (45.995 kg)  10/13/14 100 lb 9.6 oz (45.632 kg)  09/20/14 100 lb (45.36 kg)     Cardiac Studies Reviewed: 2D Echo: Study Conclusions  - Left ventricle: The cavity size was normal. Wall thickness was normal. Septal bounce consistent with prior cardiac surgery. Systolic function was normal. The estimated ejection fraction was in the range of 55% to 60%. Wall motion was normal; there were no regional wall motion abnormalities. Doppler parameters are consistent with abnormal left ventricular relaxation (grade 1 diastolic dysfunction). - Aortic valve: Bioprosthetic aortic valve. There was no stenosis. Mild peri-valvular aortic insufficiency. Mean gradient (S): 9 mm Hg. - Mitral valve: Primarily posterior leaflet prolapse. There was mild regurgitation. - Right ventricle: The cavity size was normal. Systolic function was normal. - Tricuspid valve: Peak RV-RA gradient (S): 20 mm Hg. - Pulmonary arteries: PA peak pressure: 23 mm Hg (S). - Inferior vena cava: The vessel was normal in size. The respirophasic diameter changes were in the normal range (= 50%), consistent with  normal central venous pressure.  Impressions:  - Normal LV size and systolic function, EF 65-78%. Normal RV size and systolic function. Bioprosthetic aortic valve with mild peri-valvular aortic insufficiency. Mild posterior leaflet mitral valve prolapse with mild MR.  ASSESSMENT AND PLAN: 1.  Aortic valve disease s/p bioprosthetic aortic valve replacement. I personally reviewed her echo images and there is mild paravalvular leak. Will follow-up in 6 months.  2. CAD s/p CABG: feeling well without symptoms of angina. No antiplatelet Rx because of thrombocytopenia. Continue lisinopril and metoprolol succinate, atorvastatin.  3. Essential hypertension: pt has white-coat HTN but hasn't been checking BP at home recently. She feels strongly that BP is not an issue. Asked her to check and record BP's, call us if BP > 140/90  4. Hyperlipidemia: lipids reviewed and at goal. On atorvastatin 10 mg daily.  5. Carotid stenosis without history of stroke: followed by vascular surgery  6. CLL: followed by Dr Alvy Bimler. Now on prednisone with repeat labs and office visit pending next week  Current medicines are reviewed with the patient today.  The patient does not have concerns regarding medicines.  Labs/ tests ordered today include:   Orders Placed This Encounter  Procedures  . Lipid panel  . Hepatic function panel  . EKG 12-Lead    Disposition:   FU 6 months  Signed, Sherren Mocha, MD  10/27/2014 10:15 AM    [facility] [address], [place], [place]  [address] Phone: [phone]; Fax: [phone]

## 2014-10-30 ENCOUNTER — Encounter: Payer: Self-pay | Admitting: Hematology and Oncology

## 2014-10-30 ENCOUNTER — Ambulatory Visit (HOSPITAL_BASED_OUTPATIENT_CLINIC_OR_DEPARTMENT_OTHER): Payer: Medicare Other | Admitting: Hematology and Oncology

## 2014-10-30 ENCOUNTER — Telehealth: Payer: Self-pay | Admitting: Pharmacist

## 2014-10-30 ENCOUNTER — Other Ambulatory Visit: Payer: Medicare Other

## 2014-10-30 VITALS — BP 159/41 | HR 57 | Temp 97.8°F | Resp 17 | Ht 60.0 in | Wt 102.1 lb

## 2014-10-30 DIAGNOSIS — C9110 Chronic lymphocytic leukemia of B-cell type not having achieved remission: Secondary | ICD-10-CM

## 2014-10-30 DIAGNOSIS — I10 Essential (primary) hypertension: Secondary | ICD-10-CM

## 2014-10-30 LAB — CBC WITH DIFFERENTIAL/PLATELET
BASO%: 0.2 % (ref 0.0–2.0)
Basophils Absolute: 0 10*3/uL (ref 0.0–0.1)
EOS ABS: 0.1 10*3/uL (ref 0.0–0.5)
EOS%: 0.3 % (ref 0.0–7.0)
HEMATOCRIT: 39.4 % (ref 34.8–46.6)
HEMOGLOBIN: 13 g/dL (ref 11.6–15.9)
LYMPH#: 14.9 10*3/uL — AB (ref 0.9–3.3)
LYMPH%: 83.3 % — ABNORMAL HIGH (ref 14.0–49.7)
MCH: 31.6 pg (ref 25.1–34.0)
MCHC: 33 g/dL (ref 31.5–36.0)
MCV: 95.9 fL (ref 79.5–101.0)
MONO#: 0.6 10*3/uL (ref 0.1–0.9)
MONO%: 3.5 % (ref 0.0–14.0)
NEUT#: 2.3 10*3/uL (ref 1.5–6.5)
NEUT%: 12.7 % — AB (ref 38.4–76.8)
Platelets: 41 10*3/uL — ABNORMAL LOW (ref 145–400)
RBC: 4.11 10*6/uL (ref 3.70–5.45)
RDW: 14.6 % — ABNORMAL HIGH (ref 11.2–14.5)
WBC: 17.9 10*3/uL — AB (ref 3.9–10.3)
nRBC: 0 % (ref 0–0)

## 2014-10-30 LAB — TECHNOLOGIST REVIEW

## 2014-10-30 MED ORDER — IBRUTINIB 140 MG PO CAPS: 420.0000 mg | ORAL_CAPSULE | Freq: Every day | ORAL | Status: DC

## 2014-10-30 NOTE — Assessment & Plan Note (Signed)
Unfortunately, she stopped responding to low-dose prednisone.  she could not tolerate with some side effects of prednisone.  I recommend definitive therapy with Ibrutinib.  the risks, benefit, side effects of Ibrutinib were fully discussed with the patient and her significant other and she agreed to proceed. I will proceed to taper her off prednisone and start Irbutinib as soon as possible. I will bring her in for weekly blood draw after start of treatment and reassess within the month.

## 2014-10-30 NOTE — Progress Notes (Signed)
[facility] OFFICE PROGRESS NOTE  Patient Care Team: Kristie Cowman, MD as PCP - General (Family Medicine) Kristie Cowman, MD (Family Medicine) Terance Ice, MD (Cardiology) Sherren Mocha, MD as Consulting Physician (Cardiology)  SUMMARY OF ONCOLOGIC HISTORY:   CLL (chronic lymphocytic leukemia) ([place])   03/21/2011 Initial Diagnosis CLL (chronic lymphocytic leukemia)   01/14/2014 - 01/31/2014 Hospital Admission The patient was hospitalized and was found to severe aortic stenosis causing syncopal episode. She received treatment with steroids with minimum success and required platelet transfusion.   09/18/2014 Imaging CT scan of the chest, abdomen and pelvis show lymphadenopathy but low level burden of disease.   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 There was no stenosis. Mild peri-valvular aortic insufficiency is noted    INTERVAL HISTORY: Please see below for problem oriented charting.  she returns for further follow-up. The patient stated she has some weight gain with prednisone. The patient denies any recent signs or symptoms of bleeding such as spontaneous epistaxis, hematuria or hematochezia.   REVIEW OF SYSTEMS:   Constitutional: Denies fevers, chills or abnormal weight loss Eyes: Denies blurriness of vision Ears, nose, mouth, throat, and face: Denies mucositis or sore throat Respiratory: Denies cough, dyspnea or wheezes Cardiovascular: Denies palpitation, chest discomfort or lower extremity swelling Gastrointestinal:  Denies nausea, heartburn or change in bowel habits Skin: Denies abnormal skin rashes Lymphatics: Denies new lymphadenopathy or easy bruising Neurological:Denies numbness, tingling or new weaknesses Behavioral/Psych: Mood is stable, no new changes  All other systems were reviewed with the patient and are negative.  I have reviewed the  past medical history, past surgical history, social history and family history with the patient and they are unchanged from previous note.  ALLERGIES:  has No Known Allergies.  MEDICATIONS:  Current Outpatient Prescriptions  Medication Sig Dispense Refill  . acetaminophen (TYLENOL) 500 MG tablet Take 1,000 mg by mouth every 6 (six) hours as needed. pain     . amLODipine (NORVASC) 5 MG tablet Take 1 tablet (5 mg total) by mouth daily. 90 tablet 3  . amoxicillin (AMOXIL) 500 MG tablet Take 4 tablets by mouth one hour prior to dental procedure 8 tablet 2  . atorvastatin (LIPITOR) 10 MG tablet Take 1 tablet (10 mg total) by mouth daily at 6 PM. 90 tablet 3  . lisinopril (PRINIVIL,ZESTRIL) 20 MG tablet Take 1 tablet (20 mg total) by mouth daily. 90 tablet 3  . metoprolol succinate (TOPROL-XL) 50 MG 24 hr tablet Take 1 tablet (50 mg total) by mouth daily. Take with or immediately following a meal. 90 tablet 3  . predniSONE (DELTASONE) 10 MG tablet Take 1 tablet (10 mg total) by mouth daily with breakfast. 30 tablet 0  . ibrutinib (IMBRUVICA) 140 MG capsul Take 3 capsules (420 mg total) by mouth daily. 90 capsule 6   No current facility-administered medications for this visit.    PHYSICAL EXAMINATION: ECOG PERFORMANCE STATUS: 0 - Asymptomatic  Filed Vitals:   10/30/14 0826  BP: 159/41  Pulse: 57  Temp: 97.8 F (36.6 C)  Resp: 17   Filed Weights   10/30/14 0826  Weight: 102 lb 1.6 oz (46.312 kg)    GENERAL:alert, no distress and comfortable SKIN: skin color, texture, turgor are normal, no rashes or significant lesions EYES: normal, Conjunctiva are pink and non-injected, sclera clear Musculoskeletal:no cyanosis of digits and no clubbing  NEURO: alert & oriented x 3  with fluent speech, no focal motor/sensory deficits  LABORATORY DATA:  I have reviewed the data as listed    Component Value Date/Time   NA 138 10/13/2014 0806   NA 135 01/21/2014 0420   K 3.9 10/13/2014 0806   K 3.7  01/21/2014 0420   CL 95* 01/21/2014 0420   CL 105 05/20/2012 0823   CO2 27 10/13/2014 0806   CO2 29 01/21/2014 0420   GLUCOSE 115 10/13/2014 0806   GLUCOSE 129* 01/21/2014 0420   GLUCOSE 70 05/20/2012 0823   BUN 18.9 10/13/2014 0806   BUN 18 01/21/2014 0420   CREATININE 1.2* 10/13/2014 0806   CREATININE 1.05 01/21/2014 0420   CALCIUM 9.6 10/13/2014 0806   CALCIUM 8.7 01/21/2014 0420   PROT 5.7* 10/13/2014 0806   PROT 6.5 03/27/2014 0822   ALBUMIN 3.7 10/13/2014 0806   ALBUMIN 4.0 03/27/2014 0822   AST 21 10/13/2014 0806   AST 22 03/27/2014 0822   ALT 18 10/13/2014 0806   ALT 12 03/27/2014 0822   ALKPHOS 62 10/13/2014 0806   ALKPHOS 69 03/27/2014 0822   BILITOT 0.61 10/13/2014 0806   BILITOT 0.6 03/27/2014 0822   GFRNONAA 51* 01/21/2014 0420   GFRAA 60* 01/21/2014 0420    No results found for: SPEP, UPEP  Lab Results  Component Value Date   WBC 17.9* 10/30/2014   NEUTROABS 2.3 10/30/2014   HGB 13.0 10/30/2014   HCT 39.4 10/30/2014   MCV 95.9 10/30/2014   PLT 41* 10/30/2014      Chemistry      Component Value Date/Time   NA 138 10/13/2014 0806   NA 135 01/21/2014 0420   K 3.9 10/13/2014 0806   K 3.7 01/21/2014 0420   CL 95* 01/21/2014 0420   CL 105 05/20/2012 0823   CO2 27 10/13/2014 0806   CO2 29 01/21/2014 0420   BUN 18.9 10/13/2014 0806   BUN 18 01/21/2014 0420   CREATININE 1.2* 10/13/2014 0806   CREATININE 1.05 01/21/2014 0420      Component Value Date/Time   CALCIUM 9.6 10/13/2014 0806   CALCIUM 8.7 01/21/2014 0420   ALKPHOS 62 10/13/2014 0806   ALKPHOS 69 03/27/2014 0822   AST 21 10/13/2014 0806   AST 22 03/27/2014 0822   ALT 18 10/13/2014 0806   ALT 12 03/27/2014 0822   BILITOT 0.61 10/13/2014 0806   BILITOT 0.6 03/27/2014 0822     ASSESSMENT & PLAN:  CLL (chronic lymphocytic leukemia)  Unfortunately, she stopped responding to low-dose prednisone.  she could not tolerate with some side effects of prednisone.  I recommend definitive  therapy with Ibrutinib.  the risks, benefit, side effects of Ibrutinib were fully discussed with the patient and her significant other and she agreed to proceed. I will proceed to taper her off prednisone and start Irbutinib as soon as possible. I will bring her in for weekly blood draw after start of treatment and reassess within the month.  Essential hypertension  Her blood pressure is chronically elevated. According to the patient, she attributed that to anxiety. I recommend she recorded her blood pressure reading over the next 2 weeks and if the systolic blood pressure is greater than 150, I recommend she call her cardiologist's office for direction about medication adjustment.   No orders of the defined types were placed in this encounter.   All questions were answered. The patient knows to call the clinic with any problems, questions or concerns. No barriers to learning was detected. I spent  20 minutes counseling the patient face to face. The total time spent in the appointment was 25 minutes and more than 50% was on counseling and review of test results     [facility], [place], MD 10/30/2014 9:41 AM

## 2014-10-30 NOTE — Assessment & Plan Note (Signed)
Her blood pressure is chronically elevated. According to the patient, she attributed that to anxiety. I recommend she recorded her blood pressure reading over the next 2 weeks and if the systolic blood pressure is greater than 150, I recommend she call her cardiologist's office for direction about medication adjustment.

## 2014-10-30 NOTE — Telephone Encounter (Signed)
Rx for Imbruvica sent to [facility]

## 2014-11-02 NOTE — Telephone Encounter (Signed)
FYI Patient called asking if "Dr. Alvy Bimler received forms from the [place].  Today I received application for co-pay assistance program from the [place] that requires physician certification, my insurance information and more."   Learned from Oral chemotherapy Pharmacy manager Chris, [place] is trying to help patient apply for a grant through the [place]. Advised she bring form to front registration desk in main lobby.  She will bring tomorrow.

## 2014-11-03 ENCOUNTER — Encounter: Payer: Self-pay | Admitting: Pharmacist

## 2014-11-03 DIAGNOSIS — Z5111 Encounter for antineoplastic chemotherapy: Secondary | ICD-10-CM | POA: Insufficient documentation

## 2014-11-03 NOTE — Progress Notes (Signed)
Oral Chemotherapy Pharmacist Encounter   I spoke with patient for overview of new oral chemotherapy medication: Imbruvica for CLL. Pt is doing well. The prescription has been sent to the [facility] and patient picked up today. She qualified for a [place]. She will start the [place] on 10/29 (tomorrow).    Counseled patient on administration, dosing, side effects, safe handling, and monitoring. Side effects include but not limited to: fatigue, Nausea, vomiting, diarrhea, hypertension, edema, rash, and bruising.  Ms. Arizmendi voiced understanding and appreciation.   All questions answered.   Will follow up in 1 week for adherence and toxicity management.   Thank you,  Montel Clock, PharmD, [facility]

## 2014-11-03 NOTE — Telephone Encounter (Signed)
11/02/14 - Pt approved for copay assistance grant through [employer] for $5000. No further documentation needed. Called patient and made her aware. She was very Patent attorney. [facility] pharmacy to call patient when medication ready for pick up.   Thank you,  Montel Clock, PharmD, [facility]

## 2014-11-06 ENCOUNTER — Other Ambulatory Visit: Payer: Self-pay | Admitting: Hematology and Oncology

## 2014-11-06 NOTE — Progress Notes (Signed)
Thanks, placed order for labs monitoring and return

## 2014-11-07 ENCOUNTER — Telehealth: Payer: Self-pay | Admitting: Hematology and Oncology

## 2014-11-07 NOTE — Telephone Encounter (Signed)
pt called to r/s labs to tusdays....pt ok and aware

## 2014-11-07 NOTE — Telephone Encounter (Signed)
lvm for pt regarding to NOV appts... °

## 2014-11-10 ENCOUNTER — Telehealth: Payer: Self-pay | Admitting: Pharmacist

## 2014-11-10 NOTE — Telephone Encounter (Signed)
Oral Chemotherapy Follow-Up Form  Original Start date of oral chemotherapy: _10/29/2016__   Called patient today to follow up regarding patient's oral chemotherapy medication: _Imbruvica___  Pt is doing well today. Ms. Speece reports no side effects. Her energy levels are good and her appetite is good as well. No missed doses reported. Ms. Mctighe has a lab appointment on Tuesday 11/8  Pt reports __0__ tablets/doses missed in the last week/month.    Pt reports the following side effects: __None_________    Will follow up and call patient again in _1-2 weeks___   Thank you,  Montel Clock, PharmD, [facility]

## 2014-11-13 ENCOUNTER — Other Ambulatory Visit: Payer: Medicare Other

## 2014-11-14 ENCOUNTER — Other Ambulatory Visit (HOSPITAL_BASED_OUTPATIENT_CLINIC_OR_DEPARTMENT_OTHER): Payer: Medicare Other

## 2014-11-14 ENCOUNTER — Telehealth: Payer: Self-pay | Admitting: *Deleted

## 2014-11-14 DIAGNOSIS — C9110 Chronic lymphocytic leukemia of B-cell type not having achieved remission: Secondary | ICD-10-CM

## 2014-11-14 LAB — TECHNOLOGIST REVIEW

## 2014-11-14 LAB — CBC WITH DIFFERENTIAL/PLATELET
BASO%: 0.3 % (ref 0.0–2.0)
BASOS ABS: 0.1 10*3/uL (ref 0.0–0.1)
EOS ABS: 0.1 10*3/uL (ref 0.0–0.5)
EOS%: 0.3 % (ref 0.0–7.0)
HEMATOCRIT: 35.3 % (ref 34.8–46.6)
HEMOGLOBIN: 11.4 g/dL — AB (ref 11.6–15.9)
LYMPH#: 21.3 10*3/uL — AB (ref 0.9–3.3)
LYMPH%: 87.1 % — ABNORMAL HIGH (ref 14.0–49.7)
MCH: 31.2 pg (ref 25.1–34.0)
MCHC: 32.3 g/dL (ref 31.5–36.0)
MCV: 96.7 fL (ref 79.5–101.0)
MONO#: 0.4 10*3/uL (ref 0.1–0.9)
MONO%: 1.7 % (ref 0.0–14.0)
NEUT#: 2.6 10*3/uL (ref 1.5–6.5)
NEUT%: 10.6 % — ABNORMAL LOW (ref 38.4–76.8)
PLATELETS: 41 10*3/uL — AB (ref 145–400)
RBC: 3.65 10*6/uL — ABNORMAL LOW (ref 3.70–5.45)
RDW: 15.5 % — AB (ref 11.2–14.5)
WBC: 24.4 10*3/uL — ABNORMAL HIGH (ref 3.9–10.3)

## 2014-11-14 NOTE — Telephone Encounter (Signed)
-----   Message from Heath Lark, MD sent at 11/14/2014  8:50 AM EST ----- Regarding: cbc Pls let her know labs are stable, continue Ibrutinib ----- Message -----    From: [facility] Interface    Sent: 11/14/2014   8:18 AM      To: Heath Lark, MD

## 2014-11-14 NOTE — Telephone Encounter (Signed)
Pt notified of message below.

## 2014-11-20 ENCOUNTER — Other Ambulatory Visit: Payer: Medicare Other

## 2014-11-21 ENCOUNTER — Other Ambulatory Visit (HOSPITAL_BASED_OUTPATIENT_CLINIC_OR_DEPARTMENT_OTHER): Payer: Medicare Other

## 2014-11-21 ENCOUNTER — Telehealth: Payer: Self-pay | Admitting: *Deleted

## 2014-11-21 DIAGNOSIS — C9110 Chronic lymphocytic leukemia of B-cell type not having achieved remission: Secondary | ICD-10-CM | POA: Diagnosis not present

## 2014-11-21 LAB — CBC WITH DIFFERENTIAL/PLATELET
BASO%: 0.2 % (ref 0.0–2.0)
Basophils Absolute: 0 10*3/uL (ref 0.0–0.1)
EOS ABS: 0 10*3/uL (ref 0.0–0.5)
EOS%: 0.3 % (ref 0.0–7.0)
HEMATOCRIT: 34 % — AB (ref 34.8–46.6)
HGB: 11.4 g/dL — ABNORMAL LOW (ref 11.6–15.9)
LYMPH#: 9.1 10*3/uL — AB (ref 0.9–3.3)
LYMPH%: 75.3 % — ABNORMAL HIGH (ref 14.0–49.7)
MCH: 32.1 pg (ref 25.1–34.0)
MCHC: 33.5 g/dL (ref 31.5–36.0)
MCV: 95.8 fL (ref 79.5–101.0)
MONO#: 0.1 10*3/uL (ref 0.1–0.9)
MONO%: 0.8 % (ref 0.0–14.0)
NEUT%: 23.4 % — AB (ref 38.4–76.8)
NEUTROS ABS: 2.8 10*3/uL (ref 1.5–6.5)
PLATELETS: 41 10*3/uL — AB (ref 145–400)
RBC: 3.55 10*6/uL — ABNORMAL LOW (ref 3.70–5.45)
RDW: 13.7 % (ref 11.2–14.5)
WBC: 12.1 10*3/uL — AB (ref 3.9–10.3)

## 2014-11-21 NOTE — Telephone Encounter (Signed)
-----   Message from Heath Lark, MD sent at 11/21/2014  8:36 AM EST ----- Regarding: cbc Pls let her know labs are stable. Continue the same ----- Message -----    From: [facility] Interface    Sent: 11/21/2014   8:24 AM      To: Heath Lark, MD

## 2014-11-21 NOTE — Telephone Encounter (Signed)
Informed pt of Dr. Calton Dach message below.  Keep lab next week as scheduled.  She verbalized understanding.

## 2014-11-27 ENCOUNTER — Other Ambulatory Visit: Payer: Medicare Other

## 2014-11-28 ENCOUNTER — Other Ambulatory Visit (HOSPITAL_BASED_OUTPATIENT_CLINIC_OR_DEPARTMENT_OTHER): Payer: Medicare Other

## 2014-11-28 ENCOUNTER — Telehealth: Payer: Self-pay | Admitting: *Deleted

## 2014-11-28 DIAGNOSIS — C9110 Chronic lymphocytic leukemia of B-cell type not having achieved remission: Secondary | ICD-10-CM | POA: Diagnosis not present

## 2014-11-28 LAB — CBC WITH DIFFERENTIAL/PLATELET
BASO%: 1 % (ref 0.0–2.0)
Basophils Absolute: 0.1 10*3/uL (ref 0.0–0.1)
EOS ABS: 0.1 10*3/uL (ref 0.0–0.5)
EOS%: 0.7 % (ref 0.0–7.0)
HEMATOCRIT: 33.5 % — AB (ref 34.8–46.6)
HGB: 11 g/dL — ABNORMAL LOW (ref 11.6–15.9)
LYMPH#: 11.4 10*3/uL — AB (ref 0.9–3.3)
LYMPH%: 82.2 % — ABNORMAL HIGH (ref 14.0–49.7)
MCH: 31.3 pg (ref 25.1–34.0)
MCHC: 32.8 g/dL (ref 31.5–36.0)
MCV: 95.5 fL (ref 79.5–101.0)
MONO#: 0.2 10*3/uL (ref 0.1–0.9)
MONO%: 1.7 % (ref 0.0–14.0)
NEUT#: 2 10*3/uL (ref 1.5–6.5)
NEUT%: 14.4 % — AB (ref 38.4–76.8)
PLATELETS: 91 10*3/uL — AB (ref 145–400)
RBC: 3.51 10*6/uL — ABNORMAL LOW (ref 3.70–5.45)
RDW: 14.2 % (ref 11.2–14.5)
WBC: 13.9 10*3/uL — ABNORMAL HIGH (ref 3.9–10.3)

## 2014-11-28 LAB — TECHNOLOGIST REVIEW

## 2014-11-28 NOTE — Telephone Encounter (Signed)
-----   Message from Heath Lark, MD sent at 11/28/2014  8:29 AM EST ----- Regarding: cbc Pls let her know platelet count is very good, continue the same ----- Message -----    From: [facility] Interface    Sent: 11/28/2014   8:14 AM      To: Heath Lark, MD

## 2014-11-28 NOTE — Telephone Encounter (Signed)
Received call from pt stating that she has not heard from anyone about her labs & would like report.  Informed that labs looked good per Dr Alvy Bimler & she was given the #'s.  She expressed appreciation.

## 2014-12-01 ENCOUNTER — Telehealth: Payer: Self-pay | Admitting: *Deleted

## 2014-12-01 NOTE — Telephone Encounter (Signed)
Notified of results. Did not leave message on unidentified phone on 11/28/14

## 2014-12-04 ENCOUNTER — Encounter: Payer: Self-pay | Admitting: Pharmacist

## 2014-12-04 NOTE — Progress Notes (Signed)
Oral Chemotherapy Follow-Up Form  Original Start date of oral chemotherapy: _10/29/2016______   Called patient today to follow up regarding patient's oral chemotherapy medication: _Imbruvica___  Pt is doing well today with no complaints. Her platelet count is stable. Pt has follow up visit with Dr. Alvy Bimler tomorrow and she will pick up a refill of Imbruvica pending no lab issues on 11/29. Pt finished her Imbruvica yesterday on 11/27. I instructed patient that she should be calling the pharmacy a few days to a week prior to running out of her medication so that she does not miss a day in between. Pt voiced understanding. [facility] will order her [place] today and patient can pick up tomorrow on 11/29 when she is here for her follow up visit as long as labs are stable.   Pt reports __1__ tablets/doses missed in the last week.  Missed dose(s) attributed to: _Took last tablet on 11/27. Plans to get refill on 11/29 when in the office._  Pt reports the following side effects: __no side effects_________    Will follow up and call patient again in _1 month____   Thank you,  Montel Clock, PharmD, [facility]

## 2014-12-05 ENCOUNTER — Encounter: Payer: Self-pay | Admitting: Hematology and Oncology

## 2014-12-05 ENCOUNTER — Telehealth: Payer: Self-pay | Admitting: Hematology and Oncology

## 2014-12-05 ENCOUNTER — Ambulatory Visit (HOSPITAL_BASED_OUTPATIENT_CLINIC_OR_DEPARTMENT_OTHER): Payer: Medicare Other | Admitting: Hematology and Oncology

## 2014-12-05 ENCOUNTER — Other Ambulatory Visit (HOSPITAL_BASED_OUTPATIENT_CLINIC_OR_DEPARTMENT_OTHER): Payer: Medicare Other

## 2014-12-05 VITALS — BP 139/41 | HR 71 | Temp 98.0°F | Resp 17 | Ht 60.0 in | Wt 104.5 lb

## 2014-12-05 DIAGNOSIS — C9110 Chronic lymphocytic leukemia of B-cell type not having achieved remission: Secondary | ICD-10-CM

## 2014-12-05 DIAGNOSIS — D696 Thrombocytopenia, unspecified: Secondary | ICD-10-CM | POA: Diagnosis not present

## 2014-12-05 DIAGNOSIS — D630 Anemia in neoplastic disease: Secondary | ICD-10-CM | POA: Diagnosis not present

## 2014-12-05 LAB — CBC WITH DIFFERENTIAL/PLATELET
BASO%: 1 % (ref 0.0–2.0)
Basophils Absolute: 0.1 10*3/uL (ref 0.0–0.1)
EOS ABS: 0.1 10*3/uL (ref 0.0–0.5)
EOS%: 0.5 % (ref 0.0–7.0)
HEMATOCRIT: 34.1 % — AB (ref 34.8–46.6)
HEMOGLOBIN: 11 g/dL — AB (ref 11.6–15.9)
LYMPH#: 10.8 10*3/uL — AB (ref 0.9–3.3)
LYMPH%: 82.1 % — ABNORMAL HIGH (ref 14.0–49.7)
MCH: 30.8 pg (ref 25.1–34.0)
MCHC: 32.2 g/dL (ref 31.5–36.0)
MCV: 95.8 fL (ref 79.5–101.0)
MONO#: 0.2 10*3/uL (ref 0.1–0.9)
MONO%: 1.3 % (ref 0.0–14.0)
NEUT%: 15.1 % — ABNORMAL LOW (ref 38.4–76.8)
NEUTROS ABS: 2 10*3/uL (ref 1.5–6.5)
Platelets: 124 10*3/uL — ABNORMAL LOW (ref 145–400)
RBC: 3.56 10*6/uL — ABNORMAL LOW (ref 3.70–5.45)
RDW: 14.1 % (ref 11.2–14.5)
WBC: 13.2 10*3/uL — AB (ref 3.9–10.3)

## 2014-12-05 NOTE — Progress Notes (Signed)
[facility] OFFICE PROGRESS NOTE  Patient Care Team: Kristie Cowman, MD as PCP - General (Family Medicine) Kristie Cowman, MD (Family Medicine) Terance Ice, MD (Cardiology) Sherren Mocha, MD as Consulting Physician (Cardiology)  SUMMARY OF ONCOLOGIC HISTORY:   CLL (chronic lymphocytic leukemia) ([place])   03/21/2011 Initial Diagnosis CLL (chronic lymphocytic leukemia)   01/14/2014 - 01/31/2014 Hospital Admission The patient was hospitalized and was found to severe aortic stenosis causing syncopal episode. She received treatment with steroids with minimum success and required platelet transfusion.   09/18/2014 Imaging CT scan of the chest, abdomen and pelvis show lymphadenopathy but low level burden of disease.   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 There was no stenosis. Mild peri-valvular aortic insufficiency is noted   11/04/2014 -  Chemotherapy She is started on Ibrutinib    INTERVAL HISTORY: Please see below for problem oriented charting. She feels well upon from minor bruising. The patient denies any recent signs or symptoms of bleeding such as spontaneous epistaxis, hematuria or hematochezia. Denies any diarrhea or irregular heartbeat.  REVIEW OF SYSTEMS:   Constitutional: Denies fevers, chills or abnormal weight loss Eyes: Denies blurriness of vision Ears, nose, mouth, throat, and face: Denies mucositis or sore throat Respiratory: Denies cough, dyspnea or wheezes Cardiovascular: Denies palpitation, chest discomfort or lower extremity swelling Gastrointestinal:  Denies nausea, heartburn or change in bowel habits Skin: Denies abnormal skin rashes Lymphatics: Denies new lymphadenopathy  Neurological:Denies numbness, tingling or new weaknesses Behavioral/Psych: Mood is stable, no new changes  All other systems were reviewed with the patient and are  negative.  I have reviewed the past medical history, past surgical history, social history and family history with the patient and they are unchanged from previous note.  ALLERGIES:  has No Known Allergies.  MEDICATIONS:  Current Outpatient Prescriptions  Medication Sig Dispense Refill  . acetaminophen (TYLENOL) 500 MG tablet Take 1,000 mg by mouth every 6 (six) hours as needed. pain     . amLODipine (NORVASC) 5 MG tablet Take 1 tablet (5 mg total) by mouth daily. 90 tablet 3  . amoxicillin (AMOXIL) 500 MG tablet Take 4 tablets by mouth one hour prior to dental procedure 8 tablet 2  . atorvastatin (LIPITOR) 10 MG tablet Take 1 tablet (10 mg total) by mouth daily at 6 PM. 90 tablet 3  . ibrutinib (IMBRUVICA) 140 MG capsul Take 3 capsules (420 mg total) by mouth daily. 90 capsule 6  . lisinopril (PRINIVIL,ZESTRIL) 20 MG tablet Take 1 tablet (20 mg total) by mouth daily. 90 tablet 3  . metoprolol succinate (TOPROL-XL) 50 MG 24 hr tablet Take 1 tablet (50 mg total) by mouth daily. Take with or immediately following a meal. 90 tablet 3  . predniSONE (DELTASONE) 10 MG tablet Take 1 tablet (10 mg total) by mouth daily with breakfast. 30 tablet 0   No current facility-administered medications for this visit.    PHYSICAL EXAMINATION: ECOG PERFORMANCE STATUS: 0 - Asymptomatic  Filed Vitals:   12/05/14 0819  BP: 139/41  Pulse: 71  Temp: 98 F (36.7 C)  Resp: 17   Filed Weights   12/05/14 0819  Weight: 104 lb 8 oz (47.401 kg)    GENERAL:alert, no distress and comfortable SKIN: skin color, texture, turgor are normal, no rashes or significant lesions. Noted skin bruising EYES: normal, Conjunctiva are pink and non-injected, sclera clear Musculoskeletal:no cyanosis of digits and no  clubbing  NEURO: alert & oriented x 3 with fluent speech, no focal motor/sensory deficits  LABORATORY DATA:  I have reviewed the data as listed    Component Value Date/Time   NA 138 10/13/2014 0806   NA 135  01/21/2014 0420   K 3.9 10/13/2014 0806   K 3.7 01/21/2014 0420   CL 95* 01/21/2014 0420   CL 105 05/20/2012 0823   CO2 27 10/13/2014 0806   CO2 29 01/21/2014 0420   GLUCOSE 115 10/13/2014 0806   GLUCOSE 129* 01/21/2014 0420   GLUCOSE 70 05/20/2012 0823   BUN 18.9 10/13/2014 0806   BUN 18 01/21/2014 0420   CREATININE 1.2* 10/13/2014 0806   CREATININE 1.05 01/21/2014 0420   CALCIUM 9.6 10/13/2014 0806   CALCIUM 8.7 01/21/2014 0420   PROT 5.7* 10/13/2014 0806   PROT 6.5 03/27/2014 0822   ALBUMIN 3.7 10/13/2014 0806   ALBUMIN 4.0 03/27/2014 0822   AST 21 10/13/2014 0806   AST 22 03/27/2014 0822   ALT 18 10/13/2014 0806   ALT 12 03/27/2014 0822   ALKPHOS 62 10/13/2014 0806   ALKPHOS 69 03/27/2014 0822   BILITOT 0.61 10/13/2014 0806   BILITOT 0.6 03/27/2014 0822   GFRNONAA 51* 01/21/2014 0420   GFRAA 60* 01/21/2014 0420    No results found for: SPEP, UPEP  Lab Results  Component Value Date   WBC 13.2* 12/05/2014   NEUTROABS 2.0 12/05/2014   HGB 11.0* 12/05/2014   HCT 34.1* 12/05/2014   MCV 95.8 12/05/2014   PLT 124* 12/05/2014      Chemistry      Component Value Date/Time   NA 138 10/13/2014 0806   NA 135 01/21/2014 0420   K 3.9 10/13/2014 0806   K 3.7 01/21/2014 0420   CL 95* 01/21/2014 0420   CL 105 05/20/2012 0823   CO2 27 10/13/2014 0806   CO2 29 01/21/2014 0420   BUN 18.9 10/13/2014 0806   BUN 18 01/21/2014 0420   CREATININE 1.2* 10/13/2014 0806   CREATININE 1.05 01/21/2014 0420      Component Value Date/Time   CALCIUM 9.6 10/13/2014 0806   CALCIUM 8.7 01/21/2014 0420   ALKPHOS 62 10/13/2014 0806   ALKPHOS 69 03/27/2014 0822   AST 21 10/13/2014 0806   AST 22 03/27/2014 0822   ALT 18 10/13/2014 0806   ALT 12 03/27/2014 0822   BILITOT 0.61 10/13/2014 0806   BILITOT 0.6 03/27/2014 0822     ASSESSMENT & PLAN:  CLL (chronic lymphocytic leukemia)  Unfortunately, she stopped responding to low-dose prednisone.  I recommend definitive therapy with  Ibrutinib. Apart from bruising, she has no side effects such as diarrhea or new onset of atrial fibrillation. We will continue treatment indefinitely. With stability of her blood count, I will see her back in 3 months.  Thrombocytopenia Her white count is improving on Ibrutinib With stability of her platelet count, I recommend she resume 81 mg aspirin for stroke prevention.    Anemia in neoplastic disease This is likely anemia of chronic disease, CLL or consumption/hemolysis. The patient denies recent history of bleeding such as epistaxis, hematuria or hematochezia. She is asymptomatic from the anemia. We will observe for now.      No orders of the defined types were placed in this encounter.   All questions were answered. The patient knows to call the clinic with any problems, questions or concerns. No barriers to learning was detected. I spent 15 minutes counseling the patient face to face.  The total time spent in the appointment was 20 minutes and more than 50% was on counseling and review of test results     [facility], [place], MD 12/05/2014 8:27 AM

## 2014-12-05 NOTE — Assessment & Plan Note (Signed)
Unfortunately, she stopped responding to low-dose prednisone.  I recommend definitive therapy with Ibrutinib. Apart from bruising, she has no side effects such as diarrhea or new onset of atrial fibrillation. We will continue treatment indefinitely. With stability of her blood count, I will see her back in 3 months.

## 2014-12-05 NOTE — Assessment & Plan Note (Signed)
Her white count is improving on Ibrutinib With stability of her platelet count, I recommend she resume 81 mg aspirin for stroke prevention.

## 2014-12-05 NOTE — Telephone Encounter (Signed)
Gave and printed appt sched and avs fo rpt for Feb 2017 °

## 2014-12-05 NOTE — Assessment & Plan Note (Signed)
This is likely anemia of chronic disease, CLL or consumption/hemolysis. The patient denies recent history of bleeding such as epistaxis, hematuria or hematochezia. She is asymptomatic from the anemia. We will observe for now.

## 2015-01-05 ENCOUNTER — Telehealth: Payer: Self-pay | Admitting: Pharmacist

## 2015-01-05 NOTE — Telephone Encounter (Signed)
01/05/15 - Called patient to follow up on oral medication Imbruvica. Unable to reach patient. Left VM for patient to call back with questions.   Thank you,  Montel Clock, PharmD, [facility]

## 2015-01-29 MED FILL — *IMBRUVICA 140 MG CAPSULE: 140 | 30 days supply | Qty: 90 | Fill #3

## 2015-03-01 MED FILL — *IMBRUVICA 140 MG CAPSULE: 140 | 30 days supply | Qty: 90 | Fill #4

## 2015-03-05 ENCOUNTER — Telehealth: Payer: Self-pay | Admitting: Hematology and Oncology

## 2015-03-05 ENCOUNTER — Other Ambulatory Visit (HOSPITAL_BASED_OUTPATIENT_CLINIC_OR_DEPARTMENT_OTHER): Payer: Medicare Other

## 2015-03-05 ENCOUNTER — Ambulatory Visit (HOSPITAL_BASED_OUTPATIENT_CLINIC_OR_DEPARTMENT_OTHER): Payer: Medicare Other | Admitting: Hematology and Oncology

## 2015-03-05 VITALS — BP 173/43 | HR 66 | Temp 97.7°F | Resp 18 | Ht 60.0 in | Wt 103.9 lb

## 2015-03-05 DIAGNOSIS — C9110 Chronic lymphocytic leukemia of B-cell type not having achieved remission: Secondary | ICD-10-CM | POA: Diagnosis not present

## 2015-03-05 DIAGNOSIS — D696 Thrombocytopenia, unspecified: Secondary | ICD-10-CM | POA: Diagnosis not present

## 2015-03-05 LAB — CBC WITH DIFFERENTIAL/PLATELET
BASO%: 1.2 % (ref 0.0–2.0)
Basophils Absolute: 0.2 10*3/uL — ABNORMAL HIGH (ref 0.0–0.1)
EOS ABS: 0.1 10*3/uL (ref 0.0–0.5)
EOS%: 0.5 % (ref 0.0–7.0)
HCT: 38 % (ref 34.8–46.6)
HGB: 12.4 g/dL (ref 11.6–15.9)
LYMPH%: 85.7 % — AB (ref 14.0–49.7)
MCH: 30.2 pg (ref 25.1–34.0)
MCHC: 32.6 g/dL (ref 31.5–36.0)
MCV: 92.7 fL (ref 79.5–101.0)
MONO#: 0.4 10*3/uL (ref 0.1–0.9)
MONO%: 2.7 % (ref 0.0–14.0)
NEUT#: 1.5 10*3/uL (ref 1.5–6.5)
NEUT%: 9.9 % — AB (ref 38.4–76.8)
PLATELETS: 87 10*3/uL — AB (ref 145–400)
RBC: 4.11 10*6/uL (ref 3.70–5.45)
RDW: 14.3 % (ref 11.2–14.5)
WBC: 15.6 10*3/uL — ABNORMAL HIGH (ref 3.9–10.3)
lymph#: 13.4 10*3/uL — ABNORMAL HIGH (ref 0.9–3.3)

## 2015-03-05 LAB — TECHNOLOGIST REVIEW

## 2015-03-05 NOTE — Telephone Encounter (Signed)
appt made and avs printed °

## 2015-03-06 ENCOUNTER — Encounter: Payer: Self-pay | Admitting: Hematology and Oncology

## 2015-03-06 NOTE — Progress Notes (Signed)
[facility] OFFICE PROGRESS NOTE  Patient Care Team: Kristie Cowman, MD as PCP - General (Family Medicine) Kristie Cowman, MD (Family Medicine) Terance Ice, MD (Cardiology) Sherren Mocha, MD as Consulting Physician (Cardiology)  SUMMARY OF ONCOLOGIC HISTORY:   CLL (chronic lymphocytic leukemia) ([place])   03/21/2011 Initial Diagnosis CLL (chronic lymphocytic leukemia)   01/14/2014 - 01/31/2014 Hospital Admission The patient was hospitalized and was found to severe aortic stenosis causing syncopal episode. She received treatment with steroids with minimum success and required platelet transfusion.   09/18/2014 Imaging CT scan of the chest, abdomen and pelvis show lymphadenopathy but low level burden of disease.   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 There was no stenosis. Mild peri-valvular aortic insufficiency is noted   11/04/2014 -  Chemotherapy She is started on Ibrutinib    INTERVAL HISTORY: Please see below for problem oriented charting. She returns for further follow-up. She feels well. Apart from minor bruising, she denies side effects of treatment. She denies diarrhea or irregular heartbeat  REVIEW OF SYSTEMS:   Constitutional: Denies fevers, chills or abnormal weight loss Eyes: Denies blurriness of vision Ears, nose, mouth, throat, and face: Denies mucositis or sore throat Respiratory: Denies cough, dyspnea or wheezes Cardiovascular: Denies palpitation, chest discomfort or lower extremity swelling Gastrointestinal:  Denies nausea, heartburn or change in bowel habits Skin: Denies abnormal skin rashes Lymphatics: Denies new lymphadenopathy  Neurological:Denies numbness, tingling or new weaknesses Behavioral/Psych: Mood is stable, no new changes  All other systems were reviewed with the patient and are negative.  I have reviewed the past medical  history, past surgical history, social history and family history with the patient and they are unchanged from previous note.  ALLERGIES:  has No Known Allergies.  MEDICATIONS:  Current Outpatient Prescriptions  Medication Sig Dispense Refill  . acetaminophen (TYLENOL) 325 MG tablet Take 650 mg by mouth every 6 (six) hours as needed.    [place] Kitchen amLODipine (NORVASC) 5 MG tablet Take 1 tablet (5 mg total) by mouth daily. 90 tablet 3  . amoxicillin (AMOXIL) 500 MG tablet Take 4 tablets by mouth one hour prior to dental procedure 8 tablet 2  . atorvastatin (LIPITOR) 10 MG tablet Take 1 tablet (10 mg total) by mouth daily at 6 PM. 90 tablet 3  . ibrutinib (IMBRUVICA) 140 MG capsul Take 3 capsules (420 mg total) by mouth daily. 90 capsule 6  . lisinopril (PRINIVIL,ZESTRIL) 20 MG tablet Take 1 tablet (20 mg total) by mouth daily. 90 tablet 3  . metoprolol succinate (TOPROL-XL) 50 MG 24 hr tablet Take 1 tablet (50 mg total) by mouth daily. Take with or immediately following a meal. 90 tablet 3   No current facility-administered medications for this visit.    PHYSICAL EXAMINATION: ECOG PERFORMANCE STATUS: 1 - Symptomatic but completely ambulatory  Filed Vitals:   03/05/15 0812  BP: 173/43  Pulse: 66  Temp: 97.7 F (36.5 C)  Resp: 18   Filed Weights   03/05/15 0812  Weight: 103 lb 14.4 oz (47.129 kg)    GENERAL:alert, no distress and comfortable SKIN: skin color, texture, turgor are normal, no rashes or significant lesions EYES: normal, Conjunctiva are pink and non-injected, sclera clear OROPHARYNX:no exudate, no erythema and lips, buccal mucosa, and tongue normal  NECK: supple, thyroid normal size, non-tender, without nodularity LYMPH:  no palpable lymphadenopathy in the cervical, axillary or inguinal LUNGS: clear to auscultation  and percussion with normal breathing effort HEART: regular rate & rhythm and no murmurs and no lower extremity edema ABDOMEN:abdomen soft, non-tender and normal  bowel sounds Musculoskeletal:no cyanosis of digits and no clubbing  NEURO: alert & oriented x 3 with fluent speech, no focal motor/sensory deficits  LABORATORY DATA:  I have reviewed the data as listed    Component Value Date/Time   NA 138 10/13/2014 0806   NA 135 01/21/2014 0420   K 3.9 10/13/2014 0806   K 3.7 01/21/2014 0420   CL 95* 01/21/2014 0420   CL 105 05/20/2012 0823   CO2 27 10/13/2014 0806   CO2 29 01/21/2014 0420   GLUCOSE 115 10/13/2014 0806   GLUCOSE 129* 01/21/2014 0420   GLUCOSE 70 05/20/2012 0823   BUN 18.9 10/13/2014 0806   BUN 18 01/21/2014 0420   CREATININE 1.2* 10/13/2014 0806   CREATININE 1.05 01/21/2014 0420   CALCIUM 9.6 10/13/2014 0806   CALCIUM 8.7 01/21/2014 0420   PROT 5.7* 10/13/2014 0806   PROT 6.5 03/27/2014 0822   ALBUMIN 3.7 10/13/2014 0806   ALBUMIN 4.0 03/27/2014 0822   AST 21 10/13/2014 0806   AST 22 03/27/2014 0822   ALT 18 10/13/2014 0806   ALT 12 03/27/2014 0822   ALKPHOS 62 10/13/2014 0806   ALKPHOS 69 03/27/2014 0822   BILITOT 0.61 10/13/2014 0806   BILITOT 0.6 03/27/2014 0822   GFRNONAA 51* 01/21/2014 0420   GFRAA 60* 01/21/2014 0420    No results found for: SPEP, UPEP  Lab Results  Component Value Date   WBC 15.6* 03/05/2015   NEUTROABS 1.5 03/05/2015   HGB 12.4 03/05/2015   HCT 38.0 03/05/2015   MCV 92.7 03/05/2015   PLT 87* 03/05/2015      Chemistry      Component Value Date/Time   NA 138 10/13/2014 0806   NA 135 01/21/2014 0420   K 3.9 10/13/2014 0806   K 3.7 01/21/2014 0420   CL 95* 01/21/2014 0420   CL 105 05/20/2012 0823   CO2 27 10/13/2014 0806   CO2 29 01/21/2014 0420   BUN 18.9 10/13/2014 0806   BUN 18 01/21/2014 0420   CREATININE 1.2* 10/13/2014 0806   CREATININE 1.05 01/21/2014 0420      Component Value Date/Time   CALCIUM 9.6 10/13/2014 0806   CALCIUM 8.7 01/21/2014 0420   ALKPHOS 62 10/13/2014 0806   ALKPHOS 69 03/27/2014 0822   AST 21 10/13/2014 0806   AST 22 03/27/2014 0822   ALT 18  10/13/2014 0806   ALT 12 03/27/2014 0822   BILITOT 0.61 10/13/2014 0806   BILITOT 0.6 03/27/2014 0822      ASSESSMENT & PLAN:  CLL (chronic lymphocytic leukemia) She is doing well. Apart from bruising, she has no side effects such as diarrhea or new onset of atrial fibrillation. We will continue treatment indefinitely. With stability of her blood count, I will see her back in 3 months. The mild ymphocytosis seen is a well-known effect from the treatment  Thrombocytopenia Her white count is improving on Ibrutinib With stability of her platelet count, I recommend she resume 81 mg aspirin for stroke prevention.     Orders Placed This Encounter  Procedures  . Comprehensive metabolic panel    Standing Status: Future     Number of Occurrences:      Standing Expiration Date: 04/08/2016   All questions were answered. The patient knows to call the clinic with any problems, questions or concerns. No barriers to  learning was detected. I spent 15 minutes counseling the patient face to face. The total time spent in the appointment was 20 minutes and more than 50% was on counseling and review of test results     [facility], Chany Woolworth, MD 03/06/2015 9:22 AM

## 2015-03-06 NOTE — Assessment & Plan Note (Signed)
Her white count is improving on Ibrutinib With stability of her platelet count, I recommend she resume 81 mg aspirin for stroke prevention.

## 2015-03-06 NOTE — Assessment & Plan Note (Signed)
She is doing well. Apart from bruising, she has no side effects such as diarrhea or new onset of atrial fibrillation. We will continue treatment indefinitely. With stability of her blood count, I will see her back in 3 months. The mild ymphocytosis seen is a well-known effect from the treatment

## 2015-03-19 ENCOUNTER — Other Ambulatory Visit: Payer: Self-pay | Admitting: *Deleted

## 2015-03-19 ENCOUNTER — Telehealth: Payer: Self-pay | Admitting: Cardiovascular Disease

## 2015-03-19 DIAGNOSIS — E785 Hyperlipidemia, unspecified: Secondary | ICD-10-CM

## 2015-03-19 DIAGNOSIS — I10 Essential (primary) hypertension: Secondary | ICD-10-CM

## 2015-03-19 MED ORDER — AMLODIPINE BESYLATE 5 MG PO TABS: 5.0000 mg | ORAL_TABLET | Freq: Every day | ORAL | Status: DC

## 2015-03-19 MED ORDER — LISINOPRIL 20 MG PO TABS
20.0000 mg | ORAL_TABLET | Freq: Every day | ORAL | Status: DC
Start: 2015-03-19 — End: 2015-09-11

## 2015-03-19 MED ORDER — ATORVASTATIN CALCIUM 10 MG PO TABS
10.0000 mg | ORAL_TABLET | Freq: Every day | ORAL | Status: DC
Start: 2015-03-19 — End: 2015-07-02

## 2015-03-19 MED ORDER — METOPROLOL SUCCINATE ER 50 MG PO TB24: 50.0000 mg | ORAL_TABLET | Freq: Every day | ORAL | Status: DC

## 2015-03-19 NOTE — Telephone Encounter (Signed)
°*  STAT* If patient is at the pharmacy, call can be transferred to refill team.   1. Which medications need to be refilled? (please list name of each medication and dose if known) Lisinopril 20 mg,Metoprolol 50 mg,Atorvastatin 10 mg and Amlodipine 5 mg-need new prescriptions 2. Which pharmacy/location (including street and city if local pharmacy) is medication to be sent to?[facility] [phone]. Do they need a 30 day or 90 day supply? 60 and refills

## 2015-03-20 ENCOUNTER — Encounter: Payer: Self-pay | Admitting: Pharmacist

## 2015-03-20 NOTE — Progress Notes (Signed)
Oral Chemotherapy Follow-Up Form  Original Start date of oral chemotherapy: _10/29/2016___   Called patient today to follow up regarding patient's oral chemotherapy medication: _Imbruvica___  Pt is doing well today with no complaints. She states she is feeling better and eating better. No missed doses of Imbruvica and patient is still taking 3 capsules once daily  Pt reports _0_ tablets/doses missed in the last month.    Pt reports the following side effects: __no side effects to report___   Will follow up and call patient again in _1 month____   Thank you,  Montel Clock, PharmD, [facility]

## 2015-04-03 MED FILL — *IMBRUVICA 140 MG CAPSULE: 140 | 30 days supply | Qty: 90 | Fill #5

## 2015-04-09 ENCOUNTER — Other Ambulatory Visit: Payer: Self-pay | Admitting: Physician Assistant

## 2015-04-11 ENCOUNTER — Other Ambulatory Visit: Payer: Self-pay | Admitting: *Deleted

## 2015-04-11 DIAGNOSIS — Z952 Presence of prosthetic heart valve: Secondary | ICD-10-CM

## 2015-04-11 MED ORDER — AMOXICILLIN 500 MG PO TABS: ORAL_TABLET | ORAL | Status: DC

## 2015-04-17 ENCOUNTER — Encounter: Payer: Self-pay | Admitting: Pharmacist

## 2015-04-17 NOTE — Progress Notes (Signed)
Oral Chemotherapy Follow-Up Form  Original Start date of oral chemotherapy: _10/29/17_   Called patient today to follow up regarding patient's oral chemotherapy medication: _Imbruvica__  Pt is doing well today. She has been on Imbruvica for almost 6 months. She does report some minor fatigue and petechiae spots that come and go but otherwise is very pleased with how she has done on the Imbruvica. No Missed doses. No issues to report. She receives the Imbruvica from [facility] and has some foundation support that makes her copay $0.   Pt reports __0__ tablets/doses missed in the last month.    Pt reports the following side effects: _fatigue, petechiae__   Will follow up and call patient again in _1 month____   Thank you,  Montel Clock, PharmD, [facility]

## 2015-04-25 ENCOUNTER — Other Ambulatory Visit: Payer: Medicare Other

## 2015-05-01 ENCOUNTER — Other Ambulatory Visit (INDEPENDENT_AMBULATORY_CARE_PROVIDER_SITE_OTHER): Payer: Medicare Other | Admitting: *Deleted

## 2015-05-01 DIAGNOSIS — I2510 Atherosclerotic heart disease of native coronary artery without angina pectoris: Secondary | ICD-10-CM | POA: Diagnosis not present

## 2015-05-01 DIAGNOSIS — I10 Essential (primary) hypertension: Secondary | ICD-10-CM | POA: Diagnosis not present

## 2015-05-01 LAB — LIPID PANEL
CHOL/HDL RATIO: 2 ratio (ref <=5.0)
CHOLESTEROL: 140 mg/dL (ref 125–200)
HDL: 69 mg/dL (ref >=46)
LDL Cholesterol: 48 mg/dL (ref <130)
TRIGLYCERIDES: 115 mg/dL (ref <150)
VLDL: 23 mg/dL (ref <30)

## 2015-05-01 LAB — HEPATIC FUNCTION PANEL
ALBUMIN: 3.8 g/dL (ref 3.6–5.1)
ALT: 13 U/L (ref 6–29)
AST: 26 U/L (ref 10–35)
Alkaline Phosphatase: 47 U/L (ref 33–130)
Bilirubin, Direct: 0.2 mg/dL (ref <=0.2)
Indirect Bilirubin: 0.8 mg/dL (ref 0.2–1.2)
TOTAL PROTEIN: 5.9 g/dL — AB (ref 6.1–8.1)
Total Bilirubin: 1 mg/dL (ref 0.2–1.2)

## 2015-05-01 MED FILL — *IMBRUVICA 140 MG CAPSULE: 140 | 30 days supply | Qty: 90 | Fill #6

## 2015-05-01 NOTE — Addendum Note (Signed)
Addended by: Eulis Foster on: 05/01/2015 09:19 AM   Modules accepted: Orders

## 2015-05-07 ENCOUNTER — Other Ambulatory Visit: Payer: Self-pay | Admitting: Hematology and Oncology

## 2015-05-07 DIAGNOSIS — C9110 Chronic lymphocytic leukemia of B-cell type not having achieved remission: Secondary | ICD-10-CM

## 2015-05-07 MED ORDER — IBRUTINIB 140 MG PO CAPS: 420.0000 mg | ORAL_CAPSULE | Freq: Every day | ORAL | Status: DC

## 2015-05-31 ENCOUNTER — Ambulatory Visit (HOSPITAL_BASED_OUTPATIENT_CLINIC_OR_DEPARTMENT_OTHER): Payer: Medicare Other | Admitting: Hematology and Oncology

## 2015-05-31 ENCOUNTER — Other Ambulatory Visit (HOSPITAL_BASED_OUTPATIENT_CLINIC_OR_DEPARTMENT_OTHER): Payer: Medicare Other

## 2015-05-31 VITALS — BP 193/52 | HR 57 | Temp 97.8°F | Resp 18 | Ht 60.0 in | Wt 103.1 lb

## 2015-05-31 DIAGNOSIS — C9110 Chronic lymphocytic leukemia of B-cell type not having achieved remission: Secondary | ICD-10-CM

## 2015-05-31 DIAGNOSIS — I10 Essential (primary) hypertension: Secondary | ICD-10-CM | POA: Diagnosis not present

## 2015-05-31 DIAGNOSIS — R233 Spontaneous ecchymoses: Secondary | ICD-10-CM | POA: Insufficient documentation

## 2015-05-31 DIAGNOSIS — R238 Other skin changes: Secondary | ICD-10-CM

## 2015-05-31 LAB — CBC WITH DIFFERENTIAL/PLATELET
BASO%: 0.4 % (ref 0.0–2.0)
Basophils Absolute: 0 10*3/uL (ref 0.0–0.1)
EOS%: 0.6 % (ref 0.0–7.0)
Eosinophils Absolute: 0.1 10*3/uL (ref 0.0–0.5)
HCT: 39 % (ref 34.8–46.6)
HGB: 13.1 g/dL (ref 11.6–15.9)
LYMPH%: 82 % — AB (ref 14.0–49.7)
MCH: 31.5 pg (ref 25.1–34.0)
MCHC: 33.6 g/dL (ref 31.5–36.0)
MCV: 93.8 fL (ref 79.5–101.0)
MONO#: 0.6 10*3/uL (ref 0.1–0.9)
MONO%: 5.6 % (ref 0.0–14.0)
NEUT#: 1.2 10*3/uL — ABNORMAL LOW (ref 1.5–6.5)
NEUT%: 11.4 % — AB (ref 38.4–76.8)
PLATELETS: 116 10*3/uL — AB (ref 145–400)
RBC: 4.16 10*6/uL (ref 3.70–5.45)
RDW: 13 % (ref 11.2–14.5)
WBC: 10.8 10*3/uL — ABNORMAL HIGH (ref 3.9–10.3)
lymph#: 8.8 10*3/uL — ABNORMAL HIGH (ref 0.9–3.3)

## 2015-05-31 LAB — COMPREHENSIVE METABOLIC PANEL
ALT: 12 U/L (ref 0–55)
ANION GAP: 10 meq/L (ref 3–11)
AST: 23 U/L (ref 5–34)
Albumin: 3.7 g/dL (ref 3.5–5.0)
Alkaline Phosphatase: 55 U/L (ref 40–150)
BUN: 15 mg/dL (ref 7.0–26.0)
CHLORIDE: 103 meq/L (ref 98–109)
CO2: 27 meq/L (ref 22–29)
CREATININE: 1 mg/dL (ref 0.6–1.1)
Calcium: 9.9 mg/dL (ref 8.4–10.4)
EGFR: 53 mL/min/{1.73_m2} — ABNORMAL LOW (ref >90)
Glucose: 102 mg/dl (ref 70–140)
POTASSIUM: 3.8 meq/L (ref 3.5–5.1)
Sodium: 139 mEq/L (ref 136–145)
Total Bilirubin: 0.91 mg/dL (ref 0.20–1.20)
Total Protein: 6.2 g/dL — ABNORMAL LOW (ref 6.4–8.3)

## 2015-05-31 MED ORDER — IBRUTINIB 140 MG PO CAPS: 420.0000 mg | ORAL_CAPSULE | Freq: Every day | ORAL | Status: DC

## 2015-05-31 MED FILL — *IMBRUVICA 140 MG CAPSULE: 140 | 30 days supply | Qty: 90 | Fill #0

## 2015-05-31 NOTE — Assessment & Plan Note (Signed)
Her blood pressure is very high today which she attributed that to whitecoat hypertension. According to her blood pressure monitoring at home, usually within acceptable limits. She will continue same medical management and follow closely with primary care doctor and her cardiologist for medication adjustment.

## 2015-05-31 NOTE — Assessment & Plan Note (Signed)
She is doing well. Anemia has resolved and she has improvement with rising platelet count. Apart from bruising, and occasional diarrhea which she thinks might be dietary related, she has no other side effects.. We will continue treatment indefinitely. With stability of her blood count, I will see her back in 3 months. The mild ymphocytosis seen is a well-known effect from the treatment

## 2015-05-31 NOTE — Progress Notes (Signed)
[facility] OFFICE PROGRESS NOTE  Patient Care Team: Kristie Cowman, MD as PCP - General (Family Medicine) Kristie Cowman, MD (Family Medicine) Terance Ice, MD (Cardiology) Sherren Mocha, MD as Consulting Physician (Cardiology)  SUMMARY OF ONCOLOGIC HISTORY:   CLL (chronic lymphocytic leukemia) ([place])   03/21/2011 Initial Diagnosis CLL (chronic lymphocytic leukemia)   01/14/2014 - 01/31/2014 Hospital Admission The patient was hospitalized and was found to severe aortic stenosis causing syncopal episode. She received treatment with steroids with minimum success and required platelet transfusion.   09/18/2014 Imaging CT scan of the chest, abdomen and pelvis show lymphadenopathy but low level burden of disease.   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 There was no stenosis. Mild peri-valvular aortic insufficiency is noted   11/04/2014 -  Chemotherapy She is started on Ibrutinib    INTERVAL HISTORY: Please see below for problem oriented charting. She returns for further follow-up. She tolerated treatment well apart from occasional diarrhea, which she thinks is dietary related and skin bruising. Denies recent infection. Denies palpitation or irregular heartbeat. No new lymphadenopathy. The patient denies any recent signs or symptoms of bleeding such as spontaneous epistaxis, hematuria or hematochezia.   REVIEW OF SYSTEMS:   Constitutional: Denies fevers, chills or abnormal weight loss Eyes: Denies blurriness of vision Ears, nose, mouth, throat, and face: Denies mucositis or sore throat Respiratory: Denies cough, dyspnea or wheezes Cardiovascular: Denies palpitation, chest discomfort or lower extremity swelling Skin: Denies abnormal skin rashes Lymphatics: Denies new lymphadenopathy  Neurological:Denies numbness, tingling or new weaknesses Behavioral/Psych: Mood  is stable, no new changes  All other systems were reviewed with the patient and are negative.  I have reviewed the past medical history, past surgical history, social history and family history with the patient and they are unchanged from previous note.  ALLERGIES:  has No Known Allergies.  MEDICATIONS:  Current Outpatient Prescriptions  Medication Sig Dispense Refill  . acetaminophen (TYLENOL) 325 MG tablet Take 650 mg by mouth every 6 (six) hours as needed.    Marland Kitchen amLODipine (NORVASC) 5 MG tablet Take 1 tablet (5 mg total) by mouth daily. 90 tablet 1  . amoxicillin (AMOXIL) 500 MG tablet Take 4 tablets by mouth one hour prior to dental procedure 8 tablet 2  . atorvastatin (LIPITOR) 10 MG tablet Take 1 tablet (10 mg total) by mouth daily at 6 PM. 90 tablet 1  . ibrutinib (IMBRUVICA) 140 MG capsul Take 3 capsules (420 mg total) by mouth daily. 90 capsule 6  . lisinopril (PRINIVIL,ZESTRIL) 20 MG tablet Take 1 tablet (20 mg total) by mouth daily. 90 tablet 1  . metoprolol succinate (TOPROL-XL) 50 MG 24 hr tablet Take 1 tablet (50 mg total) by mouth daily. Take with or immediately following a meal. 90 tablet 1   No current facility-administered medications for this visit.    PHYSICAL EXAMINATION: ECOG PERFORMANCE STATUS: 0 - Asymptomatic  Filed Vitals:   05/31/15 0829 05/31/15 0834  BP: 191/41 193/52  Pulse: 62 57  Temp: 97.8 F (36.6 C)   Resp: 18    Filed Weights   05/31/15 0829  Weight: 103 lb 1.6 oz (46.766 kg)    GENERAL:alert, no distress and comfortable SKIN: No skin bruising. No petechiae. EYES: normal, Conjunctiva are pink and non-injected, sclera clear OROPHARYNX:no exudate, no erythema and lips, buccal mucosa, and tongue normal  NECK: supple, thyroid normal size, non-tender, without nodularity LYMPH:  no palpable lymphadenopathy in the cervical, axillary or inguinal LUNGS: clear to auscultation and percussion with normal breathing effort HEART: regular rate & rhythm  and no murmurs and no lower extremity edema ABDOMEN:abdomen soft, non-tender and normal bowel sounds Musculoskeletal:no cyanosis of digits and no clubbing  NEURO: alert & oriented x 3 with fluent speech, no focal motor/sensory deficits  LABORATORY DATA:  I have reviewed the data as listed    Component Value Date/Time   NA 139 05/31/2015 0805   NA 135 01/21/2014 0420   K 3.8 05/31/2015 0805   K 3.7 01/21/2014 0420   CL 95* 01/21/2014 0420   CL 105 05/20/2012 0823   CO2 27 05/31/2015 0805   CO2 29 01/21/2014 0420   GLUCOSE 102 05/31/2015 0805   GLUCOSE 129* 01/21/2014 0420   GLUCOSE 70 05/20/2012 0823   BUN 15.0 05/31/2015 0805   BUN 18 01/21/2014 0420   CREATININE 1.0 05/31/2015 0805   CREATININE 1.05 01/21/2014 0420   CALCIUM 9.9 05/31/2015 0805   CALCIUM 8.7 01/21/2014 0420   PROT 6.2* 05/31/2015 0805   PROT 5.9* 05/01/2015 0920   ALBUMIN 3.7 05/31/2015 0805   ALBUMIN 3.8 05/01/2015 0920   AST 23 05/31/2015 0805   AST 26 05/01/2015 0920   ALT 12 05/31/2015 0805   ALT 13 05/01/2015 0920   ALKPHOS 55 05/31/2015 0805   ALKPHOS 47 05/01/2015 0920   BILITOT 0.91 05/31/2015 0805   BILITOT 1.0 05/01/2015 0920   GFRNONAA 51* 01/21/2014 0420   GFRAA 60* 01/21/2014 0420    No results found for: SPEP, UPEP  Lab Results  Component Value Date   WBC 10.8* 05/31/2015   NEUTROABS 1.2* 05/31/2015   HGB 13.1 05/31/2015   HCT 39.0 05/31/2015   MCV 93.8 05/31/2015   PLT 116* 05/31/2015      Chemistry      Component Value Date/Time   NA 139 05/31/2015 0805   NA 135 01/21/2014 0420   K 3.8 05/31/2015 0805   K 3.7 01/21/2014 0420   CL 95* 01/21/2014 0420   CL 105 05/20/2012 0823   CO2 27 05/31/2015 0805   CO2 29 01/21/2014 0420   BUN 15.0 05/31/2015 0805   BUN 18 01/21/2014 0420   CREATININE 1.0 05/31/2015 0805   CREATININE 1.05 01/21/2014 0420      Component Value Date/Time   CALCIUM 9.9 05/31/2015 0805   CALCIUM 8.7 01/21/2014 0420   ALKPHOS 55 05/31/2015 0805    ALKPHOS 47 05/01/2015 0920   AST 23 05/31/2015 0805   AST 26 05/01/2015 0920   ALT 12 05/31/2015 0805   ALT 13 05/01/2015 0920   BILITOT 0.91 05/31/2015 0805   BILITOT 1.0 05/01/2015 0920      ASSESSMENT & PLAN:  CLL (chronic lymphocytic leukemia) She is doing well. Anemia has resolved and she has improvement with rising platelet count. Apart from bruising, and occasional diarrhea which she thinks might be dietary related, she has no other side effects.. We will continue treatment indefinitely. With stability of her blood count, I will see her back in 3 months. The mild ymphocytosis seen is a well-known effect from the treatment  Essential hypertension Her blood pressure is very high today which she attributed that to whitecoat hypertension. According to her blood pressure monitoring at home, usually within acceptable limits. She will continue same medical management and follow closely with primary care doctor and her cardiologist for medication adjustment.  Easy bruising This is well-known side effects of Ibrutinib  Continue close monitoring. She will continue aspirin therapy   No orders of the defined types were placed in this encounter.   All questions were answered. The patient knows to call the clinic with any problems, questions or concerns. No barriers to learning was detected. I spent 15 minutes counseling the patient face to face. The total time spent in the appointment was 20 minutes and more than 50% was on counseling and review of test results     [facility], [place], MD 05/31/2015 8:48 AM

## 2015-05-31 NOTE — Assessment & Plan Note (Signed)
This is well-known side effects of Ibrutinib Continue close monitoring. She will continue aspirin therapy

## 2015-06-18 ENCOUNTER — Ambulatory Visit: Payer: Medicare Other | Admitting: Cardiovascular Disease

## 2015-06-20 ENCOUNTER — Encounter: Payer: Self-pay | Admitting: Cardiovascular Disease

## 2015-06-20 ENCOUNTER — Ambulatory Visit (INDEPENDENT_AMBULATORY_CARE_PROVIDER_SITE_OTHER): Payer: Medicare Other | Admitting: Cardiovascular Disease

## 2015-06-20 VITALS — BP 120/80 | HR 64 | Ht 60.0 in | Wt 104.2 lb

## 2015-06-20 DIAGNOSIS — I359 Nonrheumatic aortic valve disorder, unspecified: Secondary | ICD-10-CM | POA: Diagnosis not present

## 2015-06-20 DIAGNOSIS — I2510 Atherosclerotic heart disease of native coronary artery without angina pectoris: Secondary | ICD-10-CM | POA: Diagnosis not present

## 2015-06-20 NOTE — Progress Notes (Signed)
Cardiology Office Note Date:  06/20/2015   ID:  Nicole Mercado, DOB 12/23/1940, MRN [id number]  PCP:  Andria Frames, MD  Cardiologist:  Sherren Mocha, MD    Chief Complaint  Patient presents with  . Essential Hypertension  . Coronary Artery Disease     History of Present Illness: Nicole Mercado is a 75 y.o. female who presents for follow-up evaluation, last seen in October 2016. She is followed for coronary and valvular heart disease. The patient has a history of CLL with chronic thrombocytopenia, hypertension, carotid stenosis status post left carotid endarterectomy in 2013, and severe aortic stenosis. She was hospitalized in 2016 with syncope. She was found to have very severe aortic stenosis as well as multivessel coronary artery disease with involvement of the distal left main, LAD, and circumflex branches. She underwent CABG and bioprosthetic aortic valve replacement by Dr. Cyndia Bent with a LIMA to LAD, saphenous vein graft to OM1, saphenous vein graft to diagonals, and bioprosthetic aortic valve replacement.  The patient feels well. She has no shortness of breath or chest discomfort. She's been physically active. She just returned from a visit to [place] and had some leg swelling, but this is now resolved. She complains of generalized fatigue and attributes this to some of her medications. Her blood counts have improved with treatment of CLL. She is back on a baby aspirin and tolerating this well.    Past Medical History  Diagnosis Date  . Thrombocytopenia ([place]) 11/19/2010  . Hypertension   . CLL (chronic lymphocytic leukemia) ([facility])     slow leukemia---Dr  Murinson  . Arthritis   . Aortic stenosis     a. Echocardiogram (01/11/14):  Mild LVH, EF 60-65%, Gr 1 DD, possible bicuspid AV, severe AS (mean 61 mmHg, peak 104 mmHg), mild MVP of post leaflet, mild MR, PASP 33 mmHg.;  b. 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 Pancreatitis     h/o  . Carotid artery occlusion     a. s/p L CEA 2013;   b.  Carotid US (1/16):  Bilateral ICA 1-39%  . CAD (coronary artery disease)     a. LHC (01/13/14):  dLM 70 extending into oLAD, mLAD 40-50, pD1 80, pD2 70, ostial/prox OM1 70 >> CABG (L-LAD, S-OM1, S-D1, S-D2)    . Hx of cardiovascular stress test     a. Nuclear stress test That (4/13): Normal perfusion, EF 74%  . Atrial fibrillation ([place])     post op after CABG+AVR >> Amiodarone  . Hx of echocardiogram     Echo (2/16):  Mild LVH, EF 60-65%, no RWMA, Gr 2 DD, AVR ok (mean 9 mmHg), trivial AI, mild MR, mild LAE, PASP 40 mmHg  . H/O exercise stress test     NSSTT    Past Surgical History  Procedure Laterality Date  . Cesarean section      FIVE  . Cholecystectomy    . Appendectomy    . Abdominal hysterectomy    . Tonsillectomy    . Endarterectomy  06/09/2011    Procedure: ENDARTERECTOMY CAROTID;  Surgeon: Rosetta Posner, MD;  Location: [facility] OR;  Service: Vascular;  Laterality: Left;  left carotid endarterectomy with patch angioplasty  . Carotid endarterectomy  06/09/11    LEFT  cea  . Left and right heart catheterization with coronary angiogram [name] 01/13/2014    Procedure: LEFT AND RIGHT HEART CATHETERIZATION WITH CORONARY ANGIOGRAM;  Surgeon: Jettie Booze, MD;  Location[facility] CATH LAB;  Service:  Cardiovascular;  Laterality: [name];  . Coronary artery bypass graft [name] 01/17/2014    Procedure: CORONARY ARTERY BYPASS GRAFTING (CABG)TIMES FOUR USING LEFT INTERNAL MAMMARY ARTERY AND RIGHT SAPHENOUS VEIN HARVESTED ENDOSCOPICALLY;  Surgeon: Gaye Pollack, MD;  Location: [facility];  Service: Open Heart Surgery;  Laterality: [name];  . Aortic valve replacement [name] 01/17/2014    Procedure: AORTIC VALVE REPLACEMENT (AVR);  Surgeon: Gaye Pollack, MD;  Location: [facility];  Service: Open Heart Surgery;  Laterality: [name];  . Tee without cardioversion [name] 01/17/2014    Procedure: TRANSESOPHAGEAL ECHOCARDIOGRAM (TEE);  Surgeon: Gaye Pollack, MD;  Location: [facility];  Service: Open Heart Surgery;  Laterality: [name];     Current Outpatient Prescriptions  Medication Sig Dispense Refill  . acetaminophen (TYLENOL) 325 MG tablet Take 650 mg by mouth every 6 (six) hours as needed for mild pain or headache.     Marland Kitchen amLODipine (NORVASC) 5 MG tablet Take 1 tablet (5 mg total) by mouth daily. 90 tablet 1  . amoxicillin (AMOXIL) 500 MG tablet Take 4 tablets by mouth one hour prior to dental procedure 8 tablet 2  . atorvastatin (LIPITOR) 10 MG tablet Take 1 tablet (10 mg total) by mouth daily at 6 PM. 90 tablet 1  . ibrutinib (IMBRUVICA) 140 MG capsul Take 3 capsules (420 mg total) by mouth daily. 90 capsule 6  . lisinopril (PRINIVIL,ZESTRIL) 20 MG tablet Take 1 tablet (20 mg total) by mouth daily. 90 tablet 1  . metoprolol succinate (TOPROL-XL) 50 MG 24 hr tablet Take 1 tablet (50 mg total) by mouth daily. Take with or immediately following a meal. 90 tablet 1   No current facility-administered medications for this visit.    Allergies:   Review of patient's allergies indicates no known allergies.   Social History:  The patient  reports that she has never smoked. She has never used smokeless tobacco. She reports that she does not drink alcohol or use illicit drugs.   Family History:  The patient's  family history includes Heart attack in her mother; Heart disease in her father and mother; Hypertension in her father, mother, sister, and son; Stroke in her paternal grandfather and paternal grandmother.    ROS:  Please see the history of present illness.  Otherwise, review of systems is positive for Easy bruising.  All other systems are reviewed and negative.    PHYSICAL EXAM: VS:  BP 120/80 mmHg  Pulse 64  Ht 5' (1.524 m)  Wt 104 lb 3.2 oz (47.265 kg)  BMI 20.35 kg/m2 , BMI Body mass index is 20.35 kg/(m^2). GEN: Well nourished, well developed, in no acute distress HEENT: normal Neck: no JVD, no masses. There is a right carotid bruit Cardiac: RRR with 2/6 systolic ejection murmur at the right upper sternal  border and grade 1/6 diastolic decrescendo murmur at the right upper sternal border              Respiratory:  clear to auscultation bilaterally, normal work of breathing GI: soft, nontender, nondistended, + BS MS: no deformity or atrophy Ext: no pretibial edema, pedal pulses 2+= bilaterally Skin: warm and dry, no rash Neuro:  Strength and sensation are intact Psych: euthymic mood, full affect  EKG:  EKG is ordered today. The ekg ordered today shows normal sinus rhythm 65 bpm, PACs, possible anteroseptal infarct age undetermined.  Recent Labs: 05/31/2015: ALT 12; BUN 15.0; Creatinine 1.0; HGB 13.1; Platelets 116*; Potassium 3.8; Sodium 139   Lipid Panel  Component Value Date/Time   CHOL 140 05/01/2015 0920   TRIG 115 05/01/2015 0920   HDL 69 05/01/2015 0920   CHOLHDL 2.0 05/01/2015 0920   VLDL 23 05/01/2015 0920   LDLCALC 48 05/01/2015 0920      Wt Readings from Last 3 Encounters:  06/20/15 104 lb 3.2 oz (47.265 kg)  05/31/15 103 lb 1.6 oz (46.766 kg)  03/05/15 103 lb 14.4 oz (47.129 kg)     Cardiac Studies Reviewed: 2D Echo 09-2014: Study Conclusions  - Left ventricle: The cavity size was normal. Wall thickness was  normal. Septal bounce consistent with prior cardiac surgery.  Systolic function was normal. The estimated ejection fraction was  in the range of 55% to 60%. Wall motion was normal; there were no  regional wall motion abnormalities. Doppler parameters are  consistent with abnormal left ventricular relaxation (grade 1  diastolic dysfunction). - Aortic valve: Bioprosthetic aortic valve. There was no stenosis.  Mild peri-valvular aortic insufficiency. Mean gradient (S): 9 mm  Hg. - Mitral valve: Primarily posterior leaflet prolapse. There was  mild regurgitation. - Right ventricle: The cavity size was normal. Systolic function  was normal. - Tricuspid valve: Peak RV-RA gradient (S): 20 mm Hg. - Pulmonary arteries: PA peak pressure: 23 mm Hg  (S). - Inferior vena cava: The vessel was normal in size. The  respirophasic diameter changes were in the normal range (= 50%),  consistent with normal central venous pressure.  Impressions:  - Normal LV size and systolic function, EF 0000000. Normal RV size  and systolic function. Bioprosthetic aortic valve with mild  peri-valvular aortic insufficiency. Mild posterior leaflet mitral  valve prolapse with mild MR.  ASSESSMENT AND PLAN: 1.  Aortic valve disease status post bioprosthetic aortic valve replacement: Most recent echo reviewed. She does have a soft diastolic decrescendo murmur consistent with her known mild paravalvular aortic insufficiency. Will repeat an echocardiogram in September at a one-year interval from her previous study. She has NYHA functional class I symptoms.  2. CAD status post CABG: Stable without symptoms of angina. Medical program is reviewed and will be continued. She has treated with a statin drug, ACE inhibitor, beta blocker, and low-dose aspirin  3. Essential hypertension: Blood pressure appears to be well controlled. She will continue on amlodipine, lisinopril, and metoprolol succinate  4. Hyperlipidemia: Treated with atorvastatin.  5. Carotid stenosis without history of stroke: Patient is followed by vascular surgery. Next appointment is in August  6. CLL: Most recent labs reviewed - blood counts are improved. Followed closely at the cancer center by Dr. Alvy Bimler  Current medicines are reviewed with the patient today.  The patient does not have concerns regarding medicines.  Labs/ tests ordered today include:  No orders of the defined types were placed in this encounter.    Disposition:   FU one year  Signed, Sherren Mocha, MD  06/20/2015 10:44 AM    [facility] [address], [place], [place]  [address] Phone: [phone]; Fax: [phone]

## 2015-06-20 NOTE — Patient Instructions (Signed)
Medication Instructions:  Your physician recommends that you continue on your current medications as directed. Please refer to the Current Medication list given to you today.  Labwork: No new orders.   Testing/Procedures: Your physician has requested that you have an echocardiogram in September. Echocardiography is a painless test that uses sound waves to create images of your heart. It provides your doctor with information about the size and shape of your heart and how well your heart's chambers and valves are working. This procedure takes approximately one hour. There are no restrictions for this procedure.  Follow-Up: Your physician wants you to follow-up in: 1 YEAR with Dr Burt Knack.  You will receive a reminder letter in the mail two months in advance. If you don't receive a letter, please call our office to schedule the follow-up appointment.   Any Other Special Instructions Will Be Listed Below (If Applicable).     If you need a refill on your cardiac medications before your next appointment, please call your pharmacy.

## 2015-06-28 ENCOUNTER — Ambulatory Visit: Payer: Medicare Other | Admitting: Cardiology

## 2015-06-29 MED FILL — *IMBRUVICA 140 MG CAPSULE: 140 | 30 days supply | Qty: 90 | Fill #1

## 2015-07-02 ENCOUNTER — Other Ambulatory Visit: Payer: Self-pay | Admitting: Cardiovascular Disease

## 2015-07-30 MED FILL — *IMBRUVICA 140 MG CAPSULE: 140 | 30 days supply | Qty: 90 | Fill #2

## 2015-08-20 ENCOUNTER — Ambulatory Visit (INDEPENDENT_AMBULATORY_CARE_PROVIDER_SITE_OTHER): Payer: Medicare Other | Admitting: Cardiology

## 2015-08-20 ENCOUNTER — Encounter: Payer: Self-pay | Admitting: Cardiology

## 2015-08-20 ENCOUNTER — Telehealth: Payer: Self-pay | Admitting: Cardiovascular Disease

## 2015-08-20 VITALS — BP 153/62 | HR 67 | Ht 60.0 in | Wt 102.8 lb

## 2015-08-20 DIAGNOSIS — I2510 Atherosclerotic heart disease of native coronary artery without angina pectoris: Secondary | ICD-10-CM | POA: Diagnosis not present

## 2015-08-20 DIAGNOSIS — Z954 Presence of other heart-valve replacement: Secondary | ICD-10-CM | POA: Diagnosis not present

## 2015-08-20 DIAGNOSIS — Z952 Presence of prosthetic heart valve: Secondary | ICD-10-CM

## 2015-08-20 DIAGNOSIS — R55 Syncope and collapse: Secondary | ICD-10-CM

## 2015-08-20 NOTE — Patient Instructions (Signed)
Medication Instructions:  The current medical regimen is effective;  continue present plan and medications.  Testing/Procedures: Your physician has recommended that you wear an event monitor for 30 days. Event monitors are medical devices that record the heart's electrical activity. Doctors most often [place] these monitors to diagnose arrhythmias. Arrhythmias are problems with the speed or rhythm of the heartbeat. The monitor is a small, portable device. You can wear one while you do your normal daily activities. This is usually used to diagnose what is causing palpitations/syncope (passing out).  Follow-Up: Follow up with Dr. Burt Knack or APP in 6 weeks.  If you need a refill on your cardiac medications before your next appointment, please call your pharmacy.  Thank you for choosing [facility]!!    Please limit driving for the time being until it is determined what caused your syncope.

## 2015-08-20 NOTE — Progress Notes (Signed)
Cardiology Office Note    Date:  08/20/2015   ID:  Nicole Mercado, DOB March 09, 1940, MRN [id number]  PCP:  Andria Frames, MD  Cardiologist:  Dr. Burt Knack     History of Present Illness:  Nicole Mercado is a 75 y.o. female os to aortic valve replacement as well as CABG in 2016 after an episode of syncope revealed severe aortic stenosis as well as multivessel coronary artery disease.  On 06/20/15 was last seen by Dr. Burt Knack and she was feeling well the time without any chest pain shortness of breath and had been physically active returning from [place] with only minor leg swelling. CLL had been stable. She did have generalized fatigue which he attributes to some of her medication.  Unfortunately this morning when in the shower, she had an episode where she blacked out. She passed out, hit her head on the bathroom floor. Was getting pills out. Felt funny. No palpitations. She felt dizzy before this happened. She does not remember getting any palpitations. This happened a year ago. She had an achy pain in her right arm throughout the night. She has not had any recurring syncopal episodes other than the one prior to her surgery.  Her blood pressure has been treated with amlodipine, lisinopril and metoprolol for quite some time and has been excellent, in the 120 range with Dr. Antionette Char last visit.  She is lying down here in the office, 177/60 with a pulse of 59, standing was 135/57 with a pulse of 72-she felt well at the time.  At home, she has been running in the 123456 systolic fairly consistently. She states that she lives alone.     Past Medical History:  Diagnosis Date  . Aortic stenosis    a. Echocardiogram (01/11/14):  Mild LVH, EF 60-65%, Gr 1 DD, possible bicuspid AV, severe AS (mean 61 mmHg, peak 104 mmHg), mild MVP of post leaflet, mild MR, PASP 33 mmHg.;  b. 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 Arthritis   . Atrial fibrillation ([place])    post op after CABG+AVR >> Amiodarone  . CAD  (coronary artery disease)    a. LHC (01/13/14):  dLM 70 extending into oLAD, mLAD 40-50, pD1 80, pD2 70, ostial/prox OM1 70 >> CABG (L-LAD, S-OM1, S-D1, S-D2)    . Carotid artery occlusion    a. s/p L CEA 2013;  b.  Carotid US (1/16):  Bilateral ICA 1-39%  . CLL (chronic lymphocytic leukemia) ([facility])    slow leukemia---Dr  Murinson  . H/O exercise stress test    NSSTT  . Hx of cardiovascular stress test    a. Nuclear stress test That (4/13): Normal perfusion, EF 74%  . Hx of echocardiogram    Echo (2/16):  Mild LVH, EF 60-65%, no RWMA, Gr 2 DD, AVR ok (mean 9 mmHg), trivial AI, mild MR, mild LAE, PASP 40 mmHg  . Hypertension   . Pancreatitis    h/o  . Thrombocytopenia ([place]) 11/19/2010    Past Surgical History:  Procedure Laterality Date  . ABDOMINAL HYSTERECTOMY    . AORTIC VALVE REPLACEMENT [name] 01/17/2014   Procedure: AORTIC VALVE REPLACEMENT (AVR);  Surgeon: Gaye Pollack, MD;  Location: [facility];  Service: Open Heart Surgery;  Laterality: [name];  . APPENDECTOMY    . CAROTID ENDARTERECTOMY  06/09/11   LEFT  cea  . CESAREAN SECTION     FIVE  . CHOLECYSTECTOMY    . CORONARY ARTERY BYPASS GRAFT [name] 01/17/2014  Procedure: CORONARY ARTERY BYPASS GRAFTING (CABG)TIMES FOUR USING LEFT INTERNAL MAMMARY ARTERY AND RIGHT SAPHENOUS VEIN HARVESTED ENDOSCOPICALLY;  Surgeon: Gaye Pollack, MD;  Location: [facility] OR;  Service: Open Heart Surgery;  Laterality: [name];  . ENDARTERECTOMY  06/09/2011   Procedure: ENDARTERECTOMY CAROTID;  Surgeon: Rosetta Posner, MD;  Location: [facility] OR;  Service: Vascular;  Laterality: Left;  left carotid endarterectomy with patch angioplasty  . LEFT AND RIGHT HEART CATHETERIZATION WITH CORONARY ANGIOGRAM [name] 01/13/2014   Procedure: LEFT AND RIGHT HEART CATHETERIZATION WITH CORONARY ANGIOGRAM;  Surgeon: Jettie Booze, MD;  Location: [facility] CATH LAB;  Service: Cardiovascular;  Laterality: [name];  . TEE WITHOUT CARDIOVERSION [name] 01/17/2014   Procedure: TRANSESOPHAGEAL ECHOCARDIOGRAM (TEE);   Surgeon: Gaye Pollack, MD;  Location: [facility];  Service: Open Heart Surgery;  Laterality: [name];  . TONSILLECTOMY      Current Medications: Outpatient Medications Prior to Visit  Medication Sig Dispense Refill  . acetaminophen (TYLENOL) 325 MG tablet Take 650 mg by mouth every 6 (six) hours as needed for mild pain or headache.     Marland Kitchen amLODipine (NORVASC) 5 MG tablet Take 1 tablet (5 mg total) by mouth daily. 90 tablet 1  . amoxicillin (AMOXIL) 500 MG tablet Take 4 tablets by mouth one hour prior to dental procedure 8 tablet 2  . aspirin 81 MG tablet Take 81 mg by mouth daily.    Marland Kitchen atorvastatin (LIPITOR) 10 MG tablet TAKE ONE TABLET BY MOUTH ONCE DAILY AT  6PM 90 tablet 3  . ibrutinib (IMBRUVICA) 140 MG capsul Take 3 capsules (420 mg total) by mouth daily. 90 capsule 6  . lisinopril (PRINIVIL,ZESTRIL) 20 MG tablet Take 1 tablet (20 mg total) by mouth daily. 90 tablet 1  . metoprolol succinate (TOPROL-XL) 50 MG 24 hr tablet Take 1 tablet (50 mg total) by mouth daily. Take with or immediately following a meal. 90 tablet 1   No facility-administered medications prior to visit.      Allergies:   Review of patient's allergies indicates no known allergies.   Social History   Social History  . Marital status: Widowed    Spouse name: [name]  . Number of children: [name]  . Years of education: [name]   Social History Main Topics  . Smoking status: Never Smoker  . Smokeless tobacco: Never Used  . Alcohol use No  . Drug use: No  . Sexual activity: Not Asked   Other Topics Concern  . None   Social History Narrative  . None     Family History:  The patient's family history includes Heart attack in her mother; Heart disease in her father and mother; Hypertension in her father, mother, sister, and son; Stroke in her paternal grandfather and paternal grandmother.   ROS:   Please see the history of present illness.    ROS All other systems reviewed and are negative.   PHYSICAL EXAM:   VS:  BP  (!) 153/62 (BP Location: Right Arm, Cuff Size: Normal)   Pulse 67   Ht 5' (1.524 m)   Wt 102 lb 12.8 oz (46.6 kg)   BMI 20.08 kg/m    GEN: Well nourished, well developed, in no acute distress  HEENT: normal  Neck: no JVD, carotid bruits, or masses Cardiac: RRR; no murmurs, rubs, or gallops,no edema  Respiratory:  clear to auscultation bilaterally, normal work of breathing GI: soft, nontender, nondistended, + BS MS: no deformity or atrophy  Skin: warm and dry, no rash Neuro:  Alert and Oriented x 3, Strength and sensation are intact Psych: euthymic mood, full affect  Wt Readings from Last 3 Encounters:  08/20/15 102 lb 12.8 oz (46.6 kg)  06/20/15 104 lb 3.2 oz (47.3 kg)  05/31/15 103 lb 1.6 oz (46.8 kg)      Studies/Labs Reviewed:   EKG:  EKG is ordered today.  The ekg ordered today demonstrates 08/20/15-normal sinus rhythm, 62, no other abnormalities.  Most recent echocardiogram shows reassuring aortic valve, normal EF.  Recent Labs: 05/31/2015: ALT 12; BUN 15.0; Creatinine 1.0; HGB 13.1; Platelets 116; Potassium 3.8; Sodium 139   Lipid Panel    Component Value Date/Time   CHOL 140 05/01/2015 0920   TRIG 115 05/01/2015 0920   HDL 69 05/01/2015 0920   CHOLHDL 2.0 05/01/2015 0920   VLDL 23 05/01/2015 0920   LDLCALC 48 05/01/2015 0920    Additional studies/ records that were reviewed today include:  Prior office notes, lab work, EKG reviewed    ASSESSMENT:    1. Syncope, unspecified syncope type   2. Coronary artery disease involving native coronary artery of native heart without angina pectoris   3. S/P AVR      PLAN:  In order of problems listed above:  Syncope  - May be related to orthostasis. Interestingly, syncope was the inciting mechanism for her discovery of severe aortic stenosis and aortic valve replacement. Her bioprosthetic aortic valve seems to working very well. I do not hear any significant murmurs today. Recent echocardiogram reviewed post  surgery. Her blood pressure did decrease during standing from 177 down to 135 and her pulse increased from 59-72 however at home she is mostly running in the 123456 to [id number] systolic opening to her home blood pressure measurements. I do not feel comfortable all emanating her amlodipine for instance from her drug regimen given these levels of hypertension. She does not demonstrate any evidence of focal weakness, visual disturbance, neurologic impairment at this time. Cranial nerves are intact. Strength is intact. She is ambulating well. She is smiling, feeling well. I do not think she needs to be hospitalized. She did not have any diaphoresis or nausea surrounding the incident to which makes vasovagal less likely.   - I will order her an event monitor for 30 days. I have her asked her to refrain from driving.  Aortic valve replacement  - Stable, no significant murmur heard on exam. I do not think that she has developed any significant valvular stenosis at this time.  Coronary artery disease  - No anginal symptoms. Doubt that her syncope is related.  Essential hypertension  - Blood pressure has been quite labile in the past. 225/17 visit with oncology, her blood pressure was very high which she attributes to white coat hypertension. It was 191/41.  Medication Adjustments/Labs and Tests Ordered: Current medicines are reviewed at length with the patient today.  Concerns regarding medicines are outlined above.  Medication changes, Labs and Tests ordered today are listed in the Patient Instructions below. Patient Instructions  Medication Instructions:  The current medical regimen is effective;  continue present plan and medications.  Testing/Procedures: Your physician has recommended that you wear an event monitor for 30 days. Event monitors are medical devices that record the heart's electrical activity. Doctors most often [place] these monitors to diagnose arrhythmias. Arrhythmias are problems with the speed  or rhythm of the heartbeat. The monitor is a small, portable device. You can wear one while you do your normal daily activities. This is  usually used to diagnose what is causing palpitations/syncope (passing out).  Follow-Up: Follow up with Dr. Burt Knack or APP in 6 weeks.  If you need a refill on your cardiac medications before your next appointment, please call your pharmacy.  Thank you for choosing [facility]!!    Please limit driving for the time being until it is determined what caused your syncope.    Signed, Candee Furbish, MD  08/20/2015 10:32 AM    [facility] [address], [place], [place]  [id number] Phone: [phone]; Fax: [phone]

## 2015-08-20 NOTE — Telephone Encounter (Signed)
I spoke with the pt and she complains of a syncopal episode around 6 AM this morning. The pt was standing in her bathroom this morning getting her medications when she became dizzy and fell to the floor hitting the back of her head.  The pt feels like she did have a LOC for about 1 minute.  The pt did sustain injury to the back of her head and she has been able to stop the bleeding. The only other symptom the pt noted was pain in her right arm during the night.  The pt does have a history of syncope in 2016 prior to her cardiac surgery.  The pt has not checked her vital signs this morning. The pt request evaluation today.   I spoke with Dr Marlou Porch DOD and he will see the pt today at 9:45 AM.  The pt was advised not to drive herself to appointment and she has already made arrangements for transportation.

## 2015-08-20 NOTE — Telephone Encounter (Signed)
New message    Pt verbalized that she is calling to speak to rn, she stated she passed out around 6am this morjning  Pt c/o Syncope: STAT if syncope occurred within 30 minutes and pt complains of lightheadedness High Priority if episode of passing out, completely, today or in last 24 hours   1. Did you pass out today? yes  2. When is the last time you passed out?  year ago  3. Has this occurred multiple times?  no  4. Did you have any symptoms prior to passing out? Her right arm was hurting

## 2015-08-23 ENCOUNTER — Ambulatory Visit (INDEPENDENT_AMBULATORY_CARE_PROVIDER_SITE_OTHER): Payer: Medicare Other

## 2015-08-23 DIAGNOSIS — R55 Syncope and collapse: Secondary | ICD-10-CM

## 2015-08-27 ENCOUNTER — Other Ambulatory Visit: Payer: Self-pay | Admitting: *Deleted

## 2015-08-27 DIAGNOSIS — I6523 Occlusion and stenosis of bilateral carotid arteries: Secondary | ICD-10-CM

## 2015-08-28 ENCOUNTER — Ambulatory Visit: Payer: Medicare Other | Admitting: Family

## 2015-08-28 ENCOUNTER — Encounter: Payer: Self-pay | Admitting: Hematology and Oncology

## 2015-08-28 ENCOUNTER — Telehealth: Payer: Self-pay | Admitting: *Deleted

## 2015-08-28 ENCOUNTER — Ambulatory Visit (HOSPITAL_COMMUNITY)
Admission: RE | Admit: 2015-08-28 | Discharge: 2015-08-28 | Disposition: A | Discharge status: Home / Self Care | Payer: Medicare Other | Source: Ambulatory Visit | Attending: Vascular Surgery | Admitting: Vascular Surgery

## 2015-08-28 ENCOUNTER — Other Ambulatory Visit (HOSPITAL_BASED_OUTPATIENT_CLINIC_OR_DEPARTMENT_OTHER): Payer: Medicare Other

## 2015-08-28 ENCOUNTER — Ambulatory Visit (HOSPITAL_BASED_OUTPATIENT_CLINIC_OR_DEPARTMENT_OTHER): Payer: Medicare Other | Admitting: Hematology and Oncology

## 2015-08-28 DIAGNOSIS — I6523 Occlusion and stenosis of bilateral carotid arteries: Secondary | ICD-10-CM | POA: Diagnosis present

## 2015-08-28 DIAGNOSIS — I2510 Atherosclerotic heart disease of native coronary artery without angina pectoris: Secondary | ICD-10-CM | POA: Insufficient documentation

## 2015-08-28 DIAGNOSIS — D696 Thrombocytopenia, unspecified: Secondary | ICD-10-CM

## 2015-08-28 DIAGNOSIS — C9110 Chronic lymphocytic leukemia of B-cell type not having achieved remission: Secondary | ICD-10-CM

## 2015-08-28 DIAGNOSIS — I10 Essential (primary) hypertension: Secondary | ICD-10-CM | POA: Insufficient documentation

## 2015-08-28 DIAGNOSIS — R310 Gross hematuria: Secondary | ICD-10-CM | POA: Diagnosis not present

## 2015-08-28 LAB — VAS US CAROTID
LCCADDIAS: 6 cm/s
LCCADSYS: 88 cm/s
LCCAPSYS: 88 cm/s
LICAPSYS: -66 cm/s
Left ICA dist dias: -9 cm/s
Left ICA dist sys: -68 cm/s
Left ICA prox dias: -9 cm/s
RCCADSYS: -126 cm/s
RCCAPDIAS: 10 cm/s
RIGHT CCA MID DIAS: 13 cm/s
Right CCA prox sys: 110 cm/s

## 2015-08-28 LAB — CBC WITH DIFFERENTIAL/PLATELET
BASO%: 0.2 % (ref 0.0–2.0)
BASOS ABS: 0 10*3/uL (ref 0.0–0.1)
EOS ABS: 0 10*3/uL (ref 0.0–0.5)
EOS%: 0.4 % (ref 0.0–7.0)
HCT: 36 % (ref 34.8–46.6)
HGB: 12.2 g/dL (ref 11.6–15.9)
LYMPH%: 72.5 % — AB (ref 14.0–49.7)
MCH: 31 pg (ref 25.1–34.0)
MCHC: 33.9 g/dL (ref 31.5–36.0)
MCV: 91.6 fL (ref 79.5–101.0)
MONO#: 1 10*3/uL — ABNORMAL HIGH (ref 0.1–0.9)
MONO%: 8.6 % (ref 0.0–14.0)
NEUT#: 2.1 10*3/uL (ref 1.5–6.5)
NEUT%: 18.3 % — ABNORMAL LOW (ref 38.4–76.8)
PLATELETS: 136 10*3/uL — AB (ref 145–400)
RBC: 3.93 10*6/uL (ref 3.70–5.45)
RDW: 12.8 % (ref 11.2–14.5)
WBC: 11.2 10*3/uL — AB (ref 3.9–10.3)
lymph#: 8.1 10*3/uL — ABNORMAL HIGH (ref 0.9–3.3)
nRBC: 0 % (ref 0–0)

## 2015-08-28 LAB — URINALYSIS, MICROSCOPIC - CHCC
BILIRUBIN (URINE): NEGATIVE
GLUCOSE UR CHCC: NEGATIVE mg/dL
KETONES: NEGATIVE mg/dL
Nitrite: POSITIVE
PH: 7 (ref 4.6–8.0)
SPECIFIC GRAVITY, URINE: 1.005 (ref 1.003–1.035)
Urobilinogen, UR: 0.2 mg/dL (ref 0.2–1)

## 2015-08-28 MED ORDER — CIPROFLOXACIN HCL 250 MG PO TABS: 250.0000 mg | ORAL_TABLET | Freq: Two times a day (BID) | ORAL | 0 refills | Status: DC

## 2015-08-28 MED FILL — *IMBRUVICA 140 MG CAPSULE: 140 | 30 days supply | Qty: 90 | Fill #3

## 2015-08-28 NOTE — Progress Notes (Signed)
[facility] OFFICE PROGRESS NOTE  Patient Care Team: Kristie Cowman, MD as PCP - General (Family Medicine) Kristie Cowman, MD (Family Medicine) Terance Ice, MD (Cardiology) Sherren Mocha, MD as Consulting Physician (Cardiology)  SUMMARY OF ONCOLOGIC HISTORY:   CLL (chronic lymphocytic leukemia) ([place])   03/21/2011 Initial Diagnosis    CLL (chronic lymphocytic leukemia)      01/14/2014 - 01/31/2014 Hospital Admission    The patient was hospitalized and was found to severe aortic stenosis causing syncopal episode. She received treatment with steroids with minimum success and required platelet transfusion.      09/18/2014 Imaging    CT scan of the chest, abdomen and pelvis show lymphadenopathy but low level burden of disease.      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 There was no stenosis. Mild peri-valvular aortic insufficiency is noted      11/04/2014 -  Chemotherapy    She is started on Ibrutinib       INTERVAL HISTORY: Please see below for problem oriented charting. She returns for follow-up. She complained of bruising. She denies diarrhea. Denies recent infection or new lymphadenopathy. She complained of recent hematuria. Denies hematochezia  REVIEW OF SYSTEMS:   Constitutional: Denies fevers, chills or abnormal weight loss Eyes: Denies blurriness of vision Ears, nose, mouth, throat, and face: Denies mucositis or sore throat Respiratory: Denies cough, dyspnea or wheezes Cardiovascular: Denies palpitation, chest discomfort or lower extremity swelling Gastrointestinal:  Denies nausea, heartburn or change in bowel habits Skin: Denies abnormal skin rashes Lymphatics: Denies new lymphadenopathy  Neurological:Denies numbness, tingling or new weaknesses Behavioral/Psych: Mood is stable, no new changes  All other systems were  reviewed with the patient and are negative.  I have reviewed the past medical history, past surgical history, social history and family history with the patient and they are unchanged from previous note.  ALLERGIES:  has No Known Allergies.  MEDICATIONS:  Current Outpatient Prescriptions  Medication Sig Dispense Refill  . acetaminophen (TYLENOL) 325 MG tablet Take 650 mg by mouth every 6 (six) hours as needed for mild pain or headache.     Marland Kitchen amLODipine (NORVASC) 5 MG tablet Take 1 tablet (5 mg total) by mouth daily. 90 tablet 1  . aspirin 81 MG tablet Take 81 mg by mouth daily.    Marland Kitchen atorvastatin (LIPITOR) 10 MG tablet TAKE ONE TABLET BY MOUTH ONCE DAILY AT  6PM 90 tablet 3  . ibrutinib (IMBRUVICA) 140 MG capsul Take 3 capsules (420 mg total) by mouth daily. 90 capsule 6  . lisinopril (PRINIVIL,ZESTRIL) 20 MG tablet Take 1 tablet (20 mg total) by mouth daily. 90 tablet 1  . metoprolol succinate (TOPROL-XL) 50 MG 24 hr tablet Take 1 tablet (50 mg total) by mouth daily. Take with or immediately following a meal. 90 tablet 1  . amoxicillin (AMOXIL) 500 MG tablet Take 4 tablets by mouth one hour prior to dental procedure (Patient not taking: Reported on 08/28/2015) 8 tablet 2   No current facility-administered medications for this visit.     PHYSICAL EXAMINATION: ECOG PERFORMANCE STATUS: 1 - Symptomatic but completely ambulatory  Vitals:   08/28/15 0816  BP: (!) 189/50  Pulse: (!) 57  Resp: 17  Temp: 98.4 F (36.9 C)   Filed Weights   08/28/15 0816  Weight: 104 lb 3.2 oz (47.3 kg)  GENERAL:alert, no distress and comfortable SKIN: Noted bruising. No petechiae EYES: normal, Conjunctiva are pink and non-injected, sclera clear OROPHARYNX:no exudate, no erythema and lips, buccal mucosa, and tongue normal  NECK: supple, thyroid normal size, non-tender, without nodularity LYMPH:  no palpable lymphadenopathy in the cervical, axillary or inguinal LUNGS: clear to auscultation and  percussion with normal breathing effort HEART: regular rate & rhythm and no murmurs and no lower extremity edema ABDOMEN:abdomen soft, non-tender and normal bowel sounds Musculoskeletal:no cyanosis of digits and no clubbing  NEURO: alert & oriented x 3 with fluent speech, no focal motor/sensory deficits  LABORATORY DATA:  I have reviewed the data as listed    Component Value Date/Time   NA 139 05/31/2015 0805   K 3.8 05/31/2015 0805   CL 95 (L) 01/21/2014 0420   CL 105 05/20/2012 0823   CO2 27 05/31/2015 0805   GLUCOSE 102 05/31/2015 0805   GLUCOSE 70 05/20/2012 0823   BUN 15.0 05/31/2015 0805   CREATININE 1.0 05/31/2015 0805   CALCIUM 9.9 05/31/2015 0805   PROT 6.2 (L) 05/31/2015 0805   ALBUMIN 3.7 05/31/2015 0805   AST 23 05/31/2015 0805   ALT 12 05/31/2015 0805   ALKPHOS 55 05/31/2015 0805   BILITOT 0.91 05/31/2015 0805   GFRNONAA 51 (L) 01/21/2014 0420   GFRAA 60 (L) 01/21/2014 0420    No results found for: SPEP, UPEP  Lab Results  Component Value Date   WBC 11.2 (H) 08/28/2015   NEUTROABS 2.1 08/28/2015   HGB 12.2 08/28/2015   HCT 36.0 08/28/2015   MCV 91.6 08/28/2015   PLT 136 (L) 08/28/2015      Chemistry      Component Value Date/Time   NA 139 05/31/2015 0805   K 3.8 05/31/2015 0805   CL 95 (L) 01/21/2014 0420   CL 105 05/20/2012 0823   CO2 27 05/31/2015 0805   BUN 15.0 05/31/2015 0805   CREATININE 1.0 05/31/2015 0805      Component Value Date/Time   CALCIUM 9.9 05/31/2015 0805   ALKPHOS 55 05/31/2015 0805   AST 23 05/31/2015 0805   ALT 12 05/31/2015 0805   BILITOT 0.91 05/31/2015 0805      ASSESSMENT & PLAN:  CLL (chronic lymphocytic leukemia) She is doing well. Anemia has resolved and she has improvement with rising platelet count. Apart from bruising, she has no other side effects.. We will continue same treatment for now. With stability of her blood count, I will see her back in 3 months. I plan to stage her with CT scan once her CBC  normalized  Thrombocytopenia Her white count is improving on Ibrutinib With stability of her platelet count, I recommend she continues 81 mg aspirin for stroke prevention.   Hematuria, gross She has noticed gross hematuria recently. Her treatment should not be causing this. Aspirin shouldn't cause gross hematuria. I will order urinalysis and urine culture to exclude urinary tract infection. If the test results are negative, the next plan of care would be to proceed with ultrasound of her kidneys and bladder and referred to urology  Essential hypertension Her blood pressure is very high today which she attributed that to whitecoat hypertension. According to her blood pressure monitoring at home, usually within acceptable limits. She will continue same medical management and follow closely with primary care doctor and her cardiologist for medication adjustment.   Orders Placed This Encounter  Procedures  . Urine culture    Standing Status:   Future  Number of Occurrences:   1    Standing Expiration Date:   10/01/2016  . Urinalysis, Microscopic - [facility]    Standing Status:   Future    Number of Occurrences:   1    Standing Expiration Date:   10/01/2016   All questions were answered. The patient knows to call the clinic with any problems, questions or concerns. No barriers to learning was detected. I spent 15 minutes counseling the patient face to face. The total time spent in the appointment was 20 minutes and more than 50% was on counseling and review of test results     [facility], [place], MD 08/28/2015 8:39 AM

## 2015-08-28 NOTE — Assessment & Plan Note (Signed)
Her white count is improving on Ibrutinib With stability of her platelet count, I recommend she continues 81 mg aspirin for stroke prevention.  

## 2015-08-28 NOTE — Assessment & Plan Note (Addendum)
She is doing well. Anemia has resolved and she has improvement with rising platelet count. Apart from bruising, she has no other side effects.. We will continue same treatment for now. With stability of her blood count, I will see her back in 3 months. I plan to stage her with CT scan once her CBC normalized

## 2015-08-28 NOTE — Assessment & Plan Note (Signed)
Her blood pressure is very high today which she attributed that to whitecoat hypertension. According to her blood pressure monitoring at home, usually within acceptable limits. She will continue same medical management and follow closely with primary care doctor and her cardiologist for medication adjustment.

## 2015-08-28 NOTE — Telephone Encounter (Signed)
Pt notified of message below. Has already picked up cipro. Will call if it does not clear.

## 2015-08-28 NOTE — Assessment & Plan Note (Signed)
She has noticed gross hematuria recently. Her treatment should not be causing this. Aspirin shouldn't cause gross hematuria. I will order urinalysis and urine culture to exclude urinary tract infection. If the test results are negative, the next plan of care would be to proceed with ultrasound of her kidneys and bladder and referred to urology

## 2015-08-28 NOTE — Telephone Encounter (Signed)
-----   Message from Heath Lark, MD sent at 08/28/2015  9:11 AM EDT ----- Regarding: UTI UA looked like she has UTI Can you call her and then call in cipro 250 mg BID PO X 7 days? That should treat the UTI and hematuria should resolve If it does not clear in 1 week, please call back ----- Message ----- From: Interface, Lab In Three Zero One Sent: 08/28/2015   7:58 AM To: Heath Lark, MD

## 2015-08-29 ENCOUNTER — Encounter: Payer: Self-pay | Admitting: Pharmacist

## 2015-08-30 LAB — URINE CULTURE

## 2015-08-31 ENCOUNTER — Encounter: Payer: Self-pay | Admitting: Family

## 2015-09-04 ENCOUNTER — Ambulatory Visit (INDEPENDENT_AMBULATORY_CARE_PROVIDER_SITE_OTHER): Payer: Medicare Other | Admitting: Family

## 2015-09-04 ENCOUNTER — Other Ambulatory Visit: Payer: Self-pay | Admitting: Family

## 2015-09-04 ENCOUNTER — Encounter: Payer: Self-pay | Admitting: Family

## 2015-09-04 VITALS — BP 186/70 | HR 68 | Temp 97.2°F | Resp 18 | Ht 60.0 in | Wt 104.2 lb

## 2015-09-04 DIAGNOSIS — I6522 Occlusion and stenosis of left carotid artery: Secondary | ICD-10-CM | POA: Diagnosis not present

## 2015-09-04 DIAGNOSIS — I6529 Occlusion and stenosis of unspecified carotid artery: Secondary | ICD-10-CM

## 2015-09-04 DIAGNOSIS — Z9889 Other specified postprocedural states: Secondary | ICD-10-CM

## 2015-09-04 DIAGNOSIS — I25709 Atherosclerosis of coronary artery bypass graft(s), unspecified, with unspecified angina pectoris: Secondary | ICD-10-CM

## 2015-09-04 NOTE — Patient Instructions (Signed)
Stroke Prevention Some medical conditions and behaviors are associated with an increased chance of having a stroke. You may prevent a stroke by making healthy choices and managing medical conditions. HOW CAN I REDUCE MY RISK OF HAVING A STROKE?   Stay physically active. Get at least 30 minutes of activity on most or all days.  Do not smoke. It may also be helpful to avoid exposure to secondhand smoke.  Limit alcohol use. Moderate alcohol use is considered to be:  No more than 2 drinks per day for men.  No more than 1 drink per day for nonpregnant women.  Eat healthy foods. This involves:  Eating 5 or more servings of fruits and vegetables a day.  Making dietary changes that address high blood pressure (hypertension), high cholesterol, diabetes, or obesity.  Manage your cholesterol levels.  Making food choices that are high in fiber and low in saturated fat, trans fat, and cholesterol may control cholesterol levels.  Take any prescribed medicines to control cholesterol as directed by your health care provider.  Manage your diabetes.  Controlling your carbohydrate and sugar intake is recommended to manage diabetes.  Take any prescribed medicines to control diabetes as directed by your health care provider.  Control your hypertension.  Making food choices that are low in salt (sodium), saturated fat, trans fat, and cholesterol is recommended to manage hypertension.  Ask your health care provider if you need treatment to lower your blood pressure. Take any prescribed medicines to control hypertension as directed by your health care provider.  If you are 18-39 years of age, have your blood pressure checked every 3-5 years. If you are 40 years of age or older, have your blood pressure checked every year.  Maintain a healthy weight.  Reducing calorie intake and making food choices that are low in sodium, saturated fat, trans fat, and cholesterol are recommended to manage  weight.  Stop drug abuse.  Avoid taking birth control pills.  Talk to your health care provider about the risks of taking birth control pills if you are over 35 years old, smoke, get migraines, or have ever had a blood clot.  Get evaluated for sleep disorders (sleep apnea).  Talk to your health care provider about getting a sleep evaluation if you snore a lot or have excessive sleepiness.  Take medicines only as directed by your health care provider.  For some people, aspirin or blood thinners (anticoagulants) are helpful in reducing the risk of forming abnormal blood clots that can lead to stroke. If you have the irregular heart rhythm of atrial fibrillation, you should be on a blood thinner unless there is a good reason you cannot take them.  Understand all your medicine instructions.  Make sure that other conditions (such as anemia or atherosclerosis) are addressed. SEEK IMMEDIATE MEDICAL CARE IF:   You have sudden weakness or numbness of the face, arm, or leg, especially on one side of the body.  Your face or eyelid droops to one side.  You have sudden confusion.  You have trouble speaking (aphasia) or understanding.  You have sudden trouble seeing in one or both eyes.  You have sudden trouble walking.  You have dizziness.  You have a loss of balance or coordination.  You have a sudden, severe headache with no known cause.  You have new chest pain or an irregular heartbeat. Any of these symptoms may represent a serious problem that is an emergency. Do not wait to see if the symptoms will   go away. Get medical help at once. Call your local emergency services (911 in [place] Do not drive yourself to the hospital.   This information is not intended to replace advice given to you by your health care provider. Make sure you discuss any questions you have with your health care provider.   Document Released: 01/31/2004 Document Revised: 01/13/2014 Document Reviewed:  06/25/2012 Elsevier Interactive Patient Education 2016 Elsevier Inc.  

## 2015-09-04 NOTE — Progress Notes (Signed)
Chief Complaint: Follow up Extracranial Carotid Artery Stenosis   History of Present Illness  Nicole Mercado is a 75 y.o. female patient of Dr. Donnetta Hutching who presents today for followup of her left carotid endarterectomy in June of 2013.   Patient has no history of TIA or stroke. Specifically she denies a history of amaurosis fugax or monocular blindness, unilateral facial drooping, hemiplegia, or receptive or expressive aphasia.    Her left thigh claudication has resolved since the CABG.  Pt reports New Medical or Surgical History: CABG x 4 vessels January 2016 with aortic valve replacement.  Pt states Dr. Burt Knack is working on getting her blood pressure better controlled.  She is wearing a heart monitor now for syncope evaluation.  Pt Diabetic: No Pt smoker: non-smoker  Pt meds include: Statin : yes  ASA: Yes Other anticoagulants/antiplatelets: no    Past Medical History:  Diagnosis Date  . Aortic stenosis    a. Echocardiogram (01/11/14):  Mild LVH, EF 60-65%, Gr 1 DD, possible bicuspid AV, severe AS (mean 61 mmHg, peak 104 mmHg), mild MVP of post leaflet, mild MR, PASP 33 mmHg.;  b. 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 Arthritis   . Atrial fibrillation ([place])    post op after CABG+AVR >> Amiodarone  . CAD (coronary artery disease)    a. LHC (01/13/14):  dLM 70 extending into oLAD, mLAD 40-50, pD1 80, pD2 70, ostial/prox OM1 70 >> CABG (L-LAD, S-OM1, S-D1, S-D2)    . Carotid artery occlusion    a. s/p L CEA 2013;  b.  Carotid US (1/16):  Bilateral ICA 1-39%  . CLL (chronic lymphocytic leukemia) (HCC)    slow leukemia---Dr  Murinson  . H/O exercise stress test    NSSTT  . Hx of cardiovascular stress test    a. Nuclear stress test That (4/13): Normal perfusion, EF 74%  . Hx of echocardiogram    Echo (2/16):  Mild LVH, EF 60-65%, no RWMA, Gr 2 DD, AVR ok (mean 9 mmHg), trivial AI, mild MR, mild LAE, PASP 40 mmHg  . Hypertension   . Pancreatitis    h/o  . Thrombocytopenia  ([place]) 11/19/2010    Social History Social History  Substance Use Topics  . Smoking status: Never Smoker  . Smokeless tobacco: Never Used  . Alcohol use No    Family History Family History  Problem Relation Age of Onset  . Heart disease Mother     in her 11s  . Hypertension Mother   . Heart attack Mother   . Heart disease Father   . Hypertension Father   . Hypertension Sister   . Hypertension Son   . Stroke Paternal Grandmother   . Stroke Paternal Grandfather     Surgical History Past Surgical History:  Procedure Laterality Date  . ABDOMINAL HYSTERECTOMY    . AORTIC VALVE REPLACEMENT [name] 01/17/2014   Procedure: AORTIC VALVE REPLACEMENT (AVR);  Surgeon: Gaye Pollack, MD;  Location: [place];  Service: Open Heart Surgery;  Laterality: [name];  . APPENDECTOMY    . CAROTID ENDARTERECTOMY  06/09/11   LEFT  cea  . CESAREAN SECTION     FIVE  . CHOLECYSTECTOMY    . CORONARY ARTERY BYPASS GRAFT [name] 01/17/2014   Procedure: CORONARY ARTERY BYPASS GRAFTING (CABG)TIMES FOUR USING LEFT INTERNAL MAMMARY ARTERY AND RIGHT SAPHENOUS VEIN HARVESTED ENDOSCOPICALLY;  Surgeon: Gaye Pollack, MD;  Location: [place];  Service: Open Heart Surgery;  Laterality: [name];  . ENDARTERECTOMY  06/09/2011  Procedure: ENDARTERECTOMY CAROTID;  Surgeon: Rosetta Posner, MD;  Location: [facility] OR;  Service: Vascular;  Laterality: Left;  left carotid endarterectomy with patch angioplasty  . LEFT AND RIGHT HEART CATHETERIZATION WITH CORONARY ANGIOGRAM [name] 01/13/2014   Procedure: LEFT AND RIGHT HEART CATHETERIZATION WITH CORONARY ANGIOGRAM;  Surgeon: Jettie Booze, MD;  Location: [facility] CATH LAB;  Service: Cardiovascular;  Laterality: [name];  . TEE WITHOUT CARDIOVERSION [name] 01/17/2014   Procedure: TRANSESOPHAGEAL ECHOCARDIOGRAM (TEE);  Surgeon: Gaye Pollack, MD;  Location: [place];  Service: Open Heart Surgery;  Laterality: [name];  . TONSILLECTOMY      No Known Allergies  Current Outpatient Prescriptions  Medication Sig Dispense  Refill  . acetaminophen (TYLENOL) 325 MG tablet Take 650 mg by mouth every 6 (six) hours as needed for mild pain or headache.     Marland Kitchen amLODipine (NORVASC) 5 MG tablet Take 1 tablet (5 mg total) by mouth daily. 90 tablet 1  . amoxicillin (AMOXIL) 500 MG tablet Take 4 tablets by mouth one hour prior to dental procedure 8 tablet 2  . aspirin 81 MG tablet Take 81 mg by mouth daily.    Marland Kitchen atorvastatin (LIPITOR) 10 MG tablet TAKE ONE TABLET BY MOUTH ONCE DAILY AT  6PM 90 tablet 3  . ibrutinib (IMBRUVICA) 140 MG capsul Take 3 capsules (420 mg total) by mouth daily. 90 capsule 6  . lisinopril (PRINIVIL,ZESTRIL) 20 MG tablet Take 1 tablet (20 mg total) by mouth daily. 90 tablet 1  . metoprolol succinate (TOPROL-XL) 50 MG 24 hr tablet Take 1 tablet (50 mg total) by mouth daily. Take with or immediately following a meal. 90 tablet 1   No current facility-administered medications for this visit.     Review of Systems : See HPI for pertinent positives and negatives.  Physical Examination  Vitals:   09/04/15 0849  BP: (!) 186/70  Pulse: 68  Resp: 18  Temp: 97.2 F (36.2 C)  SpO2: 99%  Weight: 104 lb 3.2 oz (47.3 kg)  Height: 5' (1.524 m)   Body mass index is 20.35 kg/m.  General: WDWN female in NAD GAIT: normal Eyes: PERRLA Pulmonary: Respirations are non-labored, CTAB, Negative Rales, Negative rhonchi, & Negative wheezing.  Cardiac: regular rhythm, + murmur.  VASCULAR EXAM Carotid Bruits Right Left   Transmitted cardiac murmur transmitted cardiac murmur  Aorta is not palpable  Radial pulses are 1+ palpable and equal.      LE Pulses Right Left   FEMORAL 2+ palpable 2+ palpable    POPLITEAL not palpable  not palpable   POSTERIOR TIBIAL  palpable  not palpable    DORSALIS PEDIS  ANTERIOR TIBIAL   palpable   palpable     Gastrointestinal: soft, nontender, BS WNL, no r/g,no palpable masses.  Musculoskeletal: No muscle atrophy/wasting. M/S 4/5 throughout, Extremities without ischemic changes. Feet appear well perfused.  Neurologic: A&O X 3; Appropriate Affect, Speech is normal CN 2-12 intact, Pain and light touch intact in extremities, Motor exam as listed above      Assessment: Nicole Mercado is a 75 y.o. female female who is s/p left carotid endarterectomy in June of 2013.  She has no history of stroke or TIA.  DATA 08/28/15 carotid Duplex suggests <40% stenosis of the right internal carotid artery and a patent left carotid endarterectomy with minimal (<40%) hyperplasia at the distal patch.  No significant change compared to prior exam on 08/18/13 and 08/23/14.  Plan: Follow-up in 1 year with Carotid Duplex scan.  I discussed in depth with the patient the nature of atherosclerosis, and emphasized the importance of maximal medical management including strict control of blood pressure, blood glucose, and lipid levels, obtaining regular exercise, and continued cessation of smoking.  The patient is aware that without maximal medical management the underlying atherosclerotic disease process will progress, limiting the benefit of any interventions. The patient was given information about stroke prevention and what symptoms should prompt the patient to seek immediate medical care. Thank you for allowing [place] to participate in this patient's care.  Clemon Chambers, RN, MSN, FNP-C [facility] Office: [phone]  Clinic Physician: Early  09/04/15 9:21 AM

## 2015-09-05 ENCOUNTER — Telehealth: Payer: Self-pay | Admitting: Hematology and Oncology

## 2015-09-05 NOTE — Telephone Encounter (Signed)
CALLED PATIENT TO CONFRM APPT. L/M APPT LTR AND SCHD MAILED.

## 2015-09-11 ENCOUNTER — Other Ambulatory Visit: Payer: Self-pay | Admitting: Cardiovascular Disease

## 2015-09-11 DIAGNOSIS — I10 Essential (primary) hypertension: Secondary | ICD-10-CM

## 2015-09-17 ENCOUNTER — Other Ambulatory Visit (HOSPITAL_COMMUNITY): Payer: Medicare Other

## 2015-09-24 ENCOUNTER — Ambulatory Visit (HOSPITAL_COMMUNITY): Payer: Medicare Other | Attending: Cardiology

## 2015-09-24 ENCOUNTER — Other Ambulatory Visit: Payer: Self-pay

## 2015-09-24 DIAGNOSIS — I359 Nonrheumatic aortic valve disorder, unspecified: Secondary | ICD-10-CM | POA: Diagnosis not present

## 2015-09-24 DIAGNOSIS — I517 Cardiomegaly: Secondary | ICD-10-CM | POA: Insufficient documentation

## 2015-09-24 DIAGNOSIS — I351 Nonrheumatic aortic (valve) insufficiency: Secondary | ICD-10-CM | POA: Diagnosis not present

## 2015-09-26 MED FILL — *IMBRUVICA 140 MG CAPSULE: 140 | 30 days supply | Qty: 90 | Fill #4

## 2015-10-01 NOTE — Progress Notes (Signed)
Cardiology Office Note:    Date:  10/02/2015   ID:  Nicole Mercado, DOB [birth year], MRN [id number]  PCP:  Nicole Frames, MD  Cardiologist:  Dr. Sherren Mercado   Electrophysiologist:  n/a  Referring MD: Nicole Cowman, MD   Chief Complaint  Patient presents with  . Follow-up    Syncope    History of Present Illness:    Nicole Mercado is a 75 y.o. female with a hx of CLL, chronic thrombocytopenia, HTN, carotid stenosis, s/p L CEA 6/13, severe AS, CAD s/p CABG+bioprosthetic AVR in 1/16 (Dr. Cyndia Mercado - L-LAD, S-OM1, S-D1, S-D2)  She presented with syncope in 1/16 prior to her AVR.  Last seen by Dr. Sherren Mercado in 6/17.  Echo in 9/16 and demonstrated mild perivalvular aortic insufficiency. Repeat echo was recommended in 9/17.  She was seen as an add-on by Dr. Marlou Mercado 08/20/15 for syncope. This occurred while in the bathroom. She did have a drop in her blood pressure from lying to standing in the office (177 >> 135).  Event monitor was arranged. This demonstrated no arrhythmias.  FU Echo demonstrated normal LVEF, mod diastolic dysfunction, mild perivalvular AI and mild MR.  She returns for FU.    She has not had a recurrence of syncope since last seen. She does note that she was standing in the bathroom when this occurred. She felt dizzy prior to losing consciousness. She denies any chest discomfort. She denies shortness of breath. She denies orthopnea, PND or edema. She is on chemotherapy for her CLL. The medication is Ibrutinib.  It can cause HTN and dehydration.  However, she has been on this for about a year without problems.  Hgb in 8/17 was normal.    Prior CV studies that were reviewed today include:    Echo 09/24/15  Mild concentric LVH, EF 60-65%, normal wall motion, grade 2 diastolic dysfunction, bioprosthetic AVR with mild AI, mean gradient 12 mmHg, mild MR, mild LAE, PASP 35 mmHg  Event Monitor 08/23/15  Rare PACs  No significant bradycardia, no pauses, no atrial fibrillation   No adverse arrhythmias identified  Carotid US 8/17 L CEA patent, R 1-39%  Echo 9/16 EF 55-60%, normal wall motion, grade 1 diastolic dysfunction, bioprosthetic AVR, mild perivalvular AI, mean gradient 9 mmHg, PASP 23 mmHg  LHC (01/13/14):   dLM 70 extending into oLAD, mLAD 40-50, pD1 80, pD2 70, ostial/prox OM1 70  Echocardiogram (01/11/14):   Mild LVH, EF 60-65%, Gr 1 DD, possible bicuspid AV, severe AS (mean 61 mmHg, peak 104 mmHg), mild MVP of post leaflet, mild MR, PASP 33 mmHg.  Carotid US (8/15):   R < 40%, prox R ECA occluded, L CEA ok with velocities suggesting < 40%  Carotid US (1/16):   Bilateral ICA 1-39%  Nuclear stress test (4/13):  Normal perfusion, EF 74%  Past Medical History:  Diagnosis Date  . Aortic stenosis    a. Echocardiogram (01/11/14):  Mild LVH, EF 60-65%, Gr 1 DD, possible bicuspid AV, severe AS (mean 61 mmHg, peak 104 mmHg), mild MVP of post leaflet, mild MR, PASP 33 mmHg.;  b. 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 Arthritis   . Atrial fibrillation (Nicole Mercado)    post op after CABG+AVR >> Amiodarone  . CAD (coronary artery disease)    a. LHC (01/13/14):  dLM 70 extending into oLAD, mLAD 40-50, pD1 80, pD2 70, ostial/prox OM1 70 >> CABG (L-LAD, S-OM1, S-D1, S-D2)    . Carotid artery occlusion  a. s/p L CEA 2013;  b.  Carotid US (1/16):  Bilateral ICA 1-39%  . CLL (chronic lymphocytic leukemia) (HCC)    slow leukemia---Dr  Nicole Mercado  . H/O exercise stress test    NSSTT  . Hx of cardiovascular stress test    a. Nuclear stress test That (4/13): Normal perfusion, EF 74%  . Hx of echocardiogram    Echo (2/16):  Mild LVH, EF 60-65%, no RWMA, Gr 2 DD, AVR ok (mean 9 mmHg), trivial AI, mild MR, mild LAE, PASP 40 mmHg  . Hypertension   . Pancreatitis    h/o  . Thrombocytopenia ([place]) 11/19/2010    Past Surgical History:  Procedure Laterality Date  . ABDOMINAL HYSTERECTOMY    . AORTIC VALVE REPLACEMENT [name] 01/17/2014   Procedure: AORTIC VALVE REPLACEMENT (AVR);   Surgeon: Nicole Pollack, MD;  Location: [place];  Service: Open Heart Surgery;  Laterality: [name];  . APPENDECTOMY    . CAROTID ENDARTERECTOMY  06/09/11   LEFT  cea  . CESAREAN SECTION     FIVE  . CHOLECYSTECTOMY    . CORONARY ARTERY BYPASS GRAFT [name] 01/17/2014   Procedure: CORONARY ARTERY BYPASS GRAFTING (CABG)TIMES FOUR USING LEFT INTERNAL MAMMARY ARTERY AND RIGHT SAPHENOUS VEIN HARVESTED ENDOSCOPICALLY;  Surgeon: Nicole Pollack, MD;  Location: [place];  Service: Open Heart Surgery;  Laterality: [name];  . ENDARTERECTOMY  06/09/2011   Procedure: ENDARTERECTOMY CAROTID;  Surgeon: Nicole Posner, MD;  Location: [facility] OR;  Service: Vascular;  Laterality: Left;  left carotid endarterectomy with patch angioplasty  . LEFT AND RIGHT HEART CATHETERIZATION WITH CORONARY ANGIOGRAM [name] 01/13/2014   Procedure: LEFT AND RIGHT HEART CATHETERIZATION WITH CORONARY ANGIOGRAM;  Surgeon: Nicole Booze, MD;  Location: [facility] CATH LAB;  Service: Cardiovascular;  Laterality: [name];  . TEE WITHOUT CARDIOVERSION [name] 01/17/2014   Procedure: TRANSESOPHAGEAL ECHOCARDIOGRAM (TEE);  Surgeon: Nicole Pollack, MD;  Location: [place];  Service: Open Heart Surgery;  Laterality: [name];  . TONSILLECTOMY      Current Medications: Outpatient Medications Prior to Visit  Medication Sig Dispense Refill  . acetaminophen (TYLENOL) 325 MG tablet Take 650 mg by mouth every 6 (six) hours as needed for mild pain or headache.     Marland Kitchen amLODipine (NORVASC) 5 MG tablet TAKE ONE TABLET BY MOUTH ONCE DAILY 90 tablet 3  . amoxicillin (AMOXIL) 500 MG tablet Take 4 tablets by mouth one hour prior to dental procedure 8 tablet 2  . aspirin 81 MG tablet Take 81 mg by mouth daily.    Marland Kitchen atorvastatin (LIPITOR) 10 MG tablet TAKE ONE TABLET BY MOUTH ONCE DAILY AT  6PM 90 tablet 3  . ibrutinib (IMBRUVICA) 140 MG capsul Take 3 capsules (420 mg total) by mouth daily. 90 capsule 6  . lisinopril (PRINIVIL,ZESTRIL) 20 MG tablet TAKE ONE TABLET BY MOUTH ONCE DAILY 90 tablet 3  .  metoprolol succinate (TOPROL-XL) 50 MG 24 hr tablet TAKE ONE TABLET BY MOUTH ONCE DAILY WITH  OR  IMMEDIATELY  FOLLOWING  A  MEAL 90 tablet 3   No facility-administered medications prior to visit.       Allergies:   Review of patient's allergies indicates no known allergies.   Social History   Social History  . Marital status: Widowed    Spouse name: [name]  . Number of children: [name]  . Years of education: [name]   Social History Main Topics  . Smoking status: Never Smoker  . Smokeless tobacco: Never Used  .  Alcohol use No  . Drug use: No  . Sexual activity: Not Asked   Other Topics Concern  . None   Social History Narrative  . None     Family History:  The patient's family history includes Heart attack in her mother; Heart disease in her father and mother; Hypertension in her father, mother, sister, and son; Stroke in her paternal grandfather and paternal grandmother.   ROS:   Please see the history of present illness.    ROS All other systems reviewed and are negative.   EKGs/Labs/Other Test Reviewed:    EKG:  EKG is  ordered today.  The ekg ordered today demonstrates Sinus bradycardia, HR 54, normal axis, anteroseptal Q waves, QTc 373 ms, no change since prior tracing  Recent Labs: 05/31/2015: ALT 12; BUN 15.0; Creatinine 1.0; Potassium 3.8; Sodium 139 08/28/2015: HGB 12.2; Platelets 136   Recent Lipid Panel    Component Value Date/Time   CHOL 140 05/01/2015 0920   TRIG 115 05/01/2015 0920   HDL 69 05/01/2015 0920   CHOLHDL 2.0 05/01/2015 0920   VLDL 23 05/01/2015 0920   LDLCALC 48 05/01/2015 0920     Physical Exam:    VS:  BP (!) 196/72 (BP Location: Left Arm, Cuff Size: Normal)   Pulse 60   Ht 5' (1.524 m)   Wt 102 lb 12.8 oz (46.6 kg)   BMI 20.08 kg/m     Orthostatic VS for the past 24 hrs (Last 3 readings):  BP- Lying Pulse- Lying BP- Sitting Pulse- Sitting BP- Standing at 0 minutes Pulse- Standing at 0 minutes BP- Standing at 3 minutes Pulse- Standing  at 3 minutes  10/02/15 1012 190/67 53 180/67 54 190/65 56 196/72 60    Wt Readings from Last 3 Encounters:  10/02/15 102 lb 12.8 oz (46.6 kg)  09/04/15 104 lb 3.2 oz (47.3 kg)  08/28/15 104 lb 3.2 oz (47.3 kg)     Physical Exam  Constitutional: She is oriented to person, place, and time. She appears well-developed and well-nourished. No distress.  HENT:  Head: Normocephalic and atraumatic.  Eyes: No scleral icterus.  Neck: No JVD present.  Cardiovascular: Normal rate and regular rhythm.   Murmur heard.  Low-pitched systolic murmur is present with a grade of 1/6  at the lower left sternal border  Diastolic murmur is present with a grade of 2/6  at the lower left sternal border Pulmonary/Chest: Effort normal. She has no wheezes. She has no rales.  Abdominal: Soft. There is no tenderness.  Musculoskeletal: She exhibits no edema.  Neurological: She is alert and oriented to person, place, and time.  Skin: Skin is warm and dry.  Psychiatric: She has a normal mood and affect.    ASSESSMENT:    1. Syncope, unspecified syncope type   2. Coronary artery disease involving native coronary artery of native heart without angina pectoris   3. S/P AVR   4. Bilateral carotid artery disease ([place])   5. Essential hypertension   6. HLD (hyperlipidemia)    PLAN:    In order of problems listed above:  1. Syncope - This was likely vasovagal.  She was orthostatic when she was seen last month.  She is no longer orthostatic today.  She had symptoms of dizziness prior to passing out 6 weeks ago.  She has a normal EF and an unremarkable event monitor.  I d/w Dr. Marlou Mercado who saw her last time. The testing is all very reassuring and we think  it is ok for her to resume driving.  2. CAD - s/p CABG.  No angina. Continue ASA, statin, beta-blocker.  3. S/p AVR - She just had a FU echo this month with normally functioning AVR and just mild perivalvular AI.  Continue SBE prophylaxis.  4. Carotid artery  disease - Followed by [facility].  5. HTN - BP elevated. She has better BPs at home.  I have asked her to check her BP several times a week for 2 weeks and send me the readings.  6. HL -  LDL was 48 in 4/17.  Continue statin.    Medication Adjustments/Labs and Tests Ordered: Current medicines are reviewed at length with the patient today.  Concerns regarding medicines are outlined above.  Medication changes, Labs and Tests ordered today are outlined in the Patient Instructions noted below. Patient Instructions  Medication Instructions:  Your physician recommends that you continue on your current medications as directed. Please refer to the Current Medication list given to you today.Worthy Keeler: NONE  Testing/Procedures: NONE  Follow-Up: Your physician wants you to follow-up in: 6 MONTHS WITH DR. Emelda Fear will receive a reminder letter in the mail two months in advance. If you don't receive a letter, please call our office to schedule the follow-up appointment.  Any Other Special Instructions Will Be Listed Below (If Applicable). CHECK BLOOD PRESSURE A FEW TIMES A WEEK FOR 2 WEEKS THEN SEND OR CALL READINGS TO [place], [place]  If you need a refill on your cardiac medications before your next appointment, please call your pharmacy.  Signed, Richardson Dopp, PA-C  10/02/2015 10:14 AM    [facility] [place], [place], [place]  [address] Phone: [phone]; Fax: [phone]

## 2015-10-02 ENCOUNTER — Ambulatory Visit (INDEPENDENT_AMBULATORY_CARE_PROVIDER_SITE_OTHER): Payer: Medicare Other | Admitting: Physician Assistant

## 2015-10-02 ENCOUNTER — Encounter: Payer: Self-pay | Admitting: Physician Assistant

## 2015-10-02 VITALS — BP 196/72 | HR 60 | Ht 60.0 in | Wt 102.8 lb

## 2015-10-02 DIAGNOSIS — I2510 Atherosclerotic heart disease of native coronary artery without angina pectoris: Secondary | ICD-10-CM

## 2015-10-02 DIAGNOSIS — Z952 Presence of prosthetic heart valve: Secondary | ICD-10-CM

## 2015-10-02 DIAGNOSIS — R55 Syncope and collapse: Secondary | ICD-10-CM

## 2015-10-02 DIAGNOSIS — I10 Essential (primary) hypertension: Secondary | ICD-10-CM

## 2015-10-02 DIAGNOSIS — I779 Disorder of arteries and arterioles, unspecified: Secondary | ICD-10-CM | POA: Diagnosis not present

## 2015-10-02 DIAGNOSIS — E785 Hyperlipidemia, unspecified: Secondary | ICD-10-CM

## 2015-10-02 DIAGNOSIS — I739 Peripheral vascular disease, unspecified: Secondary | ICD-10-CM

## 2015-10-02 DIAGNOSIS — Z954 Presence of other heart-valve replacement: Secondary | ICD-10-CM | POA: Diagnosis not present

## 2015-10-02 NOTE — Patient Instructions (Addendum)
Medication Instructions:  Your physician recommends that you continue on your current medications as directed. Please refer to the Current Medication list given to you today.Nicole Mercado: NONE  Testing/Procedures: NONE  Follow-Up: Your physician wants you to follow-up in: 6 MONTHS WITH DR. Emelda Fear will receive a reminder letter in the mail two months in advance. If you don't receive a letter, please call our office to schedule the follow-up appointment.  Any Other Special Instructions Will Be Listed Below (If Applicable). CHECK BLOOD PRESSURE A FEW TIMES A WEEK FOR 2 WEEKS THEN SEND OR CALL READINGS TO [place], [place]  If you need a refill on your cardiac medications before your next appointment, please call your pharmacy.

## 2015-10-08 ENCOUNTER — Encounter: Payer: Self-pay | Admitting: Cardiovascular Disease

## 2015-10-10 ENCOUNTER — Other Ambulatory Visit: Payer: Self-pay

## 2015-10-10 DIAGNOSIS — I10 Essential (primary) hypertension: Secondary | ICD-10-CM

## 2015-10-10 MED ORDER — AMLODIPINE BESYLATE 10 MG PO TABS: 10.0000 mg | ORAL_TABLET | Freq: Every day | ORAL | 3 refills | Status: DC

## 2015-10-26 MED FILL — IMBRUVICA 140 MG CAPSULE: 140 | 30 days supply | Qty: 90 | Fill #5

## 2015-11-16 ENCOUNTER — Encounter: Payer: Self-pay | Admitting: Cardiovascular Disease

## 2015-11-19 ENCOUNTER — Telehealth: Payer: Self-pay | Admitting: Cardiovascular Disease

## 2015-11-19 DIAGNOSIS — I10 Essential (primary) hypertension: Secondary | ICD-10-CM

## 2015-11-19 NOTE — Telephone Encounter (Signed)
Follow Up:    Please call,concerning her Amlodipine.

## 2015-11-19 NOTE — Telephone Encounter (Signed)
Per My Chart message:  Nicole Mercado  to Sherren Mocha, MD       11/16/15 4:32 PM  , [name] wanted to let you know how the good old blood pressure doing.  On 10/20 it was 147/ 72.  163/56. And on 10/23, 171/52 , 159/60, 156/58. Feeling real good.     I spoke with the pt by phone and she is currently taking Amlodipine 10mg  daily, Lisinopril 20mg  daily and Metoprolol Succinate 50mg  daily.  I made the pt aware that I will have Dr Burt Knack review her readings and make further recommendations in regards to BP management.

## 2015-11-20 NOTE — Telephone Encounter (Signed)
Would add chlorthalidone 12.5 mg daily, BMET 2 weeks, can titrate to 25 mg at 2 weeks pending lab results and BP response. thanks

## 2015-11-22 MED ORDER — CHLORTHALIDONE 25 MG PO TABS: 12.5000 mg | ORAL_TABLET | Freq: Every day | ORAL | 3 refills | Status: DC

## 2015-11-22 NOTE — Telephone Encounter (Signed)
I spoke with the pt and made her aware of Dr Antionette Char recommendation.  Rx sent to the pharmacy. BMP scheduled on 12/06/15. The pt will continue to monitor her BP and in 2 weeks after lab work we will make determination if medication should be increased.

## 2015-11-26 ENCOUNTER — Other Ambulatory Visit (HOSPITAL_BASED_OUTPATIENT_CLINIC_OR_DEPARTMENT_OTHER): Payer: Medicare Other

## 2015-11-26 ENCOUNTER — Telehealth: Payer: Self-pay | Admitting: Hematology and Oncology

## 2015-11-26 ENCOUNTER — Telehealth: Payer: Self-pay | Admitting: *Deleted

## 2015-11-26 ENCOUNTER — Ambulatory Visit (HOSPITAL_BASED_OUTPATIENT_CLINIC_OR_DEPARTMENT_OTHER): Payer: Medicare Other | Admitting: Hematology and Oncology

## 2015-11-26 ENCOUNTER — Encounter: Payer: Self-pay | Admitting: Hematology and Oncology

## 2015-11-26 VITALS — BP 172/44 | HR 61 | Temp 98.4°F | Resp 14 | Ht 60.0 in | Wt 102.9 lb

## 2015-11-26 DIAGNOSIS — C9110 Chronic lymphocytic leukemia of B-cell type not having achieved remission: Secondary | ICD-10-CM

## 2015-11-26 DIAGNOSIS — I10 Essential (primary) hypertension: Secondary | ICD-10-CM | POA: Diagnosis not present

## 2015-11-26 DIAGNOSIS — D696 Thrombocytopenia, unspecified: Secondary | ICD-10-CM

## 2015-11-26 LAB — CBC WITH DIFFERENTIAL/PLATELET
BASO%: 0.3 % (ref 0.0–2.0)
Basophils Absolute: 0 10*3/uL (ref 0.0–0.1)
EOS ABS: 0.1 10*3/uL (ref 0.0–0.5)
EOS%: 0.7 % (ref 0.0–7.0)
HCT: 41.2 % (ref 34.8–46.6)
HGB: 14.1 g/dL (ref 11.6–15.9)
LYMPH%: 65.9 % — AB (ref 14.0–49.7)
MCH: 31.1 pg (ref 25.1–34.0)
MCHC: 34.2 g/dL (ref 31.5–36.0)
MCV: 90.7 fL (ref 79.5–101.0)
MONO#: 0.7 10*3/uL (ref 0.1–0.9)
MONO%: 5.1 % (ref 0.0–14.0)
NEUT%: 28 % — ABNORMAL LOW (ref 38.4–76.8)
NEUTROS ABS: 3.8 10*3/uL (ref 1.5–6.5)
PLATELETS: 137 10*3/uL — AB (ref 145–400)
RBC: 4.54 10*6/uL (ref 3.70–5.45)
RDW: 12.8 % (ref 11.2–14.5)
WBC: 13.4 10*3/uL — AB (ref 3.9–10.3)
lymph#: 8.8 10*3/uL — ABNORMAL HIGH (ref 0.9–3.3)
nRBC: 0 % (ref 0–0)

## 2015-11-26 MED FILL — IMBRUVICA 140 MG CAPSULE: 140 | 30 days supply | Qty: 90 | Fill #6

## 2015-11-26 NOTE — Telephone Encounter (Signed)
Appointments scheduled per 11/26/15 los. A copy of the AVS report & appointment schedule was given to patient,per 11/26/15 los. °

## 2015-11-26 NOTE — Telephone Encounter (Signed)
Pt will call [facility] to start process for re-enrollment

## 2015-11-26 NOTE — Assessment & Plan Note (Signed)
Her blood pressure is very high today which she attributed that to whitecoat hypertension. According to her blood pressure monitoring at home, usually within acceptable limits. She will continue same medical management and follow closely with primary care doctor and her cardiologist for medication adjustment.

## 2015-11-26 NOTE — Assessment & Plan Note (Signed)
Her white count is improving on Ibrutinib With stability of her platelet count, I recommend she continues 81 mg aspirin for stroke prevention.  

## 2015-11-26 NOTE — Assessment & Plan Note (Signed)
She is doing well. Anemia has resolved and she has improvement with rising platelet count. Apart from bruising, she has no other side effects.. We will continue same treatment for now. With stability of her blood count, I will see her back in 3 months. I plan to stage her with CT scan once her CBC normalized

## 2015-11-26 NOTE — Telephone Encounter (Signed)
-----   Message from Enis Gash, [facility] sent at 11/26/2015  9:15 AM EST ----- Appears per Laureen Abrahams note 10/31/14 that pt had copay assistance through [employer]. They do still have funds available for CLL and she should be eligible again since >21yr since last enrollment. We should start the re-enrollment process. Do you think she would be willing to call them to start her own re-application? [facility] [id number]  Denyse Amass ----- Message ----- From: Patton Salles, RN Sent: 11/26/2015   8:42 AM To: Enis Gash, RPH  Pt reports she has 1 refill  covered by [employer]. Do we need to work on renewal? She said [facility] notified her of this.Cherre Huger

## 2015-11-26 NOTE — Progress Notes (Signed)
[place] OFFICE PROGRESS NOTE  Patient Care Team: Kristie Cowman, MD as PCP - General (Family Medicine) Kristie Cowman, MD (Family Medicine) Terance Ice, MD (Cardiology) Sherren Mocha, MD as Consulting Physician (Cardiology)  SUMMARY OF ONCOLOGIC HISTORY:   CLL (chronic lymphocytic leukemia) ([place])   03/21/2011 Initial Diagnosis    CLL (chronic lymphocytic leukemia)      01/14/2014 - 01/31/2014 Hospital Admission    The patient was hospitalized and was found to severe aortic stenosis causing syncopal episode. She received treatment with steroids with minimum success and required platelet transfusion.      09/18/2014 Imaging    CT scan of the chest, abdomen and pelvis show lymphadenopathy but low level burden of disease.      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 There was no stenosis. Mild peri-valvular aortic insufficiency is noted      11/04/2014 -  Chemotherapy    She is started on Ibrutinib       INTERVAL HISTORY: Please see below for problem oriented charting. She returns for follow-up. She continues to complain of bruising. The patient denies any recent signs or symptoms of bleeding such as spontaneous epistaxis, hematuria or hematochezia. She denies recent infection. No recent diarrhea  REVIEW OF SYSTEMS:   Constitutional: Denies fevers, chills or abnormal weight loss Eyes: Denies blurriness of vision Ears, nose, mouth, throat, and face: Denies mucositis or sore throat Respiratory: Denies cough, dyspnea or wheezes Cardiovascular: Denies palpitation, chest discomfort or lower extremity swelling Gastrointestinal:  Denies nausea, heartburn or change in bowel habits Skin: Denies abnormal skin rashes Lymphatics: Denies new lymphadenopathy or easy bruising Neurological:Denies numbness, tingling or new  weaknesses Behavioral/Psych: Mood is stable, no new changes  All other systems were reviewed with the patient and are negative.  I have reviewed the past medical history, past surgical history, social history and family history with the patient and they are unchanged from previous note.  ALLERGIES:  has No Known Allergies.  MEDICATIONS:  Current Outpatient Prescriptions  Medication Sig Dispense Refill  . acetaminophen (TYLENOL) 325 MG tablet Take 650 mg by mouth every 6 (six) hours as needed for mild pain or headache.     Marland Kitchen amLODipine (NORVASC) 10 MG tablet Take 1 tablet (10 mg total) by mouth daily. 90 tablet 3  . amoxicillin (AMOXIL) 500 MG tablet Take 4 tablets by mouth one hour prior to dental procedure 8 tablet 2  . aspirin 81 MG tablet Take 81 mg by mouth daily.    Marland Kitchen atorvastatin (LIPITOR) 10 MG tablet TAKE ONE TABLET BY MOUTH ONCE DAILY AT  6PM 90 tablet 3  . chlorthalidone (HYGROTON) 25 MG tablet Take 0.5 tablets (12.5 mg total) by mouth daily. 90 tablet 3  . ibrutinib (IMBRUVICA) 140 MG capsul Take 3 capsules (420 mg total) by mouth daily. 90 capsule 6  . lisinopril (PRINIVIL,ZESTRIL) 20 MG tablet TAKE ONE TABLET BY MOUTH ONCE DAILY 90 tablet 3  . metoprolol succinate (TOPROL-XL) 50 MG 24 hr tablet TAKE ONE TABLET BY MOUTH ONCE DAILY WITH  OR  IMMEDIATELY  FOLLOWING  A  MEAL 90 tablet 3   No current facility-administered medications for this visit.     PHYSICAL EXAMINATION: ECOG PERFORMANCE STATUS: 0 - Asymptomatic  Vitals:   11/26/15 0831  BP: (!) 172/44  Pulse: 61  Resp: 14  Temp: 98.4  F (36.9 C)   Filed Weights   11/26/15 0831  Weight: 102 lb 14.4 oz (46.7 kg)    GENERAL:alert, no distress and comfortable SKIN: skin color, texture, turgor are normal, no rashes or significant lesions. Noted minor bruising EYES: normal, Conjunctiva are pink and non-injected, sclera clear Musculoskeletal:no cyanosis of digits and no clubbing  NEURO: alert & oriented x 3 with  fluent speech, no focal motor/sensory deficits  LABORATORY DATA:  I have reviewed the data as listed    Component Value Date/Time   NA 139 05/31/2015 0805   K 3.8 05/31/2015 0805   CL 95 (L) 01/21/2014 0420   CL 105 05/20/2012 0823   CO2 27 05/31/2015 0805   GLUCOSE 102 05/31/2015 0805   GLUCOSE 70 05/20/2012 0823   BUN 15.0 05/31/2015 0805   CREATININE 1.0 05/31/2015 0805   CALCIUM 9.9 05/31/2015 0805   PROT 6.2 (L) 05/31/2015 0805   ALBUMIN 3.7 05/31/2015 0805   AST 23 05/31/2015 0805   ALT 12 05/31/2015 0805   ALKPHOS 55 05/31/2015 0805   BILITOT 0.91 05/31/2015 0805   GFRNONAA 51 (L) 01/21/2014 0420   GFRAA 60 (L) 01/21/2014 0420    No results found for: SPEP, UPEP  Lab Results  Component Value Date   WBC 13.4 (H) 11/26/2015   NEUTROABS 3.8 11/26/2015   HGB 14.1 11/26/2015   HCT 41.2 11/26/2015   MCV 90.7 11/26/2015   PLT 137 (L) 11/26/2015      Chemistry      Component Value Date/Time   NA 139 05/31/2015 0805   K 3.8 05/31/2015 0805   CL 95 (L) 01/21/2014 0420   CL 105 05/20/2012 0823   CO2 27 05/31/2015 0805   BUN 15.0 05/31/2015 0805   CREATININE 1.0 05/31/2015 0805      Component Value Date/Time   CALCIUM 9.9 05/31/2015 0805   ALKPHOS 55 05/31/2015 0805   AST 23 05/31/2015 0805   ALT 12 05/31/2015 0805   BILITOT 0.91 05/31/2015 0805      ASSESSMENT & PLAN:  CLL (chronic lymphocytic leukemia) She is doing well. Anemia has resolved and she has improvement with rising platelet count. Apart from bruising, she has no other side effects.. We will continue same treatment for now. With stability of her blood count, I will see her back in 3 months. I plan to stage her with CT scan once her CBC normalized  Thrombocytopenia Her white count is improving on Ibrutinib With stability of her platelet count, I recommend she continues 81 mg aspirin for stroke prevention.   Essential hypertension Her blood pressure is very high today which she attributed  that to whitecoat hypertension. According to her blood pressure monitoring at home, usually within acceptable limits. She will continue same medical management and follow closely with primary care doctor and her cardiologist for medication adjustment.   Orders Placed This Encounter  Procedures  . CBC with Differential/Platelet    Standing Status:   Standing    Number of Occurrences:   22    Standing Expiration Date:   11/25/2016   All questions were answered. The patient knows to call the clinic with any problems, questions or concerns. No barriers to learning was detected. I spent 15 minutes counseling the patient face to face. The total time spent in the appointment was 20 minutes and more than 50% was on counseling and review of test results     Heath Lark, MD 11/26/2015 10:43 AM

## 2015-11-27 ENCOUNTER — Encounter: Payer: Self-pay | Admitting: Hematology and Oncology

## 2015-11-27 NOTE — Progress Notes (Signed)
Received signed physician form for [facility].  Faxed to [facility]. Fax received ok per confirmation sheet. 

## 2015-12-06 ENCOUNTER — Other Ambulatory Visit: Payer: Medicare Other | Admitting: *Deleted

## 2015-12-06 DIAGNOSIS — I10 Essential (primary) hypertension: Secondary | ICD-10-CM

## 2015-12-06 LAB — BASIC METABOLIC PANEL WITH GFR
BUN: 23 mg/dL (ref 7–25)
CO2: 29 mmol/L (ref 20–31)
Calcium: 9.9 mg/dL (ref 8.6–10.4)
Chloride: 96 mmol/L — ABNORMAL LOW (ref 98–110)
Creat: 1.21 mg/dL — ABNORMAL HIGH (ref 0.60–0.93)
Glucose, Bld: 90 mg/dL (ref 65–99)
Potassium: 3.9 mmol/L (ref 3.5–5.3)
Sodium: 134 mmol/L — ABNORMAL LOW (ref 135–146)

## 2015-12-07 ENCOUNTER — Telehealth: Payer: Self-pay | Admitting: Cardiovascular Disease

## 2015-12-07 NOTE — Telephone Encounter (Signed)
Mrs. Nicole Mercado is wanting you to give her a call regarding a message in her my chart . Please call.. Thanks

## 2015-12-07 NOTE — Telephone Encounter (Signed)
I spoke with the Nicole Mercado and made her aware of preliminary BMP results. The Nicole Mercado said her computer is down so she is not able to view results in my chart.  I made her aware that I will contact her once Dr Burt Knack has finalized results.

## 2015-12-19 MED FILL — IMBRUVICA 140 MG CAPSULE: 140 | 30 days supply | Qty: 90 | Fill #0

## 2016-01-24 ENCOUNTER — Telehealth: Payer: Self-pay | Admitting: *Deleted

## 2016-01-24 MED FILL — IMBRUVICA 140 MG CAPSULE: 140 | 30 days supply | Qty: 90 | Fill #1

## 2016-01-24 NOTE — Telephone Encounter (Signed)
VM from pt states she cannot afford refill on Imbruvica.  It costs pt $196.00.   [facility] pharmacy for details.  S/w Bethena Roys and she looked into situation further and found she is eligible for grant from [employer].  They will be able to charge this co-pay to the [place].  They will contact pt when Rx is ready to pick up.  Called pt back and left her a VM informing her of above.  Asked her to call us back if any problems.

## 2016-01-25 ENCOUNTER — Telehealth: Payer: Self-pay | Admitting: *Deleted

## 2016-01-25 NOTE — Telephone Encounter (Signed)
Spoke with patient . She did not hear voice mail from [place] yesterday regarding [place] assistance. Informed her of message

## 2016-02-22 MED FILL — IMBRUVICA 140 MG CAPSULE: 140 | 30 days supply | Qty: 90 | Fill #2

## 2016-02-26 ENCOUNTER — Encounter: Payer: Self-pay | Admitting: Hematology and Oncology

## 2016-02-26 ENCOUNTER — Ambulatory Visit (HOSPITAL_BASED_OUTPATIENT_CLINIC_OR_DEPARTMENT_OTHER): Payer: Medicare Other | Admitting: Hematology and Oncology

## 2016-02-26 ENCOUNTER — Other Ambulatory Visit (HOSPITAL_BASED_OUTPATIENT_CLINIC_OR_DEPARTMENT_OTHER): Payer: Medicare Other

## 2016-02-26 ENCOUNTER — Telehealth: Payer: Self-pay | Admitting: Hematology and Oncology

## 2016-02-26 DIAGNOSIS — C9110 Chronic lymphocytic leukemia of B-cell type not having achieved remission: Secondary | ICD-10-CM

## 2016-02-26 DIAGNOSIS — I10 Essential (primary) hypertension: Secondary | ICD-10-CM | POA: Diagnosis not present

## 2016-02-26 LAB — CBC WITH DIFFERENTIAL/PLATELET
BASO%: 0.3 % (ref 0.0–2.0)
BASOS ABS: 0.1 10*3/uL (ref 0.0–0.1)
EOS%: 0.5 % (ref 0.0–7.0)
Eosinophils Absolute: 0.1 10*3/uL (ref 0.0–0.5)
HCT: 38.6 % (ref 34.8–46.6)
HGB: 13.4 g/dL (ref 11.6–15.9)
LYMPH%: 57.1 % — AB (ref 14.0–49.7)
MCH: 31.1 pg (ref 25.1–34.0)
MCHC: 34.7 g/dL (ref 31.5–36.0)
MCV: 89.6 fL (ref 79.5–101.0)
MONO#: 1.1 10*3/uL — ABNORMAL HIGH (ref 0.1–0.9)
MONO%: 7 % (ref 0.0–14.0)
NEUT#: 5.3 10*3/uL (ref 1.5–6.5)
NEUT%: 35.1 % — AB (ref 38.4–76.8)
Platelets: 171 10*3/uL (ref 145–400)
RBC: 4.31 10*6/uL (ref 3.70–5.45)
RDW: 12.5 % (ref 11.2–14.5)
WBC: 15.1 10*3/uL — ABNORMAL HIGH (ref 3.9–10.3)
lymph#: 8.6 10*3/uL — ABNORMAL HIGH (ref 0.9–3.3)

## 2016-02-26 NOTE — Assessment & Plan Note (Signed)
Her blood pressure is very high today which she attributed that to whitecoat hypertension. According to her blood pressure monitoring at home, usually within acceptable limits. She will continue same medical management and follow closely with primary care doctor and her cardiologist for medication adjustment.

## 2016-02-26 NOTE — Assessment & Plan Note (Signed)
She is doing well. Anemia has resolved and she has normal platelet count today.   She still has persistent leukocytosis, indicating residual disease Apart from minor diarrhea and bruising, she has no other side effects.. We will continue same treatment for now. With stability of her blood count, I will see her back in 3 months. I plan to stage her with CT scan once her CBC normalized

## 2016-02-26 NOTE — Telephone Encounter (Signed)
Appointments scheduled per 2/20 LOS. Patient given AVS report and calendars with future scheduled appointments. °

## 2016-02-26 NOTE — Progress Notes (Signed)
[place] OFFICE PROGRESS NOTE  Patient Care Team: Kristie Cowman, MD as PCP - General (Family Medicine) Kristie Cowman, MD (Family Medicine) Terance Ice, MD (Cardiology) Sherren Mocha, MD as Consulting Physician (Cardiology)  SUMMARY OF ONCOLOGIC HISTORY:   CLL (chronic lymphocytic leukemia) ([place])   03/21/2011 Initial Diagnosis    CLL (chronic lymphocytic leukemia)      01/14/2014 - 01/31/2014 Hospital Admission    The patient was hospitalized and was found to severe aortic stenosis causing syncopal episode. She received treatment with steroids with minimum success and required platelet transfusion.      09/18/2014 Imaging    CT scan of the chest, abdomen and pelvis show lymphadenopathy but low level burden of disease.      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 There was no stenosis. Mild peri-valvular aortic insufficiency is noted      11/04/2014 -  Chemotherapy    She is started on Ibrutinib       INTERVAL HISTORY: Please see below for problem oriented charting. She is here accompanied by her friend. She is able to tolerate Ibrutinib without problems. She had minor diarrhea and minor bruising. She denies recent infection. No recent lymphadenopathy.  REVIEW OF SYSTEMS:   Constitutional: Denies fevers, chills or abnormal weight loss Eyes: Denies blurriness of vision Ears, nose, mouth, throat, and face: Denies mucositis or sore throat Respiratory: Denies cough, dyspnea or wheezes Cardiovascular: Denies palpitation, chest discomfort or lower extremity swelling Gastrointestinal:  Denies nausea, heartburn or change in bowel habits Skin: Denies abnormal skin rashes Lymphatics: Denies new lymphadenopathy or easy bruising Neurological:Denies numbness, tingling or new weaknesses Behavioral/Psych: Mood is stable, no new changes  All  other systems were reviewed with the patient and are negative.  I have reviewed the past medical history, past surgical history, social history and family history with the patient and they are unchanged from previous note.  ALLERGIES:  has No Known Allergies.  MEDICATIONS:  Current Outpatient Prescriptions  Medication Sig Dispense Refill  . acetaminophen (TYLENOL) 325 MG tablet Take 650 mg by mouth every 6 (six) hours as needed for mild pain or headache.     Marland Kitchen amLODipine (NORVASC) 10 MG tablet Take 1 tablet (10 mg total) by mouth daily. 90 tablet 3  . amoxicillin (AMOXIL) 500 MG tablet Take 4 tablets by mouth one hour prior to dental procedure 8 tablet 2  . aspirin 81 MG tablet Take 81 mg by mouth daily.    Marland Kitchen atorvastatin (LIPITOR) 10 MG tablet TAKE ONE TABLET BY MOUTH ONCE DAILY AT  6PM 90 tablet 3  . chlorthalidone (HYGROTON) 25 MG tablet Take 0.5 tablets (12.5 mg total) by mouth daily. 90 tablet 3  . ibrutinib (IMBRUVICA) 140 MG capsul Take 3 capsules (420 mg total) by mouth daily. 90 capsule 6  . lisinopril (PRINIVIL,ZESTRIL) 20 MG tablet TAKE ONE TABLET BY MOUTH ONCE DAILY 90 tablet 3  . metoprolol succinate (TOPROL-XL) 50 MG 24 hr tablet TAKE ONE TABLET BY MOUTH ONCE DAILY WITH  OR  IMMEDIATELY  FOLLOWING  A  MEAL 90 tablet 3   No current facility-administered medications for this visit.     PHYSICAL EXAMINATION: ECOG PERFORMANCE STATUS: 0 - Asymptomatic  Vitals:   02/26/16 0838  BP: (!) 150/39  Pulse: (!) 57  Resp: 18  Temp: 97.6 F (36.4 C)  Filed Weights   02/26/16 0838  Weight: 101 lb 14.4 oz (46.2 kg)    GENERAL:alert, no distress and comfortable SKIN: skin color, texture, turgor are normal, no rashes or significant lesions EYES: normal, Conjunctiva are pink and non-injected, sclera clear OROPHARYNX:no exudate, no erythema and lips, buccal mucosa, and tongue normal  NECK: supple, thyroid normal size, non-tender, without nodularity LYMPH:  no palpable  lymphadenopathy in the cervical, axillary or inguinal LUNGS: clear to auscultation and percussion with normal breathing effort HEART: regular rate & rhythm and no murmurs and no lower extremity edema ABDOMEN:abdomen soft, non-tender and normal bowel sounds Musculoskeletal:no cyanosis of digits and no clubbing  NEURO: alert & oriented x 3 with fluent speech, no focal motor/sensory deficits  LABORATORY DATA:  I have reviewed the data as listed    Component Value Date/Time   NA 134 (L) 12/06/2015 0800   NA 139 05/31/2015 0805   K 3.9 12/06/2015 0800   K 3.8 05/31/2015 0805   CL 96 (L) 12/06/2015 0800   CL 105 05/20/2012 0823   CO2 29 12/06/2015 0800   CO2 27 05/31/2015 0805   GLUCOSE 90 12/06/2015 0800   GLUCOSE 102 05/31/2015 0805   GLUCOSE 70 05/20/2012 0823   BUN 23 12/06/2015 0800   BUN 15.0 05/31/2015 0805   CREATININE 1.21 (H) 12/06/2015 0800   CREATININE 1.0 05/31/2015 0805   CALCIUM 9.9 12/06/2015 0800   CALCIUM 9.9 05/31/2015 0805   PROT 6.2 (L) 05/31/2015 0805   ALBUMIN 3.7 05/31/2015 0805   AST 23 05/31/2015 0805   ALT 12 05/31/2015 0805   ALKPHOS 55 05/31/2015 0805   BILITOT 0.91 05/31/2015 0805   GFRNONAA 51 (L) 01/21/2014 0420   GFRAA 60 (L) 01/21/2014 0420    No results found for: SPEP, UPEP  Lab Results  Component Value Date   WBC 15.1 (H) 02/26/2016   NEUTROABS 5.3 02/26/2016   HGB 13.4 02/26/2016   HCT 38.6 02/26/2016   MCV 89.6 02/26/2016   PLT 171 02/26/2016      Chemistry      Component Value Date/Time   NA 134 (L) 12/06/2015 0800   NA 139 05/31/2015 0805   K 3.9 12/06/2015 0800   K 3.8 05/31/2015 0805   CL 96 (L) 12/06/2015 0800   CL 105 05/20/2012 0823   CO2 29 12/06/2015 0800   CO2 27 05/31/2015 0805   BUN 23 12/06/2015 0800   BUN 15.0 05/31/2015 0805   CREATININE 1.21 (H) 12/06/2015 0800   CREATININE 1.0 05/31/2015 0805      Component Value Date/Time   CALCIUM 9.9 12/06/2015 0800   CALCIUM 9.9 05/31/2015 0805   ALKPHOS 55  05/31/2015 0805   AST 23 05/31/2015 0805   ALT 12 05/31/2015 0805   BILITOT 0.91 05/31/2015 0805      ASSESSMENT & PLAN:  CLL (chronic lymphocytic leukemia) She is doing well. Anemia has resolved and she has normal platelet count today.   She still has persistent leukocytosis, indicating residual disease Apart from minor diarrhea and bruising, she has no other side effects.. We will continue same treatment for now. With stability of her blood count, I will see her back in 3 months. I plan to stage her with CT scan once her CBC normalized  Essential hypertension Her blood pressure is very high today which she attributed that to whitecoat hypertension. According to her blood pressure monitoring at home, usually within acceptable limits. She will continue same medical management and follow closely  with primary care doctor and her cardiologist for medication adjustment.   No orders of the defined types were placed in this encounter.  All questions were answered. The patient knows to call the clinic with any problems, questions or concerns. No barriers to learning was detected. I spent 10 minutes counseling the patient face to face. The total time spent in the appointment was 15 minutes and more than 50% was on counseling and review of test results     Heath Lark, MD 02/26/2016 8:50 AM

## 2016-03-26 MED FILL — IMBRUVICA 140 MG CAPSULE: 140 | 30 days supply | Qty: 90 | Fill #3

## 2016-04-23 MED FILL — IMBRUVICA 140 MG CAPSULE: 140 | 30 days supply | Qty: 90 | Fill #4

## 2016-04-29 ENCOUNTER — Telehealth: Payer: Self-pay | Admitting: Cardiovascular Disease

## 2016-04-29 NOTE — Telephone Encounter (Signed)
I spoke with the Nicole Mercado and since Saturday she has had symptoms of dizziness and then breaks out in a sweat and has chills. She has a friend who has had similar symptoms and she felt like symptoms are related to a "bug". The Nicole Mercado went to [place] yesterday and they told her it was allergies but they did comment that BP was low and heart rate seemed fast.  BP readings this morning 97/59, 110/68 and 107/55 prior to BP medications.  The Nicole Mercado is unsure of pulse rate at this time.  The Nicole Mercado contacted the office to find out what to do with her medications.  Reviewed medication list and she is taking amlodipine, chlorthalidone, lisinopril and metoprolol succinate.  I will forward this message to Richardson Dopp PA-C/Dr Burt Knack to review and make further recommendations at this time. I also advised that she needs to remain well hydrated.

## 2016-04-29 NOTE — Telephone Encounter (Signed)
I spoke with the pt and her most recent BP is 123/68 and pulse 79. The pt states she feels good at this time.  I made her aware of instructions from [facility].  The pt will continue to monitor her BP and pulse and will resume Chlorthalidone for BP >140/90. She will contact the office with any additional questions or concerns.

## 2016-04-29 NOTE — Telephone Encounter (Signed)
New message      Pt c/o BP issue: STAT if pt c/o blurred vision, one-sided weakness or slurred speech  1. What are your last 5 BP readings? 97/59, 110/68, 107/55  2. Are you having any other symptoms (ex. Dizziness, headache, blurred vision, passed out)? dizziness  3. What is your BP issue? Pt states that her bp is low before taking medication.  Please advise

## 2016-04-29 NOTE — Telephone Encounter (Signed)
Left message on machine for pt to contact the office.   

## 2016-04-29 NOTE — Telephone Encounter (Signed)
[address] [place], [place]    04/29/2016 3:23 PM

## 2016-04-29 NOTE — Telephone Encounter (Signed)
Follow up   Pt returning call to [place]

## 2016-04-29 NOTE — Telephone Encounter (Signed)
Hold chlorthalidone until acute illness resolves. Keep track of BP and resume once BP >140/90. Need to know how fast her pulse was. . . If no record from urgent care, can we get her to come in for an ECG/BP check? Richardson Dopp, PA-C    04/29/2016 12:28 PM

## 2016-05-01 ENCOUNTER — Telehealth: Payer: Self-pay | Admitting: Cardiovascular Disease

## 2016-05-01 NOTE — Telephone Encounter (Signed)
[name] is calling about he blood pressure . Please call .Nicole Mercado Thanks

## 2016-05-01 NOTE — Telephone Encounter (Signed)
Follow up   Pt said she missed a call and it may have been Lauren.

## 2016-05-01 NOTE — Telephone Encounter (Signed)
I spoke with the pt and she wanted to make [place] aware of most recent BP readings: 99/67 pulse 82, 107/83 pulse 85, 113/61 pulse 85. The pt is holding chlorthalidone at this time as previously advised.  She states that she is feeling well and no longer having diaphoresis or dizziness. The pt feels like her BP is still to low.  At this time the pt will cut her Amlodipine to 5mg  daily, continue metoprolol and lisinopril. I will contact the pt tomorrow to see how she is feeling. Pt agreed with plan.

## 2016-05-02 NOTE — Telephone Encounter (Signed)
New Message     Pt is returning your call about her bp

## 2016-05-02 NOTE — Telephone Encounter (Signed)
Left message on machine for pt to contact the office.   

## 2016-05-02 NOTE — Telephone Encounter (Signed)
I spoke with the pt and she wanted to give an update on her BP today:  92/51 76 94/61 99 122/92 72  The pt feels well and denies any symptoms. Today is the first day that the pt has taken a lower dosage of Amlodipine 5mg  daily.  The pt will continue to monitor her BP and will contact the office over the weekend with any additional questions or concerns. Otherwise the pt will call the office on Monday with an update.

## 2016-05-05 NOTE — Telephone Encounter (Signed)
I spoke with the pt and she her readings over the weekend were:  107/65, 92 100/65, 104 The pt has not checked BP today.   The pt states that she feels fine. She is trying to drink more water. I will forward this information to Dr Burt Knack for review.

## 2016-05-05 NOTE — Telephone Encounter (Signed)
Would continue same Rx as long as she is feeling well

## 2016-05-05 NOTE — Telephone Encounter (Signed)
Pt is aware to continue holding chlorthalidone at this time and continue reduced dose of Amlodipine 5mg  daily. She will continue to monitor BP and pulse and call the office with any issues.

## 2016-05-06 ENCOUNTER — Telehealth: Payer: Self-pay | Admitting: Cardiovascular Disease

## 2016-05-06 NOTE — Telephone Encounter (Signed)
New Message     Pt states her heart rate was higher than normal (93-122-80)  And her bp is higher than normal 112/81 111/73 135/69 does she need to modify her med.  amLODipine (NORVASC) 10 MG tablet Take 1 tablet (10 mg total) by mouth daily.

## 2016-05-06 NOTE — Telephone Encounter (Signed)
Pt called today because she said that she took her BP twice one after another. Her first BP this  was 112/81 HR 93 beats/minute and soon after the  second BP 111/73, HR 122 beats/minute. Pt was made aware that if she takes BP's one after another the second BP  may not be accurate. Pt was instructed to wait a few minutes if she feels that she wants to verify that the BP she has taken is accurate.  Pt verbalized understanding. Pt feels fine no symptoms. Pt is continues taken Amlodipine 5 mg once daily.

## 2016-05-20 ENCOUNTER — Other Ambulatory Visit: Payer: Self-pay | Admitting: Hematology and Oncology

## 2016-05-20 MED FILL — IMBRUVICA 140 MG CAPSULE: 140 | 30 days supply | Qty: 90 | Fill #0

## 2016-05-27 ENCOUNTER — Encounter: Payer: Self-pay | Admitting: Hematology and Oncology

## 2016-05-27 ENCOUNTER — Other Ambulatory Visit (HOSPITAL_BASED_OUTPATIENT_CLINIC_OR_DEPARTMENT_OTHER): Payer: Medicare Other

## 2016-05-27 ENCOUNTER — Telehealth: Payer: Self-pay | Admitting: Hematology and Oncology

## 2016-05-27 ENCOUNTER — Ambulatory Visit (HOSPITAL_BASED_OUTPATIENT_CLINIC_OR_DEPARTMENT_OTHER): Payer: Medicare Other | Admitting: Hematology and Oncology

## 2016-05-27 VITALS — BP 199/49 | HR 55 | Temp 97.9°F | Ht 60.0 in | Wt 103.9 lb

## 2016-05-27 DIAGNOSIS — C9110 Chronic lymphocytic leukemia of B-cell type not having achieved remission: Secondary | ICD-10-CM

## 2016-05-27 DIAGNOSIS — I10 Essential (primary) hypertension: Secondary | ICD-10-CM | POA: Diagnosis not present

## 2016-05-27 DIAGNOSIS — D696 Thrombocytopenia, unspecified: Secondary | ICD-10-CM

## 2016-05-27 DIAGNOSIS — C9190 Lymphoid leukemia, unspecified not having achieved remission: Secondary | ICD-10-CM | POA: Diagnosis not present

## 2016-05-27 DIAGNOSIS — D702 Other drug-induced agranulocytosis: Secondary | ICD-10-CM

## 2016-05-27 LAB — CBC WITH DIFFERENTIAL/PLATELET
BASO%: 0.8 % (ref 0.0–2.0)
Basophils Absolute: 0.1 10*3/uL (ref 0.0–0.1)
EOS ABS: 0 10*3/uL (ref 0.0–0.5)
EOS%: 0.6 % (ref 0.0–7.0)
HCT: 39.2 % (ref 34.8–46.6)
HGB: 12.5 g/dL (ref 11.6–15.9)
LYMPH%: 66.5 % — AB (ref 14.0–49.7)
MCH: 29.6 pg (ref 25.1–34.0)
MCHC: 31.9 g/dL (ref 31.5–36.0)
MCV: 92.9 fL (ref 79.5–101.0)
MONO#: 1.3 10*3/uL — ABNORMAL HIGH (ref 0.1–0.9)
MONO%: 20.1 % — AB (ref 0.0–14.0)
NEUT%: 12 % — ABNORMAL LOW (ref 38.4–76.8)
NEUTROS ABS: 0.8 10*3/uL — AB (ref 1.5–6.5)
Platelets: 143 10*3/uL — ABNORMAL LOW (ref 145–400)
RBC: 4.22 10*6/uL (ref 3.70–5.45)
RDW: 13.1 % (ref 11.2–14.5)
WBC: 6.5 10*3/uL (ref 3.9–10.3)
lymph#: 4.3 10*3/uL — ABNORMAL HIGH (ref 0.9–3.3)

## 2016-05-27 NOTE — Assessment & Plan Note (Signed)
She has poorly controlled hypertension I will alert her cardiologist and recommend she takes blood pressure today pending further discussion with the cardiologist for medication adjustment

## 2016-05-27 NOTE — Progress Notes (Signed)
[place] OFFICE PROGRESS NOTE  Patient Care Team: Kristie Cowman, MD as PCP - General (Family Medicine) Kristie Cowman, MD (Family Medicine) Terance Ice, MD (Cardiology) Sherren Mocha, MD as Consulting Physician (Cardiology)  SUMMARY OF ONCOLOGIC HISTORY:   CLL (chronic lymphocytic leukemia) ([place])   03/21/2011 Initial Diagnosis    CLL (chronic lymphocytic leukemia)      01/14/2014 - 01/31/2014 Hospital Admission    The patient was hospitalized and was found to severe aortic stenosis causing syncopal episode. She received treatment with steroids with minimum success and required platelet transfusion.      09/18/2014 Imaging    CT scan of the chest, abdomen and pelvis show lymphadenopathy but low level burden of disease.      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 There was no stenosis. Mild peri-valvular aortic insufficiency is noted      11/04/2014 -  Chemotherapy    She is started on Ibrutinib       INTERVAL HISTORY: Please see below for problem oriented charting. She returns for further follow-up She is compliant taking her medications as instructed She had a flulike illness a month ago, recover spontaneously without antibiotics She is noted to have poorly controlled hypertension According to her caregiver/friend, her blood pressure measured 226 systolic consistently at home She denies chest pain, dizziness or shortness of breath The patient denies any recent signs or symptoms of bleeding such as spontaneous epistaxis, hematuria or hematochezia. She bruises easily  REVIEW OF SYSTEMS:   Constitutional: Denies fevers, chills or abnormal weight loss Eyes: Denies blurriness of vision Ears, nose, mouth, throat, and face: Denies mucositis or sore throat Respiratory: Denies cough, dyspnea or wheezes Cardiovascular: Denies  palpitation, chest discomfort or lower extremity swelling Gastrointestinal:  Denies nausea, heartburn or change in bowel habits Skin: Denies abnormal skin rashes Lymphatics: Denies new lymphadenopathy  Neurological:Denies numbness, tingling or new weaknesses Behavioral/Psych: Mood is stable, no new changes  All other systems were reviewed with the patient and are negative.  I have reviewed the past medical history, past surgical history, social history and family history with the patient and they are unchanged from previous note.  ALLERGIES:  has No Known Allergies.  MEDICATIONS:  Current Outpatient Prescriptions  Medication Sig Dispense Refill  . acetaminophen (TYLENOL) 325 MG tablet Take 650 mg by mouth every 6 (six) hours as needed for mild pain or headache.     Marland Kitchen amLODipine (NORVASC) 10 MG tablet Take 1 tablet (10 mg total) by mouth daily. 90 tablet 3  . amoxicillin (AMOXIL) 500 MG tablet Take 4 tablets by mouth one hour prior to dental procedure 8 tablet 2  . aspirin 81 MG tablet Take 81 mg by mouth daily.    Marland Kitchen atorvastatin (LIPITOR) 10 MG tablet TAKE ONE TABLET BY MOUTH ONCE DAILY AT  6PM 90 tablet 3  . ibrutinib (IMBRUVICA) 140 MG capsul Take 3 capsules (420 mg total) by mouth daily. 90 capsule 6  . IMBRUVICA 140 MG capsul TAKE 3 CAPSULES BY MOUTH DAILY 90 capsule 6  . lisinopril (PRINIVIL,ZESTRIL) 20 MG tablet TAKE ONE TABLET BY MOUTH ONCE DAILY 90 tablet 3  . metoprolol succinate (TOPROL-XL) 50 MG 24 hr tablet TAKE ONE TABLET BY MOUTH ONCE DAILY WITH  OR  IMMEDIATELY  FOLLOWING  A  MEAL 90 tablet 3  . chlorthalidone (HYGROTON) 25  MG tablet Take 0.5 tablets (12.5 mg total) by mouth daily. 90 tablet 3   No current facility-administered medications for this visit.     PHYSICAL EXAMINATION: ECOG PERFORMANCE STATUS: 1 - Symptomatic but completely ambulatory  Vitals:   05/27/16 0830 05/27/16 0832  BP: (!) 209/52 (!) 199/49  Pulse: (!) 55   Temp: 97.9 F (36.6 C)    Filed  Weights   05/27/16 0830  Weight: 103 lb 14.4 oz (47.1 kg)    GENERAL:alert, no distress and comfortable SKIN: skin color, texture, turgor are normal, no rashes or significant lesions.  Noted skin bruising EYES: normal, Conjunctiva are pink and non-injected, sclera clear OROPHARYNX:no exudate, no erythema and lips, buccal mucosa, and tongue normal  NECK: supple, thyroid normal size, non-tender, without nodularity LYMPH:  no palpable lymphadenopathy in the cervical, axillary or inguinal LUNGS: clear to auscultation and percussion with normal breathing effort HEART: regular rate & rhythm and no murmurs and no lower extremity edema ABDOMEN:abdomen soft, non-tender and normal bowel sounds Musculoskeletal:no cyanosis of digits and no clubbing  NEURO: alert & oriented x 3 with fluent speech, no focal motor/sensory deficits  LABORATORY DATA:  I have reviewed the data as listed    Component Value Date/Time   NA 134 (L) 12/06/2015 0800   NA 139 05/31/2015 0805   K 3.9 12/06/2015 0800   K 3.8 05/31/2015 0805   CL 96 (L) 12/06/2015 0800   CL 105 05/20/2012 0823   CO2 29 12/06/2015 0800   CO2 27 05/31/2015 0805   GLUCOSE 90 12/06/2015 0800   GLUCOSE 102 05/31/2015 0805   GLUCOSE 70 05/20/2012 0823   BUN 23 12/06/2015 0800   BUN 15.0 05/31/2015 0805   CREATININE 1.21 (H) 12/06/2015 0800   CREATININE 1.0 05/31/2015 0805   CALCIUM 9.9 12/06/2015 0800   CALCIUM 9.9 05/31/2015 0805   PROT 6.2 (L) 05/31/2015 0805   ALBUMIN 3.7 05/31/2015 0805   AST 23 05/31/2015 0805   ALT 12 05/31/2015 0805   ALKPHOS 55 05/31/2015 0805   BILITOT 0.91 05/31/2015 0805   GFRNONAA 51 (L) 01/21/2014 0420   GFRAA 60 (L) 01/21/2014 0420    No results found for: SPEP, UPEP  Lab Results  Component Value Date   WBC 6.5 05/27/2016   NEUTROABS 0.8 (L) 05/27/2016   HGB 12.5 05/27/2016   HCT 39.2 05/27/2016   MCV 92.9 05/27/2016   PLT 143 (L) 05/27/2016      Chemistry      Component Value Date/Time    NA 134 (L) 12/06/2015 0800   NA 139 05/31/2015 0805   K 3.9 12/06/2015 0800   K 3.8 05/31/2015 0805   CL 96 (L) 12/06/2015 0800   CL 105 05/20/2012 0823   CO2 29 12/06/2015 0800   CO2 27 05/31/2015 0805   BUN 23 12/06/2015 0800   BUN 15.0 05/31/2015 0805   CREATININE 1.21 (H) 12/06/2015 0800   CREATININE 1.0 05/31/2015 0805      Component Value Date/Time   CALCIUM 9.9 12/06/2015 0800   CALCIUM 9.9 05/31/2015 0805   ALKPHOS 55 05/31/2015 0805   AST 23 05/31/2015 0805   ALT 12 05/31/2015 0805   BILITOT 0.91 05/31/2015 0805       ASSESSMENT & PLAN:  CLL (chronic lymphocytic leukemia) Her total white count is finally normal I recommend repeat imaging study in 2 weeks for assessment of response to treatment She agrees She will continue ibrutinib for now  Thrombocytopenia Her white count is  improving on Ibrutinib With stability of her platelet count, I recommend she continues 81 mg aspirin for stroke prevention.   Essential hypertension She has poorly controlled hypertension I will alert her cardiologist and recommend she takes blood pressure today pending further discussion with the cardiologist for medication adjustment  Drug-induced neutropenia ([place]) This is likely due to recent treatment. The patient denies recent history of fevers, cough, chills, diarrhea or dysuria. She is asymptomatic from the leukopenia. I will observe for now.  I will continue the chemotherapy at current dose without dosage adjustment.  If the leukopenia gets progressive worse in the future, I might have to delay her treatment or adjust the chemotherapy dose.     Orders Placed This Encounter  Procedures  . CT CHEST W CONTRAST    Standing Status:   Future    Standing Expiration Date:   08/27/2017    Order Specific Question:   Reason for Exam (SYMPTOM  OR DIAGNOSIS REQUIRED)    Answer:   staging CL, assess response to Rx    Order Specific Question:   Preferred imaging location?    Answer:    [facility]  . CT ABDOMEN PELVIS W CONTRAST    Standing Status:   Future    Standing Expiration Date:   08/27/2017    Order Specific Question:   Reason for Exam (SYMPTOM  OR DIAGNOSIS REQUIRED)    Answer:   staging CL, assess response to Rx    Order Specific Question:   Preferred imaging location?    Answer:   [facility]  . Comprehensive metabolic panel    Standing Status:   Future    Standing Expiration Date:   08/27/2017  . Lactate dehydrogenase    Standing Status:   Future    Standing Expiration Date:   08/27/2017   All questions were answered. The patient knows to call the clinic with any problems, questions or concerns. No barriers to learning was detected. I spent 20 minutes counseling the patient face to face. The total time spent in the appointment was 25 minutes and more than 50% was on counseling and review of test results     Heath Lark, MD 05/27/2016 12:06 PM

## 2016-05-27 NOTE — Telephone Encounter (Signed)
Gave patient avs report and appointments for June. Central radiology will call re scan.  °

## 2016-05-27 NOTE — Assessment & Plan Note (Signed)
Her white count is improving on Ibrutinib With stability of her platelet count, I recommend she continues 81 mg aspirin for stroke prevention.

## 2016-05-27 NOTE — Assessment & Plan Note (Signed)
Her total white count is finally normal I recommend repeat imaging study in 2 weeks for assessment of response to treatment She agrees She will continue ibrutinib for now

## 2016-05-27 NOTE — Assessment & Plan Note (Signed)
This is likely due to recent treatment. The patient denies recent history of fevers, cough, chills, diarrhea or dysuria. She is asymptomatic from the leukopenia. I will observe for now.  I will continue the chemotherapy at current dose without dosage adjustment.  If the leukopenia gets progressive worse in the future, I might have to delay her treatment or adjust the chemotherapy dose.   

## 2016-06-04 ENCOUNTER — Telehealth: Payer: Self-pay | Admitting: *Deleted

## 2016-06-04 NOTE — Telephone Encounter (Signed)
Notified of message below

## 2016-06-04 NOTE — Telephone Encounter (Signed)
I will cancel her appt on 6/6 and reschedule to 6/13 I will place scheduling msg

## 2016-06-04 NOTE — Telephone Encounter (Signed)
Received vm call from pt asking for return call.  Call returned & she states that she has an appt for 6/12 for CT & her appt with Dr Alvy Bimler is 06/11/16.  She wants to know if she should wait & see Dr Alvy Bimler after CT.  Message routed to Dr Marlinda Mike RN

## 2016-06-10 ENCOUNTER — Telehealth: Payer: Self-pay | Admitting: Hematology and Oncology

## 2016-06-10 NOTE — Telephone Encounter (Signed)
Added f/u 6/13 and left message for patient. Other appointments remain the same. Schedule mailed.

## 2016-06-11 ENCOUNTER — Ambulatory Visit: Payer: Medicare Other | Admitting: Hematology and Oncology

## 2016-06-12 MED FILL — IMBRUVICA 140 MG CAPSULE: 140 | 30 days supply | Qty: 90 | Fill #1

## 2016-06-16 ENCOUNTER — Telehealth: Payer: Self-pay | Admitting: *Deleted

## 2016-06-16 NOTE — Telephone Encounter (Signed)
"  Just received a call but could not understand the appointment information for tomorrow.  Can I eat?  Can I take my regular medications?"  Advised to come to [facility] to register for lab appointment at 0830.  NPO after 6:30 am.  Drink contrast here at 8:30 and 9:30 am.  Arrive to radiology to register for CT scan at 10:15.  Take medications after scans completed.  No further questions.

## 2016-06-17 ENCOUNTER — Other Ambulatory Visit (HOSPITAL_BASED_OUTPATIENT_CLINIC_OR_DEPARTMENT_OTHER): Payer: Medicare Other

## 2016-06-17 ENCOUNTER — Encounter (HOSPITAL_COMMUNITY): Payer: Self-pay | Admitting: Radiology

## 2016-06-17 ENCOUNTER — Ambulatory Visit (HOSPITAL_COMMUNITY)
Admission: RE | Admit: 2016-06-17 | Discharge: 2016-06-17 | Disposition: A | Discharge status: Home / Self Care | Payer: Medicare Other | Source: Ambulatory Visit | Attending: Hematology and Oncology | Admitting: Hematology and Oncology

## 2016-06-17 DIAGNOSIS — Z951 Presence of aortocoronary bypass graft: Secondary | ICD-10-CM | POA: Insufficient documentation

## 2016-06-17 DIAGNOSIS — N261 Atrophy of kidney (terminal): Secondary | ICD-10-CM | POA: Insufficient documentation

## 2016-06-17 DIAGNOSIS — I700 Atherosclerosis of aorta: Secondary | ICD-10-CM | POA: Diagnosis not present

## 2016-06-17 DIAGNOSIS — C9110 Chronic lymphocytic leukemia of B-cell type not having achieved remission: Secondary | ICD-10-CM

## 2016-06-17 DIAGNOSIS — I2510 Atherosclerotic heart disease of native coronary artery without angina pectoris: Secondary | ICD-10-CM | POA: Diagnosis not present

## 2016-06-17 DIAGNOSIS — K5730 Diverticulosis of large intestine without perforation or abscess without bleeding: Secondary | ICD-10-CM | POA: Insufficient documentation

## 2016-06-17 DIAGNOSIS — N200 Calculus of kidney: Secondary | ICD-10-CM | POA: Diagnosis not present

## 2016-06-17 DIAGNOSIS — C9190 Lymphoid leukemia, unspecified not having achieved remission: Secondary | ICD-10-CM | POA: Diagnosis present

## 2016-06-17 LAB — CBC WITH DIFFERENTIAL/PLATELET
BASO%: 0.4 % (ref 0.0–2.0)
Basophils Absolute: 0 10*3/uL (ref 0.0–0.1)
EOS ABS: 0 10*3/uL (ref 0.0–0.5)
EOS%: 0.2 % (ref 0.0–7.0)
HCT: 41.7 % (ref 34.8–46.6)
HGB: 13.8 g/dL (ref 11.6–15.9)
LYMPH%: 57.8 % — AB (ref 14.0–49.7)
MCH: 29.7 pg (ref 25.1–34.0)
MCHC: 33.1 g/dL (ref 31.5–36.0)
MCV: 89.9 fL (ref 79.5–101.0)
MONO#: 1.6 10*3/uL — AB (ref 0.1–0.9)
MONO%: 16.1 % — ABNORMAL HIGH (ref 0.0–14.0)
NEUT#: 2.6 10*3/uL (ref 1.5–6.5)
NEUT%: 25.5 % — AB (ref 38.4–76.8)
PLATELETS: 174 10*3/uL (ref 145–400)
RBC: 4.64 10*6/uL (ref 3.70–5.45)
RDW: 13 % (ref 11.2–14.5)
WBC: 10 10*3/uL (ref 3.9–10.3)
lymph#: 5.8 10*3/uL — ABNORMAL HIGH (ref 0.9–3.3)
nRBC: 0 % (ref 0–0)

## 2016-06-17 LAB — COMPREHENSIVE METABOLIC PANEL
ALT: 13 U/L (ref 0–55)
ANION GAP: 9 meq/L (ref 3–11)
AST: 26 U/L (ref 5–34)
Albumin: 3.9 g/dL (ref 3.5–5.0)
Alkaline Phosphatase: 56 U/L (ref 40–150)
BUN: 13 mg/dL (ref 7.0–26.0)
CALCIUM: 10.8 mg/dL — AB (ref 8.4–10.4)
CHLORIDE: 103 meq/L (ref 98–109)
CO2: 29 mEq/L (ref 22–29)
CREATININE: 1.1 mg/dL (ref 0.6–1.1)
EGFR: 47 mL/min/{1.73_m2} — AB (ref >90)
Glucose: 81 mg/dl (ref 70–140)
POTASSIUM: 4.7 meq/L (ref 3.5–5.1)
SODIUM: 141 meq/L (ref 136–145)
Total Bilirubin: 0.83 mg/dL (ref 0.20–1.20)
Total Protein: 6.5 g/dL (ref 6.4–8.3)

## 2016-06-17 LAB — LACTATE DEHYDROGENASE: LDH: 251 U/L — AB (ref 125–245)

## 2016-06-17 IMAGING — CT CT CHEST W/ CM
2 of 3 series · 12 of 35 positions shown, 15 images · IV contrast (APPLIED)
Comparison: CT of the chest, abdomen and pelvis [DATE].

CLINICAL DATA: 75-year-old female with history of chronic
lymphocytic leukemia and thrombocytopenia. Evaluate response to
therapy. Followup study.

EXAM:
CT CHEST, ABDOMEN, AND PELVIS WITH CONTRAST
TECHNIQUE: Multidetector CT imaging of the chest, abdomen and pelvis was
performed following the standard protocol during bolus
administration of intravenous contrast.
CONTRAST:  100mL [L8] IOPAMIDOL ([L8]) INJECTION 61%

[Series 2: cap with · axial · 0.64mm/px · z∈[-591,-81]mm · 9 of 124 slices shown, 12 images]
[im 11/124  mediastinal]
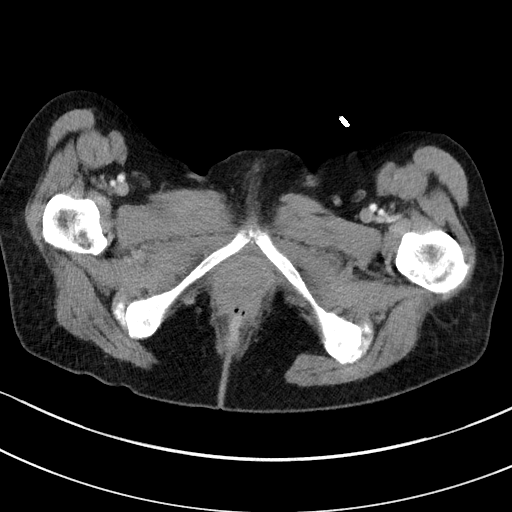
[im 11/124  lung]
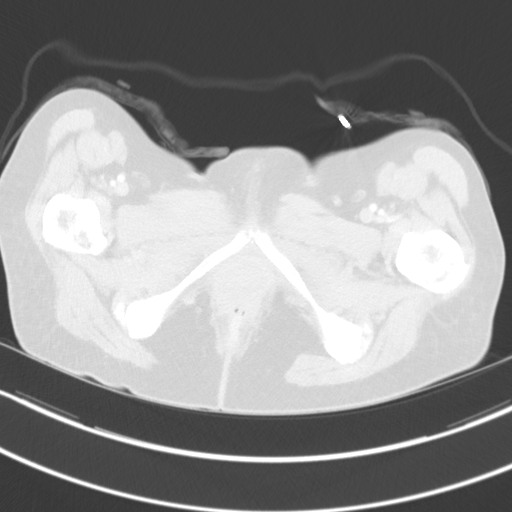
[im 22/124  lung]
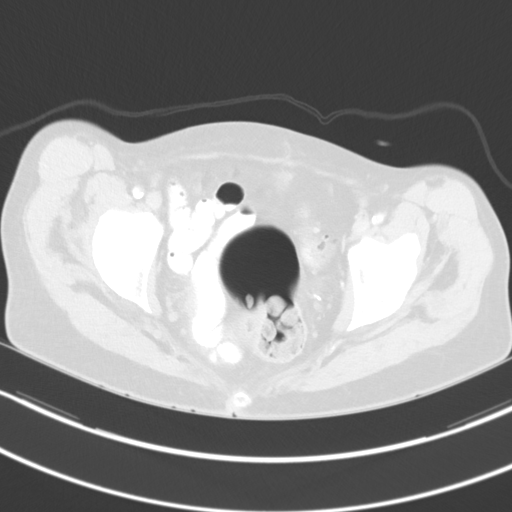
[im 38/124  lung]
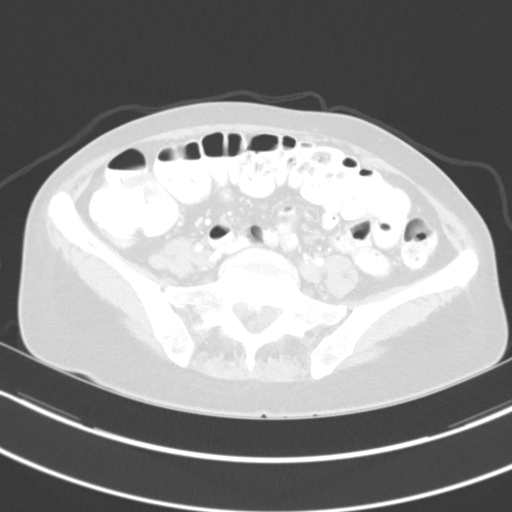
[im 49/124  lung]
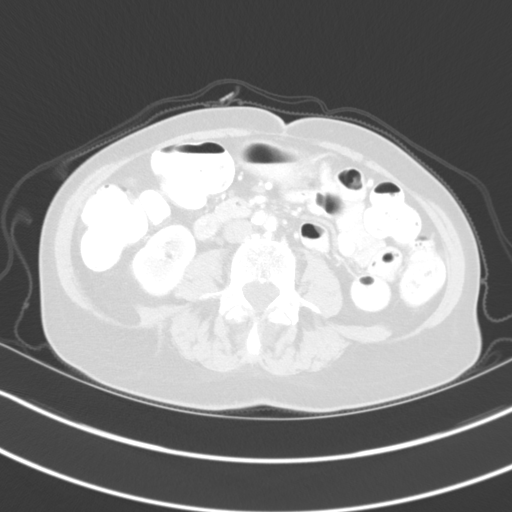
[im 61/124  mediastinal]
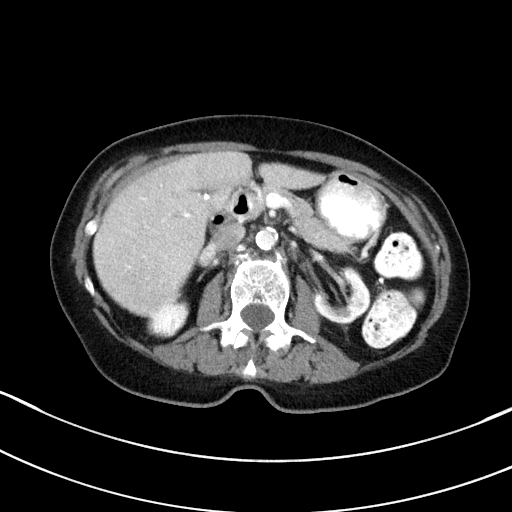
[im 61/124  lung]
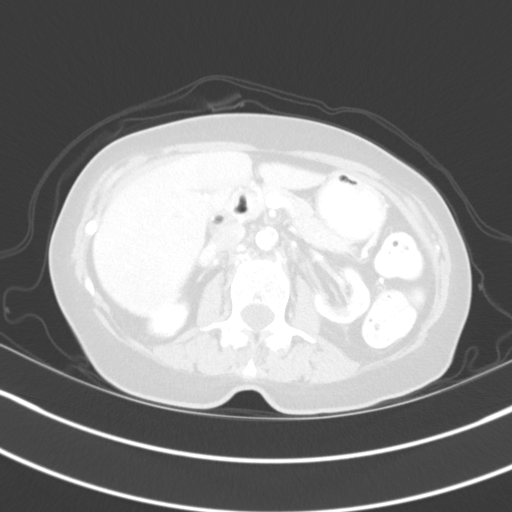
[im 75/124  lung]
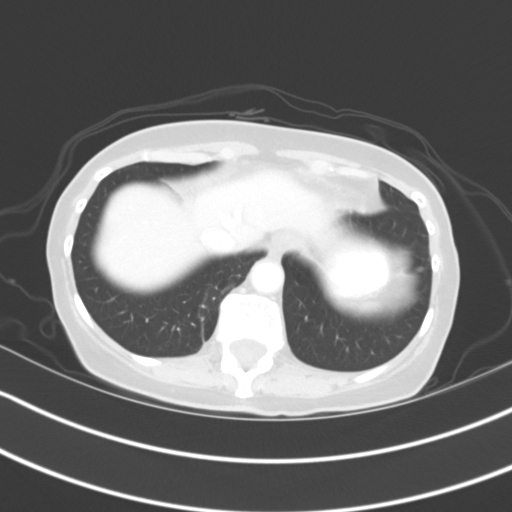
[im 86/124  lung]
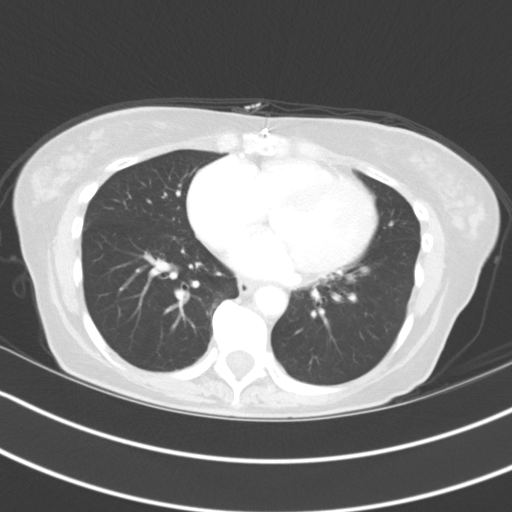
[im 102/124  lung]
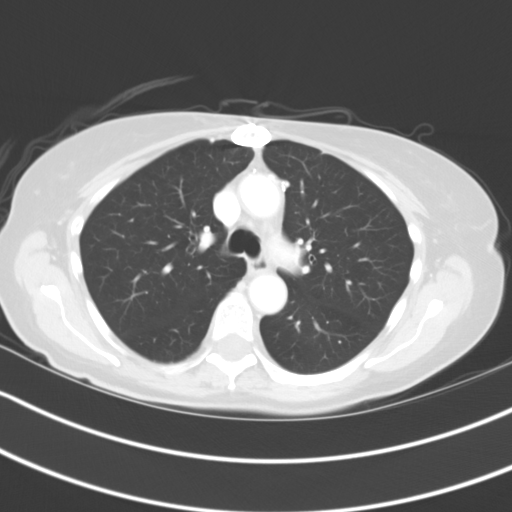
[im 113/124  mediastinal]
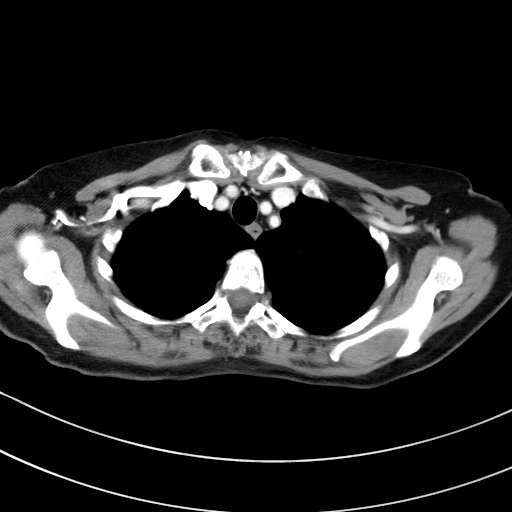
[im 113/124  lung]
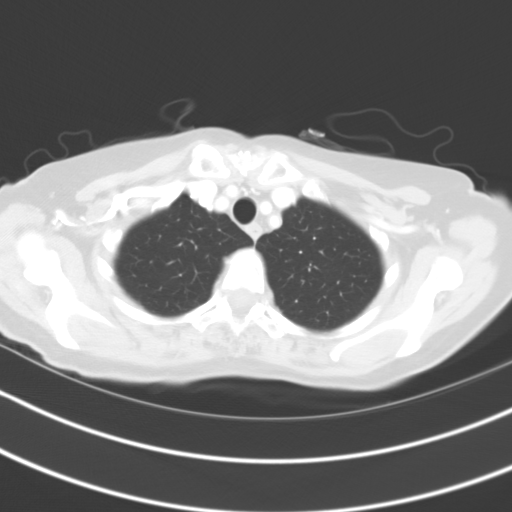

[Series 5: coronals · coronal · 0.65mm/px · 3 of 103 slices shown]
[im 21/103  lung]
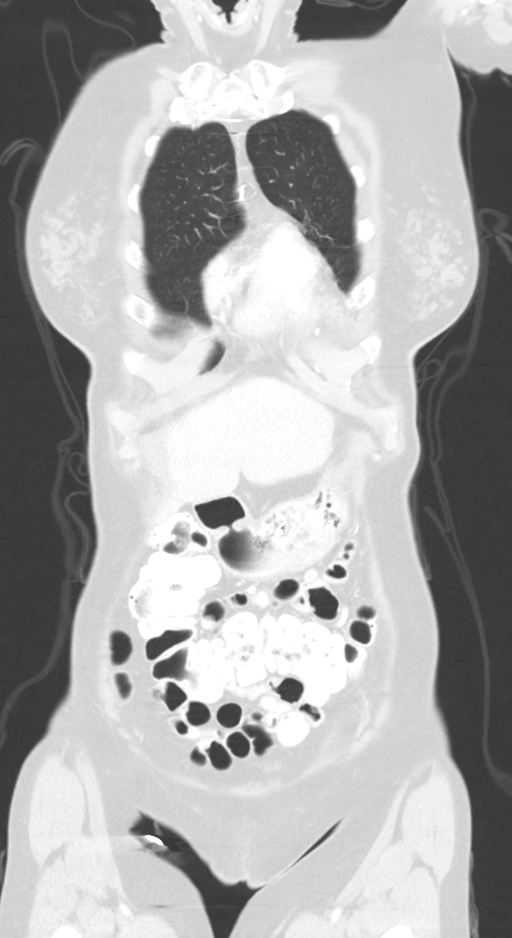
[im 41/103  lung]
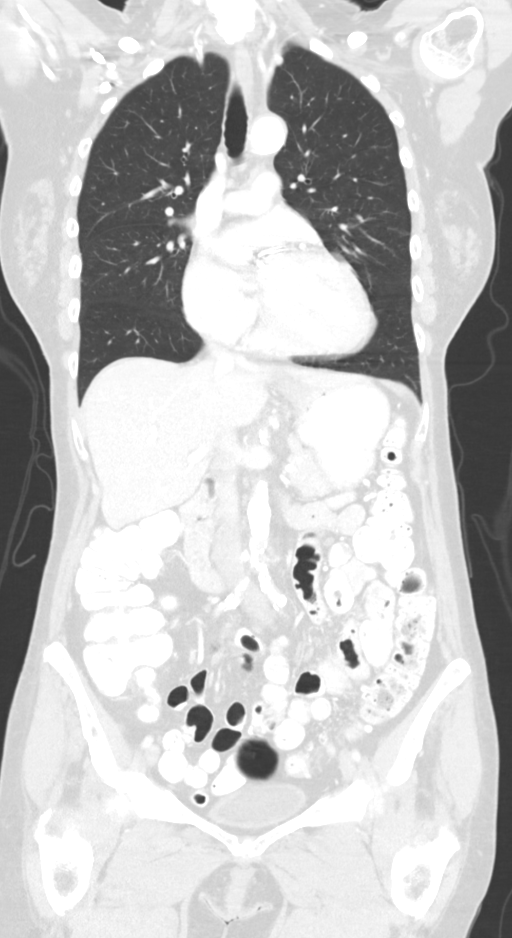
[im 62/103  lung]
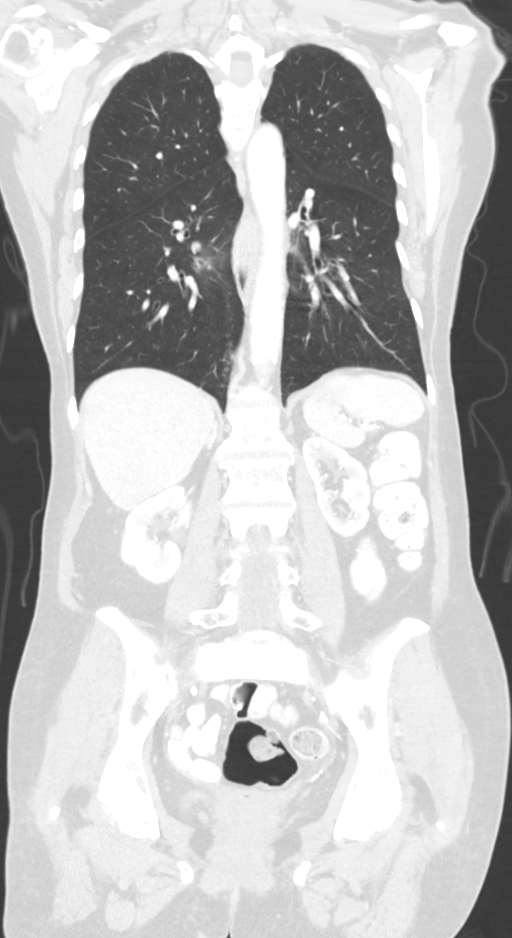

[12 of 35 positions shown; findings below may reference images not displayed]

FINDINGS: CT CHEST FINDINGS

Cardiovascular: Heart size is mildly enlarged. There is no
significant pericardial fluid, thickening or pericardial
calcification. There is aortic atherosclerosis, as well as
atherosclerosis of the great vessels of the mediastinum and the
coronary arteries, including calcified atherosclerotic plaque in the
left main, left anterior descending, left circumflex and right
coronary arteries. Status post median sternotomy for CABG including
[REDACTED] to the LAD. Patient is also status post aortic valve
replacement with a stented bioprosthesis.

Mediastinum/Nodes: No pathologically enlarged mediastinal or hilar
lymph nodes. No supraclavicular lymphadenopathy. Esophagus is
unremarkable in appearance. No axillary lymphadenopathy.

Lungs/Pleura: Scattered areas of linear scarring, most evident in
the lower lobes of the lungs bilaterally. No acute consolidative
airspace disease. No pleural effusions. No suspicious appearing
pulmonary nodules or masses.

Musculoskeletal: Median sternotomy wires. There are no aggressive
appearing lytic or blastic lesions noted in the visualized portions
of the skeleton.

ABDOMEN PELVIS FINDINGS

Hepatobiliary: Pneumobilia, suggesting prior sphincterotomy. Status
post cholecystectomy. No suspicious cystic or solid hepatic lesions.

Pancreas: No pancreatic mass. No pancreatic ductal dilatation. No
pancreatic or peripancreatic fluid or inflammatory changes.

Spleen: Unremarkable.

Adrenals/Urinary Tract: Subcentimeter low-attenuation lesions in the
right kidney are too small to definitively characterize, but are
statistically likely to represent tiny cysts. Atrophy of the left
kidney. 3 mm nonobstructive calculus in the lower pole collecting
system of left kidney. No hydroureteronephrosis. Urinary bladder is
normal in appearance. Bilateral adrenal glands are normal in
appearance.

Stomach/Bowel: The appearance of the stomach is normal. There is no
pathologic dilatation of small bowel or colon. A few scattered
colonic diverticulae are noted, without surrounding inflammatory
changes to suggest an acute diverticulitis at this time. The
appendix is not confidently identified and may be surgically absent.
Regardless, there are no inflammatory changes noted adjacent to the
cecum to suggest the presence of an acute appendicitis at this time.

Vascular/Lymphatic: Aortic atherosclerosis, without evidence of
aneurysm or dissection in the abdominal or pelvic vasculature. No
lymphadenopathy noted in the abdomen or pelvis.

Reproductive: Status posthysterectomy. Ovaries are not confidently
identified may be surgically absent or atrophic.

Other: No significant volume of ascites.  No pneumoperitoneum.

Musculoskeletal: There are no aggressive appearing lytic or blastic
lesions noted in the visualized portions of the skeleton.
IMPRESSION: 1. No lymphadenopathy noted in the chest, abdomen or pelvis to
suggest recurrent disease.
2. Aortic atherosclerosis, in addition to left main and 3 vessel
coronary artery disease. Status post median sternotomy for CABG
including [REDACTED] to the LAD.
3. 3 mm nonobstructive calculus in the lower pole collecting system
of the left kidney. Mild atrophy of the left kidney.
4. Mild colonic diverticulosis, without evidence of acute
diverticulitis at this time.
5. Additional incidental findings, as above.

## 2016-06-17 MED ORDER — IOPAMIDOL (ISOVUE-300) INJECTION 61%
INTRAVENOUS | Status: AC
  Filled 2016-06-17: qty 100

## 2016-06-17 MED ORDER — IOPAMIDOL (ISOVUE-300) INJECTION 61%
100.0000 mL | Freq: Once | INTRAVENOUS | Status: AC | PRN
  Administered 2016-06-17: 100 mL via INTRAVENOUS

## 2016-06-18 ENCOUNTER — Telehealth: Payer: Self-pay | Admitting: Hematology and Oncology

## 2016-06-18 ENCOUNTER — Ambulatory Visit (HOSPITAL_BASED_OUTPATIENT_CLINIC_OR_DEPARTMENT_OTHER): Payer: Medicare Other | Admitting: Hematology and Oncology

## 2016-06-18 DIAGNOSIS — C9110 Chronic lymphocytic leukemia of B-cell type not having achieved remission: Secondary | ICD-10-CM | POA: Diagnosis not present

## 2016-06-18 DIAGNOSIS — I10 Essential (primary) hypertension: Secondary | ICD-10-CM

## 2016-06-18 NOTE — Telephone Encounter (Signed)
Scheduled appt per 6/13 los - Gave patient AVS and calender.  

## 2016-06-19 ENCOUNTER — Encounter: Payer: Self-pay | Admitting: Hematology and Oncology

## 2016-06-19 NOTE — Progress Notes (Signed)
Please ask patient to record and call in with home BP's over 1-2 weeks. Office readings are generally high but home readings have been better controlled in the past. I have meds as: chlorthalidone, lisinopril, Toprol XL, and amlodipine.

## 2016-06-19 NOTE — Assessment & Plan Note (Signed)
She has mild hypercalcemia This is likely due to her fasting state We will observe only and recheck blood work in the next visit

## 2016-06-19 NOTE — Assessment & Plan Note (Signed)
She has poorly controlled hypertension She will continue medical management as directed by her cardiologist I suspect part of the hypertension could be related to anxiety

## 2016-06-19 NOTE — Progress Notes (Signed)
[place] OFFICE PROGRESS NOTE  Patient Care Team: Kristie Cowman, MD as PCP - General (Family Medicine) Kristie Cowman, MD (Family Medicine) Terance Ice, MD (Cardiology) Sherren Mocha, MD as Consulting Physician (Cardiology)  SUMMARY OF ONCOLOGIC HISTORY:   CLL (chronic lymphocytic leukemia) ([place])   03/21/2011 Initial Diagnosis    CLL (chronic lymphocytic leukemia)      01/14/2014 - 01/31/2014 Hospital Admission    The patient was hospitalized and was found to severe aortic stenosis causing syncopal episode. She received treatment with steroids with minimum success and required platelet transfusion.      09/18/2014 Imaging    CT scan of the chest, abdomen and pelvis show lymphadenopathy but low level burden of disease.      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 There was no stenosis. Mild peri-valvular aortic insufficiency is noted      11/04/2014 -  Chemotherapy    She is started on Ibrutinib      06/17/2016 Imaging    1. No lymphadenopathy noted in the chest, abdomen or pelvis to suggest recurrent disease. 2. Aortic atherosclerosis, in addition to left main and 3 vessel coronary artery disease. Status post median sternotomy for CABG including LIMA to the LAD. 3. 3 mm nonobstructive calculus in the lower pole collecting system of the left kidney. Mild atrophy of the left kidney. 4. Mild colonic diverticulosis, without evidence of acute diverticulitis at this time. 5. Additional incidental findings, as above.       INTERVAL HISTORY: Please see below for problem oriented charting. She returns with her significant other to review test results In the meantime, she is completely asymptomatic Her only side effects from ibrutinib is mild bruising She denies diarrhea, palpitation or dizziness She denies recent infection  REVIEW OF  SYSTEMS:   Constitutional: Denies fevers, chills or abnormal weight loss Eyes: Denies blurriness of vision Ears, nose, mouth, throat, and face: Denies mucositis or sore throat Respiratory: Denies cough, dyspnea or wheezes Cardiovascular: Denies palpitation, chest discomfort or lower extremity swelling Gastrointestinal:  Denies nausea, heartburn or change in bowel habits Skin: Denies abnormal skin rashes Lymphatics: Denies new lymphadenopathy  Neurological:Denies numbness, tingling or new weaknesses Behavioral/Psych: Mood is stable, no new changes  All other systems were reviewed with the patient and are negative.  I have reviewed the past medical history, past surgical history, social history and family history with the patient and they are unchanged from previous note.  ALLERGIES:  has No Known Allergies.  MEDICATIONS:  Current Outpatient Prescriptions  Medication Sig Dispense Refill  . acetaminophen (TYLENOL) 325 MG tablet Take 650 mg by mouth every 6 (six) hours as needed for mild pain or headache.     Marland Kitchen amLODipine (NORVASC) 10 MG tablet Take 1 tablet (10 mg total) by mouth daily. 90 tablet 3  . amoxicillin (AMOXIL) 500 MG tablet Take 4 tablets by mouth one hour prior to dental procedure 8 tablet 2  . aspirin 81 MG tablet Take 81 mg by mouth daily.    Marland Kitchen atorvastatin (LIPITOR) 10 MG tablet TAKE ONE TABLET BY MOUTH ONCE DAILY AT  6PM 90 tablet 3  . ibrutinib (IMBRUVICA) 140 MG capsul Take 3 capsules (420 mg total) by mouth daily. 90 capsule 6  . IMBRUVICA 140 MG capsul TAKE 3 CAPSULES BY MOUTH DAILY 90 capsule 6  . lisinopril (PRINIVIL,ZESTRIL)  20 MG tablet TAKE ONE TABLET BY MOUTH ONCE DAILY 90 tablet 3  . metoprolol succinate (TOPROL-XL) 50 MG 24 hr tablet TAKE ONE TABLET BY MOUTH ONCE DAILY WITH  OR  IMMEDIATELY  FOLLOWING  A  MEAL 90 tablet 3  . Multiple Vitamins-Minerals (HM MULTIVITAMIN ADULT GUMMY PO) Take 1 capsule by mouth daily.    . chlorthalidone (HYGROTON) 25 MG tablet  Take 0.5 tablets (12.5 mg total) by mouth daily. 90 tablet 3   No current facility-administered medications for this visit.     PHYSICAL EXAMINATION: ECOG PERFORMANCE STATUS: 0 - Asymptomatic  Vitals:   06/18/16 0939  BP: (!) 183/52  Pulse: (!) 54  Resp: 20  Temp: 98 F (36.7 C)   Filed Weights   06/18/16 0939  Weight: 103 lb 4.8 oz (46.9 kg)    GENERAL:alert, no distress and comfortable SKIN: skin color, texture, turgor are normal, no rashes or significant lesions EYES: normal, Conjunctiva are pink and non-injected, sclera clear OROPHARYNX:no exudate, no erythema and lips, buccal mucosa, and tongue normal  NECK: supple, thyroid normal size, non-tender, without nodularity LYMPH:  no palpable lymphadenopathy in the cervical, axillary or inguinal LUNGS: clear to auscultation and percussion with normal breathing effort HEART: regular rate & rhythm and no murmurs and no lower extremity edema ABDOMEN:abdomen soft, non-tender and normal bowel sounds Musculoskeletal:no cyanosis of digits and no clubbing  NEURO: alert & oriented x 3 with fluent speech, no focal motor/sensory deficits  LABORATORY DATA:  I have reviewed the data as listed    Component Value Date/Time   NA 141 06/17/2016 0852   K 4.7 06/17/2016 0852   CL 96 (L) 12/06/2015 0800   CL 105 05/20/2012 0823   CO2 29 06/17/2016 0852   GLUCOSE 81 06/17/2016 0852   GLUCOSE 70 05/20/2012 0823   BUN 13.0 06/17/2016 0852   CREATININE 1.1 06/17/2016 0852   CALCIUM 10.8 (H) 06/17/2016 0852   PROT 6.5 06/17/2016 0852   ALBUMIN 3.9 06/17/2016 0852   AST 26 06/17/2016 0852   ALT 13 06/17/2016 0852   ALKPHOS 56 06/17/2016 0852   BILITOT 0.83 06/17/2016 0852   GFRNONAA 51 (L) 01/21/2014 0420   GFRAA 60 (L) 01/21/2014 0420    No results found for: SPEP, UPEP  Lab Results  Component Value Date   WBC 10.0 06/17/2016   NEUTROABS 2.6 06/17/2016   HGB 13.8 06/17/2016   HCT 41.7 06/17/2016   MCV 89.9 06/17/2016   PLT 174  06/17/2016      Chemistry      Component Value Date/Time   NA 141 06/17/2016 0852   K 4.7 06/17/2016 0852   CL 96 (L) 12/06/2015 0800   CL 105 05/20/2012 0823   CO2 29 06/17/2016 0852   BUN 13.0 06/17/2016 0852   CREATININE 1.1 06/17/2016 0852      Component Value Date/Time   CALCIUM 10.8 (H) 06/17/2016 0852   ALKPHOS 56 06/17/2016 0852   AST 26 06/17/2016 0852   ALT 13 06/17/2016 0852   BILITOT 0.83 06/17/2016 0852       RADIOGRAPHIC STUDIES: I have personally reviewed the radiological images as listed and agreed with the findings in the report. Ct Chest W Contrast  Result Date: 06/17/2016 CLINICAL DATA:  76 year old female with history of chronic lymphocytic leukemia and thrombocytopenia. Evaluate response to therapy. Followup study. EXAM: CT CHEST, ABDOMEN, AND PELVIS WITH CONTRAST TECHNIQUE: Multidetector CT imaging of the chest, abdomen and pelvis was performed following the standard protocol  during bolus administration of intravenous contrast. CONTRAST:  154mL ISOVUE-300 IOPAMIDOL (ISOVUE-300) INJECTION 61% COMPARISON:  CT of the chest, abdomen and pelvis 09/18/2014. FINDINGS: CT CHEST FINDINGS Cardiovascular: Heart size is mildly enlarged. There is no significant pericardial fluid, thickening or pericardial calcification. There is aortic atherosclerosis, as well as atherosclerosis of the great vessels of the mediastinum and the coronary arteries, including calcified atherosclerotic plaque in the left main, left anterior descending, left circumflex and right coronary arteries. Status post median sternotomy for CABG including LIMA to the LAD. Patient is also status post aortic valve replacement with a stented bioprosthesis. Mediastinum/Nodes: No pathologically enlarged mediastinal or hilar lymph nodes. No supraclavicular lymphadenopathy. Esophagus is unremarkable in appearance. No axillary lymphadenopathy. Lungs/Pleura: Scattered areas of linear scarring, most evident in the lower  lobes of the lungs bilaterally. No acute consolidative airspace disease. No pleural effusions. No suspicious appearing pulmonary nodules or masses. Musculoskeletal: Median sternotomy wires. There are no aggressive appearing lytic or blastic lesions noted in the visualized portions of the skeleton. ABDOMEN PELVIS FINDINGS Hepatobiliary: Pneumobilia, suggesting prior sphincterotomy. Status post cholecystectomy. No suspicious cystic or solid hepatic lesions. Pancreas: No pancreatic mass. No pancreatic ductal dilatation. No pancreatic or peripancreatic fluid or inflammatory changes. Spleen: Unremarkable. Adrenals/Urinary Tract: Subcentimeter low-attenuation lesions in the right kidney are too small to definitively characterize, but are statistically likely to represent tiny cysts. Atrophy of the left kidney. 3 mm nonobstructive calculus in the lower pole collecting system of left kidney. No hydroureteronephrosis. Urinary bladder is normal in appearance. Bilateral adrenal glands are normal in appearance. Stomach/Bowel: The appearance of the stomach is normal. There is no pathologic dilatation of small bowel or colon. A few scattered colonic diverticulae are noted, without surrounding inflammatory changes to suggest an acute diverticulitis at this time. The appendix is not confidently identified and may be surgically absent. Regardless, there are no inflammatory changes noted adjacent to the cecum to suggest the presence of an acute appendicitis at this time. Vascular/Lymphatic: Aortic atherosclerosis, without evidence of aneurysm or dissection in the abdominal or pelvic vasculature. No lymphadenopathy noted in the abdomen or pelvis. Reproductive: Status posthysterectomy. Ovaries are not confidently identified may be surgically absent or atrophic. Other: No significant volume of ascites.  No pneumoperitoneum. Musculoskeletal: There are no aggressive appearing lytic or blastic lesions noted in the visualized portions of  the skeleton. IMPRESSION: 1. No lymphadenopathy noted in the chest, abdomen or pelvis to suggest recurrent disease. 2. Aortic atherosclerosis, in addition to left main and 3 vessel coronary artery disease. Status post median sternotomy for CABG including LIMA to the LAD. 3. 3 mm nonobstructive calculus in the lower pole collecting system of the left kidney. Mild atrophy of the left kidney. 4. Mild colonic diverticulosis, without evidence of acute diverticulitis at this time. 5. Additional incidental findings, as above. Electronically Signed   By: Vinnie Langton M.D.   On: 06/17/2016 14:11   Ct Abdomen Pelvis W Contrast  Result Date: 06/17/2016 CLINICAL DATA:  76 year old female with history of chronic lymphocytic leukemia and thrombocytopenia. Evaluate response to therapy. Followup study. EXAM: CT CHEST, ABDOMEN, AND PELVIS WITH CONTRAST TECHNIQUE: Multidetector CT imaging of the chest, abdomen and pelvis was performed following the standard protocol during bolus administration of intravenous contrast. CONTRAST:  160mL ISOVUE-300 IOPAMIDOL (ISOVUE-300) INJECTION 61% COMPARISON:  CT of the chest, abdomen and pelvis 09/18/2014. FINDINGS: CT CHEST FINDINGS Cardiovascular: Heart size is mildly enlarged. There is no significant pericardial fluid, thickening or pericardial calcification. There is aortic atherosclerosis, as well  as atherosclerosis of the great vessels of the mediastinum and the coronary arteries, including calcified atherosclerotic plaque in the left main, left anterior descending, left circumflex and right coronary arteries. Status post median sternotomy for CABG including LIMA to the LAD. Patient is also status post aortic valve replacement with a stented bioprosthesis. Mediastinum/Nodes: No pathologically enlarged mediastinal or hilar lymph nodes. No supraclavicular lymphadenopathy. Esophagus is unremarkable in appearance. No axillary lymphadenopathy. Lungs/Pleura: Scattered areas of linear  scarring, most evident in the lower lobes of the lungs bilaterally. No acute consolidative airspace disease. No pleural effusions. No suspicious appearing pulmonary nodules or masses. Musculoskeletal: Median sternotomy wires. There are no aggressive appearing lytic or blastic lesions noted in the visualized portions of the skeleton. ABDOMEN PELVIS FINDINGS Hepatobiliary: Pneumobilia, suggesting prior sphincterotomy. Status post cholecystectomy. No suspicious cystic or solid hepatic lesions. Pancreas: No pancreatic mass. No pancreatic ductal dilatation. No pancreatic or peripancreatic fluid or inflammatory changes. Spleen: Unremarkable. Adrenals/Urinary Tract: Subcentimeter low-attenuation lesions in the right kidney are too small to definitively characterize, but are statistically likely to represent tiny cysts. Atrophy of the left kidney. 3 mm nonobstructive calculus in the lower pole collecting system of left kidney. No hydroureteronephrosis. Urinary bladder is normal in appearance. Bilateral adrenal glands are normal in appearance. Stomach/Bowel: The appearance of the stomach is normal. There is no pathologic dilatation of small bowel or colon. A few scattered colonic diverticulae are noted, without surrounding inflammatory changes to suggest an acute diverticulitis at this time. The appendix is not confidently identified and may be surgically absent. Regardless, there are no inflammatory changes noted adjacent to the cecum to suggest the presence of an acute appendicitis at this time. Vascular/Lymphatic: Aortic atherosclerosis, without evidence of aneurysm or dissection in the abdominal or pelvic vasculature. No lymphadenopathy noted in the abdomen or pelvis. Reproductive: Status posthysterectomy. Ovaries are not confidently identified may be surgically absent or atrophic. Other: No significant volume of ascites.  No pneumoperitoneum. Musculoskeletal: There are no aggressive appearing lytic or blastic lesions  noted in the visualized portions of the skeleton. IMPRESSION: 1. No lymphadenopathy noted in the chest, abdomen or pelvis to suggest recurrent disease. 2. Aortic atherosclerosis, in addition to left main and 3 vessel coronary artery disease. Status post median sternotomy for CABG including LIMA to the LAD. 3. 3 mm nonobstructive calculus in the lower pole collecting system of the left kidney. Mild atrophy of the left kidney. 4. Mild colonic diverticulosis, without evidence of acute diverticulitis at this time. 5. Additional incidental findings, as above. Electronically Signed   By: Vinnie Langton M.D.   On: 06/17/2016 14:11    ASSESSMENT & PLAN:  CLL (chronic lymphocytic leukemia) The patient has remarkable response to treatment with complete resolution of lymphadenopathy on CT However, she has persistent lymphocytosis detected in her peripheral blood which suggests she has minimal residual disease We discussed the risk and benefit of discontinuation of treatment versus continue ibrutinib for a few more months Ultimately, after extensive discussion, she is in agreement to continue ibrutinib until the end of the year I will see her back in the future for further follow-up at the end of the year  Essential hypertension She has poorly controlled hypertension She will continue medical management as directed by her cardiologist I suspect part of the hypertension could be related to anxiety  Hypercalcemia She has mild hypercalcemia This is likely due to her fasting state We will observe only and recheck blood work in the next visit   Orders Placed This  Encounter  Procedures  . Comprehensive metabolic panel    Standing Status:   Future    Standing Expiration Date:   07/24/2017   All questions were answered. The patient knows to call the clinic with any problems, questions or concerns. No barriers to learning was detected. I spent 15 minutes counseling the patient face to face. The total time  spent in the appointment was 20 minutes and more than 50% was on counseling and review of test results     Heath Lark, MD 06/19/2016 1:50 PM

## 2016-06-19 NOTE — Assessment & Plan Note (Signed)
The patient has remarkable response to treatment with complete resolution of lymphadenopathy on CT However, she has persistent lymphocytosis detected in her peripheral blood which suggests she has minimal residual disease We discussed the risk and benefit of discontinuation of treatment versus continue ibrutinib for a few more months Ultimately, after extensive discussion, she is in agreement to continue ibrutinib until the end of the year I will see her back in the future for further follow-up at the end of the year

## 2016-06-20 NOTE — Progress Notes (Signed)
I spoke with the pt and she has a pending appointment with Dr Burt Knack on 07/07/16.  The pt will monitor her BP at home and bring readings into this appointment for review. The pt states that her BP is better at home.   Nicole Mercado 9:49 AM 06/20/16

## 2016-06-30 ENCOUNTER — Ambulatory Visit: Payer: Medicare Other | Admitting: Hematology and Oncology

## 2016-07-07 ENCOUNTER — Ambulatory Visit (INDEPENDENT_AMBULATORY_CARE_PROVIDER_SITE_OTHER): Payer: Medicare Other | Admitting: Cardiovascular Disease

## 2016-07-07 ENCOUNTER — Encounter: Payer: Self-pay | Admitting: Cardiovascular Disease

## 2016-07-07 VITALS — BP 166/68 | HR 55 | Ht 60.0 in | Wt 100.8 lb

## 2016-07-07 DIAGNOSIS — I10 Essential (primary) hypertension: Secondary | ICD-10-CM

## 2016-07-07 DIAGNOSIS — I359 Nonrheumatic aortic valve disorder, unspecified: Secondary | ICD-10-CM

## 2016-07-07 MED ORDER — CHLORTHALIDONE 25 MG PO TABS: 25.0000 mg | ORAL_TABLET | Freq: Every day | ORAL | 3 refills | Status: DC

## 2016-07-07 MED FILL — IMBRUVICA 140 MG CAPSULE: 140 | 30 days supply | Qty: 90 | Fill #2

## 2016-07-07 NOTE — Progress Notes (Signed)
Cardiology Office Note Date:  07/07/2016   ID:  Nicole Mercado, DOB [birth year], MRN [id number]  PCP:  Kristie Cowman, MD  Cardiologist:  Sherren Mocha, MD    Chief Complaint  Patient presents with  . Follow-up    blood pressure     History of Present Illness: Nicole Mercado is a 76 y.o. female who presents for Follow-up evaluation. The patient is been followed for coronary and valvular disease. Comorbid conditions include CLL with chronic thrombocytopenia, hypertension, carotid stenosis with history of left carotid endarterectomy. She underwent aortic valve replacement for treatment of severe symptomatic aortic stenosis along with coronary bypass surgery in 2016. She was treated with a LIMA to LAD, saphenous vein graft to OM1, and saphenous pain graft to diagonal.  The patient is here with her husband today. She has been feeling really well with recent treatment of CLL. Her energy level is improved. She brings in home blood pressure readings which is consistently elevated with average readings in the 150s over 50s. Heart rate is generally running in the 50s. She denies lightheadedness or syncope. She complains of leg swelling but no orthopnea or PND. Chest pain. Overall is feeling well.  Past Medical History:  Diagnosis Date  . Aortic stenosis    a. Echocardiogram (01/11/14):  Mild LVH, EF 60-65%, Gr 1 DD, possible bicuspid AV, severe AS (mean 61 mmHg, peak 104 mmHg), mild MVP of post leaflet, mild MR, PASP 33 mmHg.;  b. 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 Arthritis   . Atrial fibrillation ([place])    post op after CABG+AVR >> Amiodarone  . CAD (coronary artery disease)    a. LHC (01/13/14):  dLM 70 extending into oLAD, mLAD 40-50, pD1 80, pD2 70, ostial/prox OM1 70 >> CABG (L-LAD, S-OM1, S-D1, S-D2)    . Carotid artery occlusion    a. s/p L CEA 2013;  b.  Carotid US (1/16):  Bilateral ICA 1-39%  . CLL (chronic lymphocytic leukemia) (HCC)    slow leukemia---Dr  Murinson  . H/O exercise stress  test    NSSTT  . Hx of cardiovascular stress test    a. Nuclear stress test That (4/13): Normal perfusion, EF 74%  . Hx of echocardiogram    Echo (2/16):  Mild LVH, EF 60-65%, no RWMA, Gr 2 DD, AVR ok (mean 9 mmHg), trivial AI, mild MR, mild LAE, PASP 40 mmHg  . Hypertension   . Pancreatitis    h/o  . Thrombocytopenia ([place]) 11/19/2010    Past Surgical History:  Procedure Laterality Date  . ABDOMINAL HYSTERECTOMY    . AORTIC VALVE REPLACEMENT [name] 01/17/2014   Procedure: AORTIC VALVE REPLACEMENT (AVR);  Surgeon: Gaye Pollack, MD;  Location: [facility];  Service: Open Heart Surgery;  Laterality: [name];  . APPENDECTOMY    . CAROTID ENDARTERECTOMY  06/09/11   LEFT  cea  . CESAREAN SECTION     FIVE  . CHOLECYSTECTOMY    . CORONARY ARTERY BYPASS GRAFT [name] 01/17/2014   Procedure: CORONARY ARTERY BYPASS GRAFTING (CABG)TIMES FOUR USING LEFT INTERNAL MAMMARY ARTERY AND RIGHT SAPHENOUS VEIN HARVESTED ENDOSCOPICALLY;  Surgeon: Gaye Pollack, MD;  Location: [facility];  Service: Open Heart Surgery;  Laterality: [name];  . ENDARTERECTOMY  06/09/2011   Procedure: ENDARTERECTOMY CAROTID;  Surgeon: Rosetta Posner, MD;  Location: [facility] OR;  Service: Vascular;  Laterality: Left;  left carotid endarterectomy with patch angioplasty  . LEFT AND RIGHT HEART CATHETERIZATION WITH CORONARY ANGIOGRAM [name] 01/13/2014   Procedure:  LEFT AND RIGHT HEART CATHETERIZATION WITH CORONARY ANGIOGRAM;  Surgeon: Jettie Booze, MD;  Location: [facility] CATH LAB;  Service: Cardiovascular;  Laterality: [name];  . TEE WITHOUT CARDIOVERSION [name] 01/17/2014   Procedure: TRANSESOPHAGEAL ECHOCARDIOGRAM (TEE);  Surgeon: Gaye Pollack, MD;  Location: [place];  Service: Open Heart Surgery;  Laterality: [name];  . TONSILLECTOMY      Current Outpatient Prescriptions  Medication Sig Dispense Refill  . acetaminophen (TYLENOL) 325 MG tablet Take 650 mg by mouth every 6 (six) hours as needed for mild pain or headache.     Marland Kitchen amLODipine (NORVASC) 10 MG tablet Take 1 tablet  (10 mg total) by mouth daily. 90 tablet 3  . amoxicillin (AMOXIL) 500 MG tablet Take 4 tablets by mouth one hour prior to dental procedure 8 tablet 2  . aspirin 81 MG tablet Take 81 mg by mouth daily.    Marland Kitchen atorvastatin (LIPITOR) 10 MG tablet TAKE ONE TABLET BY MOUTH ONCE DAILY AT  6PM 90 tablet 3  . ibrutinib (IMBRUVICA) 140 MG capsul Take 3 capsules (420 mg total) by mouth daily. 90 capsule 6  . lisinopril (PRINIVIL,ZESTRIL) 20 MG tablet TAKE ONE TABLET BY MOUTH ONCE DAILY 90 tablet 3  . metoprolol succinate (TOPROL-XL) 50 MG 24 hr tablet TAKE ONE TABLET BY MOUTH ONCE DAILY WITH  OR  IMMEDIATELY  FOLLOWING  A  MEAL 90 tablet 3  . Multiple Vitamins-Minerals (HM MULTIVITAMIN ADULT GUMMY PO) Take 1 capsule by mouth daily.    . chlorthalidone (HYGROTON) 25 MG tablet Take 1 tablet (25 mg total) by mouth daily. 90 tablet 3   No current facility-administered medications for this visit.     Allergies:   Patient has no known allergies.   Social History:  The patient  reports that she has never smoked. She has never used smokeless tobacco. She reports that she does not drink alcohol or use drugs.   Family History:  The patient's  family history includes Heart attack in her mother; Heart disease in her father and mother; Hypertension in her father, mother, sister, and son; Stroke in her paternal grandfather and paternal grandmother.    ROS:  Please see the history of present illness.  Otherwise, review of systems is positive for leg cramps/toe cramps.  All other systems are reviewed and negative.    PHYSICAL EXAM: VS:  BP (!) 166/68   Pulse (!) 55   Ht 5' (1.524 m)   Wt 100 lb 12.8 oz (45.7 kg)   BMI 19.69 kg/m  , BMI Body mass index is 19.69 kg/m. GEN: Well nourished, well developed, in no acute distress  HEENT: normal  Neck: no JVD, no masses. No carotid bruits Cardiac: RRR with grade 2/6 systolic ejection murmur at the right upper sternal border and grade 1/6 diastolic decrescendo murmur  at the left lower sternal border       Respiratory:  clear to auscultation bilaterally, normal work of breathing GI: soft, nontender, nondistended, + BS MS: no deformity or atrophy  Ext: Trace bilateral pretibial edema, pedal pulses 2+= bilaterally Skin: warm and dry, no rash Neuro:  Strength and sensation are intact Psych: euthymic mood, full affect  EKG:  EKG is ordered today. The ekg ordered today shows sinus bradycardia age-indeterminate septal infarct, otherwise normal  Recent Labs: 06/17/2016: ALT 13; BUN 13.0; Creatinine 1.1; HGB 13.8; Platelets 174; Potassium 4.7; Sodium 141   Lipid Panel     Component Value Date/Time   CHOL 140 05/01/2015 0920  TRIG 115 05/01/2015 0920   HDL 69 05/01/2015 0920   CHOLHDL 2.0 05/01/2015 0920   VLDL 23 05/01/2015 0920   LDLCALC 48 05/01/2015 0920      Wt Readings from Last 3 Encounters:  07/07/16 100 lb 12.8 oz (45.7 kg)  06/18/16 103 lb 4.8 oz (46.9 kg)  05/27/16 103 lb 14.4 oz (47.1 kg)     Cardiac Studies Reviewed: 2D Echo 09/24/2015: Study Conclusions  - Left ventricle: The cavity size was normal. There was mild   concentric hypertrophy. Systolic function was normal. The   estimated ejection fraction was in the range of 60% to 65%. Wall   motion was normal; there were no regional wall motion   abnormalities. Features are consistent with a pseudonormal left   ventricular filling pattern, with concomitant abnormal relaxation   and increased filling pressure (grade 2 diastolic dysfunction).   Doppler parameters are consistent with elevated ventricular   end-diastolic filling pressure. - Ventricular septum: Septal motion showed paradox. - Aortic valve: A bioprosthetic valve sits well in the aortic   position. There was mild regurgitation. Mean gradient (S): 12 mm   Hg. Peak gradient (S): 25 mm Hg. - Aortic root: The aortic root was normal in size. - Mitral valve: Structurally normal valve. There was mild   regurgitation. -  Left atrium: The atrium was mildly dilated. - Pulmonary arteries: Systolic pressure was mildly increased. PA   peak pressure: 35 mm Hg (S). - Inferior vena cava: The vessel was normal in size. - Pericardium, extracardiac: There was no pericardial effusion.  Impressions:  - There is no significant difference from the study on 09/28/2014.   Transortic gradients across the bioprosthetic aortic valve remain   within normal range.  ASSESSMENT AND PLAN: 1.  Coronary artery disease, native vessel: No symptoms of angina. Continue aspirin and a statin drug.  2. Hypertension, uncontrolled: Medications reviewed. Recommend increase chlorthalidone to 25 mg daily. Continue current doses of amlodipine, lisinopril, and metoprolol succinate. Continue to record all readings and call in his they remain elevated. Otherwise follow-up with Richardson Dopp in 6 months.  3. Aortic valve disease status post bioprosthetic aortic valve replacement: No changes on her exam. She has had normal gradients and mild perivalvular regurgitation. Will repeat an echocardiogram in about one year.  4. Hyperlipidemia: Continue atorvastatin.  Current medicines are reviewed with the patient today.  The patient does not have concerns regarding medicines.  Labs/ tests ordered today include:   Orders Placed This Encounter  Procedures  . EKG 12-Lead    Disposition:   FU 6 months with Richardson Dopp, 12 months with me  Signed, Sherren Mocha, MD  07/07/2016 [facility] [address], [place], [place]  [address] Phone: [phone]; Fax: [phone]

## 2016-07-07 NOTE — Patient Instructions (Signed)
Medication Instructions:  Your physician has recommended you make the following change in your medication:  1. START Chlorthalidone 25mg  take one tablet by mouth daily  Labwork: No new orders.   Testing/Procedures: No new orders.   Follow-Up: Your physician recommends that you schedule a follow-up appointment in: 6 MONTHS with Richardson Dopp PA-C   Any Other Special Instructions Will Be Listed Below (If Applicable).     If you need a refill on your cardiac medications before your next appointment, please call your pharmacy.

## 2016-07-14 ENCOUNTER — Telehealth: Payer: Self-pay | Admitting: Cardiovascular Disease

## 2016-07-14 NOTE — Telephone Encounter (Signed)
Spoke with patient who is reporting that since she increased her Chlorthalidone to 25 mg a day she has been having nausea that occurs right after and lasts approximately 5 mins.  She also c/o feeling weakness as well.  She has not checked her BP until today which she reports as being 173/60 and 159/56 with HR of 59 and 60 BPM.  She has not taken any Chlorthalidone at all today.  Pt is also asking about her Amlodipine.  She reports being told several months ago to decrease from 10 mg a day to 5 mg a day.  She has been taking 5 mg a day since she believes the end of April and not the 10 mg as listed in her chart.  She would like to know whether to stay on the 5 or increase back to 10 mg as well as what to  Do about the Chlorthalidone.  Advised I will review with Dr Burt Knack and call her back.

## 2016-07-14 NOTE — Telephone Encounter (Signed)
New Message   Pt c/o medication issue:  1. Name of Medication: Chlorthalidone  2. How are you currently taking this medication (dosage and times per day)? 25mg    3. Are you having a reaction (difficulty breathing--STAT)? nausea and weak; per pt regurgitated twice   4. What is your medication issue? Pt would like to speak with RN about medication .please call back to discuss

## 2016-07-15 NOTE — Telephone Encounter (Signed)
Discussed Dr Antionette Char  recommendations with patient, patient verbalized understanding.

## 2016-07-15 NOTE — Telephone Encounter (Signed)
Would go up on amlodipine to 10 mg daily and decrease the chlorthalidone back to 12.5 mg daily. thanks

## 2016-07-17 ENCOUNTER — Telehealth: Payer: Self-pay | Admitting: Cardiovascular Disease

## 2016-07-17 NOTE — Telephone Encounter (Signed)
Nicole Mercado is calling to find out if Dr. Burt Knack can prescribe another Blood pressure medication for her , because the one she is taking now (Chlorthalidone) is making her stomach upset and she only takes a half of pill. Please call   Thanks

## 2016-07-17 NOTE — Telephone Encounter (Signed)
Left message on machine for pt to contact the office.   

## 2016-07-17 NOTE — Telephone Encounter (Signed)
Would stay on higher dose of amlodipine and stop chlorthalidone. Will prescribe another medicine if BP is above goal. Please have her call in a few weeks with readings. thanks

## 2016-07-18 NOTE — Telephone Encounter (Signed)
I spoke with the pt and made her aware of recommended medication change.  Medication list updated.  The pt will continue to monitor her BP and if it is consistently greater than 140/90 then the pt will contact the office. Pt agreed with plan.

## 2016-07-21 ENCOUNTER — Other Ambulatory Visit: Payer: Self-pay | Admitting: Cardiovascular Disease

## 2016-08-12 MED FILL — IMBRUVICA 140 MG CAPSULE: 140 | 30 days supply | Qty: 90 | Fill #3

## 2016-08-18 ENCOUNTER — Other Ambulatory Visit: Payer: Self-pay | Admitting: Cardiovascular Disease

## 2016-08-18 DIAGNOSIS — I10 Essential (primary) hypertension: Secondary | ICD-10-CM

## 2016-09-03 ENCOUNTER — Encounter: Payer: Self-pay | Admitting: Family

## 2016-09-04 ENCOUNTER — Telehealth: Payer: Self-pay | Admitting: Pharmacy Technician

## 2016-09-04 MED FILL — IMBRUVICA 140 MG CAPSULE: 140 | 30 days supply | Qty: 90 | Fill #4

## 2016-09-04 NOTE — Telephone Encounter (Signed)
Oral Oncology Patient Advocate Encounter  Was successful in securing patient a $92 grant from Patient [facility]) to provide copayment coverage for her Imbruvica.  This will keep the out of pocket expense at $0.    I have spoken with the patient.    The billing information is as follows and has been shared with [facility].   Member ID: [id number] Group ID: [id number] RxBin: [id number] Dates of Eligibility: 06/06/2016 through 06/05/2017.    Fabio Asa. Melynda Keller, [place] Patient [place] [phone] 09/04/2016 12:09 PM

## 2016-09-09 ENCOUNTER — Encounter: Payer: Self-pay | Admitting: Physician Assistant

## 2016-09-09 ENCOUNTER — Ambulatory Visit (INDEPENDENT_AMBULATORY_CARE_PROVIDER_SITE_OTHER): Payer: Medicare Other | Admitting: Physician Assistant

## 2016-09-09 ENCOUNTER — Ambulatory Visit (HOSPITAL_COMMUNITY)
Admission: RE | Admit: 2016-09-09 | Discharge: 2016-09-09 | Disposition: A | Discharge status: Home / Self Care | Payer: Medicare Other | Source: Ambulatory Visit | Attending: Vascular Surgery | Admitting: Vascular Surgery

## 2016-09-09 VITALS — BP 181/69 | HR 51 | Temp 97.0°F | Resp 14 | Ht 60.0 in | Wt 100.0 lb

## 2016-09-09 DIAGNOSIS — I25709 Atherosclerosis of coronary artery bypass graft(s), unspecified, with unspecified angina pectoris: Secondary | ICD-10-CM | POA: Insufficient documentation

## 2016-09-09 DIAGNOSIS — I6529 Occlusion and stenosis of unspecified carotid artery: Secondary | ICD-10-CM | POA: Diagnosis not present

## 2016-09-09 DIAGNOSIS — Z9889 Other specified postprocedural states: Secondary | ICD-10-CM | POA: Insufficient documentation

## 2016-09-09 DIAGNOSIS — Z48812 Encounter for surgical aftercare following surgery on the circulatory system: Secondary | ICD-10-CM | POA: Diagnosis not present

## 2016-09-09 DIAGNOSIS — I6522 Occlusion and stenosis of left carotid artery: Secondary | ICD-10-CM

## 2016-09-09 DIAGNOSIS — I6521 Occlusion and stenosis of right carotid artery: Secondary | ICD-10-CM | POA: Insufficient documentation

## 2016-09-09 NOTE — Progress Notes (Signed)
History of Present Illness:  Patient is a 76 y.o. year old female who presents for evaluation of carotid stenosis s/p left CEA in 2013 by Dr. Donnetta Hutching.  The patient denies symptoms of TIA, amaurosis, or stroke.  The patient is currently on a daily aspirin antiplatelet therapy, a statin and anti hypertensive medication.  She is followed by Dr. Copper her cardiologist every 6 months.   Other past medical history: She underwent aortic valve replacement  for treatment of severe symptomatic aortic stenosis along with coronary bypass surgery in 2016. She was treated with a LIMA to LAD, saphenous vein graft to OM1, and saphenous pain graft to diagonal.    Past Medical History:  Diagnosis Date  . Aortic stenosis    a. Echocardiogram (01/11/14):  Mild LVH, EF 60-65%, Gr 1 DD, possible bicuspid AV, severe AS (mean 61 mmHg, peak 104 mmHg), mild MVP of post leaflet, mild MR, PASP 33 mmHg.;  b. 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 Arthritis   . Atrial fibrillation ([place])    post op after CABG+AVR >> Amiodarone  . CAD (coronary artery disease)    a. LHC (01/13/14):  dLM 70 extending into oLAD, mLAD 40-50, pD1 80, pD2 70, ostial/prox OM1 70 >> CABG (L-LAD, S-OM1, S-D1, S-D2)    . Carotid artery occlusion    a. s/p L CEA 2013;  b.  Carotid US (1/16):  Bilateral ICA 1-39%  . CLL (chronic lymphocytic leukemia) (HCC)    slow leukemia---Dr  Murinson  . H/O exercise stress test    NSSTT  . Hx of cardiovascular stress test    a. Nuclear stress test That (4/13): Normal perfusion, EF 74%  . Hx of echocardiogram    Echo (2/16):  Mild LVH, EF 60-65%, no RWMA, Gr 2 DD, AVR ok (mean 9 mmHg), trivial AI, mild MR, mild LAE, PASP 40 mmHg  . Hypertension   . Pancreatitis    h/o  . Thrombocytopenia ([place]) 11/19/2010    Past Surgical History:  Procedure Laterality Date  . ABDOMINAL HYSTERECTOMY    . AORTIC VALVE REPLACEMENT [name] 01/17/2014   Procedure: AORTIC VALVE REPLACEMENT (AVR);  Surgeon: Gaye Pollack, MD;   Location: [facility];  Service: Open Heart Surgery;  Laterality: [name];  . APPENDECTOMY    . CAROTID ENDARTERECTOMY  06/09/11   LEFT  cea  . CESAREAN SECTION     FIVE  . CHOLECYSTECTOMY    . CORONARY ARTERY BYPASS GRAFT [name] 01/17/2014   Procedure: CORONARY ARTERY BYPASS GRAFTING (CABG)TIMES FOUR USING LEFT INTERNAL MAMMARY ARTERY AND RIGHT SAPHENOUS VEIN HARVESTED ENDOSCOPICALLY;  Surgeon: Gaye Pollack, MD;  Location: [place];  Service: Open Heart Surgery;  Laterality: [name];  . ENDARTERECTOMY  06/09/2011   Procedure: ENDARTERECTOMY CAROTID;  Surgeon: Rosetta Posner, MD;  Location: [facility] OR;  Service: Vascular;  Laterality: Left;  left carotid endarterectomy with patch angioplasty  . LEFT AND RIGHT HEART CATHETERIZATION WITH CORONARY ANGIOGRAM [name] 01/13/2014   Procedure: LEFT AND RIGHT HEART CATHETERIZATION WITH CORONARY ANGIOGRAM;  Surgeon: Jettie Booze, MD;  Location: [facility] CATH LAB;  Service: Cardiovascular;  Laterality: [name];  . TEE WITHOUT CARDIOVERSION [name] 01/17/2014   Procedure: TRANSESOPHAGEAL ECHOCARDIOGRAM (TEE);  Surgeon: Gaye Pollack, MD;  Location: [place];  Service: Open Heart Surgery;  Laterality: [name];  . TONSILLECTOMY       Social History Social History  Substance Use Topics  . Smoking status: Never Smoker  . Smokeless tobacco: Never Used  .  Alcohol use No    Family History Family History  Problem Relation Age of Onset  . Heart disease Mother        in her 27s  . Hypertension Mother   . Heart attack Mother   . Heart disease Father   . Hypertension Father   . Hypertension Sister   . Hypertension Son   . Stroke Paternal Grandmother   . Stroke Paternal Grandfather     Allergies  No Known Allergies   Current Outpatient Prescriptions  Medication Sig Dispense Refill  . acetaminophen (TYLENOL) 325 MG tablet Take 650 mg by mouth every 6 (six) hours as needed for mild pain or headache.     Marland Kitchen amLODipine (NORVASC) 10 MG tablet Take 1 tablet (10 mg total) by mouth daily.    Marland Kitchen  aspirin 81 MG tablet Take 81 mg by mouth daily.    Marland Kitchen atorvastatin (LIPITOR) 10 MG tablet TAKE 1 TAB ONCE A DAY AT 6 PM 90 tablet 3  . ibrutinib (IMBRUVICA) 140 MG capsul Take 3 capsules (420 mg total) by mouth daily. 90 capsule 6  . lisinopril (PRINIVIL,ZESTRIL) 20 MG tablet TAKE ONE TABLET BY MOUTH ONCE DAILY 90 tablet 3  . metoprolol succinate (TOPROL-XL) 50 MG 24 hr tablet TAKE ONE TABLET BY MOUTH ONCE DAILY WITH  OR  IMMEDIATELY  FOLLOWING  A  MEAL 90 tablet 3  . Multiple Vitamins-Minerals (HM MULTIVITAMIN ADULT GUMMY PO) Take 1 capsule by mouth daily.    Marland Kitchen amoxicillin (AMOXIL) 500 MG tablet Take 4 tablets by mouth one hour prior to dental procedure (Patient not taking: Reported on 09/09/2016) 8 tablet 2   No current facility-administered medications for this visit.     ROS:   General:  No weight loss, Fever, chills  HEENT: No recent headaches, no nasal bleeding, no visual changes, no sore throat  Neurologic: No dizziness, blackouts, seizures. No recent symptoms of stroke or mini- stroke. No recent episodes of slurred speech, or temporary blindness.  Cardiac: No recent episodes of chest pain/pressure, no shortness of breath at rest.  No shortness of breath with exertion.  Denies history of atrial fibrillation or irregular heartbeat positive history of aortic valve replacement.  Vascular: No history of rest pain in feet.  No history of claudication.  No history of non-healing ulcer, No history of DVT   Pulmonary: No home oxygen, no productive cough, no hemoptysis,  No asthma or wheezing  Musculoskeletal:  [ x] Arthritis, [ ]  Low back pain,  [ ]  Joint pain  Hematologic:No history of hypercoagulable state.  No history of easy bleeding.  No history of anemia  Gastrointestinal: No hematochezia or melena,  No gastroesophageal reflux, no trouble swallowing  Urinary: [ ]  chronic Kidney disease, [ ]  on HD - [ ]  MWF or [ ]  TTHS, [ ]  Burning with urination, [ ]  Frequent urination, [ ]   Difficulty urinating;   Skin: No rashes  Psychological: No history of anxiety,  No history of depression   Physical Examination  Vitals:   09/09/16 1306 09/09/16 1312 09/09/16 1318  BP: (!) 160/68 (!) 181/61 (!) 181/69  Pulse: (!) 54 (!) 51 (!) 51  Resp: 14    Temp: (!) 97 F (36.1 C)    SpO2: 100%    Weight: 100 lb (45.4 kg)    Height: 5' (1.524 m)      Body mass index is 19.53 kg/m.  General:  Alert and oriented, no acute distress HEENT: Normal Neck: No bruit  or JVD Pulmonary: Clear to auscultation bilaterally Cardiac: Regular Rate and Rhythm positive murmur Gastrointestinal: Soft, non-tender, non-distended, no mass, no scars Skin: No rash Extremity Pulses:  2+ radial, brachial, femoral, dorsalis pedis, posterior tibial pulses bilaterally Musculoskeletal: No deformity or pitting  edema  Neurologic: Upper and lower extremity motor 5/5 and symmetric  DATA:  Carotid duplex  Right < 39% Left patent CEA < 39%    ASSESSMENT/PLAN:  Carotid stenosis s/p left CEA 2013 by Dr. Donnetta Hutching No signs or symptoms of TIA or stroke.  No significant change in exam or carotid duplex.  We reviewed stroke symptoms today and she states she understands.  We will schedule her a 1 year follow up with a repeat carotid duplex.       COLLINS, EMMA MAUREEN PA-C [facility]

## 2016-09-10 NOTE — Addendum Note (Signed)
Addended by: Lianne Cure A on: 09/10/2016 03:55 PM   Modules accepted: Orders

## 2016-09-26 ENCOUNTER — Emergency Department (HOSPITAL_BASED_OUTPATIENT_CLINIC_OR_DEPARTMENT_OTHER)
Admission: EM | Admit: 2016-09-26 | Discharge: 2016-09-26 | Disposition: A | Discharge status: Home / Self Care | Payer: Medicare Other | Attending: Emergency Medicine | Admitting: Emergency Medicine

## 2016-09-26 ENCOUNTER — Encounter (HOSPITAL_BASED_OUTPATIENT_CLINIC_OR_DEPARTMENT_OTHER): Payer: Self-pay | Admitting: Emergency Medicine

## 2016-09-26 ENCOUNTER — Emergency Department (HOSPITAL_BASED_OUTPATIENT_CLINIC_OR_DEPARTMENT_OTHER): Payer: Medicare Other

## 2016-09-26 DIAGNOSIS — Z951 Presence of aortocoronary bypass graft: Secondary | ICD-10-CM | POA: Diagnosis not present

## 2016-09-26 DIAGNOSIS — Y999 Unspecified external cause status: Secondary | ICD-10-CM | POA: Diagnosis not present

## 2016-09-26 DIAGNOSIS — Z79899 Other long term (current) drug therapy: Secondary | ICD-10-CM | POA: Diagnosis not present

## 2016-09-26 DIAGNOSIS — I10 Essential (primary) hypertension: Secondary | ICD-10-CM | POA: Insufficient documentation

## 2016-09-26 DIAGNOSIS — Z7982 Long term (current) use of aspirin: Secondary | ICD-10-CM | POA: Insufficient documentation

## 2016-09-26 DIAGNOSIS — Y9301 Activity, walking, marching and hiking: Secondary | ICD-10-CM | POA: Insufficient documentation

## 2016-09-26 DIAGNOSIS — Y92480 Sidewalk as the place of occurrence of the external cause: Secondary | ICD-10-CM | POA: Insufficient documentation

## 2016-09-26 DIAGNOSIS — S01112A Laceration without foreign body of left eyelid and periocular area, initial encounter: Secondary | ICD-10-CM | POA: Insufficient documentation

## 2016-09-26 DIAGNOSIS — I2510 Atherosclerotic heart disease of native coronary artery without angina pectoris: Secondary | ICD-10-CM | POA: Insufficient documentation

## 2016-09-26 DIAGNOSIS — W101XXA Fall (on)(from) sidewalk curb, initial encounter: Secondary | ICD-10-CM | POA: Insufficient documentation

## 2016-09-26 DIAGNOSIS — S0990XA Unspecified injury of head, initial encounter: Secondary | ICD-10-CM | POA: Diagnosis present

## 2016-09-26 NOTE — ED Triage Notes (Signed)
Pt misstepped and fell on curb.  Pt has abrasions and bruising to face.  Pt has abrasion to left lower leg and bruising to both arms.  Denies loc.  Denies blurry vision or N/V

## 2016-09-26 NOTE — ED Provider Notes (Signed)
[facility] DEPT [facility] Provider Note   CSN: [id number] Arrival date & time: 09/26/16  2031     History   Chief Complaint Chief Complaint  Patient presents with  . Fall    HPI Nicole Mercado is a 76 y.o. female.Chief complaint is fall.  HPI:  This is a 76 year old female. She stepped off of a curb. [address] and has abrasion to her face. She also has abrasion to her left wrist. She was up and break her fall with her hands. She does not have significant hand or wrist pain. Marked soft tissue swelling with ecchymosis around, and above the left eye, onto the left malar eminence, left temple. 3 cm laceration to the lateral aspect left eyebrow. No vision changes. No sig. Lost consciousness. Adamant that this was a trip and fall when had no symptoms prior to, or preceding the accident.  Past Medical History:  Diagnosis Date  . Aortic stenosis    a. Echocardiogram (01/11/14):  Mild LVH, EF 60-65%, Gr 1 DD, possible bicuspid AV, severe AS (mean 61 mmHg, peak 104 mmHg), mild MVP of post leaflet, mild MR, PASP 33 mmHg.;  b. 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 Arthritis   . Atrial fibrillation ([place])    post op after CABG+AVR >> Amiodarone  . CAD (coronary artery disease)    a. LHC (01/13/14):  dLM 70 extending into oLAD, mLAD 40-50, pD1 80, pD2 70, ostial/prox OM1 70 >> CABG (L-LAD, S-OM1, S-D1, S-D2)    . Carotid artery occlusion    a. s/p L CEA 2013;  b.  Carotid US (1/16):  Bilateral ICA 1-39%  . CLL (chronic lymphocytic leukemia) (HCC)    slow leukemia---Dr  Murinson  . H/O exercise stress test    NSSTT  . Hx of cardiovascular stress test    a. Nuclear stress test That (4/13): Normal perfusion, EF 74%  . Hx of echocardiogram    Echo (2/16):  Mild LVH, EF 60-65%, no RWMA, Gr 2 DD, AVR ok (mean 9 mmHg), trivial AI, mild MR, mild LAE, PASP 40 mmHg  . Hypertension   . Pancreatitis    h/o  . Thrombocytopenia ([place]) 11/19/2010    Patient Active Problem List   Diagnosis Date Noted  . Hypercalcemia  06/19/2016  . Drug-induced neutropenia ([place]) 05/27/2016  . Hematuria, gross 08/28/2015  . Easy bruising 05/31/2015  . Encounter for chemotherapy management 11/03/2014  . Essential hypertension 10/30/2014  . 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 S/P AVR 01/17/2014  . CAD (coronary artery disease), native coronary artery 01/16/2014  . Syncope 01/11/2014  . Aortic stenosis 01/11/2014  . Arrhythmia 01/11/2014  . Syncope and collapse 01/11/2014  . Faintness   . Aftercare following surgery of the circulatory system, [place] 08/18/2013  . Occlusion and stenosis of carotid artery without mention of cerebral infarction 06/03/2011  . CLL (chronic lymphocytic leukemia) ([place]) 03/21/2011  . Cardiac murmur 03/21/2011  . Carotid artery stenosis 03/21/2011  . Thrombocytopenia ([place]) 11/19/2010  . Lymphadenopathy 11/11/2010    Past Surgical History:  Procedure Laterality Date  . ABDOMINAL HYSTERECTOMY    . AORTIC VALVE REPLACEMENT [name] 01/17/2014   Procedure: AORTIC VALVE REPLACEMENT (AVR);  Surgeon: Gaye Pollack, MD;  Location: [facility];  Service: Open Heart Surgery;  Laterality: [name];  . APPENDECTOMY    . CAROTID ENDARTERECTOMY  06/09/11   LEFT  cea  . CESAREAN SECTION     FIVE  . CHOLECYSTECTOMY    . CORONARY ARTERY BYPASS GRAFT [name]  01/17/2014   Procedure: CORONARY ARTERY BYPASS GRAFTING (CABG)TIMES FOUR USING LEFT INTERNAL MAMMARY ARTERY AND RIGHT SAPHENOUS VEIN HARVESTED ENDOSCOPICALLY;  Surgeon: Gaye Pollack, MD;  Location: [facility] OR;  Service: Open Heart Surgery;  Laterality: [name];  . ENDARTERECTOMY  06/09/2011   Procedure: ENDARTERECTOMY CAROTID;  Surgeon: Rosetta Posner, MD;  Location: [facility] OR;  Service: Vascular;  Laterality: Left;  left carotid endarterectomy with patch angioplasty  . LEFT AND RIGHT HEART CATHETERIZATION WITH CORONARY ANGIOGRAM [name] 01/13/2014   Procedure: LEFT AND RIGHT HEART CATHETERIZATION WITH CORONARY ANGIOGRAM;  Surgeon: Jettie Booze, MD;  Location: [facility] C[facility];  Service:  Cardiovascular;  Laterality: [name];  . TEE WITHOUT CARDIOVERSION [name] 01/17/2014   Procedure: TRANSESOPHAGEAL ECHOCARDIOGRAM (TEE);  Surgeon: Gaye Pollack, MD;  Location: [facility];  Service: Open Heart Surgery;  Laterality: [name];  . TONSILLECTOMY      OB History    No data available       Home Medications    Prior to Admission medications   Medication Sig Start Date End Date Taking? Authorizing Provider  acetaminophen (TYLENOL) 325 MG tablet Take 650 mg by mouth every 6 (six) hours as needed for mild pain or headache.     [provider]  amLODipine (NORVASC) 10 MG tablet Take 1 tablet (10 mg total) by mouth daily. 07/15/16   Sherren Mocha, MD  amoxicillin (AMOXIL) 500 MG tablet Take 4 tablets by mouth one hour prior to dental procedure Patient not taking: Reported on 09/09/2016 04/11/15   Sherren Mocha, MD  aspirin 81 MG tablet Take 81 mg by mouth daily.    [provider]  atorvastatin (LIPITOR) 10 MG tablet TAKE 1 TAB ONCE A DAY AT 6 PM 07/22/16   Sherren Mocha, MD  ibrutinib (IMBRUVICA) 140 MG capsul Take 3 capsules (420 mg total) by mouth daily. 05/31/15   Heath Lark, MD  lisinopril (PRINIVIL,ZESTRIL) 20 MG tablet TAKE ONE TABLET BY MOUTH ONCE DAILY 08/18/16   Sherren Mocha, MD  metoprolol succinate (TOPROL-XL) 50 MG 24 hr tablet TAKE ONE TABLET BY MOUTH ONCE DAILY WITH  OR  IMMEDIATELY  FOLLOWING  A  MEAL 08/18/16   Sherren Mocha, MD  Multiple Vitamins-Minerals (HM MULTIVITAMIN ADULT GUMMY PO) Take 1 capsule by mouth daily.    [provider]    Family History Family History  Problem Relation Age of Onset  . Heart disease Mother        in her 53s  . Hypertension Mother   . Heart attack Mother   . Heart disease Father   . Hypertension Father   . Hypertension Sister   . Hypertension Son   . Stroke Paternal Grandmother   . Stroke Paternal Grandfather     Social History Social History  Substance Use Topics  . Smoking status: Never Smoker  .  Smokeless tobacco: Never Used  . Alcohol use No     Allergies   Patient has no known allergies.   Review of Systems Review of Systems  Constitutional: Negative for appetite change, chills, diaphoresis, fatigue and fever.  HENT: Negative for mouth sores, sore throat and trouble swallowing.        Abrasion contusions soft tissue swelling around the left eye laceration above left eye.  Eyes: Negative for visual disturbance.  Respiratory: Negative for cough, chest tightness, shortness of breath and wheezing.   Cardiovascular: Negative for chest pain.  Gastrointestinal: Negative for abdominal distention, abdominal pain, diarrhea, nausea and vomiting.  Endocrine: Negative for  polydipsia, polyphagia and polyuria.  Genitourinary: Negative for dysuria, frequency and hematuria.  Musculoskeletal: Negative for gait problem.  Skin: Negative for color change, pallor and rash.  Neurological: Negative for dizziness, syncope, light-headedness and headaches.  Hematological: Does not bruise/bleed easily.  Psychiatric/Behavioral: Negative for behavioral problems and confusion.     Physical Exam Updated Vital Signs BP (!) 172/50 (BP Location: Left Arm)   Pulse 61   Temp 98.2 F (36.8 C) (Oral)   Resp 18   Ht 5' (1.524 m)   Wt 45.4 kg (100 lb)   SpO2 99%   BMI 19.53 kg/m   Physical Exam  Constitutional: She is oriented to person, place, and time. She appears well-developed and well-nourished. No distress.  HENT:  Head: Normocephalic.    Eyes: Pupils are equal, round, and reactive to light. Conjunctivae are normal. No scleral icterus.  No blood over TMs, mastoids, or from ears nose or mouth. No proptosis or enophthalmos. Flexion movement. Symmetric reactive pupils. Reports normal vision. No apparent pupillary defect. Normal V1 to V3 distribution sensation. No step-off to the supraorbital ridge or malar eminence. No midline neck or back pain.  Neck: Normal range of motion. Neck supple. No  thyromegaly present.  Cardiovascular: Normal rate and regular rhythm.  Exam reveals no gallop and no friction rub.   No murmur heard. Pulmonary/Chest: Effort normal and breath sounds normal. No respiratory distress. She has no wheezes. She has no rales.  Abdominal: Soft. Bowel sounds are normal. She exhibits no distension. There is no tenderness. There is no rebound.  Musculoskeletal: Normal range of motion.  Area of ecchymosis on the volar left wrist she is nontender over this area and has normal range of motion.  Neurological: She is alert and oriented to person, place, and time.  Skin: Skin is warm and dry. No rash noted.  Psychiatric: She has a normal mood and affect. Her behavior is normal.     ED Treatments / Results  Labs (all labs ordered are listed, but only abnormal results are displayed) Labs Reviewed - No data to display  EKG  EKG Interpretation None       Radiology Ct Head Wo Contrast  Result Date: 09/26/2016 CLINICAL DATA:  Minor head trauma, fall, anticoagulated, facial injury EXAM: CT HEAD WITHOUT CONTRAST CT MAXILLOFACIAL WITHOUT CONTRAST TECHNIQUE: Multidetector CT imaging of the head and maxillofacial structures were performed using the standard protocol without intravenous contrast. Multiplanar CT image reconstructions of the maxillofacial structures were also generated. COMPARISON:  01/11/2014 FINDINGS: CT HEAD FINDINGS Brain: Stable brain atrophy and chronic microvascular ischemic changes throughout the periventricular white matter. No acute intracranial hemorrhage, mass lesion, new infarction, midline shift, herniation, hydrocephalus, or extra-axial fluid collection all. Cisterns are patent. Cerebellar atrophy as well. Vascular: No hyperdense vessel or unexpected calcification. Skull: Normal. Negative for fracture or focal lesion. Other: None. CT MAXILLOFACIAL FINDINGS Osseous: No acute facial bony trauma or fracture. Degenerative changes of the temporomandibular  joints bilaterally. Orbits are intact. No blow-out fracture. Orbits: Negative. No traumatic or inflammatory finding. Sinuses: Clear. Soft tissues: Left facial anterior orbital and maxillary soft tissue bruising. IMPRESSION: Stable brain atrophy and chronic white matter microvascular changes. No acute intracranial abnormality. Left facial anterior orbital and maxillary soft tissue bruising without acute facial bony trauma. Electronically Signed   By: Jerilynn Mages.  Shick M.D.   On: 09/26/2016 22:03   Ct Maxillofacial Wo Contrast  Result Date: 09/26/2016 CLINICAL DATA:  Minor head trauma, fall, anticoagulated, facial injury EXAM: CT HEAD WITHOUT  CONTRAST CT MAXILLOFACIAL WITHOUT CONTRAST TECHNIQUE: Multidetector CT imaging of the head and maxillofacial structures were performed using the standard protocol without intravenous contrast. Multiplanar CT image reconstructions of the maxillofacial structures were also generated. COMPARISON:  01/11/2014 FINDINGS: CT HEAD FINDINGS Brain: Stable brain atrophy and chronic microvascular ischemic changes throughout the periventricular white matter. No acute intracranial hemorrhage, mass lesion, new infarction, midline shift, herniation, hydrocephalus, or extra-axial fluid collection all. Cisterns are patent. Cerebellar atrophy as well. Vascular: No hyperdense vessel or unexpected calcification. Skull: Normal. Negative for fracture or focal lesion. Other: None. CT MAXILLOFACIAL FINDINGS Osseous: No acute facial bony trauma or fracture. Degenerative changes of the temporomandibular joints bilaterally. Orbits are intact. No blow-out fracture. Orbits: Negative. No traumatic or inflammatory finding. Sinuses: Clear. Soft tissues: Left facial anterior orbital and maxillary soft tissue bruising. IMPRESSION: Stable brain atrophy and chronic white matter microvascular changes. No acute intracranial abnormality. Left facial anterior orbital and maxillary soft tissue bruising without acute facial  bony trauma. Electronically Signed   By: Jerilynn Mages.  Shick M.D.   On: 09/26/2016 22:03    Procedures Procedures (including critical care time)  Medications Ordered in ED Medications - No data to display   Initial Impression / Assessment and Plan / ED Course  I have reviewed the triage vital signs and the nursing notes.  Pertinent labs & imaging results that were available during my care of the patient were reviewed by me and considered in my medical decision making (see chart for details).    LACERATION REPAIR Performed by: Lolita Patella Authorized by: Lolita Patella Consent: Verbal consent obtained. Risks and benefits: risks, benefits and alternatives were discussed Consent given by: patient Patient identity confirmed: provided demographic data Prepped and Draped in normal sterile fashion Wound explored  Laceration Location: Lt lateral eyebrow  Laceration Length: 3cm  No Foreign Bodies seen or palpated  Anesthesia: local infiltration  Local anesthetic: lidocaine 1% c epinephrine  Anesthetic total: 3 ml  Irrigation method: syringe Amount of cleaning: standard  Skin closure: 4-0 nylon  Number of sutures: 4  Technique: simple  Patient tolerance: Patient tolerated the procedure well with no immediate complications.  Reassuring scans. Laceration repair.  Plan:  discharge home. SR:  5-7 days   Final Clinical Impressions(s) / ED Diagnoses   Final diagnoses:  Laceration of left eyebrow, initial encounter    New Prescriptions Discharge Medication List as of 09/26/2016 10:29 PM       Tanna Furry, MD 09/27/16 0003

## 2016-09-26 NOTE — ED Notes (Signed)
Patient is A & O x4.  She understood discharge instructions. 

## 2016-09-26 NOTE — Discharge Instructions (Signed)
Suture removal in 5-7 days.  Use antibiotic ointment daily.  Leave the Ace wrap on until tomorrow.

## 2016-10-02 ENCOUNTER — Emergency Department (HOSPITAL_BASED_OUTPATIENT_CLINIC_OR_DEPARTMENT_OTHER)
Admission: EM | Admit: 2016-10-02 | Discharge: 2016-10-02 | Disposition: A | Discharge status: Home / Self Care | Payer: Medicare Other | Attending: Emergency Medicine | Admitting: Emergency Medicine

## 2016-10-02 ENCOUNTER — Encounter (HOSPITAL_BASED_OUTPATIENT_CLINIC_OR_DEPARTMENT_OTHER): Payer: Self-pay | Admitting: *Deleted

## 2016-10-02 DIAGNOSIS — I10 Essential (primary) hypertension: Secondary | ICD-10-CM | POA: Diagnosis not present

## 2016-10-02 DIAGNOSIS — Z952 Presence of prosthetic heart valve: Secondary | ICD-10-CM | POA: Insufficient documentation

## 2016-10-02 DIAGNOSIS — S01112D Laceration without foreign body of left eyelid and periocular area, subsequent encounter: Secondary | ICD-10-CM | POA: Insufficient documentation

## 2016-10-02 DIAGNOSIS — Z856 Personal history of leukemia: Secondary | ICD-10-CM | POA: Diagnosis not present

## 2016-10-02 DIAGNOSIS — Z4802 Encounter for removal of sutures: Secondary | ICD-10-CM | POA: Insufficient documentation

## 2016-10-02 DIAGNOSIS — Z79899 Other long term (current) drug therapy: Secondary | ICD-10-CM | POA: Insufficient documentation

## 2016-10-02 DIAGNOSIS — I2510 Atherosclerotic heart disease of native coronary artery without angina pectoris: Secondary | ICD-10-CM | POA: Insufficient documentation

## 2016-10-02 DIAGNOSIS — Z951 Presence of aortocoronary bypass graft: Secondary | ICD-10-CM | POA: Insufficient documentation

## 2016-10-02 DIAGNOSIS — X58XXXA Exposure to other specified factors, initial encounter: Secondary | ICD-10-CM | POA: Diagnosis not present

## 2016-10-02 DIAGNOSIS — Z7982 Long term (current) use of aspirin: Secondary | ICD-10-CM | POA: Insufficient documentation

## 2016-10-02 NOTE — ED Provider Notes (Signed)
[facility] DEPT [facility] Provider Note   CSN: [id number] Arrival date & time: 10/02/16  0847     History   Chief Complaint Chief Complaint  Patient presents with  . Suture / Staple Removal    HPI Nicole Mercado is a 76 y.o. female.  HPI  76 year old female here for suture removal of left eyebrow laceration that was closed 6 days ago. Endorsing settling ecchymosis. No surrounding erythema, swelling, pain.  Past Medical History:  Diagnosis Date  . Aortic stenosis    a. Echocardiogram (01/11/14):  Mild LVH, EF 60-65%, Gr 1 DD, possible bicuspid AV, severe AS (mean 61 mmHg, peak 104 mmHg), mild MVP of post leaflet, mild MR, PASP 33 mmHg.;  b. 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 Arthritis   . Atrial fibrillation ([place])    post op after CABG+AVR >> Amiodarone  . CAD (coronary artery disease)    a. LHC (01/13/14):  dLM 70 extending into oLAD, mLAD 40-50, pD1 80, pD2 70, ostial/prox OM1 70 >> CABG (L-LAD, S-OM1, S-D1, S-D2)    . Carotid artery occlusion    a. s/p L CEA 2013;  b.  Carotid US (1/16):  Bilateral ICA 1-39%  . CLL (chronic lymphocytic leukemia) (HCC)    slow leukemia---Dr  Murinson  . H/O exercise stress test    NSSTT  . Hx of cardiovascular stress test    a. Nuclear stress test That (4/13): Normal perfusion, EF 74%  . Hx of echocardiogram    Echo (2/16):  Mild LVH, EF 60-65%, no RWMA, Gr 2 DD, AVR ok (mean 9 mmHg), trivial AI, mild MR, mild LAE, PASP 40 mmHg  . Hypertension   . Pancreatitis    h/o  . Thrombocytopenia ([place]) 11/19/2010    Patient Active Problem List   Diagnosis Date Noted  . Hypercalcemia 06/19/2016  . Drug-induced neutropenia ([place]) 05/27/2016  . Hematuria, gross 08/28/2015  . Easy bruising 05/31/2015  . Encounter for chemotherapy management 11/03/2014  . Essential hypertension 10/30/2014  . 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 S/P AVR 01/17/2014  . CAD (coronary artery disease), native coronary artery 01/16/2014  . Syncope 01/11/2014  .  Aortic stenosis 01/11/2014  . Arrhythmia 01/11/2014  . Syncope and collapse 01/11/2014  . Faintness   . Aftercare following surgery of the circulatory system, [place] 08/18/2013  . Occlusion and stenosis of carotid artery without mention of cerebral infarction 06/03/2011  . CLL (chronic lymphocytic leukemia) ([place]) 03/21/2011  . Cardiac murmur 03/21/2011  . Carotid artery stenosis 03/21/2011  . Thrombocytopenia ([place]) 11/19/2010  . Lymphadenopathy 11/11/2010    Past Surgical History:  Procedure Laterality Date  . ABDOMINAL HYSTERECTOMY    . AORTIC VALVE REPLACEMENT [name] 01/17/2014   Procedure: AORTIC VALVE REPLACEMENT (AVR);  Surgeon: Gaye Pollack, MD;  Location: [facility];  Service: Open Heart Surgery;  Laterality: [name];  . APPENDECTOMY    . CAROTID ENDARTERECTOMY  06/09/11   LEFT  cea  . CESAREAN SECTION     FIVE  . CHOLECYSTECTOMY    . CORONARY ARTERY BYPASS GRAFT [name] 01/17/2014   Procedure: CORONARY ARTERY BYPASS GRAFTING (CABG)TIMES FOUR USING LEFT INTERNAL MAMMARY ARTERY AND RIGHT SAPHENOUS VEIN HARVESTED ENDOSCOPICALLY;  Surgeon: Gaye Pollack, MD;  Location: [facility];  Service: Open Heart Surgery;  Laterality: [name];  . ENDARTERECTOMY  06/09/2011   Procedure: ENDARTERECTOMY CAROTID;  Surgeon: Rosetta Posner, MD;  Location: [facility] OR;  Service: Vascular;  Laterality: Left;  left carotid endarterectomy with patch angioplasty  .  LEFT AND RIGHT HEART CATHETERIZATION WITH CORONARY ANGIOGRAM [name] 01/13/2014   Procedure: LEFT AND RIGHT HEART CATHETERIZATION WITH CORONARY ANGIOGRAM;  Surgeon: Jettie Booze, MD;  Location: [facility] CATH LAB;  Service: Cardiovascular;  Laterality: [name];  . TEE WITHOUT CARDIOVERSION [name] 01/17/2014   Procedure: TRANSESOPHAGEAL ECHOCARDIOGRAM (TEE);  Surgeon: Gaye Pollack, MD;  Location: [facility];  Service: Open Heart Surgery;  Laterality: [name];  . TONSILLECTOMY      OB History    No data available       Home Medications    Prior to Admission medications   Medication Sig Start  Date End Date Taking? Authorizing Provider  acetaminophen (TYLENOL) 325 MG tablet Take 650 mg by mouth every 6 (six) hours as needed for mild pain or headache.     [provider]  amLODipine (NORVASC) 10 MG tablet Take 1 tablet (10 mg total) by mouth daily. 07/15/16   Sherren Mocha, MD  amoxicillin (AMOXIL) 500 MG tablet Take 4 tablets by mouth one hour prior to dental procedure Patient not taking: Reported on 09/09/2016 04/11/15   Sherren Mocha, MD  aspirin 81 MG tablet Take 81 mg by mouth daily.    [provider]  atorvastatin (LIPITOR) 10 MG tablet TAKE 1 TAB ONCE A DAY AT 6 PM 07/22/16   Sherren Mocha, MD  ibrutinib (IMBRUVICA) 140 MG capsul Take 3 capsules (420 mg total) by mouth daily. 05/31/15   Heath Lark, MD  lisinopril (PRINIVIL,ZESTRIL) 20 MG tablet TAKE ONE TABLET BY MOUTH ONCE DAILY 08/18/16   Sherren Mocha, MD  metoprolol succinate (TOPROL-XL) 50 MG 24 hr tablet TAKE ONE TABLET BY MOUTH ONCE DAILY WITH  OR  IMMEDIATELY  FOLLOWING  A  MEAL 08/18/16   Sherren Mocha, MD  Multiple Vitamins-Minerals (HM MULTIVITAMIN ADULT GUMMY PO) Take 1 capsule by mouth daily.    [provider]    Family History Family History  Problem Relation Age of Onset  . Heart disease Mother        in her 73s  . Hypertension Mother   . Heart attack Mother   . Heart disease Father   . Hypertension Father   . Hypertension Sister   . Hypertension Son   . Stroke Paternal Grandmother   . Stroke Paternal Grandfather     Social History Social History  Substance Use Topics  . Smoking status: Never Smoker  . Smokeless tobacco: Never Used  . Alcohol use No     Allergies   Patient has no known allergies.   Review of Systems Review of Systems   Physical Exam Updated Vital Signs BP (!) 177/66 (BP Location: Right Arm)   Pulse (!) 58   Temp 97.8 F (36.6 C) (Oral)   Resp 16   Ht 5' (1.524 m)   Wt 45.4 kg (100 lb)   SpO2 100%   BMI 19.53 kg/m   Physical Exam    Constitutional: She is oriented to person, place, and time. She appears well-developed and well-nourished. No distress.  HENT:  Head: Normocephalic and atraumatic.    Right Ear: External ear normal.  Left Ear: External ear normal.  Nose: Nose normal.  Eyes: Conjunctivae and EOM are normal. No scleral icterus.  Neck: Normal range of motion and phonation normal.  Cardiovascular: Normal rate and regular rhythm.   Pulmonary/Chest: Effort normal. No stridor. No respiratory distress.  Abdominal: She exhibits no distension.  Musculoskeletal: Normal range of motion. She exhibits no edema.  Neurological: She is alert  and oriented to person, place, and time.  Skin: She is not diaphoretic.  Psychiatric: She has a normal mood and affect. Her behavior is normal.  Vitals reviewed.    ED Treatments / Results  Labs (all labs ordered are listed, but only abnormal results are displayed) Labs Reviewed - No data to display  EKG  EKG Interpretation None       Radiology No results found.  Procedures .Suture Removal Date/Time: 10/02/2016 9:40 AM Performed by: Fatima Blank Authorized by: Fatima Blank   Consent:    Consent obtained:  Verbal   Consent given by:  Patient   Risks discussed:  Wound separation Location:    Location:  Head/neck   Head/neck location:  Eyebrow   Eyebrow location:  L eyebrow Procedure details:    Wound appearance:  No signs of infection   Number of sutures removed:  4 Post-procedure details:    Patient tolerance of procedure:  Tolerated well, no immediate complications   (including critical care time)  Medications Ordered in ED Medications - No data to display   Initial Impression / Assessment and Plan / ED Course  I have reviewed the triage vital signs and the nursing notes.  Pertinent labs & imaging results that were available during my care of the patient were reviewed by me and considered in my medical decision making (see  chart for details).     Sutures removed.   The patient is safe for discharge with strict return precautions.   Final Clinical Impressions(s) / ED Diagnoses   Final diagnoses:  Visit for suture removal   Disposition: Discharge  Condition: Good  I have discussed the results, Dx and Tx plan with the patient who expressed understanding and agree(s) with the plan. Discharge instructions discussed at great length. The patient was given strict return precautions who verbalized understanding of the instructions. No further questions at time of discharge.    New Prescriptions   No medications on file    Follow Up: Kristie Cowman, MD [place] [id number] [phone]         Fatima Blank, MD 10/02/16 [phone]

## 2016-10-02 NOTE — ED Triage Notes (Signed)
Pt requests removal of sutures placed to left face one week ago. Sutures are well healed, dry and intact.

## 2016-10-07 MED FILL — IMBRUVICA 140 MG CAPSULE: 140 | 30 days supply | Qty: 90 | Fill #5

## 2016-10-30 MED FILL — IMBRUVICA 140 MG CAPSULE: 140 | 30 days supply | Qty: 90 | Fill #6

## 2016-11-01 ENCOUNTER — Other Ambulatory Visit: Payer: Self-pay | Admitting: Cardiovascular Disease

## 2016-11-01 DIAGNOSIS — I10 Essential (primary) hypertension: Secondary | ICD-10-CM

## 2016-11-24 ENCOUNTER — Telehealth: Payer: Self-pay | Admitting: Pharmacist

## 2016-11-24 ENCOUNTER — Other Ambulatory Visit: Payer: Self-pay | Admitting: Hematology and Oncology

## 2016-11-24 DIAGNOSIS — C9110 Chronic lymphocytic leukemia of B-cell type not having achieved remission: Secondary | ICD-10-CM

## 2016-11-24 MED ORDER — IBRUTINIB 420 MG PO TABS: 420.0000 mg | ORAL_TABLET | Freq: Every day | ORAL | 5 refills | Status: DC

## 2016-11-24 NOTE — Telephone Encounter (Signed)
Denyse Amass will help

## 2016-11-24 NOTE — Telephone Encounter (Signed)
Oral Chemotherapy Pharmacist Encounter  Follow-Up Form  Spoke with patient today to follow up regarding patient's oral chemotherapy medication: Imbruvica (ibrutinib) for the treatment of CLL, planned duration until disease progression or unacceptable toxicity  Original Start date of oral chemotherapy: 11/04/14  Pt is doing well today  Pt reports 0 doses of ibrutinib 140mg  capsule, 3 capsules (420mg ) by mouth once daily with or without food, with a full glass of water, missed in the last month.  Patient states that she sets an alarm and takes her ibrutinib at 10am daily with a glass of water.  Pt reports the following side effects:   bruising on arms, legs, chest, abdomen and back, MD is aware. Patient report no issues with bleeding.   She is agreeable to continuing on ibrutinib.   She remains on a daily aspirin directed by her cardiologist. She will alert the office of increase in bruising or uncontrollable bleeding. Noted ED visit 09/26/16 after fall, no findings on CT head WO contrast.  Pertinent labs reviewed: OK for treatment.  Other Issues: new prescription needed, discussed with MD, will e-scribe Rx for 420mg  tablet size (instead of 140mg  capsules)  Patient knows to call the office with questions or concerns. [facility]cology Clinic will continue to follow.  Thank you,  Johny Drilling, PharmD, BCPS, BCOP 11/24/2016 2:19 PM [facility] [phone]

## 2016-11-25 ENCOUNTER — Other Ambulatory Visit: Payer: Self-pay | Admitting: *Deleted

## 2016-12-01 MED FILL — IMBRUVICA 420 MG TAB: 420 | 28 days supply | Qty: 28 | Fill #0

## 2016-12-12 ENCOUNTER — Encounter: Payer: Self-pay | Admitting: Physician Assistant

## 2016-12-18 ENCOUNTER — Ambulatory Visit (HOSPITAL_BASED_OUTPATIENT_CLINIC_OR_DEPARTMENT_OTHER): Payer: Medicare Other | Admitting: Hematology and Oncology

## 2016-12-18 ENCOUNTER — Encounter: Payer: Self-pay | Admitting: Hematology and Oncology

## 2016-12-18 ENCOUNTER — Telehealth: Payer: Self-pay

## 2016-12-18 ENCOUNTER — Telehealth: Payer: Self-pay | Admitting: Hematology and Oncology

## 2016-12-18 ENCOUNTER — Other Ambulatory Visit (HOSPITAL_BASED_OUTPATIENT_CLINIC_OR_DEPARTMENT_OTHER): Payer: Medicare Other

## 2016-12-18 DIAGNOSIS — C9110 Chronic lymphocytic leukemia of B-cell type not having achieved remission: Secondary | ICD-10-CM

## 2016-12-18 DIAGNOSIS — L989 Disorder of the skin and subcutaneous tissue, unspecified: Secondary | ICD-10-CM | POA: Diagnosis not present

## 2016-12-18 DIAGNOSIS — D696 Thrombocytopenia, unspecified: Secondary | ICD-10-CM | POA: Diagnosis not present

## 2016-12-18 LAB — COMPREHENSIVE METABOLIC PANEL
ALT: 16 U/L (ref 0–55)
ANION GAP: 9 meq/L (ref 3–11)
AST: 28 U/L (ref 5–34)
Albumin: 3.9 g/dL (ref 3.5–5.0)
Alkaline Phosphatase: 54 U/L (ref 40–150)
BUN: 14 mg/dL (ref 7.0–26.0)
CALCIUM: 9.9 mg/dL (ref 8.4–10.4)
CHLORIDE: 103 meq/L (ref 98–109)
CO2: 25 meq/L (ref 22–29)
CREATININE: 1 mg/dL (ref 0.6–1.1)
EGFR: 52 mL/min/{1.73_m2} — AB (ref >60)
Glucose: 88 mg/dl (ref 70–140)
Potassium: 4.4 mEq/L (ref 3.5–5.1)
Sodium: 137 mEq/L (ref 136–145)
Total Bilirubin: 1.03 mg/dL (ref 0.20–1.20)
Total Protein: 6.1 g/dL — ABNORMAL LOW (ref 6.4–8.3)

## 2016-12-18 LAB — CBC WITH DIFFERENTIAL/PLATELET
BASO%: 0.7 % (ref 0.0–2.0)
BASOS ABS: 0.1 10*3/uL (ref 0.0–0.1)
EOS ABS: 0.1 10*3/uL (ref 0.0–0.5)
EOS%: 0.8 % (ref 0.0–7.0)
HEMATOCRIT: 40.9 % (ref 34.8–46.6)
HEMOGLOBIN: 13.4 g/dL (ref 11.6–15.9)
LYMPH#: 4.7 10*3/uL — AB (ref 0.9–3.3)
LYMPH%: 54.2 % — ABNORMAL HIGH (ref 14.0–49.7)
MCH: 30.2 pg (ref 25.1–34.0)
MCHC: 32.8 g/dL (ref 31.5–36.0)
MCV: 92.3 fL (ref 79.5–101.0)
MONO#: 1 10*3/uL — ABNORMAL HIGH (ref 0.1–0.9)
MONO%: 11.2 % (ref 0.0–14.0)
NEUT%: 33.1 % — ABNORMAL LOW (ref 38.4–76.8)
NEUTROS ABS: 2.9 10*3/uL (ref 1.5–6.5)
Platelets: 124 10*3/uL — ABNORMAL LOW (ref 145–400)
RBC: 4.43 10*6/uL (ref 3.70–5.45)
RDW: 13.1 % (ref 11.2–14.5)
WBC: 8.7 10*3/uL (ref 3.9–10.3)

## 2016-12-18 NOTE — Telephone Encounter (Signed)
Scheduled patient tomorrow with Dr. Burt Knack. She was grateful for call.

## 2016-12-18 NOTE — Telephone Encounter (Signed)
Gave avs and calendar for June 2019  °

## 2016-12-18 NOTE — Assessment & Plan Note (Signed)
She has multiple skin lesions of unknown etiology This is not secondary to side effects of treatment I am concerned about possible embolic phenomenon/endocarditis I would alert her cardiologist for further evaluation

## 2016-12-18 NOTE — Assessment & Plan Note (Signed)
Likely due to her underlying disease With stability of her platelet count, I recommend she continues 81 mg aspirin for stroke prevention.

## 2016-12-18 NOTE — Assessment & Plan Note (Signed)
The patient has remarkable response to treatment with complete resolution of lymphadenopathy on CT However, she has persistent lymphocytosis detected in her peripheral blood which suggests she has minimal residual disease We discussed the risk and benefit of discontinuation of treatment versus continue ibrutinib for a few more months Ultimately, after extensive discussion, she is in agreement to continue ibrutinib for another 6 months

## 2016-12-18 NOTE — Progress Notes (Signed)
[facility] OFFICE PROGRESS NOTE  Patient Care Team: Kristie Cowman, MD as PCP - General (Family Medicine) Kristie Cowman, MD (Family Medicine) Terance Ice, MD (Cardiology) Sherren Mocha, MD as Consulting Physician (Cardiology)  SUMMARY OF ONCOLOGIC HISTORY:   CLL (chronic lymphocytic leukemia) ([place])   03/21/2011 Initial Diagnosis    CLL (chronic lymphocytic leukemia)      01/14/2014 - 01/31/2014 Hospital Admission    The patient was hospitalized and was found to severe aortic stenosis causing syncopal episode. She received treatment with steroids with minimum success and required platelet transfusion.      09/18/2014 Imaging    CT scan of the chest, abdomen and pelvis show lymphadenopathy but low level burden of disease.      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 There was no stenosis. Mild peri-valvular aortic insufficiency is noted      11/04/2014 -  Chemotherapy    She is started on Ibrutinib      06/17/2016 Imaging    1. No lymphadenopathy noted in the chest, abdomen or pelvis to suggest recurrent disease. 2. Aortic atherosclerosis, in addition to left main and 3 vessel coronary artery disease. Status post median sternotomy for CABG including LIMA to the LAD. 3. 3 mm nonobstructive calculus in the lower pole collecting system of the left kidney. Mild atrophy of the left kidney. 4. Mild colonic diverticulosis, without evidence of acute diverticulitis at this time. 5. Additional incidental findings, as above.       INTERVAL HISTORY: Please see below for problem oriented charting. She returns for further follow-up She tolerated chemotherapy well Denies recent infection She bruises easily She had recent intermittent diarrhea She noticed new skin lesions which she thought was related to side effects of chemotherapy The skin  lesions do not cause any itchiness or discomfort.  REVIEW OF SYSTEMS:   Constitutional: Denies fevers, chills or abnormal weight loss Eyes: Denies blurriness of vision Ears, nose, mouth, throat, and face: Denies mucositis or sore throat Respiratory: Denies cough, dyspnea or wheezes Cardiovascular: Denies palpitation, chest discomfort or lower extremity swelling Lymphatics: Denies new lymphadenopathy  Neurological:Denies numbness, tingling or new weaknesses Behavioral/Psych: Mood is stable, no new changes  All other systems were reviewed with the patient and are negative.  I have reviewed the past medical history, past surgical history, social history and family history with the patient and they are unchanged from previous note.  ALLERGIES:  has No Known Allergies.  MEDICATIONS:  Current Outpatient Medications  Medication Sig Dispense Refill  . acetaminophen (TYLENOL) 325 MG tablet Take 650 mg by mouth every 6 (six) hours as needed for mild pain or headache.     Marland Kitchen amLODipine (NORVASC) 10 MG tablet TAKE ONE TABLET BY MOUTH ONCE DAILY 90 tablet 2  . amoxicillin (AMOXIL) 500 MG tablet Take 4 tablets by mouth one hour prior to dental procedure (Patient not taking: Reported on 09/09/2016) 8 tablet 2  . aspirin 81 MG tablet Take 81 mg by mouth daily.    Marland Kitchen atorvastatin (LIPITOR) 10 MG tablet TAKE 1 TAB ONCE A DAY AT 6 PM 90 tablet 3  . Ibrutinib 420 MG TABS Take 420 mg daily by mouth. Take at approx the same time each day with a full glass of water 30 tablet 5  . lisinopril (PRINIVIL,ZESTRIL) 20 MG tablet TAKE ONE TABLET BY MOUTH  ONCE DAILY 90 tablet 3  . metoprolol succinate (TOPROL-XL) 50 MG 24 hr tablet TAKE ONE TABLET BY MOUTH ONCE DAILY WITH  OR  IMMEDIATELY  FOLLOWING  A  MEAL 90 tablet 3  . Multiple Vitamins-Minerals (HM MULTIVITAMIN ADULT GUMMY PO) Take 1 capsule by mouth daily.     No current facility-administered medications for this visit.     PHYSICAL EXAMINATION: ECOG PERFORMANCE  STATUS: 1 - Symptomatic but completely ambulatory  Vitals:   12/18/16 0935  BP: (!) 173/57  Pulse: (!) 57  Resp: 18  Temp: 97.7 F (36.5 C)  SpO2: 100%   Filed Weights   12/18/16 0935  Weight: 100 lb 6.4 oz (45.5 kg)    GENERAL:alert, no distress and comfortable SKIN: The skin lesions are peculiar but worrisome for possible embolic phenomenon. EYES: normal, Conjunctiva are pink and non-injected, sclera clear OROPHARYNX:no exudate, no erythema and lips, buccal mucosa, and tongue normal  NECK: supple, thyroid normal size, non-tender, without nodularity LYMPH:  no palpable lymphadenopathy in the cervical, axillary or inguinal LUNGS: clear to auscultation and percussion with normal breathing effort HEART: regular rate & rhythm, mild systolic murmur and no murmurs and no lower extremity edema ABDOMEN:abdomen soft, non-tender and normal bowel sounds Musculoskeletal:no cyanosis of digits and no clubbing  NEURO: alert & oriented x 3 with fluent speech, no focal motor/sensory deficits  LABORATORY DATA:  I have reviewed the data as listed    Component Value Date/Time   NA 137 12/18/2016 0916   K 4.4 12/18/2016 0916   CL 96 (L) 12/06/2015 0800   CL 105 05/20/2012 0823   CO2 25 12/18/2016 0916   GLUCOSE 88 12/18/2016 0916   GLUCOSE 70 05/20/2012 0823   BUN 14.0 12/18/2016 0916   CREATININE 1.0 12/18/2016 0916   CALCIUM 9.9 12/18/2016 0916   PROT 6.1 (L) 12/18/2016 0916   ALBUMIN 3.9 12/18/2016 0916   AST 28 12/18/2016 0916   ALT 16 12/18/2016 0916   ALKPHOS 54 12/18/2016 0916   BILITOT 1.03 12/18/2016 0916   GFRNONAA 51 (L) 01/21/2014 0420   GFRAA 60 (L) 01/21/2014 0420    No results found for: SPEP, UPEP  Lab Results  Component Value Date   WBC 8.7 12/18/2016   NEUTROABS 2.9 12/18/2016   HGB 13.4 12/18/2016   HCT 40.9 12/18/2016   MCV 92.3 12/18/2016   PLT 124 (L) 12/18/2016      Chemistry      Component Value Date/Time   NA 137 12/18/2016 0916   K 4.4  12/18/2016 0916   CL 96 (L) 12/06/2015 0800   CL 105 05/20/2012 0823   CO2 25 12/18/2016 0916   BUN 14.0 12/18/2016 0916   CREATININE 1.0 12/18/2016 0916      Component Value Date/Time   CALCIUM 9.9 12/18/2016 0916   ALKPHOS 54 12/18/2016 0916   AST 28 12/18/2016 0916   ALT 16 12/18/2016 0916   BILITOT 1.03 12/18/2016 0916           ASSESSMENT & PLAN:  CLL (chronic lymphocytic leukemia) The patient has remarkable response to treatment with complete resolution of lymphadenopathy on CT However, she has persistent lymphocytosis detected in her peripheral blood which suggests she has minimal residual disease We discussed the risk and benefit of discontinuation of treatment versus continue ibrutinib for a few more months Ultimately, after extensive discussion, she is in agreement to continue ibrutinib for another 6 months  Thrombocytopenia Likely due to her underlying disease With stability of her  platelet count, I recommend she continues 81 mg aspirin for stroke prevention.   Skin lesion She has multiple skin lesions of unknown etiology This is not secondary to side effects of treatment I am concerned about possible embolic phenomenon/endocarditis I would alert her cardiologist for further evaluation   No orders of the defined types were placed in this encounter.  All questions were answered. The patient knows to call the clinic with any problems, questions or concerns. No barriers to learning was detected. I spent 15 minutes counseling the patient face to face. The total time spent in the appointment was 20 minutes and more than 50% was on counseling and review of test results     Heath Lark, MD 12/18/2016 11:46 AM

## 2016-12-18 NOTE — Telephone Encounter (Signed)
-----   Message from Sherren Mocha, MD sent at 12/18/2016  2:16 PM EST ----- Please add on for tomorrow thanks

## 2016-12-19 ENCOUNTER — Encounter: Payer: Self-pay | Admitting: Cardiovascular Disease

## 2016-12-19 ENCOUNTER — Ambulatory Visit: Payer: Medicare Other | Admitting: Cardiovascular Disease

## 2016-12-19 VITALS — BP 174/58 | HR 50 | Ht 60.0 in | Wt 100.1 lb

## 2016-12-19 DIAGNOSIS — I359 Nonrheumatic aortic valve disorder, unspecified: Secondary | ICD-10-CM

## 2016-12-19 DIAGNOSIS — R5383 Other fatigue: Secondary | ICD-10-CM

## 2016-12-19 NOTE — Progress Notes (Signed)
Cardiology Office Note Date:  12/21/2016   ID:  Nicole Mercado, DOB May 23, 1940, MRN [id number]  PCP:  Kristie Cowman, MD  Cardiologist:  Sherren Mocha, MD    Chief Complaint  Patient presents with  . Fatigue     History of Present Illness: Nicole Mercado is a 76 y.o. female who presents for follow-up of aortic valve disease.   The patient has undergone bioprosthetic AVR and CABG in 2016. She has had mild paravalvular regurgitation followed with serial echo studies. She is treated for CLL by Dr Alvy Bimler and at yesterday's evaluation she was noted to have vascular-appearing skin lesions on the palms of her hands raising concern for peripheral stigmata of endocarditis.  She is here with her husband today.  She feels relatively well.  She complains of some generalized fatigue but this is chronic.  She denies shortness of breath, orthopnea, PND, visual symptoms, chest pain, cough, fever, or chills.  She denies joint aches or myalgias.  She noticed these black spots on her hands within the past few weeks.  She denies any pain or itching associated with them.   Past Medical History:  Diagnosis Date  . Aortic stenosis    a. Echocardiogram (01/11/14):  Mild LVH, EF 60-65%, Gr 1 DD, possible bicuspid AV, severe AS (mean 61 mmHg, peak 104 mmHg), mild MVP of post leaflet, mild MR, PASP 33 mmHg.;  b. 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 Arthritis   . Atrial fibrillation ([place])    post op after CABG+AVR >> Amiodarone  . CAD (coronary artery disease)    a. LHC (01/13/14):  dLM 70 extending into oLAD, mLAD 40-50, pD1 80, pD2 70, ostial/prox OM1 70 >> CABG (L-LAD, S-OM1, S-D1, S-D2)    . Carotid artery occlusion    a. s/p L CEA 2013;  b.  Carotid US (1/16):  Bilateral ICA 1-39%  . CLL (chronic lymphocytic leukemia) ([facility])    slow leukemia---Dr  Murinson  . H/O exercise stress test    NSSTT  . Hx of cardiovascular stress test    a. Nuclear stress test That (4/13): Normal perfusion, EF 74%  . Hx of  echocardiogram    Echo (2/16):  Mild LVH, EF 60-65%, no RWMA, Gr 2 DD, AVR ok (mean 9 mmHg), trivial AI, mild MR, mild LAE, PASP 40 mmHg  . Hypertension   . Pancreatitis    h/o  . Thrombocytopenia ([place]) 11/19/2010    Past Surgical History:  Procedure Laterality Date  . ABDOMINAL HYSTERECTOMY    . AORTIC VALVE REPLACEMENT [name] 01/17/2014   Procedure: AORTIC VALVE REPLACEMENT (AVR);  Surgeon: Gaye Pollack, MD;  Location: [facility];  Service: Open Heart Surgery;  Laterality: [name];  . APPENDECTOMY    . CAROTID ENDARTERECTOMY  06/09/11   LEFT  cea  . CESAREAN SECTION     FIVE  . CHOLECYSTECTOMY    . CORONARY ARTERY BYPASS GRAFT [name] 01/17/2014   Procedure: CORONARY ARTERY BYPASS GRAFTING (CABG)TIMES FOUR USING LEFT INTERNAL MAMMARY ARTERY AND RIGHT SAPHENOUS VEIN HARVESTED ENDOSCOPICALLY;  Surgeon: Gaye Pollack, MD;  Location: [facility];  Service: Open Heart Surgery;  Laterality: [name];  . ENDARTERECTOMY  06/09/2011   Procedure: ENDARTERECTOMY CAROTID;  Surgeon: Rosetta Posner, MD;  Location[facility];  Service: Vascular;  Laterality: Left;  left carotid endarterectomy with patch angioplasty  . LEFT AND RIGHT HEART CATHETERIZATION WITH CORONARY ANGIOGRAM [name] 01/13/2014   Procedure: LEFT AND RIGHT HEART CATHETERIZATION WITH CORONARY ANGIOGRAM;  Surgeon: Conception Oms  Hassell Done, MD;  Location: [facility] CATH LAB;  Service: Cardiovascular;  Laterality: [name];  . TEE WITHOUT CARDIOVERSION [name] 01/17/2014   Procedure: TRANSESOPHAGEAL ECHOCARDIOGRAM (TEE);  Surgeon: Gaye Pollack, MD;  Location: [facility];  Service: Open Heart Surgery;  Laterality: [name];  . TONSILLECTOMY      Current Outpatient Medications  Medication Sig Dispense Refill  . acetaminophen (TYLENOL) 325 MG tablet Take 650 mg by mouth every 6 (six) hours as needed for mild pain or headache.     Marland Kitchen amLODipine (NORVASC) 10 MG tablet TAKE ONE TABLET BY MOUTH ONCE DAILY 90 tablet 2  . amoxicillin (AMOXIL) 500 MG tablet Take 4 tablets by mouth one hour prior to dental  procedure 8 tablet 2  . aspirin 81 MG tablet Take 81 mg by mouth daily.    Marland Kitchen atorvastatin (LIPITOR) 10 MG tablet Take 10 mg by mouth daily at 6 PM.    . Ibrutinib 420 MG TABS Take 420 mg daily by mouth. Take at approx the same time each day with a full glass of water 30 tablet 5  . lisinopril (PRINIVIL,ZESTRIL) 20 MG tablet TAKE ONE TABLET BY MOUTH ONCE DAILY 90 tablet 3  . metoprolol succinate (TOPROL-XL) 50 MG 24 hr tablet TAKE ONE TABLET BY MOUTH ONCE DAILY WITH  OR  IMMEDIATELY  FOLLOWING  A  MEAL 90 tablet 3  . Multiple Vitamins-Minerals (HM MULTIVITAMIN ADULT GUMMY PO) Take 1 capsule by mouth daily.     No current facility-administered medications for this visit.     Allergies:   Patient has no known allergies.   Social History:  The patient  reports that  has never smoked. she has never used smokeless tobacco. She reports that she does not drink alcohol or use drugs.   Family History:  The patient's family history includes Heart attack in her mother; Heart disease in her father and mother; Hypertension in her father, mother, sister, and son; Stroke in her paternal grandfather and paternal grandmother.    ROS:  Please see the history of present illness.  Otherwise, review of systems is positive for constipation.  All other systems are reviewed and negative.    PHYSICAL EXAM: VS:  BP (!) 174/58   Pulse (!) 50   Ht 5' (1.524 m)   Wt 100 lb 1.9 oz (45.4 kg)   BMI 19.55 kg/m  , BMI Body mass index is 19.55 kg/m. GEN: Well nourished, well developed, in no acute distress  HEENT: normal  Neck: no JVD, no masses. bilateral carotid bruits Cardiac: RRR with 2/6 SEM at the RUSB and 2/6 diastolic decrescendo murmur at the RUSB, unchanged from previous exams               Respiratory:  clear to auscultation bilaterally, normal work of breathing GI: soft, nontender, nondistended, + BS MS: no deformity or atrophy  Ext: no pretibial edema, pedal pulses 2+= bilaterally Skin: warm and dry,  there are 2 well circumscribed darkly pigmented lesion on the palm of the right hand and another on the palm of the left hand at the base of the 4th finger.  Neuro:  Strength and sensation are intact Psych: euthymic mood, full affect  EKG:  EKG is not ordered today.  Recent Labs: 12/18/2016: ALT 16; BUN 14.0; Creatinine 1.0; HGB 13.4; Platelets 124; Potassium 4.4; Sodium 137   Lipid Panel     Component Value Date/Time   CHOL 140 05/01/2015 0920   TRIG 115 05/01/2015 0920  HDL 69 05/01/2015 0920   CHOLHDL 2.0 05/01/2015 0920   VLDL 23 05/01/2015 0920   LDLCALC 48 05/01/2015 0920      Wt Readings from Last 3 Encounters:  12/19/16 100 lb 1.9 oz (45.4 kg)  12/18/16 100 lb 6.4 oz (45.5 kg)  10/02/16 100 lb (45.4 kg)     ASSESSMENT AND PLAN: 1.  Aortic valve disease: s/p bioprosthetic aortic valve replacement with mild paravalvular regurgitation 2.  Coronary artery disease status post CABG, without angina 3.  Hypertension with whitecoat component 4.  Peripheral skin lesions possible vascular lesions raising the question of stigmata of prosthetic valve endocarditis 5.  Hyperlipidemia 6.  CLL  The patient's cardiac exam is stable.  Her recent labs are reviewed.  She does not appear to be symptomatic at this point.  Will update her echocardiogram to make sure there are no gross abnormalities of her aortic valve bioprosthesis other than known mild paravalvular regurgitation which has been documented in the past.  We will also draw 2 sets of blood cultures but have a relatively low index of suspicion that she has active endocarditis.  Will follow up with the patient after her studies are completed.  She will remain on her current program for treatment of coronary artery disease and hypertension.  Current medicines are reviewed with the patient today.  The patient does not have concerns regarding medicines.  Labs/ tests ordered today include:   Orders Placed This Encounter  Procedures    . Culture, blood (single)  . Culture, blood (single)  . ECHOCARDIOGRAM COMPLETE    Disposition:   FU one year unless abnormalities seen on echo/blood cultures  Signed, Sherren Mocha, MD  12/21/2016 7:10 PM    [address], [place], [place]  [address] Phone: [phone]; Fax: [phone]

## 2016-12-19 NOTE — Patient Instructions (Addendum)
Medication Instructions:  Your physician recommends that you continue on your current medications as directed. Please refer to the Current Medication list given to you today.  Labwork: Your physician recommends that you have lab work today: Blood cultures times 2   Testing/Procedures: Your physician has requested that you have an echocardiogram. Echocardiography is a painless test that uses sound waves to create images of your heart. It provides your doctor with information about the size and shape of your heart and how well your heart's chambers and valves are working. This procedure takes approximately one hour. There are no restrictions for this procedure.  Follow-Up: Your physician wants you to follow-up in: 1 YEAR with Dr Burt Knack. You will receive a reminder letter in the mail two months in advance. If you don't receive a letter, please call our office to schedule the follow-up appointment.  I will cancel the appointment with Richardson Dopp PA-C.   Any Other Special Instructions Will Be Listed Below (If Applicable).     If you need a refill on your cardiac medications before your next appointment, please call your pharmacy.

## 2016-12-23 ENCOUNTER — Telehealth: Payer: Self-pay | Admitting: Cardiovascular Disease

## 2016-12-23 NOTE — Telephone Encounter (Signed)
-----   Message from Sherren Mocha, MD sent at 12/22/2016  1:39 PM EST ----- BCx's negative to date

## 2016-12-23 NOTE — Telephone Encounter (Signed)
Informed patient of results and verbal understanding expressed.  

## 2016-12-23 NOTE — Telephone Encounter (Signed)
Follow Up ° ° ° °Returning call from earlier. Please call. °

## 2016-12-25 LAB — CULTURE, BLOOD (SINGLE)

## 2016-12-26 ENCOUNTER — Other Ambulatory Visit: Payer: Self-pay

## 2016-12-26 ENCOUNTER — Ambulatory Visit (HOSPITAL_COMMUNITY): Payer: Medicare Other | Attending: Internal Medicine

## 2016-12-26 DIAGNOSIS — I2510 Atherosclerotic heart disease of native coronary artery without angina pectoris: Secondary | ICD-10-CM | POA: Diagnosis not present

## 2016-12-26 DIAGNOSIS — I359 Nonrheumatic aortic valve disorder, unspecified: Secondary | ICD-10-CM

## 2016-12-26 DIAGNOSIS — I10 Essential (primary) hypertension: Secondary | ICD-10-CM | POA: Insufficient documentation

## 2016-12-26 DIAGNOSIS — I4891 Unspecified atrial fibrillation: Secondary | ICD-10-CM | POA: Diagnosis not present

## 2016-12-26 DIAGNOSIS — I080 Rheumatic disorders of both mitral and aortic valves: Secondary | ICD-10-CM | POA: Diagnosis not present

## 2016-12-26 DIAGNOSIS — R5383 Other fatigue: Secondary | ICD-10-CM

## 2016-12-31 ENCOUNTER — Telehealth: Payer: Self-pay | Admitting: Cardiovascular Disease

## 2016-12-31 NOTE — Telephone Encounter (Signed)
-----   Message from Sherren Mocha, MD sent at 12/26/2016 10:53 PM EST ----- Stable echo findings with normal LV function, normal aortic valve gradients, and mild paravalvular regurgitation unchanged from last study. No signs of endocarditis. May report to patient. tx

## 2016-12-31 NOTE — Telephone Encounter (Signed)
New message     Patient is returning call from 5 minutes ago?

## 2016-12-31 NOTE — Telephone Encounter (Signed)
Informed patient of results and verbal understanding expressed.  

## 2017-01-02 ENCOUNTER — Ambulatory Visit: Payer: Medicare Other | Admitting: Physician Assistant

## 2017-01-09 MED FILL — IMBRUVICA 420 MG TAB: 420 | 28 days supply | Qty: 28 | Fill #1

## 2017-01-23 ENCOUNTER — Ambulatory Visit (HOSPITAL_COMMUNITY): Payer: Medicare Other | Admitting: Nurse Practitioner

## 2017-02-02 MED FILL — IMBRUVICA 420 MG TAB: 420 | 28 days supply | Qty: 28 | Fill #2

## 2017-02-26 MED FILL — IMBRUVICA 420 MG TAB: 420 | 28 days supply | Qty: 28 | Fill #3

## 2017-03-31 ENCOUNTER — Other Ambulatory Visit: Payer: Self-pay | Admitting: Cardiovascular Disease

## 2017-03-31 DIAGNOSIS — Z952 Presence of prosthetic heart valve: Secondary | ICD-10-CM

## 2017-03-31 MED ORDER — AMOXICILLIN 500 MG PO TABS: ORAL_TABLET | ORAL | 2 refills | Status: DC

## 2017-03-31 NOTE — Telephone Encounter (Signed)
Pt calling requesting a refill on amoxicillin, for a upcoming dental appointment. Please address

## 2017-04-07 MED FILL — IMBRUVICA 420 MG TAB: 420 | 28 days supply | Qty: 28 | Fill #4

## 2017-04-21 ENCOUNTER — Telehealth: Payer: Self-pay

## 2017-04-21 NOTE — Telephone Encounter (Signed)
She called to say she received notification from the [name] that the assist with Ibrutinib is ending 5/31. The phone number at the [facility] is [phone]. Will forward message to [place] in oral chemo.

## 2017-04-24 ENCOUNTER — Telehealth: Payer: Self-pay | Admitting: Pharmacy Technician

## 2017-04-24 NOTE — Telephone Encounter (Signed)
Oral Oncology Patient Advocate Encounter  Was successful in securing patient a $ 10,000 grant from [facility] [employer] to provide copayment coverage for her Imbruvica.  This will keep the out of pocket expense at $0.    I have spoken with the patient.    The billing information is as follows and has been shared with [place].   Member ID: [id number] Group ID: [id number] RxBin: [id number] Dates of Eligibility: 04/24/2017 through 04/25/2018  Fabio Asa. Melynda Keller, [facility] Patient [place] [phone] 04/24/2017 2:32 PM

## 2017-05-04 MED FILL — IMBRUVICA 420 MG TAB: 420 | 28 days supply | Qty: 28 | Fill #5

## 2017-06-03 ENCOUNTER — Other Ambulatory Visit: Payer: Self-pay | Admitting: Hematology and Oncology

## 2017-06-03 DIAGNOSIS — C9110 Chronic lymphocytic leukemia of B-cell type not having achieved remission: Secondary | ICD-10-CM

## 2017-06-03 MED FILL — IMBRUVICA 420 MG TAB: 420 | 28 days supply | Qty: 28 | Fill #0

## 2017-06-15 ENCOUNTER — Other Ambulatory Visit: Payer: Self-pay | Admitting: Hematology and Oncology

## 2017-06-15 DIAGNOSIS — C9110 Chronic lymphocytic leukemia of B-cell type not having achieved remission: Secondary | ICD-10-CM

## 2017-06-18 ENCOUNTER — Encounter: Payer: Self-pay | Admitting: Hematology and Oncology

## 2017-06-18 ENCOUNTER — Inpatient Hospital Stay: Payer: Medicare Other | Attending: Hematology and Oncology | Admitting: Hematology and Oncology

## 2017-06-18 ENCOUNTER — Telehealth: Payer: Self-pay | Admitting: Hematology and Oncology

## 2017-06-18 ENCOUNTER — Inpatient Hospital Stay: Payer: Medicare Other

## 2017-06-18 DIAGNOSIS — C9190 Lymphoid leukemia, unspecified not having achieved remission: Secondary | ICD-10-CM | POA: Diagnosis not present

## 2017-06-18 DIAGNOSIS — C9110 Chronic lymphocytic leukemia of B-cell type not having achieved remission: Secondary | ICD-10-CM | POA: Insufficient documentation

## 2017-06-18 DIAGNOSIS — Z9221 Personal history of antineoplastic chemotherapy: Secondary | ICD-10-CM | POA: Diagnosis not present

## 2017-06-18 DIAGNOSIS — Z7982 Long term (current) use of aspirin: Secondary | ICD-10-CM | POA: Insufficient documentation

## 2017-06-18 DIAGNOSIS — I10 Essential (primary) hypertension: Secondary | ICD-10-CM

## 2017-06-18 DIAGNOSIS — R233 Spontaneous ecchymoses: Secondary | ICD-10-CM | POA: Diagnosis not present

## 2017-06-18 DIAGNOSIS — Z79899 Other long term (current) drug therapy: Secondary | ICD-10-CM | POA: Insufficient documentation

## 2017-06-18 DIAGNOSIS — I2510 Atherosclerotic heart disease of native coronary artery without angina pectoris: Secondary | ICD-10-CM | POA: Insufficient documentation

## 2017-06-18 DIAGNOSIS — R238 Other skin changes: Secondary | ICD-10-CM

## 2017-06-18 LAB — CBC WITH DIFFERENTIAL/PLATELET
Basophils Absolute: 0.1 10*3/uL (ref 0.0–0.1)
Basophils Relative: 1 %
EOS PCT: 0 %
Eosinophils Absolute: 0 10*3/uL (ref 0.0–0.5)
HCT: 42.5 % (ref 34.8–46.6)
Hemoglobin: 14.3 g/dL (ref 11.6–15.9)
LYMPHS ABS: 3.9 10*3/uL — AB (ref 0.9–3.3)
LYMPHS PCT: 53 %
MCH: 31.5 pg (ref 25.1–34.0)
MCHC: 33.6 g/dL (ref 31.5–36.0)
MCV: 93.7 fL (ref 79.5–101.0)
MONO ABS: 0.9 10*3/uL (ref 0.1–0.9)
MONOS PCT: 12 %
Neutro Abs: 2.5 10*3/uL (ref 1.5–6.5)
Neutrophils Relative %: 34 %
PLATELETS: 143 10*3/uL — AB (ref 145–400)
RBC: 4.54 MIL/uL (ref 3.70–5.45)
RDW: 13.2 % (ref 11.2–14.5)
WBC: 7.4 10*3/uL (ref 3.9–10.3)

## 2017-06-18 LAB — COMPREHENSIVE METABOLIC PANEL
ALT: 16 U/L (ref 0–55)
AST: 29 U/L (ref 5–34)
Albumin: 4.4 g/dL (ref 3.5–5.0)
Alkaline Phosphatase: 55 U/L (ref 40–150)
Anion gap: 9 (ref 3–11)
BUN: 16 mg/dL (ref 7–26)
CHLORIDE: 99 mmol/L (ref 98–109)
CO2: 27 mmol/L (ref 22–29)
CREATININE: 0.97 mg/dL (ref 0.60–1.10)
Calcium: 10.4 mg/dL (ref 8.4–10.4)
GFR, EST NON AFRICAN AMERICAN: 55 mL/min — AB (ref >60)
Glucose, Bld: 92 mg/dL (ref 70–140)
POTASSIUM: 4.5 mmol/L (ref 3.5–5.1)
Sodium: 135 mmol/L — ABNORMAL LOW (ref 136–145)
TOTAL PROTEIN: 6.7 g/dL (ref 6.4–8.3)
Total Bilirubin: 0.8 mg/dL (ref 0.2–1.2)

## 2017-06-18 LAB — LACTATE DEHYDROGENASE: LDH: 251 U/L — AB (ref 125–245)

## 2017-06-18 NOTE — Progress Notes (Signed)
[facility] OFF[facility] PROGRESS NOTE  Patient Care Team: Kristie Cowman, MD as PCP - General (Family Medicine) Kristie Cowman, MD (Family Medicine) Terance Ice, MD (Cardiology) Sherren Mocha, MD as Consulting Physician (Cardiology)  ASSESSMENT & PLAN:  CLL (chronic lymphocytic leukemia) She has achieved near complete hematological response By current criteria, lymphocytosis of 3.9 is not considered significant The patient has expressed desire to stop treatment She has been on current treatment for over 3 years and last imaging study showed no evidence of disease We discussed the risk and benefit of discontinuation of treatment and she would like to try She will stop treatment at the end of the month when her current prescription runs out I plan to see her back again in a few months for further follow-up  Essential hypertension She has poorly controlled hypertension She is not symptomatic We discussed the importance of close blood Pressure monitoring and follow-up with her cardiologist for medication adjustment if needed  Easy bruising She has chronic easy bruising due to aspirin therapy and chemotherapy Hopefully, with discontinuation of ibrutinib, her bruising is less   No orders of the defined types were placed in this encounter.   INTERVAL HISTORY: Please see below for problem oriented charting. She returns for further follow-up She complained of easy bruising Denies new lymphadenopathy No recent infection She denies dizziness, headache or chest pain  SUMMARY OF ONCOLOGIC HISTORY:   CLL (chronic lymphocytic leukemia) ([place])   03/21/2011 Initial Diagnosis    CLL (chronic lymphocytic leukemia)      01/14/2014 - 01/31/2014 Hospital Admission    The patient was hospitalized and was found to severe aortic stenosis causing syncopal episode. She received treatment with steroids with minimum success and required platelet transfusion.      09/18/2014 Imaging     CT scan of the chest, abdomen and pelvis show lymphadenopathy but low level burden of disease.      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 There was no stenosis. Mild peri-valvular aortic insufficiency is noted      11/04/2014 -  Chemotherapy    She is started on Ibrutinib      06/17/2016 Imaging    1. No lymphadenopathy noted in the chest, abdomen or pelvis to suggest recurrent disease. 2. Aortic atherosclerosis, in addition to left main and 3 vessel coronary artery disease. Status post median sternotomy for CABG including LIMA to the LAD. 3. 3 mm nonobstructive calculus in the lower pole collecting system of the left kidney. Mild atrophy of the left kidney. 4. Mild colonic diverticulosis, without evidence of acute diverticulitis at this time. 5. Additional incidental findings, as above.       REVIEW OF SYSTEMS:   Constitutional: Denies fevers, chills or abnormal weight loss Eyes: Denies blurriness of vision Ears, nose, mouth, throat, and face: Denies mucositis or sore throat Respiratory: Denies cough, dyspnea or wheezes Cardiovascular: Denies palpitation, chest discomfort or lower extremity swelling Gastrointestinal:  Denies nausea, heartburn or change in bowel habits Skin: Denies abnormal skin rashes Lymphatics: Denies new lymphadenopathy  Neurological:Denies numbness, tingling or new weaknesses Behavioral/Psych: Mood is stable, no new changes  All other systems were reviewed with the patient and are negative.  I have reviewed the past medical history, past surgical history, social history and family history with the patient and they are unchanged from previous note.  ALLERGIES:  has No Known Allergies.  MEDICATIONS:  Current Outpatient Medications  Medication Sig Dispense Refill  . acetaminophen (TYLENOL) 325 MG tablet Take 650 mg by mouth every 6  (six) hours as needed for mild pain or headache.     Marland Kitchen amLODipine (NORVASC) 10 MG tablet TAKE ONE TABLET BY MOUTH ONCE DAILY 90 tablet 2  . amoxicillin (AMOXIL) 500 MG tablet Take 4 tablets by mouth one hour prior to dental procedure 8 tablet 2  . aspirin 81 MG tablet Take 81 mg by mouth daily.    Marland Kitchen atorvastatin (LIPITOR) 10 MG tablet Take 10 mg by mouth daily at 6 PM.    . IMBRUVICA 420 MG TABS TAKE 420 MG (1 TABLET) BY MOUTH DAILY. TAKE AT APPROX THE SAME TIME EACH DAY WITH A FULL GLASS OF WATER 28 tablet 5  . lisinopril (PRINIVIL,ZESTRIL) 20 MG tablet TAKE ONE TABLET BY MOUTH ONCE DAILY 90 tablet 3  . metoprolol succinate (TOPROL-XL) 50 MG 24 hr tablet TAKE ONE TABLET BY MOUTH ONCE DAILY WITH  OR  IMMEDIATELY  FOLLOWING  A  MEAL 90 tablet 3  . Multiple Vitamins-Minerals (HM MULTIVITAMIN ADULT GUMMY PO) Take 1 capsule by mouth daily.     No current facility-administered medications for this visit.     PHYSICAL EXAMINATION: ECOG PERFORMANCE STATUS: 1 - Symptomatic but completely ambulatory  Vitals:   06/18/17 0834  BP: (!) 234/58  Pulse: (!) 58  Resp: 18  Temp: 97.8 F (36.6 C)  SpO2: 100%   Filed Weights   06/18/17 0834  Weight: 97 lb 1.6 oz (44 kg)    GENERAL:alert, no distress and comfortable SKIN: skin color, texture, turgor are normal, no rashes or significant lesions.  Noted skin bruising EYES: normal, Conjunctiva are pink and non-injected, sclera clear OROPHARYNX:no exudate, no erythema and lips, buccal mucosa, and tongue normal  NECK: supple, thyroid normal size, non-tender, without nodularity LYMPH:  no palpable lymphadenopathy in the cervical, axillary or inguinal LUNGS: clear to auscultation and percussion with normal breathing effort HEART: regular rate & rhythm and no murmurs and no lower extremity edema ABDOMEN:abdomen soft, non-tender and normal bowel sounds Musculoskeletal:no cyanosis of digits and no clubbing  NEURO: alert & oriented x 3 with fluent speech,  no focal motor/sensory deficits  LABORATORY DATA:  I have reviewed the data as listed    Component Value Date/Time   NA 135 (L) 06/18/2017 0811   NA 137 12/18/2016 0916   K 4.5 06/18/2017 0811   K 4.4 12/18/2016 0916   CL 99 06/18/2017 0811   CL 105 05/20/2012 0823   CO2 27 06/18/2017 0811   CO2 25 12/18/2016 0916   GLUCOSE 92 06/18/2017 0811   GLUCOSE 88 12/18/2016 0916   GLUCOSE 70 05/20/2012 0823   BUN 16 06/18/2017 0811   BUN 14.0 12/18/2016 0916   CREATININE 0.97 06/18/2017 0811   CREATININE 1.0 12/18/2016 0916   CALCIUM 10.4 06/18/2017 0811   CALCIUM 9.9 12/18/2016 0916   PROT 6.7 06/18/2017 0811   PROT 6.1 (L) 12/18/2016 0916   ALBUMIN 4.4 06/18/2017 0811   ALBUMIN 3.9 12/18/2016 0916   AST 29 06/18/2017 0811   AST 28 12/18/2016 0916   ALT 16 06/18/2017 0811   ALT 16 12/18/2016 0916   ALKPHOS 55 06/18/2017 0811   ALKPHOS 54 12/18/2016 0916   BILITOT 0.8 06/18/2017 0811   BILITOT 1.03 12/18/2016 0916   GFRNONAA 55 (L) 06/18/2017 0811   GFRAA >60 06/18/2017 0811    No  results found for: SPEP, UPEP  Lab Results  Component Value Date   WBC 7.4 06/18/2017   NEUTROABS 2.5 06/18/2017   HGB 14.3 06/18/2017   HCT 42.5 06/18/2017   MCV 93.7 06/18/2017   PLT 143 (L) 06/18/2017      Chemistry      Component Value Date/Time   NA 135 (L) 06/18/2017 0811   NA 137 12/18/2016 0916   K 4.5 06/18/2017 0811   K 4.4 12/18/2016 0916   CL 99 06/18/2017 0811   CL 105 05/20/2012 0823   CO2 27 06/18/2017 0811   CO2 25 12/18/2016 0916   BUN 16 06/18/2017 0811   BUN 14.0 12/18/2016 0916   CREATININE 0.97 06/18/2017 0811   CREATININE 1.0 12/18/2016 0916      Component Value Date/Time   CALCIUM 10.4 06/18/2017 0811   CALCIUM 9.9 12/18/2016 0916   ALKPHOS 55 06/18/2017 0811   ALKPHOS 54 12/18/2016 0916   AST 29 06/18/2017 0811   AST 28 12/18/2016 0916   ALT 16 06/18/2017 0811   ALT 16 12/18/2016 0916   BILITOT 0.8 06/18/2017 0811   BILITOT 1.03 12/18/2016 0916       All questions were answered. The patient knows to call the clinic with any problems, questions or concerns. No barriers to learning was detected.  I spent 15 minutes counseling the patient face to face. The total time spent in the appointment was 20 minutes and more than 50% was on counseling and review of test results  Heath Lark, MD 06/18/2017 12:19 PM

## 2017-06-18 NOTE — Telephone Encounter (Signed)
Gave patient avs and calendar of upcoming September appointments.  °

## 2017-06-18 NOTE — Assessment & Plan Note (Signed)
She has chronic easy bruising due to aspirin therapy and chemotherapy Hopefully, with discontinuation of ibrutinib, her bruising is less

## 2017-06-18 NOTE — Assessment & Plan Note (Signed)
She has poorly controlled hypertension She is not symptomatic We discussed the importance of close blood Pressure monitoring and follow-up with her cardiologist for medication adjustment if needed 

## 2017-06-18 NOTE — Assessment & Plan Note (Signed)
She has achieved near complete hematological response By current criteria, lymphocytosis of 3.9 is not considered significant The patient has expressed desire to stop treatment She has been on current treatment for over 3 years and last imaging study showed no evidence of disease We discussed the risk and benefit of discontinuation of treatment and she would like to try She will stop treatment at the end of the month when her current prescription runs out I plan to see her back again in a few months for further follow-up

## 2017-06-22 ENCOUNTER — Telehealth: Payer: Self-pay | Admitting: Cardiovascular Disease

## 2017-06-22 DIAGNOSIS — I10 Essential (primary) hypertension: Secondary | ICD-10-CM

## 2017-06-22 MED ORDER — CHLORTHALIDONE 25 MG PO TABS: 12.5000 mg | ORAL_TABLET | Freq: Every day | ORAL | 11 refills | Status: DC

## 2017-06-22 NOTE — Telephone Encounter (Signed)
Recommend add chlorthalidone 12.5 mg daily, continue other antihypertensive medicines without change, and follow-up metabolic panel in 2 weeks.

## 2017-06-22 NOTE — Telephone Encounter (Signed)
Instructed patient to START CHLORTHALIDONE 12.5 mg daily. She will come 7/1 for blood work. She was grateful for call and agrees with treatment plan.

## 2017-06-22 NOTE — Telephone Encounter (Signed)
New Message:       Pt c/o BP issue: STAT if pt c/o blurred vision, one-sided weakness or slurred speech  1. What are your last 5 BP readings? 179/60 188/60  2. Are you having any other symptoms (ex. Dizziness, headache, blurred vision, passed out)? No  3. What is your BP issue? Pt is calling and states she will be going out of town on tomorrow but is having issues with her BP going up

## 2017-06-22 NOTE — Telephone Encounter (Signed)
The patient states she was told by her oncologist to call Cardiology because her BP was elevated. At home, her blood pressure is running consistently between 170-190/60s. All of her readings have been taken PRIOR to taking medications in the morning.  She checked her BP after her meds were taken today, and her pressure was 163/60.  She is completely asymptomatic and is taking medications as directed. She requests medications changes tonight since she will be gone for 9 days to [place] for her granddaughter's graduation party.

## 2017-07-06 ENCOUNTER — Other Ambulatory Visit: Payer: Medicare Other | Admitting: *Deleted

## 2017-07-06 DIAGNOSIS — I10 Essential (primary) hypertension: Secondary | ICD-10-CM

## 2017-07-06 LAB — BASIC METABOLIC PANEL
BUN / CREAT RATIO: 17 (ref 12–28)
BUN: 21 mg/dL (ref 8–27)
CO2: 25 mmol/L (ref 20–29)
Calcium: 10.8 mg/dL — ABNORMAL HIGH (ref 8.7–10.3)
Chloride: 88 mmol/L — ABNORMAL LOW (ref 96–106)
Creatinine, Ser: 1.24 mg/dL — ABNORMAL HIGH (ref 0.57–1.00)
GFR calc Af Amer: 49 mL/min/{1.73_m2} — ABNORMAL LOW (ref >59)
GFR calc non Af Amer: 42 mL/min/{1.73_m2} — ABNORMAL LOW (ref >59)
GLUCOSE: 77 mg/dL (ref 65–99)
Potassium: 3.9 mmol/L (ref 3.5–5.2)
Sodium: 126 mmol/L — ABNORMAL LOW (ref 134–144)

## 2017-07-08 ENCOUNTER — Telehealth: Payer: Self-pay

## 2017-07-08 DIAGNOSIS — I10 Essential (primary) hypertension: Secondary | ICD-10-CM

## 2017-07-08 NOTE — Telephone Encounter (Signed)
Informed patient of results and verbal understanding expressed.  Instructed patient to STOP CHLORTHALIDONE.  She will continue to check BP and call if elevated. She says her pressures are "the best they've ever been" (around 140/80).  BMET scheduled 7/17.  Patient agrees with treatment plan.

## 2017-07-08 NOTE — Telephone Encounter (Signed)
-----   Message from Sherren Mocha, MD sent at 07/07/2017  5:35 PM EDT ----- Significant lab abnormalities are new, suspect related to thiazide diuretic therapy.  Recommend stop chlorthalidone and repeat labs again in 2 weeks.

## 2017-07-21 ENCOUNTER — Other Ambulatory Visit: Payer: Self-pay | Admitting: Cardiovascular Disease

## 2017-07-21 DIAGNOSIS — I10 Essential (primary) hypertension: Secondary | ICD-10-CM

## 2017-07-22 ENCOUNTER — Telehealth: Payer: Self-pay | Admitting: Cardiovascular Disease

## 2017-07-22 ENCOUNTER — Other Ambulatory Visit: Payer: Medicare Other | Admitting: *Deleted

## 2017-07-22 DIAGNOSIS — I10 Essential (primary) hypertension: Secondary | ICD-10-CM

## 2017-07-22 LAB — BASIC METABOLIC PANEL
BUN/Creatinine Ratio: 15 (ref 12–28)
BUN: 15 mg/dL (ref 8–27)
CO2: 23 mmol/L (ref 20–29)
Calcium: 10.2 mg/dL (ref 8.7–10.3)
Chloride: 99 mmol/L (ref 96–106)
Creatinine, Ser: 1.03 mg/dL — ABNORMAL HIGH (ref 0.57–1.00)
GFR calc Af Amer: 61 mL/min/{1.73_m2} (ref >59)
GFR calc non Af Amer: 53 mL/min/{1.73_m2} — ABNORMAL LOW (ref >59)
GLUCOSE: 96 mg/dL (ref 65–99)
Potassium: 4.5 mmol/L (ref 3.5–5.2)
SODIUM: 136 mmol/L (ref 134–144)

## 2017-07-22 NOTE — Telephone Encounter (Signed)
Walk in pt Form-Bp Readings Dropped off. Placed in [facility] doc box.

## 2017-07-29 ENCOUNTER — Telehealth: Payer: Self-pay

## 2017-07-29 DIAGNOSIS — I10 Essential (primary) hypertension: Secondary | ICD-10-CM

## 2017-07-29 MED ORDER — LISINOPRIL 40 MG PO TABS: 40.0000 mg | ORAL_TABLET | Freq: Every day | ORAL | 3 refills | Status: DC

## 2017-07-29 NOTE — Telephone Encounter (Signed)
Walk-in form received from patient with BP readings. BP ranged from 124-167/49-64 Per Dr. Burt Knack, instructed patient to INCREASE LISINOPRIL to 40 mg daily. She will continue to monitor BP and call if abnormal. She was grateful for call and agrees with treatment plan.

## 2017-08-31 ENCOUNTER — Other Ambulatory Visit: Payer: Self-pay | Admitting: Cardiovascular Disease

## 2017-09-17 ENCOUNTER — Inpatient Hospital Stay: Payer: Medicare Other | Admitting: Hematology and Oncology

## 2017-09-17 ENCOUNTER — Encounter: Payer: Self-pay | Admitting: Hematology and Oncology

## 2017-09-17 ENCOUNTER — Inpatient Hospital Stay: Payer: Medicare Other | Attending: Hematology and Oncology

## 2017-09-17 ENCOUNTER — Other Ambulatory Visit: Payer: Self-pay | Admitting: Hematology and Oncology

## 2017-09-17 DIAGNOSIS — Z7982 Long term (current) use of aspirin: Secondary | ICD-10-CM | POA: Insufficient documentation

## 2017-09-17 DIAGNOSIS — C9110 Chronic lymphocytic leukemia of B-cell type not having achieved remission: Secondary | ICD-10-CM

## 2017-09-17 DIAGNOSIS — I10 Essential (primary) hypertension: Secondary | ICD-10-CM | POA: Insufficient documentation

## 2017-09-23 ENCOUNTER — Telehealth: Payer: Self-pay | Admitting: Hematology and Oncology

## 2017-09-23 NOTE — Telephone Encounter (Signed)
Patient called to reschedule her appointment °

## 2017-09-28 ENCOUNTER — Encounter (HOSPITAL_COMMUNITY): Payer: Medicare Other

## 2017-09-28 ENCOUNTER — Ambulatory Visit: Payer: Medicare Other | Admitting: Family

## 2017-10-05 ENCOUNTER — Encounter: Payer: Self-pay | Admitting: Hematology and Oncology

## 2017-10-05 ENCOUNTER — Inpatient Hospital Stay: Payer: Medicare Other

## 2017-10-05 ENCOUNTER — Telehealth: Payer: Self-pay | Admitting: Hematology and Oncology

## 2017-10-05 ENCOUNTER — Inpatient Hospital Stay (HOSPITAL_BASED_OUTPATIENT_CLINIC_OR_DEPARTMENT_OTHER): Payer: Medicare Other | Admitting: Hematology and Oncology

## 2017-10-05 DIAGNOSIS — D696 Thrombocytopenia, unspecified: Secondary | ICD-10-CM

## 2017-10-05 DIAGNOSIS — C9110 Chronic lymphocytic leukemia of B-cell type not having achieved remission: Secondary | ICD-10-CM

## 2017-10-05 DIAGNOSIS — I10 Essential (primary) hypertension: Secondary | ICD-10-CM

## 2017-10-05 DIAGNOSIS — Z7982 Long term (current) use of aspirin: Secondary | ICD-10-CM | POA: Diagnosis not present

## 2017-10-05 LAB — COMPREHENSIVE METABOLIC PANEL
ALT: 18 U/L (ref 0–44)
ANION GAP: 7 (ref 5–15)
AST: 27 U/L (ref 15–41)
Albumin: 4.1 g/dL (ref 3.5–5.0)
Alkaline Phosphatase: 61 U/L (ref 38–126)
BILIRUBIN TOTAL: 0.9 mg/dL (ref 0.3–1.2)
BUN: 20 mg/dL (ref 8–23)
CHLORIDE: 102 mmol/L (ref 98–111)
CO2: 28 mmol/L (ref 22–32)
Calcium: 10.5 mg/dL — ABNORMAL HIGH (ref 8.9–10.3)
Creatinine, Ser: 1.07 mg/dL — ABNORMAL HIGH (ref 0.44–1.00)
GFR, EST AFRICAN AMERICAN: 57 mL/min — AB (ref >60)
GFR, EST NON AFRICAN AMERICAN: 49 mL/min — AB (ref >60)
Glucose, Bld: 93 mg/dL (ref 70–99)
POTASSIUM: 4.2 mmol/L (ref 3.5–5.1)
Sodium: 137 mmol/L (ref 135–145)
TOTAL PROTEIN: 6.2 g/dL — AB (ref 6.5–8.1)

## 2017-10-05 LAB — CBC WITH DIFFERENTIAL/PLATELET
BASOS ABS: 0 10*3/uL (ref 0.0–0.1)
Basophils Relative: 1 %
EOS PCT: 1 %
Eosinophils Absolute: 0.1 10*3/uL (ref 0.0–0.5)
HCT: 38.5 % (ref 34.8–46.6)
HEMOGLOBIN: 13.1 g/dL (ref 11.6–15.9)
LYMPHS ABS: 3 10*3/uL (ref 0.9–3.3)
LYMPHS PCT: 51 %
MCH: 31.3 pg (ref 25.1–34.0)
MCHC: 33.9 g/dL (ref 31.5–36.0)
MCV: 92.3 fL (ref 79.5–101.0)
Monocytes Absolute: 0.8 10*3/uL (ref 0.1–0.9)
Monocytes Relative: 13 %
NEUTROS ABS: 1.9 10*3/uL (ref 1.5–6.5)
NEUTROS PCT: 34 %
PLATELETS: 110 10*3/uL — AB (ref 145–400)
RBC: 4.17 MIL/uL (ref 3.70–5.45)
RDW: 12.7 % (ref 11.2–14.5)
WBC: 5.8 10*3/uL (ref 3.9–10.3)

## 2017-10-05 NOTE — Assessment & Plan Note (Signed)
She feels well.  We have discontinued ibrutinib recently and she remained asymptomatic. I suspect a low platelet count may be unrelated to CLL She has no signs of lymphocytosis I plan to see her back in a year for further follow-up We discussed signs and symptoms to watch out for cancer recurrence

## 2017-10-05 NOTE — Telephone Encounter (Signed)
Gave patient avs and calendar.   °

## 2017-10-05 NOTE — Assessment & Plan Note (Signed)
She has mild hypercalcemia This is likely due to her fasting state and slight dehydration We will observe only and recheck blood work in the next visit

## 2017-10-05 NOTE — Progress Notes (Signed)
[facility] OFFICE PROGRESS NOTE  Patient Care Team: Kristie Cowman, MD as PCP - General (Family Medicine) Kristie Cowman, MD (Family Medicine) Terance Ice, MD (Cardiology) Sherren Mocha, MD as Consulting Physician (Cardiology)  ASSESSMENT & PLAN:  CLL (chronic lymphocytic leukemia) She feels well.  We have discontinued ibrutinib recently and she remained asymptomatic. I suspect a low platelet count may be unrelated to CLL She has no signs of lymphocytosis I plan to see her back in a year for further follow-up We discussed signs and symptoms to watch out for cancer recurrence  Thrombocytopenia Likely unrelated to CLL With stability of her platelet count, I recommend she continues 81 mg aspirin for stroke prevention.   Essential hypertension She has poorly controlled hypertension She is not symptomatic We discussed the importance of close blood Pressure monitoring and follow-up with her cardiologist for medication adjustment if needed  Hypercalcemia She has mild hypercalcemia This is likely due to her fasting state and slight dehydration We will observe only and recheck blood work in the next visit   No orders of the defined types were placed in this encounter.   INTERVAL HISTORY: Please see below for problem oriented charting. She returns for further follow-up She denies recent infection  No new lymphadenopathy Denies excessive bruising She is not symptomatic from high blood pressure.  She is taking all her medication as prescribed by her cardiologist No abnormal anorexia, weight loss or night sweats  SUMMARY OF ONCOLOGIC HISTORY:   CLL (chronic lymphocytic leukemia) ([place])   03/21/2011 Initial Diagnosis    CLL (chronic lymphocytic leukemia)    01/14/2014 - 01/31/2014 Hospital Admission    The patient was hospitalized and was found to severe aortic stenosis causing syncopal episode. She received treatment with steroids with minimum success and  required platelet transfusion.    09/18/2014 Imaging    CT scan of the chest, abdomen and pelvis show lymphadenopathy but low level burden of disease.    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 There was no stenosis. Mild peri-valvular aortic insufficiency is noted    11/04/2014 -  Chemotherapy    She is started on Ibrutinib    06/17/2016 Imaging    1. No lymphadenopathy noted in the chest, abdomen or pelvis to suggest recurrent disease. 2. Aortic atherosclerosis, in addition to left main and 3 vessel coronary artery disease. Status post median sternotomy for CABG including LIMA to the LAD. 3. 3 mm nonobstructive calculus in the lower pole collecting system of the left kidney. Mild atrophy of the left kidney. 4. Mild colonic diverticulosis, without evidence of acute diverticulitis at this time. 5. Additional incidental findings, as above.     REVIEW OF SYSTEMS:   Constitutional: Denies fevers, chills or abnormal weight loss Eyes: Denies blurriness of vision Ears, nose, mouth, throat, and face: Denies mucositis or sore throat Respiratory: Denies cough, dyspnea or wheezes Cardiovascular: Denies palpitation, chest discomfort or lower extremity swelling Gastrointestinal:  Denies nausea, heartburn or change in bowel habits Skin: Denies abnormal skin rashes Lymphatics: Denies new lymphadenopathy or easy bruising Neurological:Denies numbness, tingling or new weaknesses Behavioral/Psych: Mood is stable, no new changes  All other systems were reviewed with the patient and are negative.  I have reviewed the past medical history, past surgical history, social history and family history with the patient and they are unchanged from previous note.  ALLERGIES:  has No Known Allergies.  MEDICATIONS:  Current Outpatient Medications  Medication Sig Dispense Refill  .  acetaminophen (TYLENOL) 325 MG tablet Take 650 mg by mouth every 6 (six) hours as needed for mild pain or headache.     Marland Kitchen amLODipine (NORVASC) 10 MG tablet TAKE 1 TABLET BY MOUTH ONCE DAILY 90 tablet 1  . amoxicillin (AMOXIL) 500 MG tablet Take 4 tablets by mouth one hour prior to dental procedure 8 tablet 2  . aspirin 81 MG tablet Take 81 mg by mouth daily.    Marland Kitchen atorvastatin (LIPITOR) 10 MG tablet TAKE 1 TABLET BY MOUTH ONCE DAILY AT  6  P.M. 90 tablet 0  . lisinopril (PRINIVIL,ZESTRIL) 40 MG tablet Take 1 tablet (40 mg total) by mouth daily. 90 tablet 3  . metoprolol succinate (TOPROL-XL) 50 MG 24 hr tablet TAKE 1 TABLET BY MOUTH ONCE DAILY WITH  OR  IMMEDIATELY  FOLLOWING  A  MEAL 90 tablet 1  . Multiple Vitamins-Minerals (HM MULTIVITAMIN ADULT GUMMY PO) Take 1 capsule by mouth daily.     No current facility-administered medications for this visit.     PHYSICAL EXAMINATION: ECOG PERFORMANCE STATUS: 1 - Symptomatic but completely ambulatory  Vitals:   10/05/17 1225  BP: (!) 163/46  Pulse: 64  Resp: 18  Temp: 97.8 F (36.6 C)  SpO2: 100%   Filed Weights   10/05/17 1225  Weight: 97 lb 12.8 oz (44.4 kg)    GENERAL:alert, no distress and comfortable SKIN: skin color, texture, turgor are normal, no rashes or significant lesions EYES: normal, Conjunctiva are pink and non-injected, sclera clear OROPHARYNX:no exudate, no erythema and lips, buccal mucosa, and tongue normal  NECK: supple, thyroid normal size, non-tender, without nodularity LYMPH:  no palpable lymphadenopathy in the cervical, axillary or inguinal LUNGS: clear to auscultation and percussion with normal breathing effort HEART: regular rate & rhythm and no murmurs and no lower extremity edema ABDOMEN:abdomen soft, non-tender and normal bowel sounds Musculoskeletal:no cyanosis of digits and no clubbing  NEURO: alert & oriented x 3 with fluent speech, no focal motor/sensory deficits  LABORATORY DATA:  I have reviewed the  data as listed    Component Value Date/Time   NA 137 10/05/2017 1145   NA 136 07/22/2017 1054   NA 137 12/18/2016 0916   K 4.2 10/05/2017 1145   K 4.4 12/18/2016 0916   CL 102 10/05/2017 1145   CL 105 05/20/2012 0823   CO2 28 10/05/2017 1145   CO2 25 12/18/2016 0916   GLUCOSE 93 10/05/2017 1145   GLUCOSE 88 12/18/2016 0916   GLUCOSE 70 05/20/2012 0823   BUN 20 10/05/2017 1145   BUN 15 07/22/2017 1054   BUN 14.0 12/18/2016 0916   CREATININE 1.07 (H) 10/05/2017 1145   CREATININE 1.0 12/18/2016 0916   CALCIUM 10.5 (H) 10/05/2017 1145   CALCIUM 9.9 12/18/2016 0916   PROT 6.2 (L) 10/05/2017 1145   PROT 6.1 (L) 12/18/2016 0916   ALBUMIN 4.1 10/05/2017 1145   ALBUMIN 3.9 12/18/2016 0916   AST 27 10/05/2017 1145   AST 28 12/18/2016 0916   ALT 18 10/05/2017 1145   ALT 16 12/18/2016 0916   ALKPHOS 61 10/05/2017 1145   ALKPHOS 54 12/18/2016 0916   BILITOT 0.9 10/05/2017 1145   BILITOT 1.03 12/18/2016 0916   GFRNONAA 49 (L) 10/05/2017 1145   GFRAA 57 (L) 10/05/2017 1145    No results found for: SPEP, UPEP  Lab Results  Component Value Date  WBC 5.8 10/05/2017   NEUTROABS 1.9 10/05/2017   HGB 13.1 10/05/2017   HCT 38.5 10/05/2017   MCV 92.3 10/05/2017   PLT 110 (L) 10/05/2017      Chemistry      Component Value Date/Time   NA 137 10/05/2017 1145   NA 136 07/22/2017 1054   NA 137 12/18/2016 0916   K 4.2 10/05/2017 1145   K 4.4 12/18/2016 0916   CL 102 10/05/2017 1145   CL 105 05/20/2012 0823   CO2 28 10/05/2017 1145   CO2 25 12/18/2016 0916   BUN 20 10/05/2017 1145   BUN 15 07/22/2017 1054   BUN 14.0 12/18/2016 0916   CREATININE 1.07 (H) 10/05/2017 1145   CREATININE 1.0 12/18/2016 0916      Component Value Date/Time   CALCIUM 10.5 (H) 10/05/2017 1145   CALCIUM 9.9 12/18/2016 0916   ALKPHOS 61 10/05/2017 1145   ALKPHOS 54 12/18/2016 0916   AST 27 10/05/2017 1145   AST 28 12/18/2016 0916   ALT 18 10/05/2017 1145   ALT 16 12/18/2016 0916   BILITOT 0.9  10/05/2017 1145   BILITOT 1.03 12/18/2016 0916      All questions were answered. The patient knows to call the clinic with any problems, questions or concerns. No barriers to learning was detected.  I spent 15 minutes counseling the patient face to face. The total time spent in the appointment was 20 minutes and more than 50% was on counseling and review of test results  Heath Lark, MD 10/05/2017 4:27 PM

## 2017-10-05 NOTE — Assessment & Plan Note (Signed)
Likely unrelated to CLL With stability of her platelet count, I recommend she continues 81 mg aspirin for stroke prevention.

## 2017-10-05 NOTE — Assessment & Plan Note (Signed)
She has poorly controlled hypertension She is not symptomatic We discussed the importance of close blood Pressure monitoring and follow-up with her cardiologist for medication adjustment if needed

## 2017-10-09 ENCOUNTER — Ambulatory Visit (INDEPENDENT_AMBULATORY_CARE_PROVIDER_SITE_OTHER): Payer: Medicare Other | Admitting: Family

## 2017-10-09 ENCOUNTER — Encounter: Payer: Self-pay | Admitting: Family

## 2017-10-09 ENCOUNTER — Other Ambulatory Visit: Payer: Self-pay

## 2017-10-09 ENCOUNTER — Ambulatory Visit (HOSPITAL_COMMUNITY)
Admission: RE | Admit: 2017-10-09 | Discharge: 2017-10-09 | Disposition: A | Discharge status: Home / Self Care | Payer: Medicare Other | Source: Ambulatory Visit | Attending: Family | Admitting: Family

## 2017-10-09 VITALS — BP 192/61 | HR 60 | Temp 97.2°F | Resp 18 | Ht 60.0 in | Wt 97.5 lb

## 2017-10-09 DIAGNOSIS — I10 Essential (primary) hypertension: Secondary | ICD-10-CM | POA: Insufficient documentation

## 2017-10-09 DIAGNOSIS — I6523 Occlusion and stenosis of bilateral carotid arteries: Secondary | ICD-10-CM

## 2017-10-09 DIAGNOSIS — I2510 Atherosclerotic heart disease of native coronary artery without angina pectoris: Secondary | ICD-10-CM | POA: Insufficient documentation

## 2017-10-09 DIAGNOSIS — I6522 Occlusion and stenosis of left carotid artery: Secondary | ICD-10-CM

## 2017-10-09 NOTE — Patient Instructions (Signed)

## 2017-10-09 NOTE — Progress Notes (Signed)
Chief Complaint: Follow up Extracranial Carotid Artery Stenosis   History of Present Illness  Nicole Mercado is a 77 y.o. female who is s/p left carotid endarterectomy in June of 2013 by Dr. Donnetta Hutching.   She has no history of TIA or stroke. Specifically she denies a history of amaurosis fugax or monocular blindness, unilateral facial drooping, hemiplegia, or receptive or expressive aphasia.   Her left thigh claudication has resolved since the CABG.  She has a CABG x 4 vessels January 2016 with aortic valve replacement.  Dr. Burt Knack is her cardiologist.  She is in remission for CLL, Dr. Alvy Bimler is her hematologist.   Pt states her blood pressure at home is about 140/60.   Pt Diabetic: No Pt smoker: non-smoker  Pt meds include: Statin : yes  ASA: Yes Other anticoagulants/antiplatelets: no    Past Medical History:  Diagnosis Date  . Aortic stenosis    a. Echocardiogram (01/11/14):  Mild LVH, EF 60-65%, Gr 1 DD, possible bicuspid AV, severe AS (mean 61 mmHg, peak 104 mmHg), mild MVP of post leaflet, mild MR, PASP 33 mmHg.;  b. 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 Arthritis   . Atrial fibrillation ([place])    post op after CABG+AVR >> Amiodarone  . CAD (coronary artery disease)    a. LHC (01/13/14):  dLM 70 extending into oLAD, mLAD 40-50, pD1 80, pD2 70, ostial/prox OM1 70 >> CABG (L-LAD, S-OM1, S-D1, S-D2)    . Carotid artery occlusion    a. s/p L CEA 2013;  b.  Carotid US (1/16):  Bilateral ICA 1-39%  . CLL (chronic lymphocytic leukemia) (HCC)    slow leukemia---Dr  Murinson  . H/O exercise stress test    NSSTT  . Hx of cardiovascular stress test    a. Nuclear stress test That (4/13): Normal perfusion, EF 74%  . Hx of echocardiogram    Echo (2/16):  Mild LVH, EF 60-65%, no RWMA, Gr 2 DD, AVR ok (mean 9 mmHg), trivial AI, mild MR, mild LAE, PASP 40 mmHg  . Hypertension   . Pancreatitis    h/o  . Thrombocytopenia ([place]) 11/19/2010    Social History Social History    Tobacco Use  . Smoking status: Never Smoker  . Smokeless tobacco: Never Used  Substance Use Topics  . Alcohol use: No  . Drug use: No    Family History Family History  Problem Relation Age of Onset  . Heart disease Mother        in her 21s  . Hypertension Mother   . Heart attack Mother   . Heart disease Father   . Hypertension Father   . Hypertension Sister   . Hypertension Son   . Stroke Paternal Grandmother   . Stroke Paternal Grandfather     Surgical History Past Surgical History:  Procedure Laterality Date  . ABDOMINAL HYSTERECTOMY    . AORTIC VALVE REPLACEMENT [name] 01/17/2014   Procedure: AORTIC VALVE REPLACEMENT (AVR);  Surgeon: Gaye Pollack, MD;  Location: [place];  Service: Open Heart Surgery;  Laterality: [name];  . APPENDECTOMY    . CAROTID ENDARTERECTOMY  06/09/11   LEFT  cea  . CESAREAN SECTION     FIVE  . CHOLECYSTECTOMY    . CORONARY ARTERY BYPASS GRAFT [name] 01/17/2014   Procedure: CORONARY ARTERY BYPASS GRAFTING (CABG)TIMES FOUR USING LEFT INTERNAL MAMMARY ARTERY AND RIGHT SAPHENOUS VEIN HARVESTED ENDOSCOPICALLY;  Surgeon: Gaye Pollack, MD;  Location: [place];  Service: Open Heart Surgery;  Laterality: [name];  . ENDARTERECTOMY  06/09/2011   Procedure: ENDARTERECTOMY CAROTID;  Surgeon: Rosetta Posner, MD;  [facility];  Service: Vascular;  Laterality: Left;  left carotid endarterectomy with patch angioplasty  . LEFT AND RIGHT HEART CATHETERIZATION WITH CORONARY ANGIOGRAM [name] 01/13/2014   Procedure: LEFT AND RIGHT HEART CATHETERIZATION WITH CORONARY ANGIOGRAM;  Surgeon: Jettie Booze, MD;  Location: [facility] CATH LAB;  Service: Cardiovascular;  Laterality: [name];  . TEE WITHOUT CARDIOVERSION [name] 01/17/2014   Procedure: TRANSESOPHAGEAL ECHOCARDIOGRAM (TEE);  Surgeon: Gaye Pollack, MD;  Location: [place];  Service: Open Heart Surgery;  Laterality: [name];  . TONSILLECTOMY      No Known Allergies  Current Outpatient Medications  Medication Sig Dispense Refill  .  acetaminophen (TYLENOL) 325 MG tablet Take 650 mg by mouth every 6 (six) hours as needed for mild pain or headache.     Marland Kitchen amLODipine (NORVASC) 10 MG tablet TAKE 1 TABLET BY MOUTH ONCE DAILY 90 tablet 1  . amoxicillin (AMOXIL) 500 MG tablet Take 4 tablets by mouth one hour prior to dental procedure 8 tablet 2  . aspirin 81 MG tablet Take 81 mg by mouth daily.    Marland Kitchen atorvastatin (LIPITOR) 10 MG tablet TAKE 1 TABLET BY MOUTH ONCE DAILY AT  6  P.M. 90 tablet 0  . lisinopril (PRINIVIL,ZESTRIL) 40 MG tablet Take 1 tablet (40 mg total) by mouth daily. 90 tablet 3  . metoprolol succinate (TOPROL-XL) 50 MG 24 hr tablet TAKE 1 TABLET BY MOUTH ONCE DAILY WITH  OR  IMMEDIATELY  FOLLOWING  A  MEAL 90 tablet 1  . Multiple Vitamins-Minerals (HM MULTIVITAMIN ADULT GUMMY PO) Take 1 capsule by mouth daily.     No current facility-administered medications for this visit.     Review of Systems : See HPI for pertinent positives and negatives.  Physical Examination  Vitals:   10/09/17 1119 10/09/17 1120  BP: (!) 158/67 (!) 192/61  Pulse: 60   Resp: 18   Temp: (!) 97.2 F (36.2 C)   TempSrc: Oral   SpO2: 100%   Weight: 97 lb 8 oz (44.2 kg)   Height: 5' (1.524 m)    Body mass index is 19.04 kg/m.  General: WDWN female in NAD GAIT: normal Eyes: PERRLA HENT: No gross abnormalities.  Pulmonary:  Respirations are non-labored, good air movement in all fields, CTAB, no rales, rhonchi, or wheezing. Cardiac: regular rhythm, no detected murmur.  VASCULAR EXAM Carotid Bruits Right Left   Negative Negative     Abdominal aortic pulse is not palpable. Radial pulses are 2+ palpable and equal.                                                                                                                            LE Pulses Right Left       POPLITEAL  not palpable   not palpable       POSTERIOR TIBIAL  not palpable  not palpable        DORSALIS PEDIS      ANTERIOR TIBIAL 1+ palpable  1+ palpable      Gastrointestinal: soft, nontender, BS WNL, no r/g, no palpable masses. Musculoskeletal: no muscle atrophy/wasting. M/S 5/5 throughout, extremities without ischemic changes. Skin: No rashes, no ulcers, no cellulitis.   Neurologic:  A&O X 3; appropriate affect, sensation is normal; speech is normal, CN 2-12 intact, pain and light touch intact in extremities, motor exam as listed above. Psychiatric: Normal thought content, mood appropriate to clinical situation.    Assessment: Nicole Mercado is a 77 y.o. female who is s/pleft carotid endarterectomy in June of 2013.  She has no history of stroke or TIA.   She does not have DM and has never used tobacco. She takes a daily statin and ASA.    DATA Carotid Duplex (10-09-17): Right ICA: 1-39% stenosis Left ICA: CEA site with 1-39% stenosis Right ECA is occluded Bilateral vertebral artery flow is antegrade.  Bilateral subclavian artery waveforms are normal.  No significant change compared to the exam on 09-09-16.      Plan: Follow-up in 2 years with Carotid Duplex scan.   I discussed in depth with the patient the nature of atherosclerosis, and emphasized the importance of maximal medical management including strict control of blood pressure, blood glucose, and lipid levels, obtaining regular exercise, and continued cessation of smoking.  The patient is aware that without maximal medical management the underlying atherosclerotic disease process will progress, limiting the benefit of any interventions. The patient was given information about stroke prevention and what symptoms should prompt the patient to seek immediate medical care. Thank you for allowing [place] to participate in this patient's care.  Clemon Chambers, RN, MSN, FNP-C [facility] Office: [phone]  Clinic Physician: Donzetta Matters  10/09/17 11:50 AM

## 2017-12-14 ENCOUNTER — Encounter: Payer: Self-pay | Admitting: Cardiovascular Disease

## 2017-12-14 ENCOUNTER — Ambulatory Visit: Payer: Medicare Other | Admitting: Cardiovascular Disease

## 2017-12-14 ENCOUNTER — Other Ambulatory Visit: Payer: Self-pay | Admitting: Cardiovascular Disease

## 2017-12-14 VITALS — BP 184/72 | HR 64 | Ht 60.0 in | Wt 98.8 lb

## 2017-12-14 DIAGNOSIS — I10 Essential (primary) hypertension: Secondary | ICD-10-CM

## 2017-12-14 DIAGNOSIS — I25709 Atherosclerosis of coronary artery bypass graft(s), unspecified, with unspecified angina pectoris: Secondary | ICD-10-CM | POA: Diagnosis not present

## 2017-12-14 DIAGNOSIS — E782 Mixed hyperlipidemia: Secondary | ICD-10-CM

## 2017-12-14 DIAGNOSIS — I359 Nonrheumatic aortic valve disorder, unspecified: Secondary | ICD-10-CM | POA: Diagnosis not present

## 2017-12-14 MED ORDER — SPIRONOLACTONE 25 MG PO TABS: 12.5000 mg | ORAL_TABLET | Freq: Every day | ORAL | 3 refills | Status: DC

## 2017-12-14 NOTE — Progress Notes (Signed)
Cardiology Office Note:    Date:  12/14/2017   ID:  Nicole Mercado, DOB 1940-09-19, MRN [id number]  PCP:  Kristie Cowman, MD  Cardiologist:  No primary care provider on file.  Electrophysiologist:  None   Referring MD: Kristie Cowman, MD   Chief Complaint  Patient presents with  . Coronary Artery Disease   History of Present Illness:    Nicole Mercado is a 77 y.o. female with a hx of coronary and aortic valve disease, presenting for follow-up evaluation.  The patient underwent bioprosthetic aortic valve replacement and CABG in 2016.  She is had mild paravalvular regurgitation followed by serial echo studies with stability demonstrated.  The patient is here with her husband today.  She is feeling well.  She denies chest pain, dyspnea, orthopnea, or PND.  She complains of easy bruising.  She also complains of brown spots on her skin.  She has not seen her PCP or a dermatologist about this.  She brings in home blood pressure readings with most systolic readings in the 657Q and diastolic readings in the 46N and 60s.  She denies symptoms of lightheadedness, dizziness, or syncope.  She is compliant with her medications.  She takes her antihypertensives in the morning and record her blood pressure prior to taking her medication.  Past Medical History:  Diagnosis Date  . Aortic stenosis    a. Echocardiogram (01/11/14):  Mild LVH, EF 60-65%, Gr 1 DD, possible bicuspid AV, severe AS (mean 61 mmHg, peak 104 mmHg), mild MVP of post leaflet, mild MR, PASP 33 mmHg.;  b. 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 Arthritis   . Atrial fibrillation ([place])    post op after CABG+AVR >> Amiodarone  . CAD (coronary artery disease)    a. LHC (01/13/14):  dLM 70 extending into oLAD, mLAD 40-50, pD1 80, pD2 70, ostial/prox OM1 70 >> CABG (L-LAD, S-OM1, S-D1, S-D2)    . Carotid artery occlusion    a. s/p L CEA 2013;  b.  Carotid US (1/16):  Bilateral ICA 1-39%  . CLL (chronic lymphocytic leukemia) (HCC)    slow leukemia---Dr   Murinson  . H/O exercise stress test    NSSTT  . Hx of cardiovascular stress test    a. Nuclear stress test That (4/13): Normal perfusion, EF 74%  . Hx of echocardiogram    Echo (2/16):  Mild LVH, EF 60-65%, no RWMA, Gr 2 DD, AVR ok (mean 9 mmHg), trivial AI, mild MR, mild LAE, PASP 40 mmHg  . Hypertension   . Pancreatitis    h/o  . Thrombocytopenia ([place]) 11/19/2010    Past Surgical History:  Procedure Laterality Date  . ABDOMINAL HYSTERECTOMY    . AORTIC VALVE REPLACEMENT [name] 01/17/2014   Procedure: AORTIC VALVE REPLACEMENT (AVR);  Surgeon: Gaye Pollack, MD;  Location: [place];  Service: Open Heart Surgery;  Laterality: [name];  . APPENDECTOMY    . CAROTID ENDARTERECTOMY  06/09/11   LEFT  cea  . CESAREAN SECTION     FIVE  . CHOLECYSTECTOMY    . CORONARY ARTERY BYPASS GRAFT [name] 01/17/2014   Procedure: CORONARY ARTERY BYPASS GRAFTING (CABG)TIMES FOUR USING LEFT INTERNAL MAMMARY ARTERY AND RIGHT SAPHENOUS VEIN HARVESTED ENDOSCOPICALLY;  Surgeon: Gaye Pollack, MD;  Location: [place];  Service: Open Heart Surgery;  Laterality: [name];  . ENDARTERECTOMY  06/09/2011   Procedure: ENDARTERECTOMY CAROTID;  Surgeon: Rosetta Posner, MD;  Location: [place];  Service: Vascular;  Laterality: Left;  left carotid  endarterectomy with patch angioplasty  . LEFT AND RIGHT HEART CATHETERIZATION WITH CORONARY ANGIOGRAM [name] 01/13/2014   Procedure: LEFT AND RIGHT HEART CATHETERIZATION WITH CORONARY ANGIOGRAM;  Surgeon: Jettie Booze, MD;  Location: [facility] CATH LAB;  Service: Cardiovascular;  Laterality: [name];  . TEE WITHOUT CARDIOVERSION [name] 01/17/2014   Procedure: TRANSESOPHAGEAL ECHOCARDIOGRAM (TEE);  Surgeon: Gaye Pollack, MD;  Location: [place];  Service: Open Heart Surgery;  Laterality: [name];  . TONSILLECTOMY      Current Medications: Current Meds  Medication Sig  . acetaminophen (TYLENOL) 325 MG tablet Take 650 mg by mouth every 6 (six) hours as needed for mild pain or headache.   Marland Kitchen amLODipine (NORVASC) 10 MG  tablet TAKE 1 TABLET BY MOUTH ONCE DAILY  . amoxicillin (AMOXIL) 500 MG tablet Take 4 tablets by mouth one hour prior to dental procedure  . aspirin 81 MG tablet Take 81 mg by mouth daily.  Marland Kitchen atorvastatin (LIPITOR) 10 MG tablet TAKE 1 TABLET BY MOUTH ONCE DAILY AT  6  P.M.  . lisinopril (PRINIVIL,ZESTRIL) 40 MG tablet Take 1 tablet (40 mg total) by mouth daily.  . metoprolol succinate (TOPROL-XL) 50 MG 24 hr tablet TAKE 1 TABLET BY MOUTH ONCE DAILY WITH  OR  IMMEDIATELY  FOLLOWING  A  MEAL  . Multiple Vitamins-Minerals (HM MULTIVITAMIN ADULT GUMMY PO) Take 1 capsule by mouth daily.     Allergies:   Patient has no known allergies.   Social History   Socioeconomic History  . Marital status: Widowed    Spouse name: Not on file  . Number of children: Not on file  . Years of education: Not on file  . Highest education level: Not on file  Occupational History  . Not on file  Social Needs  . Financial resource strain: Not on file  . Food insecurity:    Worry: Not on file    Inability: Not on file  . Transportation needs:    Medical: Not on file    Non-medical: Not on file  Tobacco Use  . Smoking status: Never Smoker  . Smokeless tobacco: Never Used  Substance and Sexual Activity  . Alcohol use: No  . Drug use: No  . Sexual activity: Not on file  Lifestyle  . Physical activity:    Days per week: Not on file    Minutes per session: Not on file  . Stress: Not on file  Relationships  . Social connections:    Talks on phone: Not on file    Gets together: Not on file    Attends religious service: Not on file    Active member of club or organization: Not on file    Attends meetings of clubs or organizations: Not on file    Relationship status: Not on file  Other Topics Concern  . Not on file  Social History Narrative  . Not on file     Family History: The patient's family history includes Heart attack in her mother; Heart disease in her father and mother; Hypertension in her  father, mother, sister, and son; Stroke in her paternal grandfather and paternal grandmother.  ROS:   Please see the history of present illness.    All other systems reviewed and are negative.  EKGs/Labs/Other Studies Reviewed:    The following studies were reviewed today: 2D echocardiogram 12/26/2016: Study Conclusions  - Left ventricle: The cavity size was normal. Wall thickness was   increased in a pattern of mild LVH. Systolic function  was normal.   The estimated ejection fraction was in the range of 60% to 65%.   Features are consistent with a pseudonormal left ventricular   filling pattern, with concomitant abnormal relaxation and   increased filling pressure (grade 2 diastolic dysfunction).   Doppler parameters are consistent with high ventricular filling   pressure. - Aortic valve: AV is well seated. Peak and mean gradients through   the valve are 23 and 10 mm Hg respectively There is mild   perivalvular AI. No significant change from echo from 2017 There   was mild regurgitation. - Mitral valve: Calcified annulus. Mildly thickened leaflets .   There was mild regurgitation. - Left atrium: The atrium was mildly dilated. - Right atrium: The atrium was mildly dilated. - Pulmonary arteries: PA peak pressure: 36 mm Hg (S).  EKG:  EKG is ordered today.  The ekg ordered today demonstrates normal sinus rhythm 64 bpm, age-indeterminate anteroseptal infarct,  Recent Labs: 10/05/2017: ALT 18; BUN 20; Creatinine, Ser 1.07; Hemoglobin 13.1; Platelets 110; Potassium 4.2; Sodium 137  Recent Lipid Panel    Component Value Date/Time   CHOL 140 05/01/2015 0920   TRIG 115 05/01/2015 0920   HDL 69 05/01/2015 0920   CHOLHDL 2.0 05/01/2015 0920   VLDL 23 05/01/2015 0920   LDLCALC 48 05/01/2015 0920    Physical Exam:    VS:  BP (!) 184/72   Pulse 64   Ht 5' (1.524 m)   Wt 98 lb 12.8 oz (44.8 kg)   SpO2 100%   BMI 19.30 kg/m     Wt Readings from Last 3 Encounters:  12/14/17 98  lb 12.8 oz (44.8 kg)  10/09/17 97 lb 8 oz (44.2 kg)  10/05/17 97 lb 12.8 oz (44.4 kg)     GEN: Well nourished, well developed in no acute distress HEENT: Normal NECK: No JVD; No carotid bruits LYMPHATICS: No lymphadenopathy CARDIAC: RRR, 2/6 SEM at the RUSB, no diastolic murmur appreciable RESPIRATORY:  Clear to auscultation without rales, wheezing or rhonchi  ABDOMEN: Soft, non-tender, non-distended MUSCULOSKELETAL:  No edema; No deformity  SKIN: Warm and dry NEUROLOGIC:  Alert and oriented x 3 PSYCHIATRIC:  Normal affect   ASSESSMENT:    1. Aortic valve disorder   2. Coronary artery disease involving coronary bypass graft of native heart with angina pectoris ([place])   3. Essential hypertension   4. Mixed hyperlipidemia    PLAN:    In order of problems listed above:  1. Exam is stable.  I recommended a repeat echocardiogram next year prior to her office visit.  Mild paravalvular regurgitation is noted. 2. The patient is tolerating low-dose aspirin.  No changes are made today.  She is on a beta-blocker and a statin drug. 3. Blood pressure control remains suboptimal.  She is on amlodipine, lisinopril, and metoprolol succinate.  I recommended the addition of Spironolactone 12.5 mg with a metabolic panel in about 2 weeks. 4. The patient is treated with a statin drug.  She is due for a lipid panel which will be drawn with her upcoming labs.   Medication Adjustments/Labs and Tests Ordered: Current medicines are reviewed at length with the patient today.  Concerns regarding medicines are outlined above.  Orders Placed This Encounter  Procedures  . Basic metabolic panel  . Lipid panel  . EKG 12-Lead  . ECHOCARDIOGRAM COMPLETE   Meds ordered this encounter  Medications  . spironolactone (ALDACTONE) 25 MG tablet    Sig: Take 0.5 tablets (12.5  mg total) by mouth daily.    Dispense:  45 tablet    Refill:  3    Patient Instructions  Medication Instructions:  1) START ALDACTONE  12.5 mg daily  Labwork: Please return in around 2 weeks for blood work. Please come fasting as we will check your cholesterol. You may come any time between 7:30AM and 5:00PM. Don't forget to check in when you arrive!  Testing/Procedures: Your provider has requested that you have an echocardiogram in 1 year. Echocardiography is a painless test that uses sound waves to create images of your heart. It provides your doctor with information about the size and shape of your heart and how well your heart's chambers and valves are working. This procedure takes approximately one hour. There are no restrictions for this procedure.  Follow-Up: You have an appointment scheduled with Richardson Dopp on Tuesday, 06/15/2018 at 8:15AM.   You will be called to arrange your echocardiogram and 1 year visit with Dr. Burt Knack.  Any Other Special Instructions Will Be Listed Below (If Applicable). Please monitor your blood pressure. If your blood pressure is consistently over 140/90 please call the office.      Signed, Sherren Mocha, MD  12/14/2017 5:15 PM    [facility]

## 2017-12-14 NOTE — Patient Instructions (Addendum)
Medication Instructions:  1) START ALDACTONE 12.5 mg daily  Labwork: Please return in around 2 weeks for blood work. Please come fasting as we will check your cholesterol. You may come any time between 7:30AM and 5:00PM. Don't forget to check in when you arrive!  Testing/Procedures: Your provider has requested that you have an echocardiogram in 1 year. Echocardiography is a painless test that uses sound waves to create images of your heart. It provides your doctor with information about the size and shape of your heart and how well your heart's chambers and valves are working. This procedure takes approximately one hour. There are no restrictions for this procedure.  Follow-Up: You have an appointment scheduled with Richardson Dopp on Tuesday, 06/15/2018 at 8:15AM.   You will be called to arrange your echocardiogram and 1 year visit with Dr. Burt Knack.  Any Other Special Instructions Will Be Listed Below (If Applicable). Please monitor your blood pressure. If your blood pressure is consistently over 140/90 please call the office.

## 2018-01-04 ENCOUNTER — Other Ambulatory Visit: Payer: Medicare Other

## 2018-01-08 ENCOUNTER — Other Ambulatory Visit: Payer: Medicare Other | Admitting: *Deleted

## 2018-01-08 ENCOUNTER — Other Ambulatory Visit: Payer: Self-pay

## 2018-01-08 DIAGNOSIS — I10 Essential (primary) hypertension: Secondary | ICD-10-CM

## 2018-01-08 DIAGNOSIS — E782 Mixed hyperlipidemia: Secondary | ICD-10-CM

## 2018-01-08 LAB — LIPID PANEL
Chol/HDL Ratio: 2.2 ratio (ref 0.0–4.4)
Cholesterol, Total: 154 mg/dL (ref 100–199)
HDL: 70 mg/dL (ref >39)
LDL Calculated: 59 mg/dL (ref 0–99)
Triglycerides: 125 mg/dL (ref 0–149)
VLDL Cholesterol Cal: 25 mg/dL (ref 5–40)

## 2018-01-08 LAB — BASIC METABOLIC PANEL
BUN / CREAT RATIO: 18 (ref 12–28)
BUN: 23 mg/dL (ref 8–27)
CO2: 21 mmol/L (ref 20–29)
Calcium: 10.5 mg/dL — ABNORMAL HIGH (ref 8.7–10.3)
Chloride: 102 mmol/L (ref 96–106)
Creatinine, Ser: 1.28 mg/dL — ABNORMAL HIGH (ref 0.57–1.00)
GFR calc Af Amer: 47 mL/min/{1.73_m2} — ABNORMAL LOW (ref >59)
GFR calc non Af Amer: 40 mL/min/{1.73_m2} — ABNORMAL LOW (ref >59)
GLUCOSE: 89 mg/dL (ref 65–99)
Potassium: 4.7 mmol/L (ref 3.5–5.2)
Sodium: 139 mmol/L (ref 134–144)

## 2018-01-19 ENCOUNTER — Other Ambulatory Visit: Payer: Self-pay | Admitting: Cardiovascular Disease

## 2018-01-19 DIAGNOSIS — I10 Essential (primary) hypertension: Secondary | ICD-10-CM

## 2018-01-25 ENCOUNTER — Other Ambulatory Visit: Payer: Self-pay | Admitting: Cardiovascular Disease

## 2018-06-10 ENCOUNTER — Telehealth: Payer: Self-pay | Admitting: *Deleted

## 2018-06-10 NOTE — Telephone Encounter (Signed)
Attempted to contact patient  No vm set up for home or cell

## 2018-06-10 NOTE — Telephone Encounter (Signed)

## 2018-06-10 NOTE — Telephone Encounter (Signed)
  Patient called back, appt changed to telephone visit

## 2018-06-14 NOTE — Progress Notes (Signed)
Virtual Visit via Telephone Note   This visit type was conducted due to national recommendations for restrictions regarding the COVID-19 Pandemic (e.g. social distancing) in an effort to limit this patient's exposure and mitigate transmission in our community.  Due to her co-morbid illnesses, this patient is at least at moderate risk for complications without adequate follow up.  This format is felt to be most appropriate for this patient at this time.  The patient did not have access to video technology/had technical difficulties with video requiring transitioning to audio format only (telephone).  All issues noted in this document were discussed and addressed.  No physical exam could be performed with this format.  Please refer to the patient's chart for her  consent to telehealth for [facility].   Date:  06/15/2018   ID:  Nicole Mercado, DOB 01/26/1940, MRN [id number]  Patient Location: Home Provider Location: Home  PCP:  Kristie Cowman, MD  Cardiologist:  Sherren Mocha, MD   Electrophysiologist:  None   Evaluation Performed:  Follow-Up Visit  Chief Complaint: Follow-up on coronary artery disease, history of aortic valve replacement and CABG  History of Present Illness:    Nicole Mercado is a 78 y.o. female with CLL, chronic thrombocytopenia, HTN, carotid stenosis, s/p L CEA 6/13, severe AS, CAD s/p CABG+bioprosthetic AVR in 1/16 (Dr. Cyndia Bent - L-LAD, S-OM1, S-D1, S-D2).  She was last seen by Dr. Burt Knack in December 2019.    Today, she notes she is doing well.  She has occasional dizziness in the mornings.  She has not had syncope.  She has not had chest pain or shortness of breath.  She has not had orthopnea or significant lower extremity swelling.  She is not had any fevers, nausea, vomiting, diarrhea, cough.  The patient does not have symptoms concerning for COVID-19 infection (fever, chills, cough, or new shortness of breath).    Past Medical History:  Diagnosis Date  . Aortic  stenosis    a. Echocardiogram (01/11/14):  Mild LVH, EF 60-65%, Gr 1 DD, possible bicuspid AV, severe AS (mean 61 mmHg, peak 104 mmHg), mild MVP of post leaflet, mild MR, PASP 33 mmHg.;  b. 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 Arthritis   . Atrial fibrillation ([place])    post op after CABG+AVR >> Amiodarone  . CAD (coronary artery disease)    a. LHC (01/13/14):  dLM 70 extending into oLAD, mLAD 40-50, pD1 80, pD2 70, ostial/prox OM1 70 >> CABG (L-LAD, S-OM1, S-D1, S-D2)    . Carotid artery occlusion    a. s/p L CEA 2013;  b.  Carotid US (1/16):  Bilateral ICA 1-39%  . CLL (chronic lymphocytic leukemia) (HCC)    slow leukemia---Dr  Murinson  . H/O exercise stress test    NSSTT  . Hx of cardiovascular stress test    a. Nuclear stress test That (4/13): Normal perfusion, EF 74%  . Hx of echocardiogram    Echo (2/16):  Mild LVH, EF 60-65%, no RWMA, Gr 2 DD, AVR ok (mean 9 mmHg), trivial AI, mild MR, mild LAE, PASP 40 mmHg  . Hypertension   . Pancreatitis    h/o  . Thrombocytopenia ([place]) 11/19/2010   Past Surgical History:  Procedure Laterality Date  . ABDOMINAL HYSTERECTOMY    . AORTIC VALVE REPLACEMENT [name] 01/17/2014   Procedure: AORTIC VALVE REPLACEMENT (AVR);  Surgeon: Gaye Pollack, MD;  Location: [place];  Service: Open Heart Surgery;  Laterality: [name];  . APPENDECTOMY    .  CAROTID ENDARTERECTOMY  06/09/11   LEFT  cea  . CESAREAN SECTION     FIVE  . CHOLECYSTECTOMY    . CORONARY ARTERY BYPASS GRAFT [name] 01/17/2014   Procedure: CORONARY ARTERY BYPASS GRAFTING (CABG)TIMES FOUR USING LEFT INTERNAL MAMMARY ARTERY AND RIGHT SAPHENOUS VEIN HARVESTED ENDOSCOPICALLY;  Surgeon: Gaye Pollack, MD;  Location: [place];  Service: Open Heart Surgery;  Laterality: [name];  . ENDARTERECTOMY  06/09/2011   Procedure: ENDARTERECTOMY CAROTID;  Surgeon: Rosetta Posner, MD;  Location[facility];  Service: Vascular;  Laterality: Left;  left carotid endarterectomy with patch angioplasty  . LEFT AND RIGHT HEART CATHETERIZATION WITH  CORONARY ANGIOGRAM [name] 01/13/2014   Procedure: LEFT AND RIGHT HEART CATHETERIZATION WITH CORONARY ANGIOGRAM;  Surgeon: Jettie Booze, MD;  Location: [facility] CATH LAB;  Service: Cardiovascular;  Laterality: [name];  . TEE WITHOUT CARDIOVERSION [name] 01/17/2014   Procedure: TRANSESOPHAGEAL ECHOCARDIOGRAM (TEE);  Surgeon: Gaye Pollack, MD;  Location: [place];  Service: Open Heart Surgery;  Laterality: [name];  . TONSILLECTOMY       Current Meds  Medication Sig  . acetaminophen (TYLENOL) 325 MG tablet Take 650 mg by mouth every 6 (six) hours as needed for mild pain or headache.   Marland Kitchen amLODipine (NORVASC) 10 MG tablet TAKE 1 TABLET BY MOUTH ONCE DAILY  . amoxicillin (AMOXIL) 500 MG tablet Take 4 tablets by mouth one hour prior to dental procedure  . aspirin 81 MG tablet Take 81 mg by mouth daily.  Marland Kitchen atorvastatin (LIPITOR) 10 MG tablet Take 1 tablet (10 mg total) by mouth daily at 6 PM.  . lisinopril (PRINIVIL,ZESTRIL) 40 MG tablet Take 1 tablet (40 mg total) by mouth daily.  . metoprolol succinate (TOPROL-XL) 50 MG 24 hr tablet TAKE 1 TABLET BY MOUTH ONCE DAILY WITH  OR  IMMEDIATELY  FOLLOWING  A  MEAL  . Multiple Vitamins-Minerals (HM MULTIVITAMIN ADULT GUMMY PO) Chew two gummies daily  . spironolactone (ALDACTONE) 25 MG tablet Take 0.5 tablets (12.5 mg total) by mouth daily.     Allergies:   Patient has no known allergies.   Social History   Tobacco Use  . Smoking status: Never Smoker  . Smokeless tobacco: Never Used  Substance Use Topics  . Alcohol use: No  . Drug use: No     Family Hx: The patient's family history includes Heart attack in her mother; Heart disease in her father and mother; Hypertension in her father, mother, sister, and son; Stroke in her paternal grandfather and paternal grandmother.  ROS:   Please see the history of present illness.     All other systems reviewed and are negative.   Prior CV studies:   The following studies were reviewed today:  Carotid US 10/09/2017  Bilateral ICA 1-39; patent left CEA  Echocardiogram 12/26/2016 Mild LVH, EF 28-31, grade 2 diastolic dysfunction, well-seated AVR with peak and mean gradients 23 and 10, mild paravalvular AI, MAC, mild MR, mild BAE, PASP 36  Echo 09/24/15  Mild concentric LVH, EF 60-65%, normal wall motion, grade 2 diastolic dysfunction, bioprosthetic AVR with mild AI, mean gradient 12 mmHg, mild MR, mild LAE, PASP 35 mmHg   Event Monitor 08/23/15  Rare PACs  No significant bradycardia, no pauses, no atrial fibrillation  No adverse arrhythmias identified   Carotid US 8/17 L CEA patent, R 1-39%   Echo 9/16 EF 55-60%, normal wall motion, grade 1 diastolic dysfunction, bioprosthetic AVR, mild perivalvular AI, mean gradient 9 mmHg, PASP 23 mmHg  LHC (01/13/14):   dLM 70 extending into oLAD, mLAD 40-50, pD1 80, pD2 70, ostial/prox OM1 70   Echocardiogram (01/11/14):   Mild LVH, EF 60-65%, Gr 1 DD, possible bicuspid AV, severe AS (mean 61 mmHg, peak 104 mmHg), mild MVP of post leaflet, mild MR, PASP 33 mmHg.   Carotid US (8/15):   R < 40%, prox R ECA occluded, L CEA ok with velocities suggesting < 40%   Carotid US (1/16):   Bilateral ICA 1-39%   Nuclear stress test (4/13):  Normal perfusion, EF 74%   Labs/Other Tests and Data Reviewed:    EKG:  No ECG reviewed.  Recent Labs: 10/05/2017: ALT 18; Hemoglobin 13.1; Platelets 110 01/08/2018: BUN 23; Creatinine, Ser 1.28; Potassium 4.7; Sodium 139   Recent Lipid Panel Lab Results  Component Value Date/Time   CHOL 154 01/08/2018 08:21 AM   TRIG 125 01/08/2018 08:21 AM   HDL 70 01/08/2018 08:21 AM   CHOLHDL 2.2 01/08/2018 08:21 AM   CHOLHDL 2.0 05/01/2015 09:20 AM   LDLCALC 59 01/08/2018 08:21 AM     Wt Readings from Last 3 Encounters:  06/15/18 98 lb (44.5 kg)  12/14/17 98 lb 12.8 oz (44.8 kg)  10/09/17 97 lb 8 oz (44.2 kg)     Objective:    Vital Signs:  BP (!) 85/57   Ht 5' (1.524 m)   Wt 98 lb (44.5 kg)   BMI 19.14 kg/m    VITAL  SIGNS:  reviewed GEN:  no acute distress RESPIRATORY:  no labored breathing NEURO:  alert and oriented PSYCH:  mood seems to be good  ASSESSMENT & PLAN:    Coronary artery disease involving coronary bypass graft of native heart without angina pectoris S/p CABG.  She is doing well without angina.  Continue aspirin, statin.  S/P AVR Echo in December 2018 with normal EF, moderate diastolic dysfunction, well-seated AVR with mild paravalvular aortic insufficiency.  She has a follow-up echo pending in December 2020.  Bilateral carotid artery disease, unspecified type ([place]) Carotid US in 10/19 with patent left CEA.  Essential hypertension Her blood pressure today is quite low.  She has occasional dizziness but otherwise feels fine.  Several other blood pressure readings that she has obtained have been optimal.  I will arrange a home visit to check a CBC, BMET.  I have also advised her to adjust her medications so that she takes metoprolol and amlodipine in the evening and lisinopril and spironolactone in the morning.  I will ask the nurse practitioner what makes the home visit to check her blood pressure cuff.  I have asked the patient to send me some blood pressure readings after 1 week for review.  Mixed hyperlipidemia LDL optimal on most recent lab work.  Continue current Rx.    CLL (chronic lymphocytic leukemia) ([place]) Currently in remission.  She does describe excessive ecchymosis.  This is a chronic issue and she did have thrombocytopenia in the past.  Obtain CBC as noted.  COVID-19 Education: The signs and symptoms of COVID-19 were discussed with the patient and how to seek care for testing (follow up with PCP or arrange E-visit).   The importance of social distancing was discussed today.  Time:   Today, I have spent 15 minutes with the patient with telehealth technology discussing the above problems.     Medication Adjustments/Labs and Tests Ordered: Current medicines are reviewed  at length with the patient today.  Concerns regarding medicines are outlined above.  Tests Ordered: No orders of the defined types were placed in this encounter.   Medication Changes: No orders of the defined types were placed in this encounter.   Disposition:  Follow up in 6 month(s) with Dr. Burt Knack  Signed, Richardson Dopp, PA-C  06/15/2018 9:57 AM    [place]

## 2018-06-15 ENCOUNTER — Telehealth: Payer: Self-pay | Admitting: Physician Assistant

## 2018-06-15 ENCOUNTER — Telehealth (INDEPENDENT_AMBULATORY_CARE_PROVIDER_SITE_OTHER): Payer: Medicare Other | Admitting: Physician Assistant

## 2018-06-15 ENCOUNTER — Encounter: Payer: Self-pay | Admitting: Physician Assistant

## 2018-06-15 ENCOUNTER — Other Ambulatory Visit: Payer: Self-pay

## 2018-06-15 VITALS — BP 85/57 | Ht 60.0 in | Wt 98.0 lb

## 2018-06-15 DIAGNOSIS — I10 Essential (primary) hypertension: Secondary | ICD-10-CM

## 2018-06-15 DIAGNOSIS — I25810 Atherosclerosis of coronary artery bypass graft(s) without angina pectoris: Secondary | ICD-10-CM

## 2018-06-15 DIAGNOSIS — I779 Disorder of arteries and arterioles, unspecified: Secondary | ICD-10-CM

## 2018-06-15 DIAGNOSIS — C9110 Chronic lymphocytic leukemia of B-cell type not having achieved remission: Secondary | ICD-10-CM

## 2018-06-15 DIAGNOSIS — Z7189 Other specified counseling: Secondary | ICD-10-CM

## 2018-06-15 DIAGNOSIS — E782 Mixed hyperlipidemia: Secondary | ICD-10-CM

## 2018-06-15 DIAGNOSIS — Z952 Presence of prosthetic heart valve: Secondary | ICD-10-CM

## 2018-06-15 NOTE — Telephone Encounter (Signed)
[place] Visit Initial Request  Date of Request ([place]):  June 15, 2018  Requesting Provider:  Richardson Dopp, PA-C     Agency Requested:    [facility] Contact:  Glory Buff, NP [address] [place], [place] [address] Phone #:  [phone] Fax #:  [phone]  Patient Demographic Information: Name:  Nicole Mercado Age:  78 y.o.   DOB:  [birth year]  MRN:  [id number]   Address:   [place]  [place] [address]   Phone Numbers:   Home Phone [phone]  Mobile [phone]     Emergency Contact Information on File:   Contact Information    Name Relation Home Work Mobile   [address] [phone]        The above family members may be contacted for information on this patient (review DPR on file):  Yes    Patient Clinical Information:  Primary Care Provider:  Kristie Cowman, MD  Primary Cardiologist:  Sherren Mocha, MD  Primary Electrophysiologist:  None   Past Medical Hx: Nicole Mercado  has a past medical history of Aortic stenosis, Arthritis, Atrial fibrillation ([place]), CAD (coronary artery disease), Carotid artery occlusion, CLL (chronic lymphocytic leukemia) ([place]), H/O exercise stress test, cardiovascular stress test, echocardiogram, Hypertension, Pancreatitis, and Thrombocytopenia ([place]) (11/19/2010).   Allergies: She has No Known Allergies.   Medications: Current Outpatient Medications on File Prior to Visit  Medication Sig  . acetaminophen (TYLENOL) 325 MG tablet Take 650 mg by mouth every 6 (six) hours as needed for mild pain or headache.   Marland Kitchen amLODipine (NORVASC) 10 MG tablet TAKE 1 TABLET BY MOUTH ONCE DAILY  . amoxicillin (AMOXIL) 500 MG tablet Take 4 tablets by mouth one hour prior to dental procedure  . aspirin 81 MG tablet Take 81 mg by mouth daily.  Marland Kitchen atorvastatin (LIPITOR) 10 MG tablet Take 1 tablet (10 mg total) by mouth daily at 6 PM.  . lisinopril (PRINIVIL,ZESTRIL) 40 MG tablet Take 1  tablet (40 mg total) by mouth daily.  . metoprolol succinate (TOPROL-XL) 50 MG 24 hr tablet TAKE 1 TABLET BY MOUTH ONCE DAILY WITH  OR  IMMEDIATELY  FOLLOWING  A  MEAL  . Multiple Vitamins-Minerals (HM MULTIVITAMIN ADULT GUMMY PO) Chew two gummies daily  . spironolactone (ALDACTONE) 25 MG tablet Take 0.5 tablets (12.5 mg total) by mouth daily.   No current facility-administered medications on file prior to visit.      Social Hx: She  reports that she has never smoked. She has never used smokeless tobacco. She reports that she does not drink alcohol or use drugs.    Diagnosis/Reason for Visit:   Low BP reading on day of visit for telehealth.  Other BPs have been optimal.  She has occasional AM dizziness.  I would like her to have a BP check (check her machine too) and obtain labs.  Services Requested:  Vital Signs (BP, Pulse, O2, Weight)  Physical Exam  Labs:  BMET, CBC  # of Visits Needed/Frequency per Week: 1   A copy of the office note will be faxed with this form.  All labs ordered for this home visit have been released and the request was sent to Chrissie Noa at [facility].

## 2018-06-15 NOTE — Patient Instructions (Addendum)
Medication Instructions:  Take Amlodipine and Metoprolol in the PM and Spironolactone and Lisinopril in the AM.   If you need a refill on your cardiac medications before your next appointment, please call your pharmacy.   Lab work:  A home visit request for BMET, CBC by an Engineer, mining has been requested. You will be contacted by the Nurse.  If you have labs (blood work) drawn today and your tests are completely normal, you will receive your results only by: Marland Kitchen MyChart Message (if you have MyChart) OR . A paper copy in the mail If you have any lab test that is abnormal or we need to change your treatment, we will call you to review the results.  Testing/Procedures: IN [place] Your physician has requested that you have an echocardiogram. Echocardiography is a painless test that uses sound waves to create images of your heart. It provides your doctor with information about the size and shape of your heart and how well your heart's chambers and valves are working. This procedure takes approximately one hour. There are no restrictions for this procedure.     Follow-Up:Dr. Burt Knack in Dec 2020    Any Other Special Instructions Will Be Listed Below (If Applicable). Check BP 1-2 times a day for 1 week and call me with the readings for review.

## 2018-06-17 LAB — CBC
Hematocrit: 34.6 % (ref 34.0–46.6)
Hemoglobin: 12.2 g/dL (ref 11.1–15.9)
MCH: 32.7 pg (ref 26.6–33.0)
MCHC: 35.3 g/dL (ref 31.5–35.7)
MCV: 93 fL (ref 79–97)
Platelets: 92 10*3/uL — CL (ref 150–450)
RBC: 3.73 x10E6/uL — ABNORMAL LOW (ref 3.77–5.28)
RDW: 12 % (ref 11.7–15.4)
WBC: 6.9 10*3/uL (ref 3.4–10.8)

## 2018-06-17 LAB — BASIC METABOLIC PANEL
BUN/Creatinine Ratio: 20 (ref 12–28)
BUN: 25 mg/dL (ref 8–27)
CO2: 22 mmol/L (ref 20–29)
Calcium: 10.2 mg/dL (ref 8.7–10.3)
Chloride: 98 mmol/L (ref 96–106)
Creatinine, Ser: 1.22 mg/dL — ABNORMAL HIGH (ref 0.57–1.00)
GFR calc Af Amer: 49 mL/min/{1.73_m2} — ABNORMAL LOW (ref >59)
GFR calc non Af Amer: 43 mL/min/{1.73_m2} — ABNORMAL LOW (ref >59)
Glucose: 115 mg/dL — ABNORMAL HIGH (ref 65–99)
Potassium: 5.1 mmol/L (ref 3.5–5.2)
Sodium: 135 mmol/L (ref 134–144)

## 2018-06-18 ENCOUNTER — Telehealth: Payer: Self-pay | Admitting: *Deleted

## 2018-06-18 NOTE — Telephone Encounter (Signed)
Patient calling to advise Nicole Mercado her recent platelet count was 92. She is feeling great. No new symptoms. She just returned from a trip that had her in the car for many hours. She has been a little tired on and off but nothing significant. Her PCP wanted her to notify this office of those results.

## 2018-06-18 NOTE — Telephone Encounter (Signed)
I was made aware of the CBC result No change in appt

## 2018-06-29 ENCOUNTER — Telehealth: Payer: Self-pay | Admitting: Cardiovascular Disease

## 2018-06-29 DIAGNOSIS — I10 Essential (primary) hypertension: Secondary | ICD-10-CM

## 2018-06-29 NOTE — Telephone Encounter (Signed)
New Message    Patient is calling in reference to her BP.  Please call to discuss.    Pt c/o BP issue:  1. What are your last 5 BP readings? 105/46, 99/55, 117/56 2. Are you having any other symptoms (ex. Dizziness, headache, blurred vision, passed out)? No symptoms today but felt dizzy a few days ago 3. What is your medication issue? She thinks maybe her BP meds need to be changed

## 2018-06-30 MED ORDER — AMLODIPINE BESYLATE 10 MG PO TABS: 5.0000 mg | ORAL_TABLET | Freq: Every day | ORAL | 3 refills | Status: DC

## 2018-06-30 NOTE — Telephone Encounter (Signed)
F/U Message           Patient is calling in again to discuss her BP, she is asking for a call back from [place].

## 2018-06-30 NOTE — Telephone Encounter (Signed)
Apologized to the patient she was not contacted yesterday as I never received her message.   She states her BP is running a little low in the mornings and she experiences mild dizziness every now and then. Instructed her to DECREASE her amlodipine to 5 mg nightly and to stay well hydrated. She will continue to monitor symptoms and BP and call in a week with an update (or before if she does not feel better). She was grateful for assistance.

## 2018-10-05 ENCOUNTER — Other Ambulatory Visit: Payer: Self-pay | Admitting: Hematology and Oncology

## 2018-10-05 DIAGNOSIS — C9110 Chronic lymphocytic leukemia of B-cell type not having achieved remission: Secondary | ICD-10-CM

## 2018-10-07 ENCOUNTER — Encounter: Payer: Self-pay | Admitting: Hematology and Oncology

## 2018-10-07 ENCOUNTER — Inpatient Hospital Stay: Payer: Medicare Other | Attending: Hematology and Oncology

## 2018-10-07 ENCOUNTER — Inpatient Hospital Stay: Payer: Medicare Other | Admitting: Hematology and Oncology

## 2018-10-07 ENCOUNTER — Other Ambulatory Visit: Payer: Self-pay

## 2018-10-07 ENCOUNTER — Telehealth: Payer: Self-pay | Admitting: Hematology and Oncology

## 2018-10-07 VITALS — BP 189/62 | HR 58 | Temp 98.2°F | Resp 18 | Ht 60.0 in | Wt 103.2 lb

## 2018-10-07 DIAGNOSIS — D696 Thrombocytopenia, unspecified: Secondary | ICD-10-CM | POA: Diagnosis not present

## 2018-10-07 DIAGNOSIS — Z79899 Other long term (current) drug therapy: Secondary | ICD-10-CM | POA: Diagnosis not present

## 2018-10-07 DIAGNOSIS — Z7982 Long term (current) use of aspirin: Secondary | ICD-10-CM | POA: Diagnosis not present

## 2018-10-07 DIAGNOSIS — C9110 Chronic lymphocytic leukemia of B-cell type not having achieved remission: Secondary | ICD-10-CM | POA: Diagnosis not present

## 2018-10-07 LAB — CBC WITH DIFFERENTIAL/PLATELET
Abs Immature Granulocytes: 0 10*3/uL (ref 0.00–0.07)
Basophils Absolute: 0 10*3/uL (ref 0.0–0.1)
Basophils Relative: 1 %
Eosinophils Absolute: 0 10*3/uL (ref 0.0–0.5)
Eosinophils Relative: 1 %
HCT: 35.5 % — ABNORMAL LOW (ref 36.0–46.0)
Hemoglobin: 12.1 g/dL (ref 12.0–15.0)
Immature Granulocytes: 0 %
Lymphocytes Relative: 49 %
Lymphs Abs: 2.6 10*3/uL (ref 0.7–4.0)
MCH: 33 pg (ref 26.0–34.0)
MCHC: 34.1 g/dL (ref 30.0–36.0)
MCV: 96.7 fL (ref 80.0–100.0)
Monocytes Absolute: 0.6 10*3/uL (ref 0.1–1.0)
Monocytes Relative: 12 %
Neutro Abs: 2 10*3/uL (ref 1.7–7.7)
Neutrophils Relative %: 37 %
Platelets: 71 10*3/uL — ABNORMAL LOW (ref 150–400)
RBC: 3.67 MIL/uL — ABNORMAL LOW (ref 3.87–5.11)
RDW: 11.9 % (ref 11.5–15.5)
WBC: 5.3 10*3/uL (ref 4.0–10.5)
nRBC: 0 % (ref 0.0–0.2)

## 2018-10-07 NOTE — Assessment & Plan Note (Signed)
Clinically, she has no detectable signs of recurrence She has no lymphocytosis or anemia but has thrombocytopenia I suspect her thrombocytopenia is immune mediated We discussed plan of care I recommend recheck blood count and CT imaging in 2 months If she have detectable signs of disease recurrence and worsening thrombocytopenia, we will start her back on treatment She agreed with the plan of care

## 2018-10-07 NOTE — Telephone Encounter (Signed)
I talk with patient regarding schedule  

## 2018-10-07 NOTE — Assessment & Plan Note (Signed)
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

## 2018-10-07 NOTE — Progress Notes (Signed)
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     Orders Placed This Encounter  Procedures  . CT ABDOMEN PELVIS W CONTRAST    Standing Status:   Future    Standing Expiration Date:   10/07/2019    Order Specific Question:   If indicated for the ordered procedure, I authorize the administration of contrast media per Radiology protocol    Answer:   Yes    Order Specific Question:   Preferred imaging location?    Answer:   [facility]    Order Specific Question:   Radiology Contrast Protocol - do NOT remove file path    Answer:   [email or web address]  . CT CHEST W CONTRAST    Standing Status:   Future    Standing Expiration Date:   10/07/2019    Order Specific Question:   If indicated for the ordered procedure, I authorize the administration of contrast media per Radiology protocol    Answer:   Yes    Order Specific Question:   Preferred imaging location?    Answer:   [facility]     Order Specific Question:   Radiology Contrast Protocol - do NOT remove file path    Answer:   \\[email or web address]  . Comprehensive metabolic panel    Standing Status:   Future    Standing Expiration Date:   11/11/2019  . CBC with Differential/Platelet    Standing Status:   Future    Standing Expiration Date:   11/11/2019  . Lactate dehydrogenase    Standing Status:   Future    Standing Expiration Date:   10/07/2019  . Uric acid    Standing Status:   Future    Standing Expiration Date:   10/07/2019  . Hepatitis B core antibody, IgM    Standing Status:   Future    Standing Expiration Date:   11/11/2019  . Hepatitis B surface antibody,qualitative    Standing Status:   Future    Standing Expiration Date:   11/11/2019  . Hepatitis B surface antigen    Standing Status:   Future    Standing Expiration Date:   11/11/2019    INTERVAL HISTORY: Please see below for problem oriented charting. She returns for further follow-up She denies recent infection, fever or chills No new lymphadenopathy Appetite is stable The patient denies any recent signs or symptoms of bleeding such as spontaneous epistaxis, hematuria or hematochezia.   SUMMARY OF ONCOLOGIC HISTORY: Oncology History  CLL (chronic lymphocytic leukemia) ([place])  03/21/2011 Initial Diagnosis   CLL (chronic lymphocytic leukemia)   01/14/2014 - 01/31/2014  Hospital Admission   The patient was hospitalized and was found to severe aortic stenosis causing syncopal episode. She received treatment with steroids with minimum success and required platelet transfusion.   09/18/2014 Imaging   CT scan of the chest, abdomen and pelvis show lymphadenopathy but low level burden of disease.   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 There was no stenosis. Mild peri-valvular aortic insufficiency is noted    11/04/2014 -  Chemotherapy   She is started on Ibrutinib   06/17/2016 Imaging   1. No lymphadenopathy noted in the chest, abdomen or pelvis to suggest recurrent disease. 2. Aortic atherosclerosis, in addition to left main and 3 vessel coronary artery disease. Status post median sternotomy for CABG including LIMA to the LAD. 3. 3 mm nonobstructive calculus in the lower pole collecting system of the left kidney. Mild atrophy of the left kidney. 4. Mild colonic diverticulosis, without evidence of acute diverticulitis at this time. 5. Additional incidental findings, as above.     REVIEW OF SYSTEMS:   Constitutional: Denies fevers, chills or abnormal weight loss Eyes: Denies blurriness of vision Ears, nose, mouth, throat, and face: Denies mucositis or sore throat Respiratory: Denies cough, dyspnea or wheezes Cardiovascular: Denies palpitation, chest discomfort or lower extremity swelling Gastrointestinal:  Denies nausea, heartburn or change in bowel habits Skin: Denies abnormal skin rashes Lymphatics: Denies new lymphadenopathy or easy bruising Neurological:Denies numbness, tingling or new weaknesses Behavioral/Psych: Mood is stable, no new changes  All other systems were reviewed with the patient and are negative.  I have reviewed the past medical history, past surgical history, social history and family history with the patient and they are unchanged from previous note.  ALLERGIES:  has No Known Allergies.  MEDICATIONS:  Current Outpatient Medications  Medication Sig Dispense Refill  . acetaminophen (TYLENOL) 325 MG tablet Take 650 mg by mouth every 6 (six) hours as needed for mild pain or headache.     Marland Kitchen amLODipine (NORVASC) 10 MG tablet Take 0.5 tablets (5 mg total) by mouth daily. 45 tablet 3  . amoxicillin (AMOXIL) 500 MG tablet Take 4 tablets by mouth one hour prior to dental procedure 8 tablet 2  . aspirin 81 MG tablet Take 81 mg by mouth daily.    Marland Kitchen atorvastatin (LIPITOR) 10  MG tablet Take 1 tablet (10 mg total) by mouth daily at 6 PM. 90 tablet 3  . lisinopril (PRINIVIL,ZESTRIL) 40 MG tablet Take 1 tablet (40 mg total) by mouth daily. 90 tablet 3  . metoprolol succinate (TOPROL-XL) 50 MG 24 hr tablet TAKE 1 TABLET BY MOUTH ONCE DAILY WITH  OR  IMMEDIATELY  FOLLOWING  A  MEAL 90 tablet 3  . Multiple Vitamins-Minerals (HM MULTIVITAMIN ADULT GUMMY PO) Chew two gummies daily    . spironolactone (ALDACTONE) 25 MG tablet Take 0.5 tablets (12.5 mg total) by mouth daily. 45 tablet 3   No current facility-administered medications for this visit.     PHYSICAL EXAMINATION: ECOG PERFORMANCE STATUS: 0 - Asymptomatic  Vitals:   10/07/18 1021  BP: (!) 189/62  Pulse: (!) 58  Resp: 18  Temp: 98.2 F (36.8 C)  SpO2: 100%   Filed Weights   10/07/18 1021  Weight: 103 lb 3.2 oz (46.8 kg)    GENERAL:alert, no distress and comfortable SKIN: skin color, texture, turgor are normal, no rashes or significant lesions EYES: normal, Conjunctiva  are pink and non-injected, sclera clear OROPHARYNX:no exudate, no erythema and lips, buccal mucosa, and tongue normal  NECK: supple, thyroid normal size, non-tender, without nodularity LYMPH:  no palpable lymphadenopathy in the cervical, axillary or inguinal LUNGS: clear to auscultation and percussion with normal breathing effort HEART: regular rate & rhythm and no murmurs and no lower extremity edema ABDOMEN:abdomen soft, non-tender and normal bowel sounds Musculoskeletal:no cyanosis of digits and no clubbing  NEURO: alert & oriented x 3 with fluent speech, no focal motor/sensory deficits  LABORATORY DATA:  I have reviewed the data as listed    Component Value Date/Time   NA 135 06/16/2018 1145   NA 137 12/18/2016 0916   K 5.1 06/16/2018 1145   K 4.4 12/18/2016 0916   CL 98 06/16/2018 1145   CL 105 05/20/2012 0823   CO2 22 06/16/2018 1145   CO2 25 12/18/2016 0916   GLUCOSE 115 (H) 06/16/2018 1145   GLUCOSE 93 10/05/2017  1145   GLUCOSE 88 12/18/2016 0916   GLUCOSE 70 05/20/2012 0823   BUN 25 06/16/2018 1145   BUN 14.0 12/18/2016 0916   CREATININE 1.22 (H) 06/16/2018 1145   CREATININE 1.0 12/18/2016 0916   CALCIUM 10.2 06/16/2018 1145   CALCIUM 9.9 12/18/2016 0916   PROT 6.2 (L) 10/05/2017 1145   PROT 6.1 (L) 12/18/2016 0916   ALBUMIN 4.1 10/05/2017 1145   ALBUMIN 3.9 12/18/2016 0916   AST 27 10/05/2017 1145   AST 28 12/18/2016 0916   ALT 18 10/05/2017 1145   ALT 16 12/18/2016 0916   ALKPHOS 61 10/05/2017 1145   ALKPHOS 54 12/18/2016 0916   BILITOT 0.9 10/05/2017 1145   BILITOT 1.03 12/18/2016 0916   GFRNONAA 43 (L) 06/16/2018 1145   GFRAA 49 (L) 06/16/2018 1145    No results found for: SPEP, UPEP  Lab Results  Component Value Date   WBC 5.3 10/07/2018   NEUTROABS 2.0 10/07/2018   HGB 12.1 10/07/2018   HCT 35.5 (L) 10/07/2018   MCV 96.7 10/07/2018   PLT 71 (L) 10/07/2018      Chemistry      Component Value Date/Time   NA 135 06/16/2018 1145   NA 137 12/18/2016 0916   K 5.1 06/16/2018 1145   K 4.4 12/18/2016 0916   CL 98 06/16/2018 1145   CL 105 05/20/2012 0823   CO2 22 06/16/2018 1145   CO2 25 12/18/2016 0916   BUN 25 06/16/2018 1145   BUN 14.0 12/18/2016 0916   CREATININE 1.22 (H) 06/16/2018 1145   CREATININE 1.0 12/18/2016 0916      Component Value Date/Time   CALCIUM 10.2 06/16/2018 1145   CALCIUM 9.9 12/18/2016 0916   ALKPHOS 61 10/05/2017 1145   ALKPHOS 54 12/18/2016 0916   AST 27 10/05/2017 1145   AST 28 12/18/2016 0916   ALT 18 10/05/2017 1145   ALT 16 12/18/2016 0916   BILITOT 0.9 10/05/2017 1145   BILITOT 1.03 12/18/2016 0916      All questions were answered. The patient knows to call the clinic with any problems, questions or concerns. No barriers to learning was detected.  I spent 15 minutes counseling the patient face to face. The total time spent in the appointment was 20 minutes and more than 50% was on counseling and review of test results  Heath Lark, MD 10/07/2018 11:27 AM

## 2018-10-14 ENCOUNTER — Telehealth: Payer: Self-pay

## 2018-10-14 NOTE — Telephone Encounter (Signed)
Called to arrange 6 mo [place] and echo in December. Left message to call back.

## 2018-10-19 NOTE — Telephone Encounter (Signed)
Scheduled patient 12/10 for echocardiogram and 12/14 for OV with Dr. Burt Knack. She was grateful for assistance.

## 2018-10-21 ENCOUNTER — Other Ambulatory Visit: Payer: Self-pay | Admitting: Cardiovascular Disease

## 2018-10-21 DIAGNOSIS — I10 Essential (primary) hypertension: Secondary | ICD-10-CM

## 2018-10-26 ENCOUNTER — Other Ambulatory Visit: Payer: Self-pay | Admitting: Cardiovascular Disease

## 2018-12-06 ENCOUNTER — Telehealth: Payer: Self-pay

## 2018-12-06 ENCOUNTER — Encounter (HOSPITAL_COMMUNITY): Payer: Self-pay

## 2018-12-06 ENCOUNTER — Other Ambulatory Visit: Payer: Self-pay

## 2018-12-06 ENCOUNTER — Inpatient Hospital Stay: Payer: Medicare Other | Attending: Hematology and Oncology

## 2018-12-06 ENCOUNTER — Ambulatory Visit (HOSPITAL_COMMUNITY)
Admission: RE | Admit: 2018-12-06 | Discharge: 2018-12-06 | Disposition: A | Discharge status: Home / Self Care | Payer: Medicare Other | Source: Ambulatory Visit | Attending: Hematology and Oncology | Admitting: Hematology and Oncology

## 2018-12-06 DIAGNOSIS — C9110 Chronic lymphocytic leukemia of B-cell type not having achieved remission: Secondary | ICD-10-CM | POA: Diagnosis present

## 2018-12-06 LAB — CBC WITH DIFFERENTIAL/PLATELET
Abs Immature Granulocytes: 0.02 10*3/uL (ref 0.00–0.07)
Basophils Absolute: 0.1 10*3/uL (ref 0.0–0.1)
Basophils Relative: 1 %
Eosinophils Absolute: 0.1 10*3/uL (ref 0.0–0.5)
Eosinophils Relative: 1 %
HCT: 39.6 % (ref 36.0–46.0)
Hemoglobin: 13.4 g/dL (ref 12.0–15.0)
Immature Granulocytes: 0 %
Lymphocytes Relative: 45 %
Lymphs Abs: 3.9 10*3/uL (ref 0.7–4.0)
MCH: 32.8 pg (ref 26.0–34.0)
MCHC: 33.8 g/dL (ref 30.0–36.0)
MCV: 97.1 fL (ref 80.0–100.0)
Monocytes Absolute: 0.7 10*3/uL (ref 0.1–1.0)
Monocytes Relative: 8 %
Neutro Abs: 4 10*3/uL (ref 1.7–7.7)
Neutrophils Relative %: 45 %
Platelets: 76 10*3/uL — ABNORMAL LOW (ref 150–400)
RBC: 4.08 MIL/uL (ref 3.87–5.11)
RDW: 11.9 % (ref 11.5–15.5)
WBC: 8.7 10*3/uL (ref 4.0–10.5)
nRBC: 0 % (ref 0.0–0.2)

## 2018-12-06 LAB — URIC ACID: Uric Acid, Serum: 6.3 mg/dL (ref 2.5–7.1)

## 2018-12-06 LAB — COMPREHENSIVE METABOLIC PANEL
ALT: 24 U/L (ref 0–44)
AST: 38 U/L (ref 15–41)
Albumin: 4.8 g/dL (ref 3.5–5.0)
Alkaline Phosphatase: 59 U/L (ref 38–126)
Anion gap: 12 (ref 5–15)
BUN: 25 mg/dL — ABNORMAL HIGH (ref 8–23)
CO2: 23 mmol/L (ref 22–32)
Calcium: 10.7 mg/dL — ABNORMAL HIGH (ref 8.9–10.3)
Chloride: 101 mmol/L (ref 98–111)
Creatinine, Ser: 1.58 mg/dL — ABNORMAL HIGH (ref 0.44–1.00)
GFR calc Af Amer: 36 mL/min — ABNORMAL LOW (ref >60)
GFR calc non Af Amer: 31 mL/min — ABNORMAL LOW (ref >60)
Glucose, Bld: 101 mg/dL — ABNORMAL HIGH (ref 70–99)
Potassium: 4.4 mmol/L (ref 3.5–5.1)
Sodium: 136 mmol/L (ref 135–145)
Total Bilirubin: 0.9 mg/dL (ref 0.3–1.2)
Total Protein: 7.4 g/dL (ref 6.5–8.1)

## 2018-12-06 LAB — HEPATITIS B CORE ANTIBODY, IGM: Hep B C IgM: NONREACTIVE

## 2018-12-06 LAB — HEPATITIS B SURFACE ANTIGEN: Hepatitis B Surface Ag: NONREACTIVE

## 2018-12-06 LAB — LACTATE DEHYDROGENASE: LDH: 207 U/L — ABNORMAL HIGH (ref 98–192)

## 2018-12-06 LAB — HEPATITIS B SURFACE ANTIBODY,QUALITATIVE: Hep B S Ab: NONREACTIVE

## 2018-12-06 IMAGING — CT CT CHEST W/ CM
3 of 5 series · 14 of 36 positions shown, 17 images · IV contrast (OMNIPAQUE)
Comparison: [DATE].

CLINICAL DATA: CLL.

EXAM:
CT CHEST, ABDOMEN, AND PELVIS WITH CONTRAST
TECHNIQUE: Multidetector CT imaging of the chest, abdomen and pelvis was
performed following the standard protocol during bolus
administration of intravenous contrast.
CONTRAST:  75mL OMNIPAQUE IOHEXOL 300 MG/ML  SOLN

[Series 2: cap with · axial · 0.69mm/px · z∈[-607,-127]mm · 9 of 122 slices shown, 12 images]
[im 13/122  mediastinal]
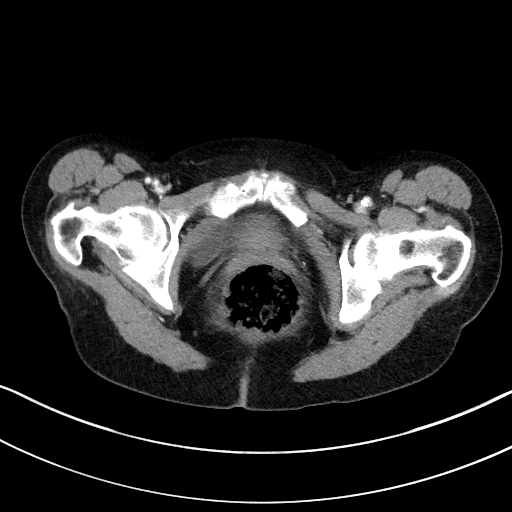
[im 13/122  lung]
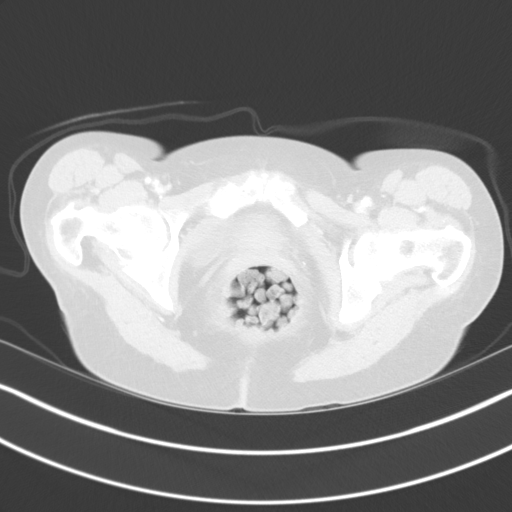
[im 25/122  lung]
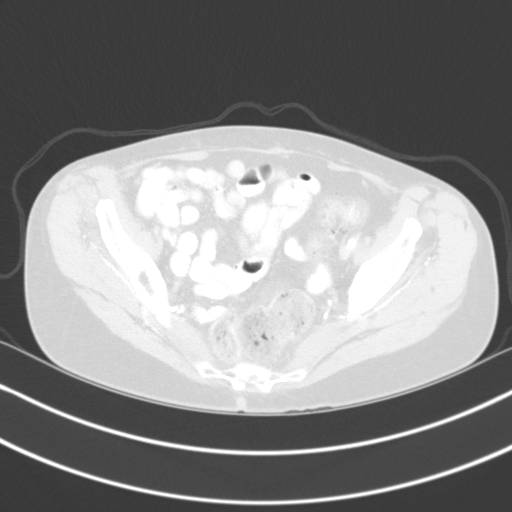
[im 37/122  lung]
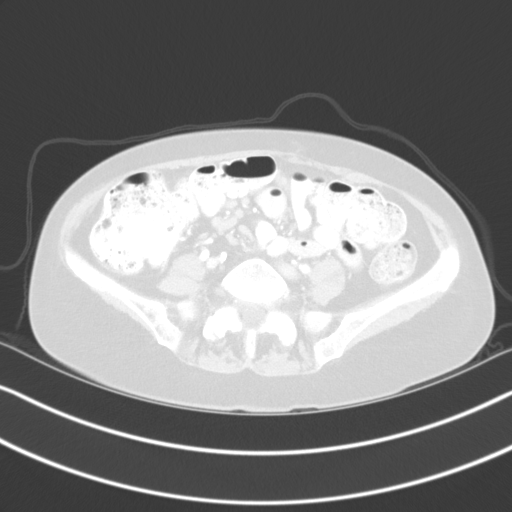
[im 49/122  lung]
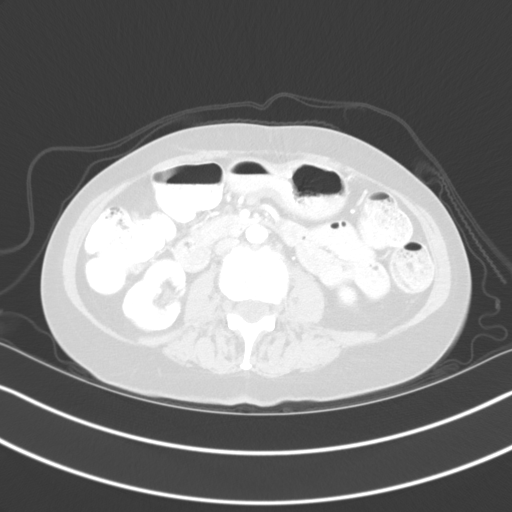
[im 61/122  mediastinal]
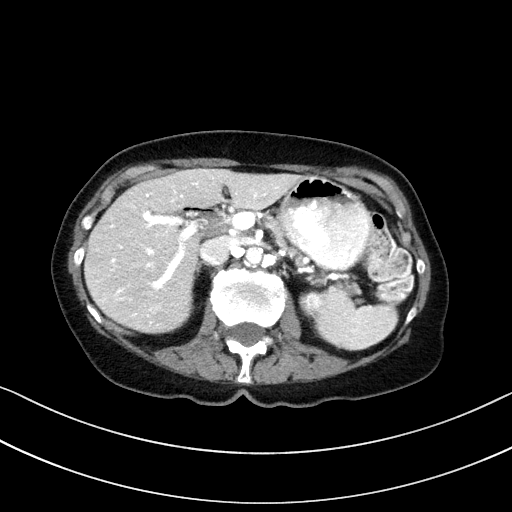
[im 61/122  lung]
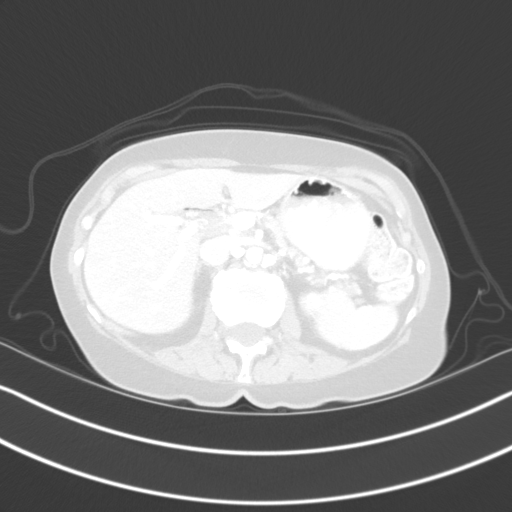
[im 73/122  lung]
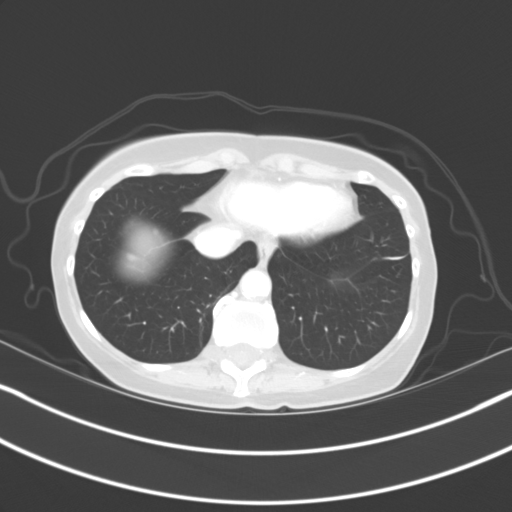
[im 85/122  lung]
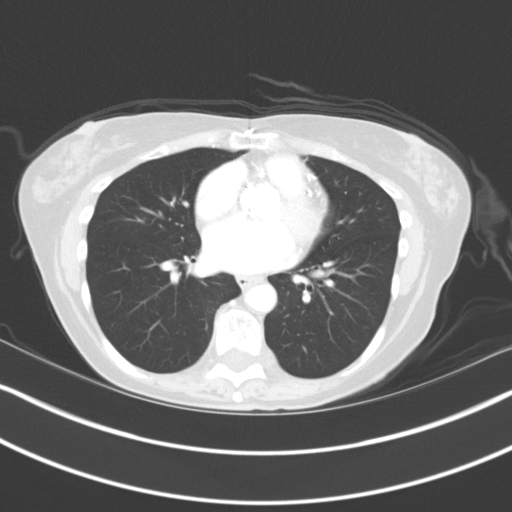
[im 97/122  lung]
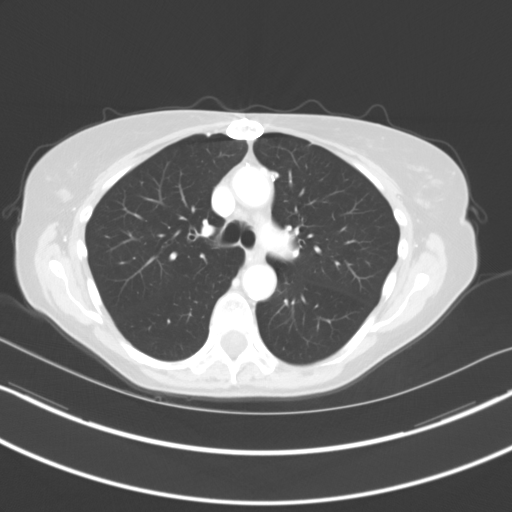
[im 109/122  mediastinal]
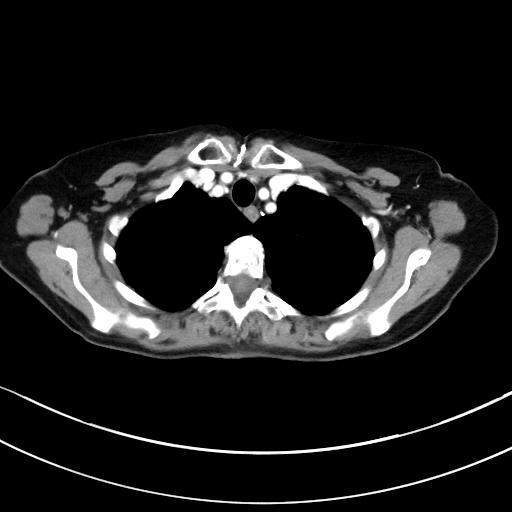
[im 109/122  lung]
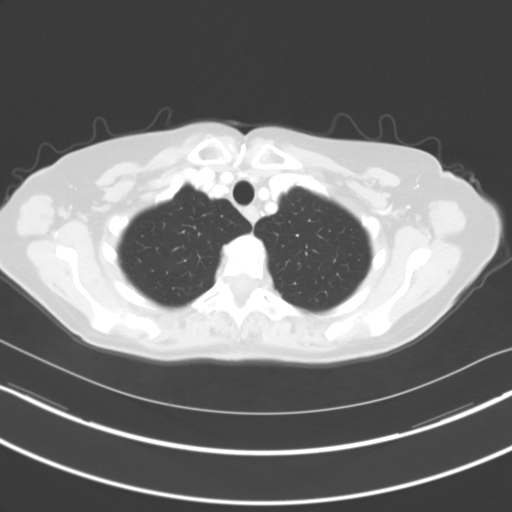

[Series 4: coronals · coronal · 0.86mm/px · 3 of 108 slices shown]
[im 22/108  lung]
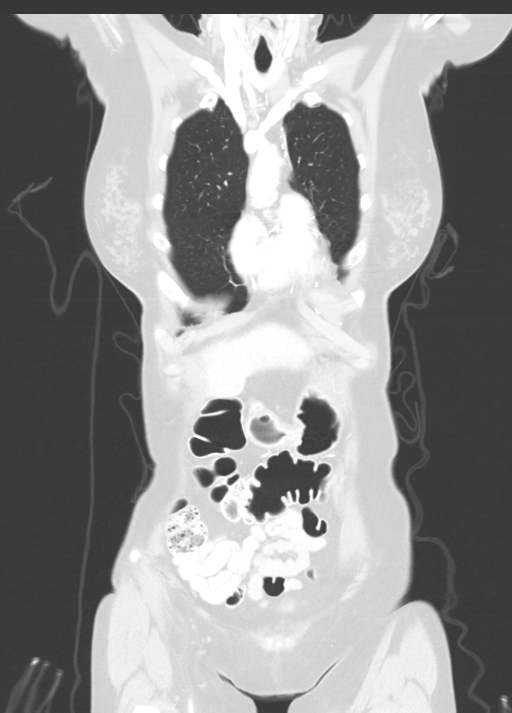
[im 43/108  lung]
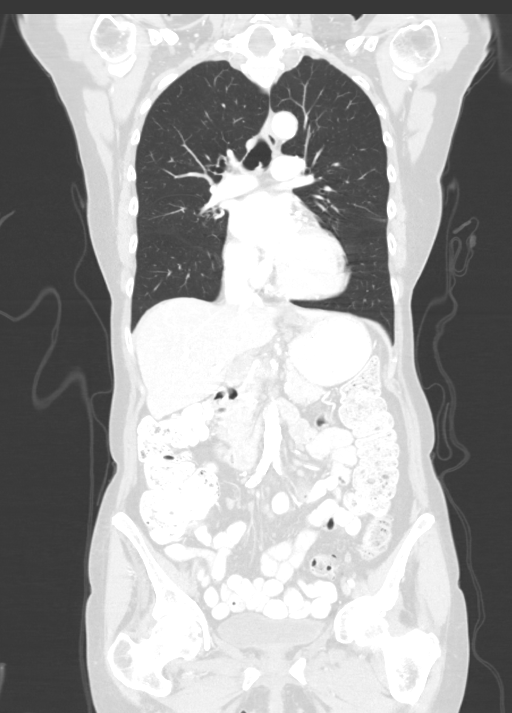
[im 65/108  lung]
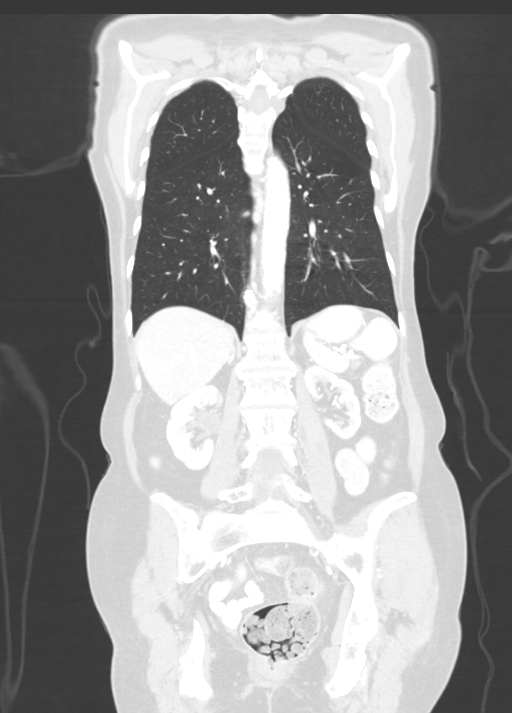

[Series 6: lung · axial · 0.69mm/px · z∈[-350,-306]mm · 2 of 156 slices shown]
[im 12/156  lung]
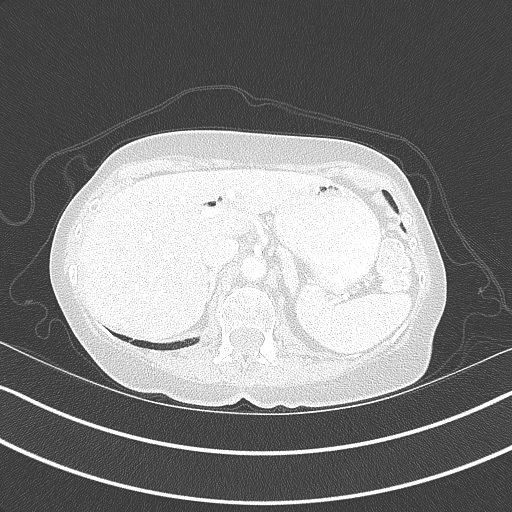
[im 34/156  lung]
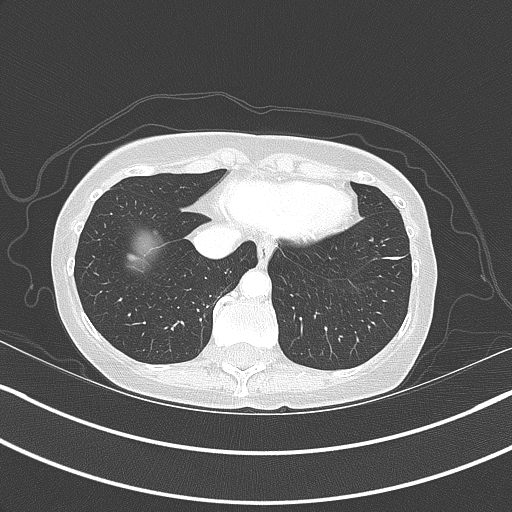

[14 of 36 positions shown; findings below may reference images not displayed]

FINDINGS: CT CHEST FINDINGS

Cardiovascular: Atherosclerotic calcification of the aorta. Aortic
valve replacement. Heart size normal. No pericardial effusion.

Mediastinum/Nodes: No pathologically enlarged mediastinal, hilar or
axillary lymph nodes. Esophagus is grossly unremarkable.

Lungs/Pleura: 3 mm nodule along the lateral minor fissure is
unchanged and likely a subpleural lymph node. Lungs are otherwise
clear. No pleural fluid. Airway is unremarkable.

Musculoskeletal: Degenerative changes in the spine. No worrisome
lytic or sclerotic lesions.

CT ABDOMEN PELVIS FINDINGS

Hepatobiliary: Liver is unremarkable. Pneumobilia as before.
Cholecystectomy. No biliary ductal dilatation.

Pancreas: Negative.

Spleen: Subcentimeter low-attenuation lesion in the spleen is too
small to characterize unchanged.

Adrenals/Urinary Tract: Adrenal glands are unremarkable.
Subcentimeter low-attenuation lesions in the kidneys are too small
to characterize but statistically, cysts are likely. Left kidney is
atrophic. Ureters are decompressed. Bladder is grossly unremarkable.

Stomach/Bowel: Stomach, small bowel and colon are unremarkable.
Appendix is not readily visualized.

Vascular/Lymphatic: Atherosclerotic calcification of the aorta
without aneurysm. Small bowel mesenteric lymph nodes have increased
in size and number. Index 7 mm nodule in the right lower quadrant
(2/86), previously 3 mm. Abdominal retroperitoneal lymph nodes have
increased in size as well, measuring up to 6 mm in the aortocaval
station (2/69), previously 2 mm.

Reproductive: Hysterectomy.  No adnexal mass.

Other: No free fluid. Mesenteries and peritoneum are otherwise
unremarkable.

Musculoskeletal: Degenerative changes in the spine. No worrisome
lytic or sclerotic lesions.
IMPRESSION: 1. Increase in size and number of abdominal retroperitoneal and
small bowel mesenteric lymph nodes, consistent with recurrent CLL.
2.  Aortic atherosclerosis ([24]-170.0).

## 2018-12-06 IMAGING — CT CT ABD-PELV W/ CM
3 of 5 series · 14 of 36 positions shown, 17 images · IV contrast (OMNIPAQUE)
Comparison: [DATE].

CLINICAL DATA: CLL.

EXAM:
CT CHEST, ABDOMEN, AND PELVIS WITH CONTRAST
TECHNIQUE: Multidetector CT imaging of the chest, abdomen and pelvis was
performed following the standard protocol during bolus
administration of intravenous contrast.
CONTRAST:  75mL OMNIPAQUE IOHEXOL 300 MG/ML  SOLN

[Series 2: cap with · axial · 0.69mm/px · z∈[-607,-127]mm · 9 of 122 slices shown, 12 images]
[im 13/122  mediastinal]
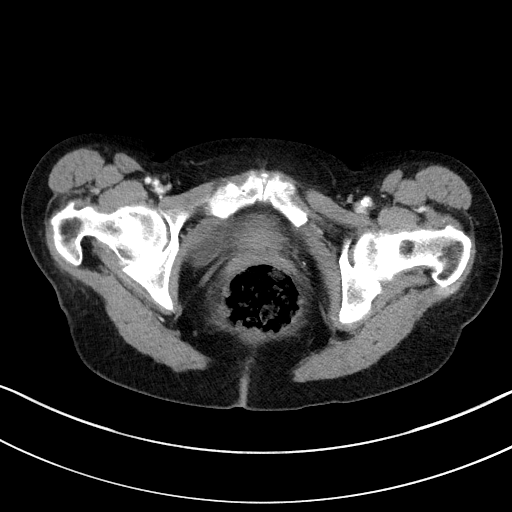
[im 13/122  lung]
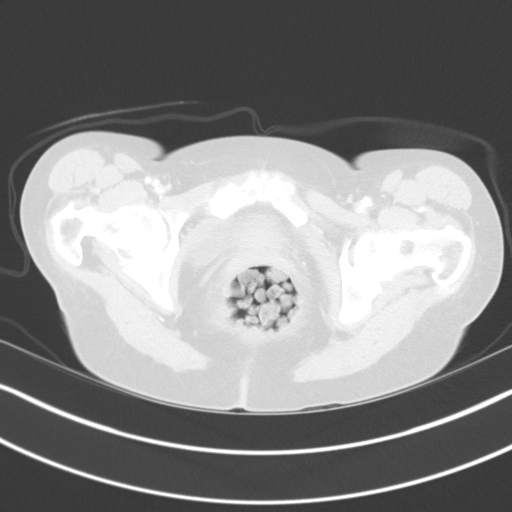
[im 25/122  lung]
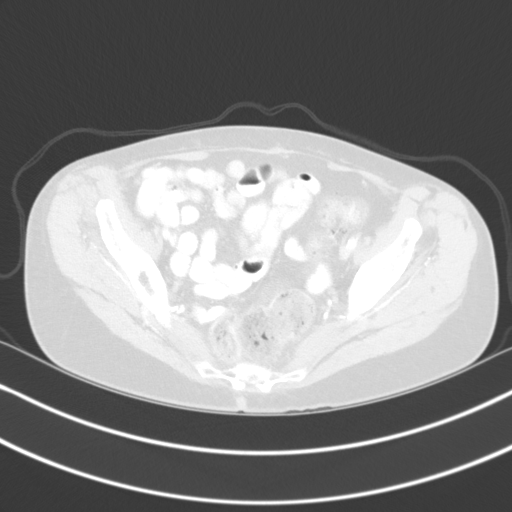
[im 37/122  lung]
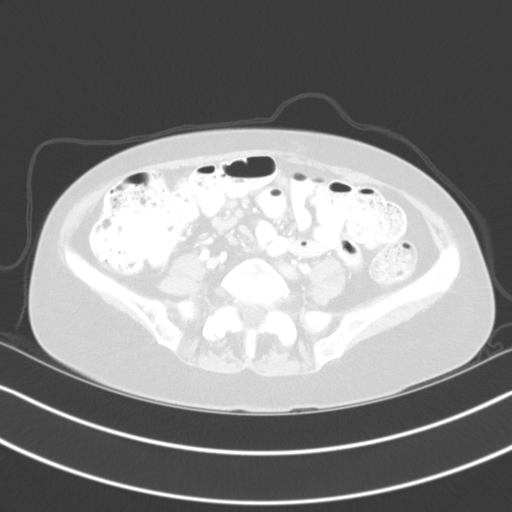
[im 49/122  lung]
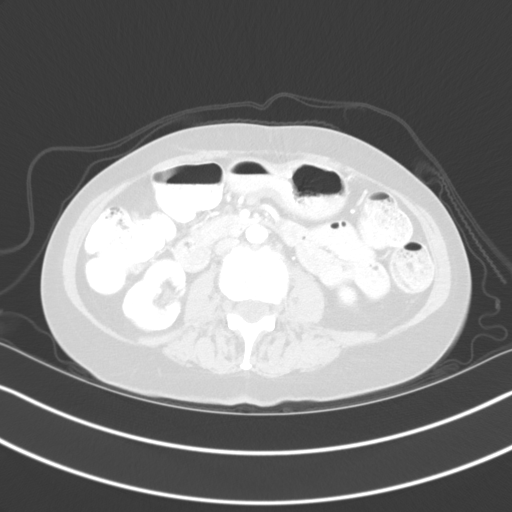
[im 61/122  mediastinal]
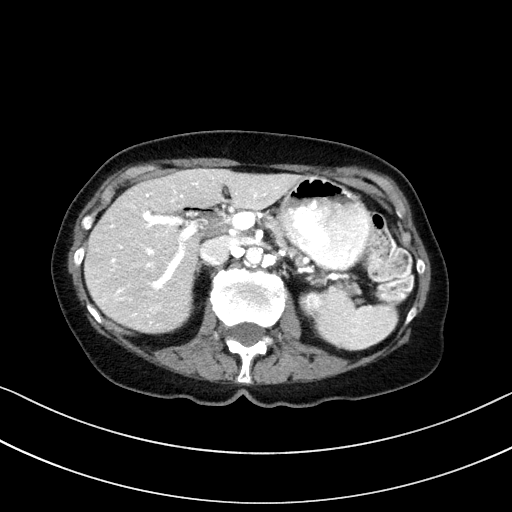
[im 61/122  lung]
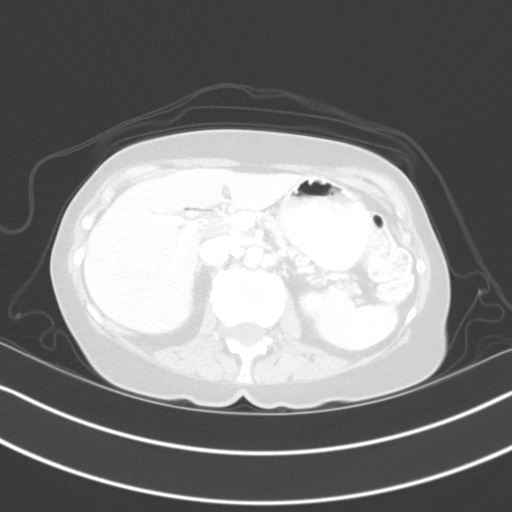
[im 73/122  lung]
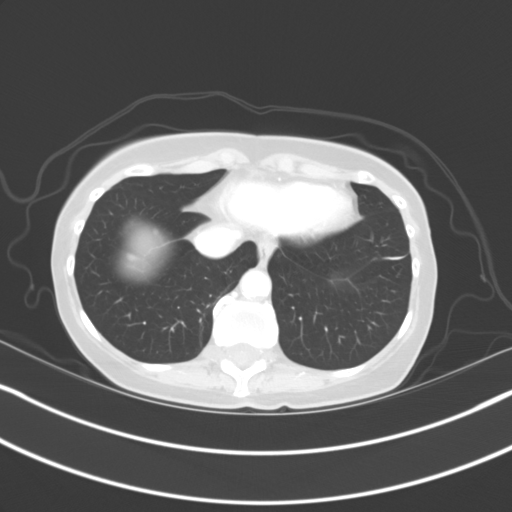
[im 85/122  lung]
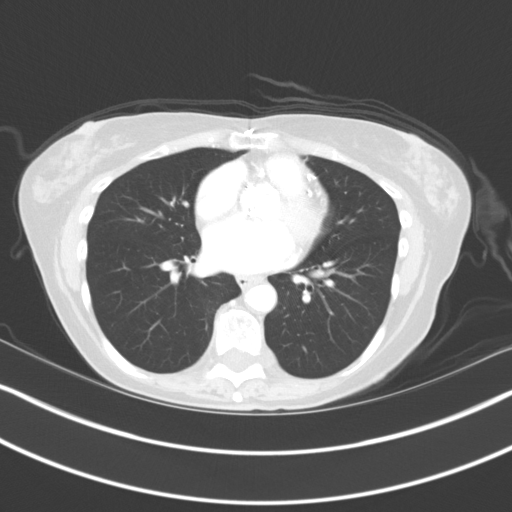
[im 97/122  lung]
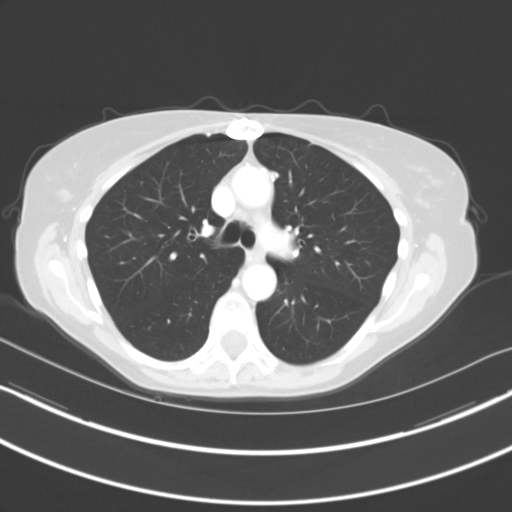
[im 109/122  mediastinal]
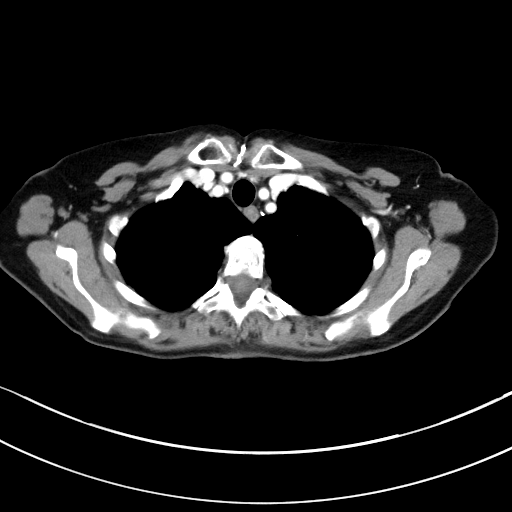
[im 109/122  lung]
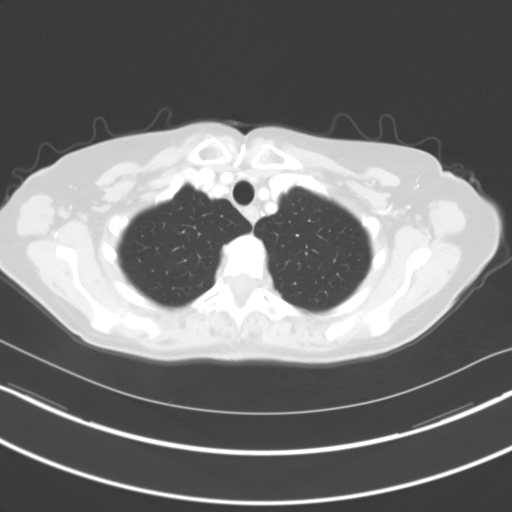

[Series 4: coronals · coronal · 0.86mm/px · 3 of 108 slices shown]
[im 22/108  lung]
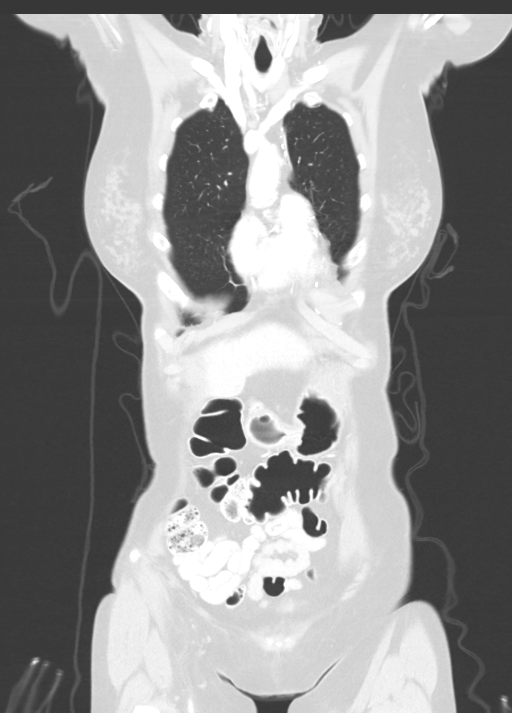
[im 43/108  lung]
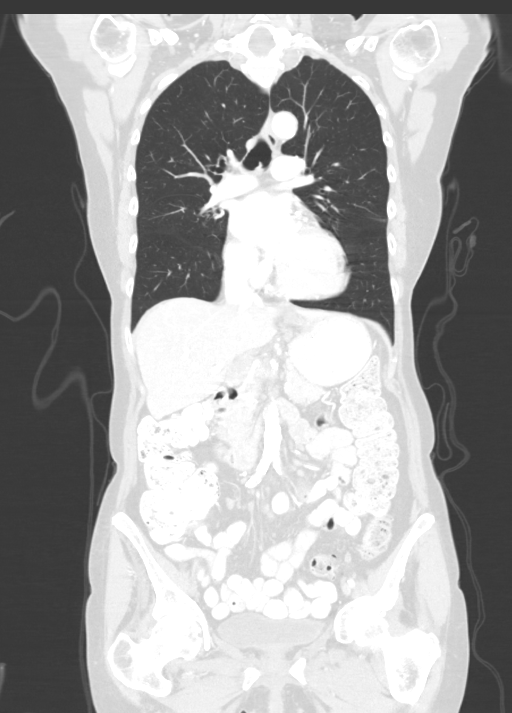
[im 65/108  lung]
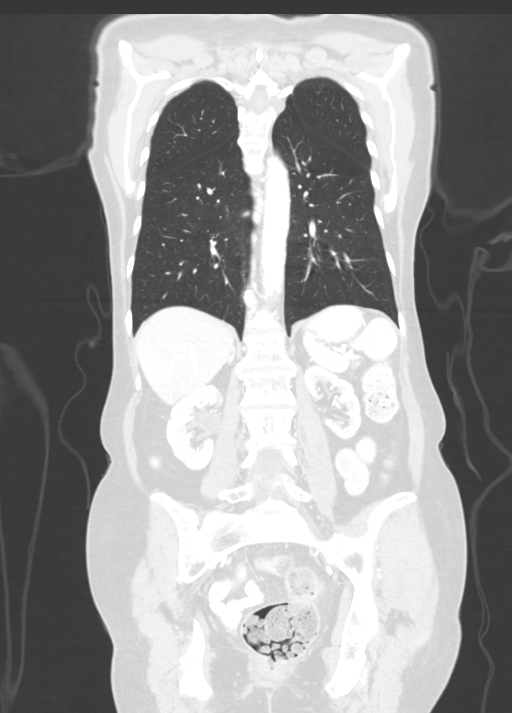

[Series 6: lung · axial · 0.69mm/px · z∈[-350,-306]mm · 2 of 156 slices shown]
[im 12/156  lung]
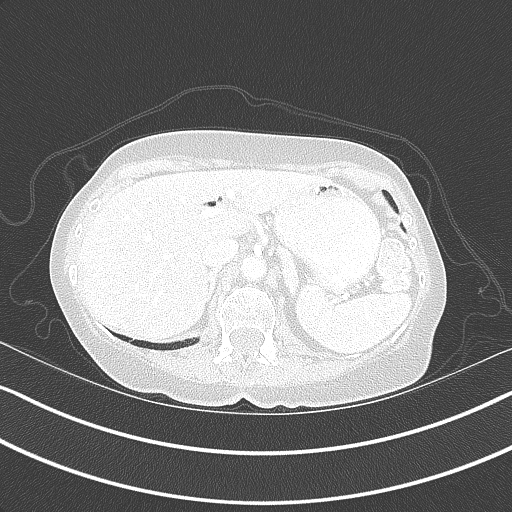
[im 34/156  lung]
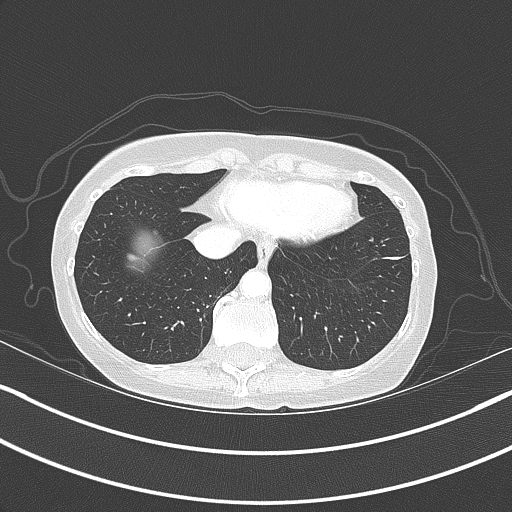

[14 of 36 positions shown; findings below may reference images not displayed]

FINDINGS: CT CHEST FINDINGS

Cardiovascular: Atherosclerotic calcification of the aorta. Aortic
valve replacement. Heart size normal. No pericardial effusion.

Mediastinum/Nodes: No pathologically enlarged mediastinal, hilar or
axillary lymph nodes. Esophagus is grossly unremarkable.

Lungs/Pleura: 3 mm nodule along the lateral minor fissure is
unchanged and likely a subpleural lymph node. Lungs are otherwise
clear. No pleural fluid. Airway is unremarkable.

Musculoskeletal: Degenerative changes in the spine. No worrisome
lytic or sclerotic lesions.

CT ABDOMEN PELVIS FINDINGS

Hepatobiliary: Liver is unremarkable. Pneumobilia as before.
Cholecystectomy. No biliary ductal dilatation.

Pancreas: Negative.

Spleen: Subcentimeter low-attenuation lesion in the spleen is too
small to characterize unchanged.

Adrenals/Urinary Tract: Adrenal glands are unremarkable.
Subcentimeter low-attenuation lesions in the kidneys are too small
to characterize but statistically, cysts are likely. Left kidney is
atrophic. Ureters are decompressed. Bladder is grossly unremarkable.

Stomach/Bowel: Stomach, small bowel and colon are unremarkable.
Appendix is not readily visualized.

Vascular/Lymphatic: Atherosclerotic calcification of the aorta
without aneurysm. Small bowel mesenteric lymph nodes have increased
in size and number. Index 7 mm nodule in the right lower quadrant
(2/86), previously 3 mm. Abdominal retroperitoneal lymph nodes have
increased in size as well, measuring up to 6 mm in the aortocaval
station (2/69), previously 2 mm.

Reproductive: Hysterectomy.  No adnexal mass.

Other: No free fluid. Mesenteries and peritoneum are otherwise
unremarkable.

Musculoskeletal: Degenerative changes in the spine. No worrisome
lytic or sclerotic lesions.
IMPRESSION: 1. Increase in size and number of abdominal retroperitoneal and
small bowel mesenteric lymph nodes, consistent with recurrent CLL.
2.  Aortic atherosclerosis ([24]-170.0).

## 2018-12-06 MED ORDER — IOHEXOL 300 MG/ML  SOLN
75.0000 mL | Freq: Once | INTRAMUSCULAR | Status: AC | PRN
  Administered 2018-12-06: 75 mL via INTRAVENOUS

## 2018-12-06 MED ORDER — SODIUM CHLORIDE (PF) 0.9 % IJ SOLN
INTRAMUSCULAR | Status: AC
  Filled 2018-12-06: qty 50

## 2018-12-06 NOTE — Telephone Encounter (Signed)
-----   Message from Heath Lark, MD sent at 12/06/2018 11:17 AM EST ----- Regarding: appt tomorrow She can bring a family tomorrow or if she has good internet access we can do virtual visit tomorrow same time

## 2018-12-06 NOTE — Telephone Encounter (Signed)
Called and given below message. She verbalized understanding. She would like to come to the office and bring her boyfriend.

## 2018-12-07 ENCOUNTER — Inpatient Hospital Stay: Payer: Medicare Other | Attending: Hematology and Oncology | Admitting: Hematology and Oncology

## 2018-12-07 ENCOUNTER — Other Ambulatory Visit: Payer: Self-pay

## 2018-12-07 DIAGNOSIS — Z79899 Other long term (current) drug therapy: Secondary | ICD-10-CM | POA: Diagnosis not present

## 2018-12-07 DIAGNOSIS — C9110 Chronic lymphocytic leukemia of B-cell type not having achieved remission: Secondary | ICD-10-CM | POA: Diagnosis not present

## 2018-12-07 DIAGNOSIS — Z7982 Long term (current) use of aspirin: Secondary | ICD-10-CM | POA: Diagnosis not present

## 2018-12-07 DIAGNOSIS — C9112 Chronic lymphocytic leukemia of B-cell type in relapse: Secondary | ICD-10-CM | POA: Diagnosis not present

## 2018-12-07 DIAGNOSIS — Z9221 Personal history of antineoplastic chemotherapy: Secondary | ICD-10-CM | POA: Insufficient documentation

## 2018-12-07 DIAGNOSIS — D696 Thrombocytopenia, unspecified: Secondary | ICD-10-CM | POA: Diagnosis not present

## 2018-12-07 DIAGNOSIS — Z7189 Other specified counseling: Secondary | ICD-10-CM

## 2018-12-07 NOTE — Patient Instructions (Signed)
Acalabrutinib capsules What is this medicine? ACALABRUTINIB (a kal a broo ti nib) is a medicine that targets proteins in cancer cells and stops the cancer cells from growing. It is used to treat mantle cell lymphoma, chronic lymphocytic leukemia, and small lymphocytic lymphoma. This medicine may be used for other purposes; ask your health care provider or pharmacist if you have questions. COMMON BRAND NAME(S): CALQUENCE What should I tell my health care provider before I take this medicine? They need to know if you have any of these conditions:  bleeding disorders  high blood pressure  history of irregular heartbeat  infection including hepatitis B virus infection  liver disease  recent surgery  take medicines that treat or prevent blood clots  an unusual or allergic reaction to acalabrutinib, other medicines, foods, dyes, or preservatives  pregnant or trying to get pregnant  breast-feeding How should I use this medicine? Take this medicine by mouth with a glass of water. Follow the directions on the prescription label. You can take it with or without food. If it upsets your stomach, take it with food. Do not cut, crush or chew this medicine. Do not take with grapefruit juice. Avoid taking H2-blockers and antacids within 2 hours of taking this medicine. Take your medicine at regular intervals. Do not take it more often than directed. Do not stop taking except on your doctor's advice. Talk to your pediatrician regarding the use of this medicine in children. Special care may be needed. Overdosage: If you think you have taken too much of this medicine contact a poison control center or emergency room at once. NOTE: This medicine is only for you. Do not share this medicine with others. What if I miss a dose? If you miss a dose, take it as soon as you can. If your next dose is to be taken in less than 9 hours, then do not take the missed dose. Take the next dose at your regular time. Do  not take double or extra doses. What may interact with this medicine? This medicine may interact with the following medications:  antiviral medications for HIV or AIDS  aprepitant  boceprevir  calcium channel blockers like diltiazem and verapamil  certain antibiotics like clarithromycin, erythromycin, and troleandomycin  certain medicines for fungal infections like fluconazole, ketoconazole, itraconazole, posaconazole, and voriconazole  certain medicines for seizures like carbamazepine and phenytoin  certain medicines for stomach problems like cimetidine, famotidine, omeprazole, lansoprazole  ciprofloxacin  clotrimazole  conivaptan  crizotinib  cyclosporine  dronedarone  enzalutamide  fluvoxamine  grapefruit juice  idelalisib  imatinib  methotrexate  mitotane  nefazodone  rifampin  St. John's wort This list may not describe all possible interactions. Give your health care provider a list of all the medicines, herbs, non-prescription drugs, or dietary supplements you use. Also tell them if you smoke, drink alcohol, or use illegal drugs. Some items may interact with your medicine. What should I watch for while using this medicine? You may need blood work done while you are taking this medicine. This medicine may increase your risk to bruise or bleed. Call your doctor or health care professional if you notice any unusual bleeding. Call your doctor or health care professional for advice if you get a fever, chills or sore throat, or other symptoms of a cold or flu. Do not treat yourself. This drug decreases your body's ability to fight infections. Try to avoid being around people who are sick. If you are going to have surgery or any  other procedures, tell your doctor you are taking this medicine. Tell your dentist and dental surgeon that you are taking this medicine. You should not have major dental surgery while on this medicine. See your dentist to have a dental  exam and fix any dental problems before starting this medicine. Talk to your doctor about your risk of cancer. You may be more at risk for certain types of cancers if you take this medicine. Keep out of the sun. If you cannot avoid being in the sun, wear protective clothing and use sunscreen. Do not become pregnant while taking this medicine or for at least 1 week after stopping it. Women should inform their doctor if they wish to become pregnant or think they might be pregnant. There is a potential for serious side effects to an unborn child. Talk to your health care professional or pharmacist for more information. Do not breast-feed an infant while taking this medicine or for 2 weeks after stopping it. What side effects may I notice from receiving this medicine? Side effects that you should report to your doctor or health care professional as soon as possible:  allergic reactions like skin rash, itching or hives, swelling of the face, lips, or tongue  low blood counts - this medicine may decrease the number of white blood cells, red blood cells and platelets. You may be at increased risk for infections and bleeding.  signs of decreased red blood cells - unusually weak or tired, feeling faint or lightheaded, falls  signs or symptoms of bleeding such as bloody or black, tarry stools; red or dark-brown urine; spitting up blood or brown material that looks like coffee grounds; red spots on the skin; unusual bruising or bleeding from the eye, gums, or nose; confusion; trouble speaking or understanding; severe headaches; weakness; or dizziness  signs and symptoms of a dangerous change in heartbeat or heart rhythm like chest pain; dizziness; fast or irregular heartbeat; palpitations; feeling faint or lightheaded, falls; breathing problems  signs of infection - fever or chills, cough, sore throat, pain or difficulty passing urine Side effects that usually do not require medical attention (report these to  your doctor or health care professional if they continue or are bothersome):  constipation  diarrhea  headache  muscle aches  stomach pain  tiredness This list may not describe all possible side effects. Call your doctor for medical advice about side effects. You may report side effects to FDA at 1-800-FDA-1088. Where should I keep my medicine? Keep out of the reach of children. Store between 20 and 25 degrees C (68 and 77 degrees F). Throw away any unused medicine after the expiration date. NOTE: This sheet is a summary. It may not cover all possible information. If you have questions about this medicine, talk to your doctor, pharmacist, or health care provider.  2020 Elsevier/Gold Standard (2017-11-27 17:27:50)

## 2018-12-08 ENCOUNTER — Telehealth: Payer: Self-pay | Admitting: Pharmacy Technician

## 2018-12-08 ENCOUNTER — Telehealth: Payer: Self-pay | Admitting: Pharmacist

## 2018-12-08 ENCOUNTER — Telehealth: Payer: Self-pay | Admitting: Cardiovascular Disease

## 2018-12-08 ENCOUNTER — Encounter: Payer: Self-pay | Admitting: Hematology and Oncology

## 2018-12-08 ENCOUNTER — Telehealth: Payer: Self-pay | Admitting: *Deleted

## 2018-12-08 DIAGNOSIS — Z7189 Other specified counseling: Secondary | ICD-10-CM | POA: Insufficient documentation

## 2018-12-08 MED ORDER — ACALABRUTINIB 100 MG PO CAPS: 100.0000 mg | ORAL_CAPSULE | Freq: Two times a day (BID) | ORAL | 11 refills | Status: DC

## 2018-12-08 NOTE — Assessment & Plan Note (Signed)
We discussed the incurable nature of CLL She would likely need to stay on treatment long-term if she have positive response to therapy and tolerated that well

## 2018-12-08 NOTE — Telephone Encounter (Signed)
-----   Message from Darl Pikes, [facility] sent at 12/08/2018  9:34 AM EST ----- Regarding: RE: acalabrutinib Thanks for letting me know! We will get to work on this and keep you updated!  -Alyson  ----- Message ----- From: Heath Lark, MD Sent: 12/08/2018   9:16 AM EST To: Enis Gash, RPH, Belva Chimes, RN, # Subject: Alisa Graff and Alyson,  I just signed her note Please help with insurance prior auth Plan start date: 12/28  Hi Clarise Cruz,  Can you call her cardiology office to also add an EKG? When they see her? Thanks

## 2018-12-08 NOTE — Telephone Encounter (Signed)
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  CBC from 12/06/2018 assessed, no relevant lab abnormalities. Prescription dose and frequency assessed.   Current medication list in Epic reviewed, no relevant DDIs with acalabrutinib identified.  Prescription has been e-scribed to the [facility] for benefits analysis and approval.  Oral Oncology Clinic will continue to follow for insurance authorization, copayment issues, initial counseling and start date.  Darl Pikes, PharmD, BCPS, [facility] Hematology/Oncology Clinical Pharmacist [facility] [place] [facility] [phone]  12/08/2018 9:37 AM

## 2018-12-08 NOTE — Telephone Encounter (Signed)
Called to request EKG at Echo- nurse is not available on this day to have EKG completed. Request submitted to have patient scheduled for EKG.

## 2018-12-08 NOTE — Assessment & Plan Note (Signed)
Unfortunately, CT imaging showed disease relapse She is getting more thrombocytopenic even though physically, she is not symptomatic with bleeding We discussed the risk and benefits of various treatment options  Acalabrutinib (ACP-196) in Relapsed Chronic Lymphocytic Leukemia by Patton Salles al  This article was published on December 12, 2013, at [email or web address] N Engl J Med [phone]. DOI: 10.1056/NEJMoa1509981   BACKGROUND  Irreversible inhibition of Bruton's tyrosine kinase (BTK) by ibrutinib represents an important therapeutic advance for the treatment of chronic lymphocytic leukemia (CLL). However, ibrutinib also irreversibly inhibits alternative kinase targets, which potentially compromises its therapeutic index. Acalabrutinib (ACP-196) is a more selective, irreversible BTK inhibitor that is specifically designed to improve on the safety and efficacy of first-generation BTK inhibitors.  METHODS  In this uncontrolled, phase 1-2, multicenter study, we administered oral acalabrutinib to 61 patients who had relapsed CLL to assess the safety, efficacy, pharmacokinetics, and pharmacodynamics of acalabrutinib. Patients were treated with acalabrutinib at a dose of 100 to 400 mg once daily in the dose-escalation (phase 1) portion of the study and 100 mg twice daily in the expansion (phase 2) portion.  RESULTS  The median age of the patients was 41 years, and patients had received a median of three previous therapies for CLL; 31% had chromosome 17p13.1 deletion, and 75% had unmutated immunoglobulin heavy-chain variable genes. No dose-limiting toxic effects occurred during the dose-escalation portion of the study. The most common adverse events observed were headache (in 43% of the patients), diarrhea (in 39%), and increased weight (in 26%). Most adverse events were of grade 1 or 2. At a median followup of 14.3 months, the overall response rate was 95%, including 85% with a partial response and 10% with a  partial response with lymphocytosis; the remaining 5% of patients had stable disease. Among patients with chromosome 17p13.1 deletion, the overall response rate was 100%. No cases of Richter's transformation (CLL that has evolved into large-cell lymphoma) and only one case of CLL progression have occurred.  CONCLUSIONS  In this study, the selective BTK inhibitor acalabrutinib had promising safety and efficacy profiles in patients with relapsed CLL, including those with chromosome 17p13.1 deletion. (Funded by the [employer] and others; Midwife.gov number, LQ:3618470.)  We discussed some of the risks, benefits and side-effects of Acalabrutinib Treatment intent is palliative  Some of the short term side-effects included, though not limited to, risk of fatigue, weight loss, tumor lysis syndrome, risk of allergic reactions, pancytopenia, life-threatening infections, need for transfusions of blood products, nausea, vomiting, change in bowel habits, admission to hospital for various reasons, and risks of death.   Long term side-effects are also discussed including permanent damage to nerve function, chronic fatigue, and rare secondary malignancy including bone marrow disorders.   The patient is aware that the response rates discussed earlier is not guaranteed.    After a long discussion, patient made an informed decision to proceed with the prescribed plan of care.   Patient education material was dispensed The patient would like treatment to start after Christmas which I think is reasonable I will get help from pharmacist for insurance prior authorization She has cardiology visit and echocardiogram schedule in a few weeks I will see if they can order EKG as well I will see her first week of January for toxicity review

## 2018-12-08 NOTE — Telephone Encounter (Signed)
Oral Oncology Patient Advocate Encounter  Prior Authorization for Calquence has been approved.    PA# [id number] Effective dates: 12/08/2018 through 01/06/2020  Patients co-pay is $2728.44.  Will sign patient up for copay assistance through grant foundations.  Oral Oncology Clinic will continue to follow.   [name] Phone [phone] Fax [phone] 12/08/2018 10:38 AM

## 2018-12-08 NOTE — Telephone Encounter (Signed)
Oral Oncology Patient Advocate Encounter  Was successful in securing patient a $8,000 grant from [facility] to provide copayment coverage for Calquence.  This will keep the out of pocket expense at $0.     [employer] ID: [id number]  I have spoken with the patient.   The billing information is as follows and has been shared with with [place].    RxBin: [id number] PCN: [id number] Member ID: [id number] Group ID: [id number] Dates of Eligibility: 11/08/2018 through 11/07/2019  Audrie Lia:  [place] Patient [place] Phone [phone] Fax [phone] 12/08/2018 11:47 AM   [place] Nancy [place] Patient [place] Phone [phone] Fax [phone] 12/08/2018 11:42 AM

## 2018-12-08 NOTE — Telephone Encounter (Signed)
Judson Roch from the [facility] is calling in regards to wanting the patient scheduled for an EKG. Please advise.

## 2018-12-08 NOTE — Progress Notes (Signed)
[place] OFFICE PROGRESS NOTE  Patient Care Team: Kristie Cowman, MD as PCP - General (Family Medicine) Sherren Mocha, MD as PCP - Cardiology (Cardiology) Kristie Cowman, MD (Family Medicine) Terance Ice, MD (Inactive) (Cardiology)  ASSESSMENT & PLAN:  CLL (chronic lymphocytic leukemia) Unfortunately, CT imaging showed disease relapse She is getting more thrombocytopenic even though physically, she is not symptomatic with bleeding We discussed the risk and benefits of various treatment options  Acalabrutinib (ACP-196) in Relapsed Chronic Lymphocytic Leukemia by Patton Salles al  This article was published on December 12, 2013, at [email or web address] N Engl J Med [phone]. DOI: 10.1056/NEJMoa1509981   BACKGROUND  Irreversible inhibition of Bruton's tyrosine kinase (BTK) by ibrutinib represents an important therapeutic advance for the treatment of chronic lymphocytic leukemia (CLL). However, ibrutinib also irreversibly inhibits alternative kinase targets, which potentially compromises its therapeutic index. Acalabrutinib (ACP-196) is a more selective, irreversible BTK inhibitor that is specifically designed to improve on the safety and efficacy of first-generation BTK inhibitors.  METHODS  In this uncontrolled, phase 1-2, multicenter study, we administered oral acalabrutinib to 61 patients who had relapsed CLL to assess the safety, efficacy, pharmacokinetics, and pharmacodynamics of acalabrutinib. Patients were treated with acalabrutinib at a dose of 100 to 400 mg once daily in the dose-escalation (phase 1) portion of the study and 100 mg twice daily in the expansion (phase 2) portion.  RESULTS  The median age of the patients was 67 years, and patients had received a median of three previous therapies for CLL; 31% had chromosome 17p13.1 deletion, and 75% had unmutated immunoglobulin heavy-chain variable genes. No dose-limiting toxic effects occurred during the dose-escalation  portion of the study. The most common adverse events observed were headache (in 43% of the patients), diarrhea (in 39%), and increased weight (in 26%). Most adverse events were of grade 1 or 2. At a median followup of 14.3 months, the overall response rate was 95%, including 85% with a partial response and 10% with a partial response with lymphocytosis; the remaining 5% of patients had stable disease. Among patients with chromosome 17p13.1 deletion, the overall response rate was 100%. No cases of Richter's transformation (CLL that has evolved into large-cell lymphoma) and only one case of CLL progression have occurred.  CONCLUSIONS  In this study, the selective BTK inhibitor acalabrutinib had promising safety and efficacy profiles in patients with relapsed CLL, including those with chromosome 17p13.1 deletion. (Funded by the [employer] and others; Midwife.gov number, LQ:3618470.)  We discussed some of the risks, benefits and side-effects of Acalabrutinib Treatment intent is palliative  Some of the short term side-effects included, though not limited to, risk of fatigue, weight loss, tumor lysis syndrome, risk of allergic reactions, pancytopenia, life-threatening infections, need for transfusions of blood products, nausea, vomiting, change in bowel habits, admission to hospital for various reasons, and risks of death.   Long term side-effects are also discussed including permanent damage to nerve function, chronic fatigue, and rare secondary malignancy including bone marrow disorders.   The patient is aware that the response rates discussed earlier is not guaranteed.    After a long discussion, patient made an informed decision to proceed with the prescribed plan of care.   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    Goals of care, counseling/discussion We discussed the incurable nature of CLL She would likely need to stay on treatment long-term if she have positive response to therapy and tolerated that well   No orders of the defined types were placed in this encounter.   INTERVAL HISTORY: Please see below for problem oriented charting. She returns with her boyfriend for further follow-up She is not symptomatic from thrombocytopenia The patient denies any recent signs or symptoms of bleeding such as spontaneous epistaxis, hematuria or hematochezia. She denies abdominal discomfort or changes in bowel habits  SUMMARY OF ONCOLOGIC HISTORY: Oncology History  CLL (chronic lymphocytic leukemia) ([place])  03/21/2011 Initial Diagnosis   CLL (chronic lymphocytic leukemia)   01/14/2014 - 01/31/2014 Hospital Admission   The patient was hospitalized and was found to severe aortic stenosis causing syncopal episode. She received treatment with steroids with minimum success and required platelet transfusion.   09/18/2014 Imaging   CT scan of the chest, abdomen and pelvis show lymphadenopathy but low level burden of disease.   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 There was no stenosis. Mild peri-valvular aortic insufficiency is noted   11/04/2014 -  Chemotherapy   She is started on Ibrutinib   06/17/2016 Imaging   1. No lymphadenopathy noted in the chest, abdomen or pelvis to suggest recurrent disease. 2. Aortic  atherosclerosis, in addition to left main and 3 vessel coronary artery disease. Status post median sternotomy for CABG including LIMA to the LAD. 3. 3 mm nonobstructive calculus in the lower pole collecting system of the left kidney. Mild atrophy of the left kidney. 4. Mild colonic diverticulosis, without evidence of acute diverticulitis at this time. 5. Additional incidental findings, as above.   12/06/2018 Imaging   1. Increase in size and number of abdominal retroperitoneal and small bowel mesenteric lymph nodes, consistent with recurrent CLL. 2.  Aortic atherosclerosis (ICD10-170.0).   12/07/2018 Cancer Staging   Staging form: Chronic Lymphocytic Leukemia / Small Lymphocytic Lymphoma, AJCC 8th Edition - Clinical stage from 12/07/2018: Modified Rai Stage IV (Modified Rai risk: High, Binet: Stage B, Lugano: Stage II, Lymphocytosis: Absent, Adenopathy: Present, Organomegaly: Absent, Anemia: Absent, Thrombocytopenia: Present) - Signed by Heath Lark, MD on 12/07/2018     REVIEW OF SYSTEMS:   Constitutional: Denies fevers, chills or abnormal weight loss Eyes: Denies blurriness of vision Ears, nose, mouth, throat, and face: Denies mucositis or sore throat Respiratory: Denies cough, dyspnea or wheezes Cardiovascular: Denies palpitation, chest discomfort or lower extremity swelling Gastrointestinal:  Denies nausea, heartburn or change in bowel habits Skin: Denies abnormal skin rashes Lymphatics: Denies new lymphadenopathy or easy bruising Neurological:Denies numbness, tingling or new weaknesses Behavioral/Psych: Mood is stable, no new changes  All other systems were reviewed with the patient and are negative.  I have reviewed the past medical history, past surgical history, social history and family history with the patient and they are unchanged from previous note.  ALLERGIES:  has No Known Allergies.  MEDICATIONS:  Current Outpatient Medications  Medication Sig Dispense Refill  .  acalabrutinib (CALQUENCE) 100 MG capsule Take 1 capsule (100 mg total) by mouth  2 (two) times daily. 60 capsule 11  . acetaminophen (TYLENOL) 325 MG tablet Take 650 mg by mouth every 6 (six) hours as needed for mild pain or headache.     Marland Kitchen amLODipine (NORVASC) 10 MG tablet Take 0.5 tablets (5 mg total) by mouth daily. 45 tablet 3  . amoxicillin (AMOXIL) 500 MG tablet Take 4 tablets by mouth one hour prior to dental procedure 8 tablet 2  . aspirin 81 MG tablet Take 81 mg by mouth daily.    Marland Kitchen atorvastatin (LIPITOR) 10 MG tablet Take 1 tablet (10 mg total) by mouth daily at 6 PM. 90 tablet 3  . lisinopril (ZESTRIL) 40 MG tablet Take 1 tablet by mouth once daily 90 tablet 0  . metoprolol succinate (TOPROL-XL) 50 MG 24 hr tablet TAKE 1 TABLET BY MOUTH ONCE DAILY WITH  OR  IMMEDIATELY  FOLLOWING  A  MEAL 90 tablet 3  . Multiple Vitamins-Minerals (HM MULTIVITAMIN ADULT GUMMY PO) Chew two gummies daily    . spironolactone (ALDACTONE) 25 MG tablet Take 1/2 (one-half) tablet by mouth once daily 45 tablet 2   No current facility-administered medications for this visit.     PHYSICAL EXAMINATION: ECOG PERFORMANCE STATUS: 0 - Asymptomatic  Vitals:   12/07/18 1017  BP: (!) 145/54  Pulse: (!) 109  Resp: 16  Temp: 97.8 F (36.6 C)  SpO2: 97%   Filed Weights   12/07/18 1017  Weight: 101 lb (45.8 kg)    GENERAL:alert, no distress and comfortable Musculoskeletal:no cyanosis of digits and no clubbing  NEURO: alert & oriented x 3 with fluent speech, no focal motor/sensory deficits  LABORATORY DATA:  I have reviewed the data as listed    Component Value Date/Time   NA 136 12/06/2018 0745   NA 135 06/16/2018 1145   NA 137 12/18/2016 0916   K 4.4 12/06/2018 0745   K 4.4 12/18/2016 0916   CL 101 12/06/2018 0745   CL 105 05/20/2012 0823   CO2 23 12/06/2018 0745   CO2 25 12/18/2016 0916   GLUCOSE 101 (H) 12/06/2018 0745   GLUCOSE 88 12/18/2016 0916   GLUCOSE 70 05/20/2012 0823   BUN 25 (H)  12/06/2018 0745   BUN 25 06/16/2018 1145   BUN 14.0 12/18/2016 0916   CREATININE 1.58 (H) 12/06/2018 0745   CREATININE 1.0 12/18/2016 0916   CALCIUM 10.7 (H) 12/06/2018 0745   CALCIUM 9.9 12/18/2016 0916   PROT 7.4 12/06/2018 0745   PROT 6.1 (L) 12/18/2016 0916   ALBUMIN 4.8 12/06/2018 0745   ALBUMIN 3.9 12/18/2016 0916   AST 38 12/06/2018 0745   AST 28 12/18/2016 0916   ALT 24 12/06/2018 0745   ALT 16 12/18/2016 0916   ALKPHOS 59 12/06/2018 0745   ALKPHOS 54 12/18/2016 0916   BILITOT 0.9 12/06/2018 0745   BILITOT 1.03 12/18/2016 0916   GFRNONAA 31 (L) 12/06/2018 0745   GFRAA 36 (L) 12/06/2018 0745    No results found for: SPEP, UPEP  Lab Results  Component Value Date   WBC 8.7 12/06/2018   NEUTROABS 4.0 12/06/2018   HGB 13.4 12/06/2018   HCT 39.6 12/06/2018   MCV 97.1 12/06/2018   PLT 76 (L) 12/06/2018      Chemistry      Component Value Date/Time   NA 136 12/06/2018 0745   NA 135 06/16/2018 1145   NA 137 12/18/2016 0916   K 4.4 12/06/2018 0745   K 4.4 12/18/2016 0916   CL 101 12/06/2018 0745  CL 105 05/20/2012 0823   CO2 23 12/06/2018 0745   CO2 25 12/18/2016 0916   BUN 25 (H) 12/06/2018 0745   BUN 25 06/16/2018 1145   BUN 14.0 12/18/2016 0916   CREATININE 1.58 (H) 12/06/2018 0745   CREATININE 1.0 12/18/2016 0916      Component Value Date/Time   CALCIUM 10.7 (H) 12/06/2018 0745   CALCIUM 9.9 12/18/2016 0916   ALKPHOS 59 12/06/2018 0745   ALKPHOS 54 12/18/2016 0916   AST 38 12/06/2018 0745   AST 28 12/18/2016 0916   ALT 24 12/06/2018 0745   ALT 16 12/18/2016 0916   BILITOT 0.9 12/06/2018 0745   BILITOT 1.03 12/18/2016 0916       RADIOGRAPHIC STUDIES: I have reviewed multiple imaging studies with the patient I have personally reviewed the radiological images as listed and agreed with the findings in the report. Ct Chest W Contrast  Result Date: 12/06/2018 CLINICAL DATA:  CLL. EXAM: CT CHEST, ABDOMEN, AND PELVIS WITH CONTRAST TECHNIQUE:  Multidetector CT imaging of the chest, abdomen and pelvis was performed following the standard protocol during bolus administration of intravenous contrast. CONTRAST:  45mL OMNIPAQUE IOHEXOL 300 MG/ML  SOLN COMPARISON:  06/17/2016. FINDINGS: CT CHEST FINDINGS Cardiovascular: Atherosclerotic calcification of the aorta. Aortic valve replacement. Heart size normal. No pericardial effusion. Mediastinum/Nodes: No pathologically enlarged mediastinal, hilar or axillary lymph nodes. Esophagus is grossly unremarkable. Lungs/Pleura: 3 mm nodule along the lateral minor fissure is unchanged and likely a subpleural lymph node. Lungs are otherwise clear. No pleural fluid. Airway is unremarkable. Musculoskeletal: Degenerative changes in the spine. No worrisome lytic or sclerotic lesions. CT ABDOMEN PELVIS FINDINGS Hepatobiliary: Liver is unremarkable. Pneumobilia as before. Cholecystectomy. No biliary ductal dilatation. Pancreas: Negative. Spleen: Subcentimeter low-attenuation lesion in the spleen is too small to characterize unchanged. Adrenals/Urinary Tract: Adrenal glands are unremarkable. Subcentimeter low-attenuation lesions in the kidneys are too small to characterize but statistically, cysts are likely. Left kidney is atrophic. Ureters are decompressed. Bladder is grossly unremarkable. Stomach/Bowel: Stomach, small bowel and colon are unremarkable. Appendix is not readily visualized. Vascular/Lymphatic: Atherosclerotic calcification of the aorta without aneurysm. Small bowel mesenteric lymph nodes have increased in size and number. Index 7 mm nodule in the right lower quadrant (2/86), previously 3 mm. Abdominal retroperitoneal lymph nodes have increased in size as well, measuring up to 6 mm in the aortocaval station (2/69), previously 2 mm. Reproductive: Hysterectomy.  No adnexal mass. Other: No free fluid. Mesenteries and peritoneum are otherwise unremarkable. Musculoskeletal: Degenerative changes in the spine. No  worrisome lytic or sclerotic lesions. IMPRESSION: 1. Increase in size and number of abdominal retroperitoneal and small bowel mesenteric lymph nodes, consistent with recurrent CLL. 2.  Aortic atherosclerosis (ICD10-170.0). Electronically Signed   By: Lorin Picket M.D.   On: 12/06/2018 10:57   Ct Abdomen Pelvis W Contrast  Result Date: 12/06/2018 CLINICAL DATA:  CLL. EXAM: CT CHEST, ABDOMEN, AND PELVIS WITH CONTRAST TECHNIQUE: Multidetector CT imaging of the chest, abdomen and pelvis was performed following the standard protocol during bolus administration of intravenous contrast. CONTRAST:  41mL OMNIPAQUE IOHEXOL 300 MG/ML  SOLN COMPARISON:  06/17/2016. FINDINGS: CT CHEST FINDINGS Cardiovascular: Atherosclerotic calcification of the aorta. Aortic valve replacement. Heart size normal. No pericardial effusion. Mediastinum/Nodes: No pathologically enlarged mediastinal, hilar or axillary lymph nodes. Esophagus is grossly unremarkable. Lungs/Pleura: 3 mm nodule along the lateral minor fissure is unchanged and likely a subpleural lymph node. Lungs are otherwise clear. No pleural fluid. Airway is unremarkable. Musculoskeletal: Degenerative changes in the  spine. No worrisome lytic or sclerotic lesions. CT ABDOMEN PELVIS FINDINGS Hepatobiliary: Liver is unremarkable. Pneumobilia as before. Cholecystectomy. No biliary ductal dilatation. Pancreas: Negative. Spleen: Subcentimeter low-attenuation lesion in the spleen is too small to characterize unchanged. Adrenals/Urinary Tract: Adrenal glands are unremarkable. Subcentimeter low-attenuation lesions in the kidneys are too small to characterize but statistically, cysts are likely. Left kidney is atrophic. Ureters are decompressed. Bladder is grossly unremarkable. Stomach/Bowel: Stomach, small bowel and colon are unremarkable. Appendix is not readily visualized. Vascular/Lymphatic: Atherosclerotic calcification of the aorta without aneurysm. Small bowel mesenteric lymph  nodes have increased in size and number. Index 7 mm nodule in the right lower quadrant (2/86), previously 3 mm. Abdominal retroperitoneal lymph nodes have increased in size as well, measuring up to 6 mm in the aortocaval station (2/69), previously 2 mm. Reproductive: Hysterectomy.  No adnexal mass. Other: No free fluid. Mesenteries and peritoneum are otherwise unremarkable. Musculoskeletal: Degenerative changes in the spine. No worrisome lytic or sclerotic lesions. IMPRESSION: 1. Increase in size and number of abdominal retroperitoneal and small bowel mesenteric lymph nodes, consistent with recurrent CLL. 2.  Aortic atherosclerosis (ICD10-170.0). Electronically Signed   By: Lorin Picket M.D.   On: 12/06/2018 10:57    All questions were answered. The patient knows to call the clinic with any problems, questions or concerns. No barriers to learning was detected.  I spent 30 minutes counseling the patient face to face. The total time spent in the appointment was 40 minutes and more than 50% was on counseling and review of test results  Heath Lark, MD 12/08/2018 9:15 AM

## 2018-12-08 NOTE — Assessment & Plan Note (Signed)
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

## 2018-12-08 NOTE — Telephone Encounter (Signed)
Attempted to call patient on both numbers on file. Scheduled her for office visit (including EKG) with Dr. Burt Knack after echo on 12/10.  Will attempt to call again later to confirm.

## 2018-12-08 NOTE — Telephone Encounter (Signed)
Oral Oncology Patient Advocate Encounter  Received notification from [employer] that prior authorization for Calquence is required.  PA submitted on CoverMyMeds Key [id number] Status is pending  Oral Oncology Clinic will continue to follow.  [place] Patient [place] Phone [phone] Fax [phone] 12/08/2018 10:25 AM

## 2018-12-08 NOTE — Telephone Encounter (Signed)
Confirmed visit with Dr. Burt Knack after echo on 12/10. [name] was grateful for call and agrees with treatment plan.

## 2018-12-09 NOTE — Telephone Encounter (Signed)
Oral Chemotherapy Pharmacist Encounter  Patient scheduled to pick up her medication from [facility] on 12/27/2018. She knows not to start until her planned start of 01/03/2019. Patient education provided but I plan on re-educating closer to start date.   Patient Education I spoke with patient for overview of new oral chemotherapy medication: Calquence (acalabrutinib) for the treatment of relapsed CLL, planned duration until disease progression or unacceptable drug toxicity.   Counseled patient on administration, dosing, side effects, monitoring, drug-food interactions, safe handling, storage, and disposal. Patient will take 1 capsule (100 mg total) by mouth 2 (two) times daily.  Side effects include but not limited to: headache, diarrhea, decreased wbc/plt.    Reviewed with patient importance of keeping a medication schedule and plan for any missed doses.  Nicole Mercado voiced understanding and appreciation. All questions answered. Medication handout placed in the mail.  Provided patient with Oral [facility] phone number. Patient knows to call the office with questions or concerns. Oral Chemotherapy [facility] will continue to follow.  Darl Pikes, PharmD, BCPS, [facility] Hematology/Oncology Clinical Pharmacist [facility]/AP Oral [place] [facility] [phone]  12/09/2018 11:00 AM

## 2018-12-10 ENCOUNTER — Telehealth: Payer: Self-pay | Admitting: Hematology and Oncology

## 2018-12-10 NOTE — Telephone Encounter (Signed)
Confirmed January visit with patient.

## 2018-12-16 ENCOUNTER — Ambulatory Visit (INDEPENDENT_AMBULATORY_CARE_PROVIDER_SITE_OTHER): Payer: Medicare Other | Admitting: Cardiovascular Disease

## 2018-12-16 ENCOUNTER — Other Ambulatory Visit: Payer: Self-pay

## 2018-12-16 ENCOUNTER — Encounter: Payer: Self-pay | Admitting: Cardiovascular Disease

## 2018-12-16 ENCOUNTER — Ambulatory Visit (HOSPITAL_COMMUNITY): Payer: Medicare Other | Attending: Cardiology

## 2018-12-16 VITALS — BP 142/72 | HR 62 | Ht 60.0 in | Wt 101.8 lb

## 2018-12-16 DIAGNOSIS — I359 Nonrheumatic aortic valve disorder, unspecified: Secondary | ICD-10-CM

## 2018-12-16 DIAGNOSIS — I10 Essential (primary) hypertension: Secondary | ICD-10-CM | POA: Diagnosis not present

## 2018-12-16 DIAGNOSIS — I2510 Atherosclerotic heart disease of native coronary artery without angina pectoris: Secondary | ICD-10-CM | POA: Diagnosis not present

## 2018-12-16 DIAGNOSIS — E782 Mixed hyperlipidemia: Secondary | ICD-10-CM | POA: Diagnosis not present

## 2018-12-16 MED ORDER — METOPROLOL SUCCINATE ER 50 MG PO TB24: 50.0000 mg | ORAL_TABLET | Freq: Every day | ORAL | 3 refills | Status: DC

## 2018-12-16 MED ORDER — ATORVASTATIN CALCIUM 10 MG PO TABS: 10.0000 mg | ORAL_TABLET | Freq: Every day | ORAL | 3 refills | Status: DC

## 2018-12-16 MED ORDER — AMLODIPINE BESYLATE 5 MG PO TABS: 5.0000 mg | ORAL_TABLET | Freq: Every day | ORAL | 3 refills | Status: DC

## 2018-12-16 MED ORDER — LISINOPRIL 20 MG PO TABS: 20.0000 mg | ORAL_TABLET | Freq: Every day | ORAL | 3 refills | Status: DC

## 2018-12-16 NOTE — Patient Instructions (Signed)
Medication Instructions:  1) DECREASE LISINOPRIL to 20 mg daily *If you need a refill on your cardiac medications before your next appointment, please call your pharmacy*  Follow-Up: At [facility], you and your health needs are our priority.  As part of our continuing mission to provide you with exceptional heart care, we have created designated Provider Care Teams.  These Care Teams include your primary Cardiologist (physician) and Advanced Practice Providers (APPs -  Physician Assistants and Nurse Practitioners) who all work together to provide you with the care you need, when you need it. Your next appointment:   12 month(s) The format for your next appointment:   In Person Provider:   You may see Sherren Mocha, MD or one of the following Advanced Practice Providers on your designated Care Team:    Richardson Dopp, PA-C  [place], [place]  Daune Perch, NP  Other Instructions Make sure to stay well hydrated!

## 2018-12-16 NOTE — Progress Notes (Signed)
Cardiology Office Note:    Date:  12/17/2018   ID:  Nicole Mercado, DOB 09-03-1940, MRN [id number]  PCP:  Kristie Cowman, MD  Cardiologist:  Sherren Mocha, MD  Electrophysiologist:  None   Referring MD: Kristie Cowman, MD   Chief Complaint  Patient presents with  . Fatigue    History of Present Illness:    Nicole Mercado is a 78 y.o. female with a hx of aortic stenosis and CAD presenting for follow-up evaluation. She presented in 2016 with heart failure and NSTEMI, ultimately undergoing cardiac surgery with LIMA-LAD, SVG-OM1, SVG-diagonal, and SVG-D2 grafting and bioprosthetic AVR with a 21 mm Magna Ease pericardial tissue valve. Comorbid conditions include carotid disease with hx of left carotid endarterectomy, HTN, hyperlipidemia, and chronic lymphocytic leukemia.  The patient is here alone today.  She has not had any specific cardiac-related symptoms.  She denies chest pain, shortness of breath, orthopnea, PND, heart palpitations, or leg swelling.  She has been diagnosed with recurrent CLL and will begin a new chemotherapeutic agent in the near future.  The patient had an echocardiogram just prior to her visit today.  The formal interpretation is pending but I am able to review the images.  In addition, review of her recent labs demonstrate some progression of chronic kidney disease with most recent creatinine 1.58, increased from 1.22 in June 2020.  Past Medical History:  Diagnosis Date  . Aortic stenosis    a. Echocardiogram (01/11/14):  Mild LVH, EF 60-65%, Gr 1 DD, possible bicuspid AV, severe AS (mean 61 mmHg, peak 104 mmHg), mild MVP of post leaflet, mild MR, PASP 33 mmHg.;  b. 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 Arthritis   . Atrial fibrillation ([place])    post op after CABG+AVR >> Amiodarone  . CAD (coronary artery disease)    a. LHC (01/13/14):  dLM 70 extending into oLAD, mLAD 40-50, pD1 80, pD2 70, ostial/prox OM1 70 >> CABG (L-LAD, S-OM1, S-D1, S-D2)    . Carotid artery occlusion     a. s/p L CEA 2013;  b.  Carotid US (1/16):  Bilateral ICA 1-39%  . CLL (chronic lymphocytic leukemia) (HCC)    slow leukemia---Dr  Murinson  . H/O exercise stress test    NSSTT  . Hx of cardiovascular stress test    a. Nuclear stress test That (4/13): Normal perfusion, EF 74%  . Hx of echocardiogram    Echo (2/16):  Mild LVH, EF 60-65%, no RWMA, Gr 2 DD, AVR ok (mean 9 mmHg), trivial AI, mild MR, mild LAE, PASP 40 mmHg  . Hypertension   . Pancreatitis    h/o  . Thrombocytopenia ([place]) 11/19/2010    Past Surgical History:  Procedure Laterality Date  . ABDOMINAL HYSTERECTOMY    . AORTIC VALVE REPLACEMENT [name] 01/17/2014   Procedure: AORTIC VALVE REPLACEMENT (AVR);  Surgeon: Gaye Pollack, MD;  Location: [place];  Service: Open Heart Surgery;  Laterality: [name];  . APPENDECTOMY    . CAROTID ENDARTERECTOMY  06/09/11   LEFT  cea  . CESAREAN SECTION     FIVE  . CHOLECYSTECTOMY    . CORONARY ARTERY BYPASS GRAFT [name] 01/17/2014   Procedure: CORONARY ARTERY BYPASS GRAFTING (CABG)TIMES FOUR USING LEFT INTERNAL MAMMARY ARTERY AND RIGHT SAPHENOUS VEIN HARVESTED ENDOSCOPICALLY;  Surgeon: Gaye Pollack, MD;  Location: [place];  Service: Open Heart Surgery;  Laterality: [name];  . ENDARTERECTOMY  06/09/2011   Procedure: ENDARTERECTOMY CAROTID;  Surgeon: Rosetta Posner, MD;  [facility];  Service: Vascular;  Laterality: Left;  left carotid endarterectomy with patch angioplasty  . LEFT AND RIGHT HEART CATHETERIZATION WITH CORONARY ANGIOGRAM [name] 01/13/2014   Procedure: LEFT AND RIGHT HEART CATHETERIZATION WITH CORONARY ANGIOGRAM;  Surgeon: Jettie Booze, MD;  Location[facility] C[facility] LAB;  Service: Cardiovascular;  Laterality: [name];  . TEE WITHOUT CARDIOVERSION [name] 01/17/2014   Procedure: TRANSESOPHAGEAL ECHOCARDIOGRAM (TEE);  Surgeon: Gaye Pollack, MD;  Location: [place];  Service: Open Heart Surgery;  Laterality: [name];  . TONSILLECTOMY      Current Medications: Current Meds  Medication Sig  . acalabrutinib  (CALQUENCE) 100 MG capsule Take 1 capsule (100 mg total) by mouth 2 (two) times daily.  Marland Kitchen acetaminophen (TYLENOL) 325 MG tablet Take 650 mg by mouth every 6 (six) hours as needed for mild pain or headache.   Marland Kitchen amLODipine (NORVASC) 5 MG tablet Take 1 tablet (5 mg total) by mouth daily.  Marland Kitchen amoxicillin (AMOXIL) 500 MG tablet Take 4 tablets by mouth one hour prior to dental procedure  . aspirin 81 MG tablet Take 81 mg by mouth daily.  Marland Kitchen lisinopril (ZESTRIL) 20 MG tablet Take 1 tablet (20 mg total) by mouth daily.  . metoprolol succinate (TOPROL-XL) 50 MG 24 hr tablet Take 1 tablet (50 mg total) by mouth daily.  . Multiple Vitamins-Minerals (HM MULTIVITAMIN ADULT GUMMY PO) Chew two gummies daily  . spironolactone (ALDACTONE) 25 MG tablet Take 1/2 (one-half) tablet by mouth once daily  . [DISCONTINUED] amLODipine (NORVASC) 10 MG tablet Take 0.5 tablets (5 mg total) by mouth daily.  . [DISCONTINUED] lisinopril (ZESTRIL) 40 MG tablet Take 1 tablet by mouth once daily  . [DISCONTINUED] metoprolol succinate (TOPROL-XL) 50 MG 24 hr tablet TAKE 1 TABLET BY MOUTH ONCE DAILY WITH  OR  IMMEDIATELY  FOLLOWING  A  MEAL     Allergies:   Patient has no known allergies.   Social History   Socioeconomic History  . Marital status: Widowed    Spouse name: Not on file  . Number of children: Not on file  . Years of education: Not on file  . Highest education level: Not on file  Occupational History  . Not on file  Tobacco Use  . Smoking status: Never Smoker  . Smokeless tobacco: Never Used  Substance and Sexual Activity  . Alcohol use: No  . Drug use: No  . Sexual activity: Not on file  Other Topics Concern  . Not on file  Social History Narrative  . Not on file   Social Determinants of Health   Financial Resource Strain:   . Difficulty of Paying Living Expenses: Not on file  Food Insecurity:   . Worried About Charity fundraiser in the Last Year: Not on file  . Ran Out of Food in the Last Year:  Not on file  Transportation Needs:   . Lack of Transportation (Medical): Not on file  . Lack of Transportation (Non-Medical): Not on file  Physical Activity:   . Days of Exercise per Week: Not on file  . Minutes of Exercise per Session: Not on file  Stress:   . Feeling of Stress : Not on file  Social Connections:   . Frequency of Communication with Friends and Family: Not on file  . Frequency of Social Gatherings with Friends and Family: Not on file  . Attends Religious Services: Not on file  . Active Member of Clubs or Organizations: Not on file  . Attends Club  or Organization Meetings: Not on file  . Marital Status: Not on file     Family History: The patient's family history includes Heart attack in her mother; Heart disease in her father and mother; Hypertension in her father, mother, sister, and son; Stroke in her paternal grandfather and paternal grandmother.  ROS:   Please see the history of present illness.    All other systems reviewed and are negative.  EKGs/Labs/Other Studies Reviewed:    The following studies were reviewed today: Echo 12-26-2016: Study Conclusions  - Left ventricle: The cavity size was normal. Wall thickness was   increased in a pattern of mild LVH. Systolic function was normal.   The estimated ejection fraction was in the range of 60% to 65%.   Features are consistent with a pseudonormal left ventricular   filling pattern, with concomitant abnormal relaxation and   increased filling pressure (grade 2 diastolic dysfunction).   Doppler parameters are consistent with high ventricular filling   pressure. - Aortic valve: AV is well seated. Peak and mean gradients through   the valve are 23 and 10 mm Hg respectively There is mild   perivalvular AI. No significant change from echo from 2017 There   was mild regurgitation. - Mitral valve: Calcified annulus. Mildly thickened leaflets .   There was mild regurgitation. - Left atrium: The atrium was  mildly dilated. - Right atrium: The atrium was mildly dilated. - Pulmonary arteries: PA peak pressure: 36 mm Hg (S).  EKG:  EKG is ordered today.  The ekg ordered today demonstrates sinus bradycardia 55 bpm, age-indeterminate septal infarct, otherwise normal.  Recent Labs: 12/06/2018: ALT 24; BUN 25; Creatinine, Ser 1.58; Hemoglobin 13.4; Platelets 76; Potassium 4.4; Sodium 136  Recent Lipid Panel    Component Value Date/Time   CHOL 154 01/08/2018 0821   TRIG 125 01/08/2018 0821   HDL 70 01/08/2018 0821   CHOLHDL 2.2 01/08/2018 0821   CHOLHDL 2.0 05/01/2015 0920   VLDL 23 05/01/2015 0920   LDLCALC 59 01/08/2018 0821    Physical Exam:    VS:  BP (!) 142/72   Pulse 62   Ht 5' (1.524 m)   Wt 101 lb 12.8 oz (46.2 kg)   SpO2 99%   BMI 19.88 kg/m     Wt Readings from Last 3 Encounters:  12/16/18 101 lb 12.8 oz (46.2 kg)  12/07/18 101 lb (45.8 kg)  10/07/18 103 lb 3.2 oz (46.8 kg)     GEN: Well nourished, well developed in no acute distress HEENT: Normal NECK: No JVD; No carotid bruits LYMPHATICS: No lymphadenopathy CARDIAC: RRR, soft systolic murmur at the right upper sternal border, no diastolic murmur RESPIRATORY:  Clear to auscultation without rales, wheezing or rhonchi  ABDOMEN: Soft, non-tender, non-distended MUSCULOSKELETAL:  No edema; No deformity  SKIN: Warm and dry NEUROLOGIC:  Alert and oriented x 3 PSYCHIATRIC:  Normal affect   ASSESSMENT:    1. Aortic valve disorder   2. Coronary artery disease involving native coronary artery of native heart without angina pectoris   3. Essential hypertension   4. Mixed hyperlipidemia    PLAN:    In order of problems listed above:  1. The patient continues to do well from a cardiac perspective.  She has no symptoms of angina or heart failure.  I have personally reviewed her echo images which demonstrate normal LV systolic function.  The patient's aortic bioprosthesis appears to be functioning normally with normal  transvalvular gradients.  There is mild  valvular regurgitation which does not appear changed compared to her previous studies.  I will plan to see her back in clinical follow-up in 1 year and repeat an echocardiogram in 2 years. 2. The patient is stable without symptoms of angina.  She has developed thrombocytopenia likely related to CLL recurrence.  She has not had bleeding problems and her platelet count remains greater than 50,000.  If platelets drop below 50,000, she should probably stop aspirin. 3. Blood pressure controlled.  I recommended reduction in lisinopril from 40 to 20 mg daily.  She does have some dizziness and her creatinine is slowly increasing.  I advised her to push fluids.  I am sure she will have lab monitoring through the cancer center and we will need to keep an eye on her kidney function. 4.   Treated with atorvastatin.  Most recent lipids reviewed  with an LDL cholesterol 59 mg/dL.   Medication Adjustments/Labs and Tests Ordered: Current medicines are reviewed at length with the patient today.  Concerns regarding medicines are outlined above.  Orders Placed This Encounter  Procedures  . EKG 12-Lead   Meds ordered this encounter  Medications  . lisinopril (ZESTRIL) 20 MG tablet    Sig: Take 1 tablet (20 mg total) by mouth daily.    Dispense:  90 tablet    Refill:  3  . metoprolol succinate (TOPROL-XL) 50 MG 24 hr tablet    Sig: Take 1 tablet (50 mg total) by mouth daily.    Dispense:  90 tablet    Refill:  3  . atorvastatin (LIPITOR) 10 MG tablet    Sig: Take 1 tablet (10 mg total) by mouth daily at 6 PM.    Dispense:  90 tablet    Refill:  3  . amLODipine (NORVASC) 5 MG tablet    Sig: Take 1 tablet (5 mg total) by mouth daily.    Dispense:  90 tablet    Refill:  3    Patient Instructions  Medication Instructions:  1) DECREASE LISINOPRIL to 20 mg daily *If you need a refill on your cardiac medications before your next appointment, please call your pharmacy*   Follow-Up: At [facility], you and your health needs are our priority.  As part of our continuing mission to provide you with exceptional heart care, we have created designated Provider Care Teams.  These Care Teams include your primary Cardiologist (physician) and Advanced Practice Providers (APPs -  Physician Assistants and Nurse Practitioners) who all work together to provide you with the care you need, when you need it. Your next appointment:   12 month(s) The format for your next appointment:   In Person Provider:   You may see Sherren Mocha, MD or one of the following Advanced Practice Providers on your designated Care Team:    Richardson Dopp, PA-C  [place], [place]  Daune Perch, NP  Other Instructions Make sure to stay well hydrated!    Signed, Sherren Mocha, MD  12/17/2018 7:23 AM    [place]

## 2018-12-20 ENCOUNTER — Ambulatory Visit: Payer: Medicare Other | Admitting: Cardiovascular Disease

## 2018-12-27 ENCOUNTER — Ambulatory Visit: Payer: Medicare Other | Admitting: Cardiology

## 2018-12-27 MED FILL — CALQUENCE 100 MG CAPSULE: 100 | 30 days supply | Qty: 60 | Fill #0

## 2019-01-14 ENCOUNTER — Other Ambulatory Visit: Payer: Self-pay | Admitting: Hematology and Oncology

## 2019-01-14 ENCOUNTER — Encounter: Payer: Self-pay | Admitting: Hematology and Oncology

## 2019-01-14 ENCOUNTER — Inpatient Hospital Stay: Payer: Medicare Other | Attending: Hematology and Oncology | Admitting: Hematology and Oncology

## 2019-01-14 ENCOUNTER — Inpatient Hospital Stay: Payer: Medicare Other

## 2019-01-14 ENCOUNTER — Other Ambulatory Visit: Payer: Self-pay

## 2019-01-14 DIAGNOSIS — I491 Atrial premature depolarization: Secondary | ICD-10-CM | POA: Insufficient documentation

## 2019-01-14 DIAGNOSIS — C9112 Chronic lymphocytic leukemia of B-cell type in relapse: Secondary | ICD-10-CM | POA: Diagnosis present

## 2019-01-14 DIAGNOSIS — C9110 Chronic lymphocytic leukemia of B-cell type not having achieved remission: Secondary | ICD-10-CM

## 2019-01-14 DIAGNOSIS — D696 Thrombocytopenia, unspecified: Secondary | ICD-10-CM

## 2019-01-14 DIAGNOSIS — Z79899 Other long term (current) drug therapy: Secondary | ICD-10-CM | POA: Insufficient documentation

## 2019-01-14 DIAGNOSIS — Z7982 Long term (current) use of aspirin: Secondary | ICD-10-CM | POA: Insufficient documentation

## 2019-01-14 DIAGNOSIS — R233 Spontaneous ecchymoses: Secondary | ICD-10-CM

## 2019-01-14 DIAGNOSIS — R238 Other skin changes: Secondary | ICD-10-CM

## 2019-01-14 DIAGNOSIS — Z9221 Personal history of antineoplastic chemotherapy: Secondary | ICD-10-CM | POA: Insufficient documentation

## 2019-01-14 LAB — COMPREHENSIVE METABOLIC PANEL
ALT: 15 U/L (ref 0–44)
AST: 25 U/L (ref 15–41)
Albumin: 4.4 g/dL (ref 3.5–5.0)
Alkaline Phosphatase: 61 U/L (ref 38–126)
Anion gap: 11 (ref 5–15)
BUN: 27 mg/dL — ABNORMAL HIGH (ref 8–23)
CO2: 24 mmol/L (ref 22–32)
Calcium: 9.7 mg/dL (ref 8.9–10.3)
Chloride: 102 mmol/L (ref 98–111)
Creatinine, Ser: 1.26 mg/dL — ABNORMAL HIGH (ref 0.44–1.00)
GFR calc Af Amer: 47 mL/min — ABNORMAL LOW (ref >60)
GFR calc non Af Amer: 41 mL/min — ABNORMAL LOW (ref >60)
Glucose, Bld: 99 mg/dL (ref 70–99)
Potassium: 4.7 mmol/L (ref 3.5–5.1)
Sodium: 137 mmol/L (ref 135–145)
Total Bilirubin: 0.8 mg/dL (ref 0.3–1.2)
Total Protein: 6.5 g/dL (ref 6.5–8.1)

## 2019-01-14 LAB — CBC WITH DIFFERENTIAL/PLATELET
Abs Immature Granulocytes: 0.02 10*3/uL (ref 0.00–0.07)
Basophils Absolute: 0 10*3/uL (ref 0.0–0.1)
Basophils Relative: 0 %
Eosinophils Absolute: 0 10*3/uL (ref 0.0–0.5)
Eosinophils Relative: 0 %
HCT: 37.7 % (ref 36.0–46.0)
Hemoglobin: 12.8 g/dL (ref 12.0–15.0)
Immature Granulocytes: 0 %
Lymphocytes Relative: 61 %
Lymphs Abs: 6.2 10*3/uL — ABNORMAL HIGH (ref 0.7–4.0)
MCH: 32.8 pg (ref 26.0–34.0)
MCHC: 34 g/dL (ref 30.0–36.0)
MCV: 96.7 fL (ref 80.0–100.0)
Monocytes Absolute: 0.5 10*3/uL (ref 0.1–1.0)
Monocytes Relative: 5 %
Neutro Abs: 3.5 10*3/uL (ref 1.7–7.7)
Neutrophils Relative %: 34 %
Platelets: 74 10*3/uL — ABNORMAL LOW (ref 150–400)
RBC: 3.9 MIL/uL (ref 3.87–5.11)
RDW: 11.7 % (ref 11.5–15.5)
WBC: 10.3 10*3/uL (ref 4.0–10.5)
nRBC: 0 % (ref 0.0–0.2)

## 2019-01-14 NOTE — Assessment & Plan Note (Signed)
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

## 2019-01-14 NOTE — Assessment & Plan Note (Signed)
She has abnormal heart rhythm on exam I ordered an EKG and reviewed Issues that she is in normal sinus rhythm with occasional PAC She is not symptomatic She will continue metoprolol

## 2019-01-14 NOTE — Assessment & Plan Note (Signed)
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 

## 2019-01-14 NOTE — Progress Notes (Signed)
[place] OFFICE PROGRESS NOTE  Patient Care Team: Kristie Cowman, MD as PCP - General (Family Medicine) Sherren Mocha, MD as PCP - Cardiology (Cardiology) Kristie Cowman, MD (Family Medicine) Terance Ice, MD (Inactive) (Cardiology)  ASSESSMENT & PLAN:  CLL (chronic lymphocytic leukemia) So far, she tolerated treatment well with expected side effects such as lymphocytosis, persistent thrombocytopenia, bruising, and mild diarrhea She has detectable heart rhythm abnormalities on exam EKG examination today show normal sinus rhythm with occasional PVC She is on metoprolol She will continue the same treatment without dose adjustment She will come here once a week for labs over the next 3 weeks and I will see her in a month for further follow-up I plan to repeat imaging study in 3 months, due early April 2021.  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    Easy bruising She has chronic easy bruising due to aspirin therapy and chemotherapy I recommend close monitoring for now  PAC (premature atrial contraction) She has abnormal heart rhythm on exam I ordered an EKG and reviewed Issues that she is in normal sinus rhythm with occasional PAC She is not symptomatic She will continue metoprolol   Orders Placed This Encounter  Procedures  . EKG 12-Lead    Ordered by an unspecified provider     All questions were answered. The patient knows to call the clinic with any problems, questions or concerns. The total time spent in the appointment was 30 minutes encounter with patients including review of chart and various tests results, discussions about plan of care and coordination of care plan   Heath Lark, MD 01/14/2019 10:01 AM  INTERVAL HISTORY: Please see below for problem  oriented charting.  SUMMARY OF ONCOLOGIC HISTORY: Oncology History  CLL (chronic lymphocytic leukemia) ([place])  03/21/2011 Initial Diagnosis   CLL (chronic lymphocytic leukemia)   01/14/2014 - 01/31/2014 Hospital Admission   The patient was hospitalized and was found to severe aortic stenosis causing syncopal episode. She received treatment with steroids with minimum success and required platelet transfusion.   09/18/2014 Imaging   CT scan of the chest, abdomen and pelvis show lymphadenopathy but low level burden of disease.   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 There was no stenosis. Mild peri-valvular aortic insufficiency is noted   11/04/2014 -  Chemotherapy   She is started on Ibrutinib   06/17/2016 Imaging   1. No lymphadenopathy noted in the chest, abdomen or pelvis to suggest recurrent disease. 2. Aortic atherosclerosis, in addition to left main and 3 vessel coronary artery disease. Status post median sternotomy for CABG including LIMA to the LAD. 3. 3 mm nonobstructive calculus in the lower pole collecting system of the left kidney. Mild atrophy of the left kidney. 4. Mild colonic diverticulosis, without evidence of acute diverticulitis at this time. 5. Additional incidental findings, as above.   12/06/2018 Imaging   1. Increase in size and number of abdominal retroperitoneal and small bowel mesenteric lymph nodes, consistent with recurrent CLL. 2.  Aortic atherosclerosis (ICD10-170.0).   12/07/2018 Cancer Staging   Staging form: Chronic Lymphocytic Leukemia / Small Lymphocytic Lymphoma, AJCC 8th Edition - Clinical stage from 12/07/2018: Modified Rai Stage IV (Modified Rai risk: High, Binet: Stage B, Lugano:  Stage II, Lymphocytosis: Absent, Adenopathy: Present, Organomegaly: Absent, Anemia: Absent, Thrombocytopenia: Present) - Signed by Heath Lark, MD on  12/07/2018   01/07/2019 -  Chemotherapy   The patient had acalabrutinib for chemotherapy treatment.       REVIEW OF SYSTEMS:   Constitutional: Denies fevers, chills or abnormal weight loss Eyes: Denies blurriness of vision Ears, nose, mouth, throat, and face: Denies mucositis or sore throat Respiratory: Denies cough, dyspnea or wheezes Cardiovascular: Denies palpitation, chest discomfort or lower extremity swelling Gastrointestinal:  Denies nausea, heartburn or change in bowel habits Skin: Denies abnormal skin rashes Lymphatics: Denies new lymphadenopathy or easy bruising Neurological:Denies numbness, tingling or new weaknesses Behavioral/Psych: Mood is stable, no new changes  All other systems were reviewed with the patient and are negative.  I have reviewed the past medical history, past surgical history, social history and family history with the patient and they are unchanged from previous note.  ALLERGIES:  has No Known Allergies.  MEDICATIONS:  Current Outpatient Medications  Medication Sig Dispense Refill  . acalabrutinib (CALQUENCE) 100 MG capsule Take 1 capsule (100 mg total) by mouth 2 (two) times daily. 60 capsule 11  . acetaminophen (TYLENOL) 325 MG tablet Take 650 mg by mouth every 6 (six) hours as needed for mild pain or headache.     Marland Kitchen amLODipine (NORVASC) 5 MG tablet Take 1 tablet (5 mg total) by mouth daily. 90 tablet 3  . amoxicillin (AMOXIL) 500 MG tablet Take 4 tablets by mouth one hour prior to dental procedure 8 tablet 2  . aspirin 81 MG tablet Take 81 mg by mouth daily.    Marland Kitchen atorvastatin (LIPITOR) 10 MG tablet Take 1 tablet (10 mg total) by mouth daily at 6 PM. 90 tablet 3  . lisinopril (ZESTRIL) 20 MG tablet Take 1 tablet (20 mg total) by mouth daily. 90 tablet 3  . metoprolol succinate (TOPROL-XL) 50 MG 24 hr tablet Take 1 tablet (50 mg total) by mouth daily. 90 tablet 3  . Multiple Vitamins-Minerals (HM MULTIVITAMIN ADULT GUMMY PO) Chew two gummies daily     . spironolactone (ALDACTONE) 25 MG tablet Take 1/2 (one-half) tablet by mouth once daily 45 tablet 2   No current facility-administered medications for this visit.    PHYSICAL EXAMINATION: ECOG PERFORMANCE STATUS: 0 - Asymptomatic  Vitals:   01/14/19 0831  BP: 138/79  Pulse: 62  Resp: 18  Temp: 97.8 F (36.6 C)  SpO2: 100%   Filed Weights   01/14/19 0831  Weight: 101 lb 6.4 oz (46 kg)    GENERAL:alert, no distress and comfortable SKIN: skin color, texture, turgor are normal, no rashes or significant lesions.  Noted skin bruises EYES: normal, Conjunctiva are pink and non-injected, sclera clear OROPHARYNX:no exudate, no erythema and lips, buccal mucosa, and tongue normal  NECK: supple, thyroid normal size, non-tender, without nodularity LYMPH:  no palpable lymphadenopathy in the cervical, axillary or inguinal LUNGS: clear to auscultation and percussion with normal breathing effort HEART: regular rate & rhythm with occasional extra heartbeat, soft murmur on the left sternal border, no lower extremity edema ABDOMEN:abdomen soft, non-tender and normal bowel sounds Musculoskeletal:no cyanosis of digits and no clubbing  NEURO: alert & oriented x 3 with fluent speech, no focal motor/sensory deficits  LABORATORY DATA:  I have reviewed the data as listed    Component Value Date/Time   NA 137 01/14/2019 0811   NA 135 06/16/2018 1145   NA 137 12/18/2016 0916   K 4.7 01/14/2019 [id number]  K 4.4 12/18/2016 0916   CL 102 01/14/2019 0811   CL 105 05/20/2012 0823   CO2 24 01/14/2019 0811   CO2 25 12/18/2016 0916   GLUCOSE 99 01/14/2019 0811   GLUCOSE 88 12/18/2016 0916   GLUCOSE 70 05/20/2012 0823   BUN 27 (H) 01/14/2019 0811   BUN 25 06/16/2018 1145   BUN 14.0 12/18/2016 0916   CREATININE 1.26 (H) 01/14/2019 0811   CREATININE 1.0 12/18/2016 0916   CALCIUM 9.7 01/14/2019 0811   CALCIUM 9.9 12/18/2016 0916   PROT 6.5 01/14/2019 0811   PROT 6.1 (L) 12/18/2016 0916   ALBUMIN 4.4  01/14/2019 0811   ALBUMIN 3.9 12/18/2016 0916   AST 25 01/14/2019 0811   AST 28 12/18/2016 0916   ALT 15 01/14/2019 0811   ALT 16 12/18/2016 0916   ALKPHOS 61 01/14/2019 0811   ALKPHOS 54 12/18/2016 0916   BILITOT 0.8 01/14/2019 0811   BILITOT 1.03 12/18/2016 0916   GFRNONAA 41 (L) 01/14/2019 0811   GFRAA 47 (L) 01/14/2019 0811    No results found for: SPEP, UPEP  Lab Results  Component Value Date   WBC 10.3 01/14/2019   NEUTROABS 3.5 01/14/2019   HGB 12.8 01/14/2019   HCT 37.7 01/14/2019   MCV 96.7 01/14/2019   PLT 74 (L) 01/14/2019      Chemistry      Component Value Date/Time   NA 137 01/14/2019 0811   NA 135 06/16/2018 1145   NA 137 12/18/2016 0916   K 4.7 01/14/2019 0811   K 4.4 12/18/2016 0916   CL 102 01/14/2019 0811   CL 105 05/20/2012 0823   CO2 24 01/14/2019 0811   CO2 25 12/18/2016 0916   BUN 27 (H) 01/14/2019 0811   BUN 25 06/16/2018 1145   BUN 14.0 12/18/2016 0916   CREATININE 1.26 (H) 01/14/2019 0811   CREATININE 1.0 12/18/2016 0916      Component Value Date/Time   CALCIUM 9.7 01/14/2019 0811   CALCIUM 9.9 12/18/2016 0916   ALKPHOS 61 01/14/2019 0811   ALKPHOS 54 12/18/2016 0916   AST 25 01/14/2019 0811   AST 28 12/18/2016 0916   ALT 15 01/14/2019 0811   ALT 16 12/18/2016 0916   BILITOT 0.8 01/14/2019 0811   BILITOT 1.03 12/18/2016 0916       RADIOGRAPHIC STUDIES: I have personally reviewed the radiological images as listed and agreed with the findings in the report. ECHOCARDIOGRAM COMPLETE  Result Date: 12/16/2018   ECHOCARDIOGRAM REPORT   Patient Name:   Nicole Mercado Date of Exam: 12/16/2018 Medical Rec #:  [id number]     Height:       60.0 in Accession #:    ID:[id number]    Weight:       101.0 lb Date of Birth:  1940-06-25    BSA:          1.40 m Patient Age:    20 years      BP:           145/54 mmHg Patient Gender: F             HR:           57 bpm. Exam Location:  [address] Procedure: 2D Echo, 3D Echo, Cardiac Doppler and Color  Doppler Indications:    I35.0 Aortic Stenosis  History:        Patient has prior history of Echocardiogram examinations, most  recent 12/26/2016. CAD, Prior CABG, Aortic Valve Disease,                 Arrythmias:Atrial Fibrillation; Risk Factors:Hypertension,                 Dyslipidemia and Family History of Coronary Artery Disease.                 Chronic Lymphocytic Leukemia (CLL), Aortic Valve Replacement                 (2016).  Sonographer:    Deliah Boston RDCS Referring Phys: [place]  1. Left ventricular ejection fraction, by visual estimation, is 60 to 65%. The left ventricle has normal function. There is no left ventricular hypertrophy.  2. Elevated left atrial pressure.  3. Left ventricular diastolic parameters are consistent with Grade II diastolic dysfunction (pseudonormalization).  4. Global right ventricle has mildly reduced systolic function.The right ventricular size is normal. No increase in right ventricular wall thickness.  5. Left atrial size was mildly dilated.  6. Right atrial size was normal.  7. S/p bioprosthetic AVR. Normal function of prosthetic valve. Mean gradient 6 mmHg. Aortic valve regurgitation is mild.  8. The mitral valve is normal in structure. Trivial mitral valve regurgitation.  9. The tricuspid valve is normal in structure. Tricuspid valve regurgitation is trivial. 10. The pulmonic valve was not well visualized. Pulmonic valve regurgitation is not visualized. 11. The tricuspid regurgitant velocity is 2.97 m/s, and with an assumed right atrial pressure of 3 mmHg, the estimated right ventricular systolic pressure is mildly elevated at 38.3 mmHg. 12. The inferior vena cava is normal in size with greater than 50% respiratory variability, suggesting right atrial pressure of 3 mmHg. FINDINGS  Left Ventricle: Left ventricular ejection fraction, by visual estimation, is 60 to 65%. The left ventricle has normal function. The left ventricle has  no regional wall motion abnormalities. There is no left ventricular hypertrophy. Left ventricular diastolic parameters are consistent with Grade II diastolic dysfunction (pseudonormalization). Elevated left atrial pressure. Right Ventricle: The right ventricular size is normal. No increase in right ventricular wall thickness. Global RV systolic function is has mildly reduced systolic function. The tricuspid regurgitant velocity is 2.97 m/s, and with an assumed right atrial pressure of 3 mmHg, the estimated right ventricular systolic pressure is mildly elevated at 38.3 mmHg. Left Atrium: Left atrial size was mildly dilated. Right Atrium: Right atrial size was normal in size Pericardium: There is no evidence of pericardial effusion. Mitral Valve: The mitral valve is normal in structure. Trivial mitral valve regurgitation. Tricuspid Valve: The tricuspid valve is normal in structure. Tricuspid valve regurgitation is trivial. Aortic Valve: The aortic valve has been repaired/replaced. Aortic valve regurgitation is mild. Aortic regurgitation PHT measures 383 msec. The aortic valve is structurally normal, with no evidence of sclerosis or stenosis. Aortic valve mean gradient measures 6.0 mmHg. Aortic valve peak gradient measures 14.4 mmHg. Aortic valve area, by VTI measures 1.60 cm. Pulmonic Valve: The pulmonic valve was not well visualized. Pulmonic valve regurgitation is not visualized. Pulmonic regurgitation is not visualized. Aorta: The aortic root and ascending aorta are structurally normal, with no evidence of dilitation. Venous: The inferior vena cava is normal in size with greater than 50% respiratory variability, suggesting right atrial pressure of 3 mmHg. IAS/Shunts: The atrial septum is grossly normal.  LEFT VENTRICLE PLAX 2D LVIDd:         3.58 cm  Diastology LVIDs:  2.17 cm  LV e' lateral:   4.90 cm/s LV PW:         0.99 cm  LV E/e' lateral: 28.4 LV IVS:        0.65 cm  LV e' medial:    5.55 cm/s LVOT  diam:     2.00 cm  LV E/e' medial:  25.0 LV SV:         38 ml LV SV Index:   27.34 LVOT Area:     3.14 cm  RIGHT VENTRICLE RV S prime:     8.59 cm/s TAPSE (M-mode): 1.5 cm LEFT ATRIUM             Index       RIGHT ATRIUM           Index LA diam:        3.40 cm 2.44 cm/m  RA Area:     13.20 cm LA Vol (A2C):   50.8 ml 36.38 ml/m RA Volume:   32.60 ml  23.35 ml/m LA Vol (A4C):   45.0 ml 32.23 ml/m LA Biplane Vol: 50.8 ml 36.38 ml/m  AORTIC VALVE AV Area (Vmax):    1.43 cm AV Area (Vmean):   1.47 cm AV Area (VTI):     1.60 cm AV Vmax:           190.00 cm/s AV Vmean:          112.000 cm/s AV VTI:            0.491 m AV Peak Grad:      14.4 mmHg AV Mean Grad:      6.0 mmHg LVOT Vmax:         86.70 cm/s LVOT Vmean:        52.300 cm/s LVOT VTI:          0.250 m LVOT/AV VTI ratio: 0.51 AI PHT:            383 msec  AORTA Ao Root diam: 3.00 cm Ao Asc diam:  2.80 cm MITRAL VALVE                         TRICUSPID VALVE MV Area (PHT): cm                   TR Peak grad:   35.3 mmHg MV PHT:        msec                  TR Vmax:        297.00 cm/s MV Decel Time: 232 msec MV E velocity: 139.00 cm/s 103 cm/s  SHUNTS MV A velocity: 88.10 cm/s  70.3 cm/s Systemic VTI:  0.25 m MV E/A ratio:  1.58        1.5       Systemic Diam: 2.00 cm  Oswaldo Milian MD Electronically signed by Oswaldo Milian MD Signature Date/Time: 12/16/2018/4:08:15 PM    Final

## 2019-01-14 NOTE — Assessment & Plan Note (Addendum)
So far, she tolerated treatment well with expected side effects such as lymphocytosis, persistent thrombocytopenia, bruising, and mild diarrhea She has detectable heart rhythm abnormalities on exam EKG examination today show normal sinus rhythm with occasional PVC She is on metoprolol She will continue the same treatment without dose adjustment She will come here once a week for labs over the next 3 weeks and I will see her in a month for further follow-up I plan to repeat imaging study in 3 months, due early April 2021.

## 2019-01-17 ENCOUNTER — Other Ambulatory Visit: Payer: Self-pay | Admitting: Cardiovascular Disease

## 2019-01-17 ENCOUNTER — Ambulatory Visit: Payer: Medicare Other | Admitting: Cardiovascular Disease

## 2019-01-17 ENCOUNTER — Telehealth: Payer: Self-pay | Admitting: Hematology and Oncology

## 2019-01-17 DIAGNOSIS — Z952 Presence of prosthetic heart valve: Secondary | ICD-10-CM

## 2019-01-17 NOTE — Telephone Encounter (Signed)
I left a message regarding schedule  

## 2019-01-18 NOTE — Telephone Encounter (Signed)
Pt's pharmacy is requesting a refill on amoxicillin, for a dental appt that pt has. Please address

## 2019-01-21 ENCOUNTER — Inpatient Hospital Stay: Payer: Medicare Other

## 2019-01-21 ENCOUNTER — Telehealth: Payer: Self-pay | Admitting: *Deleted

## 2019-01-21 ENCOUNTER — Telehealth: Payer: Self-pay | Admitting: Hematology and Oncology

## 2019-01-21 ENCOUNTER — Other Ambulatory Visit: Payer: Self-pay

## 2019-01-21 DIAGNOSIS — C9110 Chronic lymphocytic leukemia of B-cell type not having achieved remission: Secondary | ICD-10-CM

## 2019-01-21 DIAGNOSIS — C9112 Chronic lymphocytic leukemia of B-cell type in relapse: Secondary | ICD-10-CM | POA: Diagnosis not present

## 2019-01-21 LAB — CBC WITH DIFFERENTIAL/PLATELET
Abs Immature Granulocytes: 0.06 10*3/uL (ref 0.00–0.07)
Basophils Absolute: 0.1 10*3/uL (ref 0.0–0.1)
Basophils Relative: 1 %
Eosinophils Absolute: 0 10*3/uL (ref 0.0–0.5)
Eosinophils Relative: 0 %
HCT: 35.7 % — ABNORMAL LOW (ref 36.0–46.0)
Hemoglobin: 12.1 g/dL (ref 12.0–15.0)
Immature Granulocytes: 1 %
Lymphocytes Relative: 63 %
Lymphs Abs: 7 10*3/uL — ABNORMAL HIGH (ref 0.7–4.0)
MCH: 32.5 pg (ref 26.0–34.0)
MCHC: 33.9 g/dL (ref 30.0–36.0)
MCV: 96 fL (ref 80.0–100.0)
Monocytes Absolute: 0.5 10*3/uL (ref 0.1–1.0)
Monocytes Relative: 4 %
Neutro Abs: 3.4 10*3/uL (ref 1.7–7.7)
Neutrophils Relative %: 31 %
Platelets: 76 10*3/uL — ABNORMAL LOW (ref 150–400)
RBC: 3.72 MIL/uL — ABNORMAL LOW (ref 3.87–5.11)
RDW: 11.8 % (ref 11.5–15.5)
WBC: 10.9 10*3/uL — ABNORMAL HIGH (ref 4.0–10.5)
nRBC: 0 % (ref 0.0–0.2)

## 2019-01-21 LAB — COMPREHENSIVE METABOLIC PANEL
ALT: 15 U/L (ref 0–44)
AST: 24 U/L (ref 15–41)
Albumin: 4.3 g/dL (ref 3.5–5.0)
Alkaline Phosphatase: 69 U/L (ref 38–126)
Anion gap: 10 (ref 5–15)
BUN: 22 mg/dL (ref 8–23)
CO2: 25 mmol/L (ref 22–32)
Calcium: 10.4 mg/dL — ABNORMAL HIGH (ref 8.9–10.3)
Chloride: 101 mmol/L (ref 98–111)
Creatinine, Ser: 1.12 mg/dL — ABNORMAL HIGH (ref 0.44–1.00)
GFR calc Af Amer: 54 mL/min — ABNORMAL LOW (ref >60)
GFR calc non Af Amer: 47 mL/min — ABNORMAL LOW (ref >60)
Glucose, Bld: 90 mg/dL (ref 70–99)
Potassium: 4.6 mmol/L (ref 3.5–5.1)
Sodium: 136 mmol/L (ref 135–145)
Total Bilirubin: 0.8 mg/dL (ref 0.3–1.2)
Total Protein: 6.9 g/dL (ref 6.5–8.1)

## 2019-01-21 NOTE — Telephone Encounter (Signed)
Patient came in to reschedule labs

## 2019-01-21 NOTE — Telephone Encounter (Signed)
-----   Message from Heath Lark, MD sent at 01/21/2019 12:09 PM EST ----- Regarding: labs are stable, please call and let her know

## 2019-01-21 NOTE — Telephone Encounter (Signed)
Patient advised of lab results as directed below. She appreciated the call and confirms her next appt.

## 2019-01-28 ENCOUNTER — Other Ambulatory Visit: Payer: Self-pay

## 2019-01-28 ENCOUNTER — Inpatient Hospital Stay: Payer: Medicare Other

## 2019-01-28 DIAGNOSIS — C9112 Chronic lymphocytic leukemia of B-cell type in relapse: Secondary | ICD-10-CM | POA: Diagnosis not present

## 2019-01-28 DIAGNOSIS — C9110 Chronic lymphocytic leukemia of B-cell type not having achieved remission: Secondary | ICD-10-CM

## 2019-01-28 LAB — CBC WITH DIFFERENTIAL/PLATELET
Abs Immature Granulocytes: 0.05 10*3/uL (ref 0.00–0.07)
Basophils Absolute: 0.1 10*3/uL (ref 0.0–0.1)
Basophils Relative: 1 %
Eosinophils Absolute: 0 10*3/uL (ref 0.0–0.5)
Eosinophils Relative: 0 %
HCT: 37.5 % (ref 36.0–46.0)
Hemoglobin: 12.4 g/dL (ref 12.0–15.0)
Immature Granulocytes: 1 %
Lymphocytes Relative: 56 %
Lymphs Abs: 6.1 10*3/uL — ABNORMAL HIGH (ref 0.7–4.0)
MCH: 32.6 pg (ref 26.0–34.0)
MCHC: 33.1 g/dL (ref 30.0–36.0)
MCV: 98.7 fL (ref 80.0–100.0)
Monocytes Absolute: 0.7 10*3/uL (ref 0.1–1.0)
Monocytes Relative: 6 %
Neutro Abs: 3.9 10*3/uL (ref 1.7–7.7)
Neutrophils Relative %: 36 %
Platelets: 94 10*3/uL — ABNORMAL LOW (ref 150–400)
RBC: 3.8 MIL/uL — ABNORMAL LOW (ref 3.87–5.11)
RDW: 11.9 % (ref 11.5–15.5)
WBC: 10.8 10*3/uL — ABNORMAL HIGH (ref 4.0–10.5)
nRBC: 0 % (ref 0.0–0.2)

## 2019-01-31 ENCOUNTER — Telehealth: Payer: Self-pay | Admitting: *Deleted

## 2019-01-31 NOTE — Telephone Encounter (Signed)
Telephone call to patient and advised lab results as directed below. Patient appreciated call.

## 2019-01-31 NOTE — Telephone Encounter (Signed)
-----   Message from Heath Lark, MD sent at 01/31/2019  8:04 AM EST ----- Regarding: pls let her know labs are better last week, platelets are improving

## 2019-02-03 MED FILL — CALQUENCE 100 MG CAPSULE: 100 | 30 days supply | Qty: 60 | Fill #1

## 2019-02-04 ENCOUNTER — Telehealth: Payer: Self-pay

## 2019-02-04 ENCOUNTER — Other Ambulatory Visit: Payer: Self-pay

## 2019-02-04 ENCOUNTER — Inpatient Hospital Stay: Payer: Medicare Other

## 2019-02-04 ENCOUNTER — Other Ambulatory Visit: Payer: Medicare Other

## 2019-02-04 DIAGNOSIS — C9110 Chronic lymphocytic leukemia of B-cell type not having achieved remission: Secondary | ICD-10-CM

## 2019-02-04 DIAGNOSIS — C9112 Chronic lymphocytic leukemia of B-cell type in relapse: Secondary | ICD-10-CM | POA: Diagnosis not present

## 2019-02-04 LAB — CBC WITH DIFFERENTIAL/PLATELET
Abs Immature Granulocytes: 0.03 10*3/uL (ref 0.00–0.07)
Basophils Absolute: 0.1 10*3/uL (ref 0.0–0.1)
Basophils Relative: 1 %
Eosinophils Absolute: 0 10*3/uL (ref 0.0–0.5)
Eosinophils Relative: 0 %
HCT: 34.6 % — ABNORMAL LOW (ref 36.0–46.0)
Hemoglobin: 11.7 g/dL — ABNORMAL LOW (ref 12.0–15.0)
Immature Granulocytes: 0 %
Lymphocytes Relative: 47 %
Lymphs Abs: 4.3 10*3/uL — ABNORMAL HIGH (ref 0.7–4.0)
MCH: 33.1 pg (ref 26.0–34.0)
MCHC: 33.8 g/dL (ref 30.0–36.0)
MCV: 98 fL (ref 80.0–100.0)
Monocytes Absolute: 0.4 10*3/uL (ref 0.1–1.0)
Monocytes Relative: 5 %
Neutro Abs: 4.3 10*3/uL (ref 1.7–7.7)
Neutrophils Relative %: 47 %
Platelets: 107 10*3/uL — ABNORMAL LOW (ref 150–400)
RBC: 3.53 MIL/uL — ABNORMAL LOW (ref 3.87–5.11)
RDW: 12.3 % (ref 11.5–15.5)
WBC: 9.1 10*3/uL (ref 4.0–10.5)
nRBC: 0 % (ref 0.0–0.2)

## 2019-02-04 NOTE — Telephone Encounter (Signed)
-----   Message from Heath Lark, MD sent at 02/04/2019  7:58 AM EST ----- Regarding: pls call and let her know she is responding very well, platelets continue to improve, continue the same

## 2019-02-04 NOTE — Telephone Encounter (Signed)
Called and given below message. She verbalized understanding. 

## 2019-02-11 ENCOUNTER — Encounter: Payer: Self-pay | Admitting: Hematology and Oncology

## 2019-02-11 ENCOUNTER — Inpatient Hospital Stay: Payer: Medicare Other | Attending: Hematology and Oncology

## 2019-02-11 ENCOUNTER — Other Ambulatory Visit: Payer: Self-pay

## 2019-02-11 ENCOUNTER — Inpatient Hospital Stay (HOSPITAL_BASED_OUTPATIENT_CLINIC_OR_DEPARTMENT_OTHER): Payer: Medicare Other | Admitting: Hematology and Oncology

## 2019-02-11 DIAGNOSIS — D696 Thrombocytopenia, unspecified: Secondary | ICD-10-CM

## 2019-02-11 DIAGNOSIS — C9110 Chronic lymphocytic leukemia of B-cell type not having achieved remission: Secondary | ICD-10-CM | POA: Diagnosis not present

## 2019-02-11 DIAGNOSIS — Z85828 Personal history of other malignant neoplasm of skin: Secondary | ICD-10-CM | POA: Insufficient documentation

## 2019-02-11 DIAGNOSIS — Z9221 Personal history of antineoplastic chemotherapy: Secondary | ICD-10-CM | POA: Diagnosis not present

## 2019-02-11 DIAGNOSIS — C9112 Chronic lymphocytic leukemia of B-cell type in relapse: Secondary | ICD-10-CM | POA: Insufficient documentation

## 2019-02-11 DIAGNOSIS — Z7982 Long term (current) use of aspirin: Secondary | ICD-10-CM | POA: Diagnosis not present

## 2019-02-11 DIAGNOSIS — C44319 Basal cell carcinoma of skin of other parts of face: Secondary | ICD-10-CM | POA: Insufficient documentation

## 2019-02-11 DIAGNOSIS — C44311 Basal cell carcinoma of skin of nose: Secondary | ICD-10-CM

## 2019-02-11 DIAGNOSIS — I10 Essential (primary) hypertension: Secondary | ICD-10-CM | POA: Diagnosis not present

## 2019-02-11 DIAGNOSIS — Z79899 Other long term (current) drug therapy: Secondary | ICD-10-CM | POA: Diagnosis not present

## 2019-02-11 LAB — CBC WITH DIFFERENTIAL/PLATELET
Abs Immature Granulocytes: 0.02 10*3/uL (ref 0.00–0.07)
Basophils Absolute: 0 10*3/uL (ref 0.0–0.1)
Basophils Relative: 1 %
Eosinophils Absolute: 0 10*3/uL (ref 0.0–0.5)
Eosinophils Relative: 0 %
HCT: 36 % (ref 36.0–46.0)
Hemoglobin: 12.2 g/dL (ref 12.0–15.0)
Immature Granulocytes: 0 %
Lymphocytes Relative: 50 %
Lymphs Abs: 4.3 10*3/uL — ABNORMAL HIGH (ref 0.7–4.0)
MCH: 33.3 pg (ref 26.0–34.0)
MCHC: 33.9 g/dL (ref 30.0–36.0)
MCV: 98.4 fL (ref 80.0–100.0)
Monocytes Absolute: 0.5 10*3/uL (ref 0.1–1.0)
Monocytes Relative: 6 %
Neutro Abs: 3.7 10*3/uL (ref 1.7–7.7)
Neutrophils Relative %: 43 %
Platelets: 101 10*3/uL — ABNORMAL LOW (ref 150–400)
RBC: 3.66 MIL/uL — ABNORMAL LOW (ref 3.87–5.11)
RDW: 12.3 % (ref 11.5–15.5)
WBC: 8.6 10*3/uL (ref 4.0–10.5)
nRBC: 0 % (ref 0.0–0.2)

## 2019-02-11 LAB — COMPREHENSIVE METABOLIC PANEL
ALT: 14 U/L (ref 0–44)
AST: 20 U/L (ref 15–41)
Albumin: 4.1 g/dL (ref 3.5–5.0)
Alkaline Phosphatase: 65 U/L (ref 38–126)
Anion gap: 10 (ref 5–15)
BUN: 30 mg/dL — ABNORMAL HIGH (ref 8–23)
CO2: 23 mmol/L (ref 22–32)
Calcium: 9.8 mg/dL (ref 8.9–10.3)
Chloride: 102 mmol/L (ref 98–111)
Creatinine, Ser: 1.34 mg/dL — ABNORMAL HIGH (ref 0.44–1.00)
GFR calc Af Amer: 44 mL/min — ABNORMAL LOW (ref >60)
GFR calc non Af Amer: 38 mL/min — ABNORMAL LOW (ref >60)
Glucose, Bld: 100 mg/dL — ABNORMAL HIGH (ref 70–99)
Potassium: 5.1 mmol/L (ref 3.5–5.1)
Sodium: 135 mmol/L (ref 135–145)
Total Bilirubin: 0.6 mg/dL (ref 0.3–1.2)
Total Protein: 6.6 g/dL (ref 6.5–8.1)

## 2019-02-11 NOTE — Progress Notes (Signed)
[facility] OFFICE PROGRESS NOTE  Patient Care Team: Kristie Cowman, MD as PCP - General (Family Medicine) Sherren Mocha, MD as PCP - Cardiology (Cardiology) Kristie Cowman, MD (Family Medicine) Terance Ice, MD (Inactive) (Cardiology)  ASSESSMENT & PLAN:  CLL (chronic lymphocytic leukemia) So far, she tolerated treatment well with expected side effects such as lymphocytosis, persistent thrombocytopenia, bruising, and mild diarrhea She will continue the same treatment without dose adjustment Due to stability of her blood work, I will see her again in a month for further follow-up I plan to repeat imaging study in a few months, due early April 2021.  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    Basal cell carcinoma (BCC) of [place] She has basal cell skin cancer She has appointment to see dermatologist for resection There is no contraindication for her to proceed from my standpoint  Essential hypertension She has poorly controlled hypertension, intermittently exacerbated by whitecoat hypertension She is not symptomatic We discussed the importance of close blood Pressure monitoring and follow-up with her cardiologist for medication adjustment if needed   No orders of the defined types were placed in this encounter.   All questions were answered. The patient knows to call the clinic with any problems, questions or concerns. The total time spent in the appointment was 20 minutes encounter with patients including review of chart and various tests results, discussions about plan of care and coordination of care plan   Heath Lark, MD 02/11/2019 9:25 AM  INTERVAL HISTORY: Please see below for problem oriented charting. She returns for further follow-up She continues to have easy  bruising She was recently diagnosed with basal cell carcinoma of the right side of her face and has appointment to follow-up with dermatologist for further resection The patient denies any recent signs or symptoms of bleeding such as spontaneous epistaxis, hematuria or hematochezia. Denies recent infection, fever or chills She have occasional diarrhea but it does not bother her  SUMMARY OF ONCOLOGIC HISTORY: Oncology History  CLL (chronic lymphocytic leukemia) ([place])  03/21/2011 Initial Diagnosis   CLL (chronic lymphocytic leukemia)   01/14/2014 - 01/31/2014 Hospital Admission   The patient was hospitalized and was found to severe aortic stenosis causing syncopal episode. She received treatment with steroids with minimum success and required platelet transfusion.   09/18/2014 Imaging   CT scan of the chest, abdomen and pelvis show lymphadenopathy but low level burden of disease.   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 There was no stenosis. Mild peri-valvular aortic insufficiency is noted   11/04/2014 -  Chemotherapy   She is started on Ibrutinib   06/17/2016 Imaging   1. No lymphadenopathy noted in the chest, abdomen or pelvis to suggest recurrent disease. 2. Aortic atherosclerosis, in addition to left main and 3 vessel coronary artery disease. Status post median sternotomy for CABG including LIMA to the LAD. 3. 3 mm nonobstructive calculus in the lower pole collecting system of the left kidney. Mild atrophy of the left kidney. 4. Mild colonic diverticulosis, without evidence of acute diverticulitis at this time. 5. Additional incidental findings, as above.   12/06/2018 Imaging   1. Increase in size and number of abdominal retroperitoneal and small bowel mesenteric lymph nodes, consistent  with recurrent CLL. 2.  Aortic atherosclerosis (ICD10-170.0).   12/07/2018 Cancer  Staging   Staging form: Chronic Lymphocytic Leukemia / Small Lymphocytic Lymphoma, AJCC 8th Edition - Clinical stage from 12/07/2018: Modified Rai Stage IV (Modified Rai risk: High, Binet: Stage B, Lugano: Stage II, Lymphocytosis: Absent, Adenopathy: Present, Organomegaly: Absent, Anemia: Absent, Thrombocytopenia: Present) - Signed by Heath Lark, MD on 12/07/2018   01/07/2019 -  Chemotherapy   The patient had acalabrutinib for chemotherapy treatment.       REVIEW OF SYSTEMS:   Constitutional: Denies fevers, chills or abnormal weight loss Eyes: Denies blurriness of vision Ears, nose, mouth, throat, and face: Denies mucositis or sore throat Respiratory: Denies cough, dyspnea or wheezes Cardiovascular: Denies palpitation, chest discomfort or lower extremity swelling Gastrointestinal:  Denies nausea, heartburn or change in bowel habits Skin: Denies abnormal skin rashes Lymphatics: Denies new lymphadenopathy  Neurological:Denies numbness, tingling or new weaknesses Behavioral/Psych: Mood is stable, no new changes  All other systems were reviewed with the patient and are negative.  I have reviewed the past medical history, past surgical history, social history and family history with the patient and they are unchanged from previous note.  ALLERGIES:  has No Known Allergies.  MEDICATIONS:  Current Outpatient Medications  Medication Sig Dispense Refill  . acalabrutinib (CALQUENCE) 100 MG capsule Take 1 capsule (100 mg total) by mouth 2 (two) times daily. 60 capsule 11  . acetaminophen (TYLENOL) 325 MG tablet Take 650 mg by mouth every 6 (six) hours as needed for mild pain or headache.     Marland Kitchen amLODipine (NORVASC) 5 MG tablet Take 1 tablet (5 mg total) by mouth daily. 90 tablet 3  . amoxicillin (AMOXIL) 500 MG capsule Take 4 capsules (2,000mg ) 1 hour prior to all dental appointments. 8 capsule 11  . aspirin 81 MG tablet Take 81 mg by mouth daily.    Marland Kitchen atorvastatin (LIPITOR) 10 MG tablet Take 1  tablet (10 mg total) by mouth daily at 6 PM. 90 tablet 3  . lisinopril (ZESTRIL) 20 MG tablet Take 1 tablet (20 mg total) by mouth daily. 90 tablet 3  . metoprolol succinate (TOPROL-XL) 50 MG 24 hr tablet Take 1 tablet (50 mg total) by mouth daily. 90 tablet 3  . Multiple Vitamins-Minerals (HM MULTIVITAMIN ADULT GUMMY PO) Chew two gummies daily    . spironolactone (ALDACTONE) 25 MG tablet Take 1/2 (one-half) tablet by mouth once daily 45 tablet 2   No current facility-administered medications for this visit.    PHYSICAL EXAMINATION: ECOG PERFORMANCE STATUS: 1 - Symptomatic but completely ambulatory  Vitals:   02/11/19 0916  BP: (!) 178/49  Pulse: 65  Resp: 18  Temp: 98 F (36.7 C)  SpO2: 100%   Filed Weights   02/11/19 0916  Weight: 102 lb 12.8 oz (46.6 kg)    GENERAL:alert, no distress and comfortable SKIN: Noted skin bruises.  She had basal cell carcinoma on the right side of her nose bridge NEURO: alert & oriented x 3 with fluent speech, no focal motor/sensory deficits  LABORATORY DATA:  I have reviewed the data as listed    Component Value Date/Time   NA 136 01/21/2019 1117   NA 135 06/16/2018 1145   NA 137 12/18/2016 0916   K 4.6 01/21/2019 1117   K 4.4 12/18/2016 0916   CL 101 01/21/2019 1117   CL 105 05/20/2012 0823   CO2 25 01/21/2019 1117   CO2 25 12/18/2016 0916   GLUCOSE 90 01/21/2019 1117  GLUCOSE 88 12/18/2016 0916   GLUCOSE 70 05/20/2012 0823   BUN 22 01/21/2019 1117   BUN 25 06/16/2018 1145   BUN 14.0 12/18/2016 0916   CREATININE 1.12 (H) 01/21/2019 1117   CREATININE 1.0 12/18/2016 0916   CALCIUM 10.4 (H) 01/21/2019 1117   CALCIUM 9.9 12/18/2016 0916   PROT 6.9 01/21/2019 1117   PROT 6.1 (L) 12/18/2016 0916   ALBUMIN 4.3 01/21/2019 1117   ALBUMIN 3.9 12/18/2016 0916   AST 24 01/21/2019 1117   AST 28 12/18/2016 0916   ALT 15 01/21/2019 1117   ALT 16 12/18/2016 0916   ALKPHOS 69 01/21/2019 1117   ALKPHOS 54 12/18/2016 0916   BILITOT 0.8  01/21/2019 1117   BILITOT 1.03 12/18/2016 0916   GFRNONAA 47 (L) 01/21/2019 1117   GFRAA 54 (L) 01/21/2019 1117    No results found for: SPEP, UPEP  Lab Results  Component Value Date   WBC 8.6 02/11/2019   NEUTROABS 3.7 02/11/2019   HGB 12.2 02/11/2019   HCT 36.0 02/11/2019   MCV 98.4 02/11/2019   PLT 101 (L) 02/11/2019      Chemistry      Component Value Date/Time   NA 136 01/21/2019 1117   NA 135 06/16/2018 1145   NA 137 12/18/2016 0916   K 4.6 01/21/2019 1117   K 4.4 12/18/2016 0916   CL 101 01/21/2019 1117   CL 105 05/20/2012 0823   CO2 25 01/21/2019 1117   CO2 25 12/18/2016 0916   BUN 22 01/21/2019 1117   BUN 25 06/16/2018 1145   BUN 14.0 12/18/2016 0916   CREATININE 1.12 (H) 01/21/2019 1117   CREATININE 1.0 12/18/2016 0916      Component Value Date/Time   CALCIUM 10.4 (H) 01/21/2019 1117   CALCIUM 9.9 12/18/2016 0916   ALKPHOS 69 01/21/2019 1117   ALKPHOS 54 12/18/2016 0916   AST 24 01/21/2019 1117   AST 28 12/18/2016 0916   ALT 15 01/21/2019 1117   ALT 16 12/18/2016 0916   BILITOT 0.8 01/21/2019 1117   BILITOT 1.03 12/18/2016 0916

## 2019-02-11 NOTE — Assessment & Plan Note (Signed)
She has poorly controlled hypertension, intermittently exacerbated by whitecoat hypertension She is not symptomatic We discussed the importance of close blood Pressure monitoring and follow-up with her cardiologist for medication adjustment if needed 

## 2019-02-11 NOTE — Assessment & Plan Note (Signed)
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 

## 2019-02-11 NOTE — Assessment & Plan Note (Signed)
So far, she tolerated treatment well with expected side effects such as lymphocytosis, persistent thrombocytopenia, bruising, and mild diarrhea She will continue the same treatment without dose adjustment Due to stability of her blood work, I will see her again in a month for further follow-up I plan to repeat imaging study in a few months, due early April 2021.

## 2019-02-11 NOTE — Assessment & Plan Note (Signed)
She has basal cell skin cancer She has appointment to see dermatologist for resection There is no contraindication for her to proceed from my standpoint

## 2019-02-16 ENCOUNTER — Telehealth: Payer: Self-pay | Admitting: Hematology and Oncology

## 2019-02-16 NOTE — Telephone Encounter (Signed)
Scheduled per 2/5 sch msg. Called pt and left a msg. Mailing printout

## 2019-03-07 MED FILL — CALQUENCE 100 MG CAPSULE: 100 | 30 days supply | Qty: 60 | Fill #2

## 2019-03-25 ENCOUNTER — Other Ambulatory Visit: Payer: Self-pay

## 2019-03-25 ENCOUNTER — Inpatient Hospital Stay: Payer: Medicare Other | Attending: Hematology and Oncology | Admitting: Hematology and Oncology

## 2019-03-25 ENCOUNTER — Encounter: Payer: Self-pay | Admitting: Hematology and Oncology

## 2019-03-25 ENCOUNTER — Inpatient Hospital Stay: Payer: Medicare Other

## 2019-03-25 ENCOUNTER — Telehealth: Payer: Self-pay | Admitting: Cardiovascular Disease

## 2019-03-25 ENCOUNTER — Telehealth: Payer: Self-pay

## 2019-03-25 DIAGNOSIS — Z7982 Long term (current) use of aspirin: Secondary | ICD-10-CM | POA: Diagnosis not present

## 2019-03-25 DIAGNOSIS — I2510 Atherosclerotic heart disease of native coronary artery without angina pectoris: Secondary | ICD-10-CM | POA: Insufficient documentation

## 2019-03-25 DIAGNOSIS — C9110 Chronic lymphocytic leukemia of B-cell type not having achieved remission: Secondary | ICD-10-CM

## 2019-03-25 DIAGNOSIS — Z9221 Personal history of antineoplastic chemotherapy: Secondary | ICD-10-CM | POA: Diagnosis not present

## 2019-03-25 DIAGNOSIS — D696 Thrombocytopenia, unspecified: Secondary | ICD-10-CM | POA: Diagnosis not present

## 2019-03-25 DIAGNOSIS — Z79899 Other long term (current) drug therapy: Secondary | ICD-10-CM | POA: Insufficient documentation

## 2019-03-25 DIAGNOSIS — N1831 Chronic kidney disease, stage 3a: Secondary | ICD-10-CM | POA: Insufficient documentation

## 2019-03-25 DIAGNOSIS — C9112 Chronic lymphocytic leukemia of B-cell type in relapse: Secondary | ICD-10-CM | POA: Diagnosis present

## 2019-03-25 DIAGNOSIS — E875 Hyperkalemia: Secondary | ICD-10-CM | POA: Insufficient documentation

## 2019-03-25 DIAGNOSIS — N179 Acute kidney failure, unspecified: Secondary | ICD-10-CM | POA: Diagnosis not present

## 2019-03-25 DIAGNOSIS — I10 Essential (primary) hypertension: Secondary | ICD-10-CM

## 2019-03-25 DIAGNOSIS — N183 Chronic kidney disease, stage 3 unspecified: Secondary | ICD-10-CM | POA: Insufficient documentation

## 2019-03-25 LAB — COMPREHENSIVE METABOLIC PANEL
ALT: 14 U/L (ref 0–44)
AST: 24 U/L (ref 15–41)
Albumin: 4.1 g/dL (ref 3.5–5.0)
Alkaline Phosphatase: 66 U/L (ref 38–126)
Anion gap: 8 (ref 5–15)
BUN: 28 mg/dL — ABNORMAL HIGH (ref 8–23)
CO2: 25 mmol/L (ref 22–32)
Calcium: 10.4 mg/dL — ABNORMAL HIGH (ref 8.9–10.3)
Chloride: 101 mmol/L (ref 98–111)
Creatinine, Ser: 1.67 mg/dL — ABNORMAL HIGH (ref 0.44–1.00)
GFR calc Af Amer: 34 mL/min — ABNORMAL LOW (ref >60)
GFR calc non Af Amer: 29 mL/min — ABNORMAL LOW (ref >60)
Glucose, Bld: 88 mg/dL (ref 70–99)
Potassium: 5.7 mmol/L — ABNORMAL HIGH (ref 3.5–5.1)
Sodium: 134 mmol/L — ABNORMAL LOW (ref 135–145)
Total Bilirubin: 0.8 mg/dL (ref 0.3–1.2)
Total Protein: 6.5 g/dL (ref 6.5–8.1)

## 2019-03-25 LAB — CBC WITH DIFFERENTIAL/PLATELET
Abs Immature Granulocytes: 0.02 10*3/uL (ref 0.00–0.07)
Basophils Absolute: 0.1 10*3/uL (ref 0.0–0.1)
Basophils Relative: 1 %
Eosinophils Absolute: 0 10*3/uL (ref 0.0–0.5)
Eosinophils Relative: 0 %
HCT: 34.8 % — ABNORMAL LOW (ref 36.0–46.0)
Hemoglobin: 11.7 g/dL — ABNORMAL LOW (ref 12.0–15.0)
Immature Granulocytes: 0 %
Lymphocytes Relative: 52 %
Lymphs Abs: 5.1 10*3/uL — ABNORMAL HIGH (ref 0.7–4.0)
MCH: 33.3 pg (ref 26.0–34.0)
MCHC: 33.6 g/dL (ref 30.0–36.0)
MCV: 99.1 fL (ref 80.0–100.0)
Monocytes Absolute: 0.7 10*3/uL (ref 0.1–1.0)
Monocytes Relative: 8 %
Neutro Abs: 3.8 10*3/uL (ref 1.7–7.7)
Neutrophils Relative %: 39 %
Platelets: 108 10*3/uL — ABNORMAL LOW (ref 150–400)
RBC: 3.51 MIL/uL — ABNORMAL LOW (ref 3.87–5.11)
RDW: 12.5 % (ref 11.5–15.5)
WBC: 9.7 10*3/uL (ref 4.0–10.5)
nRBC: 0 % (ref 0.0–0.2)

## 2019-03-25 NOTE — Telephone Encounter (Signed)
Called and given below message. She verbalized understanding. She will wait on call from Dr. York Cerise office and if they do not call she will them.

## 2019-03-25 NOTE — Telephone Encounter (Signed)
Please see lab results and note from office visit with Dr Alvy Bimler today

## 2019-03-25 NOTE — Progress Notes (Signed)
[facility] OFFICE PROGRESS NOTE  Patient Care Team: Kristie Cowman, MD as PCP - General (Family Medicine) Sherren Mocha, MD as PCP - Cardiology (Cardiology) Kristie Cowman, MD (Family Medicine) Terance Ice, MD (Inactive) (Cardiology)  ASSESSMENT & PLAN:  CLL (chronic lymphocytic leukemia) So far, she tolerated treatment well with expected side effects such as lymphocytosis, persistent thrombocytopenia, bruising, and mild diarrhea She will continue the same treatment without dose adjustment Due to stability of her blood work, I will see her again in 3 month for further follow-up I do not plan to repeat imaging study until resolution of lymphocytosis and further improvement of her platelet count  Thrombocytopenia She has stable thrombocytopenia, 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    Acute renal failure (ARF) ([place]) She has recent frequent urination After the patient has left, her labs came back showing evidence of acute renal failure and hyperkalemia, likely combination side effects of lisinopril and spironolactone I will get my nursing staff to reach out to her cardiologist office for instruction about medication adjustment In the meantime, I recommend her to hold off taking lisinopril and spironolactone over the next few days and to drink plenty of fluids   No orders of the defined types were placed in this encounter.   All questions were answered. The patient knows to call the clinic with any problems, questions or concerns. The total time spent in the appointment was 20 minutes encounter with patients including review of chart and various tests results, discussions about plan of care and coordination of care plan   Heath Lark, MD 03/25/2019 11:51 AM  INTERVAL HISTORY: Please see below for problem oriented  charting. She returns for further follow-up She complains of occasional dizziness Her blood pressure is noted to be normal but much lower compared to her baseline She noticed some frequent urination recently Her appetite is fair She continues to have bruises No recent infection, fever or chills The patient denies any recent signs or symptoms of bleeding such as spontaneous epistaxis, hematuria or hematochezia.  SUMMARY OF ONCOLOGIC HISTORY: Oncology History  CLL (chronic lymphocytic leukemia) ([place])  03/21/2011 Initial Diagnosis   CLL (chronic lymphocytic leukemia)   01/14/2014 - 01/31/2014 Hospital Admission   The patient was hospitalized and was found to severe aortic stenosis causing syncopal episode. She received treatment with steroids with minimum success and required platelet transfusion.   09/18/2014 Imaging   CT scan of the chest, abdomen and pelvis show lymphadenopathy but low level burden of disease.   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 There was no stenosis. Mild peri-valvular aortic insufficiency is noted   11/04/2014 -  Chemotherapy   She is started on Ibrutinib   06/17/2016 Imaging   1. No lymphadenopathy noted in the chest, abdomen or pelvis to suggest recurrent disease. 2. Aortic atherosclerosis, in addition to left main and 3 vessel coronary artery disease. Status post median sternotomy for CABG including LIMA to the LAD. 3. 3 mm nonobstructive calculus in the lower pole collecting system of the left kidney. Mild atrophy of the left kidney. 4. Mild colonic diverticulosis, without evidence of acute diverticulitis at this time. 5. Additional incidental findings, as above.   12/06/2018 Imaging   1. Increase in size and number of abdominal  retroperitoneal and small bowel mesenteric lymph nodes, consistent with recurrent CLL. 2.  Aortic  atherosclerosis (ICD10-170.0).   12/07/2018 Cancer Staging   Staging form: Chronic Lymphocytic Leukemia / Small Lymphocytic Lymphoma, AJCC 8th Edition - Clinical stage from 12/07/2018: Modified Rai Stage IV (Modified Rai risk: High, Binet: Stage B, Lugano: Stage II, Lymphocytosis: Absent, Adenopathy: Present, Organomegaly: Absent, Anemia: Absent, Thrombocytopenia: Present) - Signed by Heath Lark, MD on 12/07/2018   01/07/2019 -  Chemotherapy   The patient had acalabrutinib for chemotherapy treatment.       REVIEW OF SYSTEMS:   Constitutional: Denies fevers, chills or abnormal weight loss Eyes: Denies blurriness of vision Ears, nose, mouth, throat, and face: Denies mucositis or sore throat Respiratory: Denies cough, dyspnea or wheezes Cardiovascular: Denies palpitation, chest discomfort or lower extremity swelling Gastrointestinal:  Denies nausea, heartburn or change in bowel habits Skin: Denies abnormal skin rashes Lymphatics: Denies new lymphadenopathy  Neurological:Denies numbness, tingling or new weaknesses Behavioral/Psych: Mood is stable, no new changes  All other systems were reviewed with the patient and are negative.  I have reviewed the past medical history, past surgical history, social history and family history with the patient and they are unchanged from previous note.  ALLERGIES:  has No Known Allergies.  MEDICATIONS:  Current Outpatient Medications  Medication Sig Dispense Refill  . acalabrutinib (CALQUENCE) 100 MG capsule Take 1 capsule (100 mg total) by mouth 2 (two) times daily. 60 capsule 11  . acetaminophen (TYLENOL) 325 MG tablet Take 650 mg by mouth every 6 (six) hours as needed for mild pain or headache.     Marland Kitchen amLODipine (NORVASC) 5 MG tablet Take 1 tablet (5 mg total) by mouth daily. 90 tablet 3  . amoxicillin (AMOXIL) 500 MG capsule Take 4 capsules (2,000mg ) 1 hour prior to all dental appointments. 8 capsule 11  . aspirin 81 MG tablet Take 81 mg by mouth  daily.    Marland Kitchen atorvastatin (LIPITOR) 10 MG tablet Take 1 tablet (10 mg total) by mouth daily at 6 PM. 90 tablet 3  . lisinopril (ZESTRIL) 20 MG tablet Take 1 tablet (20 mg total) by mouth daily. 90 tablet 3  . metoprolol succinate (TOPROL-XL) 50 MG 24 hr tablet Take 1 tablet (50 mg total) by mouth daily. 90 tablet 3  . Multiple Vitamins-Minerals (HM MULTIVITAMIN ADULT GUMMY PO) Chew two gummies daily    . spironolactone (ALDACTONE) 25 MG tablet Take 1/2 (one-half) tablet by mouth once daily 45 tablet 2   No current facility-administered medications for this visit.    PHYSICAL EXAMINATION: ECOG PERFORMANCE STATUS: 1 - Symptomatic but completely ambulatory  Vitals:   03/25/19 1057  BP: 116/65  Pulse: (!) 57  Resp: 18  Temp: 98.2 F (36.8 C)  SpO2: 100%   Filed Weights   03/25/19 1057  Weight: 102 lb 9.6 oz (46.5 kg)    GENERAL:alert, no distress and comfortable SKIN: Noted skin bruises NEURO: alert & oriented x 3 with fluent speech, no focal motor/sensory deficits  LABORATORY DATA:  I have reviewed the data as listed    Component Value Date/Time   NA 134 (L) 03/25/2019 1031   NA 135 06/16/2018 1145   NA 137 12/18/2016 0916   K 5.7 (H) 03/25/2019 1031   K 4.4 12/18/2016 0916   CL 101 03/25/2019 1031   CL 105 05/20/2012 0823   CO2 25 03/25/2019 1031   CO2 25 12/18/2016 0916   GLUCOSE 88 03/25/2019 1031   GLUCOSE 88 12/18/2016 0916  GLUCOSE 70 05/20/2012 0823   BUN 28 (H) 03/25/2019 1031   BUN 25 06/16/2018 1145   BUN 14.0 12/18/2016 0916   CREATININE 1.67 (H) 03/25/2019 1031   CREATININE 1.0 12/18/2016 0916   CALCIUM 10.4 (H) 03/25/2019 1031   CALCIUM 9.9 12/18/2016 0916   PROT 6.5 03/25/2019 1031   PROT 6.1 (L) 12/18/2016 0916   ALBUMIN 4.1 03/25/2019 1031   ALBUMIN 3.9 12/18/2016 0916   AST 24 03/25/2019 1031   AST 28 12/18/2016 0916   ALT 14 03/25/2019 1031   ALT 16 12/18/2016 0916   ALKPHOS 66 03/25/2019 1031   ALKPHOS 54 12/18/2016 0916   BILITOT 0.8  03/25/2019 1031   BILITOT 1.03 12/18/2016 0916   GFRNONAA 29 (L) 03/25/2019 1031   GFRAA 34 (L) 03/25/2019 1031    No results found for: SPEP, UPEP  Lab Results  Component Value Date   WBC 9.7 03/25/2019   NEUTROABS 3.8 03/25/2019   HGB 11.7 (L) 03/25/2019   HCT 34.8 (L) 03/25/2019   MCV 99.1 03/25/2019   PLT 108 (L) 03/25/2019      Chemistry      Component Value Date/Time   NA 134 (L) 03/25/2019 1031   NA 135 06/16/2018 1145   NA 137 12/18/2016 0916   K 5.7 (H) 03/25/2019 1031   K 4.4 12/18/2016 0916   CL 101 03/25/2019 1031   CL 105 05/20/2012 0823   CO2 25 03/25/2019 1031   CO2 25 12/18/2016 0916   BUN 28 (H) 03/25/2019 1031   BUN 25 06/16/2018 1145   BUN 14.0 12/18/2016 0916   CREATININE 1.67 (H) 03/25/2019 1031   CREATININE 1.0 12/18/2016 0916      Component Value Date/Time   CALCIUM 10.4 (H) 03/25/2019 1031   CALCIUM 9.9 12/18/2016 0916   ALKPHOS 66 03/25/2019 1031   ALKPHOS 54 12/18/2016 0916   AST 24 03/25/2019 1031   AST 28 12/18/2016 0916   ALT 14 03/25/2019 1031   ALT 16 12/18/2016 0916   BILITOT 0.8 03/25/2019 1031   BILITOT 1.03 12/18/2016 0916

## 2019-03-25 NOTE — Telephone Encounter (Signed)
Called Dr. Antionette Char office with cardiology per Dr. Alvy Bimler. Talked with office staff. Given today's abnormal lab results. Dr. Alvy Bimler is asking if she needs medication adjustment? Cardiogy office will contact patient.  Tried calling patient and will attempt again later. Phone busy and mail box full. Will instruct per Dr. Alvy Bimler to hold lisinopril and spironolactone over the next few days. Drink more water and wait on instructions from Dr. York Cerise office.

## 2019-03-25 NOTE — Assessment & Plan Note (Signed)
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 

## 2019-03-25 NOTE — Assessment & Plan Note (Signed)
She has recent frequent urination After the patient has left, her labs came back showing evidence of acute renal failure and hyperkalemia, likely combination side effects of lisinopril and spironolactone I will get my nursing staff to reach out to her cardiologist office for instruction about medication adjustment In the meantime, I recommend her to hold off taking lisinopril and spironolactone over the next few days and to drink plenty of fluids

## 2019-03-25 NOTE — Assessment & Plan Note (Signed)
So far, she tolerated treatment well with expected side effects such as lymphocytosis, persistent thrombocytopenia, bruising, and mild diarrhea She will continue the same treatment without dose adjustment Due to stability of her blood work, I will see her again in 3 month for further follow-up I do not plan to repeat imaging study until resolution of lymphocytosis and further improvement of her platelet count

## 2019-03-25 NOTE — Telephone Encounter (Signed)
New message  Nurse from Dr. Alvy Bimler office is calling in to report to Dr. Burt Knack that patient has abnormal labs (Creatanine 1.67 and Potassium 5.7). States that patient's medication may need to be adjusted. Please give patient a call to discuss.Marland Kitchen

## 2019-03-28 ENCOUNTER — Telehealth: Payer: Self-pay | Admitting: Hematology and Oncology

## 2019-03-28 NOTE — Telephone Encounter (Signed)
Scheduled per 3/19 sch msg. Called and left a msg. Mailing printout

## 2019-03-29 NOTE — Telephone Encounter (Signed)
Would hold lisinopril and spironolactone. Increase amlodipine to 10 mg daily. Repeat labs one week. thanks

## 2019-03-29 NOTE — Telephone Encounter (Signed)
The patient reports Dr. Alvy Bimler instructed her to stop lisinopril and aldactone at recent visit. The patient was experiencing some dizziness and her BP was 116/65, so she will not increase amlodipine at this time. She will monitor BP and call if it increases consistently to >140/80. BMET scheduled 3/30. She was grateful for call and agrees with treatment plan.   Med list updated.

## 2019-03-29 NOTE — Telephone Encounter (Signed)
Patient was calling to discuss Lab results from her visit with Dr. Alvy Bimler

## 2019-03-31 MED FILL — CALQUENCE 100 MG CAPSULE: 100 | 30 days supply | Qty: 60 | Fill #3

## 2019-04-05 ENCOUNTER — Other Ambulatory Visit: Payer: Medicare Other

## 2019-04-05 ENCOUNTER — Other Ambulatory Visit: Payer: Self-pay

## 2019-04-05 DIAGNOSIS — I10 Essential (primary) hypertension: Secondary | ICD-10-CM

## 2019-04-05 LAB — BASIC METABOLIC PANEL
BUN/Creatinine Ratio: 19 (ref 12–28)
BUN: 21 mg/dL (ref 8–27)
CO2: 23 mmol/L (ref 20–29)
Calcium: 9.6 mg/dL (ref 8.7–10.3)
Chloride: 99 mmol/L (ref 96–106)
Creatinine, Ser: 1.11 mg/dL — ABNORMAL HIGH (ref 0.57–1.00)
GFR calc Af Amer: 55 mL/min/{1.73_m2} — ABNORMAL LOW (ref >59)
GFR calc non Af Amer: 48 mL/min/{1.73_m2} — ABNORMAL LOW (ref >59)
Glucose: 103 mg/dL — ABNORMAL HIGH (ref 65–99)
Potassium: 4 mmol/L (ref 3.5–5.2)
Sodium: 137 mmol/L (ref 134–144)

## 2019-04-28 MED FILL — CALQUENCE 100 MG CAPSULE: 100 | 30 days supply | Qty: 60 | Fill #4

## 2019-05-31 MED FILL — CALQUENCE 100 MG CAPSULE: 100 | 30 days supply | Qty: 60 | Fill #5

## 2019-06-01 ENCOUNTER — Telehealth: Payer: Self-pay

## 2019-06-01 NOTE — Telephone Encounter (Signed)
Oral Oncology Patient Advocate Encounter  Patient has been approved for copay assistance with [employer] (TAF).  [employer] will cover all copayment expenses for [employer] for the remainder of the calendar year.    The billing information is as follows and has been shared with [facility].   Member ID: [id number] Group ID: [id number] PCN: [id number] BIN: [id number] Eligibility Dates: 01/07/19 to 01/06/20  Fund: [place] Patient [place] Phone [phone] Fax [phone] 06/01/2019 10:34 AM  06/01/2019 10:33 AM

## 2019-06-17 ENCOUNTER — Inpatient Hospital Stay: Payer: Medicare Other | Attending: Hematology and Oncology | Admitting: Hematology and Oncology

## 2019-06-17 ENCOUNTER — Encounter: Payer: Self-pay | Admitting: Hematology and Oncology

## 2019-06-17 ENCOUNTER — Other Ambulatory Visit: Payer: Self-pay

## 2019-06-17 ENCOUNTER — Inpatient Hospital Stay: Payer: Medicare Other

## 2019-06-17 DIAGNOSIS — C9112 Chronic lymphocytic leukemia of B-cell type in relapse: Secondary | ICD-10-CM | POA: Diagnosis not present

## 2019-06-17 DIAGNOSIS — R233 Spontaneous ecchymoses: Secondary | ICD-10-CM

## 2019-06-17 DIAGNOSIS — Z79899 Other long term (current) drug therapy: Secondary | ICD-10-CM | POA: Diagnosis not present

## 2019-06-17 DIAGNOSIS — Z7982 Long term (current) use of aspirin: Secondary | ICD-10-CM | POA: Insufficient documentation

## 2019-06-17 DIAGNOSIS — I2510 Atherosclerotic heart disease of native coronary artery without angina pectoris: Secondary | ICD-10-CM | POA: Diagnosis not present

## 2019-06-17 DIAGNOSIS — N183 Chronic kidney disease, stage 3 unspecified: Secondary | ICD-10-CM | POA: Diagnosis not present

## 2019-06-17 DIAGNOSIS — R238 Other skin changes: Secondary | ICD-10-CM | POA: Diagnosis not present

## 2019-06-17 DIAGNOSIS — D696 Thrombocytopenia, unspecified: Secondary | ICD-10-CM | POA: Diagnosis not present

## 2019-06-17 DIAGNOSIS — C9110 Chronic lymphocytic leukemia of B-cell type not having achieved remission: Secondary | ICD-10-CM | POA: Diagnosis not present

## 2019-06-17 LAB — CBC WITH DIFFERENTIAL/PLATELET
Abs Immature Granulocytes: 0.01 10*3/uL (ref 0.00–0.07)
Basophils Absolute: 0.1 10*3/uL (ref 0.0–0.1)
Basophils Relative: 1 %
Eosinophils Absolute: 0 10*3/uL (ref 0.0–0.5)
Eosinophils Relative: 1 %
HCT: 39.8 % (ref 36.0–46.0)
Hemoglobin: 13.3 g/dL (ref 12.0–15.0)
Immature Granulocytes: 0 %
Lymphocytes Relative: 58 %
Lymphs Abs: 3.8 10*3/uL (ref 0.7–4.0)
MCH: 32.8 pg (ref 26.0–34.0)
MCHC: 33.4 g/dL (ref 30.0–36.0)
MCV: 98 fL (ref 80.0–100.0)
Monocytes Absolute: 0.6 10*3/uL (ref 0.1–1.0)
Monocytes Relative: 10 %
Neutro Abs: 1.9 10*3/uL (ref 1.7–7.7)
Neutrophils Relative %: 30 %
Platelets: 138 10*3/uL — ABNORMAL LOW (ref 150–400)
RBC: 4.06 MIL/uL (ref 3.87–5.11)
RDW: 11.6 % (ref 11.5–15.5)
WBC: 6.4 10*3/uL (ref 4.0–10.5)
nRBC: 0 % (ref 0.0–0.2)

## 2019-06-17 LAB — COMPREHENSIVE METABOLIC PANEL
ALT: 17 U/L (ref 0–44)
AST: 23 U/L (ref 15–41)
Albumin: 3.8 g/dL (ref 3.5–5.0)
Alkaline Phosphatase: 72 U/L (ref 38–126)
Anion gap: 8 (ref 5–15)
BUN: 17 mg/dL (ref 8–23)
CO2: 28 mmol/L (ref 22–32)
Calcium: 9.8 mg/dL (ref 8.9–10.3)
Chloride: 104 mmol/L (ref 98–111)
Creatinine, Ser: 1.45 mg/dL — ABNORMAL HIGH (ref 0.44–1.00)
GFR calc Af Amer: 40 mL/min — ABNORMAL LOW (ref >60)
GFR calc non Af Amer: 34 mL/min — ABNORMAL LOW (ref >60)
Glucose, Bld: 81 mg/dL (ref 70–99)
Potassium: 4 mmol/L (ref 3.5–5.1)
Sodium: 140 mmol/L (ref 135–145)
Total Bilirubin: 0.7 mg/dL (ref 0.3–1.2)
Total Protein: 6 g/dL — ABNORMAL LOW (ref 6.5–8.1)

## 2019-06-17 NOTE — Progress Notes (Signed)
[facility] OFFICE PROGRESS NOTE  Patient Care Team: Kristie Cowman, MD as PCP - General (Family Medicine) Sherren Mocha, MD as PCP - Cardiology (Cardiology) Kristie Cowman, MD (Family Medicine) Terance Ice, MD (Inactive) (Cardiology)  ASSESSMENT & PLAN:  CLL (chronic lymphocytic leukemia) So far, she tolerated treatment well with expected side effects such as persistent thrombocytopenia and bruising She will continue the same treatment without dose adjustment Due to stability of her blood work, I will see her again in 3 month for further follow-up  Easy bruising She has chronic easy bruising due to aspirin therapy and chemotherapy I recommend close monitoring for now  CKD (chronic kidney disease), stage III She has mild intermittent acute on chronic renal failure We discussed the importance of adequate hydration   No orders of the defined types were placed in this encounter.   All questions were answered. The patient knows to call the clinic with any problems, questions or concerns. The total time spent in the appointment was 15 minutes encounter with patients including review of chart and various tests results, discussions about plan of care and coordination of care plan   Heath Lark, MD 06/17/2019 3:57 PM  INTERVAL HISTORY: Please see below for problem oriented charting. She returns for further follow-up on CLL She is doing well except for bruising No other side effects of treatment so far No recent chest pain or shortness of breath  SUMMARY OF ONCOLOGIC HISTORY: Oncology History  CLL (chronic lymphocytic leukemia) ([place])  03/21/2011 Initial Diagnosis   CLL (chronic lymphocytic leukemia)   01/14/2014 - 01/31/2014 Hospital Admission   The patient was hospitalized and was found to severe aortic stenosis causing syncopal episode. She received treatment with steroids with minimum success and required platelet transfusion.   09/18/2014 Imaging   CT scan  of the chest, abdomen and pelvis show lymphadenopathy but low level burden of disease.   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 There was no stenosis. Mild peri-valvular aortic insufficiency is noted   11/04/2014 -  Chemotherapy   She is started on Ibrutinib   06/17/2016 Imaging   1. No lymphadenopathy noted in the chest, abdomen or pelvis to suggest recurrent disease. 2. Aortic atherosclerosis, in addition to left main and 3 vessel coronary artery disease. Status post median sternotomy for CABG including LIMA to the LAD. 3. 3 mm nonobstructive calculus in the lower pole collecting system of the left kidney. Mild atrophy of the left kidney. 4. Mild colonic diverticulosis, without evidence of acute diverticulitis at this time. 5. Additional incidental findings, as above.   12/06/2018 Imaging   1. Increase in size and number of abdominal retroperitoneal and small bowel mesenteric lymph nodes, consistent with recurrent CLL. 2.  Aortic atherosclerosis (ICD10-170.0).   12/07/2018 Cancer Staging   Staging form: Chronic Lymphocytic Leukemia / Small Lymphocytic Lymphoma, AJCC 8th Edition - Clinical stage from 12/07/2018: Modified Rai Stage IV (Modified Rai risk: High, Binet: Stage B, Lugano: Stage II, Lymphocytosis: Absent, Adenopathy: Present, Organomegaly: Absent, Anemia: Absent, Thrombocytopenia: Present) - Signed by Heath Lark, MD on 12/07/2018   01/07/2019 -  Chemotherapy   The patient had acalabrutinib for chemotherapy treatment.       REVIEW OF SYSTEMS:   Constitutional: Denies fevers, chills or abnormal weight loss Eyes: Denies blurriness of vision Ears, nose, mouth, throat, and face: Denies mucositis or sore throat Respiratory: Denies cough, dyspnea  or wheezes Cardiovascular: Denies palpitation, chest discomfort or lower extremity swelling Gastrointestinal:  Denies  nausea, heartburn or change in bowel habits Skin: Denies abnormal skin rashes Lymphatics: Denies new lymphadenopathy o Neurological:Denies numbness, tingling or new weaknesses Behavioral/Psych: Mood is stable, no new changes  All other systems were reviewed with the patient and are negative.  I have reviewed the past medical history, past surgical history, social history and family history with the patient and they are unchanged from previous note.  ALLERGIES:  has No Known Allergies.  MEDICATIONS:  Current Outpatient Medications  Medication Sig Dispense Refill  . acalabrutinib (CALQUENCE) 100 MG capsule Take 1 capsule (100 mg total) by mouth 2 (two) times daily. 60 capsule 11  . acetaminophen (TYLENOL) 325 MG tablet Take 650 mg by mouth every 6 (six) hours as needed for mild pain or headache.     Marland Kitchen amLODipine (NORVASC) 5 MG tablet Take 1 tablet (5 mg total) by mouth daily. 90 tablet 3  . amoxicillin (AMOXIL) 500 MG capsule Take 4 capsules (2,000mg ) 1 hour prior to all dental appointments. 8 capsule 11  . aspirin 81 MG tablet Take 81 mg by mouth daily.    Marland Kitchen atorvastatin (LIPITOR) 10 MG tablet Take 1 tablet (10 mg total) by mouth daily at 6 PM. 90 tablet 3  . metoprolol succinate (TOPROL-XL) 50 MG 24 hr tablet Take 1 tablet (50 mg total) by mouth daily. 90 tablet 3  . Multiple Vitamins-Minerals (HM MULTIVITAMIN ADULT GUMMY PO) Chew two gummies daily     No current facility-administered medications for this visit.    PHYSICAL EXAMINATION: ECOG PERFORMANCE STATUS: 0 - Asymptomatic  Vitals:   06/17/19 0914  BP: (!) 183/52  Pulse: 67  Resp: 18  Temp: 98.4 F (36.9 C)  SpO2: 100%   Filed Weights   06/17/19 0914  Weight: 102 lb 6.4 oz (46.4 kg)    GENERAL:alert, no distress and comfortable  LABORATORY DATA:  I have reviewed the data as listed    Component Value Date/Time   NA 140 06/17/2019 0845   NA 137 04/05/2019 0829   NA 137 12/18/2016 0916   K 4.0 06/17/2019 0845    K 4.4 12/18/2016 0916   CL 104 06/17/2019 0845   CL 105 05/20/2012 0823   CO2 28 06/17/2019 0845   CO2 25 12/18/2016 0916   GLUCOSE 81 06/17/2019 0845   GLUCOSE 88 12/18/2016 0916   GLUCOSE 70 05/20/2012 0823   BUN 17 06/17/2019 0845   BUN 21 04/05/2019 0829   BUN 14.0 12/18/2016 0916   CREATININE 1.45 (H) 06/17/2019 0845   CREATININE 1.0 12/18/2016 0916   CALCIUM 9.8 06/17/2019 0845   CALCIUM 9.9 12/18/2016 0916   PROT 6.0 (L) 06/17/2019 0845   PROT 6.1 (L) 12/18/2016 0916   ALBUMIN 3.8 06/17/2019 0845   ALBUMIN 3.9 12/18/2016 0916   AST 23 06/17/2019 0845   AST 28 12/18/2016 0916   ALT 17 06/17/2019 0845   ALT 16 12/18/2016 0916   ALKPHOS 72 06/17/2019 0845   ALKPHOS 54 12/18/2016 0916   BILITOT 0.7 06/17/2019 0845   BILITOT 1.03 12/18/2016 0916   GFRNONAA 34 (L) 06/17/2019 0845   GFRAA 40 (L) 06/17/2019 0845    No results found for: SPEP, UPEP  Lab Results  Component Value Date   WBC 6.4 06/17/2019   NEUTROABS 1.9 06/17/2019   HGB 13.3 06/17/2019   HCT 39.8 06/17/2019   MCV 98.0 06/17/2019   PLT 138 (L) 06/17/2019  Chemistry      Component Value Date/Time   NA 140 06/17/2019 0845   NA 137 04/05/2019 0829   NA 137 12/18/2016 0916   K 4.0 06/17/2019 0845   K 4.4 12/18/2016 0916   CL 104 06/17/2019 0845   CL 105 05/20/2012 0823   CO2 28 06/17/2019 0845   CO2 25 12/18/2016 0916   BUN 17 06/17/2019 0845   BUN 21 04/05/2019 0829   BUN 14.0 12/18/2016 0916   CREATININE 1.45 (H) 06/17/2019 0845   CREATININE 1.0 12/18/2016 0916      Component Value Date/Time   CALCIUM 9.8 06/17/2019 0845   CALCIUM 9.9 12/18/2016 0916   ALKPHOS 72 06/17/2019 0845   ALKPHOS 54 12/18/2016 0916   AST 23 06/17/2019 0845   AST 28 12/18/2016 0916   ALT 17 06/17/2019 0845   ALT 16 12/18/2016 0916   BILITOT 0.7 06/17/2019 0845   BILITOT 1.03 12/18/2016 0916

## 2019-06-17 NOTE — Assessment & Plan Note (Signed)
She has mild intermittent acute on chronic renal failure We discussed the importance of adequate hydration 

## 2019-06-17 NOTE — Assessment & Plan Note (Signed)
So far, she tolerated treatment well with expected side effects such as persistent thrombocytopenia and bruising She will continue the same treatment without dose adjustment Due to stability of her blood work, I will see her again in 3 month for further follow-up

## 2019-06-17 NOTE — Assessment & Plan Note (Signed)
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

## 2019-06-30 MED FILL — CALQUENCE 100 MG CAPSULE: 100 | 30 days supply | Qty: 60 | Fill #6

## 2019-07-25 MED FILL — CALQUENCE 100 MG CAPSULE: 100 | 30 days supply | Qty: 60 | Fill #7

## 2019-08-29 MED FILL — CALQUENCE 100 MG CAPSULE: 100 | 30 days supply | Qty: 60 | Fill #8

## 2019-09-27 MED FILL — CALQUENCE 100 MG CAPSULE: 100 | 30 days supply | Qty: 60 | Fill #9

## 2019-10-24 MED FILL — CALQUENCE 100 MG CAPSULE: 100 | 30 days supply | Qty: 60 | Fill #10

## 2019-11-15 ENCOUNTER — Telehealth: Payer: Self-pay

## 2019-11-15 NOTE — Telephone Encounter (Signed)
She called and left a message to call her.  Called back. She is asking about medication refill next month. She has not been seen in the office since June. She thought someone would call with appt. Told her I would call her back.  Schedule appt?

## 2019-11-15 NOTE — Telephone Encounter (Signed)
Called and scheduled appt for 11/16. She is aware of appt times.

## 2019-11-15 NOTE — Telephone Encounter (Signed)
Pls schedule appt next week, labs and see me 20 mins

## 2019-11-22 ENCOUNTER — Inpatient Hospital Stay: Payer: Medicare Other | Admitting: Hematology and Oncology

## 2019-11-22 ENCOUNTER — Other Ambulatory Visit: Payer: Self-pay

## 2019-11-22 ENCOUNTER — Encounter: Payer: Self-pay | Admitting: Hematology and Oncology

## 2019-11-22 ENCOUNTER — Inpatient Hospital Stay: Payer: Medicare Other | Attending: Hematology and Oncology

## 2019-11-22 DIAGNOSIS — I10 Essential (primary) hypertension: Secondary | ICD-10-CM | POA: Diagnosis not present

## 2019-11-22 DIAGNOSIS — I2510 Atherosclerotic heart disease of native coronary artery without angina pectoris: Secondary | ICD-10-CM | POA: Insufficient documentation

## 2019-11-22 DIAGNOSIS — Z79899 Other long term (current) drug therapy: Secondary | ICD-10-CM | POA: Insufficient documentation

## 2019-11-22 DIAGNOSIS — N179 Acute kidney failure, unspecified: Secondary | ICD-10-CM | POA: Diagnosis not present

## 2019-11-22 DIAGNOSIS — C9110 Chronic lymphocytic leukemia of B-cell type not having achieved remission: Secondary | ICD-10-CM | POA: Insufficient documentation

## 2019-11-22 DIAGNOSIS — Z7982 Long term (current) use of aspirin: Secondary | ICD-10-CM | POA: Diagnosis not present

## 2019-11-22 DIAGNOSIS — N183 Chronic kidney disease, stage 3 unspecified: Secondary | ICD-10-CM

## 2019-11-22 DIAGNOSIS — D696 Thrombocytopenia, unspecified: Secondary | ICD-10-CM | POA: Diagnosis not present

## 2019-11-22 LAB — COMPREHENSIVE METABOLIC PANEL
ALT: 14 U/L (ref 0–44)
AST: 30 U/L (ref 15–41)
Albumin: 4.4 g/dL (ref 3.5–5.0)
Alkaline Phosphatase: 59 U/L (ref 38–126)
Anion gap: 9 (ref 5–15)
BUN: 16 mg/dL (ref 8–23)
CO2: 27 mmol/L (ref 22–32)
Calcium: 10.7 mg/dL — ABNORMAL HIGH (ref 8.9–10.3)
Chloride: 102 mmol/L (ref 98–111)
Creatinine, Ser: 1.08 mg/dL — ABNORMAL HIGH (ref 0.44–1.00)
GFR, Estimated: 53 mL/min — ABNORMAL LOW (ref >60)
Glucose, Bld: 82 mg/dL (ref 70–99)
Potassium: 5 mmol/L (ref 3.5–5.1)
Sodium: 138 mmol/L (ref 135–145)
Total Bilirubin: 1.2 mg/dL (ref 0.3–1.2)
Total Protein: 6.9 g/dL (ref 6.5–8.1)

## 2019-11-22 LAB — CBC WITH DIFFERENTIAL/PLATELET
Abs Immature Granulocytes: 0.01 10*3/uL (ref 0.00–0.07)
Basophils Absolute: 0 10*3/uL (ref 0.0–0.1)
Basophils Relative: 1 %
Eosinophils Absolute: 0 10*3/uL (ref 0.0–0.5)
Eosinophils Relative: 0 %
HCT: 41.1 % (ref 36.0–46.0)
Hemoglobin: 14.4 g/dL (ref 12.0–15.0)
Immature Granulocytes: 0 %
Lymphocytes Relative: 61 %
Lymphs Abs: 4.7 10*3/uL — ABNORMAL HIGH (ref 0.7–4.0)
MCH: 33.4 pg (ref 26.0–34.0)
MCHC: 35 g/dL (ref 30.0–36.0)
MCV: 95.4 fL (ref 80.0–100.0)
Monocytes Absolute: 0.6 10*3/uL (ref 0.1–1.0)
Monocytes Relative: 8 %
Neutro Abs: 2.3 10*3/uL (ref 1.7–7.7)
Neutrophils Relative %: 30 %
Platelets: 136 10*3/uL — ABNORMAL LOW (ref 150–400)
RBC: 4.31 MIL/uL (ref 3.87–5.11)
RDW: 11.4 % — ABNORMAL LOW (ref 11.5–15.5)
WBC: 7.7 10*3/uL (ref 4.0–10.5)
nRBC: 0 % (ref 0.0–0.2)

## 2019-11-22 MED FILL — CALQUENCE 100 MG CAPSULE: 100 | 30 days supply | Qty: 60 | Fill #11

## 2019-11-22 NOTE — Assessment & Plan Note (Signed)
So far, she tolerated treatment well with expected side effects such as persistent thrombocytopenia and bruising She will continue the same treatment without dose adjustment Due to stability of her blood work, I will see her again in 4 month for further follow-up

## 2019-11-22 NOTE — Assessment & Plan Note (Signed)
She has mild intermittent acute on chronic renal failure We discussed the importance of adequate hydration

## 2019-11-22 NOTE — Assessment & Plan Note (Signed)
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 

## 2019-11-22 NOTE — Progress Notes (Signed)
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    CKD (chronic kidney disease), stage III She has mild intermittent acute on chronic renal failure We discussed the importance of adequate hydration  Essential hypertension She has poorly controlled hypertension, intermittently exacerbated by whitecoat hypertension She is not symptomatic We discussed the importance of close blood Pressure monitoring and follow-up with her cardiologist for medication adjustment if needed   No orders of the defined types were placed in this encounter.   All questions were answered. The patient knows to call the clinic with any problems, questions or concerns. The total time spent in the appointment was 20 minutes encounter with patients including review of chart and various tests results, discussions about plan of care and coordination of care plan   Heath Lark, MD 11/22/2019 2:35 PM  INTERVAL HISTORY: Please see below for problem oriented charting. She returns for further follow-up She is consistently taking Calquence as directed She had no problems getting her medications refilled She noticed some easy bruises The patient denies any recent signs or  symptoms of bleeding such as spontaneous epistaxis, hematuria or hematochezia. No recent infection, fever or chills According to the patient, her blood pressure at home is within normal limits  SUMMARY OF ONCOLOGIC HISTORY: Oncology History  CLL (chronic lymphocytic leukemia) ([place])  03/21/2011 Initial Diagnosis   CLL (chronic lymphocytic leukemia)   01/14/2014 - 01/31/2014 Hospital Admission   The patient was hospitalized and was found to severe aortic stenosis causing syncopal episode. She received treatment with steroids with minimum success and required platelet transfusion.   09/18/2014 Imaging   CT scan of the chest, abdomen and pelvis show lymphadenopathy but low level burden of disease.   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 There was no stenosis. Mild peri-valvular aortic insufficiency is noted   11/04/2014 -  Chemotherapy   She is started on Ibrutinib   06/17/2016 Imaging   1. No lymphadenopathy noted in the chest, abdomen or pelvis to suggest recurrent disease. 2. Aortic atherosclerosis, in addition to left main and 3 vessel coronary artery disease. Status post median sternotomy for CABG including LIMA to the LAD. 3. 3 mm nonobstructive calculus in the lower pole collecting system of the left kidney. Mild atrophy of the left kidney. 4. Mild colonic diverticulosis, without evidence of acute diverticulitis at this time. 5. Additional incidental findings, as above.   12/06/2018 Imaging   1. Increase in size and number of abdominal retroperitoneal and small bowel mesenteric lymph nodes, consistent with recurrent CLL. 2.  Aortic atherosclerosis (ICD10-170.0).   12/07/2018 Cancer Staging   Staging form: Chronic Lymphocytic Leukemia / Small Lymphocytic Lymphoma, AJCC 8th Edition - Clinical stage from 12/07/2018: Modified Rai Stage  IV (Modified Rai risk: High, Binet:  Stage B, Lugano: Stage II, Lymphocytosis: Absent, Adenopathy: Present, Organomegaly: Absent, Anemia: Absent, Thrombocytopenia: Present) - Signed by Heath Lark, MD on 12/07/2018   01/07/2019 -  Chemotherapy   The patient had acalabrutinib for chemotherapy treatment.       REVIEW OF SYSTEMS:   Constitutional: Denies fevers, chills or abnormal weight loss Eyes: Denies blurriness of vision Ears, nose, mouth, throat, and face: Denies mucositis or sore throat Respiratory: Denies cough, dyspnea or wheezes Cardiovascular: Denies palpitation, chest discomfort or lower extremity swelling Gastrointestinal:  Denies nausea, heartburn or change in bowel habits Skin: Denies abnormal skin rashes Lymphatics: Denies new lymphadenopathy  Neurological:Denies numbness, tingling or new weaknesses Behavioral/Psych: Mood is stable, no new changes  All other systems were reviewed with the patient and are negative.  I have reviewed the past medical history, past surgical history, social history and family history with the patient and they are unchanged from previous note.  ALLERGIES:  has No Known Allergies.  MEDICATIONS:  Current Outpatient Medications  Medication Sig Dispense Refill  . acalabrutinib (CALQUENCE) 100 MG capsule Take 1 capsule (100 mg total) by mouth 2 (two) times daily. 60 capsule 11  . acetaminophen (TYLENOL) 325 MG tablet Take 650 mg by mouth every 6 (six) hours as needed for mild pain or headache.     Marland Kitchen amLODipine (NORVASC) 5 MG tablet Take 1 tablet (5 mg total) by mouth daily. 90 tablet 3  . amoxicillin (AMOXIL) 500 MG capsule Take 4 capsules (2,000mg ) 1 hour prior to all dental appointments. 8 capsule 11  . aspirin 81 MG tablet Take 81 mg by mouth daily.    Marland Kitchen atorvastatin (LIPITOR) 10 MG tablet Take 1 tablet (10 mg total) by mouth daily at 6 PM. 90 tablet 3  . metoprolol succinate (TOPROL-XL) 50 MG 24 hr tablet Take 1 tablet (50 mg total) by mouth daily. 90 tablet 3  . Multiple  Vitamins-Minerals (HM MULTIVITAMIN ADULT GUMMY PO) Chew two gummies daily     No current facility-administered medications for this visit.    PHYSICAL EXAMINATION: ECOG PERFORMANCE STATUS: 1 - Symptomatic but completely ambulatory  Vitals:   11/22/19 1131  BP: (!) 189/46  Pulse: 62  Resp: 18  Temp: (!) 97.4 F (36.3 C)  SpO2: 100%   Filed Weights   11/22/19 1131  Weight: 98 lb (44.5 kg)    GENERAL:alert, no distress and comfortable SKIN: Noted minor skin bruises NEURO: alert & oriented x 3 with fluent speech, no focal motor/sensory deficits  LABORATORY DATA:  I have reviewed the data as listed    Component Value Date/Time   NA 138 11/22/2019 1036   NA 137 04/05/2019 0829   NA 137 12/18/2016 0916   K 5.0 11/22/2019 1036   K 4.4 12/18/2016 0916   CL 102 11/22/2019 1036   CL 105 05/20/2012 0823   CO2 27 11/22/2019 1036   CO2 25 12/18/2016 0916   GLUCOSE 82 11/22/2019 1036   GLUCOSE 88 12/18/2016 0916   GLUCOSE 70 05/20/2012 0823   BUN 16 11/22/2019 1036   BUN 21 04/05/2019 0829   BUN 14.0 12/18/2016 0916   CREATININE 1.08 (H) 11/22/2019 1036   CREATININE 1.0 12/18/2016 0916   CALCIUM 10.7 (H) 11/22/2019 1036   CALCIUM 9.9 12/18/2016 0916   PROT 6.9 11/22/2019 1036   PROT 6.1 (L) 12/18/2016 0916   ALBUMIN 4.4 11/22/2019 1036   ALBUMIN 3.9 12/18/2016 0916   AST 30 11/22/2019 1036  AST 28 12/18/2016 0916   ALT 14 11/22/2019 1036   ALT 16 12/18/2016 0916   ALKPHOS 59 11/22/2019 1036   ALKPHOS 54 12/18/2016 0916   BILITOT 1.2 11/22/2019 1036   BILITOT 1.03 12/18/2016 0916   GFRNONAA 53 (L) 11/22/2019 1036   GFRAA 40 (L) 06/17/2019 0845    No results found for: SPEP, UPEP  Lab Results  Component Value Date   WBC 7.7 11/22/2019   NEUTROABS 2.3 11/22/2019   HGB 14.4 11/22/2019   HCT 41.1 11/22/2019   MCV 95.4 11/22/2019   PLT 136 (L) 11/22/2019      Chemistry      Component Value Date/Time   NA 138 11/22/2019 1036   NA 137 04/05/2019 0829   NA  137 12/18/2016 0916   K 5.0 11/22/2019 1036   K 4.4 12/18/2016 0916   CL 102 11/22/2019 1036   CL 105 05/20/2012 0823   CO2 27 11/22/2019 1036   CO2 25 12/18/2016 0916   BUN 16 11/22/2019 1036   BUN 21 04/05/2019 0829   BUN 14.0 12/18/2016 0916   CREATININE 1.08 (H) 11/22/2019 1036   CREATININE 1.0 12/18/2016 0916      Component Value Date/Time   CALCIUM 10.7 (H) 11/22/2019 1036   CALCIUM 9.9 12/18/2016 0916   ALKPHOS 59 11/22/2019 1036   ALKPHOS 54 12/18/2016 0916   AST 30 11/22/2019 1036   AST 28 12/18/2016 0916   ALT 14 11/22/2019 1036   ALT 16 12/18/2016 0916   BILITOT 1.2 11/22/2019 1036   BILITOT 1.03 12/18/2016 0916

## 2019-11-22 NOTE — Assessment & Plan Note (Signed)
She has poorly controlled hypertension, intermittently exacerbated by whitecoat hypertension She is not symptomatic We discussed the importance of close blood Pressure monitoring and follow-up with her cardiologist for medication adjustment if needed

## 2019-11-24 ENCOUNTER — Telehealth: Payer: Self-pay

## 2019-11-24 NOTE — Telephone Encounter (Signed)
Oral Oncology Patient Advocate Encounter  Was successful in securing patient a $8000 grant from [facility] to provide copayment coverage for Calquence.  This will keep the out of pocket expense at $0.     [employer] ID: [id number]  I have spoken with the patient.   The billing information is as follows and has been shared with [place]: [id number] PCN: [id number] Member ID: [id number] Group ID: [id number] Dates of Eligibility: 11/08/19 through 11/06/20  Fund:  [place] Patient [place] Phone [phone] Fax [phone] 11/24/2019 9:49 AM

## 2019-12-14 ENCOUNTER — Other Ambulatory Visit: Payer: Self-pay | Admitting: Cardiovascular Disease

## 2019-12-19 ENCOUNTER — Other Ambulatory Visit: Payer: Self-pay | Admitting: Hematology and Oncology

## 2019-12-19 MED FILL — CALQUENCE 100 MG CAPSULE: 100 | 30 days supply | Qty: 60 | Fill #0

## 2020-01-25 MED FILL — CALQUENCE 100 MG CAPSULE: 100 | 30 days supply | Qty: 60 | Fill #1

## 2020-01-31 NOTE — Progress Notes (Signed)
Virtual Visit via Telephone Note   This visit type was conducted due to national recommendations for restrictions regarding the COVID-19 Pandemic (e.g. social distancing) in an effort to limit this patient's exposure and mitigate transmission in our community.  Due to her co-morbid illnesses, this patient is at least at moderate risk for complications without adequate follow up.  This format is felt to be most appropriate for this patient at this time.  The patient did not have access to video technology/had technical difficulties with video requiring transitioning to audio format only (telephone).  All issues noted in this document were discussed and addressed.  No physical exam could be performed with this format.  Please refer to the patient's chart for her  consent to telehealth for [facility].    Date:  02/01/2020   ID:  Nicole Mercado, DOB 05/05/1940, MRN [id number] The patient was identified using 2 identifiers.  Patient Location: Home Provider Location: Office/Clinic  PCP:  Kristie Cowman, MD  Cardiologist:  Sherren Mocha, MD   Electrophysiologist:  None   Evaluation Performed:  Follow-Up Visit  Chief Complaint:  Follow-up (CAD, history of AVR)    Patient Profile: Nicole Mercado is a 80 y.o. female with:  Chronic Lymphocytic Leukemia    Chronic thrombocytopenia  Coronary artery disease   Aortic stenosis  S/p CABG+AVR in 1/16  Post op AFib   Hypertension   Carotid artery disease s/p L CEA in 6/13  Hyperlipidemia   Aortic atherosclerosis (CT 11/2018)  Prior CV Studies:  Echocardiogram 12/16/2018 EF 60-65, GRII DD, mildly reduced RVSF, mild LAE, s/p bioprosthetic AVR functioning normally with mean gradient 6 and mild AI, trivial MR, trivial TR, RVSP 38.3  Carotid US 10/09/2017 Bilateral ICA 1-39; patent left CEA  Echocardiogram 12/26/2016 Mild LVH, EF 17-51, grade 2 diastolic dysfunction, well-seated AVR with peak and mean gradients 23 and 10, mild  paravalvular AI, MAC, mild MR, mild BAE, PASP 36  Echo 09/24/15 Mild concentric LVH, EF 60-65%, normal wall motion, grade 2 diastolic dysfunction, bioprostheticAVR with mild AI, mean gradient 12 mmHg, mild MR, mild LAE, PASP 35 mmHg  Event Monitor 08/23/15  Rare PACs  No significant bradycardia, no pauses, no atrial fibrillation  No adverse arrhythmias identified  Carotid US 8/17 L CEA patent, R 1-39%  Echo 9/16 EF 55-60%, normal wall motion, grade 1 diastolic dysfunction, bioprostheticAVR, mild perivalvular AI, mean gradient 9 mmHg, PASP 23 mmHg  LHC (01/13/14):  dLM 70 extending into oLAD, mLAD 40-50, pD1 80, pD2 70, ostial/prox OM1 70  Echocardiogram (01/11/14):  Mild LVH, EF 60-65%, Gr 1 DD, possible bicuspid AV, severe AS (mean 61 mmHg, peak 104 mmHg), mild MVP of post leaflet, mild MR, PASP 33 mmHg.  Carotid US (8/15):  R <40%, prox R ECA occluded, L CEA ok with velocities suggesting <40%  Carotid US (1/16):  Bilateral ICA 1-39%  Nuclear stress test (4/13): Normal perfusion, EF 74%  History of Present Illness:   Nicole Mercado was last seen by Dr. Burt Knack in 12/2018.  She is seen for follow-up.  She is overall doing well.  She has not had chest pain, shortness of breath, syncope, orthopnea, leg edema.  Her blood pressure tends to go up and down.  She feels that it is typically elevated when she comes to clinic appointments.  She has had more bruising since she has been on Calquence for her CLL.  Recent platelet count was in the 130s.  She has not had any other bleeding  issues.   Past Medical History:  Diagnosis Date  . Aortic stenosis    a. Echocardiogram (01/11/14):  Mild LVH, EF 60-65%, Gr 1 DD, possible bicuspid AV, severe AS (mean 61 mmHg, peak 104 mmHg), mild MVP of post leaflet, mild MR, PASP 33 mmHg.;  b. 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 Arthritis   . Atrial fibrillation ([place])    post op after CABG+AVR >> Amiodarone  . CAD (coronary artery disease)    a.  LHC (01/13/14):  dLM 70 extending into oLAD, mLAD 40-50, pD1 80, pD2 70, ostial/prox OM1 70 >> CABG (L-LAD, S-OM1, S-D1, S-D2)    . Carotid artery occlusion    a. s/p L CEA 2013;  b.  Carotid US (1/16):  Bilateral ICA 1-39%  . CLL (chronic lymphocytic leukemia) (HCC)    slow leukemia---Dr  Murinson  . H/O exercise stress test    NSSTT  . Hx of cardiovascular stress test    a. Nuclear stress test That (4/13): Normal perfusion, EF 74%  . Hx of echocardiogram    Echo (2/16):  Mild LVH, EF 60-65%, no RWMA, Gr 2 DD, AVR ok (mean 9 mmHg), trivial AI, mild MR, mild LAE, PASP 40 mmHg  . Hypertension   . Pancreatitis    h/o  . Thrombocytopenia ([place]) 11/19/2010   Past Surgical History:  Procedure Laterality Date  . ABDOMINAL HYSTERECTOMY    . AORTIC VALVE REPLACEMENT [name] 01/17/2014   Procedure: AORTIC VALVE REPLACEMENT (AVR);  Surgeon: Gaye Pollack, MD;  Location: [facility];  Service: Open Heart Surgery;  Laterality: [name];  . APPENDECTOMY    . CAROTID ENDARTERECTOMY  06/09/11   LEFT  cea  . CESAREAN SECTION     FIVE  . CHOLECYSTECTOMY    . CORONARY ARTERY BYPASS GRAFT [name] 01/17/2014   Procedure: CORONARY ARTERY BYPASS GRAFTING (CABG)TIMES FOUR USING LEFT INTERNAL MAMMARY ARTERY AND RIGHT SAPHENOUS VEIN HARVESTED ENDOSCOPICALLY;  Surgeon: Gaye Pollack, MD;  Location: [facility];  Service: Open Heart Surgery;  Laterality: [name];  . ENDARTERECTOMY  06/09/2011   Procedure: ENDARTERECTOMY CAROTID;  Surgeon: Rosetta Posner, MD;  Location[facility] OR;  Service: Vascular;  Laterality: Left;  left carotid endarterectomy with patch angioplasty  . LEFT AND RIGHT HEART CATHETERIZATION WITH CORONARY ANGIOGRAM [name] 01/13/2014   Procedure: LEFT AND RIGHT HEART CATHETERIZATION WITH CORONARY ANGIOGRAM;  Surgeon: Jettie Booze, MD;  Location: [facility] CATH LAB;  Service: Cardiovascular;  Laterality: [name];  . TEE WITHOUT CARDIOVERSION [name] 01/17/2014   Procedure: TRANSESOPHAGEAL ECHOCARDIOGRAM (TEE);  Surgeon: Gaye Pollack, MD;   Location: [facility];  Service: Open Heart Surgery;  Laterality: [name];  . TONSILLECTOMY       Current Meds  Medication Sig  . acetaminophen (TYLENOL) 325 MG tablet Take 650 mg by mouth every 6 (six) hours as needed for mild pain or headache.   Marland Kitchen amoxicillin (AMOXIL) 500 MG capsule Take 4 capsules (2,01m) 1 hour prior to all dental appointments.  .Marland Kitchenaspirin 81 MG tablet Take 81 mg by mouth daily.  .Marland Kitchenatorvastatin (LIPITOR) 10 MG tablet TAKE 1 TABLET BY MOUTH ONCE DAILY AT  6PM  . Calcium Carb-Cholecalciferol (CALCIUM 500 + D) 500-125 MG-UNIT TABS Take 1 tablet by mouth daily.  .Marland KitchenCALQUENCE 100 MG capsule TAKE 1 CAPSULE (100 MG TOTAL) BY MOUTH 2 (TWO) TIMES DAILY.  . metoprolol succinate (TOPROL-XL) 50 MG 24 hr tablet Take 50 mg by mouth daily. Take with or immediately following a meal.  . Multiple Vitamins-Minerals (  HM MULTIVITAMIN ADULT GUMMY PO) Chew two gummies daily  . oxybutynin (DITROPAN-XL) 5 MG 24 hr tablet Take 5 mg by mouth at bedtime.  . [DISCONTINUED] amLODipine (NORVASC) 5 MG tablet Take 1 tablet (5 mg total) by mouth daily.  . [DISCONTINUED] amLODipine (NORVASC) 5 MG tablet Take 5 mg by mouth daily.  . [DISCONTINUED] metoprolol succinate (TOPROL-XL) 50 MG 24 hr tablet Take 1 tablet (50 mg total) by mouth daily.     Allergies:   Patient has no known allergies.   Social History   Tobacco Use  . Smoking status: Never Smoker  . Smokeless tobacco: Never Used  Vaping Use  . Vaping Use: Never used  Substance Use Topics  . Alcohol use: No  . Drug use: No     Family Hx: The patient's family history includes Heart attack in her mother; Heart disease in her father and mother; Hypertension in her father, mother, sister, and son; Stroke in her paternal grandfather and paternal grandmother.  ROS:   Please see the history of present illness.      Labs/Other Tests and Data Reviewed:    EKG:  No ECG reviewed.  Recent Labs: 11/22/2019: ALT 14; BUN 16; Creatinine, Ser 1.08;  Hemoglobin 14.4; Platelets 136; Potassium 5.0; Sodium 138   Recent Lipid Panel Lab Results  Component Value Date/Time   CHOL 154 01/08/2018 08:21 AM   TRIG 125 01/08/2018 08:21 AM   HDL 70 01/08/2018 08:21 AM   CHOLHDL 2.2 01/08/2018 08:21 AM   CHOLHDL 2.0 05/01/2015 09:20 AM   LDLCALC 59 01/08/2018 08:21 AM    Wt Readings from Last 3 Encounters:  02/01/20 97 lb (44 kg)  11/22/19 98 lb (44.5 kg)  06/17/19 102 lb 6.4 oz (46.4 kg)     Risk Assessment/Calculations:      Objective:    Vital Signs:  BP (!) 160/86   Ht 5' (1.524 m)   Wt 97 lb (44 kg)   BMI 18.94 kg/m    VITAL SIGNS:  reviewed GEN:  no acute distress PSYCH:  normal affect  ASSESSMENT & PLAN:    1. Coronary artery disease involving native coronary artery of native heart without angina pectoris S/p CABG in 2016.  She is doing well without anginal symptoms.  Continue aspirin, atorvastatin.  Follow-up 1 year.  2. Aortic valve stenosis  3. S/P AVR Echocardiogram in December 2020 with normally functioning AVR and normal LV function.  She is due for follow-up echocardiogram in December 2022.  Continue SBE prophylaxis.  4. CLL (chronic lymphocytic leukemia) ([place]) She remains on Calquence and is followed by Dr. Alvy Bimler with oncology.  5. Essential hypertension Blood pressure is elevated.  She notes that her blood pressure does go up and down.  I have asked her to monitor her blood pressure over the next 2 weeks and send those readings in for review.  If her blood pressure remains above target (130/80), I will increase her amlodipine to 10 mg daily.  6. Mixed hyperlipidemia LDL in 2020 was optimal.  Continue current dose of atorvastatin.  I will try to arrange fasting lipids at her next blood draw with oncology.  7. Bilateral carotid artery disease, unspecified type ([place]) Followed by vascular surgery.    Time:   Today, I have spent 10 minutes with the patient with telehealth technology discussing the above  problems.     Medication Adjustments/Labs and Tests Ordered: Current medicines are reviewed at length with the patient today.  Concerns regarding medicines  are outlined above.   Tests Ordered: Orders Placed This Encounter  Procedures  . Lipid panel  . ECHOCARDIOGRAM COMPLETE    Medication Changes: No orders of the defined types were placed in this encounter.   Follow Up:  In Person in 1 year(s)  Signed, Richardson Dopp, PA-C  02/01/2020 4:32 PM    [facility]

## 2020-02-01 ENCOUNTER — Telehealth (INDEPENDENT_AMBULATORY_CARE_PROVIDER_SITE_OTHER): Payer: Medicare Other | Admitting: Physician Assistant

## 2020-02-01 ENCOUNTER — Encounter: Payer: Self-pay | Admitting: Physician Assistant

## 2020-02-01 ENCOUNTER — Other Ambulatory Visit: Payer: Self-pay

## 2020-02-01 VITALS — BP 160/86 | Ht 60.0 in | Wt 97.0 lb

## 2020-02-01 DIAGNOSIS — C9110 Chronic lymphocytic leukemia of B-cell type not having achieved remission: Secondary | ICD-10-CM | POA: Diagnosis not present

## 2020-02-01 DIAGNOSIS — I2510 Atherosclerotic heart disease of native coronary artery without angina pectoris: Secondary | ICD-10-CM | POA: Diagnosis not present

## 2020-02-01 DIAGNOSIS — I350 Nonrheumatic aortic (valve) stenosis: Secondary | ICD-10-CM

## 2020-02-01 DIAGNOSIS — Z952 Presence of prosthetic heart valve: Secondary | ICD-10-CM

## 2020-02-01 DIAGNOSIS — I10 Essential (primary) hypertension: Secondary | ICD-10-CM

## 2020-02-01 DIAGNOSIS — I779 Disorder of arteries and arterioles, unspecified: Secondary | ICD-10-CM

## 2020-02-01 DIAGNOSIS — E782 Mixed hyperlipidemia: Secondary | ICD-10-CM

## 2020-02-01 MED ORDER — AMLODIPINE BESYLATE 5 MG PO TABS: 5.0000 mg | ORAL_TABLET | Freq: Every day | ORAL | 3 refills | Status: DC

## 2020-02-01 NOTE — Patient Instructions (Signed)
Medication Instructions:  Your physician recommends that you continue on your current medications as directed. Please refer to the Current Medication list given to you today.  *If you need a refill on your cardiac medications before your next appointment, please call your pharmacy*  Lab Work: Your physician recommends that you return for lab work in: 2 months for fasting lipids  Testing/Procedures: Your physician has requested that you have an echocardiogram in December 2022. Echocardiography is a painless test that uses sound waves to create images of your heart. It provides your doctor with information about the size and shape of your heart and how well your heart's chambers and valves are working. This procedure takes approximately one hour. There are no restrictions for this procedure.  Follow-Up: At [facility], you and your health needs are our priority.  As part of our continuing mission to provide you with exceptional heart care, we have created designated Provider Care Teams.  These Care Teams include your primary Cardiologist (physician) and Advanced Practice Providers (APPs -  Physician Assistants and Nurse Practitioners) who all work together to provide you with the care you need, when you need it.  Your next appointment:   12 month(s)  The format for your next appointment:   In Person  Provider:   You may see Sherren Mocha, MD or Richardson Dopp, PA-C

## 2020-02-09 ENCOUNTER — Telehealth: Payer: Self-pay | Admitting: Physician Assistant

## 2020-02-09 MED ORDER — AMLODIPINE BESYLATE 5 MG PO TABS: 7.5000 mg | ORAL_TABLET | Freq: Every day | ORAL | 3 refills | Status: DC

## 2020-02-09 NOTE — Telephone Encounter (Signed)
Pt aware of medication dose adjustment. She had no additional questions.

## 2020-02-09 NOTE — Telephone Encounter (Signed)
Pt c/o BP issue: STAT if pt c/o blurred vision, one-sided weakness or slurred speech  1. What are your last 5 BP readings?   02/02/20: 128/56 02/03/20: 137/63 02/04/20: 157/65 02/05/20: 158/69 02/06/20: 136/70 02/07/20:   143/68 02/08/20:   147/81 02/09/20:   124/73   2. Are you having any other symptoms (ex. Dizziness, headache, blurred vision, passed out)? no  3. What is your BP issue? Patient was told at her virtual visit with Richardson Dopp 02/01/20 to record her BP readings for a week and report them to [place]  Patient states she feels great.

## 2020-02-09 NOTE — Telephone Encounter (Signed)
All diastolic readings are ideal.  She has 2 optimal systolic readings but most are above target. PLAN:  Increase Amlodipine to 7.5 mg once daily (she can break the 5 mg tab in 1/2). Keep an eye on BP and call if most readings are > 130/80. Richardson Dopp, PA-C    02/09/2020 10:02 AM

## 2020-02-20 ENCOUNTER — Other Ambulatory Visit: Payer: Self-pay | Admitting: Cardiovascular Disease

## 2020-02-21 MED FILL — CALQUENCE 100 MG CAPSULE: 100 | 30 days supply | Qty: 60 | Fill #2

## 2020-03-21 ENCOUNTER — Inpatient Hospital Stay: Payer: Medicare Other

## 2020-03-21 ENCOUNTER — Other Ambulatory Visit: Payer: Self-pay

## 2020-03-21 ENCOUNTER — Inpatient Hospital Stay: Payer: Medicare Other | Attending: Hematology and Oncology | Admitting: Hematology and Oncology

## 2020-03-21 DIAGNOSIS — C9110 Chronic lymphocytic leukemia of B-cell type not having achieved remission: Secondary | ICD-10-CM | POA: Diagnosis present

## 2020-03-21 DIAGNOSIS — I10 Essential (primary) hypertension: Secondary | ICD-10-CM | POA: Diagnosis not present

## 2020-03-21 DIAGNOSIS — Z79899 Other long term (current) drug therapy: Secondary | ICD-10-CM | POA: Diagnosis not present

## 2020-03-21 DIAGNOSIS — Z9221 Personal history of antineoplastic chemotherapy: Secondary | ICD-10-CM | POA: Diagnosis not present

## 2020-03-21 DIAGNOSIS — Z7982 Long term (current) use of aspirin: Secondary | ICD-10-CM | POA: Insufficient documentation

## 2020-03-21 DIAGNOSIS — D696 Thrombocytopenia, unspecified: Secondary | ICD-10-CM | POA: Diagnosis not present

## 2020-03-21 LAB — CBC WITH DIFFERENTIAL/PLATELET
Abs Immature Granulocytes: 0.02 10*3/uL (ref 0.00–0.07)
Basophils Absolute: 0 10*3/uL (ref 0.0–0.1)
Basophils Relative: 1 %
Eosinophils Absolute: 0 10*3/uL (ref 0.0–0.5)
Eosinophils Relative: 0 %
HCT: 40.1 % (ref 36.0–46.0)
Hemoglobin: 13.7 g/dL (ref 12.0–15.0)
Immature Granulocytes: 0 %
Lymphocytes Relative: 51 %
Lymphs Abs: 3.4 10*3/uL (ref 0.7–4.0)
MCH: 33.3 pg (ref 26.0–34.0)
MCHC: 34.2 g/dL (ref 30.0–36.0)
MCV: 97.3 fL (ref 80.0–100.0)
Monocytes Absolute: 0.8 10*3/uL (ref 0.1–1.0)
Monocytes Relative: 12 %
Neutro Abs: 2.3 10*3/uL (ref 1.7–7.7)
Neutrophils Relative %: 36 %
Platelets: 133 10*3/uL — ABNORMAL LOW (ref 150–400)
RBC: 4.12 MIL/uL (ref 3.87–5.11)
RDW: 11.6 % (ref 11.5–15.5)
WBC: 6.5 10*3/uL (ref 4.0–10.5)
nRBC: 0 % (ref 0.0–0.2)

## 2020-03-21 LAB — COMPREHENSIVE METABOLIC PANEL
ALT: 18 U/L (ref 0–44)
AST: 27 U/L (ref 15–41)
Albumin: 4.1 g/dL (ref 3.5–5.0)
Alkaline Phosphatase: 66 U/L (ref 38–126)
Anion gap: 7 (ref 5–15)
BUN: 16 mg/dL (ref 8–23)
CO2: 28 mmol/L (ref 22–32)
Calcium: 10.1 mg/dL (ref 8.9–10.3)
Chloride: 103 mmol/L (ref 98–111)
Creatinine, Ser: 1.04 mg/dL — ABNORMAL HIGH (ref 0.44–1.00)
GFR, Estimated: 55 mL/min — ABNORMAL LOW (ref >60)
Glucose, Bld: 82 mg/dL (ref 70–99)
Potassium: 4.1 mmol/L (ref 3.5–5.1)
Sodium: 138 mmol/L (ref 135–145)
Total Bilirubin: 1 mg/dL (ref 0.3–1.2)
Total Protein: 6.5 g/dL (ref 6.5–8.1)

## 2020-03-22 ENCOUNTER — Encounter: Payer: Self-pay | Admitting: Hematology and Oncology

## 2020-03-22 NOTE — Assessment & Plan Note (Signed)
She has poorly controlled hypertension noticed in the office, intermittently exacerbated by whitecoat hypertension She felt that her blood pressure at home is better She is not symptomatic We discussed the importance of close blood Pressure monitoring and follow-up with her cardiologist for medication adjustment if needed

## 2020-03-22 NOTE — Assessment & Plan Note (Signed)
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 

## 2020-03-22 NOTE — Progress Notes (Signed)
[place] OFFICE PROGRESS NOTE  Patient Care Team: Kristie Cowman, MD as PCP - General (Family Medicine) Sherren Mocha, MD as PCP - Cardiology (Cardiology) Kristie Cowman, MD (Family Medicine) Terance Ice, MD (Inactive) (Cardiology)  ASSESSMENT & PLAN:  CLL (chronic lymphocytic leukemia) So far, she tolerated treatment well with expected side effects such as persistent thrombocytopenia and bruising She will continue the same treatment without dose adjustment Due to stability of her blood work, I will see her again in 4 month for further follow-up  Essential hypertension She has poorly controlled hypertension noticed in the office, intermittently exacerbated by whitecoat hypertension She felt that her blood pressure at home is better She is not symptomatic We discussed the importance of close blood Pressure monitoring and follow-up with her cardiologist for medication adjustment if needed  Thrombocytopenia She has stable thrombocytopenia, 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     No orders of the defined types were placed in this encounter.   All questions were answered. The patient knows to call the clinic with any problems, questions or concerns. The total time spent in the appointment was 20 minutes encounter with patients including review of chart and various tests results, discussions about plan of care and coordination of care plan   Heath Lark, MD 03/22/2020 8:15 AM  INTERVAL HISTORY: Please see below for problem oriented charting. She returns for further follow-up She complained of skin bruising Denies irregular heartbeat No recent diarrhea Denies recent infection  SUMMARY OF ONCOLOGIC HISTORY: Oncology History  CLL (chronic lymphocytic leukemia) ([place])  03/21/2011 Initial Diagnosis   CLL (chronic  lymphocytic leukemia)   01/14/2014 - 01/31/2014 Hospital Admission   The patient was hospitalized and was found to severe aortic stenosis causing syncopal episode. She received treatment with steroids with minimum success and required platelet transfusion.   09/18/2014 Imaging   CT scan of the chest, abdomen and pelvis show lymphadenopathy but low level burden of disease.   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 There was no stenosis. Mild peri-valvular aortic insufficiency is noted   11/04/2014 -  Chemotherapy   She is started on Ibrutinib   06/17/2016 Imaging   1. No lymphadenopathy noted in the chest, abdomen or pelvis to suggest recurrent disease. 2. Aortic atherosclerosis, in addition to left main and 3 vessel coronary artery disease. Status post median sternotomy for CABG including LIMA to the LAD. 3. 3 mm nonobstructive calculus in the lower pole collecting system of the left kidney. Mild atrophy of the left kidney. 4. Mild colonic diverticulosis, without evidence of acute diverticulitis at this time. 5. Additional incidental findings, as above.   12/06/2018 Imaging   1. Increase in size and number of abdominal retroperitoneal and small bowel mesenteric lymph nodes, consistent with recurrent CLL. 2.  Aortic atherosclerosis (ICD10-170.0).   12/07/2018 Cancer Staging   Staging form: Chronic Lymphocytic Leukemia / Small Lymphocytic Lymphoma, AJCC 8th Edition - Clinical stage from 12/07/2018: Modified Rai Stage IV (Modified Rai risk: High, Binet: Stage B, Lugano: Stage II, Lymphocytosis: Absent, Adenopathy: Present, Organomegaly: Absent, Anemia: Absent, Thrombocytopenia: Present) - Signed by Heath Lark, MD on 12/07/2018   01/07/2019 -  Chemotherapy   The patient had acalabrutinib for chemotherapy treatment.       REVIEW  OF SYSTEMS:   Constitutional: Denies fevers,  chills or abnormal weight loss Eyes: Denies blurriness of vision Ears, nose, mouth, throat, and face: Denies mucositis or sore throat Respiratory: Denies cough, dyspnea or wheezes Cardiovascular: Denies palpitation, chest discomfort or lower extremity swelling Gastrointestinal:  Denies nausea, heartburn or change in bowel habits Skin: Denies abnormal skin rashes Lymphatics: Denies new lymphadenopathy  Neurological:Denies numbness, tingling or new weaknesses Behavioral/Psych: Mood is stable, no new changes  All other systems were reviewed with the patient and are negative.  I have reviewed the past medical history, past surgical history, social history and family history with the patient and they are unchanged from previous note.  ALLERGIES:  has No Known Allergies.  MEDICATIONS:  Current Outpatient Medications  Medication Sig Dispense Refill  . acetaminophen (TYLENOL) 325 MG tablet Take 650 mg by mouth every 6 (six) hours as needed for mild pain or headache.     Marland Kitchen amLODipine (NORVASC) 5 MG tablet Take 1.5 tablets (7.5 mg total) by mouth daily. 135 tablet 3  . amoxicillin (AMOXIL) 500 MG capsule Take 4 capsules (2,000mg ) 1 hour prior to all dental appointments. 8 capsule 11  . aspirin 81 MG tablet Take 81 mg by mouth daily.    Marland Kitchen atorvastatin (LIPITOR) 10 MG tablet TAKE 1 TABLET BY MOUTH ONCE DAILY AT  6PM 90 tablet 0  . Calcium Carb-Cholecalciferol (CALCIUM 500 + D) 500-125 MG-UNIT TABS Take 1 tablet by mouth daily.    Marland Kitchen CALQUENCE 100 MG capsule TAKE 1 CAPSULE (100 MG TOTAL) BY MOUTH 2 (TWO) TIMES DAILY. 60 capsule 11  . metoprolol succinate (TOPROL-XL) 50 MG 24 hr tablet Take 1 tablet by mouth once daily 90 tablet 0  . Multiple Vitamins-Minerals (HM MULTIVITAMIN ADULT GUMMY PO) Chew two gummies daily    . oxybutynin (DITROPAN-XL) 5 MG 24 hr tablet Take 5 mg by mouth at bedtime.     No current facility-administered medications for this visit.    PHYSICAL EXAMINATION: ECOG PERFORMANCE  STATUS: 1 - Symptomatic but completely ambulatory  Vitals:   03/21/20 0933 03/21/20 0936  BP: (!) 193/59 (!) 177/49  Pulse: (!) 57   Resp: 17   Temp: (!) 95.2 F (35.1 C)   SpO2: 100%    Filed Weights   03/21/20 0933  Weight: 99 lb 6.4 oz (45.1 kg)    GENERAL:alert, no distress and comfortable SKIN: skin color, texture, turgor are normal, no rashes or significant lesions.  Noted skin bruises EYES: normal, Conjunctiva are pink and non-injected, sclera clear OROPHARYNX:no exudate, no erythema and lips, buccal mucosa, and tongue normal  NECK: supple, thyroid normal size, non-tender, without nodularity LYMPH:  no palpable lymphadenopathy in the cervical, axillary or inguinal LUNGS: clear to auscultation and percussion with normal breathing effort HEART: regular rate & rhythm and no murmurs and no lower extremity edema ABDOMEN:abdomen soft, non-tender and normal bowel sounds Musculoskeletal:no cyanosis of digits and no clubbing  NEURO: alert & oriented x 3 with fluent speech, no focal motor/sensory deficits  LABORATORY DATA:  I have reviewed the data as listed    Component Value Date/Time   NA 138 03/21/2020 0918   NA 137 04/05/2019 0829   NA 137 12/18/2016 0916   K 4.1 03/21/2020 0918   K 4.4 12/18/2016 0916   CL 103 03/21/2020 0918   CL 105 05/20/2012 0823   CO2 28 03/21/2020 0918   CO2 25 12/18/2016 0916   GLUCOSE 82 03/21/2020 0918   GLUCOSE 88 12/18/2016 0916  GLUCOSE 70 05/20/2012 0823   BUN 16 03/21/2020 0918   BUN 21 04/05/2019 0829   BUN 14.0 12/18/2016 0916   CREATININE 1.04 (H) 03/21/2020 0918   CREATININE 1.0 12/18/2016 0916   CALCIUM 10.1 03/21/2020 0918   CALCIUM 9.9 12/18/2016 0916   PROT 6.5 03/21/2020 0918   PROT 6.1 (L) 12/18/2016 0916   ALBUMIN 4.1 03/21/2020 0918   ALBUMIN 3.9 12/18/2016 0916   AST 27 03/21/2020 0918   AST 28 12/18/2016 0916   ALT 18 03/21/2020 0918   ALT 16 12/18/2016 0916   ALKPHOS 66 03/21/2020 0918   ALKPHOS 54  12/18/2016 0916   BILITOT 1.0 03/21/2020 0918   BILITOT 1.03 12/18/2016 0916   GFRNONAA 55 (L) 03/21/2020 0918   GFRAA 40 (L) 06/17/2019 0845    No results found for: SPEP, UPEP  Lab Results  Component Value Date   WBC 6.5 03/21/2020   NEUTROABS 2.3 03/21/2020   HGB 13.7 03/21/2020   HCT 40.1 03/21/2020   MCV 97.3 03/21/2020   PLT 133 (L) 03/21/2020      Chemistry      Component Value Date/Time   NA 138 03/21/2020 0918   NA 137 04/05/2019 0829   NA 137 12/18/2016 0916   K 4.1 03/21/2020 0918   K 4.4 12/18/2016 0916   CL 103 03/21/2020 0918   CL 105 05/20/2012 0823   CO2 28 03/21/2020 0918   CO2 25 12/18/2016 0916   BUN 16 03/21/2020 0918   BUN 21 04/05/2019 0829   BUN 14.0 12/18/2016 0916   CREATININE 1.04 (H) 03/21/2020 0918   CREATININE 1.0 12/18/2016 0916      Component Value Date/Time   CALCIUM 10.1 03/21/2020 0918   CALCIUM 9.9 12/18/2016 0916   ALKPHOS 66 03/21/2020 0918   ALKPHOS 54 12/18/2016 0916   AST 27 03/21/2020 0918   AST 28 12/18/2016 0916   ALT 18 03/21/2020 0918   ALT 16 12/18/2016 0916   BILITOT 1.0 03/21/2020 0918   BILITOT 1.03 12/18/2016 0916

## 2020-03-22 NOTE — Assessment & Plan Note (Signed)
So far, she tolerated treatment well with expected side effects such as persistent thrombocytopenia and bruising She will continue the same treatment without dose adjustment Due to stability of her blood work, I will see her again in 4 month for further follow-up

## 2020-03-30 ENCOUNTER — Other Ambulatory Visit: Payer: Self-pay | Admitting: Cardiovascular Disease

## 2020-04-05 ENCOUNTER — Other Ambulatory Visit (HOSPITAL_COMMUNITY): Payer: Self-pay

## 2020-04-17 ENCOUNTER — Other Ambulatory Visit (HOSPITAL_COMMUNITY): Payer: Self-pay

## 2020-04-17 MED FILL — Acalabrutinib Cap 100 MG: ORAL | 30 days supply | Qty: 60 | Fill #0 | Status: AC

## 2020-04-19 ENCOUNTER — Other Ambulatory Visit (HOSPITAL_COMMUNITY): Payer: Self-pay

## 2020-05-15 ENCOUNTER — Other Ambulatory Visit (HOSPITAL_COMMUNITY): Payer: Self-pay

## 2020-05-15 MED FILL — Acalabrutinib Cap 100 MG: ORAL | 30 days supply | Qty: 60 | Fill #1 | Status: AC

## 2020-05-16 ENCOUNTER — Other Ambulatory Visit: Payer: Self-pay | Admitting: Cardiovascular Disease

## 2020-05-18 ENCOUNTER — Other Ambulatory Visit (HOSPITAL_COMMUNITY): Payer: Self-pay

## 2020-06-12 ENCOUNTER — Other Ambulatory Visit (HOSPITAL_COMMUNITY): Payer: Self-pay

## 2020-06-12 MED FILL — Acalabrutinib Cap 100 MG: ORAL | 30 days supply | Qty: 60 | Fill #2 | Status: AC

## 2020-06-15 ENCOUNTER — Other Ambulatory Visit (HOSPITAL_COMMUNITY): Payer: Self-pay

## 2020-07-10 ENCOUNTER — Other Ambulatory Visit (HOSPITAL_COMMUNITY): Payer: Self-pay

## 2020-07-10 MED FILL — Acalabrutinib Cap 100 MG: ORAL | 30 days supply | Qty: 60 | Fill #3 | Status: CN

## 2020-07-11 ENCOUNTER — Telehealth: Payer: Self-pay

## 2020-07-11 ENCOUNTER — Other Ambulatory Visit (HOSPITAL_COMMUNITY): Payer: Self-pay

## 2020-07-11 MED FILL — Acalabrutinib Cap 100 MG: ORAL | 30 days supply | Qty: 60 | Fill #3 | Status: AC

## 2020-07-11 NOTE — Telephone Encounter (Signed)
Oral Oncology Patient Advocate Encounter   Was successful in securing patient an $87 grant from Patient [facility]) to provide copayment coverage for Calquence.  This will keep the out of pocket expense at $0.     I have spoken with the patient.    The billing information is as follows and has been shared with [place].   Member ID: [id number] Group ID: [id number] RxBin: [id number] Dates of Eligibility: 07/11/20 through 07/10/21  Fund:  [place] Patient [facility] Phone [phone] Fax [phone] 07/11/2020 12:39 PM

## 2020-07-11 NOTE — Telephone Encounter (Signed)
Oral Oncology Patient Advocate Encounter  Met patient in [facility] to complete [place] in an effort to reduce the patient's out of pocket expense for Calquence to $0.    Application completed and faxed to [phone].   [place] phone number for follow up is [phone].   This encounter will be updated until final determination.    [facility] Patient Advocate [facility] Phone [phone] Fax [phone] 07/11/2020 10:37 AM  

## 2020-07-12 ENCOUNTER — Other Ambulatory Visit (HOSPITAL_COMMUNITY): Payer: Self-pay

## 2020-07-13 NOTE — Telephone Encounter (Signed)
Patient is approved for Calquence from [place] 07/11/20-01/05/21  [employer] uses [employer] by [employer]  Patient is currently using a grant (obtained while manufacturer app was processing) at [place] Patient [place] Phone [phone] Fax [phone] 07/13/2020 8:38 AM

## 2020-07-23 ENCOUNTER — Inpatient Hospital Stay: Payer: Medicare Other

## 2020-07-23 ENCOUNTER — Other Ambulatory Visit: Payer: Self-pay

## 2020-07-23 ENCOUNTER — Encounter: Payer: Self-pay | Admitting: Hematology and Oncology

## 2020-07-23 ENCOUNTER — Inpatient Hospital Stay: Payer: Medicare Other | Attending: Hematology and Oncology | Admitting: Hematology and Oncology

## 2020-07-23 DIAGNOSIS — Z7982 Long term (current) use of aspirin: Secondary | ICD-10-CM | POA: Diagnosis not present

## 2020-07-23 DIAGNOSIS — R233 Spontaneous ecchymoses: Secondary | ICD-10-CM

## 2020-07-23 DIAGNOSIS — I10 Essential (primary) hypertension: Secondary | ICD-10-CM | POA: Insufficient documentation

## 2020-07-23 DIAGNOSIS — R238 Other skin changes: Secondary | ICD-10-CM | POA: Diagnosis not present

## 2020-07-23 DIAGNOSIS — C9110 Chronic lymphocytic leukemia of B-cell type not having achieved remission: Secondary | ICD-10-CM | POA: Insufficient documentation

## 2020-07-23 DIAGNOSIS — D696 Thrombocytopenia, unspecified: Secondary | ICD-10-CM | POA: Diagnosis not present

## 2020-07-23 DIAGNOSIS — Z79899 Other long term (current) drug therapy: Secondary | ICD-10-CM | POA: Insufficient documentation

## 2020-07-23 LAB — CBC WITH DIFFERENTIAL/PLATELET
Abs Immature Granulocytes: 0.01 10*3/uL (ref 0.00–0.07)
Basophils Absolute: 0 10*3/uL (ref 0.0–0.1)
Basophils Relative: 1 %
Eosinophils Absolute: 0 10*3/uL (ref 0.0–0.5)
Eosinophils Relative: 0 %
HCT: 38.8 % (ref 36.0–46.0)
Hemoglobin: 13.4 g/dL (ref 12.0–15.0)
Immature Granulocytes: 0 %
Lymphocytes Relative: 50 %
Lymphs Abs: 3.3 10*3/uL (ref 0.7–4.0)
MCH: 33.2 pg (ref 26.0–34.0)
MCHC: 34.5 g/dL (ref 30.0–36.0)
MCV: 96 fL (ref 80.0–100.0)
Monocytes Absolute: 0.8 10*3/uL (ref 0.1–1.0)
Monocytes Relative: 12 %
Neutro Abs: 2.5 10*3/uL (ref 1.7–7.7)
Neutrophils Relative %: 37 %
Platelets: 133 10*3/uL — ABNORMAL LOW (ref 150–400)
RBC: 4.04 MIL/uL (ref 3.87–5.11)
RDW: 11.6 % (ref 11.5–15.5)
WBC: 6.6 10*3/uL (ref 4.0–10.5)
nRBC: 0 % (ref 0.0–0.2)

## 2020-07-23 LAB — COMPREHENSIVE METABOLIC PANEL
ALT: 18 U/L (ref 0–44)
AST: 28 U/L (ref 15–41)
Albumin: 3.9 g/dL (ref 3.5–5.0)
Alkaline Phosphatase: 51 U/L (ref 38–126)
Anion gap: 8 (ref 5–15)
BUN: 18 mg/dL (ref 8–23)
CO2: 29 mmol/L (ref 22–32)
Calcium: 9.9 mg/dL (ref 8.9–10.3)
Chloride: 102 mmol/L (ref 98–111)
Creatinine, Ser: 1.33 mg/dL — ABNORMAL HIGH (ref 0.44–1.00)
GFR, Estimated: 41 mL/min — ABNORMAL LOW (ref >60)
Glucose, Bld: 73 mg/dL (ref 70–99)
Potassium: 4.4 mmol/L (ref 3.5–5.1)
Sodium: 139 mmol/L (ref 135–145)
Total Bilirubin: 1 mg/dL (ref 0.3–1.2)
Total Protein: 6.1 g/dL — ABNORMAL LOW (ref 6.5–8.1)

## 2020-07-23 NOTE — Assessment & Plan Note (Signed)
So far, she tolerated treatment well with expected side effects such as persistent thrombocytopenia and bruising She will continue the same treatment without dose adjustment Due to stability of her blood work, I will see her again in 4 month for further follow-up

## 2020-07-23 NOTE — Progress Notes (Signed)
[place] OFFICE PROGRESS NOTE  Patient Care Team: Kristie Cowman, MD as PCP - General (Family Medicine) Sherren Mocha, MD as PCP - Cardiology (Cardiology) Kristie Cowman, MD (Family Medicine) Terance Ice, MD (Inactive) (Cardiology)  ASSESSMENT & PLAN:  CLL (chronic lymphocytic leukemia) ([place]) So far, she tolerated treatment well with expected side effects such as persistent thrombocytopenia and bruising She will continue the same treatment without dose adjustment Due to stability of her blood work, I will see her again in 4 month for further follow-up  Essential hypertension She has poorly controlled hypertension noticed in the office, intermittently exacerbated by whitecoat hypertension She felt that her blood pressure at home is better She is not symptomatic We discussed the importance of close blood Pressure monitoring and follow-up with her cardiologist for medication adjustment if needed  Easy bruising She has chronic easy bruising due to aspirin therapy and chemotherapy I recommend close monitoring for now  Thrombocytopenia [facility]) She has stable thrombocytopenia, 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  No orders of the defined types were placed in this encounter.   All questions were answered. The patient knows to call the clinic with any problems, questions or concerns. The total time spent in the appointment was 20 minutes encounter with patients including review of chart and various tests results, discussions about plan of care and coordination of care plan   Heath Lark, MD 07/23/2020 10:37 AM  INTERVAL HISTORY: Please see below for problem oriented charting. She returns for further follow-up She is compliant taking Calquence as directed Apart from bruising, she have no new side effects According to the  patient, her documented blood pressure from home is better She denies recent infection  SUMMARY OF ONCOLOGIC HISTORY: Oncology History  CLL (chronic lymphocytic leukemia) ([place])  03/21/2011 Initial Diagnosis   CLL (chronic lymphocytic leukemia)    01/14/2014 - 01/31/2014 Hospital Admission   The patient was hospitalized and was found to severe aortic stenosis causing syncopal episode. She received treatment with steroids with minimum success and required platelet transfusion.    09/18/2014 Imaging   CT scan of the chest, abdomen and pelvis show lymphadenopathy but low level burden of disease.    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 There was no stenosis. Mild peri-valvular aortic insufficiency is noted    11/04/2014 -  Chemotherapy   She is started on Ibrutinib    06/17/2016 Imaging   1. No lymphadenopathy noted in the chest, abdomen or pelvis to suggest recurrent disease. 2. Aortic atherosclerosis, in addition to left main and 3 vessel coronary artery disease. Status post median sternotomy for CABG including LIMA to the LAD. 3. 3 mm nonobstructive calculus in the lower pole collecting system of the left kidney. Mild atrophy of the left kidney. 4. Mild colonic diverticulosis, without evidence of acute diverticulitis at this time. 5. Additional incidental findings, as above.    12/06/2018 Imaging   1. Increase in size and number of abdominal retroperitoneal and small bowel mesenteric lymph nodes, consistent with recurrent CLL. 2.  Aortic atherosclerosis (ICD10-170.0).   12/07/2018 Cancer Staging   Staging form: Chronic Lymphocytic Leukemia / Small Lymphocytic Lymphoma, AJCC 8th Edition - Clinical stage from 12/07/2018: Modified Rai Stage IV (Modified Rai risk: High, Binet: Stage  B, Lugano: Stage II, Lymphocytosis: Absent, Adenopathy: Present, Organomegaly: Absent,  Anemia: Absent, Thrombocytopenia: Present) - Signed by Heath Lark, MD on 12/07/2018    01/07/2019 -  Chemotherapy   The patient had acalabrutinib for chemotherapy treatment.       REVIEW OF SYSTEMS:   Constitutional: Denies fevers, chills or abnormal weight loss Eyes: Denies blurriness of vision Ears, nose, mouth, throat, and face: Denies mucositis or sore throat Respiratory: Denies cough, dyspnea or wheezes Cardiovascular: Denies palpitation, chest discomfort or lower extremity swelling Gastrointestinal:  Denies nausea, heartburn or change in bowel habits Skin: Denies abnormal skin rashes Lymphatics: Denies new lymphadenopathy Neurological:Denies numbness, tingling or new weaknesses Behavioral/Psych: Mood is stable, no new changes  All other systems were reviewed with the patient and are negative.  I have reviewed the past medical history, past surgical history, social history and family history with the patient and they are unchanged from previous note.  ALLERGIES:  has No Known Allergies.  MEDICATIONS:  Current Outpatient Medications  Medication Sig Dispense Refill   acalabrutinib (CALQUENCE) 100 MG capsule TAKE 1 CAPSULE (100 MG TOTAL) BY MOUTH 2 (TWO) TIMES DAILY. 60 capsule 11   acetaminophen (TYLENOL) 325 MG tablet Take 650 mg by mouth every 6 (six) hours as needed for mild pain or headache.      amLODipine (NORVASC) 5 MG tablet Take 1.5 tablets (7.5 mg total) by mouth daily. 135 tablet 3   amoxicillin (AMOXIL) 500 MG capsule Take 4 capsules (2,000mg ) 1 hour prior to all dental appointments. 8 capsule 11   aspirin 81 MG tablet Take 81 mg by mouth daily.     atorvastatin (LIPITOR) 10 MG tablet TAKE 1 TABLET BY MOUTH ONCE DAILY 6 IN THE EVENING 90 tablet 2   Calcium Carb-Cholecalciferol (CALCIUM 500 + D) 500-125 MG-UNIT TABS Take 1 tablet by mouth daily.     metoprolol succinate (TOPROL-XL) 50 MG 24 hr tablet Take 1 tablet by mouth once daily 90 tablet 0   Multiple  Vitamins-Minerals (HM MULTIVITAMIN ADULT GUMMY PO) Chew two gummies daily     oxybutynin (DITROPAN-XL) 5 MG 24 hr tablet Take 5 mg by mouth at bedtime.     No current facility-administered medications for this visit.    PHYSICAL EXAMINATION: ECOG PERFORMANCE STATUS: 1 - Symptomatic but completely ambulatory  Vitals:   07/23/20 1002  BP: (!) 162/57  Pulse: 66  Resp: 18  Temp: 98 F (36.7 C)  SpO2: 100%   Filed Weights   07/23/20 1002  Weight: 96 lb 6.4 oz (43.7 kg)    GENERAL:alert, no distress and comfortable SKIN: Noted skin bruises  NEURO: alert & oriented x 3 with fluent speech, no focal motor/sensory deficits  LABORATORY DATA:  I have reviewed the data as listed    Component Value Date/Time   NA 139 07/23/2020 0924   NA 137 04/05/2019 0829   NA 137 12/18/2016 0916   K 4.4 07/23/2020 0924   K 4.4 12/18/2016 0916   CL 102 07/23/2020 0924   CL 105 05/20/2012 0823   CO2 29 07/23/2020 0924   CO2 25 12/18/2016 0916   GLUCOSE 73 07/23/2020 0924   GLUCOSE 88 12/18/2016 0916   GLUCOSE 70 05/20/2012 0823   BUN 18 07/23/2020 0924   BUN 21 04/05/2019 0829   BUN 14.0 12/18/2016 0916   CREATININE 1.33 (H) 07/23/2020 0924   CREATININE 1.0 12/18/2016 0916   CALCIUM 9.9 07/23/2020 0924   CALCIUM 9.9 12/18/2016 0916   PROT 6.1 (L)  07/23/2020 0924   PROT 6.1 (L) 12/18/2016 0916   ALBUMIN 3.9 07/23/2020 0924   ALBUMIN 3.9 12/18/2016 0916   AST 28 07/23/2020 0924   AST 28 12/18/2016 0916   ALT 18 07/23/2020 0924   ALT 16 12/18/2016 0916   ALKPHOS 51 07/23/2020 0924   ALKPHOS 54 12/18/2016 0916   BILITOT 1.0 07/23/2020 0924   BILITOT 1.03 12/18/2016 0916   GFRNONAA 41 (L) 07/23/2020 0924   GFRAA 40 (L) 06/17/2019 0845    No results found for: SPEP, UPEP  Lab Results  Component Value Date   WBC 6.6 07/23/2020   NEUTROABS 2.5 07/23/2020   HGB 13.4 07/23/2020   HCT 38.8 07/23/2020   MCV 96.0 07/23/2020   PLT 133 (L) 07/23/2020      Chemistry      Component  Value Date/Time   NA 139 07/23/2020 0924   NA 137 04/05/2019 0829   NA 137 12/18/2016 0916   K 4.4 07/23/2020 0924   K 4.4 12/18/2016 0916   CL 102 07/23/2020 0924   CL 105 05/20/2012 0823   CO2 29 07/23/2020 0924   CO2 25 12/18/2016 0916   BUN 18 07/23/2020 0924   BUN 21 04/05/2019 0829   BUN 14.0 12/18/2016 0916   CREATININE 1.33 (H) 07/23/2020 0924   CREATININE 1.0 12/18/2016 0916      Component Value Date/Time   CALCIUM 9.9 07/23/2020 0924   CALCIUM 9.9 12/18/2016 0916   ALKPHOS 51 07/23/2020 0924   ALKPHOS 54 12/18/2016 0916   AST 28 07/23/2020 0924   AST 28 12/18/2016 0916   ALT 18 07/23/2020 0924   ALT 16 12/18/2016 0916   BILITOT 1.0 07/23/2020 0924   BILITOT 1.03 12/18/2016 0916

## 2020-07-23 NOTE — Assessment & Plan Note (Signed)
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

## 2020-07-23 NOTE — Assessment & Plan Note (Signed)
She has poorly controlled hypertension noticed in the office, intermittently exacerbated by whitecoat hypertension She felt that her blood pressure at home is better She is not symptomatic We discussed the importance of close blood Pressure monitoring and follow-up with her cardiologist for medication adjustment if needed

## 2020-07-23 NOTE — Assessment & Plan Note (Signed)
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

## 2020-07-24 ENCOUNTER — Telehealth: Payer: Self-pay | Admitting: Hematology and Oncology

## 2020-07-24 NOTE — Telephone Encounter (Signed)
Scheduled per 7/18 los. Called and spoke with pt confirmed 11/18 appt

## 2020-07-28 ENCOUNTER — Other Ambulatory Visit: Payer: Self-pay | Admitting: Cardiovascular Disease

## 2020-07-28 DIAGNOSIS — Z952 Presence of prosthetic heart valve: Secondary | ICD-10-CM

## 2020-08-07 ENCOUNTER — Other Ambulatory Visit (HOSPITAL_COMMUNITY): Payer: Self-pay

## 2020-08-07 MED FILL — Acalabrutinib Cap 100 MG: ORAL | 30 days supply | Qty: 60 | Fill #4 | Status: AC

## 2020-08-09 ENCOUNTER — Other Ambulatory Visit (HOSPITAL_COMMUNITY): Payer: Self-pay

## 2020-08-13 ENCOUNTER — Other Ambulatory Visit: Payer: Self-pay | Admitting: Physician Assistant

## 2020-08-18 ENCOUNTER — Emergency Department (HOSPITAL_BASED_OUTPATIENT_CLINIC_OR_DEPARTMENT_OTHER): Payer: Medicare Other

## 2020-08-18 ENCOUNTER — Emergency Department (HOSPITAL_BASED_OUTPATIENT_CLINIC_OR_DEPARTMENT_OTHER)
Admission: EM | Admit: 2020-08-18 | Discharge: 2020-08-18 | Disposition: A | Discharge status: Home / Self Care | Payer: Medicare Other | Attending: Emergency Medicine | Admitting: Emergency Medicine

## 2020-08-18 ENCOUNTER — Other Ambulatory Visit: Payer: Self-pay

## 2020-08-18 DIAGNOSIS — C9110 Chronic lymphocytic leukemia of B-cell type not having achieved remission: Secondary | ICD-10-CM | POA: Insufficient documentation

## 2020-08-18 DIAGNOSIS — W19XXXA Unspecified fall, initial encounter: Secondary | ICD-10-CM | POA: Insufficient documentation

## 2020-08-18 DIAGNOSIS — R55 Syncope and collapse: Secondary | ICD-10-CM | POA: Insufficient documentation

## 2020-08-18 DIAGNOSIS — S0990XA Unspecified injury of head, initial encounter: Secondary | ICD-10-CM | POA: Insufficient documentation

## 2020-08-18 DIAGNOSIS — S01511A Laceration without foreign body of lip, initial encounter: Secondary | ICD-10-CM | POA: Diagnosis not present

## 2020-08-18 DIAGNOSIS — Z7982 Long term (current) use of aspirin: Secondary | ICD-10-CM | POA: Diagnosis not present

## 2020-08-18 DIAGNOSIS — I2510 Atherosclerotic heart disease of native coronary artery without angina pectoris: Secondary | ICD-10-CM | POA: Insufficient documentation

## 2020-08-18 DIAGNOSIS — Y939 Activity, unspecified: Secondary | ICD-10-CM | POA: Insufficient documentation

## 2020-08-18 DIAGNOSIS — I129 Hypertensive chronic kidney disease with stage 1 through stage 4 chronic kidney disease, or unspecified chronic kidney disease: Secondary | ICD-10-CM | POA: Diagnosis not present

## 2020-08-18 DIAGNOSIS — N189 Chronic kidney disease, unspecified: Secondary | ICD-10-CM | POA: Insufficient documentation

## 2020-08-18 DIAGNOSIS — Y929 Unspecified place or not applicable: Secondary | ICD-10-CM | POA: Insufficient documentation

## 2020-08-18 LAB — CBC WITH DIFFERENTIAL/PLATELET
Abs Immature Granulocytes: 0.02 10*3/uL (ref 0.00–0.07)
Basophils Absolute: 0 10*3/uL (ref 0.0–0.1)
Basophils Relative: 0 %
Eosinophils Absolute: 0 10*3/uL (ref 0.0–0.5)
Eosinophils Relative: 0 %
HCT: 40.2 % (ref 36.0–46.0)
Hemoglobin: 14.2 g/dL (ref 12.0–15.0)
Immature Granulocytes: 0 %
Lymphocytes Relative: 39 %
Lymphs Abs: 3 10*3/uL (ref 0.7–4.0)
MCH: 33.3 pg (ref 26.0–34.0)
MCHC: 35.3 g/dL (ref 30.0–36.0)
MCV: 94.1 fL (ref 80.0–100.0)
Monocytes Absolute: 0.7 10*3/uL (ref 0.1–1.0)
Monocytes Relative: 9 %
Neutro Abs: 3.8 10*3/uL (ref 1.7–7.7)
Neutrophils Relative %: 52 %
Platelets: 127 10*3/uL — ABNORMAL LOW (ref 150–400)
RBC: 4.27 MIL/uL (ref 3.87–5.11)
RDW: 11.7 % (ref 11.5–15.5)
WBC: 7.5 10*3/uL (ref 4.0–10.5)
nRBC: 0 % (ref 0.0–0.2)

## 2020-08-18 LAB — COMPREHENSIVE METABOLIC PANEL
ALT: 18 U/L (ref 0–44)
AST: 31 U/L (ref 15–41)
Albumin: 4.6 g/dL (ref 3.5–5.0)
Alkaline Phosphatase: 54 U/L (ref 38–126)
Anion gap: 13 (ref 5–15)
BUN: 21 mg/dL (ref 8–23)
CO2: 24 mmol/L (ref 22–32)
Calcium: 10.6 mg/dL — ABNORMAL HIGH (ref 8.9–10.3)
Chloride: 97 mmol/L — ABNORMAL LOW (ref 98–111)
Creatinine, Ser: 1.17 mg/dL — ABNORMAL HIGH (ref 0.44–1.00)
GFR, Estimated: 47 mL/min — ABNORMAL LOW (ref >60)
Glucose, Bld: 104 mg/dL — ABNORMAL HIGH (ref 70–99)
Potassium: 3.6 mmol/L (ref 3.5–5.1)
Sodium: 134 mmol/L — ABNORMAL LOW (ref 135–145)
Total Bilirubin: 0.8 mg/dL (ref 0.3–1.2)
Total Protein: 6.7 g/dL (ref 6.5–8.1)

## 2020-08-18 LAB — TROPONIN I (HIGH SENSITIVITY): Troponin I (High Sensitivity): 10 ng/L (ref <18)

## 2020-08-18 IMAGING — CR DG SHOULDER 2+V*R*
3 series · 3 of 3 positions shown · non-contrast
Comparison: None.

CLINICAL DATA: Fall, right shoulder pain

EXAM:
RIGHT SHOULDER - 2+ VIEW

[w shoulder grashey right]
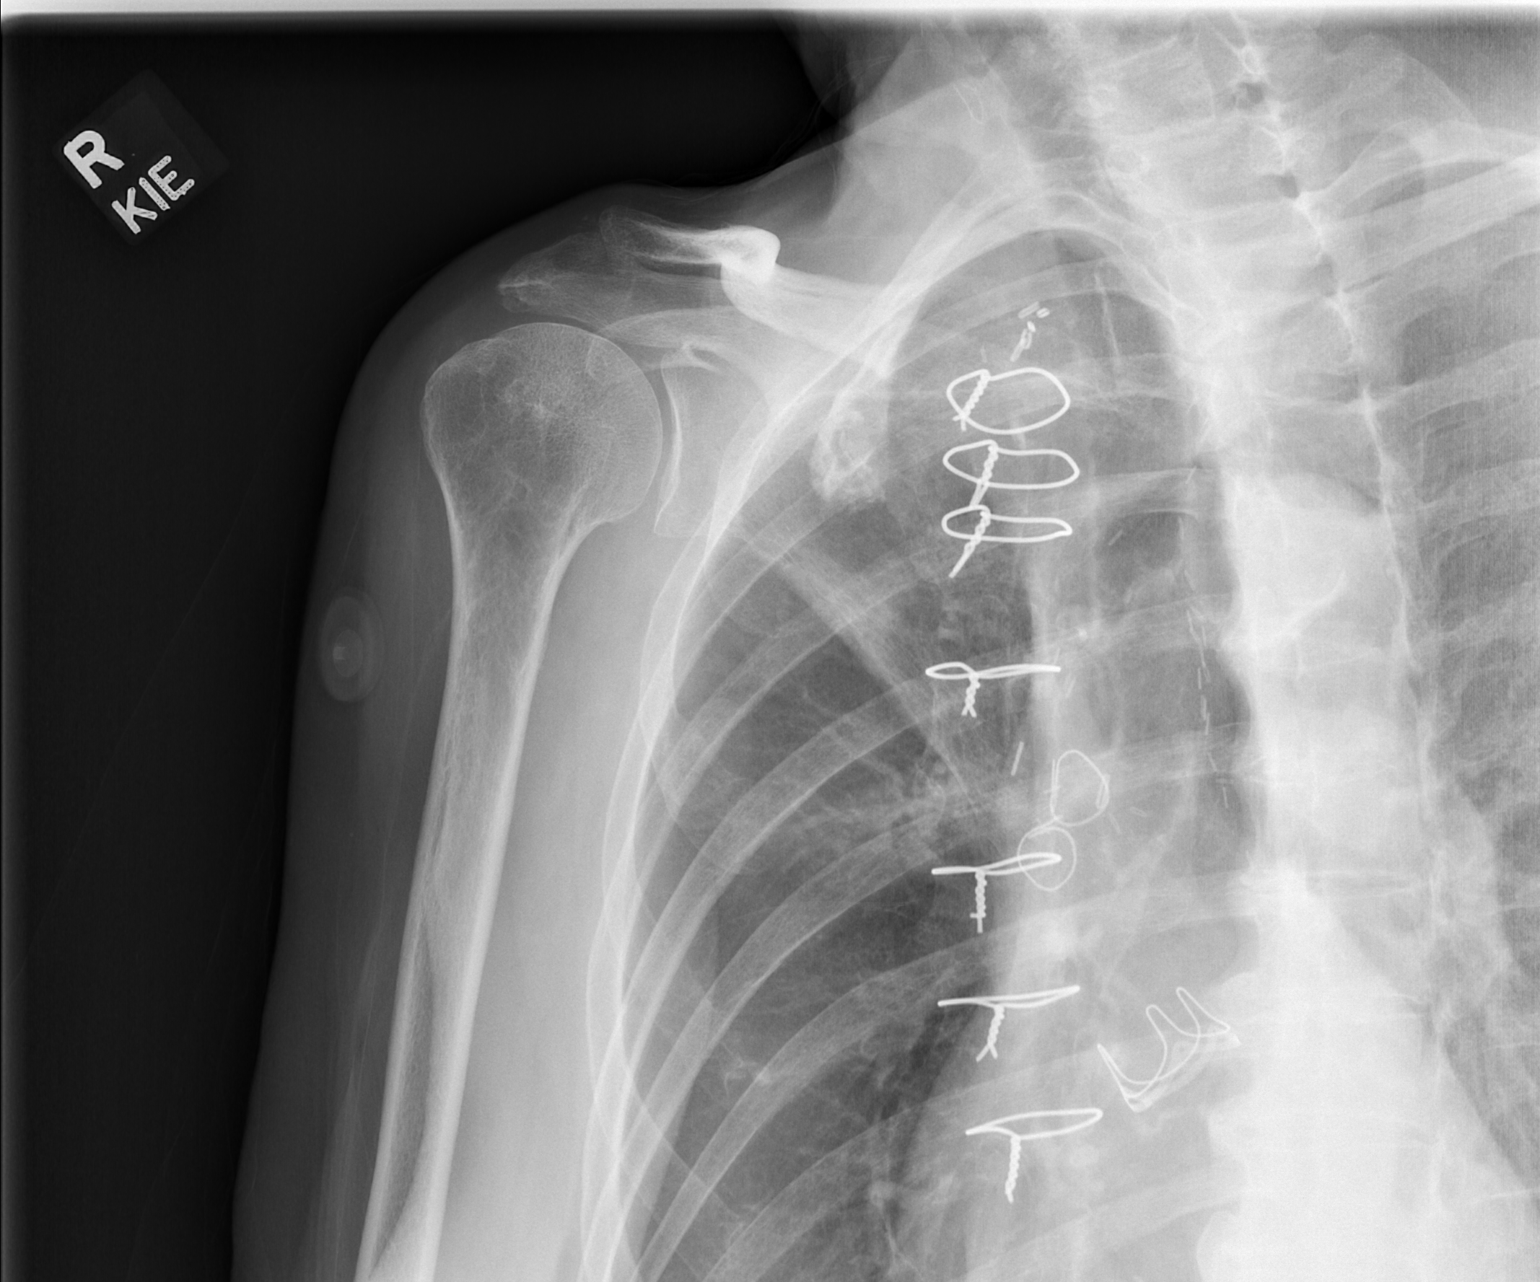

[w shoulder y view right]
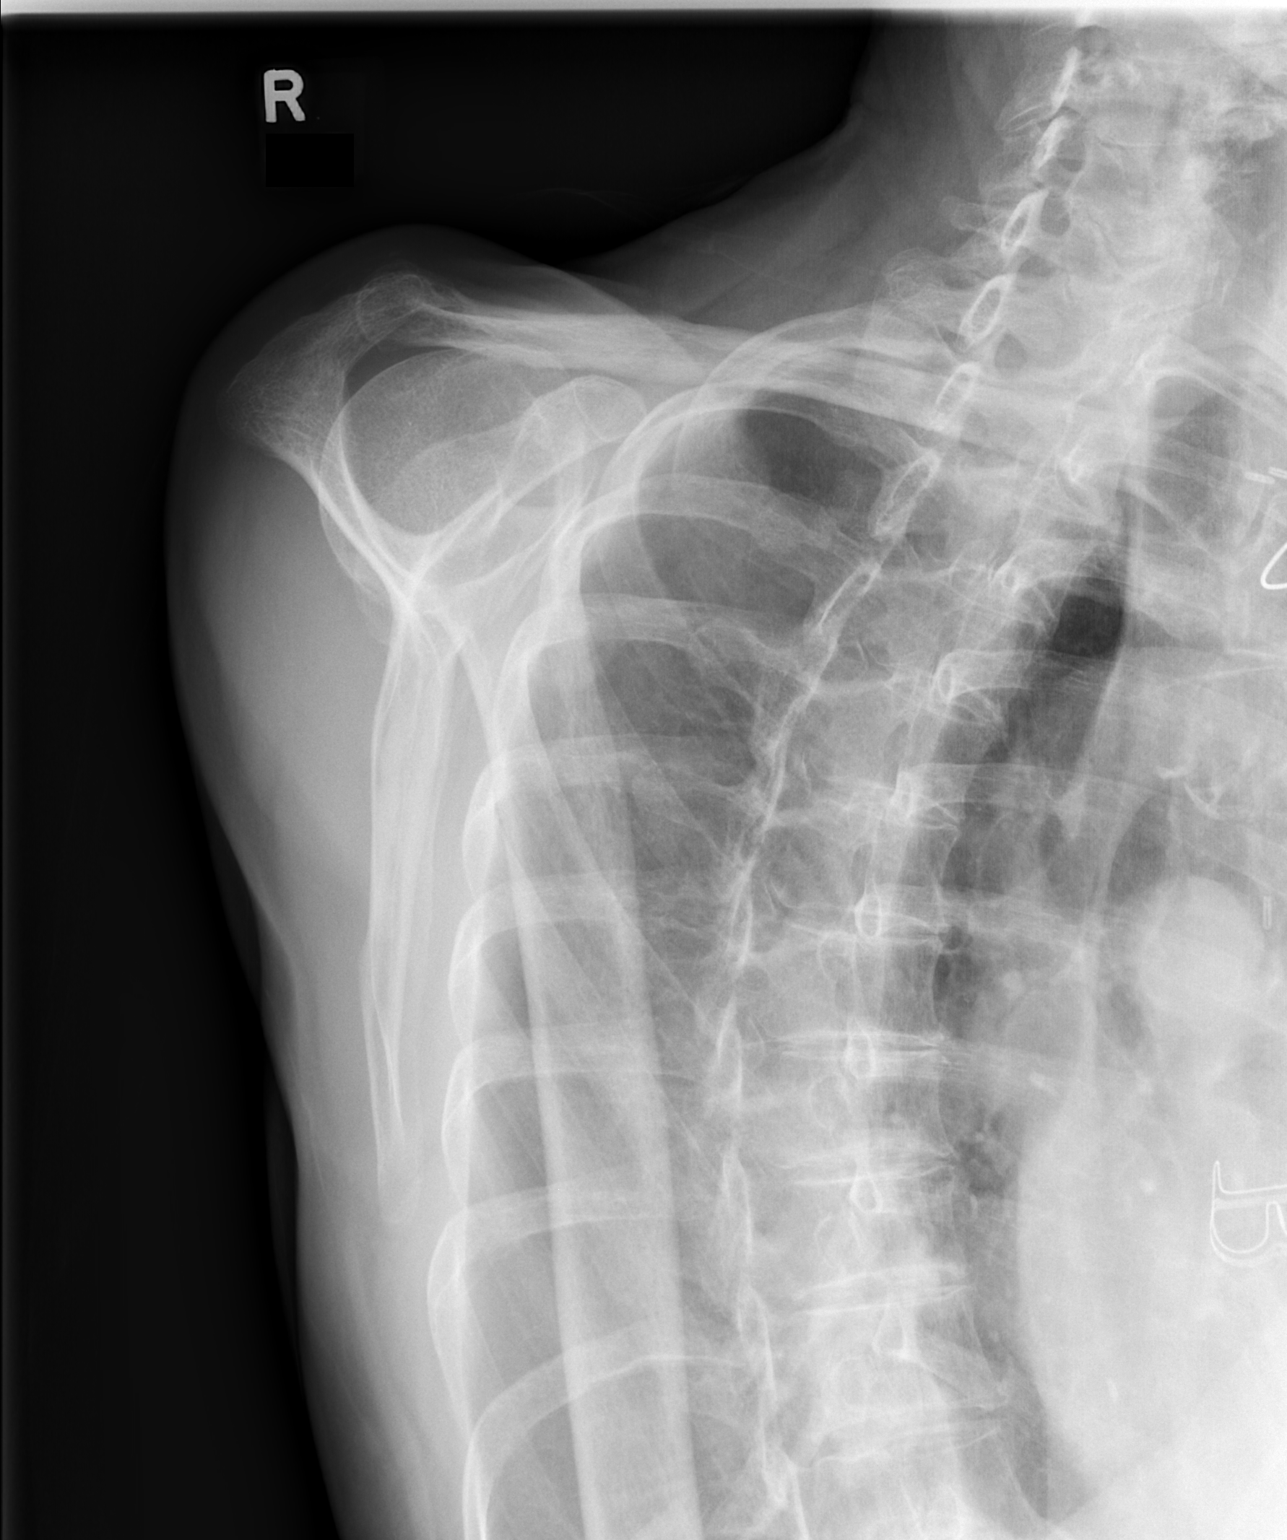

[w shoulder ap internal righ]
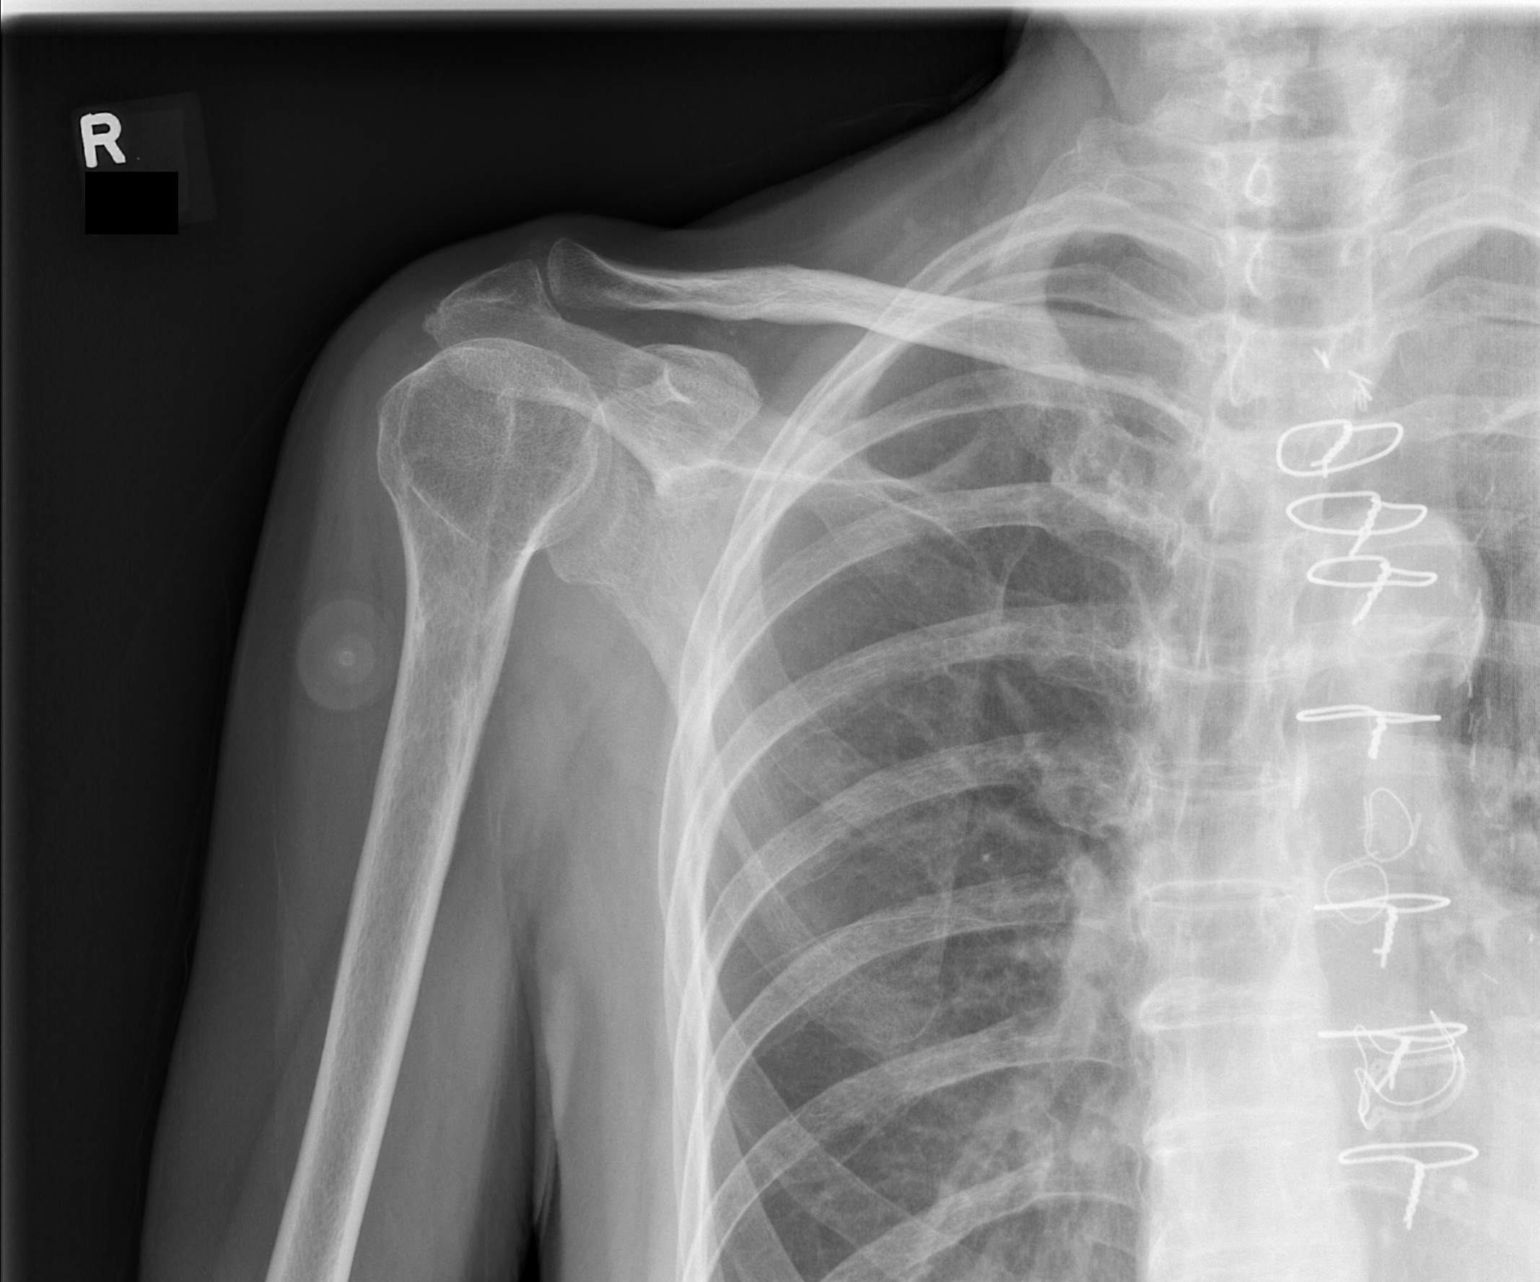

[3 of 3 positions shown; findings below may reference images not displayed]

FINDINGS: There is no evidence of fracture or dislocation. Mild arthropathy of
the right AC joint. Glenohumeral joint space appears slightly
narrowed. Soft tissues are unremarkable.
IMPRESSION: Negative.

## 2020-08-18 IMAGING — CT CT CERVICAL SPINE W/O CM
3 of 4 series · 9 of 33 positions shown, 11 images · non-contrast
Comparison: [DATE]

CLINICAL DATA: Head trauma, minor (Age >= 65y); Facial trauma; Neck
trauma (Age >= 65y). Mechanical fall 2 hours ago. Right facial
swelling

EXAM:
CT HEAD WITHOUT CONTRAST
CT MAXILLOFACIAL WITHOUT CONTRAST
CT CERVICAL SPINE WITHOUT CONTRAST
TECHNIQUE: Multidetector CT imaging of the head, cervical spine, and
maxillofacial structures were performed using the standard protocol
without intravenous contrast. Multiplanar CT image reconstructions
of the cervical spine and maxillofacial structures were also
generated.

[Series 5: sagittal bone · sagittal · 0.22mm/px · 5 of 66 slices shown, 6 images]
[im 22/66  bone]
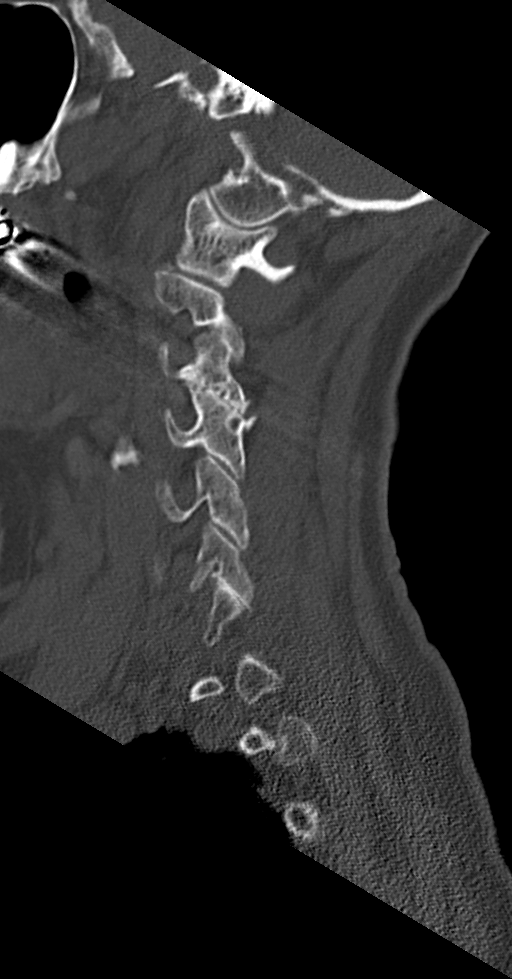
[im 28/66  bone]
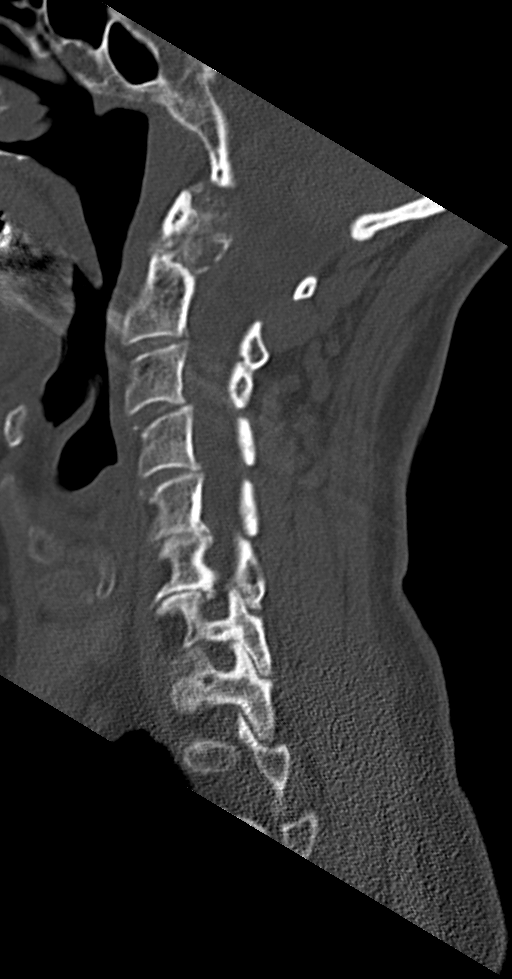
[im 33/66  soft-tissue]
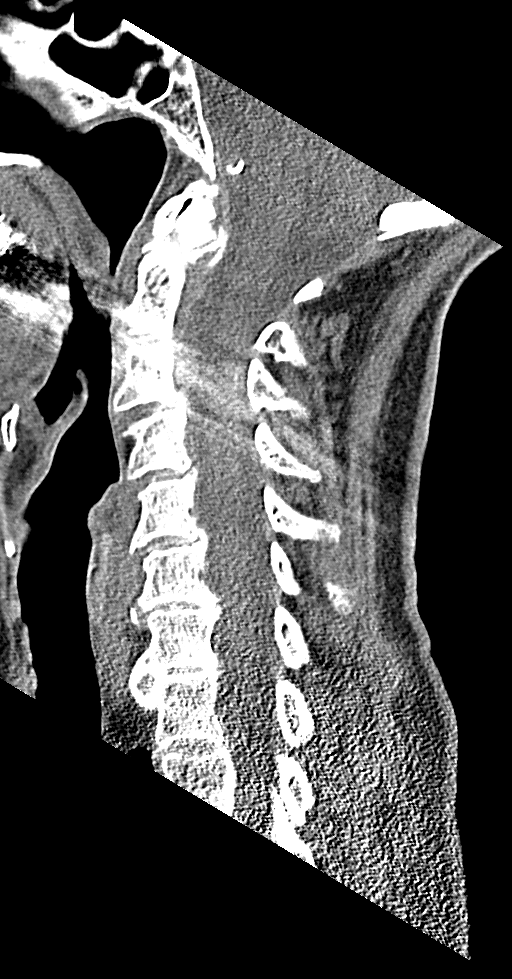
[im 33/66  bone]
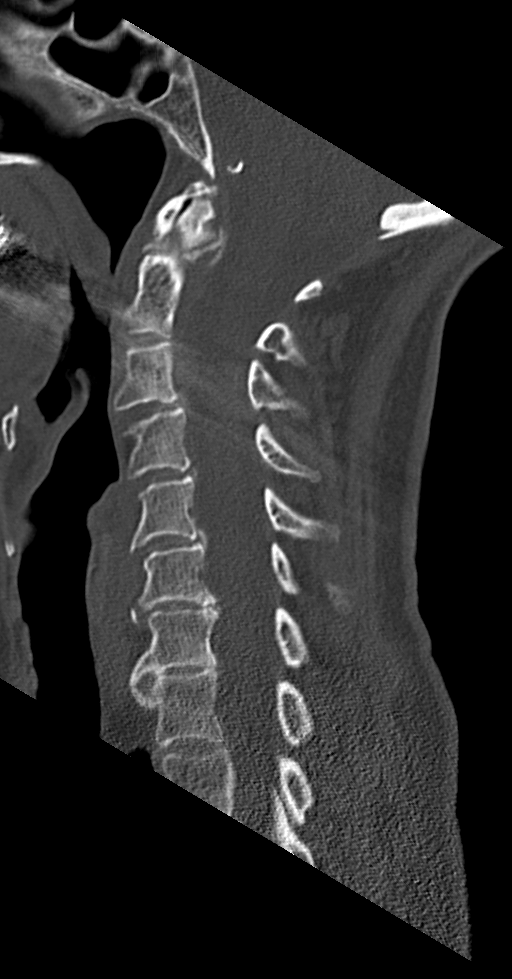
[im 38/66  bone]
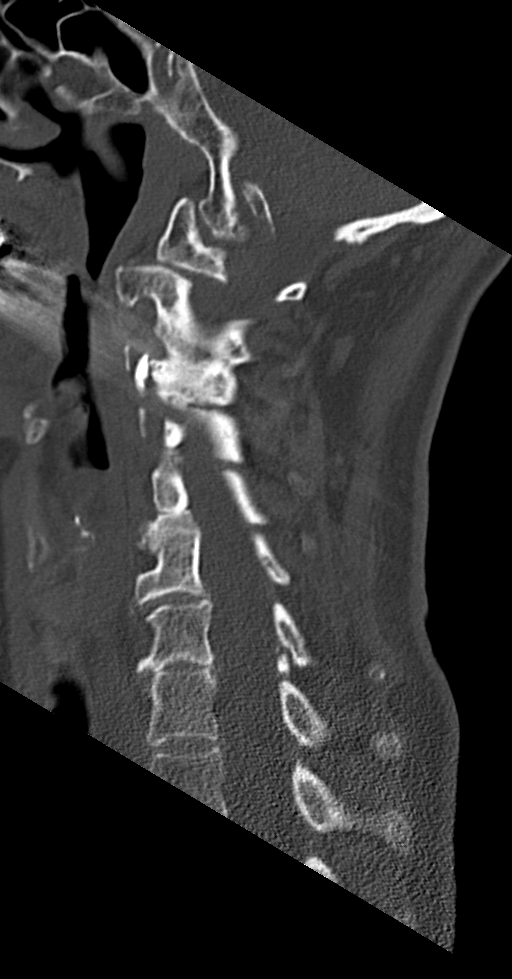
[im 44/66  bone]
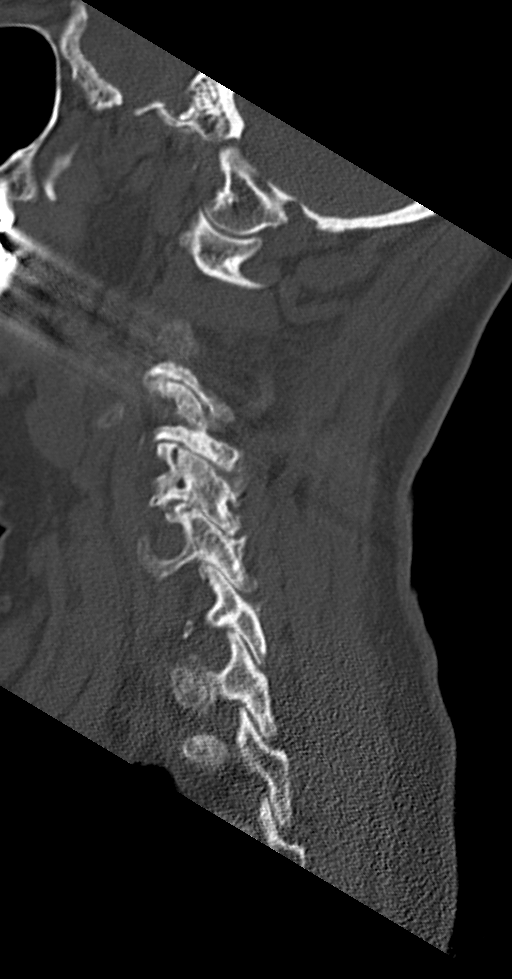

[Series 6: coronal bone · coronal · 0.26mm/px · 3 of 57 slices shown]
[im 14/57  bone]
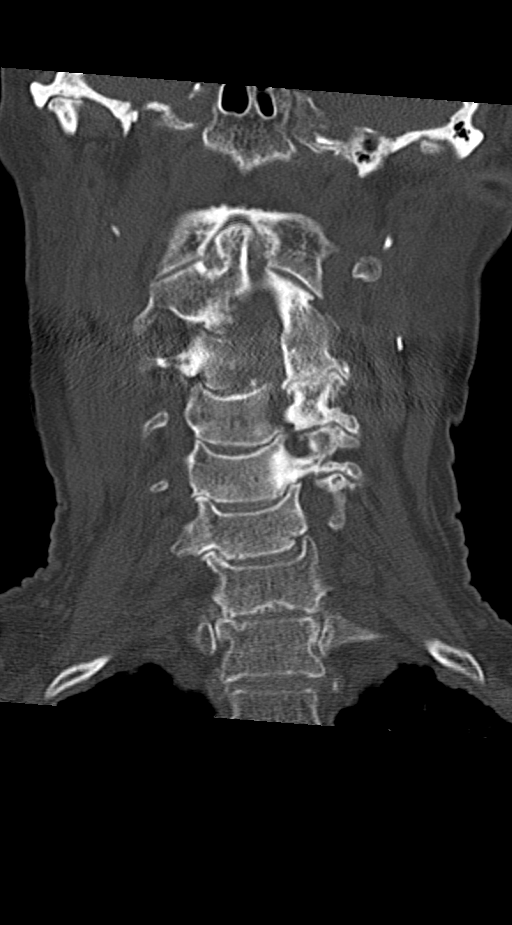
[im 24/57  bone]
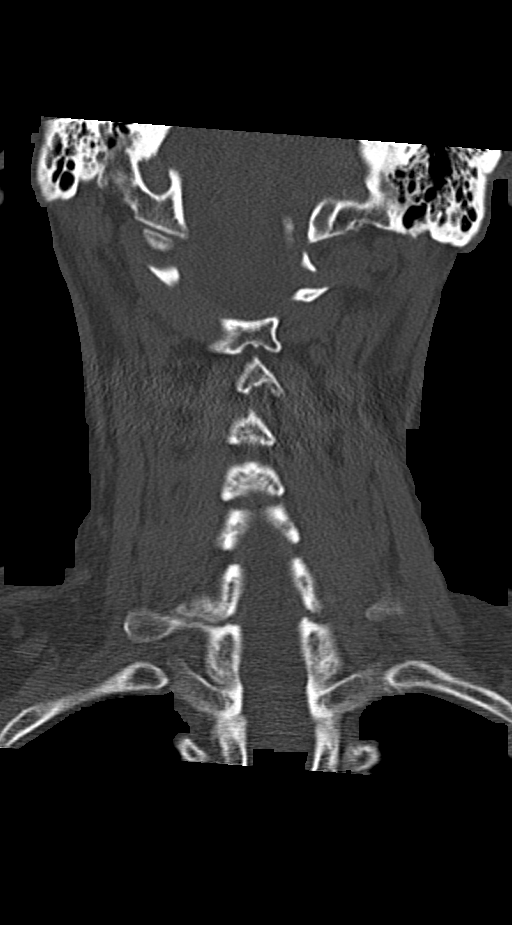
[im 33/57  bone]
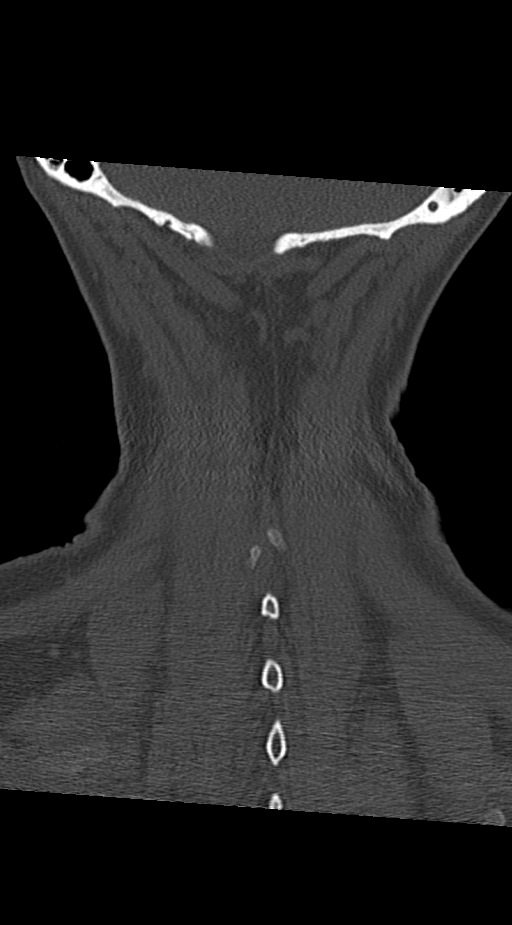

[Series 7: orthogonal axials · axial · 0.21mm/px · z∈[+425,+425]mm · 1 of 75 slices shown, 2 images]
[im 38/75  soft-tissue]
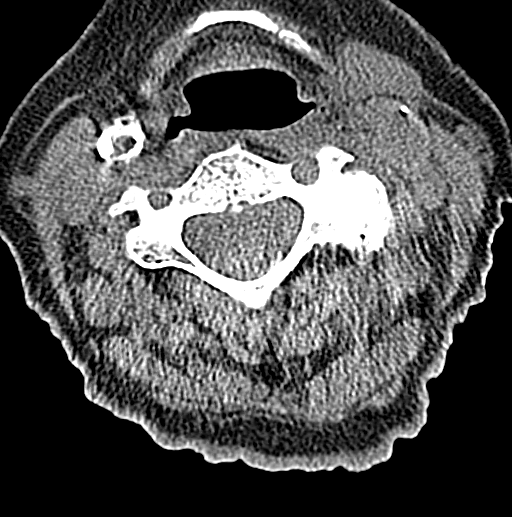
[im 38/75  bone]
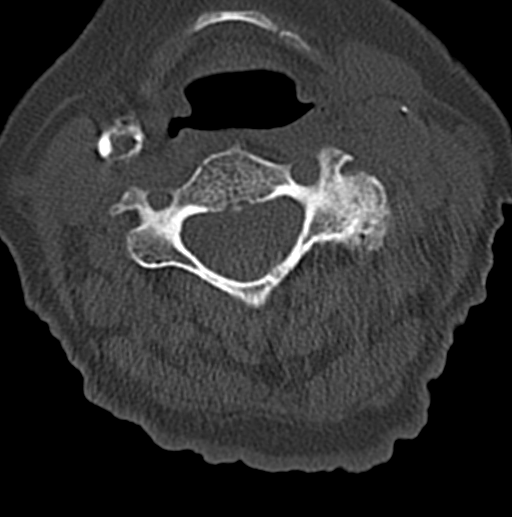

[9 of 33 positions shown; findings below may reference images not displayed]

FINDINGS: CT HEAD FINDINGS

Brain: No evidence of acute infarction, hemorrhage, hydrocephalus,
extra-axial collection or mass lesion/mass effect. Advanced
low-density changes within the periventricular and subcortical white
matter compatible with chronic microvascular ischemic change.
Moderate diffuse cerebral volume loss.

Vascular: Atherosclerotic calcifications involving the large vessels
of the skull base. No unexpected hyperdense vessel.

Skull: Normal. Negative for fracture or focal lesion.

Other: Negative for scalp hematoma.

CT MAXILLOFACIAL FINDINGS

Osseous: No acute maxillofacial bone fracture. Bony orbital walls
are intact. Mandible intact. Temporomandibular joints are aligned
without dislocation. Advanced degenerative changes of the bilateral
TMJs.

Orbits: Negative. No traumatic or inflammatory finding.

Sinuses: Clear.

Soft tissues: Prominent soft tissue swelling within the right
facial/maxillary soft tissues. No well-defined hematoma.

CT CERVICAL SPINE FINDINGS

Alignment: Facet joints are aligned without dislocation or traumatic
listhesis. Dens and lateral masses are aligned. Degenerative grade 1
anterolisthesis of C3 on C4.

Skull base and vertebrae: No acute fracture. No primary bone lesion
or focal pathologic process.

Soft tissues and spinal canal: No prevertebral fluid or swelling. No
visible canal hematoma.

Disc levels: Multilevel degenerative disc disease with uncovertebral
spurring. Bulky multilevel facet arthropathy is worse on the left.

Upper chest: Included lung apices are clear.

Other: None.
IMPRESSION: 1. No acute intracranial abnormality.
2. Advanced chronic small vessel ischemic disease and cerebral
volume loss.
3. No acute maxillofacial bone fracture. Prominent soft tissue
swelling within the right facial/maxillary soft tissues.
4. No acute fracture or traumatic listhesis of the cervical spine.
5. Multilevel degenerative disc disease and facet arthropathy of the
cervical spine.

## 2020-08-18 IMAGING — CT CT MAXILLOFACIAL W/O CM
3 series · 14 of 47 positions shown, 16 images · non-contrast
Comparison: [DATE]

CLINICAL DATA: Head trauma, minor (Age >= 65y); Facial trauma; Neck
trauma (Age >= 65y). Mechanical fall 2 hours ago. Right facial
swelling

EXAM:
CT HEAD WITHOUT CONTRAST
CT MAXILLOFACIAL WITHOUT CONTRAST
CT CERVICAL SPINE WITHOUT CONTRAST
TECHNIQUE: Multidetector CT imaging of the head, cervical spine, and
maxillofacial structures were performed using the standard protocol
without intravenous contrast. Multiplanar CT image reconstructions
of the cervical spine and maxillofacial structures were also
generated.

[Series 2: max soft · axial · 0.37mm/px · z∈[+414,+540]mm · 8 of 75 slices shown, 10 images]
[im 6/75  brain]
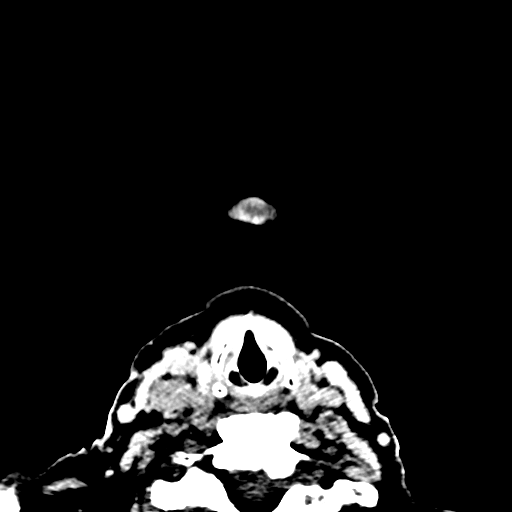
[im 6/75  bone]
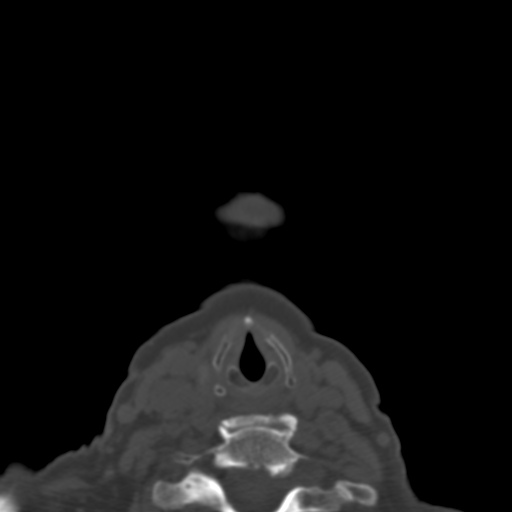
[im 16/75  bone]
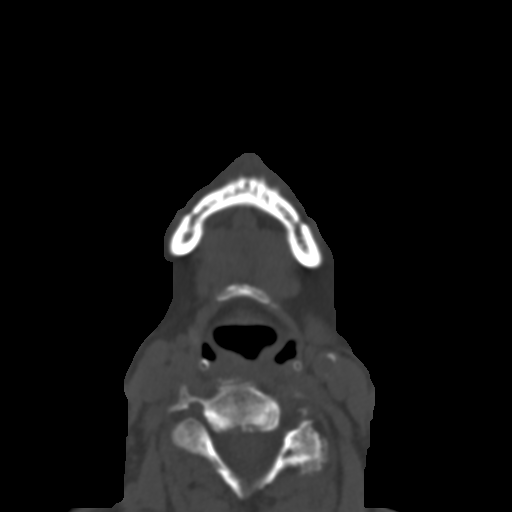
[im 23/75  bone]
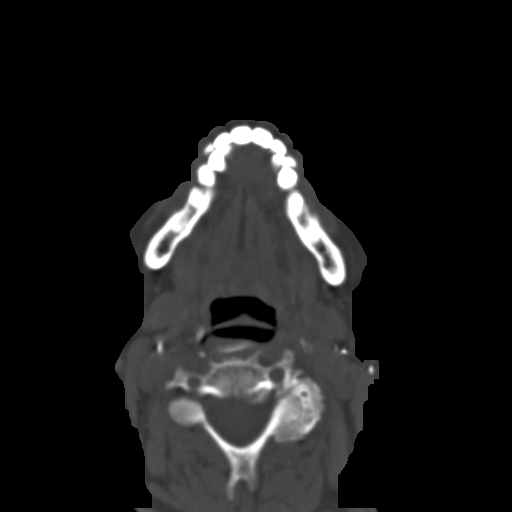
[im 34/75  bone]
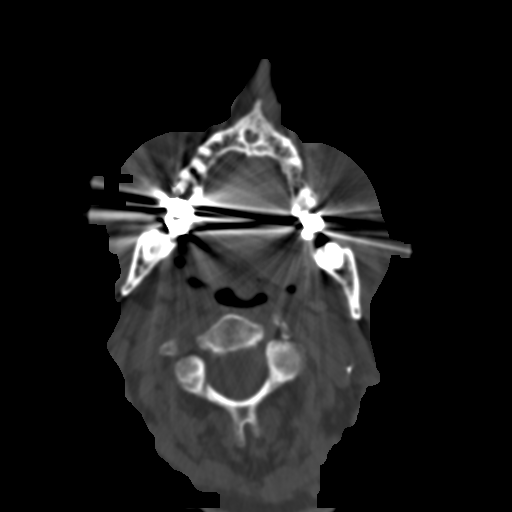
[im 41/75  brain]
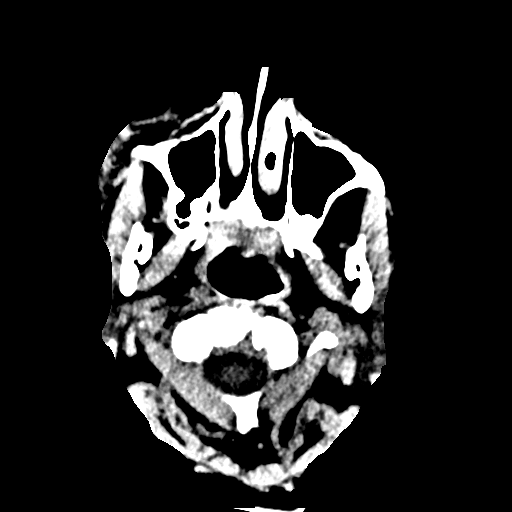
[im 41/75  bone]
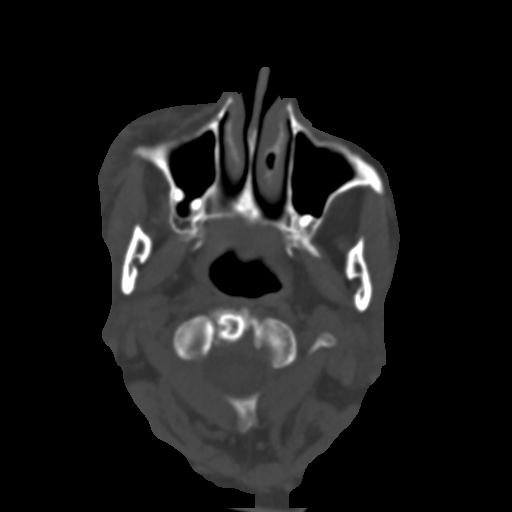
[im 52/75  bone]
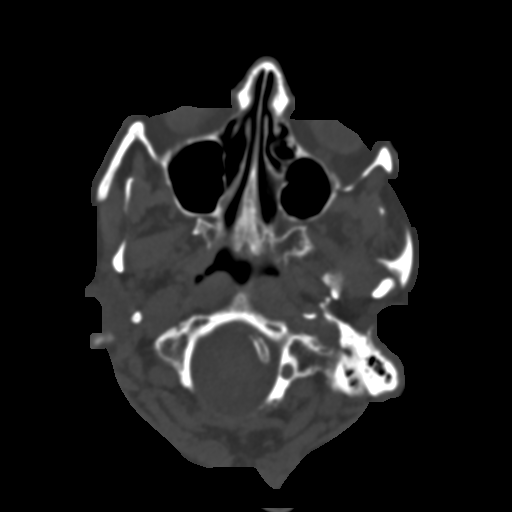
[im 59/75  bone]
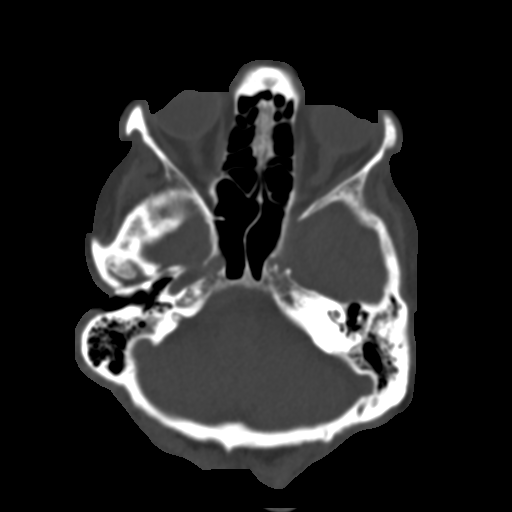
[im 69/75  bone]
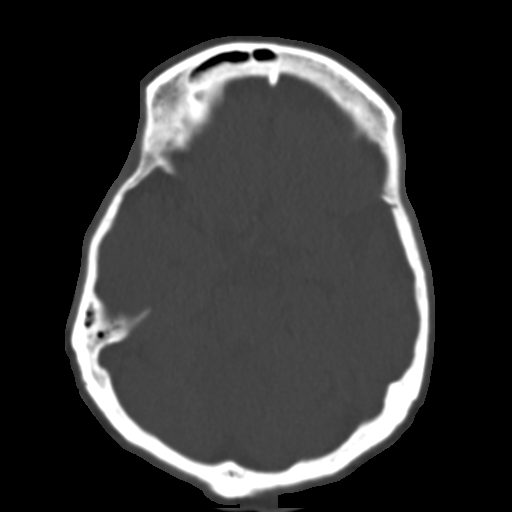

[Series 7: coronal soft · coronal · 0.31mm/px · 3 of 81 slices shown]
[im 27/81  bone]
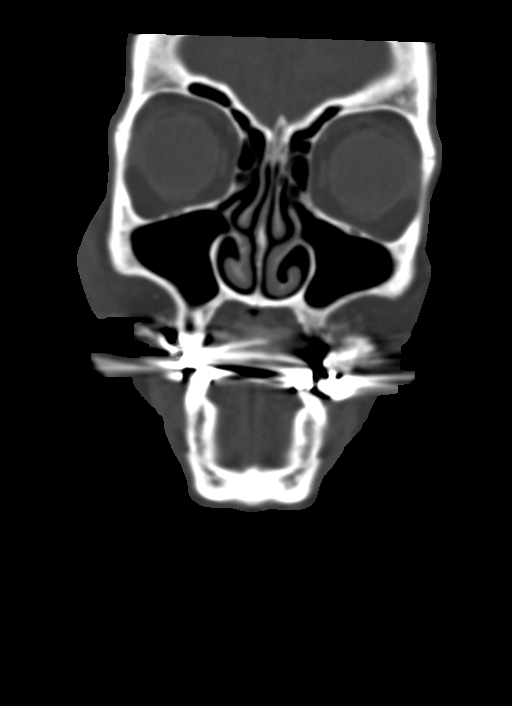
[im 36/81  bone]
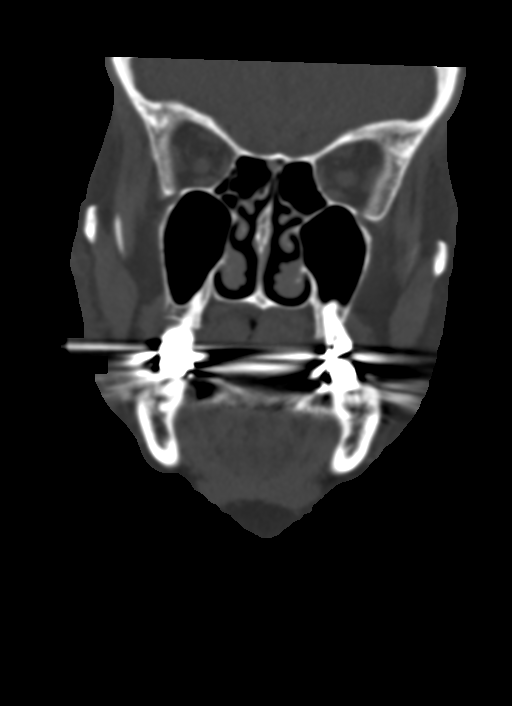
[im 45/81  bone]
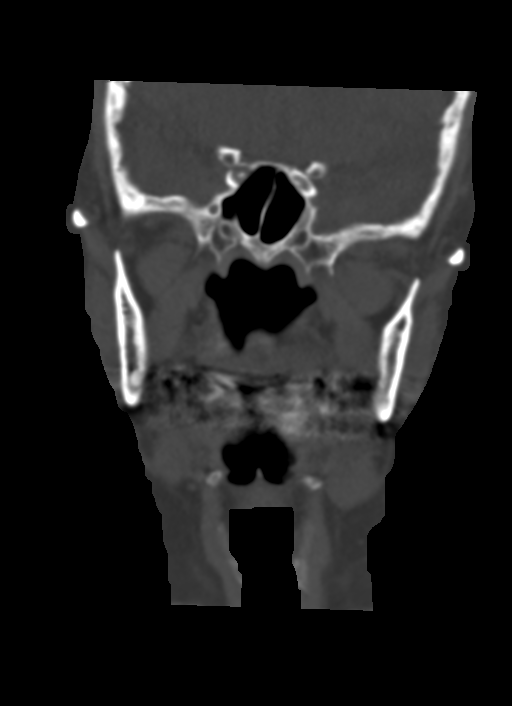

[Series 9: sagittal soft · sagittal · 0.34mm/px · 3 of 90 slices shown]
[im 30/90  bone]
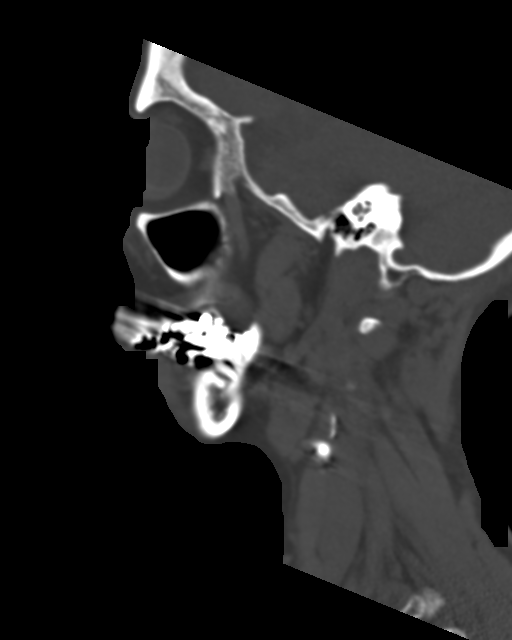
[im 45/90  bone]
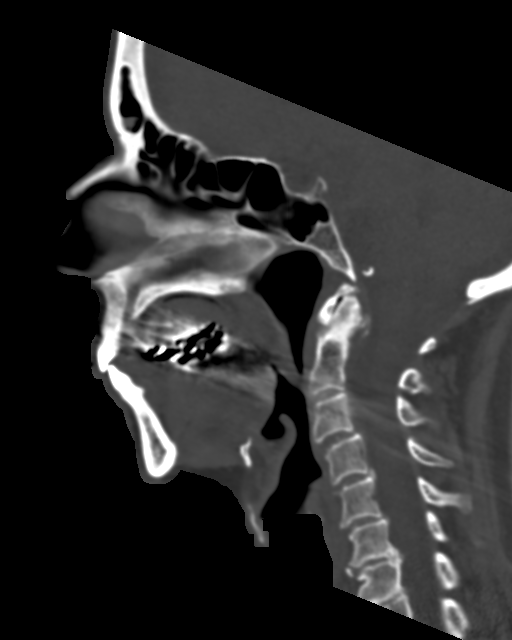
[im 60/90  bone]
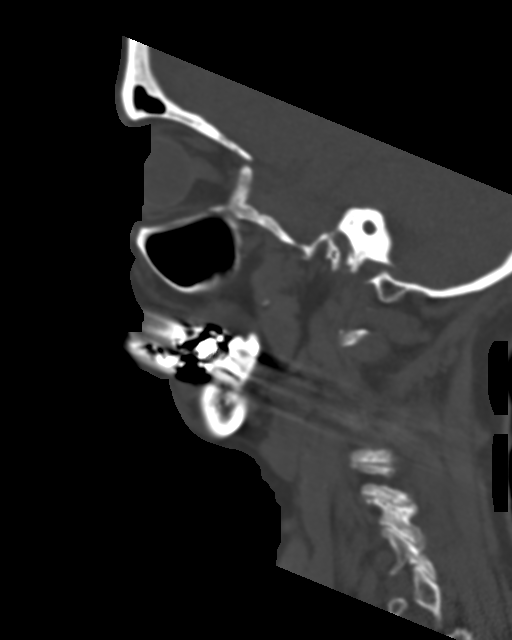

[14 of 47 positions shown; findings below may reference images not displayed]

FINDINGS: CT HEAD FINDINGS

Brain: No evidence of acute infarction, hemorrhage, hydrocephalus,
extra-axial collection or mass lesion/mass effect. Advanced
low-density changes within the periventricular and subcortical white
matter compatible with chronic microvascular ischemic change.
Moderate diffuse cerebral volume loss.

Vascular: Atherosclerotic calcifications involving the large vessels
of the skull base. No unexpected hyperdense vessel.

Skull: Normal. Negative for fracture or focal lesion.

Other: Negative for scalp hematoma.

CT MAXILLOFACIAL FINDINGS

Osseous: No acute maxillofacial bone fracture. Bony orbital walls
are intact. Mandible intact. Temporomandibular joints are aligned
without dislocation. Advanced degenerative changes of the bilateral
TMJs.

Orbits: Negative. No traumatic or inflammatory finding.

Sinuses: Clear.

Soft tissues: Prominent soft tissue swelling within the right
facial/maxillary soft tissues. No well-defined hematoma.

CT CERVICAL SPINE FINDINGS

Alignment: Facet joints are aligned without dislocation or traumatic
listhesis. Dens and lateral masses are aligned. Degenerative grade 1
anterolisthesis of C3 on C4.

Skull base and vertebrae: No acute fracture. No primary bone lesion
or focal pathologic process.

Soft tissues and spinal canal: No prevertebral fluid or swelling. No
visible canal hematoma.

Disc levels: Multilevel degenerative disc disease with uncovertebral
spurring. Bulky multilevel facet arthropathy is worse on the left.

Upper chest: Included lung apices are clear.

Other: None.
IMPRESSION: 1. No acute intracranial abnormality.
2. Advanced chronic small vessel ischemic disease and cerebral
volume loss.
3. No acute maxillofacial bone fracture. Prominent soft tissue
swelling within the right facial/maxillary soft tissues.
4. No acute fracture or traumatic listhesis of the cervical spine.
5. Multilevel degenerative disc disease and facet arthropathy of the
cervical spine.

## 2020-08-18 IMAGING — CR DG CHEST 2V
2 series · 2 of 2 positions shown · non-contrast
Comparison: [DATE]

CLINICAL DATA: Fall 2 hours ago with chest pain, initial encounter

EXAM:
CHEST - 2 VIEW

[w chest pa]
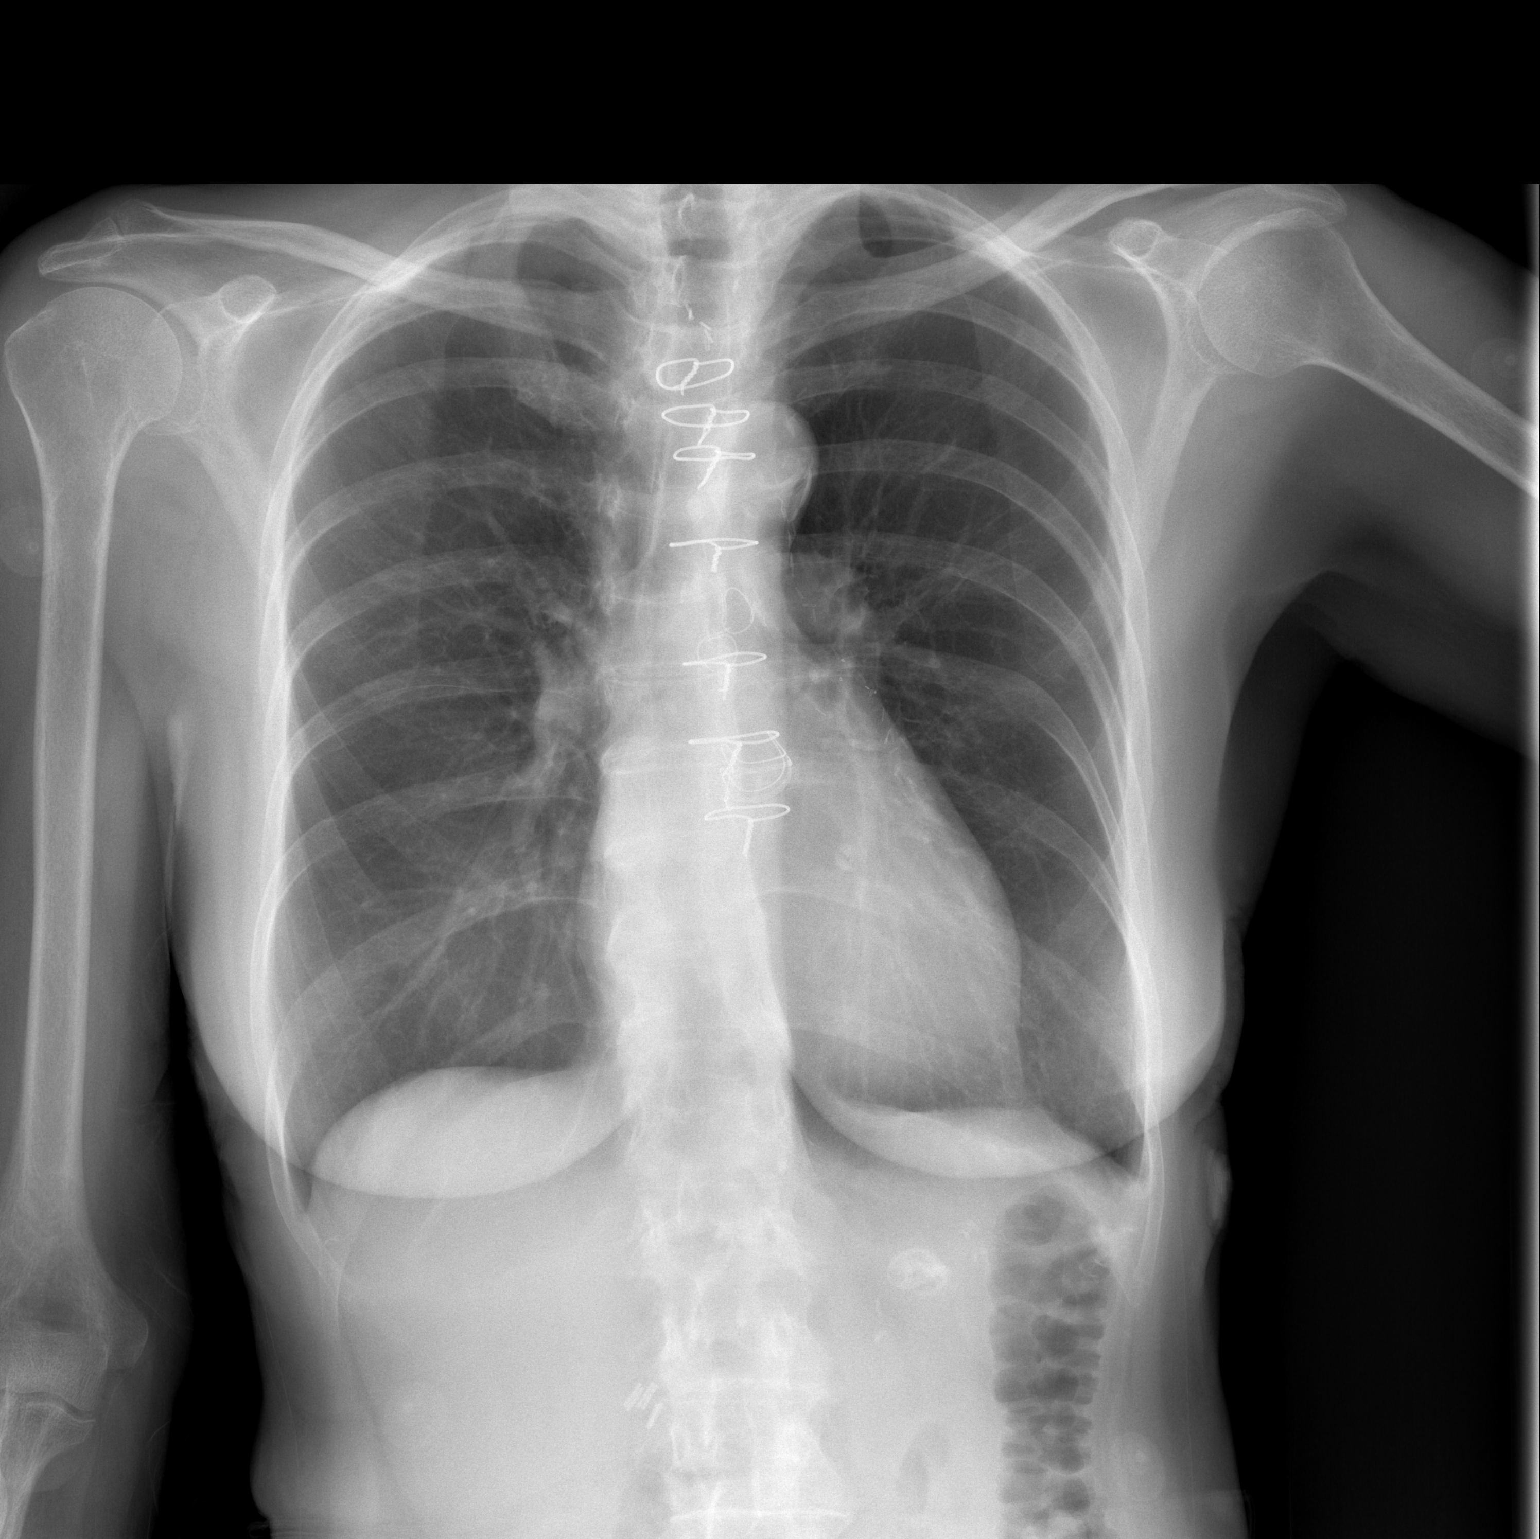

[w chest lat]
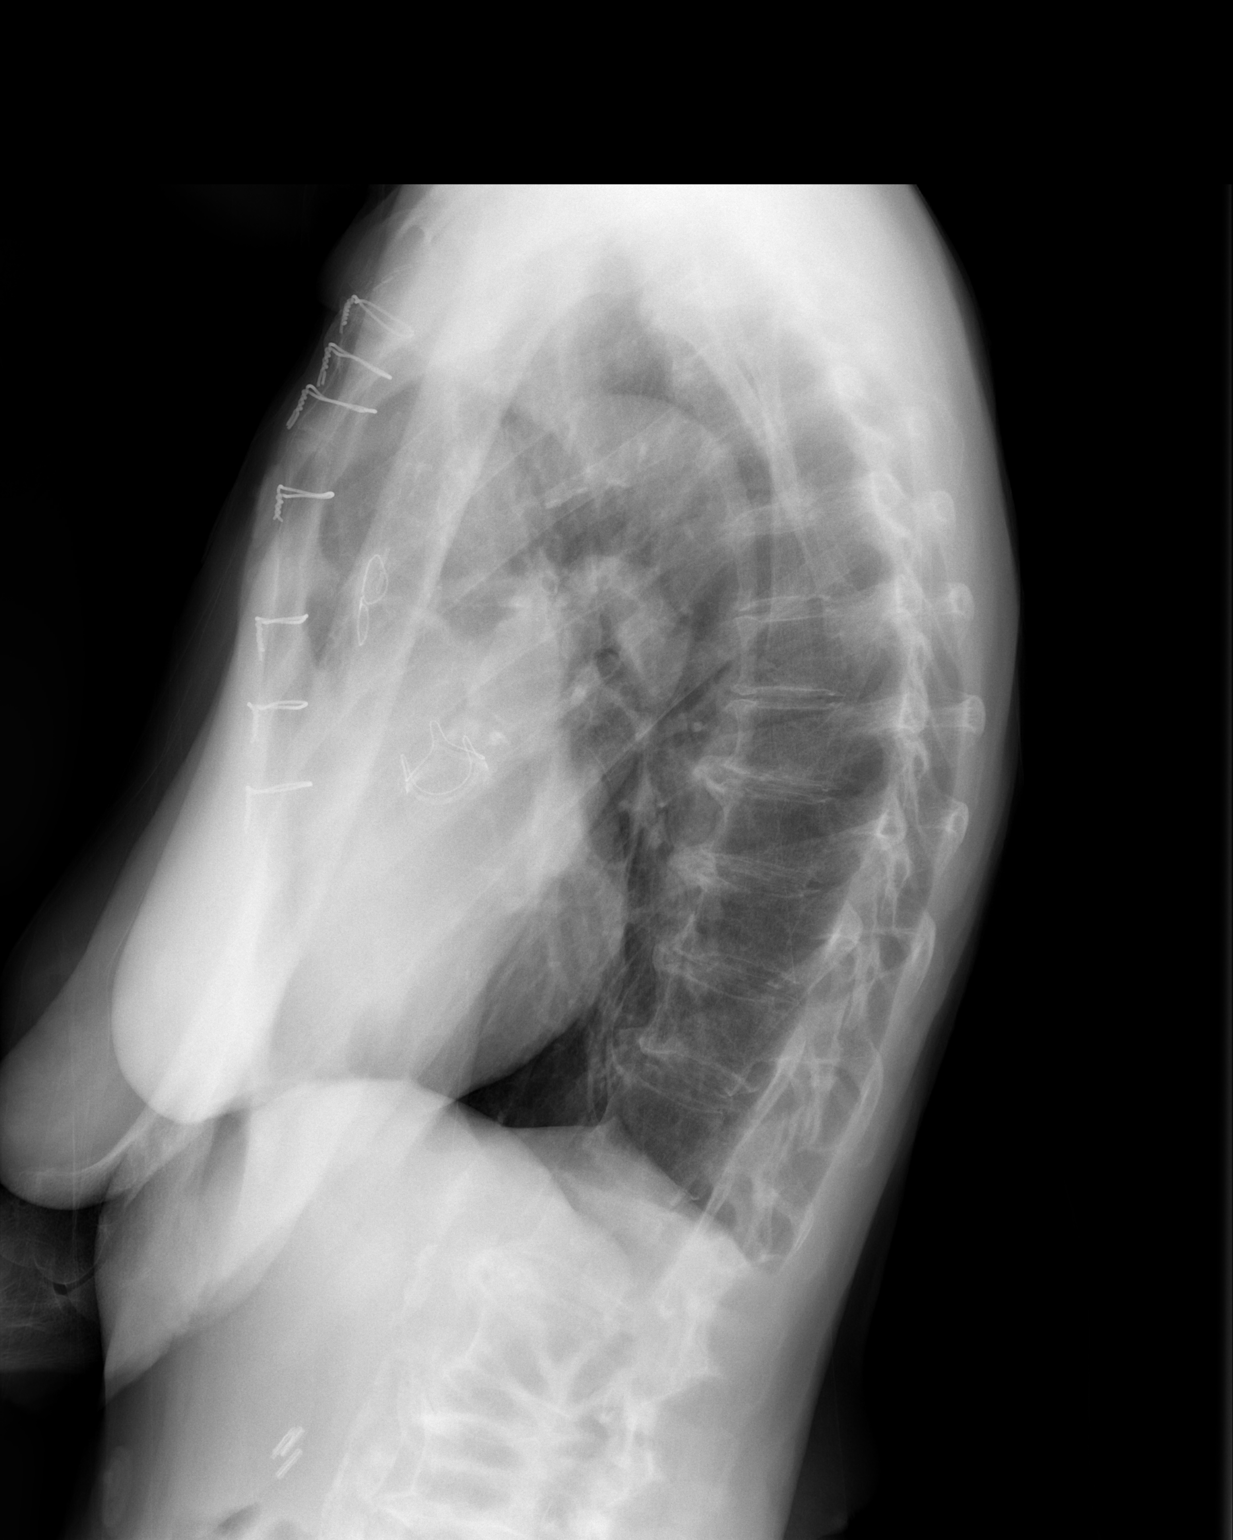

[2 of 2 positions shown; findings below may reference images not displayed]

FINDINGS: Cardiac shadow is within normal limits. Postsurgical changes are
again seen and stable. The lungs are well aerated without focal
infiltrate or effusion. No bony abnormality is noted.
IMPRESSION: No acute abnormality noted.

## 2020-08-18 IMAGING — CT CT HEAD W/O CM
3 series · 14 of 47 positions shown, 16 images · non-contrast
Comparison: [DATE]

CLINICAL DATA: Head trauma, minor (Age >= 65y); Facial trauma; Neck
trauma (Age >= 65y). Mechanical fall 2 hours ago. Right facial
swelling

EXAM:
CT HEAD WITHOUT CONTRAST
CT MAXILLOFACIAL WITHOUT CONTRAST
CT CERVICAL SPINE WITHOUT CONTRAST
TECHNIQUE: Multidetector CT imaging of the head, cervical spine, and
maxillofacial structures were performed using the standard protocol
without intravenous contrast. Multiplanar CT image reconstructions
of the cervical spine and maxillofacial structures were also
generated.

[Series 2: head wo · axial · 0.41mm/px · z∈[+509,+634]mm · 8 of 31 slices shown, 10 images]
[im 3/31  brain]
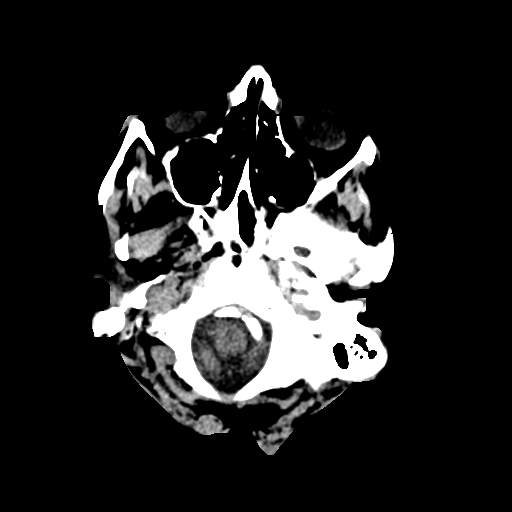
[im 3/31  bone]
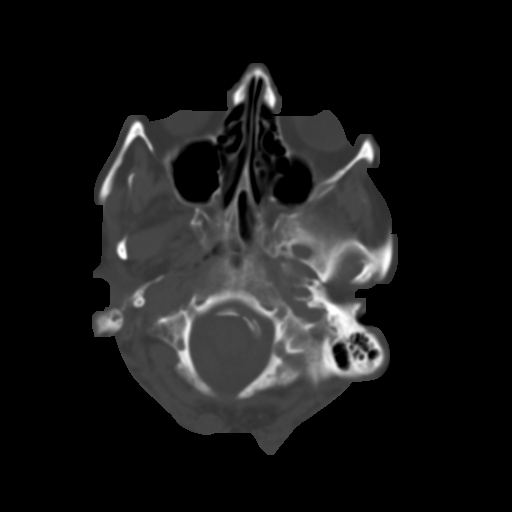
[im 7/31  brain]
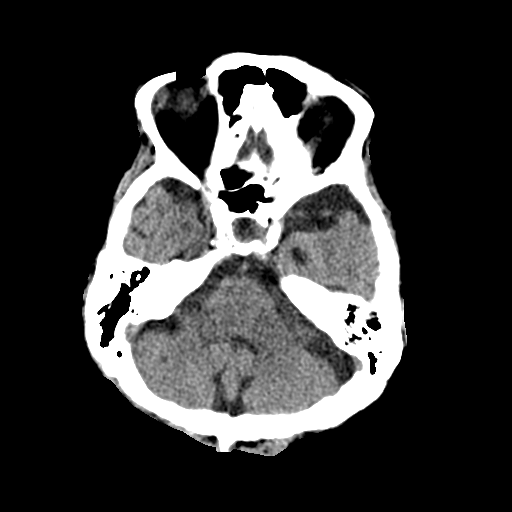
[im 10/31  brain]
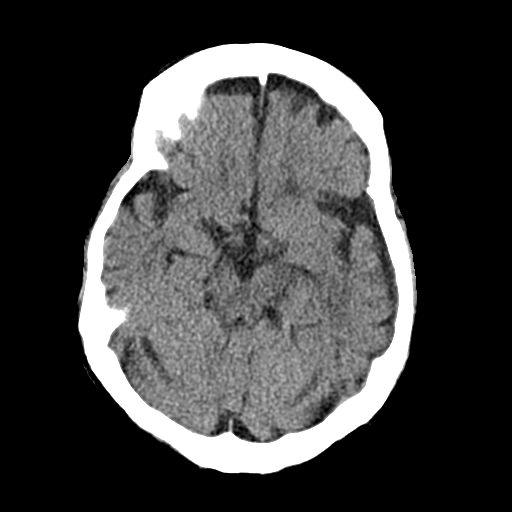
[im 14/31  brain]
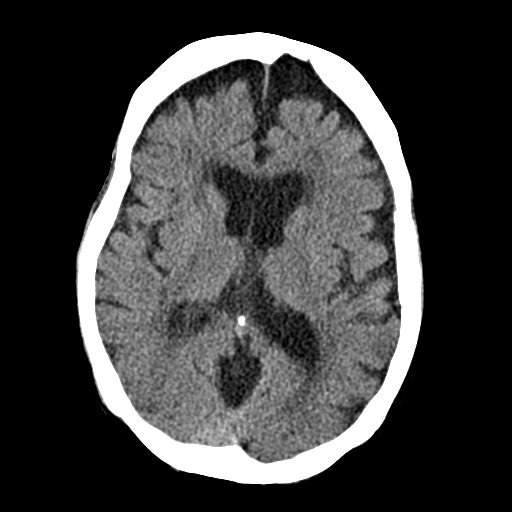
[im 17/31  brain]
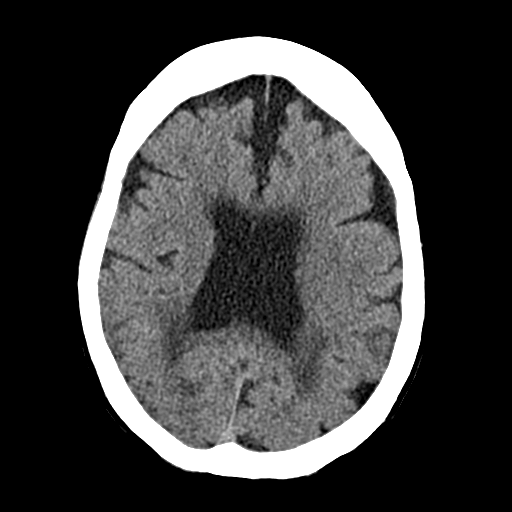
[im 17/31  bone]
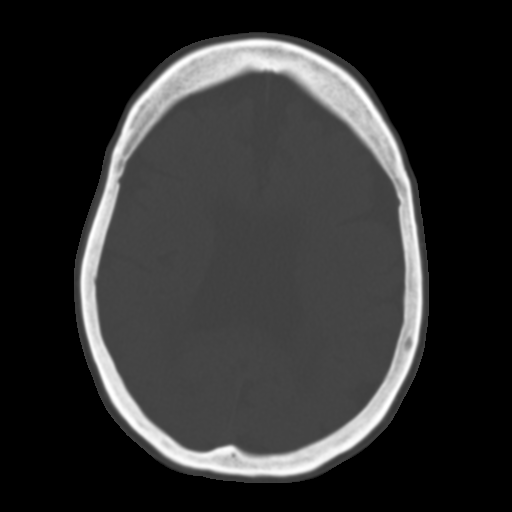
[im 21/31  brain]
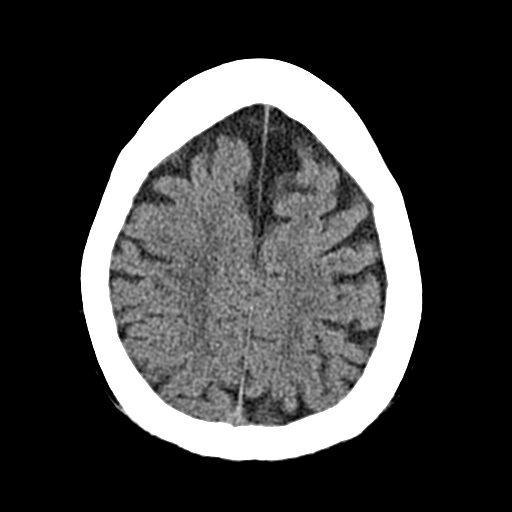
[im 24/31  brain]
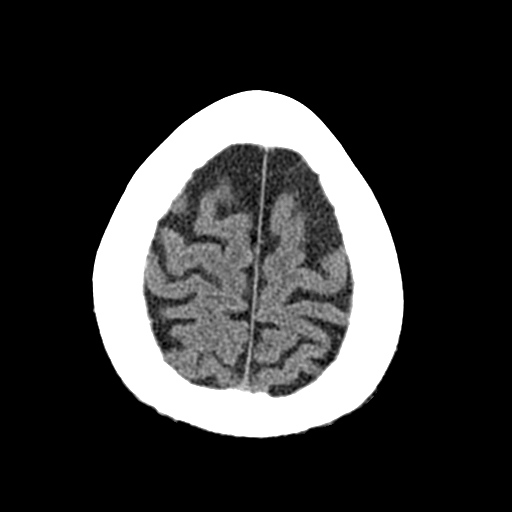
[im 28/31  brain]
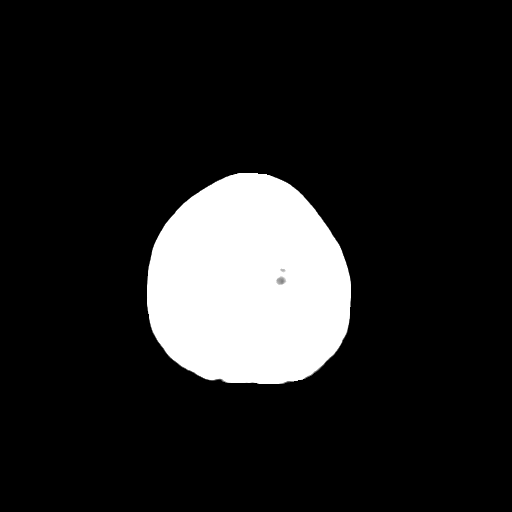

[Series 4: head coronal · coronal · 0.35mm/px · 3 of 84 slices shown]
[im 28/84  brain]
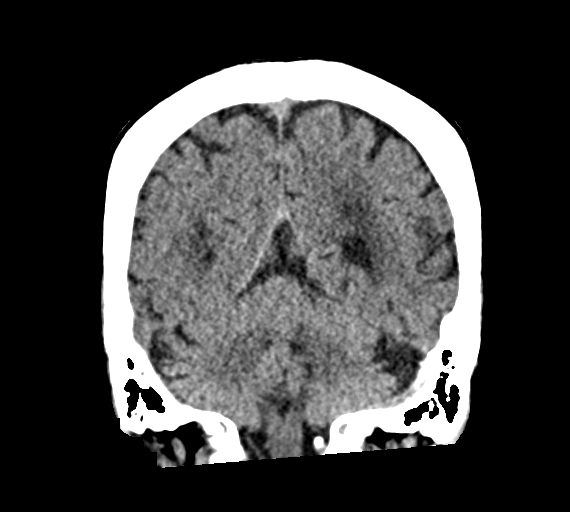
[im 37/84  brain]
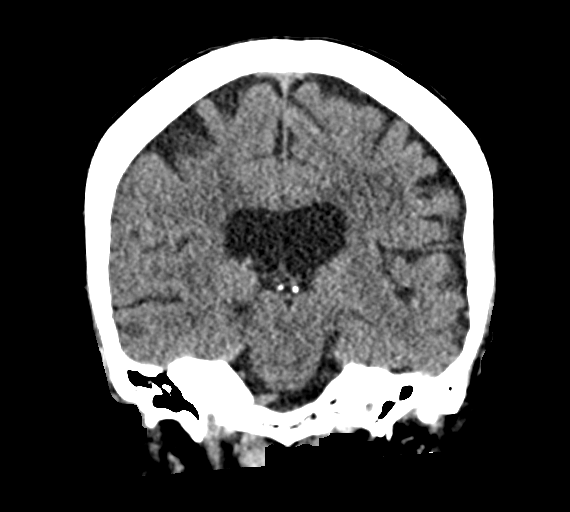
[im 47/84  brain]
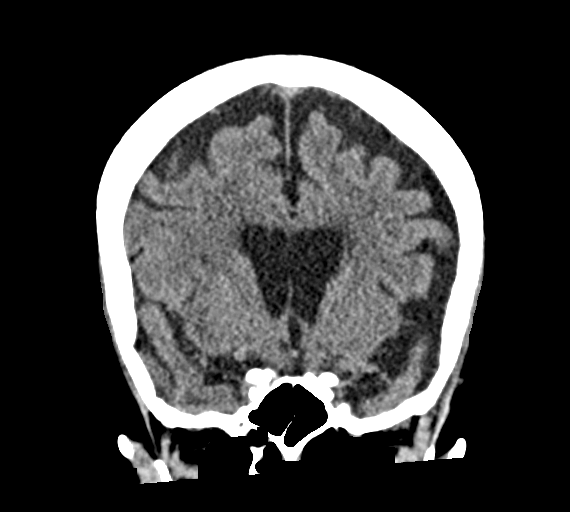

[Series 5: head sagittal · sagittal · 0.35mm/px · 3 of 57 slices shown]
[im 19/57  brain]
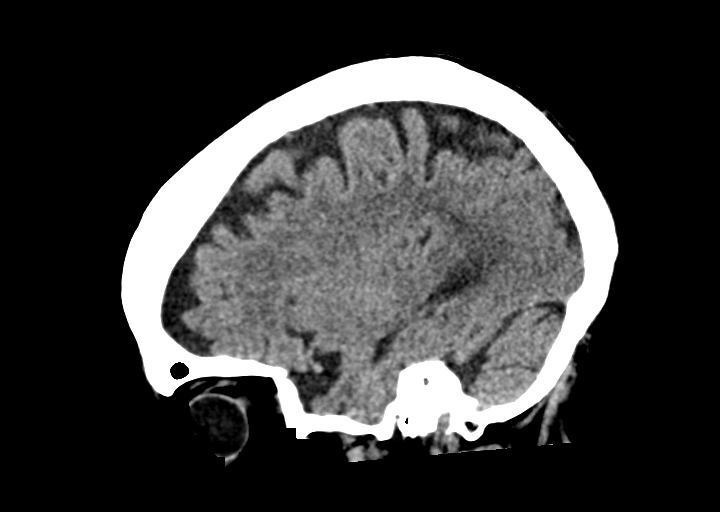
[im 29/57  brain]
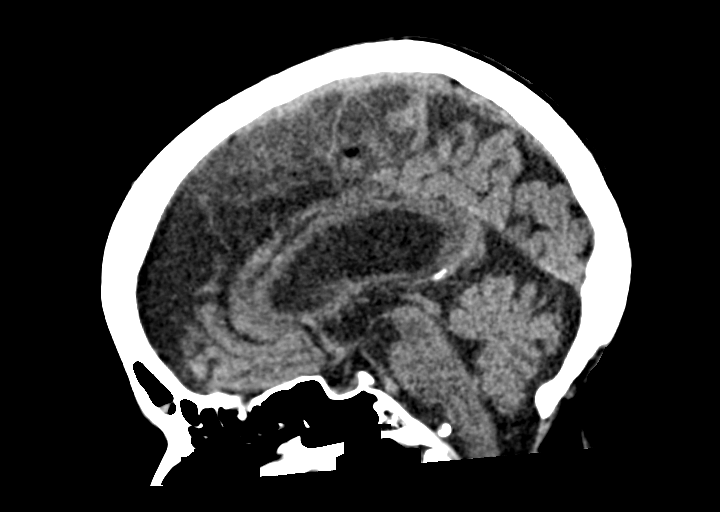
[im 38/57  brain]
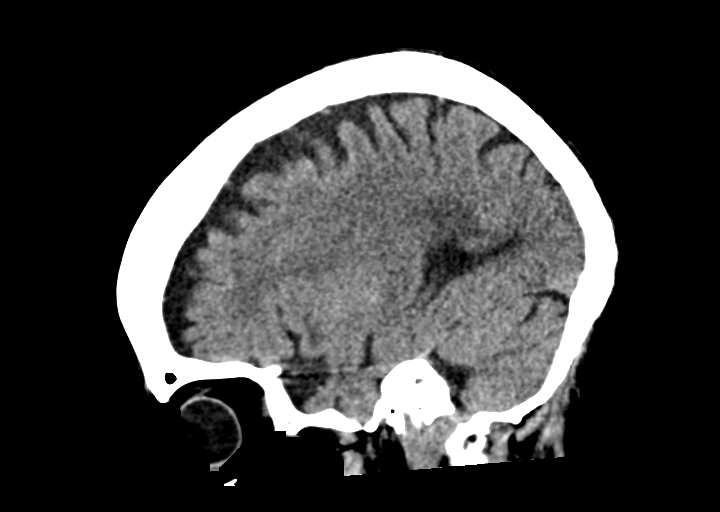

[14 of 47 positions shown; findings below may reference images not displayed]

FINDINGS: CT HEAD FINDINGS

Brain: No evidence of acute infarction, hemorrhage, hydrocephalus,
extra-axial collection or mass lesion/mass effect. Advanced
low-density changes within the periventricular and subcortical white
matter compatible with chronic microvascular ischemic change.
Moderate diffuse cerebral volume loss.

Vascular: Atherosclerotic calcifications involving the large vessels
of the skull base. No unexpected hyperdense vessel.

Skull: Normal. Negative for fracture or focal lesion.

Other: Negative for scalp hematoma.

CT MAXILLOFACIAL FINDINGS

Osseous: No acute maxillofacial bone fracture. Bony orbital walls
are intact. Mandible intact. Temporomandibular joints are aligned
without dislocation. Advanced degenerative changes of the bilateral
TMJs.

Orbits: Negative. No traumatic or inflammatory finding.

Sinuses: Clear.

Soft tissues: Prominent soft tissue swelling within the right
facial/maxillary soft tissues. No well-defined hematoma.

CT CERVICAL SPINE FINDINGS

Alignment: Facet joints are aligned without dislocation or traumatic
listhesis. Dens and lateral masses are aligned. Degenerative grade 1
anterolisthesis of C3 on C4.

Skull base and vertebrae: No acute fracture. No primary bone lesion
or focal pathologic process.

Soft tissues and spinal canal: No prevertebral fluid or swelling. No
visible canal hematoma.

Disc levels: Multilevel degenerative disc disease with uncovertebral
spurring. Bulky multilevel facet arthropathy is worse on the left.

Upper chest: Included lung apices are clear.

Other: None.
IMPRESSION: 1. No acute intracranial abnormality.
2. Advanced chronic small vessel ischemic disease and cerebral
volume loss.
3. No acute maxillofacial bone fracture. Prominent soft tissue
swelling within the right facial/maxillary soft tissues.
4. No acute fracture or traumatic listhesis of the cervical spine.
5. Multilevel degenerative disc disease and facet arthropathy of the
cervical spine.

## 2020-08-18 MED ORDER — AMOXICILLIN-POT CLAVULANATE 875-125 MG PO TABS: 1.0000 | ORAL_TABLET | Freq: Two times a day (BID) | ORAL | 0 refills | Status: AC

## 2020-08-18 MED ORDER — LIDOCAINE-EPINEPHRINE (PF) 2 %-1:200000 IJ SOLN: 20.0000 mL | Freq: Once | INTRAMUSCULAR | Status: DC

## 2020-08-18 MED ORDER — LIDOCAINE-EPINEPHRINE (PF) 2 %-1:200000 IJ SOLN
20.0000 mL | Freq: Once | INTRAMUSCULAR | Status: AC
  Administered 2020-08-18: 20 mL

## 2020-08-18 NOTE — ED Triage Notes (Signed)
Pt arrives pov with c/o mechanical fall 2 hrs pta. Pt endorses blood thinners. Bruising and swelling noted to R cheek, laceration to R upper lip. Pt also reports R shoulder pain, bruising noted. Pt denies loc

## 2020-08-18 NOTE — Discharge Instructions (Addendum)
Continue taking home medications as prescribed. Take antibiotics as prescribed to prevent infection. You may get the area wet with clean water. If you have bleeding, hold firm constant pressure for 20 minutes.  If it is still bleeding, return to the ER. You will have swelling and bruising for the next several days.  Use ice to help with this. Use Tylenol as needed for pain control. Try to eat soft foods, mostly on the left side of your mouth.  Do not use straws as this will increase her risk of ripping the stitches. Return to the emergency room or go to the urgent care or primary care office in 5 to 7 days for suture removal. Return to the ER with any new, worsening, or concerning symptoms

## 2020-08-18 NOTE — ED Provider Notes (Signed)
[place] EMERGENCY DEPARTMENT Provider Note   CSN: [id number] Arrival date & time: 08/18/20  1732     History Chief Complaint  Patient presents with   Nicole Mercado is a 80 y.o. female presenting for evaluation after a fall.  Patient states she was at the grocery store when she suddenly had a syncopal event.  No symptoms prior to syncope, no chest pain, shortness of breath, palpitations, lightheadedness, dizziness, nausea.  She landed on her right side.  She does take a blood thinner, but does not know what it is.  She is able to stand up and ambulate and drive herself home without difficulty.  She reports pain in her right shoulder, right cheek, and right wrist.  She denies headache, neck pain, back pain, chest pain, abdominal pain, a loss of bowel bladder control, numbness, or tingling.  She lives at home by herself, does not use a walker or cane. Pt states she has several syncopal events previously, all prior to her open heart surgery.   Addition history obtained from chart review.  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 Fib. No anticoagulant listed in pt's chart, but she does have a h/o thrombocytopenia.   HPI     Past Medical History:  Diagnosis Date   Aortic stenosis    a. Echocardiogram (01/11/14):  Mild LVH, EF 60-65%, Gr 1 DD, possible bicuspid AV, severe AS (mean 61 mmHg, peak 104 mmHg), mild MVP of post leaflet, mild MR, PASP 33 mmHg.;  b. 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 LHC (01/13/14):  dLM 70 extending into oLAD, mLAD 40-50, pD1 80, pD2 70, ostial/prox OM1 70 >> CABG (L-LAD, S-OM1, S-D1, S-D2)     Carotid artery occlusion    a. s/p L CEA 2013;  b.  Carotid US (1/16):  Bilateral ICA 1-39%   CLL (chronic lymphocytic leukemia) (HCC)    slow leukemia---Dr  Murinson   H/O exercise stress test    NSSTT   Hx of  cardiovascular stress test    a. Nuclear stress test That (4/13): Normal perfusion, EF 74%   Hx of echocardiogram    Echo (2/16):  Mild LVH, EF 60-65%, no RWMA, Gr 2 DD, AVR ok (mean 9 mmHg), trivial AI, mild MR, mild LAE, PASP 40 mmHg   Hypertension    Pancreatitis    h/o   Thrombocytopenia ([place]) 11/19/2010    Patient Active Problem List   Diagnosis Date Noted   CKD (chronic kidney disease), stage III ([place]) 03/25/2019   Basal cell carcinoma (BCC) of ala nasi 02/11/2019   PAC (premature atrial contraction) 01/14/2019   Goals of care, counseling/discussion 12/08/2018   Skin lesion 12/18/2016   Hypercalcemia 06/19/2016   Drug-induced neutropenia ([place]) 05/27/2016   Hematuria, gross 08/28/2015   Easy bruising 05/31/2015   Encounter for chemotherapy management 11/03/2014   Essential hypertension 10/30/2014   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   S/P AVR 01/17/2014   CAD (coronary artery disease), native coronary artery 01/16/2014   Syncope 01/11/2014   Aortic stenosis 01/11/2014   Arrhythmia 01/11/2014   Syncope and collapse 01/11/2014   Faintness    Aftercare following surgery of the circulatory system, NEC 08/18/2013   Occlusion and stenosis of carotid artery without mention of cerebral infarction 06/03/2011   CLL (  chronic lymphocytic leukemia) ([place]) 03/21/2011   Cardiac murmur 03/21/2011   Carotid artery stenosis 03/21/2011   Thrombocytopenia ([place]) 11/19/2010   Lymphadenopathy 11/11/2010    Past Surgical History:  Procedure Laterality Date   ABDOMINAL HYSTERECTOMY     AORTIC VALVE REPLACEMENT [name] 01/17/2014   Procedure: AORTIC VALVE REPLACEMENT (AVR);  Surgeon: Gaye Pollack, MD;  Location: [facility];  Service: Open Heart Surgery;  Laterality: [name];   APPENDECTOMY     CAROTID ENDARTERECTOMY  06/09/11   LEFT  cea   CESAREAN SECTION     FIVE   CHOLECYSTECTOMY     CORONARY ARTERY BYPASS GRAFT [name] 01/17/2014   Procedure: CORONARY ARTERY BYPASS GRAFTING (CABG)TIMES FOUR USING  LEFT INTERNAL MAMMARY ARTERY AND RIGHT SAPHENOUS VEIN HARVESTED ENDOSCOPICALLY;  Surgeon: Gaye Pollack, MD;  Location: [facility];  Service: Open Heart Surgery;  Laterality: [name];   ENDARTERECTOMY  06/09/2011   Procedure: ENDARTERECTOMY CAROTID;  Surgeon: Rosetta Posner, MD;  Location: [facility];  Service: Vascular;  Laterality: Left;  left carotid endarterectomy with patch angioplasty   LEFT AND RIGHT HEART CATHETERIZATION WITH CORONARY ANGIOGRAM [name] 01/13/2014   Procedure: LEFT AND RIGHT HEART CATHETERIZATION WITH CORONARY ANGIOGRAM;  Surgeon: Jettie Booze, MD;  Location: [facility] CATH LAB;  Service: Cardiovascular;  Laterality: [name];   TEE WITHOUT CARDIOVERSION [name] 01/17/2014   Procedure: TRANSESOPHAGEAL ECHOCARDIOGRAM (TEE);  Surgeon: Gaye Pollack, MD;  Location: [facility];  Service: Open Heart Surgery;  Laterality: [name];   TONSILLECTOMY       OB History   No obstetric history on file.     Family History  Problem Relation Age of Onset   Heart disease Mother        in her 69s   Hypertension Mother    Heart attack Mother    Heart disease Father    Hypertension Father    Hypertension Sister    Hypertension Son    Stroke Paternal Grandmother    Stroke Paternal Grandfather     Social History   Tobacco Use   Smoking status: Never   Smokeless tobacco: Never  Vaping Use   Vaping Use: Never used  Substance Use Topics   Alcohol use: No   Drug use: No    Home Medications Prior to Admission medications   Medication Sig Start Date End Date Taking? Authorizing Provider  amoxicillin-clavulanate (AUGMENTIN) 875-125 MG tablet Take 1 tablet by mouth 2 (two) times daily for 5 days. 08/18/20 08/23/20 Yes Regie Bunner, PA-C  acalabrutinib (CALQUENCE) 100 MG capsule TAKE 1 CAPSULE (100 MG TOTAL) BY MOUTH 2 (TWO) TIMES DAILY. 12/19/19 12/18/20  Heath Lark, MD  acetaminophen (TYLENOL) 325 MG tablet Take 650 mg by mouth every 6 (six) hours as needed for mild pain or headache.     [provider]   amLODipine (NORVASC) 5 MG tablet Take 1.5 tablets (7.5 mg total) by mouth daily. 02/09/20 05/09/20  Richardson Dopp T, PA-C  amoxicillin (AMOXIL) 500 MG capsule TAKE FOUR CAPSULES BY MOUTH ONE HOUR BEFORE APPOINTMENT 07/30/20   Sherren Mocha, MD  aspirin 81 MG tablet Take 81 mg by mouth daily.    [provider]  atorvastatin (LIPITOR) 10 MG tablet TAKE 1 TABLET BY MOUTH ONCE DAILY 6 IN THE EVENING 04/02/20   Sherren Mocha, MD  Calcium Carb-Cholecalciferol (CALCIUM 500 + D) 500-125 MG-UNIT TABS Take 1 tablet by mouth daily.    [provider]  metoprolol succinate (TOPROL-XL) 50 MG 24 hr tablet Take 1 tablet  by mouth once daily 08/13/20   Richardson Dopp T, PA-C  Multiple Vitamins-Minerals (HM MULTIVITAMIN ADULT GUMMY PO) Chew two gummies daily    [provider]  oxybutynin (DITROPAN-XL) 5 MG 24 hr tablet Take 5 mg by mouth at bedtime.    [provider]    Allergies    Patient has no known allergies.  Review of Systems   Review of Systems  HENT:  Positive for facial swelling.   Musculoskeletal:  Positive for arthralgias.  Skin:  Positive for wound.  Neurological:  Positive for syncope.  Hematological:  Bruises/bleeds easily (per pt).  All other systems reviewed and are negative.  Physical Exam Updated Vital Signs BP (!) 195/60 (BP Location: Left Arm)   Pulse 61   Temp 98.5 F (36.9 C) (Oral)   Resp 20   Ht 5' (1.524 m)   Wt 42.6 kg   SpO2 98%   BMI 18.36 kg/m   Physical Exam Vitals and nursing note reviewed.  Constitutional:      General: She is not in acute distress.    Appearance: Normal appearance.  HENT:     Head: Normocephalic.     Comments: Large hematoma over the right zygoma.  Laceration of the right upper lip through the vermilion border and extending to the inside of the lip    Right Ear: No hemotympanum.     Left Ear: No hemotympanum.  Eyes:     Conjunctiva/sclera: Conjunctivae normal.     Pupils: Pupils are equal, round, and  reactive to light.  Cardiovascular:     Rate and Rhythm: Normal rate and regular rhythm.     Pulses: Normal pulses.  Pulmonary:     Effort: Pulmonary effort is normal. No respiratory distress.     Breath sounds: Normal breath sounds. No wheezing.     Comments: Speaking in full sentences.  Clear lung sounds in all fields. Abdominal:     General: There is no distension.     Palpations: Abdomen is soft. There is no mass.     Tenderness: There is no abdominal tenderness. There is no guarding or rebound.  Musculoskeletal:        General: Tenderness present.     Cervical back: Normal range of motion and neck supple.     Comments: Contusion of the anterior right shoulder.  Limited range of motion due to pain.  Small laceration over the distal ulna with mild tenderness with movement of the wrist, however no obvious deformity. No tenderness to palpation of back or midline spine.  No step-offs or deformities. No pelvic pain  Skin:    General: Skin is warm and dry.     Capillary Refill: Capillary refill takes less than 2 seconds.  Neurological:     Mental Status: She is alert and oriented to person, place, and time.  Psychiatric:        Mood and Affect: Mood and affect normal.        Speech: Speech normal.        Behavior: Behavior normal.    ED Results / Procedures / Treatments   Labs (all labs ordered are listed, but only abnormal results are displayed) Labs Reviewed  CBC WITH DIFFERENTIAL/PLATELET - Abnormal; Notable for the following components:      Result Value   Platelets 127 (*)    All other components within normal limits  COMPREHENSIVE METABOLIC PANEL - Abnormal; Notable for the following components:   Sodium 134 (*)  Chloride 97 (*)    Glucose, Bld 104 (*)    Creatinine, Ser 1.17 (*)    Calcium 10.6 (*)    GFR, Estimated 47 (*)    All other components within normal limits  URINALYSIS, ROUTINE W REFLEX MICROSCOPIC  TROPONIN I (HIGH SENSITIVITY)  TROPONIN I (HIGH  SENSITIVITY)    EKG EKG Interpretation  Date/Time:  Saturday August 18 2020 18:08:45 EDT Ventricular Rate:  66 PR Interval:  197 QRS Duration: 84 QT Interval:  391 QTC Calculation: 410 R Axis:   10 Text Interpretation: Sinus rhythm Anteroseptal infarct, age indeterminate No significant change since prior 1/21 Confirmed by Aletta Edouard [phone]) on 08/18/2020 6:11:00 PM  Radiology DG Chest 2 View  Result Date: 08/18/2020 CLINICAL DATA:  Fall 2 hours ago with chest pain, initial encounter EXAM: CHEST - 2 VIEW COMPARISON:  03/01/2014 FINDINGS: Cardiac shadow is within normal limits. Postsurgical changes are again seen and stable. The lungs are well aerated without focal infiltrate or effusion. No bony abnormality is noted. IMPRESSION: No acute abnormality noted. Electronically Signed   By: Inez Catalina M.D.   On: 08/18/2020 19:03   DG Shoulder Right  Result Date: 08/18/2020 CLINICAL DATA:  Fall, right shoulder pain EXAM: RIGHT SHOULDER - 2+ VIEW COMPARISON:  None. FINDINGS: There is no evidence of fracture or dislocation. Mild arthropathy of the right AC joint. Glenohumeral joint space appears slightly narrowed. Soft tissues are unremarkable. IMPRESSION: Negative. Electronically Signed   By: Davina Poke D.O.   On: 08/18/2020 19:04   CT HEAD WO CONTRAST (5MM)  Result Date: 08/18/2020 CLINICAL DATA:  Head trauma, minor (Age >= 65y); Facial trauma; Neck trauma (Age >= 65y). Mechanical fall 2 hours ago. Right facial swelling EXAM: CT HEAD WITHOUT CONTRAST CT MAXILLOFACIAL WITHOUT CONTRAST CT CERVICAL SPINE WITHOUT CONTRAST TECHNIQUE: Multidetector CT imaging of the head, cervical spine, and maxillofacial structures were performed using the standard protocol without intravenous contrast. Multiplanar CT image reconstructions of the cervical spine and maxillofacial structures were also generated. COMPARISON:  09/26/2016 FINDINGS: CT HEAD FINDINGS Brain: No evidence of acute infarction,  hemorrhage, hydrocephalus, extra-axial collection or mass lesion/mass effect. Advanced low-density changes within the periventricular and subcortical white matter compatible with chronic microvascular ischemic change. Moderate diffuse cerebral volume loss. Vascular: Atherosclerotic calcifications involving the large vessels of the skull base. No unexpected hyperdense vessel. Skull: Normal. Negative for fracture or focal lesion. Other: Negative for scalp hematoma. CT MAXILLOFACIAL FINDINGS Osseous: No acute maxillofacial bone fracture. Bony orbital walls are intact. Mandible intact. Temporomandibular joints are aligned without dislocation. Advanced degenerative changes of the bilateral TMJs. Orbits: Negative. No traumatic or inflammatory finding. Sinuses: Clear. Soft tissues: Prominent soft tissue swelling within the right facial/maxillary soft tissues. No well-defined hematoma. CT CERVICAL SPINE FINDINGS Alignment: Facet joints are aligned without dislocation or traumatic listhesis. Dens and lateral masses are aligned. Degenerative grade 1 anterolisthesis of C3 on C4. Skull base and vertebrae: No acute fracture. No primary bone lesion or focal pathologic process. Soft tissues and spinal canal: No prevertebral fluid or swelling. No visible canal hematoma. Disc levels: Multilevel degenerative disc disease with uncovertebral spurring. Bulky multilevel facet arthropathy is worse on the left. Upper chest: Included lung apices are clear. Other: None. IMPRESSION: 1. No acute intracranial abnormality. 2. Advanced chronic small vessel ischemic disease and cerebral volume loss. 3. No acute maxillofacial bone fracture. Prominent soft tissue swelling within the right facial/maxillary soft tissues. 4. No acute fracture or traumatic listhesis of the cervical spine. 5. Multilevel  degenerative disc disease and facet arthropathy of the cervical spine. Electronically Signed   By: Davina Poke D.O.   On: 08/18/2020 19:02   CT  Cervical Spine Wo Contrast  Result Date: 08/18/2020 CLINICAL DATA:  Head trauma, minor (Age >= 65y); Facial trauma; Neck trauma (Age >= 65y). Mechanical fall 2 hours ago. Right facial swelling EXAM: CT HEAD WITHOUT CONTRAST CT MAXILLOFACIAL WITHOUT CONTRAST CT CERVICAL SPINE WITHOUT CONTRAST TECHNIQUE: Multidetector CT imaging of the head, cervical spine, and maxillofacial structures were performed using the standard protocol without intravenous contrast. Multiplanar CT image reconstructions of the cervical spine and maxillofacial structures were also generated. COMPARISON:  09/26/2016 FINDINGS: CT HEAD FINDINGS Brain: No evidence of acute infarction, hemorrhage, hydrocephalus, extra-axial collection or mass lesion/mass effect. Advanced low-density changes within the periventricular and subcortical white matter compatible with chronic microvascular ischemic change. Moderate diffuse cerebral volume loss. Vascular: Atherosclerotic calcifications involving the large vessels of the skull base. No unexpected hyperdense vessel. Skull: Normal. Negative for fracture or focal lesion. Other: Negative for scalp hematoma. CT MAXILLOFACIAL FINDINGS Osseous: No acute maxillofacial bone fracture. Bony orbital walls are intact. Mandible intact. Temporomandibular joints are aligned without dislocation. Advanced degenerative changes of the bilateral TMJs. Orbits: Negative. No traumatic or inflammatory finding. Sinuses: Clear. Soft tissues: Prominent soft tissue swelling within the right facial/maxillary soft tissues. No well-defined hematoma. CT CERVICAL SPINE FINDINGS Alignment: Facet joints are aligned without dislocation or traumatic listhesis. Dens and lateral masses are aligned. Degenerative grade 1 anterolisthesis of C3 on C4. Skull base and vertebrae: No acute fracture. No primary bone lesion or focal pathologic process. Soft tissues and spinal canal: No prevertebral fluid or swelling. No visible canal hematoma. Disc levels:  Multilevel degenerative disc disease with uncovertebral spurring. Bulky multilevel facet arthropathy is worse on the left. Upper chest: Included lung apices are clear. Other: None. IMPRESSION: 1. No acute intracranial abnormality. 2. Advanced chronic small vessel ischemic disease and cerebral volume loss. 3. No acute maxillofacial bone fracture. Prominent soft tissue swelling within the right facial/maxillary soft tissues. 4. No acute fracture or traumatic listhesis of the cervical spine. 5. Multilevel degenerative disc disease and facet arthropathy of the cervical spine. Electronically Signed   By: Davina Poke D.O.   On: 08/18/2020 19:02   CT Maxillofacial Wo Contrast  Result Date: 08/18/2020 CLINICAL DATA:  Head trauma, minor (Age >= 65y); Facial trauma; Neck trauma (Age >= 65y). Mechanical fall 2 hours ago. Right facial swelling EXAM: CT HEAD WITHOUT CONTRAST CT MAXILLOFACIAL WITHOUT CONTRAST CT CERVICAL SPINE WITHOUT CONTRAST TECHNIQUE: Multidetector CT imaging of the head, cervical spine, and maxillofacial structures were performed using the standard protocol without intravenous contrast. Multiplanar CT image reconstructions of the cervical spine and maxillofacial structures were also generated. COMPARISON:  09/26/2016 FINDINGS: CT HEAD FINDINGS Brain: No evidence of acute infarction, hemorrhage, hydrocephalus, extra-axial collection or mass lesion/mass effect. Advanced low-density changes within the periventricular and subcortical white matter compatible with chronic microvascular ischemic change. Moderate diffuse cerebral volume loss. Vascular: Atherosclerotic calcifications involving the large vessels of the skull base. No unexpected hyperdense vessel. Skull: Normal. Negative for fracture or focal lesion. Other: Negative for scalp hematoma. CT MAXILLOFACIAL FINDINGS Osseous: No acute maxillofacial bone fracture. Bony orbital walls are intact. Mandible intact. Temporomandibular joints are aligned  without dislocation. Advanced degenerative changes of the bilateral TMJs. Orbits: Negative. No traumatic or inflammatory finding. Sinuses: Clear. Soft tissues: Prominent soft tissue swelling within the right facial/maxillary soft tissues. No well-defined hematoma. CT CERVICAL SPINE FINDINGS Alignment: Facet  joints are aligned without dislocation or traumatic listhesis. Dens and lateral masses are aligned. Degenerative grade 1 anterolisthesis of C3 on C4. Skull base and vertebrae: No acute fracture. No primary bone lesion or focal pathologic process. Soft tissues and spinal canal: No prevertebral fluid or swelling. No visible canal hematoma. Disc levels: Multilevel degenerative disc disease with uncovertebral spurring. Bulky multilevel facet arthropathy is worse on the left. Upper chest: Included lung apices are clear. Other: None. IMPRESSION: 1. No acute intracranial abnormality. 2. Advanced chronic small vessel ischemic disease and cerebral volume loss. 3. No acute maxillofacial bone fracture. Prominent soft tissue swelling within the right facial/maxillary soft tissues. 4. No acute fracture or traumatic listhesis of the cervical spine. 5. Multilevel degenerative disc disease and facet arthropathy of the cervical spine. Electronically Signed   By: Davina Poke D.O.   On: 08/18/2020 19:02    Procedures .Marland KitchenLaceration Repair  Date/Time: 08/18/2020 7:23 PM Performed by: Franchot Heidelberg, PA-C Authorized by: Franchot Heidelberg, PA-C   Consent:    Consent obtained:  Verbal   Consent given by:  Patient   Risks discussed:  Infection, pain, poor cosmetic result, poor wound healing, need for additional repair and nerve damage Anesthesia:    Anesthesia method:  Local infiltration   Local anesthetic:  Lidocaine 2% WITH epi Laceration details:    Location:  Lip   Lip location:  Upper lip, full thickness   Vermilion border involved: yes     Height of lip laceration:  Up to half vertical height   Length  (cm):  1.5   Depth (mm):  5 Pre-procedure details:    Preparation:  Patient was prepped and draped in usual sterile fashion and imaging obtained to evaluate for foreign bodies Exploration:    Wound exploration: wound explored through full range of motion and entire depth of wound visualized   Skin repair:    Repair method:  Sutures   Wound skin closure material used: 6-0 prolene and 5-0 vicryl rapide.   Suture technique:  Simple interrupted   Number of sutures: 4 prolene, 1 vicryl rapide. Approximation:    Approximation:  Close   Vermilion border well-aligned: yes   Repair type:    Repair type:  Intermediate Post-procedure details:    Dressing:  Open (no dressing)   Procedure completion:  Tolerated well, no immediate complications   Medications Ordered in ED Medications  lidocaine-EPINEPHrine (XYLOCAINE W/EPI) 2 %-1:200000 (PF) injection 20 mL (20 mLs Infiltration Given 08/18/20 1810)    ED Course  I have reviewed the triage vital signs and the nursing notes.  Pertinent labs & imaging results that were available during my care of the patient were reviewed by me and considered in my medical decision making (see chart for details).  Clinical Course as of 08/18/20 1926  Sat Aug 18, 9380  1923 80 year old female here after fall striking her face.  Significant soft tissue swelling to right cheek and upper lip along with laceration.  Getting lab work head face cervical spine CT.  Likely discharge if no significant findings. [MB]    Clinical Course User Index [MB] Hayden Rasmussen, MD   MDM Rules/Calculators/A&P                           Patient presenting for evaluation after a fall/syncopal event.  On exam, patient has a large hematoma to the face.  She has a through and through lip plaque that will require repair.  Signs of injury to the right shoulder and wrist.  As patient reports she is on a blood thinner, CT head, neck, maxillofacial ordered.  Will obtain x-rays of the  shoulder, wrist, chest.  Due to syncope, obtain labs, urine, EKG.  Labs interpreted by me, overall reassuring.  EKG unchanged from previous, nonischemic.  CT head, neck, maxillofacial negative for acute findings other than soft tissue swelling.  X-rays of the chest and shoulder viewed and independently interpreted by me, negative for fracture or dislocation.   Laceration repaired as described above.  Discussed aftercare instructions.  Will place on antibiotics as patient is immunocompromise.  Tetanus is up-to-date.  At this time, patient appears safe for discharge.  Return precautions given.  Patient states she understands and agrees to plan.  Final Clinical Impression(s) / ED Diagnoses Final diagnoses:  Syncope, unspecified syncope type  Injury of head, initial encounter  Lip laceration, initial encounter    Rx / DC Orders ED Discharge Orders          Ordered    amoxicillin-clavulanate (AUGMENTIN) 875-125 MG tablet  2 times daily        08/18/20 1918             Franchot Heidelberg, PA-C 08/18/20 1926    Hayden Rasmussen, MD 08/19/20 1035

## 2020-08-20 ENCOUNTER — Telehealth: Payer: Self-pay | Admitting: Cardiovascular Disease

## 2020-08-20 ENCOUNTER — Encounter: Payer: Self-pay | Admitting: *Deleted

## 2020-08-20 ENCOUNTER — Ambulatory Visit (INDEPENDENT_AMBULATORY_CARE_PROVIDER_SITE_OTHER): Payer: Medicare Other

## 2020-08-20 ENCOUNTER — Ambulatory Visit (HOSPITAL_COMMUNITY): Payer: Medicare Other | Attending: Internal Medicine

## 2020-08-20 ENCOUNTER — Other Ambulatory Visit: Payer: Self-pay

## 2020-08-20 DIAGNOSIS — R55 Syncope and collapse: Secondary | ICD-10-CM | POA: Diagnosis present

## 2020-08-20 LAB — ECHOCARDIOGRAM COMPLETE
Area-P 1/2: 3.5 cm2
S' Lateral: 1.85 cm

## 2020-08-20 NOTE — Telephone Encounter (Signed)
Returned call to Pt.  Pt calling to advise she had a syncopal event with injury and is concerned that her valve is not working.  Advised would discuss with Dr. Burt Knack and call back with further advisement.

## 2020-08-20 NOTE — Telephone Encounter (Signed)
Patient is following up--she states she still feels weak and she is very concerned. She would like to know if she can be worked in any sooner with Dr. Burt Knack or Richardson Dopp, [place]. Please advise.

## 2020-08-20 NOTE — Telephone Encounter (Signed)
Returned call to Pt.  Advised she had an echo scheduled for TODAY.  Would get results of echo and would be able to determine if there was an issue with her valve at that time.  Encouraged Pt to come to echo to evaluate valve.  She will call her ride.

## 2020-08-20 NOTE — Progress Notes (Unsigned)
30 day [employer] cardiac event monitor [id number] from office inventory applied to patient.

## 2020-08-20 NOTE — Telephone Encounter (Signed)
Per Dr. Romie Levee will need Echo and 30 day live monitor d/t syncopal episode.  Returned call to Pt.  She is aware of recommendations.  Will need f/u after echo and monitor complete.

## 2020-08-20 NOTE — Telephone Encounter (Signed)
Yes I am happy to see her in that spot.  I reviewed the emergency room records and evaluation.  As outlined I would recommend a 30-day monitor and an echocardiogram for further assessment of syncope.  Thank you

## 2020-08-20 NOTE — Telephone Encounter (Signed)
New message:   Patient calling stating that she was at [facility] and just fell down. She was not dizzy or did not feel faint.

## 2020-08-20 NOTE — Telephone Encounter (Signed)
Ok. Thanks for letting me know. Probably best to reach out to PCP as it sounds like a mechanical fall rather than a heart-related issue.

## 2020-08-25 ENCOUNTER — Encounter (HOSPITAL_BASED_OUTPATIENT_CLINIC_OR_DEPARTMENT_OTHER): Payer: Self-pay | Admitting: Emergency Medicine

## 2020-08-25 ENCOUNTER — Other Ambulatory Visit: Payer: Self-pay

## 2020-08-25 ENCOUNTER — Emergency Department (HOSPITAL_BASED_OUTPATIENT_CLINIC_OR_DEPARTMENT_OTHER)
Admission: EM | Admit: 2020-08-25 | Discharge: 2020-08-25 | Disposition: A | Discharge status: Home / Self Care | Payer: Medicare Other | Attending: Emergency Medicine | Admitting: Emergency Medicine

## 2020-08-25 DIAGNOSIS — N183 Chronic kidney disease, stage 3 unspecified: Secondary | ICD-10-CM | POA: Diagnosis not present

## 2020-08-25 DIAGNOSIS — I129 Hypertensive chronic kidney disease with stage 1 through stage 4 chronic kidney disease, or unspecified chronic kidney disease: Secondary | ICD-10-CM | POA: Diagnosis not present

## 2020-08-25 DIAGNOSIS — I2510 Atherosclerotic heart disease of native coronary artery without angina pectoris: Secondary | ICD-10-CM | POA: Diagnosis not present

## 2020-08-25 DIAGNOSIS — Z951 Presence of aortocoronary bypass graft: Secondary | ICD-10-CM | POA: Diagnosis not present

## 2020-08-25 DIAGNOSIS — Z85828 Personal history of other malignant neoplasm of skin: Secondary | ICD-10-CM | POA: Insufficient documentation

## 2020-08-25 DIAGNOSIS — Z79899 Other long term (current) drug therapy: Secondary | ICD-10-CM | POA: Insufficient documentation

## 2020-08-25 DIAGNOSIS — Z7982 Long term (current) use of aspirin: Secondary | ICD-10-CM | POA: Insufficient documentation

## 2020-08-25 DIAGNOSIS — Z4802 Encounter for removal of sutures: Secondary | ICD-10-CM | POA: Insufficient documentation

## 2020-08-25 NOTE — ED Triage Notes (Signed)
Patient to ED for suture removal to lip.

## 2020-08-25 NOTE — ED Provider Notes (Signed)
[facility] Provider Note   CSN: [id number] Arrival date & time: 08/25/20  1025     History Chief Complaint  Patient presents with   Suture / Staple Removal    Nicole Mercado is a 80 y.o. female with past medical history as listed below who presents to emergency department today with chief complaint of suture removal.  Patient was seen in the ED x7 days ago after a syncopal episode sustaining a laceration to her right upper lip.  Wound was repaired and patient states she has been doing well at home.  No new syncope or falls.  She denies any fever or chills at home.  She denies any pain.  No over-the-counter medications prior to arrival.      Past Medical History:  Diagnosis Date   Aortic stenosis    a. Echocardiogram (01/11/14):  Mild LVH, EF 60-65%, Gr 1 DD, possible bicuspid AV, severe AS (mean 61 mmHg, peak 104 mmHg), mild MVP of post leaflet, mild MR, PASP 33 mmHg.;  b. 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 LHC (01/13/14):  dLM 70 extending into oLAD, mLAD 40-50, pD1 80, pD2 70, ostial/prox OM1 70 >> CABG (L-LAD, S-OM1, S-D1, S-D2)     Carotid artery occlusion    a. s/p L CEA 2013;  b.  Carotid US (1/16):  Bilateral ICA 1-39%   CLL (chronic lymphocytic leukemia) (HCC)    slow leukemia---Dr  Murinson   H/O exercise stress test    NSSTT   Hx of cardiovascular stress test    a. Nuclear stress test That (4/13): Normal perfusion, EF 74%   Hx of echocardiogram    Echo (2/16):  Mild LVH, EF 60-65%, no RWMA, Gr 2 DD, AVR ok (mean 9 mmHg), trivial AI, mild MR, mild LAE, PASP 40 mmHg   Hypertension    Pancreatitis    h/o   Thrombocytopenia ([place]) 11/19/2010    Patient Active Problem List   Diagnosis Date Noted   CKD (chronic kidney disease), stage III ([place]) 03/25/2019   Basal cell carcinoma (BCC) of [place]a [place] 02/11/2019   PAC (premature atrial  contraction) 01/14/2019   Goals of care, counseling/discussion 12/08/2018   Skin lesion 12/18/2016   Hypercalcemia 06/19/2016   Drug-induced neutropenia ([place]) 05/27/2016   Hematuria, gross 08/28/2015   Easy bruising 05/31/2015   Encounter for chemotherapy management 11/03/2014   Essential hypertension 10/30/2014   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   S/P AVR 01/17/2014   CAD (coronary artery disease), native coronary artery 01/16/2014   Syncope 01/11/2014   Aortic stenosis 01/11/2014   Arrhythmia 01/11/2014   Syncope and collapse 01/11/2014   Faintness    Aftercare following surgery of the circulatory system, NEC 08/18/2013   Occlusion and stenosis of carotid artery without mention of cerebral infarction 06/03/2011   CLL (chronic lymphocytic leukemia) ([place]) 03/21/2011   Cardiac murmur 03/21/2011   Carotid artery stenosis 03/21/2011   Thrombocytopenia ([place]) 11/19/2010   Lymphadenopathy 11/11/2010    Past Surgical History:  Procedure Laterality Date   ABDOMINAL HYSTERECTOMY     AORTIC VALVE REPLACEMENT [name] 01/17/2014   Procedure: AORTIC VALVE REPLACEMENT (AVR);  Surgeon: Gaye Pollack, MD;  Location: [facility];  Service: Open Heart Surgery;  Laterality: [name];   APPENDECTOMY     CAROTID ENDARTERECTOMY  06/09/11   LEFT  cea  CESAREAN SECTION     FIVE   CHOLECYSTECTOMY     CORONARY ARTERY BYPASS GRAFT [name] 01/17/2014   Procedure: CORONARY ARTERY BYPASS GRAFTING (CABG)TIMES FOUR USING LEFT INTERNAL MAMMARY ARTERY AND RIGHT SAPHENOUS VEIN HARVESTED ENDOSCOPICALLY;  Surgeon: Gaye Pollack, MD;  Location: [facility];  Service: Open Heart Surgery;  Laterality: [name];   ENDARTERECTOMY  06/09/2011   Procedure: ENDARTERECTOMY CAROTID;  Surgeon: Rosetta Posner, MD;  Location: [facility];  Service: Vascular;  Laterality: Left;  left carotid endarterectomy with patch angioplasty   LEFT AND RIGHT HEART CATHETERIZATION WITH CORONARY ANGIOGRAM [name] 01/13/2014   Procedure: LEFT AND RIGHT HEART CATHETERIZATION WITH  CORONARY ANGIOGRAM;  Surgeon: Jettie Booze, MD;  Location: [facility] CATH LAB;  Service: Cardiovascular;  Laterality: [name];   TEE WITHOUT CARDIOVERSION [name] 01/17/2014   Procedure: TRANSESOPHAGEAL ECHOCARDIOGRAM (TEE);  Surgeon: Gaye Pollack, MD;  Location: [facility];  Service: Open Heart Surgery;  Laterality: [name];   TONSILLECTOMY       OB History   No obstetric history on file.     Family History  Problem Relation Age of Onset   Heart disease Mother        in her 25s   Hypertension Mother    Heart attack Mother    Heart disease Father    Hypertension Father    Hypertension Sister    Hypertension Son    Stroke Paternal Grandmother    Stroke Paternal Grandfather     Social History   Tobacco Use   Smoking status: Never   Smokeless tobacco: Never  Vaping Use   Vaping Use: Never used  Substance Use Topics   Alcohol use: No   Drug use: No    Home Medications Prior to Admission medications   Medication Sig Start Date End Date Taking? Authorizing Provider  acalabrutinib (CALQUENCE) 100 MG capsule TAKE 1 CAPSULE (100 MG TOTAL) BY MOUTH 2 (TWO) TIMES DAILY. 12/19/19 12/18/20  Heath Lark, MD  acetaminophen (TYLENOL) 325 MG tablet Take 650 mg by mouth every 6 (six) hours as needed for mild pain or headache.     [provider]  amLODipine (NORVASC) 5 MG tablet Take 1.5 tablets (7.5 mg total) by mouth daily. 02/09/20 05/09/20  Richardson Dopp T, PA-C  amoxicillin (AMOXIL) 500 MG capsule TAKE FOUR CAPSULES BY MOUTH ONE HOUR BEFORE APPOINTMENT 07/30/20   Sherren Mocha, MD  aspirin 81 MG tablet Take 81 mg by mouth daily.    [provider]  atorvastatin (LIPITOR) 10 MG tablet TAKE 1 TABLET BY MOUTH ONCE DAILY 6 IN THE EVENING 04/02/20   Sherren Mocha, MD  Calcium Carb-Cholecalciferol (CALCIUM 500 + D) 500-125 MG-UNIT TABS Take 1 tablet by mouth daily.    [provider]  metoprolol succinate (TOPROL-XL) 50 MG 24 hr tablet Take 1 tablet by mouth once daily 08/13/20    Richardson Dopp T, PA-C  Multiple Vitamins-Minerals (HM MULTIVITAMIN ADULT GUMMY PO) Chew two gummies daily    [provider]  oxybutynin (DITROPAN-XL) 5 MG 24 hr tablet Take 5 mg by mouth at bedtime.    [provider]    Allergies    Patient has no known allergies.  Review of Systems   Review of Systems All other systems are reviewed and are negative for acute change except as noted in the HPI.  Physical Exam Updated Vital Signs BP (!) 134/107   Pulse (!) 50   Temp 97.9 F (36.6 C) (Oral)   Resp 20  Ht 5' (1.524 m)   Wt 42.5 kg   SpO2 94%   BMI 18.28 kg/m   Physical Exam Vitals and nursing note reviewed.  Constitutional:      Appearance: She is well-developed. She is not ill-appearing or toxic-appearing.  HENT:     Head: Normocephalic and atraumatic.     Nose: Nose normal.     Mouth/Throat:     Comments: Scab on exterior right upper lip with suture strings visible.  No surrounding erythema.  No tenderness to palpation. Eyes:     General: No scleral icterus.       Right eye: No discharge.        Left eye: No discharge.     Conjunctiva/sclera: Conjunctivae normal.  Neck:     Vascular: No JVD.  Cardiovascular:     Rate and Rhythm: Normal rate and regular rhythm.     Pulses: Normal pulses.     Heart sounds: Normal heart sounds.  Pulmonary:     Effort: Pulmonary effort is normal.     Breath sounds: Normal breath sounds.  Abdominal:     General: There is no distension.  Musculoskeletal:        General: Normal range of motion.     Cervical back: Normal range of motion.  Skin:    General: Skin is warm and dry.  Neurological:     Mental Status: She is oriented to person, place, and time.     GCS: GCS eye subscore is 4. GCS verbal subscore is 5. GCS motor subscore is 6.     Comments: Fluent speech, no facial droop.  Psychiatric:        Behavior: Behavior normal.    ED Results / Procedures / Treatments   Labs (all labs ordered are listed, but  only abnormal results are displayed) Labs Reviewed - No data to display  EKG None  Radiology No results found.  Procedures .Suture Removal  Date/Time: 08/25/2020 10:54 AM Performed by: Barrie Folk, PA-C Authorized by: Barrie Folk, PA-C   Consent:    Consent obtained:  Verbal   Consent given by:  Patient   Risks discussed:  Bleeding, wound separation and pain Universal protocol:    Patient identity confirmed:  Verbally with patient Location:    Location:  Mouth   Mouth location:  Upper exterior lip Procedure details:    Wound appearance:  No signs of infection, good wound healing, nontender, nonpurulent and purulent   Number of sutures removed:  4 Post-procedure details:    Post-removal:  No dressing applied   Procedure completion:  Tolerated well, no immediate complications   Medications Ordered in ED Medications - No data to display  ED Course  I have reviewed the triage vital signs and the nursing notes.  Pertinent labs & imaging results that were available during my care of the patient were reviewed by me and considered in my medical decision making (see chart for details).    MDM Rules/Calculators/A&P                           History provided by patient with additional history obtained from chart review.    Pt to ER for suture removal and wound check as above.  Procedure tolerated well. BP elevated in triage likely related to patient's history of white coat syndrome. She is asymptomatic, admits to taking BP medications this morning. No signs of infection. Scar minimization & return precautions  given at dc.    Portions of this note were generated with [employer]. Dictation errors may occur despite best attempts at proofreading.  Final Clinical Impression(s) / ED Diagnoses Final diagnoses:  Visit for suture removal    Rx / DC Orders ED Discharge Orders     None        Barrie Folk, PA-C 08/25/20  [place], DO 08/25/20 1317

## 2020-08-25 NOTE — Discharge Instructions (Addendum)
Your wound appears to be healing well. Continue to keep the area clean and dry.   May apply ointments or lotions such as Aquaphor to the area to reduce scarring. The key is to keep the skin well hydrated and supple. It is also important to stay well hydrated by drinking plenty of water. Keep your scar protected from the sun. Cover the scar with sunscreen that has an SPF (sun protection factor) of 30 or higher. Do not put sunscreen on your scar until it has healed. Gently massage the scar using a circular motion. This will help to minimize the appearance of the scar. Do this only after the incision has closed and all of the sutures have been removed. Remember that the scar may appear lighter or darker than your normal skin color. This difference in color should even out with time. If your scar does not fade or go away with time and you do not like how it looks, consider talking with a Psychiatric nurse or a dermatologist.

## 2020-09-03 ENCOUNTER — Other Ambulatory Visit (HOSPITAL_COMMUNITY): Payer: Self-pay

## 2020-09-03 MED FILL — Acalabrutinib Cap 100 MG: ORAL | 30 days supply | Qty: 60 | Fill #5 | Status: AC

## 2020-09-06 ENCOUNTER — Other Ambulatory Visit (HOSPITAL_COMMUNITY): Payer: Self-pay

## 2020-10-01 ENCOUNTER — Other Ambulatory Visit (HOSPITAL_COMMUNITY): Payer: Self-pay

## 2020-10-04 NOTE — Progress Notes (Signed)
Cardiology Office Note:    Date:  10/05/2020   ID:  [name], DOB [birth year], MRN [id number]  PCP:  Kristie Cowman, MD   [facility] Providers Cardiologist:  Sherren Mocha, MD Cardiology APP:  [facility]     Referring MD: Kristie Cowman, MD   Chief Complaint:  Hospitalization Follow-up (ED visit for syncope)    Patient Profile:   KENDA KLOEHN is a 80 y.o. female with:  Coronary artery disease + Aortic stenosis S/p CABG+AVR in 1/16 Post op AFib  Hypertension  Carotid artery disease  s/p L CEA in 6/13 Hyperlipidemia  Aortic atherosclerosis (CT 11/2018) Chronic Lymphocytic Leukemia  (Dr. Alvy Bimler) Chronic thrombocytopenia    Prior CV Studies: Echocardiogram 08/20/20 EF >75, hyperdynamic LV function, no RWMA, normal RVSF, mild LAE, mild MR, mild-moderate TR, AVR functioning normally with mean gradient 8 mmHg and mild AI, RVSP 40.5  CARDIAC TELEMETRY MONITORING- OFFICE PLACED 08/20/2020 Narrative 1. The basic rhythm is normal sinus with an average HR of 71 bpm 2. Possible atrial fibrillation with a very low burden over a total of 3 hours during the entire monitoring period, baseline artifact makes interpretation difficult 3. No high-grade heart block or pathologic pauses 4. There are occasional PVC's and occasional supraventricular beats 5. No sustained ventricular arrhythmia  Echocardiogram 12/16/2018 EF 60-65, GRII DD, mildly reduced RVSF, mild LAE, s/p bioprosthetic AVR functioning normally with mean gradient 6 and mild AI, trivial MR, trivial TR, RVSP 38.3   Carotid US 10/09/2017 Bilateral ICA 1-39; patent left CEA   Event Monitor 08/23/15  Rare PACs  No significant bradycardia, no pauses, no atrial fibrillation  No adverse arrhythmias identified   LHC (01/13/14):   dLM 70 extending into oLAD, mLAD 40-50, pD1 80, pD2 70, ostial/prox OM1 70   Echocardiogram (01/11/14):   Mild LVH, EF 60-65%, Gr 1 DD, possible bicuspid AV, severe AS (mean 61 mmHg,  peak 104 mmHg), mild MVP of post leaflet, mild MR, PASP 33 mmHg.    History of Present Illness: Ms. Marando was last seen in 1/22 via telemedicine.  She recently presented to the emergency room on 08/18/2020 with a syncopal episode.  This occurred while in a grocery store.  He had a head CT that demonstrated no acute findings.  EKG was personally reviewed and demonstrated sinus rhythm and no change when compared to prior tracings.  QTc was normal.  Labs were notable for low sodium of 134, potassium 3.6, creatinine stable at 1.17.  Hemoglobin was 14.2.  She required stitches to her lip.  She has had follow-up echocardiogram which demonstrates EF >75 and normally functioning AVR.  Given hyperdynamic LV function, adequate hydration was recommended.  Event monitor returned yesterday with mainly sinus rhythm and possible atrial fibrillation (low burden; 1%).  She returns for follow-up.  She is here today with her friend.  She tells me that she had no warning when she passed out.  She had walked into the grocery store and then woke up on the floor.  She has not had any further episodes.  She has not had any near syncope.  She has not had any chest pain, shortness of breath, orthopnea, leg edema.    Past Medical History:  Diagnosis Date   Aortic stenosis    a. Echocardiogram (01/11/14):  Mild LVH, EF 60-65%, Gr 1 DD, possible bicuspid AV, severe AS (mean 61 mmHg, peak 104 mmHg), mild MVP of post leaflet, mild MR, PASP 33 mmHg.;  b. s/p bioprosthetic  AVR 01/2014   Arthritis    Atrial fibrillation (HCC)    post op after CABG+AVR >> Amiodarone   CAD (coronary artery disease)    a. LHC (01/13/14):  dLM 70 extending into oLAD, mLAD 40-50, pD1 80, pD2 70, ostial/prox OM1 70 >> CABG (L-LAD, S-OM1, S-D1, S-D2)     Carotid artery occlusion    a. s/p L CEA 2013;  b.  Carotid US (1/16):  Bilateral ICA 1-39%   CLL (chronic lymphocytic leukemia) (HCC)    slow leukemia---Dr  Murinson   H/O exercise stress test    NSSTT   Hx  of cardiovascular stress test    a. Nuclear stress test That (4/13): Normal perfusion, EF 74%   Hx of echocardiogram    Echo (2/16):  Mild LVH, EF 60-65%, no RWMA, Gr 2 DD, AVR ok (mean 9 mmHg), trivial AI, mild MR, mild LAE, PASP 40 mmHg   Hypertension    Pancreatitis    h/o   Thrombocytopenia (HCC) 11/19/2010   Current Medications: Current Meds  Medication Sig   acalabrutinib (CALQUENCE) 100 MG capsule TAKE 1 CAPSULE (100 MG TOTAL) BY MOUTH 2 (TWO) TIMES DAILY.   acetaminophen (TYLENOL) 325 MG tablet Take 650 mg by mouth every 6 (six) hours as needed for mild pain or headache.    amLODipine (NORVASC) 5 MG tablet Take 1.5 tablets (7.5 mg total) by mouth daily.   amoxicillin (AMOXIL) 500 MG capsule TAKE FOUR CAPSULES BY MOUTH ONE HOUR BEFORE APPOINTMENT   aspirin 81 MG tablet Take 81 mg by mouth daily.   atorvastatin (LIPITOR) 10 MG tablet TAKE 1 TABLET BY MOUTH ONCE DAILY 6 IN THE EVENING   Calcium Carb-Cholecalciferol 500-125 MG-UNIT TABS Take 1 tablet by mouth daily.   metoprolol succinate (TOPROL-XL) 50 MG 24 hr tablet Take 1 tablet by mouth once daily   Multiple Vitamins-Minerals (HM MULTIVITAMIN ADULT GUMMY PO) Chew two gummies daily   oxybutynin (DITROPAN-XL) 5 MG 24 hr tablet Take 5 mg by mouth at bedtime.    Allergies:   Patient has no known allergies.   Social History   Tobacco Use   Smoking status: Never   Smokeless tobacco: Never  Vaping Use   Vaping Use: Never used  Substance Use Topics   Alcohol use: No   Drug use: No    Family Hx: The patient's family history includes Heart attack in her mother; Heart disease in her father and mother; Hypertension in her father, mother, sister, and son; Stroke in her paternal grandfather and paternal grandmother.  Review of Systems  Cardiovascular:  Positive for claudication (Bilateral calf).  Gastrointestinal:  Positive for diarrhea.    EKGs/Labs/Other Test Reviewed:    EKG:  EKG is   ordered today.  The ekg ordered today  demonstrates NSR, HR 65, left axis deviation, septal Q waves, QTC 409, PACs  Recent Labs: 08/18/2020: ALT 18; BUN 21; Creatinine, Ser 1.17; Hemoglobin 14.2; Platelets 127; Potassium 3.6; Sodium 134   Recent Lipid Panel Lab Results  Component Value Date/Time   CHOL 154 01/08/2018 08:21 AM   TRIG 125 01/08/2018 08:21 AM   HDL 70 01/08/2018 08:21 AM   LDLCALC 59 01/08/2018 08:21 AM     Risk Assessment/Calculations:    CHA2DS2-VASc Score = 5   This indicates a 7.2% annual risk of stroke. The patient's score is based upon: CHF History: 0 HTN History: 1 Diabetes History: 0 Stroke History: 0 Vascular Disease History: 1 Age Score: 2 Gender Score: 1  Physical Exam:    VS:  BP (!) 170/72   Pulse 76   Ht 5' (1.524 m)   Wt 94 lb 12.8 oz (43 kg)   SpO2 96%   BMI 18.51 kg/m     Wt Readings from Last 3 Encounters:  10/05/20 94 lb 12.8 oz (43 kg)  08/25/20 93 lb 9.6 oz (42.5 kg)  08/18/20 94 lb (42.6 kg)    Constitutional:      Appearance: Healthy appearance. Not in distress.  Neck:     Vascular: JVD normal.  Pulmonary:     Effort: Pulmonary effort is normal.     Breath sounds: No wheezing. No rales.  Cardiovascular:     Normal rate. Regular rhythm. Normal S1. Normal S2.      Murmurs: There is a grade 2/4 diastolic murmur at the LLSB.  Edema:    Peripheral edema absent.  Abdominal:     Palpations: Abdomen is soft. There is no hepatomegaly.  Skin:    General: Skin is warm and dry.  Neurological:     Mental Status: Alert and oriented to person, place and time.     Cranial Nerves: Cranial nerves are intact.     ASSESSMENT & PLAN:   1. Syncope and collapse Etiology of her syncope is not entirely clear.  It is concerning that she had no warning or prodrome.  She does note a history of limiting fluids to prevent nocturia.  She also has frequent bowel movements with some of her medications.  However, I would have expected her to have some dizziness or  lightheadedness prior to her syncopal episode.  I have advised her that she should not drive for 6 months after her syncopal episode.  She did have a recent echocardiogram with normal LV function.  Her monitor did not demonstrate any pathologic pauses or heart block.  See #2.  I discussed her case today with Dr. Burt Knack.  With her history of unexplained syncope and question of atrial fibrillation on her monitor, we think it may be reasonable to have a loop recorder implanted.  I will refer her to EP.  2. Paroxysmal atrial fibrillation (HCC) There was possible atrial fibrillation on her monitor.  Artifact does limit interpretation somewhat. CHA2DS2-VASc Score = 5 [CHF History: 0, HTN History: 1, Diabetes History: 0, Stroke History: 0, Vascular Disease History: 1, Age Score: 2, Gender Score: 1].  Therefore, the patient's annual risk of stroke is 7.2 %.  Therefore, she would benefit from long-term anticoagulation.  However, she has a history of chronic lymphocytic leukemia as well as thrombocytopenia.  Her platelet count has been as low as 70,000 in the past.  Given her somewhat elevated risk for bleeding and unclear documentation of atrial fibrillation, we have decided to defer anticoagulation for now.  As noted above, I will refer her to EP for consideration of a loop recorder.  3. Coronary artery disease involving native coronary artery of native heart without angina pectoris Status post CABG in 2016.  She has not had chest pain.  However, I have recommended proceeding with an exercise Myoview to completely rule out the possibility of ischemia.  Continue aspirin 81 mg daily, atorvastatin 10 mg daily.  4. Aortic valve stenosis  5. S/P AVR Normally functioning AVR on recent echocardiogram.  Continue SBE prophylaxis.  6. CLL (chronic lymphocytic leukemia) ([place]) Continue follow-up with oncology.  7. Essential hypertension Blood pressures at home are optimal.  Her BP is typically elevated in our clinic.  Continue amlodipine 7.5 mg daily, metoprolol succinate 50 mg daily.  8. Mixed hyperlipidemia Continue atorvastatin 10 mg daily.  9. Bilateral carotid artery disease, unspecified type ([place]) She is past due for carotid ultrasound.  I will arrange carotid Dopplers.  10. Claudication ([place]) She does have symptoms of claudication in bilateral calves.  I will arrange ABIs and lower extremity arterial Dopplers.     Shared Decision Making/Informed Consent The risks [chest pain, shortness of breath, cardiac arrhythmias, dizziness, blood pressure fluctuations, myocardial infarction, stroke/transient ischemic attack, nausea, vomiting, allergic reaction, radiation exposure, metallic taste sensation and life-threatening complications (estimated to be 1 in 10,000)], benefits (risk stratification, diagnosing coronary artery disease, treatment guidance) and alternatives of a nuclear stress test were discussed in detail with Ms. Shakir and she agrees to proceed.   Dispo:  Return in about 6 months (around 04/04/2021) for Routine Follow Up, w/ Dr. Burt Knack.   Medication Adjustments/Labs and Tests Ordered: Current medicines are reviewed at length with the patient today.  Concerns regarding medicines are outlined above.  Tests Ordered: Orders Placed This Encounter  Procedures   Ambulatory referral to Cardiac Electrophysiology   Myocardial Perfusion Imaging   EKG 12-Lead   VAS US CAROTID   VAS [place] LOWER EXTREMITY ARTERIAL DUPLEX   VAS [place] ABI WITH/WO TBI   Medication Changes: No orders of the defined types were placed in this encounter.  Signed, Richardson Dopp, PA-C  10/05/2020 1:02 PM    [facility] [place], [place], [place]  [address] Phone: [phone]; Fax: [phone]

## 2020-10-05 ENCOUNTER — Other Ambulatory Visit: Payer: Self-pay

## 2020-10-05 ENCOUNTER — Ambulatory Visit: Payer: Medicare Other | Admitting: Physician Assistant

## 2020-10-05 ENCOUNTER — Encounter: Payer: Self-pay | Admitting: Physician Assistant

## 2020-10-05 VITALS — BP 170/72 | HR 76 | Ht 60.0 in | Wt 94.8 lb

## 2020-10-05 DIAGNOSIS — I480 Paroxysmal atrial fibrillation: Secondary | ICD-10-CM

## 2020-10-05 DIAGNOSIS — R55 Syncope and collapse: Secondary | ICD-10-CM

## 2020-10-05 DIAGNOSIS — I779 Disorder of arteries and arterioles, unspecified: Secondary | ICD-10-CM

## 2020-10-05 DIAGNOSIS — I350 Nonrheumatic aortic (valve) stenosis: Secondary | ICD-10-CM | POA: Diagnosis not present

## 2020-10-05 DIAGNOSIS — I739 Peripheral vascular disease, unspecified: Secondary | ICD-10-CM

## 2020-10-05 DIAGNOSIS — I10 Essential (primary) hypertension: Secondary | ICD-10-CM

## 2020-10-05 DIAGNOSIS — I2510 Atherosclerotic heart disease of native coronary artery without angina pectoris: Secondary | ICD-10-CM | POA: Diagnosis not present

## 2020-10-05 DIAGNOSIS — Z952 Presence of prosthetic heart valve: Secondary | ICD-10-CM

## 2020-10-05 DIAGNOSIS — E782 Mixed hyperlipidemia: Secondary | ICD-10-CM

## 2020-10-05 DIAGNOSIS — C9110 Chronic lymphocytic leukemia of B-cell type not having achieved remission: Secondary | ICD-10-CM

## 2020-10-05 NOTE — Patient Instructions (Addendum)
Medication Instructions:  Your physician recommends that you continue on your current medications as directed. Please refer to the Current Medication list given to you today.  *If you need a refill on your cardiac medications before your next appointment, please call your pharmacy*   Lab Work: -NONE  If you have labs (blood work) drawn today and your tests are completely normal, you will receive your results only by: [place] (if you have MyChart) OR A paper copy in the mail If you have any lab test that is abnormal or we need to change your treatment, we will call you to review the results.   Testing/Procedures: Your physician has requested that you have a carotid duplex. This test is an ultrasound of the carotid arteries in your neck. It looks at blood flow through these arteries that supply the brain with blood. Allow one hour for this exam. There are no restrictions or special instructions.  Your physician has requested that you have a lower extremity arterial exercise duplex. During this test, exercise and ultrasound are used to evaluate arterial blood flow in the legs. Allow one hour for this exam. There are no restrictions or special instructions.  Your physician has requested that you have an ankle brachial index (ABI). During this test an ultrasound and blood pressure cuff are used to evaluate the arteries that supply the arms and legs with blood. Allow thirty minutes for this exam. There are no restrictions or special instructions.  You are scheduled for a Myocardial Perfusion Imaging Study on Monday, October 10  at 10:15 am.   Please arrive 15 minutes prior to your appointment time for registration and insurance purposes.   The test will take approximately 3 to 4 hours to complete; you may bring reading material. If someone comes with you to your appointment, they will need to remain in the main lobby due to limited space in the testing area.   How to prepare for your  Myocardial Perfusion test:   Do not eat or drink 3 hours prior to your test, except you may have water.    Do not consume products containing caffeine (regular or decaffeinated) 12 hours prior to your test (ex: coffee, chocolate, soda, tea)   Do bring a list of your current medications with you. If not listed below, you may take your medications as normal.    Bring any held medication to your appointment, as you may be required to take it once the test is complete.   Do wear comfortable clothes (no dresses or overalls) and walking shoes. Tennis shoes are preferred. No heels or open toed shoes.  Do not wear perfume or lotions (deodorant is allowed).   If these instructions are not followed, you test will have to be rescheduled.   Please report to [address] for your test. If you have questions or concerns about your appointment, please call the Nuclear Lab at [phone].  If you cannot keep your appointment, please provide 24 hour notification to the Nuclear lab to avoid a possible $50 charge to your account.     Follow-Up: At [facility], you and your health needs are our priority.  As part of our continuing mission to provide you with exceptional heart care, we have created designated Provider Care Teams.  These Care Teams include your primary Cardiologist (physician) and Advanced Practice Providers (APPs -  Physician Assistants and Nurse Practitioners) who all work together to provide you with the care you need,  when you need it.  We recommend signing up for the patient portal called "MyChart".  Sign up information is provided on this After Visit Summary.  MyChart is used to connect with patients for Virtual Visits (Telemedicine).  Patients are able to view lab/test results, encounter notes, upcoming appointments, etc.  Non-urgent messages can be sent to your provider as well.   To learn more about what you can do with MyChart, go to [email or web address].     Your next appointment:   6 month(s)  The format for your next appointment:   In Person  Provider:   Sherren Mocha, MD   Other Instructions You have been referred to EP to discuss A-fib/Loop recorder. Someone from our office should be calling you to set up appointment.   No Driving for 6 months from the time of your passing out.

## 2020-10-08 ENCOUNTER — Telehealth: Payer: Self-pay

## 2020-10-08 ENCOUNTER — Other Ambulatory Visit (HOSPITAL_COMMUNITY): Payer: Self-pay

## 2020-10-08 ENCOUNTER — Telehealth (HOSPITAL_COMMUNITY): Payer: Self-pay | Admitting: *Deleted

## 2020-10-08 NOTE — Telephone Encounter (Signed)
Patient given detailed instructions per Myocardial Perfusion Study Information Sheet for the test on 10/15/20 at 10:15. Patient notified to arrive 15 minutes early and that it is imperative to arrive on time for appointment to keep from having the test rescheduled.  If you need to cancel or reschedule your appointment, please call the office within 24 hours of your appointment. . Patient verbalized understanding.Nicole Mercado

## 2020-10-08 NOTE — Telephone Encounter (Signed)
Oral Oncology Patient Advocate Encounter  Was successful in securing patient a $8000 grant from [facility] to provide copayment coverage for Calquence.  This will keep the out of pocket expense at $0.     [employer] ID: [id number]  I have spoken with the patient.   The billing information is as follows and has been shared with [place]: [id number] PCN: [id number] Member ID: [id number] Group ID: [id number] Dates of Eligibility: 11/07/20 through 11/06/21  Fund:  [place] [facility] [place] Phone [phone] Fax [phone] 10/08/2020 9:52 AM

## 2020-10-09 ENCOUNTER — Other Ambulatory Visit: Payer: Self-pay

## 2020-10-09 ENCOUNTER — Ambulatory Visit (HOSPITAL_COMMUNITY)
Admission: RE | Admit: 2020-10-09 | Discharge: 2020-10-09 | Disposition: A | Discharge status: Home / Self Care | Payer: Medicare Other | Source: Ambulatory Visit | Attending: Cardiovascular Disease | Admitting: Cardiovascular Disease

## 2020-10-09 DIAGNOSIS — R55 Syncope and collapse: Secondary | ICD-10-CM

## 2020-10-09 DIAGNOSIS — I779 Disorder of arteries and arterioles, unspecified: Secondary | ICD-10-CM | POA: Diagnosis not present

## 2020-10-09 NOTE — Addendum Note (Signed)
Addended byKathlen Mody, Nicki Reaper T on: 10/09/2020 12:31 PM   Modules accepted: Orders

## 2020-10-11 ENCOUNTER — Encounter: Payer: Self-pay | Admitting: Physician Assistant

## 2020-10-12 ENCOUNTER — Ambulatory Visit (HOSPITAL_COMMUNITY): Payer: Medicare Other

## 2020-10-15 ENCOUNTER — Other Ambulatory Visit (HOSPITAL_COMMUNITY): Payer: Self-pay

## 2020-10-15 ENCOUNTER — Other Ambulatory Visit: Payer: Self-pay | Admitting: *Deleted

## 2020-10-15 ENCOUNTER — Ambulatory Visit (HOSPITAL_COMMUNITY): Payer: Medicare Other

## 2020-10-15 DIAGNOSIS — I779 Disorder of arteries and arterioles, unspecified: Secondary | ICD-10-CM

## 2020-10-15 DIAGNOSIS — I2510 Atherosclerotic heart disease of native coronary artery without angina pectoris: Secondary | ICD-10-CM

## 2020-10-16 ENCOUNTER — Inpatient Hospital Stay (HOSPITAL_COMMUNITY): Admission: RE | Admit: 2020-10-16 | Payer: Medicare Other | Source: Ambulatory Visit

## 2020-10-16 ENCOUNTER — Encounter (HOSPITAL_COMMUNITY): Payer: Self-pay | Admitting: *Deleted

## 2020-10-16 ENCOUNTER — Telehealth (HOSPITAL_COMMUNITY): Payer: Self-pay | Admitting: *Deleted

## 2020-10-16 NOTE — Telephone Encounter (Signed)
MyChart letter sent outlining instructions for stress test on 10/22/20 @ 10:00.

## 2020-10-17 ENCOUNTER — Other Ambulatory Visit (HOSPITAL_COMMUNITY): Payer: Self-pay

## 2020-10-17 ENCOUNTER — Telehealth: Payer: Self-pay

## 2020-10-17 DIAGNOSIS — C9110 Chronic lymphocytic leukemia of B-cell type not having achieved remission: Secondary | ICD-10-CM

## 2020-10-17 MED ORDER — CALQUENCE 100 MG PO TABS
100.0000 mg | ORAL_TABLET | Freq: Two times a day (BID) | ORAL | 1 refills | Status: DC
  Filled 2020-10-17: qty 60, fill #0
  Filled 2020-10-17: qty 60, 30d supply, fill #0
  Filled 2020-11-15: qty 60, 30d supply, fill #1

## 2020-10-17 NOTE — Telephone Encounter (Signed)
Oral Chemotherapy Pharmacist Encounter    Dosage formulation change required for Calquence (acalabrutinib). Manufacturer is phasing out previous dosage forms of capsules and changing to tablet formulation. All future prescriptions sent will need to be tablet formulation due to inability to obtain capsules.    Patient's most recent prescription for Calquence has been switched to tablet formulation to be dispensed form the [facility].   Patient was notified of the change and discussed with patient the differences.   Drema Halon, PharmD Hematology/Oncology Clinical Pharmacist [facility] [phone] 10/17/2020 1:38 PM

## 2020-10-18 ENCOUNTER — Other Ambulatory Visit: Payer: Self-pay | Admitting: Physician Assistant

## 2020-10-18 ENCOUNTER — Other Ambulatory Visit: Payer: Self-pay

## 2020-10-18 ENCOUNTER — Ambulatory Visit (HOSPITAL_COMMUNITY)
Admission: RE | Admit: 2020-10-18 | Discharge: 2020-10-18 | Disposition: A | Discharge status: Home / Self Care | Payer: Medicare Other | Source: Ambulatory Visit | Attending: Cardiology | Admitting: Cardiology

## 2020-10-18 DIAGNOSIS — I739 Peripheral vascular disease, unspecified: Secondary | ICD-10-CM

## 2020-10-18 DIAGNOSIS — Z952 Presence of prosthetic heart valve: Secondary | ICD-10-CM

## 2020-10-18 DIAGNOSIS — I350 Nonrheumatic aortic (valve) stenosis: Secondary | ICD-10-CM

## 2020-10-18 DIAGNOSIS — R55 Syncope and collapse: Secondary | ICD-10-CM

## 2020-10-18 DIAGNOSIS — I10 Essential (primary) hypertension: Secondary | ICD-10-CM

## 2020-10-18 DIAGNOSIS — E782 Mixed hyperlipidemia: Secondary | ICD-10-CM | POA: Diagnosis present

## 2020-10-18 DIAGNOSIS — C9110 Chronic lymphocytic leukemia of B-cell type not having achieved remission: Secondary | ICD-10-CM

## 2020-10-18 DIAGNOSIS — I2510 Atherosclerotic heart disease of native coronary artery without angina pectoris: Secondary | ICD-10-CM | POA: Diagnosis present

## 2020-10-19 ENCOUNTER — Encounter: Payer: Self-pay | Admitting: Physician Assistant

## 2020-10-19 DIAGNOSIS — I739 Peripheral vascular disease, unspecified: Secondary | ICD-10-CM | POA: Insufficient documentation

## 2020-10-22 ENCOUNTER — Ambulatory Visit (HOSPITAL_COMMUNITY): Payer: Medicare Other | Attending: Cardiology

## 2020-10-22 ENCOUNTER — Other Ambulatory Visit: Payer: Self-pay

## 2020-10-22 ENCOUNTER — Telehealth: Payer: Self-pay | Admitting: Cardiovascular Disease

## 2020-10-22 ENCOUNTER — Other Ambulatory Visit (HOSPITAL_COMMUNITY): Payer: Self-pay

## 2020-10-22 ENCOUNTER — Other Ambulatory Visit: Payer: Self-pay | Admitting: *Deleted

## 2020-10-22 DIAGNOSIS — I350 Nonrheumatic aortic (valve) stenosis: Secondary | ICD-10-CM | POA: Diagnosis present

## 2020-10-22 DIAGNOSIS — I739 Peripheral vascular disease, unspecified: Secondary | ICD-10-CM | POA: Diagnosis present

## 2020-10-22 DIAGNOSIS — C9110 Chronic lymphocytic leukemia of B-cell type not having achieved remission: Secondary | ICD-10-CM

## 2020-10-22 DIAGNOSIS — I2510 Atherosclerotic heart disease of native coronary artery without angina pectoris: Secondary | ICD-10-CM | POA: Diagnosis present

## 2020-10-22 DIAGNOSIS — E782 Mixed hyperlipidemia: Secondary | ICD-10-CM

## 2020-10-22 DIAGNOSIS — R55 Syncope and collapse: Secondary | ICD-10-CM | POA: Diagnosis present

## 2020-10-22 DIAGNOSIS — Z952 Presence of prosthetic heart valve: Secondary | ICD-10-CM | POA: Diagnosis present

## 2020-10-22 DIAGNOSIS — I10 Essential (primary) hypertension: Secondary | ICD-10-CM

## 2020-10-22 LAB — MYOCARDIAL PERFUSION IMAGING
Base ST Depression (mm): 0 mm
LV dias vol: 14 mL (ref 46–106)
LV sys vol: 50 mL
Nuc Stress EF: 71 %
Peak HR: 88 {beats}/min
Rest HR: 72 {beats}/min
Rest Nuclear Isotope Dose: 10.9 mCi
SDS: 4
SRS: 7
SSS: 11
ST Depression (mm): 0 mm
Stress Nuclear Isotope Dose: 32.1 mCi
TID: 1.12

## 2020-10-22 MED ORDER — TECHNETIUM TC 99M TETROFOSMIN IV KIT
32.1000 | PACK | Freq: Once | INTRAVENOUS | Status: AC | PRN
  Administered 2020-10-22: 32.1 via INTRAVENOUS
  Filled 2020-10-22: qty 33

## 2020-10-22 MED ORDER — TECHNETIUM TC 99M TETROFOSMIN IV KIT
10.9000 | PACK | Freq: Once | INTRAVENOUS | Status: AC | PRN
  Administered 2020-10-22: 10.9 via INTRAVENOUS
  Filled 2020-10-22: qty 11

## 2020-10-22 MED ORDER — REGADENOSON 0.4 MG/5ML IV SOLN
0.4000 mg | Freq: Once | INTRAVENOUS | Status: AC
  Administered 2020-10-22: 0.4 mg via INTRAVENOUS

## 2020-10-22 NOTE — Telephone Encounter (Signed)
Patient called to get the results of her test.

## 2020-10-23 ENCOUNTER — Encounter: Payer: Self-pay | Admitting: Physician Assistant

## 2020-10-23 ENCOUNTER — Other Ambulatory Visit (HOSPITAL_COMMUNITY): Payer: Self-pay

## 2020-10-24 ENCOUNTER — Other Ambulatory Visit: Payer: Self-pay | Admitting: *Deleted

## 2020-10-24 DIAGNOSIS — R55 Syncope and collapse: Secondary | ICD-10-CM

## 2020-11-12 ENCOUNTER — Other Ambulatory Visit: Payer: Self-pay | Admitting: Physician Assistant

## 2020-11-14 ENCOUNTER — Other Ambulatory Visit (HOSPITAL_COMMUNITY): Payer: Self-pay

## 2020-11-15 ENCOUNTER — Other Ambulatory Visit (HOSPITAL_COMMUNITY): Payer: Self-pay

## 2020-11-20 ENCOUNTER — Other Ambulatory Visit (HOSPITAL_COMMUNITY): Payer: Self-pay

## 2020-11-23 ENCOUNTER — Other Ambulatory Visit: Payer: Medicare Other

## 2020-11-23 ENCOUNTER — Ambulatory Visit: Payer: Medicare Other | Admitting: Hematology and Oncology

## 2020-11-30 ENCOUNTER — Inpatient Hospital Stay: Payer: Medicare Other | Attending: Hematology and Oncology

## 2020-11-30 ENCOUNTER — Encounter: Payer: Self-pay | Admitting: Hematology and Oncology

## 2020-11-30 ENCOUNTER — Inpatient Hospital Stay: Payer: Medicare Other | Admitting: Hematology and Oncology

## 2020-11-30 ENCOUNTER — Other Ambulatory Visit: Payer: Self-pay

## 2020-11-30 DIAGNOSIS — I10 Essential (primary) hypertension: Secondary | ICD-10-CM | POA: Insufficient documentation

## 2020-11-30 DIAGNOSIS — Z7982 Long term (current) use of aspirin: Secondary | ICD-10-CM | POA: Insufficient documentation

## 2020-11-30 DIAGNOSIS — Z9221 Personal history of antineoplastic chemotherapy: Secondary | ICD-10-CM | POA: Diagnosis not present

## 2020-11-30 DIAGNOSIS — D696 Thrombocytopenia, unspecified: Secondary | ICD-10-CM | POA: Insufficient documentation

## 2020-11-30 DIAGNOSIS — Z79899 Other long term (current) drug therapy: Secondary | ICD-10-CM | POA: Diagnosis not present

## 2020-11-30 DIAGNOSIS — C9110 Chronic lymphocytic leukemia of B-cell type not having achieved remission: Secondary | ICD-10-CM | POA: Diagnosis not present

## 2020-11-30 LAB — CBC WITH DIFFERENTIAL/PLATELET
Abs Immature Granulocytes: 0.01 10*3/uL (ref 0.00–0.07)
Basophils Absolute: 0 10*3/uL (ref 0.0–0.1)
Basophils Relative: 1 %
Eosinophils Absolute: 0 10*3/uL (ref 0.0–0.5)
Eosinophils Relative: 0 %
HCT: 38.7 % (ref 36.0–46.0)
Hemoglobin: 13.2 g/dL (ref 12.0–15.0)
Immature Granulocytes: 0 %
Lymphocytes Relative: 43 %
Lymphs Abs: 3 10*3/uL (ref 0.7–4.0)
MCH: 32.8 pg (ref 26.0–34.0)
MCHC: 34.1 g/dL (ref 30.0–36.0)
MCV: 96.3 fL (ref 80.0–100.0)
Monocytes Absolute: 0.7 10*3/uL (ref 0.1–1.0)
Monocytes Relative: 10 %
Neutro Abs: 3.3 10*3/uL (ref 1.7–7.7)
Neutrophils Relative %: 46 %
Platelets: 151 10*3/uL (ref 150–400)
RBC: 4.02 MIL/uL (ref 3.87–5.11)
RDW: 11.6 % (ref 11.5–15.5)
WBC: 7 10*3/uL (ref 4.0–10.5)
nRBC: 0 % (ref 0.0–0.2)

## 2020-11-30 LAB — COMPREHENSIVE METABOLIC PANEL
ALT: 16 U/L (ref 0–44)
AST: 23 U/L (ref 15–41)
Albumin: 4 g/dL (ref 3.5–5.0)
Alkaline Phosphatase: 60 U/L (ref 38–126)
Anion gap: 10 (ref 5–15)
BUN: 18 mg/dL (ref 8–23)
CO2: 26 mmol/L (ref 22–32)
Calcium: 9.7 mg/dL (ref 8.9–10.3)
Chloride: 102 mmol/L (ref 98–111)
Creatinine, Ser: 1.08 mg/dL — ABNORMAL HIGH (ref 0.44–1.00)
GFR, Estimated: 52 mL/min — ABNORMAL LOW (ref >60)
Glucose, Bld: 78 mg/dL (ref 70–99)
Potassium: 3.9 mmol/L (ref 3.5–5.1)
Sodium: 138 mmol/L (ref 135–145)
Total Bilirubin: 1 mg/dL (ref 0.3–1.2)
Total Protein: 6.2 g/dL — ABNORMAL LOW (ref 6.5–8.1)

## 2020-11-30 NOTE — Assessment & Plan Note (Signed)
She has complete resolution of thrombocytopenia I believe this is indicative of excellent response to Calquence She will continue aspirin therapy

## 2020-11-30 NOTE — Progress Notes (Signed)
[facility] OFFICE PROGRESS NOTE  Patient Care Team: Kristie Cowman, MD as PCP - General (Family Medicine) Sherren Mocha, MD as PCP - Cardiology (Cardiology) Kristie Cowman, MD (Family Medicine) Terance Ice, MD (Inactive) (Cardiology) Sharmon Revere as Physician Assistant (Cardiology)  ASSESSMENT & PLAN:  CLL (chronic lymphocytic leukemia) [facility]) Her CBC is completely normal and she has no side effects of treatment except for occasional bruises We will continue treatment indefinitely I plan to see her again in 4 months for further follow-up  Essential hypertension Blood pressure is usually profoundly elevated in the clinic's visit I would defer to her cardiologist for further management  Thrombocytopenia [facility]) She has complete resolution of thrombocytopenia I believe this is indicative of excellent response to Calquence She will continue aspirin therapy  No orders of the defined types were placed in this encounter.   All questions were answered. The patient knows to call the clinic with any problems, questions or concerns. The total time spent in the appointment was 20 minutes encounter with patients including review of chart and various tests results, discussions about plan of care and coordination of care plan   Heath Lark, MD 11/30/2020 4:35 PM  INTERVAL HISTORY: Please see below for problem oriented charting. she returns for treatment follow-up on Calquence for recurrent CLL She is doing well and compliant taking medications as directed No recent infection Denies any recent cardiac events  REVIEW OF SYSTEMS:   Constitutional: Denies fevers, chills or abnormal weight loss Eyes: Denies blurriness of vision Ears, nose, mouth, throat, and face: Denies mucositis or sore throat Respiratory: Denies cough, dyspnea or wheezes Cardiovascular: Denies palpitation, chest discomfort or lower extremity swelling Gastrointestinal:  Denies nausea, heartburn  or change in bowel habits Skin: Denies abnormal skin rashes Lymphatics: Denies new lymphadenopathy or easy bruising Neurological:Denies numbness, tingling or new weaknesses Behavioral/Psych: Mood is stable, no new changes  All other systems were reviewed with the patient and are negative.  I have reviewed the past medical history, past surgical history, social history and family history with the patient and they are unchanged from previous note.  ALLERGIES:  has No Known Allergies.  MEDICATIONS:  Current Outpatient Medications  Medication Sig Dispense Refill   Acalabrutinib Maleate (CALQUENCE) 100 MG TABS Take 1 tablet (100 mg) by mouth 2 (two) times daily. 60 tablet 1   acetaminophen (TYLENOL) 325 MG tablet Take 650 mg by mouth every 6 (six) hours as needed for mild pain or headache.      amLODipine (NORVASC) 5 MG tablet Take 1.5 tablets (7.5 mg total) by mouth daily. 135 tablet 3   amoxicillin (AMOXIL) 500 MG capsule TAKE FOUR CAPSULES BY MOUTH ONE HOUR BEFORE APPOINTMENT 8 capsule 3   aspirin 81 MG tablet Take 81 mg by mouth daily.     atorvastatin (LIPITOR) 10 MG tablet TAKE 1 TABLET BY MOUTH ONCE DAILY 6 IN THE EVENING 90 tablet 2   Calcium Carb-Cholecalciferol 500-125 MG-UNIT TABS Take 1 tablet by mouth daily.     metoprolol succinate (TOPROL-XL) 50 MG 24 hr tablet Take 1 tablet by mouth once daily 90 tablet 0   Multiple Vitamins-Minerals (HM MULTIVITAMIN ADULT GUMMY PO) Chew two gummies daily     oxybutynin (DITROPAN-XL) 5 MG 24 hr tablet Take 5 mg by mouth at bedtime.     No current facility-administered medications for this visit.    SUMMARY OF ONCOLOGIC HISTORY: Oncology History  CLL (chronic lymphocytic leukemia) ([place])  03/21/2011 Initial Diagnosis  CLL (chronic lymphocytic leukemia)   01/14/2014 - 01/31/2014 Hospital Admission   The patient was hospitalized and was found to severe aortic stenosis causing syncopal episode. She received treatment with steroids with minimum  success and required platelet transfusion.   09/18/2014 Imaging   CT scan of the chest, abdomen and pelvis show lymphadenopathy but low level burden of disease.   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 There was no stenosis. Mild peri-valvular aortic insufficiency is noted   11/04/2014 -  Chemotherapy   She is started on Ibrutinib   06/17/2016 Imaging   1. No lymphadenopathy noted in the chest, abdomen or pelvis to suggest recurrent disease. 2. Aortic atherosclerosis, in addition to left main and 3 vessel coronary artery disease. Status post median sternotomy for CABG including LIMA to the LAD. 3. 3 mm nonobstructive calculus in the lower pole collecting system of the left kidney. Mild atrophy of the left kidney. 4. Mild colonic diverticulosis, without evidence of acute diverticulitis at this time. 5. Additional incidental findings, as above.   12/06/2018 Imaging   1. Increase in size and number of abdominal retroperitoneal and small bowel mesenteric lymph nodes, consistent with recurrent CLL. 2.  Aortic atherosclerosis (ICD10-170.0).   12/07/2018 Cancer Staging   Staging form: Chronic Lymphocytic Leukemia / Small Lymphocytic Lymphoma, AJCC 8th Edition - Clinical stage from 12/07/2018: Modified Rai Stage IV (Modified Rai risk: High, Binet: Stage B, Lugano: Stage II, Lymphocytosis: Absent, Adenopathy: Present, Organomegaly: Absent, Anemia: Absent, Thrombocytopenia: Present) - Signed by Heath Lark, MD on 12/07/2018    01/07/2019 -  Chemotherapy   The patient had acalabrutinib for chemotherapy treatment.       PHYSICAL EXAMINATION: ECOG PERFORMANCE STATUS: 0 - Asymptomatic  Vitals:   11/30/20 0946  BP: (!) 168/42  Pulse: 64  Resp: 16  Temp: 97.6 F (36.4 C)  SpO2: 97%   Filed Weights   11/30/20 0946  Weight: 93 lb 9.6 oz (42.5 kg)    GENERAL:alert, no  distress and comfortable SKIN: Noted minor skin bruises NEURO: alert & oriented x 3 with fluent speech, no focal motor/sensory deficits  LABORATORY DATA:  I have reviewed the data as listed    Component Value Date/Time   NA 138 11/30/2020 0931   NA 137 04/05/2019 0829   NA 137 12/18/2016 0916   K 3.9 11/30/2020 0931   K 4.4 12/18/2016 0916   CL 102 11/30/2020 0931   CL 105 05/20/2012 0823   CO2 26 11/30/2020 0931   CO2 25 12/18/2016 0916   GLUCOSE 78 11/30/2020 0931   GLUCOSE 88 12/18/2016 0916   GLUCOSE 70 05/20/2012 0823   BUN 18 11/30/2020 0931   BUN 21 04/05/2019 0829   BUN 14.0 12/18/2016 0916   CREATININE 1.08 (H) 11/30/2020 0931   CREATININE 1.0 12/18/2016 0916   CALCIUM 9.7 11/30/2020 0931   CALCIUM 9.9 12/18/2016 0916   PROT 6.2 (L) 11/30/2020 0931   PROT 6.1 (L) 12/18/2016 0916   ALBUMIN 4.0 11/30/2020 0931   ALBUMIN 3.9 12/18/2016 0916   AST 23 11/30/2020 0931   AST 28 12/18/2016 0916   ALT 16 11/30/2020 0931   ALT 16 12/18/2016 0916   ALKPHOS 60 11/30/2020 0931   ALKPHOS 54 12/18/2016 0916   BILITOT 1.0 11/30/2020 0931   BILITOT 1.03 12/18/2016 0916   GFRNONAA 52 (L) 11/30/2020 0931   GFRAA 40 (L) 06/17/2019  0845    No results found for: SPEP, UPEP  Lab Results  Component Value Date   WBC 7.0 11/30/2020   NEUTROABS 3.3 11/30/2020   HGB 13.2 11/30/2020   HCT 38.7 11/30/2020   MCV 96.3 11/30/2020   PLT 151 11/30/2020      Chemistry      Component Value Date/Time   NA 138 11/30/2020 0931   NA 137 04/05/2019 0829   NA 137 12/18/2016 0916   K 3.9 11/30/2020 0931   K 4.4 12/18/2016 0916   CL 102 11/30/2020 0931   CL 105 05/20/2012 0823   CO2 26 11/30/2020 0931   CO2 25 12/18/2016 0916   BUN 18 11/30/2020 0931   BUN 21 04/05/2019 0829   BUN 14.0 12/18/2016 0916   CREATININE 1.08 (H) 11/30/2020 0931   CREATININE 1.0 12/18/2016 0916      Component Value Date/Time   CALCIUM 9.7 11/30/2020 0931   CALCIUM 9.9 12/18/2016 0916   ALKPHOS 60  11/30/2020 0931   ALKPHOS 54 12/18/2016 0916   AST 23 11/30/2020 0931   AST 28 12/18/2016 0916   ALT 16 11/30/2020 0931   ALT 16 12/18/2016 0916   BILITOT 1.0 11/30/2020 0931   BILITOT 1.03 12/18/2016 0916

## 2020-11-30 NOTE — Assessment & Plan Note (Signed)
Her CBC is completely normal and she has no side effects of treatment except for occasional bruises We will continue treatment indefinitely I plan to see her again in 4 months for further follow-up

## 2020-11-30 NOTE — Assessment & Plan Note (Signed)
Blood pressure is usually profoundly elevated in the clinic's visit I would defer to her cardiologist for further management

## 2020-12-12 ENCOUNTER — Other Ambulatory Visit (HOSPITAL_COMMUNITY): Payer: Medicare Other

## 2020-12-13 ENCOUNTER — Other Ambulatory Visit: Payer: Self-pay | Admitting: Hematology and Oncology

## 2020-12-13 ENCOUNTER — Other Ambulatory Visit (HOSPITAL_COMMUNITY): Payer: Self-pay

## 2020-12-13 DIAGNOSIS — C9110 Chronic lymphocytic leukemia of B-cell type not having achieved remission: Secondary | ICD-10-CM

## 2020-12-13 MED ORDER — CALQUENCE 100 MG PO TABS
100.0000 mg | ORAL_TABLET | Freq: Two times a day (BID) | ORAL | 1 refills | Status: DC
  Filled 2020-12-13: qty 60, 30d supply, fill #0
  Filled 2021-01-10: qty 60, 30d supply, fill #1

## 2020-12-17 ENCOUNTER — Other Ambulatory Visit (HOSPITAL_COMMUNITY): Payer: Self-pay

## 2020-12-27 ENCOUNTER — Other Ambulatory Visit: Payer: Self-pay | Admitting: Cardiovascular Disease

## 2021-01-10 ENCOUNTER — Other Ambulatory Visit (HOSPITAL_COMMUNITY): Payer: Self-pay

## 2021-01-11 ENCOUNTER — Other Ambulatory Visit (HOSPITAL_COMMUNITY): Payer: Self-pay

## 2021-01-14 ENCOUNTER — Other Ambulatory Visit (HOSPITAL_COMMUNITY): Payer: Self-pay

## 2021-02-06 ENCOUNTER — Other Ambulatory Visit (HOSPITAL_COMMUNITY): Payer: Self-pay

## 2021-02-06 ENCOUNTER — Other Ambulatory Visit: Payer: Self-pay | Admitting: Hematology and Oncology

## 2021-02-06 DIAGNOSIS — C9110 Chronic lymphocytic leukemia of B-cell type not having achieved remission: Secondary | ICD-10-CM

## 2021-02-06 MED ORDER — CALQUENCE 100 MG PO TABS
100.0000 mg | ORAL_TABLET | Freq: Two times a day (BID) | ORAL | 1 refills | Status: DC
Start: 2021-02-06 — End: 2021-03-21
  Filled 2021-02-06: qty 60, 30d supply, fill #0
  Filled 2021-03-05: qty 60, 30d supply, fill #1

## 2021-02-12 ENCOUNTER — Other Ambulatory Visit (HOSPITAL_COMMUNITY): Payer: Self-pay

## 2021-02-14 ENCOUNTER — Other Ambulatory Visit: Payer: Self-pay | Admitting: Cardiovascular Disease

## 2021-03-05 ENCOUNTER — Other Ambulatory Visit (HOSPITAL_COMMUNITY): Payer: Self-pay

## 2021-03-08 ENCOUNTER — Other Ambulatory Visit: Payer: Self-pay | Admitting: Physician Assistant

## 2021-03-11 ENCOUNTER — Other Ambulatory Visit (HOSPITAL_COMMUNITY): Payer: Self-pay

## 2021-03-19 ENCOUNTER — Emergency Department (HOSPITAL_COMMUNITY): Payer: Medicare Other

## 2021-03-19 ENCOUNTER — Other Ambulatory Visit: Payer: Self-pay

## 2021-03-19 ENCOUNTER — Encounter (HOSPITAL_COMMUNITY): Payer: Self-pay

## 2021-03-19 ENCOUNTER — Inpatient Hospital Stay (HOSPITAL_COMMUNITY): Payer: Medicare Other

## 2021-03-19 ENCOUNTER — Inpatient Hospital Stay (HOSPITAL_COMMUNITY)
Admission: EM | Admit: 2021-03-19 | Discharge: 2021-03-21 | DRG: 309 | Disposition: A | Discharge status: Home / Self Care | Payer: Medicare Other | Attending: Internal Medicine | Admitting: Internal Medicine

## 2021-03-19 DIAGNOSIS — I350 Nonrheumatic aortic (valve) stenosis: Secondary | ICD-10-CM

## 2021-03-19 DIAGNOSIS — C9110 Chronic lymphocytic leukemia of B-cell type not having achieved remission: Secondary | ICD-10-CM | POA: Diagnosis not present

## 2021-03-19 DIAGNOSIS — E876 Hypokalemia: Secondary | ICD-10-CM | POA: Diagnosis present

## 2021-03-19 DIAGNOSIS — I129 Hypertensive chronic kidney disease with stage 1 through stage 4 chronic kidney disease, or unspecified chronic kidney disease: Secondary | ICD-10-CM | POA: Diagnosis present

## 2021-03-19 DIAGNOSIS — Z7982 Long term (current) use of aspirin: Secondary | ICD-10-CM | POA: Diagnosis not present

## 2021-03-19 DIAGNOSIS — E1151 Type 2 diabetes mellitus with diabetic peripheral angiopathy without gangrene: Secondary | ICD-10-CM | POA: Diagnosis present

## 2021-03-19 DIAGNOSIS — I480 Paroxysmal atrial fibrillation: Principal | ICD-10-CM | POA: Diagnosis present

## 2021-03-19 DIAGNOSIS — I4819 Other persistent atrial fibrillation: Secondary | ICD-10-CM | POA: Diagnosis present

## 2021-03-19 DIAGNOSIS — I739 Peripheral vascular disease, unspecified: Secondary | ICD-10-CM | POA: Diagnosis not present

## 2021-03-19 DIAGNOSIS — I10 Essential (primary) hypertension: Secondary | ICD-10-CM | POA: Diagnosis not present

## 2021-03-19 DIAGNOSIS — Z953 Presence of xenogenic heart valve: Secondary | ICD-10-CM

## 2021-03-19 DIAGNOSIS — N1831 Chronic kidney disease, stage 3a: Secondary | ICD-10-CM | POA: Diagnosis present

## 2021-03-19 DIAGNOSIS — Z79899 Other long term (current) drug therapy: Secondary | ICD-10-CM

## 2021-03-19 DIAGNOSIS — Z951 Presence of aortocoronary bypass graft: Secondary | ICD-10-CM

## 2021-03-19 DIAGNOSIS — I951 Orthostatic hypotension: Secondary | ICD-10-CM | POA: Diagnosis present

## 2021-03-19 DIAGNOSIS — Z8249 Family history of ischemic heart disease and other diseases of the circulatory system: Secondary | ICD-10-CM | POA: Diagnosis not present

## 2021-03-19 DIAGNOSIS — Z952 Presence of prosthetic heart valve: Secondary | ICD-10-CM

## 2021-03-19 DIAGNOSIS — Z20822 Contact with and (suspected) exposure to covid-19: Secondary | ICD-10-CM | POA: Diagnosis present

## 2021-03-19 DIAGNOSIS — R55 Syncope and collapse: Secondary | ICD-10-CM | POA: Diagnosis not present

## 2021-03-19 DIAGNOSIS — I2510 Atherosclerotic heart disease of native coronary artery without angina pectoris: Secondary | ICD-10-CM | POA: Diagnosis present

## 2021-03-19 DIAGNOSIS — E871 Hypo-osmolality and hyponatremia: Secondary | ICD-10-CM | POA: Diagnosis present

## 2021-03-19 DIAGNOSIS — I4891 Unspecified atrial fibrillation: Secondary | ICD-10-CM | POA: Diagnosis not present

## 2021-03-19 DIAGNOSIS — Z823 Family history of stroke: Secondary | ICD-10-CM

## 2021-03-19 DIAGNOSIS — I252 Old myocardial infarction: Secondary | ICD-10-CM

## 2021-03-19 DIAGNOSIS — N183 Chronic kidney disease, stage 3 unspecified: Secondary | ICD-10-CM | POA: Diagnosis present

## 2021-03-19 LAB — COMPREHENSIVE METABOLIC PANEL
ALT: 27 U/L (ref 0–44)
AST: 24 U/L (ref 15–41)
Albumin: 3.3 g/dL — ABNORMAL LOW (ref 3.5–5.0)
Alkaline Phosphatase: 72 U/L (ref 38–126)
Anion gap: 8 (ref 5–15)
BUN: 28 mg/dL — ABNORMAL HIGH (ref 8–23)
CO2: 25 mmol/L (ref 22–32)
Calcium: 9 mg/dL (ref 8.9–10.3)
Chloride: 99 mmol/L (ref 98–111)
Creatinine, Ser: 0.93 mg/dL (ref 0.44–1.00)
GFR, Estimated: >60 mL/min (ref >60)
Glucose, Bld: 119 mg/dL — ABNORMAL HIGH (ref 70–99)
Potassium: 3.3 mmol/L — ABNORMAL LOW (ref 3.5–5.1)
Sodium: 132 mmol/L — ABNORMAL LOW (ref 135–145)
Total Bilirubin: 0.6 mg/dL (ref 0.3–1.2)
Total Protein: 5.8 g/dL — ABNORMAL LOW (ref 6.5–8.1)

## 2021-03-19 LAB — PROTIME-INR
INR: 1 (ref 0.8–1.2)
Prothrombin Time: 13.5 seconds (ref 11.4–15.2)

## 2021-03-19 LAB — CBC
HCT: 41.8 % (ref 36.0–46.0)
Hemoglobin: 14.3 g/dL (ref 12.0–15.0)
MCH: 33 pg (ref 26.0–34.0)
MCHC: 34.2 g/dL (ref 30.0–36.0)
MCV: 96.5 fL (ref 80.0–100.0)
Platelets: 226 10*3/uL (ref 150–400)
RBC: 4.33 MIL/uL (ref 3.87–5.11)
RDW: 12 % (ref 11.5–15.5)
WBC: 12.1 10*3/uL — ABNORMAL HIGH (ref 4.0–10.5)
nRBC: 0 % (ref 0.0–0.2)

## 2021-03-19 LAB — RESP PANEL BY RT-PCR (FLU A&B, COVID) ARPGX2
Influenza A by PCR: NEGATIVE
Influenza B by PCR: NEGATIVE
SARS Coronavirus 2 by RT PCR: NEGATIVE

## 2021-03-19 LAB — CBG MONITORING, ED: Glucose-Capillary: 108 mg/dL — ABNORMAL HIGH (ref 70–99)

## 2021-03-19 LAB — APTT: aPTT: 24 seconds (ref 24–36)

## 2021-03-19 LAB — TROPONIN I (HIGH SENSITIVITY)
Troponin I (High Sensitivity): 25 ng/L — ABNORMAL HIGH (ref <18)
Troponin I (High Sensitivity): 26 ng/L — ABNORMAL HIGH (ref <18)

## 2021-03-19 IMAGING — DX DG CHEST 1V PORT
1 series · 1 of 1 positions shown · non-contrast
Comparison: [DATE]

CLINICAL DATA: Syncope

EXAM:
PORTABLE CHEST 1 VIEW

[chest ap]
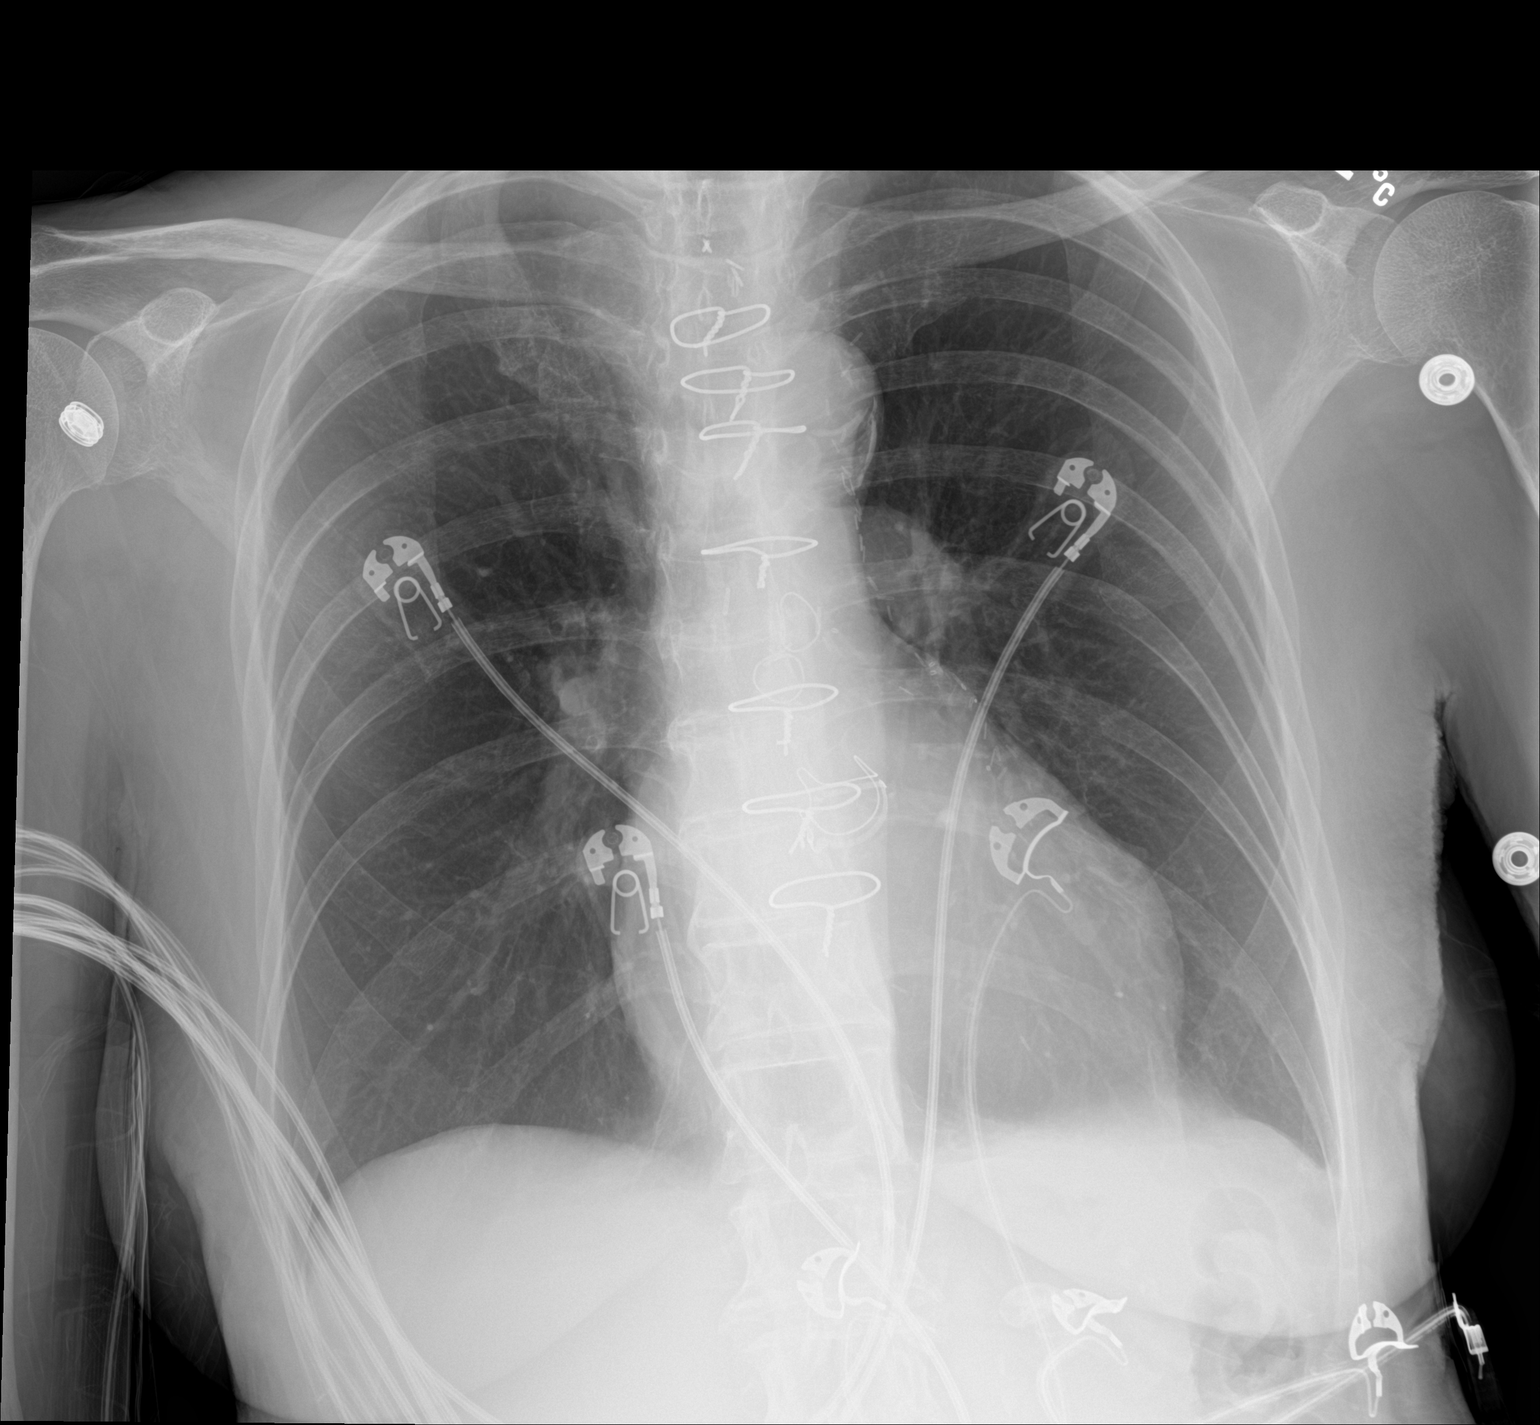

[1 of 1 positions shown; findings below may reference images not displayed]

FINDINGS: Single frontal view of the chest demonstrates postsurgical changes
from CABG and aortic valve replacement. Cardiac silhouette is
stable. No acute airspace disease, effusion, or pneumothorax. No
acute bony abnormalities.
IMPRESSION: 1. No acute intrathoracic process.

## 2021-03-19 IMAGING — CT CT ANGIO CHEST
2 of 6 series · 18 of 36 positions shown · IV contrast (agent unspecified)
Comparison: [DATE]

CLINICAL DATA: Syncope, simple, normal neuro exam

EXAM:
CT ANGIOGRAPHY CHEST WITH CONTRAST
TECHNIQUE: Multidetector CT imaging of the chest was performed using the
standard protocol during bolus administration of intravenous
contrast. Multiplanar CT image reconstructions and MIPs were
obtained to evaluate the vascular anatomy.

[Series 6: thins · axial · 0.66mm/px · z∈[+1389,+1644]mm · 17 of 287 slices shown]
[im 16/287  lung]
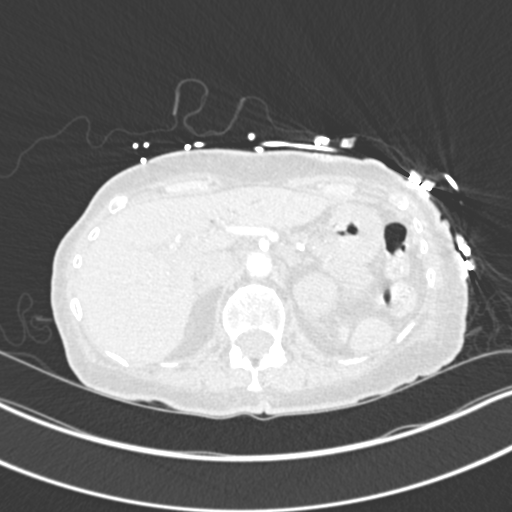
[im 32/287  mediastinal]
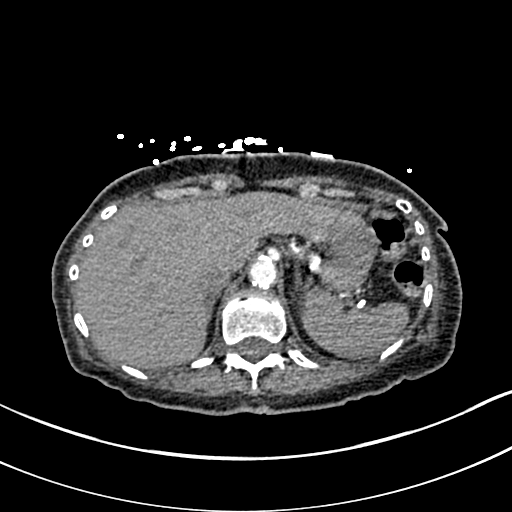
[im 48/287  lung]
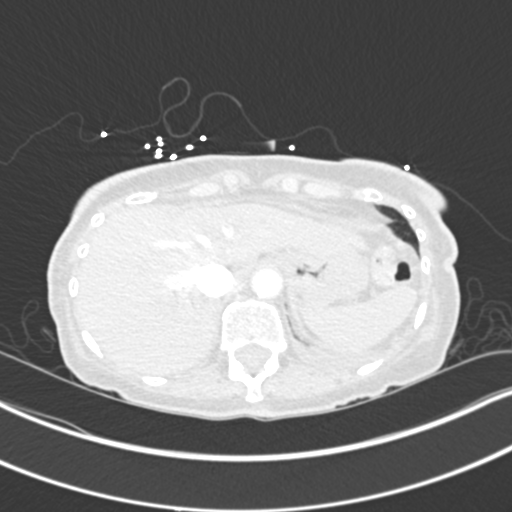
[im 64/287  mediastinal]
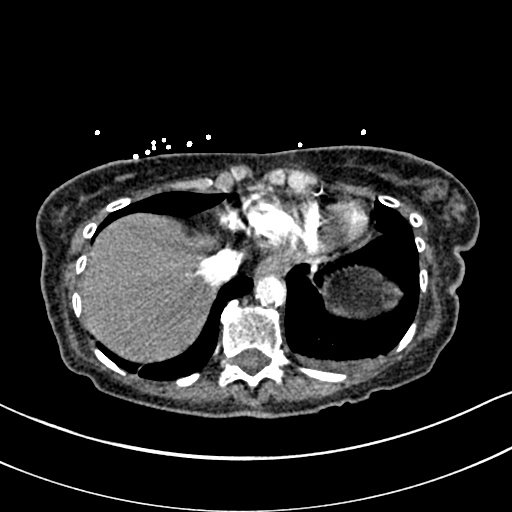
[im 80/287  lung]
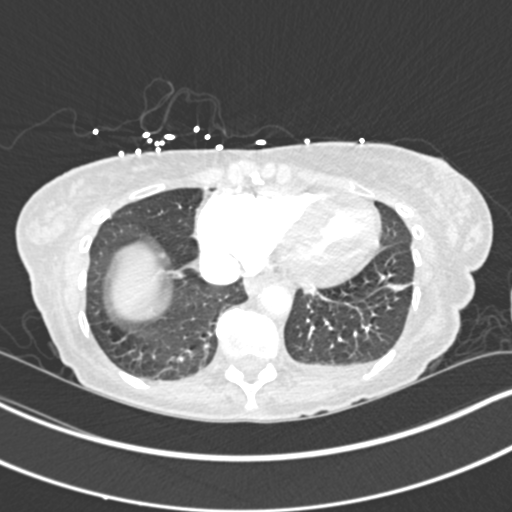
[im 96/287  mediastinal]
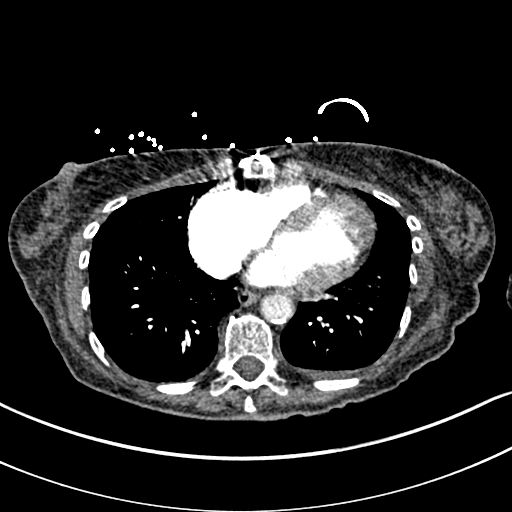
[im 112/287  lung]
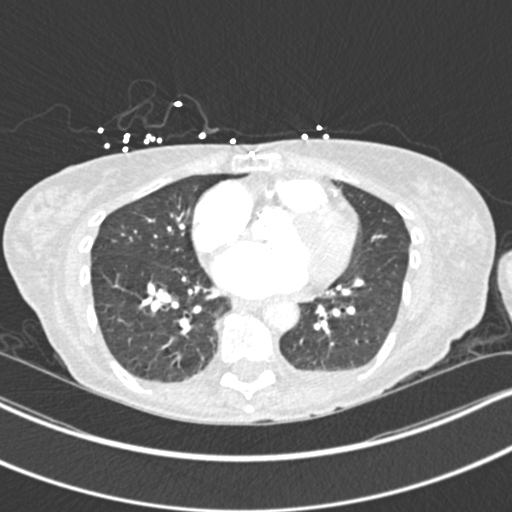
[im 128/287  mediastinal]
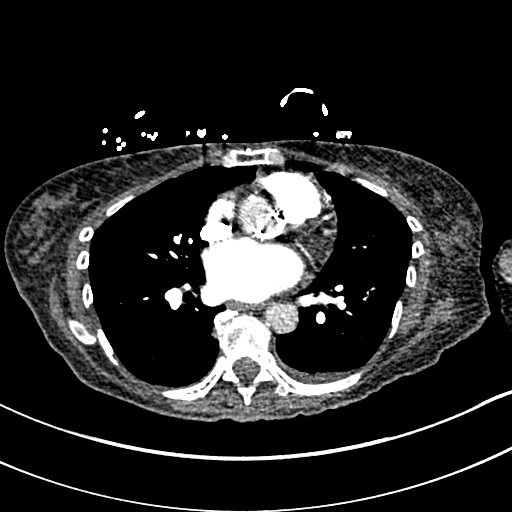
[im 144/287  lung]
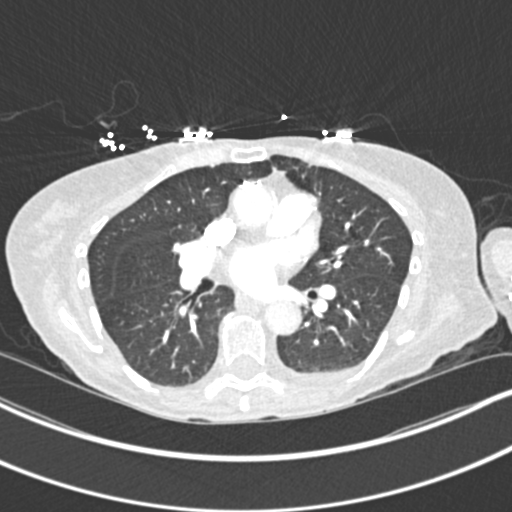
[im 159/287  mediastinal]
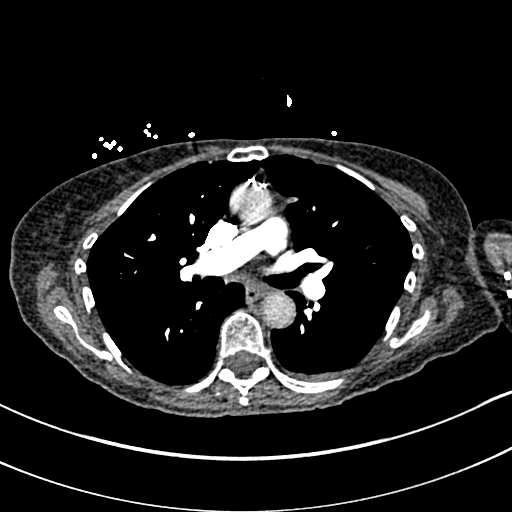
[im 175/287  lung]
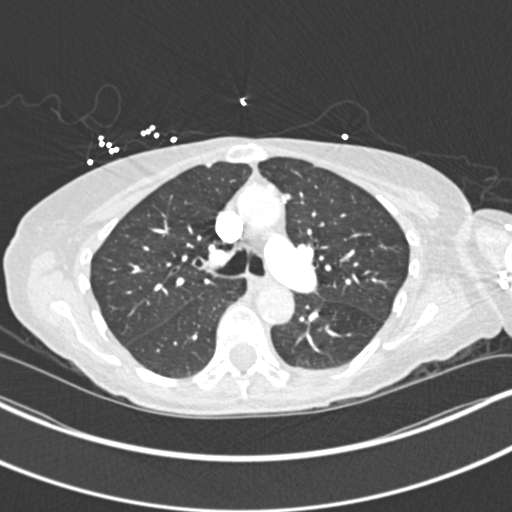
[im 191/287  mediastinal]
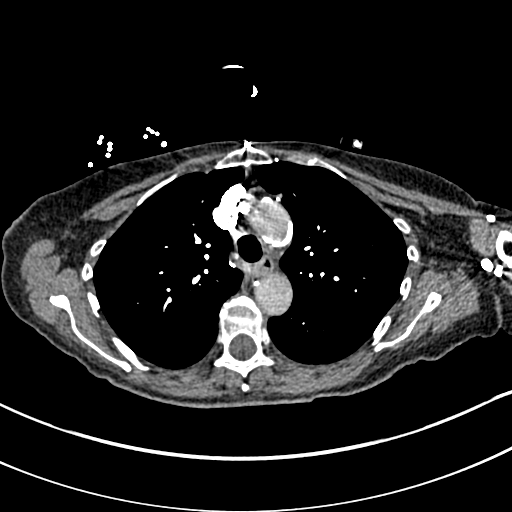
[im 207/287  lung]
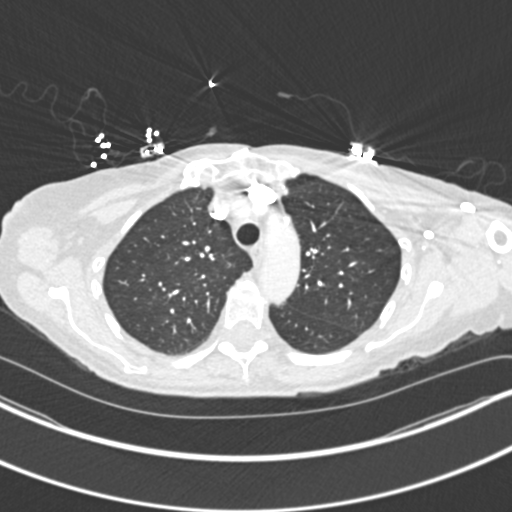
[im 223/287  mediastinal]
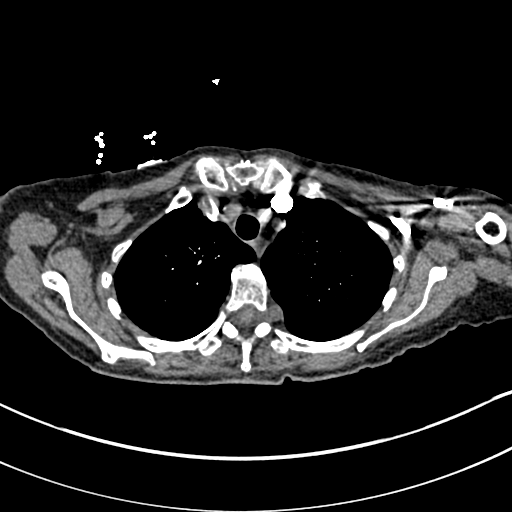
[im 239/287  lung]
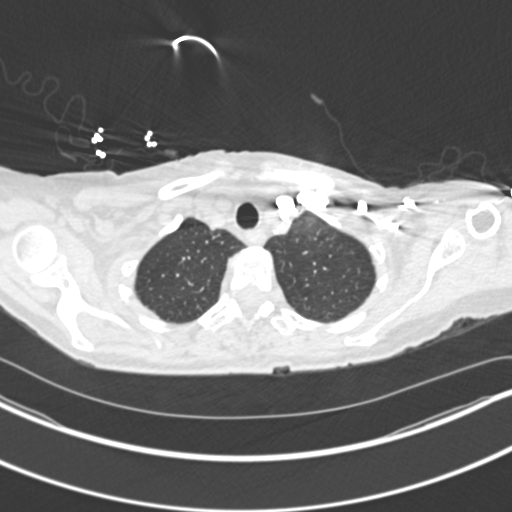
[im 255/287  mediastinal]
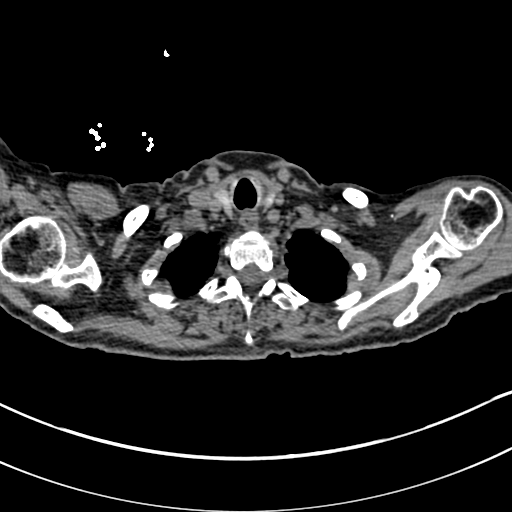
[im 271/287  lung]
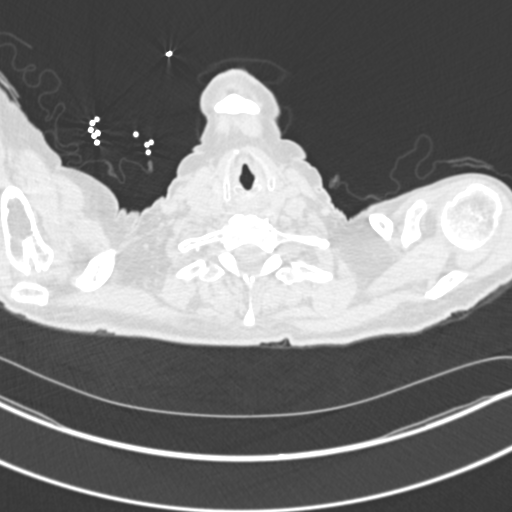

[Series 7: coronal mpr · coronal · 0.63mm/px · 1 of 95 slices shown]
[im 48/95  mediastinal]
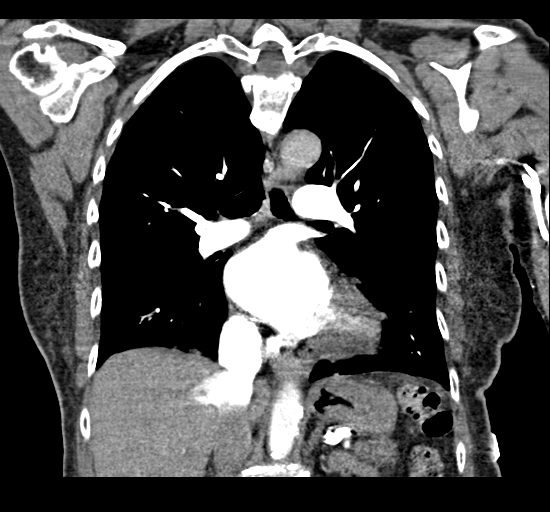

[18 of 36 positions shown; findings below may reference images not displayed]

RADIATION DOSE REDUCTION: This exam was performed according to the
departmental dose-optimization program which includes automated
exposure control, adjustment of the mA and/or kV according to
patient size and/or use of iterative reconstruction technique.

CONTRAST:  75mL OMNIPAQUE IOHEXOL 350 MG/ML SOLN
FINDINGS: Cardiovascular: No filling defects in the pulmonary arteries to
suggest pulmonary emboli. Mild cardiomegaly. Prior aortic valve
repair and CABG. Moderate aortic calcifications. No aneurysm.

Mediastinum/Nodes: No mediastinal, hilar, or axillary adenopathy.
Trachea and esophagus are unremarkable. Thyroid unremarkable.

Lungs/Pleura: Trace left pleural effusion. No confluent opacities.
No suspicious nodules.

Upper Abdomen: Imaging into the upper abdomen demonstrates no acute
findings.

Musculoskeletal: Chest wall soft tissues are unremarkable. No acute
bony abnormality.

Review of the MIP images confirms the above findings.
IMPRESSION: No evidence of pulmonary embolus.

Cardiomegaly, prior CABG.

Trace left pleural effusion.

Aortic Atherosclerosis ([AJ]-[AJ]).

## 2021-03-19 MED ORDER — IOHEXOL 350 MG/ML SOLN
75.0000 mL | Freq: Once | INTRAVENOUS | Status: AC | PRN
  Administered 2021-03-19: 75 mL via INTRAVENOUS

## 2021-03-19 MED ORDER — ASPIRIN EC 81 MG PO TBEC
81.0000 mg | DELAYED_RELEASE_TABLET | Freq: Every day | ORAL | Status: DC
  Administered 2021-03-19 – 2021-03-21 (×3): 81 mg via ORAL
  Filled 2021-03-19 (×3): qty 1

## 2021-03-19 MED ORDER — HEPARIN (PORCINE) 25000 UT/250ML-% IV SOLN
600.0000 [IU]/h | INTRAVENOUS | Status: DC
  Administered 2021-03-19: 600 [IU]/h via INTRAVENOUS

## 2021-03-19 MED ORDER — DILTIAZEM LOAD VIA INFUSION
10.0000 mg | Freq: Once | INTRAVENOUS | Status: DC | PRN
  Filled 2021-03-19: qty 10

## 2021-03-19 MED ORDER — SODIUM CHLORIDE (PF) 0.9 % IJ SOLN
INTRAMUSCULAR | Status: AC
  Filled 2021-03-19: qty 50

## 2021-03-19 MED ORDER — SODIUM CHLORIDE 0.9 % IV BOLUS
500.0000 mL | Freq: Once | INTRAVENOUS | Status: AC
  Administered 2021-03-19: 500 mL via INTRAVENOUS

## 2021-03-19 MED ORDER — ONDANSETRON HCL 4 MG PO TABS: 4.0000 mg | ORAL_TABLET | Freq: Four times a day (QID) | ORAL | Status: DC | PRN

## 2021-03-19 MED ORDER — DILTIAZEM HCL-DEXTROSE 125-5 MG/125ML-% IV SOLN (PREMIX)
5.0000 mg/h | INTRAVENOUS | Status: DC
  Administered 2021-03-19: 5 mg/h via INTRAVENOUS
  Administered 2021-03-19: 12.5 mg/h via INTRAVENOUS
  Filled 2021-03-19 (×2): qty 125

## 2021-03-19 MED ORDER — ONDANSETRON HCL 4 MG/2ML IJ SOLN: 4.0000 mg | Freq: Four times a day (QID) | INTRAMUSCULAR | Status: DC | PRN

## 2021-03-19 MED ORDER — METOPROLOL SUCCINATE ER 50 MG PO TB24
50.0000 mg | ORAL_TABLET | Freq: Every day | ORAL | Status: DC
  Administered 2021-03-19 – 2021-03-20 (×2): 50 mg via ORAL
  Filled 2021-03-19 (×2): qty 1

## 2021-03-19 MED ORDER — ATORVASTATIN CALCIUM 10 MG PO TABS
10.0000 mg | ORAL_TABLET | Freq: Every day | ORAL | Status: DC
  Administered 2021-03-19 – 2021-03-20 (×2): 10 mg via ORAL
  Filled 2021-03-19 (×2): qty 1

## 2021-03-19 MED ORDER — HEPARIN BOLUS VIA INFUSION
2000.0000 [IU] | Freq: Once | INTRAVENOUS | Status: AC
  Administered 2021-03-19: 2000 [IU] via INTRAVENOUS
  Filled 2021-03-19: qty 2000

## 2021-03-19 MED ORDER — ACETAMINOPHEN 650 MG RE SUPP
650.0000 mg | Freq: Four times a day (QID) | RECTAL | Status: DC | PRN
Start: 2021-03-19 — End: 2021-03-21

## 2021-03-19 MED ORDER — ACALABRUTINIB MALEATE 100 MG PO TABS: 100.0000 mg | ORAL_TABLET | Freq: Two times a day (BID) | ORAL | Status: DC

## 2021-03-19 MED ORDER — OXYBUTYNIN CHLORIDE ER 5 MG PO TB24
5.0000 mg | ORAL_TABLET | Freq: Every day | ORAL | Status: DC
  Administered 2021-03-19 – 2021-03-20 (×2): 5 mg via ORAL
  Filled 2021-03-19 (×2): qty 1

## 2021-03-19 MED ORDER — SODIUM CHLORIDE 0.9 % IV BOLUS
1000.0000 mL | Freq: Once | INTRAVENOUS | Status: AC
  Administered 2021-03-19: 1000 mL via INTRAVENOUS

## 2021-03-19 MED ORDER — DILTIAZEM HCL-DEXTROSE 125-5 MG/125ML-% IV SOLN (PREMIX)
2.5000 mg/h | INTRAVENOUS | Status: DC
  Administered 2021-03-20: 2.5 mg/h via INTRAVENOUS
  Filled 2021-03-19: qty 125

## 2021-03-19 MED ORDER — SODIUM CHLORIDE 0.9 % IV SOLN: INTRAVENOUS | Status: AC

## 2021-03-19 MED ORDER — POLYETHYLENE GLYCOL 3350 17 G PO PACK: 17.0000 g | PACK | Freq: Every day | ORAL | Status: DC | PRN

## 2021-03-19 MED ORDER — ACETAMINOPHEN 325 MG PO TABS: 650.0000 mg | ORAL_TABLET | Freq: Four times a day (QID) | ORAL | Status: DC | PRN

## 2021-03-19 NOTE — ED Triage Notes (Signed)
Patient reports that she had 2 syncopal episodes today and they occurred when she stood up. Patient states that she landed on carpet and does not feel like she hit her head. ? ?

## 2021-03-19 NOTE — Progress Notes (Signed)
ANTICOAGULATION CONSULT NOTE - Initial Consult ? ?Pharmacy Consult for Heparin ?Indication: atrial fibrillation ? ?No Known Allergies ? ?Patient Measurements: ?Height: 5' (152.4 cm) ?Weight: 41.7 kg (92 lb) ?IBW/kg (Calculated) : 45.5 ?Heparin Dosing Weight: TBW ? ?Vital Signs: ?Temp: 97.4 ?F (36.3 ?C) (03/14 1452) ?Temp Source: Oral (03/14 1452) ?BP: 143/90 (03/14 1600) ?Pulse Rate: 101 (03/14 1600) ? ?Labs: ?Recent Labs  ?  03/19/21 ?1459 03/19/21 ?1505  ?HGB 14.3  --   ?HCT 41.8  --   ?PLT 226  --   ?CREATININE  --  0.93  ?TROPONINIHS  --  25*  ? ? ?Estimated Creatinine Clearance: 31.8 mL/min (by C-G formula based on SCr of 0.93 mg/dL). ? ? ?Medical History: ?Past Medical History:  ?Diagnosis Date  ? Aortic stenosis   ? a. Echocardiogram (01/11/14):  Mild LVH, EF 60-65%, Gr 1 DD, possible bicuspid AV, severe AS (mean 61 mmHg, peak 104 mmHg), mild MVP of post leaflet, mild MR, PASP 33 mmHg.;  b. 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 Arthritis   ? Atrial fibrillation ([place])   ? post op after CABG+AVR >> Amiodarone  ? CAD (coronary artery disease)   ? a. LHC (01/13/14):  dLM 70 extending into oLAD, mLAD 40-50, pD1 80, pD2 70, ostial/prox OM1 70 >> CABG (L-LAD, S-OM1, S-D1, S-D2)  // Myoview 10/22: Fixed defect anterolateral wall consistent with artifact, EF 71, no ischemia, low risk  ? Carotid artery occlusion   ? a. s/p L CEA 2013;  b.  Carotid US (1/16):  Bilateral ICA 1-39% // Carotid US 10/22: Bilateral ICA 1-39  ? CLL (chronic lymphocytic leukemia) ([place])   ? slow leukemia---Dr  Murinson  ? H/O exercise stress test   ? NSSTT  ? Hx of cardiovascular stress test   ? a. Nuclear stress test That (4/13): Normal perfusion, EF 74%  ? Hx of echocardiogram   ? Echo (2/16):  Mild LVH, EF 60-65%, no RWMA, Gr 2 DD, AVR ok (mean 9 mmHg), trivial AI, mild MR, mild LAE, PASP 40 mmHg  ? Hypertension   ? PAD (peripheral artery disease) ([place])   ? LE Arterial US 10/22: R - pCFA, oSFA, dSFA 30-49; L - pCFA + mSFA 50-74, pPop 30-49 >> referred  to [facility]  ? Pancreatitis   ? h/o  ? Thrombocytopenia ([place]) 11/19/2010  ? ? ?Medications:  ?Scheduled:  ?Infusions:  ? sodium chloride 75 mL/hr at 03/19/21 1654  ? diltiazem (CARDIZEM) infusion 5 mg/hr (03/19/21 1534)  ? ? ?Assessment: ?Pharmacy is consulted to dose heparin for Afib in this 55 yoF who presented to ED from home with syncope.  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 ?No known prior to admission anticoagulation.  Baseline coags pending.  ?CBC: Hgb and Plt wnl ?SCr 0.93 ? ?Goal of Therapy:  ?Heparin level 0.3-0.7 units/ml ?Monitor platelets by anticoagulation protocol: Yes ?  ?Plan:  ?Give heparin 2000 units bolus IV x 1 ?Start heparin IV infusion at 600 units/hr ?Heparin level 8 hours after starting ?Daily heparin level and CBC ? ? ?Gretta Arab PharmD, BCPS ?Clinical Pharmacist ?[facility] [phone] ?03/19/2021 5:20 PM ?

## 2021-03-19 NOTE — H&P (Signed)
? ? ?History and Physical ? ?[name] OMV:[id number] DOB: 04/18/1940 DOA: 03/19/2021 ? ?PCP: Kristie Cowman, MD ?Patient coming from: Home ? ?I have personally briefly reviewed patient's old medical records in [place] ? ? ?Chief Complaint: Syncope ? ?HPI: Nicole Mercado is a 81 y.o. female past medical history of of aortic stenosis status post aortic valve replacement on 01/17/2014, CAD STEMI in 2016 underwent CABG during that time, carotid artery stenosis status post left carotid endarterectomy, essential hypertension and CLL into the hospital for 2 syncopal episodes that happened the day of admission, the first when she was standing up she felt lightheaded but did not fall completely.  Shortly thereafter she attempted to stand up and passed out completely unconscious to the floor did not hit her head only lost consciousness for several seconds, she denies any nausea vomiting fever upper respiratory tract infections.  The patient relates she drove back and forth to from [place] to [facility] back and forth on the same day without stopping and not keeping herself hydrated. ? ?In the ED: ?She was found to be hyponatremic hypokalemic, with a mild leukocytosis, SARS-CoV-2 influenza PCR were negative, chest x-ray showed no infiltrates twelve-lead EKG showed A-fib with RVR ? ? ?Review of Systems: All systems reviewed and apart from history of presenting illness, are negative. ? ?Past Medical History:  ?Diagnosis Date  ? Aortic stenosis   ? a. Echocardiogram (01/11/14):  Mild LVH, EF 60-65%, Gr 1 DD, possible bicuspid AV, severe AS (mean 61 mmHg, peak 104 mmHg), mild MVP of post leaflet, mild MR, PASP 33 mmHg.;  b. 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 Arthritis   ? Atrial fibrillation ([place])   ? post op after CABG+AVR >> Amiodarone  ? CAD (coronary artery disease)   ? a. LHC (01/13/14):  dLM 70 extending into oLAD, mLAD 40-50, pD1 80, pD2 70, ostial/prox OM1 70 >> CABG (L-LAD, S-OM1, S-D1, S-D2)  // Myoview  10/22: Fixed defect anterolateral wall consistent with artifact, EF 71, no ischemia, low risk  ? Carotid artery occlusion   ? a. s/p L CEA 2013;  b.  Carotid US (1/16):  Bilateral ICA 1-39% // Carotid US 10/22: Bilateral ICA 1-39  ? CLL (chronic lymphocytic leukemia) ([place])   ? slow leukemia---Dr  Murinson  ? H/O exercise stress test   ? NSSTT  ? Hx of cardiovascular stress test   ? a. Nuclear stress test That (4/13): Normal perfusion, EF 74%  ? Hx of echocardiogram   ? Echo (2/16):  Mild LVH, EF 60-65%, no RWMA, Gr 2 DD, AVR ok (mean 9 mmHg), trivial AI, mild MR, mild LAE, PASP 40 mmHg  ? Hypertension   ? PAD (peripheral artery disease) ([place])   ? LE Arterial US 10/22: R - pCFA, oSFA, dSFA 30-49; L - pCFA + mSFA 50-74, pPop 30-49 >> referred to [facility]  ? Pancreatitis   ? h/o  ? Thrombocytopenia ([place]) 11/19/2010  ? ?Past Surgical History:  ?Procedure Laterality Date  ? ABDOMINAL HYSTERECTOMY    ? AORTIC VALVE REPLACEMENT [name] 01/17/2014  ? Procedure: AORTIC VALVE REPLACEMENT (AVR);  Surgeon: Gaye Pollack, MD;  Location: [facility];  Service: Open Heart Surgery;  Laterality: [name];  ? APPENDECTOMY    ? CAROTID ENDARTERECTOMY  06/09/11  ? LEFT  cea  ? CESAREAN SECTION    ? FIVE  ? CHOLECYSTECTOMY    ? CORONARY ARTERY BYPASS GRAFT [name] 01/17/2014  ? Procedure: CORONARY ARTERY BYPASS GRAFTING (CABG)TIMES  FOUR USING LEFT INTERNAL MAMMARY ARTERY AND RIGHT SAPHENOUS VEIN HARVESTED ENDOSCOPICALLY;  Surgeon: Gaye Pollack, MD;  Location: [facility] OR;  Service: Open Heart Surgery;  Laterality: [name];  ? ENDARTERECTOMY  06/09/2011  ? Procedure: ENDARTERECTOMY CAROTID;  Surgeon: Rosetta Posner, MD;  Location[facility];  Service: Vascular;  Laterality: Left;  left carotid endarterectomy with patch angioplasty  ? LEFT AND RIGHT HEART CATHETERIZATION WITH CORONARY ANGIOGRAM [name] 01/13/2014  ? Procedure: LEFT AND RIGHT HEART CATHETERIZATION WITH CORONARY ANGIOGRAM;  Surgeon: Jettie Booze, MD;  Location: [facility] CATH LAB;  Service: Cardiovascular;  Laterality:  [name];  ? TEE WITHOUT CARDIOVERSION [name] 01/17/2014  ? Procedure: TRANSESOPHAGEAL ECHOCARDIOGRAM (TEE);  Surgeon: Gaye Pollack, MD;  Location: [facility];  Service: Open Heart Surgery;  Laterality: [name];  ? TONSILLECTOMY    ? ?Social History:  reports that she has never smoked. She has never used smokeless tobacco. She reports that she does not drink alcohol and does not use drugs. ? ? ?No Known Allergies ? ?Family History  ?Problem Relation Age of Onset  ? Heart disease Mother   ?     in her 28s  ? Hypertension Mother   ? Heart attack Mother   ? Heart disease Father   ? Hypertension Father   ? Hypertension Sister   ? Hypertension Son   ? Stroke Paternal Grandmother   ? Stroke Paternal Grandfather   ? ?Prior to Admission medications   ?Medication Sig Start Date End Date Taking? Authorizing Provider  ?Acalabrutinib Maleate (CALQUENCE) 100 MG TABS Take 1 tablet (100 mg) by mouth 2 (two) times daily. 02/06/21   Heath Lark, MD  ?acetaminophen (TYLENOL) 325 MG tablet Take 650 mg by mouth every 6 (six) hours as needed for mild pain or headache.     [provider]  ?amLODipine (NORVASC) 5 MG tablet Take 1.5 tablets (7.5 mg total) by mouth daily. Pt needs to keep upcoming appt in Mar for further refills 03/08/21   Richardson Dopp T, PA-C  ?amoxicillin (AMOXIL) 500 MG capsule TAKE FOUR CAPSULES BY MOUTH ONE HOUR BEFORE APPOINTMENT 07/30/20   Sherren Mocha, MD  ?aspirin 81 MG tablet Take 81 mg by mouth daily.    [provider]  ?atorvastatin (LIPITOR) 10 MG tablet TAKE 1 TABLET BY MOUTH ONCE DAILY AT  6  IN  THE  EVENING 12/28/20   Sherren Mocha, MD  ?Calcium Carb-Cholecalciferol 500-125 MG-UNIT TABS Take 1 tablet by mouth daily.    [provider]  ?metoprolol succinate (TOPROL-XL) 50 MG 24 hr tablet Take 1 tablet by mouth once daily 02/14/21   Sherren Mocha, MD  ?Multiple Vitamins-Minerals (HM MULTIVITAMIN ADULT GUMMY PO) Chew two gummies daily    [provider]  ?oxybutynin (DITROPAN-XL) 5 MG  24 hr tablet Take 5 mg by mouth at bedtime.    [provider]  ? ?Physical Exam: ?Vitals:  ? 03/19/21 1452 03/19/21 1545 03/19/21 1600  ?BP: 103/68 (!) 170/70 (!) 143/90  ?Pulse: 97 87 (!) 101  ?Resp: 16 (!) 25 20  ?Temp: (!) 97.4 ?F (36.3 ?C)    ?TempSrc: Oral    ?SpO2: 95% 100% 100%  ?Weight: 41.7 kg    ?Height: 5' (1.524 m)    ? ? ?General exam: Moderately built and nourished patient, lying comfortably supine on the gurney in no obvious distress. ?Head, eyes and ENT: Nontraumatic and normocephalic. Pupils equally reacting to light and accommodation. Oral mucosa moist. ?Neck: Supple. No JVD, carotid bruit or  thyromegaly. ?Lymphatics: No lymphadenopathy. ?Respiratory system: Clear to auscultation. No increased work of breathing. ?Cardiovascular system: S1 and S2 heard, RRR. No JVD, murmurs, gallops, clicks or pedal edema. ?Gastrointestinal system: Abdomen is nondistended, soft and nontender. Normal bowel sounds heard. No organomegaly or masses appreciated. ?Extremities: Symmetric 5 x 5 power. Peripheral pulses symmetrically felt.  ?Skin: No rashes or acute findings. ?Musculoskeletal system: Negative exam. ?Psychiatry: Pleasant and cooperative. ? ? ?Labs on Admission:  ?Basic Metabolic Panel: ?Recent Labs  ?Lab 03/19/21 ?1505  ?NA 132*  ?K 3.3*  ?CL 99  ?CO2 25  ?GLUCOSE 119*  ?BUN 28*  ?CREATININE 0.93  ?CALCIUM 9.0  ? ?Liver Function Tests: ?Recent Labs  ?Lab 03/19/21 ?1505  ?AST 24  ?ALT 27  ?ALKPHOS 72  ?BILITOT 0.6  ?PROT 5.8*  ?ALBUMIN 3.3*  ? ?No results for input(s): LIPASE, AMYLASE in the last 168 hours. ?No results for input(s): AMMONIA in the last 168 hours. ?CBC: ?Recent Labs  ?Lab 03/19/21 ?1459  ?WBC 12.1*  ?HGB 14.3  ?HCT 41.8  ?MCV 96.5  ?PLT 226  ? ?Cardiac Enzymes: ?No results for input(s): CKTOTAL, CKMB, CKMBINDEX, TROPONINI in the last 168 hours. ? ?BNP (last 3 results) ?No results for input(s): PROBNP in the last 8760 hours. ?CBG: ?Recent Labs  ?Lab 03/19/21 ?1601  ?GLUCAP 108*   ? ? ?Radiological Exams on Admission: ?DG Chest Port 1 View ? ?Result Date: 03/19/2021 ?CLINICAL DATA:  Syncope EXAM: PORTABLE CHEST 1 VIEW COMPARISON:  08/18/2020 FINDINGS: Single frontal view of the chest demons

## 2021-03-19 NOTE — ED Provider Notes (Signed)
?[facility] DEPT ?Provider Note ? ? ?CSN: [id number] ?Arrival date & time: 03/19/21  1446 ? ?  ? ?History ? ?Chief Complaint  ?Patient presents with  ? Loss of Consciousness  ? ? ?Nicole Mercado is a 81 y.o. female. ? ?81 year old female with prior medical history as detailed below presents for evaluation.  Patient reports 2 syncopal episodes that occurred this morning.  Patient reports that she felt fine yesterday.  She does admit that she drove to [place] and back with minimal p.o. fluid intake yesterday. ? ?This morning she woke up and she felt lightheaded with standing.  She did not completely pass out but almost did so.  Shortly thereafter she attempted standing again and passed out completely.  She believes that she was unconscious for several seconds.  She denies injury related to passing out.  She denies headache.  She denies vision change.  She denies weakness.  She denies a sensation of palpitations.  She denies chest pain or shortness of breath. ? ?Patient denies prior history of A-fib. ? ?Chart review does reveal evidence of paroxysmal A-fib in the past.  Patient was felt to be high risk for anticoagulation.  Patient is followed by Dr. Burt Knack with cardiology. ? ? ? ?The history is provided by the patient and medical records.  ?Loss of Consciousness ?Most recent episode:  Today ?Duration:  60 seconds ?Timing:  Sporadic ?Progression:  Waxing and waning ?Chronicity:  New ? ?  ? ?Home Medications ?Prior to Admission medications   ?Medication Sig Start Date End Date Taking? Authorizing Provider  ?Acalabrutinib Maleate (CALQUENCE) 100 MG TABS Take 1 tablet (100 mg) by mouth 2 (two) times daily. 02/06/21   Heath Lark, MD  ?acetaminophen (TYLENOL) 325 MG tablet Take 650 mg by mouth every 6 (six) hours as needed for mild pain or headache.     [provider]  ?amLODipine (NORVASC) 5 MG tablet Take 1.5 tablets (7.5 mg total) by mouth daily. Pt needs to keep upcoming appt  in Mar for further refills 03/08/21   Richardson Dopp T, PA-C  ?amoxicillin (AMOXIL) 500 MG capsule TAKE FOUR CAPSULES BY MOUTH ONE HOUR BEFORE APPOINTMENT 07/30/20   Sherren Mocha, MD  ?aspirin 81 MG tablet Take 81 mg by mouth daily.    [provider]  ?atorvastatin (LIPITOR) 10 MG tablet TAKE 1 TABLET BY MOUTH ONCE DAILY AT  6  IN  THE  EVENING 12/28/20   Sherren Mocha, MD  ?Calcium Carb-Cholecalciferol 500-125 MG-UNIT TABS Take 1 tablet by mouth daily.    [provider]  ?metoprolol succinate (TOPROL-XL) 50 MG 24 hr tablet Take 1 tablet by mouth once daily 02/14/21   Sherren Mocha, MD  ?Multiple Vitamins-Minerals (HM MULTIVITAMIN ADULT GUMMY PO) Chew two gummies daily    [provider]  ?oxybutynin (DITROPAN-XL) 5 MG 24 hr tablet Take 5 mg by mouth at bedtime.    [provider]  ?   ? ?Allergies    ?Patient has no known allergies.   ? ?Review of Systems   ?Review of Systems  ?Cardiovascular:  Positive for syncope.  ?All other systems reviewed and are negative. ? ?Physical Exam ?Updated Vital Signs ?BP 103/68   Pulse 97   Temp (!) 97.4 ?F (36.3 ?C) (Oral)   Resp 16   Ht 5' (1.524 m)   Wt 41.7 kg   SpO2 95%   BMI 17.97 kg/m?  ?Physical Exam ?Vitals and nursing note reviewed.  ?Constitutional:   ?  General: She is not in acute distress. ?   Appearance: Normal appearance. She is well-developed.  ?HENT:  ?   Head: Normocephalic and atraumatic.  ?Eyes:  ?   Conjunctiva/sclera: Conjunctivae normal.  ?   Pupils: Pupils are equal, round, and reactive to light.  ?Cardiovascular:  ?   Rate and Rhythm: Tachycardia present. Rhythm irregular.  ?   Heart sounds: Normal heart sounds.  ?Pulmonary:  ?   Effort: Pulmonary effort is normal. No respiratory distress.  ?   Breath sounds: Normal breath sounds.  ?Abdominal:  ?   General: There is no distension.  ?   Palpations: Abdomen is soft.  ?   Tenderness: There is no abdominal tenderness.  ?Musculoskeletal:     ?   General: No  deformity. Normal range of motion.  ?   Cervical back: Normal range of motion and neck supple.  ?Skin: ?   General: Skin is warm and dry.  ?Neurological:  ?   General: No focal deficit present.  ?   Mental Status: She is alert and oriented to person, place, and time.  ? ? ?ED Results / Procedures / Treatments   ?Labs ?(all labs ordered are listed, but only abnormal results are displayed) ?Labs Reviewed  ?CBC - Abnormal; Notable for the following components:  ?    Result Value  ? WBC 12.1 (*)   ? All other components within normal limits  ?RESP PANEL BY RT-PCR (FLU A&B, COVID) [id number]  ?URINALYSIS, ROUTINE W REFLEX MICROSCOPIC  ?COMPREHENSIVE METABOLIC PANEL  ?CBG MONITORING, ED  ?TROPONIN I (HIGH SENSITIVITY)  ? ? ?EKG ?EKG Interpretation ? ?Date/Time:  Tuesday March 19 2021 15:00:24 EDT ?Ventricular Rate:  147 ?PR Interval:    ?QRS Duration: 83 ?QT Interval:  285 ?QTC Calculation: 446 ?R Axis:   -29 ?Text Interpretation: Atrial fibrillation with rapid V-rate Borderline left axis deviation Probable anteroseptal infarct, recent Confirmed by Dene Gentry [phone]) on 03/19/2021 3:23:34 PM ? ?Radiology ?No results found. ? ?Procedures ?Procedures  ? ? ?Medications Ordered in ED ?Medications  ?diltiazem (CARDIZEM) 125 mg in dextrose 5% 125 mL (1 mg/mL) infusion (5 mg/hr Intravenous New Bag/Given 03/19/21 1534)  ?sodium chloride 0.9 % bolus 500 mL (500 mLs Intravenous New Bag/Given 03/19/21 1533)  ? ? ?ED Course/ Medical Decision Making/ A&P ?  ?                        ?Medical Decision Making ?Amount and/or Complexity of Data Reviewed ?Labs: ordered. ?Radiology: ordered. ? ?Risk ?Prescription drug management. ?Decision regarding hospitalization. ? ? ? ?Medical Screen Complete ? ?This patient presented to the ED with complaint of syncope/ near - syncope. ? ?This complaint involves an extensive number of treatment options. The initial differential diagnosis includes, but is not limited to, [place] abnormality, A-fib with RVR,  ACS, etc. ? ?This presentation is: Acute, Chronic, Self-Limited, Previously Undiagnosed, Uncertain Prognosis, Complicated, Systemic Symptoms, and Threat to Life/Bodily Function ? ?Patient is presenting with complaint of near syncope and syncopal event that occurred earlier today. ? ?She admits to decreased p.o. intake yesterday  ? ?Patient is found to be in A-fib with RVR upon arrival.  She is not anticoagulated. ? ?She is known to Dr. Burt Knack with cardiology. ? ?Patient with minimal prior history of A-fib per cardiology note. ? ?Patient is comfortable upon evaluation here. ? ?Cardizem drip initiated with rate control. ? ?Screening labs obtained.  Case discussed with cardiology and they will evaluate as  consult. ? ?Hospitalist services were case and will evaluate for admission. ? ?Additional history obtained: ? ?Additional history obtained from Spouse ?External records from outside sources obtained and reviewed including prior ED visits and prior Inpatient records.  ? ? ?Lab Tests: ? ?I ordered and personally interpreted labs.  The pertinent results include: CBC, CMP, troponin, flu, COVID ? ? ?Imaging Studies ordered: ? ?I ordered imaging studies including chest x-ray ?I independently visualized and interpreted obtained imaging which showed NAD ?I agree with the radiologist interpretation. ? ? ?Cardiac Monitoring: ? ?The patient was maintained on a cardiac monitor.  I personally viewed and interpreted the cardiac monitor which showed an underlying rhythm of: A-fib with RVR ? ? ?Medicines ordered: ? ?I ordered medication including Cardizem drip for A-fib with RVR ?Reevaluation of the patient after these medicines showed that the patient: improved ? ? ?Consultations Obtained: ? ?I consulted cardiology,  and discussed lab and imaging findings as well as pertinent plan of care.  ? ? ?Problem List / ED Course: ? ?Syncope, A-fib with RVR ? ? ?Reevaluation: ? ?After the interventions noted above, I reevaluated the patient  and found that they have: improved ? ?Disposition: ? ?After consideration of the diagnostic results and the patients response to treatment, I feel that the patent would benefit from admission.  ? ?CRITICAL CARE ?Perf

## 2021-03-20 ENCOUNTER — Inpatient Hospital Stay (HOSPITAL_COMMUNITY): Payer: Medicare Other

## 2021-03-20 DIAGNOSIS — I4891 Unspecified atrial fibrillation: Secondary | ICD-10-CM

## 2021-03-20 DIAGNOSIS — R55 Syncope and collapse: Secondary | ICD-10-CM

## 2021-03-20 LAB — CBC
HCT: 35.5 % — ABNORMAL LOW (ref 36.0–46.0)
Hemoglobin: 12 g/dL (ref 12.0–15.0)
MCH: 32.9 pg (ref 26.0–34.0)
MCHC: 33.8 g/dL (ref 30.0–36.0)
MCV: 97.3 fL (ref 80.0–100.0)
Platelets: 201 10*3/uL (ref 150–400)
RBC: 3.65 MIL/uL — ABNORMAL LOW (ref 3.87–5.11)
RDW: 12.2 % (ref 11.5–15.5)
WBC: 9 10*3/uL (ref 4.0–10.5)
nRBC: 0 % (ref 0.0–0.2)

## 2021-03-20 LAB — BASIC METABOLIC PANEL
Anion gap: 9 (ref 5–15)
BUN: 20 mg/dL (ref 8–23)
CO2: 23 mmol/L (ref 22–32)
Calcium: 8.1 mg/dL — ABNORMAL LOW (ref 8.9–10.3)
Chloride: 106 mmol/L (ref 98–111)
Creatinine, Ser: 0.72 mg/dL (ref 0.44–1.00)
GFR, Estimated: >60 mL/min (ref >60)
Glucose, Bld: 111 mg/dL — ABNORMAL HIGH (ref 70–99)
Potassium: 3.1 mmol/L — ABNORMAL LOW (ref 3.5–5.1)
Sodium: 138 mmol/L (ref 135–145)

## 2021-03-20 LAB — ECHOCARDIOGRAM COMPLETE
AV Mean grad: 7.5 mmHg
AV Peak grad: 13.5 mmHg
Ao pk vel: 1.84 m/s
Calc EF: 46.5 %
Height: 60 in
MV M vel: 5.26 m/s
MV Peak grad: 110.5 mmHg
Single Plane A2C EF: 41.8 %
Single Plane A4C EF: 46.6 %
Weight: 1534.4 oz

## 2021-03-20 LAB — TSH: TSH: 1.007 u[IU]/mL (ref 0.350–4.500)

## 2021-03-20 LAB — PROTIME-INR
INR: 1.1 (ref 0.8–1.2)
Prothrombin Time: 14.4 seconds (ref 11.4–15.2)

## 2021-03-20 LAB — HEPARIN LEVEL (UNFRACTIONATED)
Heparin Unfractionated: 0.43 IU/mL (ref 0.30–0.70)
Heparin Unfractionated: 0.3 IU/mL (ref 0.30–0.70)

## 2021-03-20 LAB — POTASSIUM: Potassium: 3.4 mmol/L — ABNORMAL LOW (ref 3.5–5.1)

## 2021-03-20 LAB — MAGNESIUM
Magnesium: 1.5 mg/dL — ABNORMAL LOW (ref 1.7–2.4)
Magnesium: 1.5 mg/dL — ABNORMAL LOW (ref 1.7–2.4)

## 2021-03-20 LAB — PHOSPHORUS: Phosphorus: 1.9 mg/dL — ABNORMAL LOW (ref 2.5–4.6)

## 2021-03-20 MED ORDER — METOPROLOL TARTRATE 50 MG PO TABS
50.0000 mg | ORAL_TABLET | Freq: Two times a day (BID) | ORAL | Status: DC
Start: 2021-03-20 — End: 2021-03-20
  Filled 2021-03-20: qty 1

## 2021-03-20 MED ORDER — POTASSIUM CHLORIDE CRYS ER 20 MEQ PO TBCR
40.0000 meq | EXTENDED_RELEASE_TABLET | Freq: Once | ORAL | Status: AC
  Administered 2021-03-20: 40 meq via ORAL
  Filled 2021-03-20: qty 2

## 2021-03-20 MED ORDER — MAGNESIUM SULFATE 4 GM/100ML IV SOLN
4.0000 g | Freq: Once | INTRAVENOUS | Status: AC
  Administered 2021-03-20: 4 g via INTRAVENOUS
  Filled 2021-03-20: qty 100

## 2021-03-20 MED ORDER — POTASSIUM CHLORIDE CRYS ER 20 MEQ PO TBCR: 40.0000 meq | EXTENDED_RELEASE_TABLET | Freq: Once | ORAL | Status: DC

## 2021-03-20 MED ORDER — DEXTROSE 5 % IV SOLN
30.0000 mmol | Freq: Once | INTRAVENOUS | Status: AC
  Administered 2021-03-20: 30 mmol via INTRAVENOUS
  Filled 2021-03-20: qty 10

## 2021-03-20 MED ORDER — APIXABAN 2.5 MG PO TABS
2.5000 mg | ORAL_TABLET | Freq: Two times a day (BID) | ORAL | Status: DC
  Administered 2021-03-20 – 2021-03-21 (×3): 2.5 mg via ORAL
  Filled 2021-03-20 (×3): qty 1

## 2021-03-20 MED ORDER — HEPARIN (PORCINE) 25000 UT/250ML-% IV SOLN
650.0000 [IU]/h | INTRAVENOUS | Status: DC
  Filled 2021-03-20: qty 250

## 2021-03-20 MED ORDER — METOPROLOL TARTRATE 50 MG PO TABS
62.5000 mg | ORAL_TABLET | Freq: Two times a day (BID) | ORAL | Status: DC
  Administered 2021-03-20: 62.5 mg via ORAL

## 2021-03-20 NOTE — TOC Initial Note (Signed)
Transition of Care (TOC) - Initial/Assessment Note  ? ? ?Patient Details  ?Name: Nicole Mercado ?MRN: [id number] ?Date of Birth: April 16, 1940 ? ?Transition of Care (TOC) CM/SW Contact:    ?Tawanna Cooler, RN ?Phone Number: ?03/20/2021, 10:30 AM ? ?Clinical Narrative:                 ? ? ?Transition of Care (TOC) Screening Note ? ? ?Patient Details  ?Name: Nicole Mercado ?Date of Birth: Sep 05, 1940 ? ? ?Transition of Care (TOC) CM/SW Contact:    ?Tawanna Cooler, RN ?Phone Number: ?03/20/2021, 10:30 AM ? ? ? ?Transition of Care Department [facility]) has reviewed patient and no TOC needs have been identified at this time. We will continue to monitor patient advancement through interdisciplinary progression rounds. If new patient transition needs arise, please place a TOC consult. ?  ? ?Expected Discharge Plan: Home/Self Care ?Barriers to Discharge: Continued Medical Work up ? ? ?Expected Discharge Plan and Services ?Expected Discharge Plan: Home/Self Care ?  ?  ?  ?Living arrangements for the past 2 months: Apartment ?                ?   ? ?Prior Living Arrangements/Services ?Living arrangements for the past 2 months: Apartment ?Lives with:: Self ?Patient language and need for interpreter reviewed:: Yes ?       ?Need for Family Participation in Patient Care: Yes (Comment) ?Care giver support system in place?: Yes (comment) ?  ?Criminal Activity/Legal Involvement Pertinent to Current Situation/Hospitalization: No - Comment as needed ? ?Activities of Daily Living ?Home Assistive Devices/Equipment: Eyeglasses ?ADL Screening (condition at time of admission) ?Patient's cognitive ability adequate to safely complete daily activities?: Yes ?Is the patient deaf or have difficulty hearing?: No ?Does the patient have difficulty seeing, even when wearing glasses/contacts?: No ?Does the patient have difficulty concentrating, remembering, or making decisions?: No ?Patient able to express need for assistance with ADLs?: Yes ?Does the  patient have difficulty dressing or bathing?: Yes ?Independently performs ADLs?: Yes (appropriate for developmental age) ?Does the patient have difficulty walking or climbing stairs?: Yes ?Weakness of Legs: None ?Weakness of Arms/Hands: None ? ?  ?Alcohol / Substance Use: Not Applicable ?Psych Involvement: No (comment) ? ?Admission diagnosis:  PAD (peripheral artery disease) ([place]) [I73.9] ?Atrial fibrillation with RVR ([place]) [I48.91] ?Syncope, unspecified syncope type [R55] ?Atrial fibrillation, unspecified type ([place]) [I48.91] ?Patient Active Problem List  ? Diagnosis Date Noted  ? Atrial fibrillation with RVR ([place]) 03/19/2021  ? PAD (peripheral artery disease) ([place]) 10/19/2020  ? CKD (chronic kidney disease), stage III ([place]) 03/25/2019  ? History of basal cell cancer 02/11/2019  ? PAC (premature atrial contraction) 01/14/2019  ? Goals of care, counseling/discussion 12/08/2018  ? Skin lesion 12/18/2016  ? Hypercalcemia 06/19/2016  ? Drug-induced neutropenia ([place]) 05/27/2016  ? Hematuria, gross 08/28/2015  ? Easy bruising 05/31/2015  ? Encounter for chemotherapy management 11/03/2014  ? Essential hypertension 10/30/2014  ? 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 S/P AVR 01/17/2014  ? CAD (coronary artery disease), native coronary artery 01/16/2014  ? Aortic stenosis 01/11/2014  ? Arrhythmia 01/11/2014  ? Syncope and collapse 01/11/2014  ? Faintness   ? Aftercare following surgery of the circulatory system, [place] 08/18/2013  ? Occlusion and stenosis of carotid artery without mention of cerebral infarction 06/03/2011  ? CLL (chronic lymphocytic leukemia) ([place]) 03/21/2011  ? Cardiac murmur 03/21/2011  ? Carotid artery stenosis 03/21/2011  ? Thrombocytopenia ([place]) 11/19/2010  ? Lymphadenopathy 11/11/2010  ? ?PCP:  Kristie Cowman, MD ?Pharmacy:   ?[place] [place] [facility] [address] ?[address] ?[place] [address] ?Phone: [phone] Fax: [phone] ? ?[facility] ?[address] ?[place] [address] ?Phone: [phone] Fax: [phone] ? ? ? ?Readmission Risk Interventions ?No flowsheet data found. ? ? ?

## 2021-03-20 NOTE — Consult Note (Addendum)
?Cardiology Consultation:  ? ?Patient ID: Nicole Mercado ?MRN: [id number]; DOB: 1940-10-27 ? ?Admit date: 03/19/2021 ?Date of Consult: 03/20/2021 ? ?PCP:  Nicole Cowman, MD ?  ?[facility] Providers ?Cardiologist:  Nicole Mocha, MD  ?Cardiology APP:  Nicole Shi, PA-C     ? ? ?Patient Profile:  ? ?Nicole Mercado is a 81 y.o. female with a hx of CAD and aortic stenosis s/p CABG + bioprosthetic AVR 01/2014, paroxysmal atrial fibrillation, HTN, carotid artery disease s/p L CEA 06/2011, HLD, aortic atherosclerosis, CLL, chronic thrombocytopenia, PAD by noninvasive imaging (last assessed 10/2020), syncope who is being seen 03/20/2021 for the evaluation of syncope and atrial fibrillation at the request of Dr. Francia Mercado. ? ?History of Present Illness:  ? ?Nicole Mercado has the above history with severe AS and CAD leading to CABG/bioprosthetic AVR in 2016. She had some post-op atrial fibrillation around that time. She was seen in 08/2020 for episode of syncope that occurred without warning at a grocery store. 2D echo 08/2020 EF >75% with hyperdynamic function, no RWMA, normal RV with mildly elevated PASP, mild LAE, mild MR, mild-moderate TR, mild AI, minimal change in AV gradients from 2020. Monitor 08/2020 showed NSR, possible atrial fib with very low burden of 3 hours during monitoring period (baseline artifact made interpretation difficult), no high grade heart block/pauses, occasional PVCs/supraventricular beats, no sustained ventricular arrhythmias. Given her CLL, low platelet count, and low AFib burden, the decision was made not to anticoagulate but she was referred to EP to consider ILR. Do not see this was scheduled; appt desk screen states patient refusal. The patient does not recall ever getting a call about this. She also had a carotid duplex 10/2020 (1-39% BICA with occluded RECA) as well as LE duplex (30-49% proximal CFA, ostial SFA distal SFA, 50-74% stenosis found at proximal CFA and mid SFA, 30-49% stenosis at the  proximal popliteal artery) but ABIs were normal. Referral to VVS was placed but do not see this occurred either. Stress test 10/2020 (done for the syncope) was low risk with a defect felt c/w artifact. ? ?She presented to [facility] yesterday with 2 episodes of syncope. Her friend is in the room who was present when this occurred. 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 She had not eaten breakfast. Upon return home she was getting up to go to the bathroom and suddenly felt weak and fell to the floor/syncopized. Her friend was able to catch her and lay her back. She was able to stand up but then passed out again briefly. LOC was very brief, only seconds, no b/b incontinence, or seizure activity. Friend checked BP which was around 242 systolic which he states was lower than her usual values. Upon arrival found to have hypokalemia, hyponatremia, and afib RVR. She was treated with IV fluids and IV diltiazem. Amlodipine stopped. BP labile - initial 103/68 then peak of 170/70, last check 147/63. Orthostatics not performed. HR remains 100-110s in AF. No pauses or ventricular arrhythmias on telemetry. ? ?Labs: leukocytosis 12.1, hyponatremia 132, hypokalemia 3.3, AKI with BUN 28 (Cr normal), hsTroponin 25-26, TSH wnl, Hgb and platelets normal ?Rads: CXR NAD, CTA neg for PE, trace L pleural effusion, cardiomegaly, prior CABG, aortic atherosclerosis ? ?Past Medical History:  ?Diagnosis Date  ? Aortic stenosis   ? a. Echocardiogram (01/11/14):  Mild LVH, EF 60-65%, Gr 1 DD, possible bicuspid AV, severe AS (mean 61 mmHg, peak 104 mmHg), mild MVP of post  leaflet, mild MR, PASP 33 mmHg.;  b. 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 Arthritis   ? Atrial fibrillation ([place])   ? post op after CABG+AVR >> Amiodarone  ? CAD (coronary artery disease)   ? a. LHC (01/13/14):  dLM 70 extending into oLAD, mLAD 40-50, pD1 80, pD2 70, ostial/prox OM1 70 >> CABG (L-LAD, S-OM1, S-D1, S-D2)  // Myoview 10/22:  Fixed defect anterolateral wall consistent with artifact, EF 71, no ischemia, low risk  ? Carotid artery occlusion   ? a. s/p L CEA 2013;  b.  Carotid US (1/16):  Bilateral ICA 1-39% // Carotid US 10/22: Bilateral ICA 1-39  ? CLL (chronic lymphocytic leukemia) ([place])   ? slow leukemia---Dr  Murinson  ? H/O exercise stress test   ? NSSTT  ? Hx of cardiovascular stress test   ? a. Nuclear stress test That (4/13): Normal perfusion, EF 74%  ? Hx of echocardiogram   ? Echo (2/16):  Mild LVH, EF 60-65%, no RWMA, Gr 2 DD, AVR ok (mean 9 mmHg), trivial AI, mild MR, mild LAE, PASP 40 mmHg  ? Hypertension   ? PAD (peripheral artery disease) ([place])   ? LE Arterial US 10/22: R - pCFA, oSFA, dSFA 30-49; L - pCFA + mSFA 50-74, pPop 30-49 >> referred to VVS  ? Pancreatitis   ? h/o  ? Thrombocytopenia ([place]) 11/19/2010  ? ? ?Past Surgical History:  ?Procedure Laterality Date  ? ABDOMINAL HYSTERECTOMY    ? AORTIC VALVE REPLACEMENT [name] 01/17/2014  ? Procedure: AORTIC VALVE REPLACEMENT (AVR);  Surgeon: Gaye Pollack, MD;  Location: [facility];  Service: Open Heart Surgery;  Laterality: [name];  ? APPENDECTOMY    ? CAROTID ENDARTERECTOMY  06/09/11  ? LEFT  cea  ? CESAREAN SECTION    ? FIVE  ? CHOLECYSTECTOMY    ? CORONARY ARTERY BYPASS GRAFT [name] 01/17/2014  ? Procedure: CORONARY ARTERY BYPASS GRAFTING (CABG)TIMES FOUR USING LEFT INTERNAL MAMMARY ARTERY AND RIGHT SAPHENOUS VEIN HARVESTED ENDOSCOPICALLY;  Surgeon: Gaye Pollack, MD;  Location: [facility] OR;  Service: Open Heart Surgery;  Laterality: [name];  ? ENDARTERECTOMY  06/09/2011  ? Procedure: ENDARTERECTOMY CAROTID;  Surgeon: Rosetta Posner, MD;  Location: [facility] OR;  Service: Vascular;  Laterality: Left;  left carotid endarterectomy with patch angioplasty  ? LEFT AND RIGHT HEART CATHETERIZATION WITH CORONARY ANGIOGRAM [name] 01/13/2014  ? Procedure: LEFT AND RIGHT HEART CATHETERIZATION WITH CORONARY ANGIOGRAM;  Surgeon: Jettie Booze, MD;  Location: [facility] C[facility] LAB;  Service: Cardiovascular;  Laterality: [name];  ?  TEE WITHOUT CARDIOVERSION [name] 01/17/2014  ? Procedure: TRANSESOPHAGEAL ECHOCARDIOGRAM (TEE);  Surgeon: Gaye Pollack, MD;  Location: [facility];  Service: Open Heart Surgery;  Laterality: [name];  ? TONSILLECTOMY    ?  ? ?Home Medications:  ?Prior to Admission medications   ?Medication Sig Start Date End Date Taking? Authorizing Provider  ?Acalabrutinib Maleate (CALQUENCE) 100 MG TABS Take 1 tablet (100 mg) by mouth 2 (two) times daily. 02/06/21  Yes Heath Lark, MD  ?acetaminophen (TYLENOL) 325 MG tablet Take 650 mg by mouth every 6 (six) hours as needed for mild pain or headache.    Yes [provider]  ?amLODipine (NORVASC) 5 MG tablet Take 1.5 tablets (7.5 mg total) by mouth daily. Pt needs to keep upcoming appt in Mar for further refills 03/08/21  Yes Kathlen Mody, Nicki Reaper T, PA-C  ?aspirin 81 MG tablet Take 81 mg by mouth daily.   Yes [provider]  ?atorvastatin (LIPITOR) 10  MG tablet TAKE 1 TABLET BY MOUTH ONCE DAILY AT  6  IN  THE  EVENING ?Patient taking differently: Take 10 mg by mouth daily. 12/28/20  Yes Nicole Mocha, MD  ?Calcium Carb-Cholecalciferol 500-125 MG-UNIT TABS Take 1 tablet by mouth daily.   Yes [provider]  ?metoprolol succinate (TOPROL-XL) 50 MG 24 hr tablet Take 1 tablet by mouth once daily ?Patient taking differently: Take 50 mg by mouth daily. 02/14/21  Yes Nicole Mocha, MD  ?Multiple Vitamins-Minerals (HM MULTIVITAMIN ADULT GUMMY PO) Take 1 tablet by mouth daily.   Yes [provider]  ?oxybutynin (DITROPAN-XL) 10 MG 24 hr tablet Take 10 mg by mouth daily. 03/08/21  Yes [provider]  ?oxybutynin (DITROPAN-XL) 5 MG 24 hr tablet Take 5 mg by mouth at bedtime.   Yes [provider]  ?amoxicillin (AMOXIL) 500 MG capsule TAKE FOUR CAPSULES BY MOUTH ONE HOUR BEFORE APPOINTMENT ?Patient not taking: Reported on 03/19/2021 07/30/20   Nicole Mocha, MD  ? ? ?Inpatient Medications: ?Scheduled Meds: ? Acalabrutinib Maleate  100 mg Oral BID  ? aspirin EC   81 mg Oral Daily  ? atorvastatin  10 mg Oral q1800  ? metoprolol succinate  50 mg Oral Daily  ? oxybutynin  5 mg Oral QHS  ? ?Continuous Infusions: ? diltiazem (CARDIZEM) infusion 2.5 mg/hr (03/20/21

## 2021-03-20 NOTE — Plan of Care (Signed)
?  Problem: Education: ?Goal: Knowledge of disease or condition will improve ?Outcome: Progressing ?  ?Problem: Cardiac: ?Goal: Ability to achieve and maintain adequate cardiopulmonary perfusion will improve ?Outcome: Progressing ?  ?Problem: Clinical Measurements: ?Goal: Cardiovascular complication will be avoided ?Outcome: Progressing ?  ?Problem: Activity: ?Goal: Risk for activity intolerance will decrease ?Outcome: Progressing ?  ?

## 2021-03-20 NOTE — Progress Notes (Signed)
K 3.4, Mg 1.5 -> will supplement today. ?

## 2021-03-20 NOTE — Progress Notes (Signed)
ANTICOAGULATION CONSULT NOTE  ? ?Pharmacy Consult for Heparin ?Indication: atrial fibrillation ? ?No Known Allergies ? ?Patient Measurements: ?Height: 5' (152.4 cm) ?Weight: 41.7 kg (92 lb) ?IBW/kg (Calculated) : 45.5 ?Heparin Dosing Weight: TBW ? ?Vital Signs: ?Temp: 98.1 ?F (36.7 ?C) (03/15 0000) ?Temp Source: Oral (03/15 0000) ?BP: 132/67 (03/15 0000) ?Pulse Rate: 91 (03/14 2104) ? ?Labs: ?Recent Labs  ?  03/19/21 ?1459 03/19/21 ?1505 03/19/21 ?1705 03/20/21 ?0304  ?HGB 14.3  --   --  12.0  ?HCT 41.8  --   --  35.5*  ?PLT 226  --   --  201  ?APTT  --   --  24  --   ?LABPROT  --   --  13.5 14.4  ?INR  --   --  1.0 1.1  ?HEPARINUNFRC  --   --   --  0.43  ?CREATININE  --  0.93  --  0.72  ?TROPONINIHS  --  25* 26*  --   ? ? ? ?Estimated Creatinine Clearance: 36.9 mL/min (by C-G formula based on SCr of 0.72 mg/dL). ? ? ?Medical History: ?Past Medical History:  ?Diagnosis Date  ? Aortic stenosis   ? a. Echocardiogram (01/11/14):  Mild LVH, EF 60-65%, Gr 1 DD, possible bicuspid AV, severe AS (mean 61 mmHg, peak 104 mmHg), mild MVP of post leaflet, mild MR, PASP 33 mmHg.;  b. 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 Arthritis   ? Atrial fibrillation ([place])   ? post op after CABG+AVR >> Amiodarone  ? CAD (coronary artery disease)   ? a. LHC (01/13/14):  dLM 70 extending into oLAD, mLAD 40-50, pD1 80, pD2 70, ostial/prox OM1 70 >> CABG (L-LAD, S-OM1, S-D1, S-D2)  // Myoview 10/22: Fixed defect anterolateral wall consistent with artifact, EF 71, no ischemia, low risk  ? Carotid artery occlusion   ? a. s/p L CEA 2013;  b.  Carotid US (1/16):  Bilateral ICA 1-39% // Carotid US 10/22: Bilateral ICA 1-39  ? CLL (chronic lymphocytic leukemia) ([place])   ? slow leukemia---Dr  Murinson  ? H/O exercise stress test   ? NSSTT  ? Hx of cardiovascular stress test   ? a. Nuclear stress test That (4/13): Normal perfusion, EF 74%  ? Hx of echocardiogram   ? Echo (2/16):  Mild LVH, EF 60-65%, no RWMA, Gr 2 DD, AVR ok (mean 9 mmHg), trivial AI, mild MR,  mild LAE, PASP 40 mmHg  ? Hypertension   ? PAD (peripheral artery disease) ([place])   ? LE Arterial US 10/22: R - pCFA, oSFA, dSFA 30-49; L - pCFA + mSFA 50-74, pPop 30-49 >> referred to VVS  ? Pancreatitis   ? h/o  ? Thrombocytopenia ([place]) 11/19/2010  ? ? ?Medications:  ?Scheduled:  ? Acalabrutinib Maleate  100 mg Oral BID  ? aspirin EC  81 mg Oral Daily  ? atorvastatin  10 mg Oral q1800  ? metoprolol succinate  50 mg Oral Daily  ? oxybutynin  5 mg Oral QHS  ? potassium chloride  40 mEq Oral Once  ? ?Infusions:  ? sodium chloride 75 mL/hr at 03/19/21 2110  ? diltiazem (CARDIZEM) infusion 2.5 mg/hr (03/20/21 0014)  ? heparin 600 Units/hr (03/19/21 1857)  ? ? ?Assessment: ?Pharmacy is consulted to dose heparin for Afib in this 86 yoF who presented to ED from home with syncope.  PMH is significant for aortic stenosis s/p aortic valve replacement (01/2014), CAD STEMI (2016) s/p CABG, carotid artery stenosis s/p left carotid  endarterectomy, HTN, and CLL. ?No known prior to admission anticoagulation.  Baseline coags wnl.  ?CBC: Hgb and Plt wnl ?SCr 0.93 ? ?03/20/2021: ?Initial heparin level 0.43 on IV heparin 600 units/hr ?CBC: Hg , pltc WNL ?RN reported earlier bleeding at IV site which has resolved.  Infusion was paused ~ 10 min while new IV site was established.  ? ? ?Goal of Therapy:  ?Heparin level 0.3-0.7 units/ml ?Monitor platelets by anticoagulation protocol: Yes ?  ?Plan:  ?Continue heparin IV infusion at 600 units/hr ?Confirmatory Heparin level at 1100 ?Daily heparin level and CBC ? ? ?Netta Cedars PharmD, BCPS ?Clinical Pharmacist ?[facility] main pharmacy [phone] ?03/20/2021 3:39 AM ?

## 2021-03-20 NOTE — Progress Notes (Signed)
?PROGRESS NOTE ? ? ? ?Nicole Mercado  CWC:[id number] DOB: [birth year] DOA: 03/19/2021 ?PCP: Kristie Cowman, MD  ? ? ? ?Brief Narrative:  ?81 y.o. WF PMHx Aortic stenosis status post aortic valve replacement on 01/17/2014, CAD STEMI in 2016 underwent CABG during that time, carotid artery stenosis status post left carotid endarterectomy, essential hypertension and CLL  ? ?into the hospital for 2 syncopal episodes that happened the day of admission, the first when she was standing up she felt lightheaded but did not fall completely.  Shortly thereafter she attempted to stand up and passed out completely unconscious to the floor did not hit her head only lost consciousness for several seconds, she denies any nausea vomiting fever upper respiratory tract infections.  The patient relates she drove back and forth to from [place] to [facility] back and forth on the same day without stopping and not keeping herself hydrated. ?  ?In the ED: ?She was found to be hyponatremic hypokalemic, with a mild leukocytosis, SARS-CoV-2 influenza PCR were negative, chest x-ray showed no infiltrates twelve-lead EKG showed A-fib with RVR ? ? ?Subjective: ?A/O x4, patient remains orthostatic.  Patient not sure if she was symptomatic  ? ? ? ?Assessment & Plan: ?Covid vaccination; ?  ?Principal Problem: ?  Atrial fibrillation with RVR ([place]) ?Active Problems: ?  Syncope and collapse ?  Essential hypertension ?  Aortic stenosis ?  S/P AVR ?  CKD (chronic kidney disease), stage III ([place]) ?  CLL (chronic lymphocytic leukemia) ([place]) ?  PAD (peripheral artery disease) ([place]) ? ? ? Atrial fibrillation with RVR ([place]) ?With a CHA2DS2-VASc score of at least 6. ?We will go ahead and start her on IV diltiazem drip try to keep her heart less than 100. ?We will go ahead and start her on IV heparin. ?Due to her history of traveling in a car for more than 4 hours with minimal hydration and as PE being is one of the causes or triggers of A-fib we will go  ahead and get a CT angio of the chest to rule out a PE. ?We will hydrate to minimize acute kidney injury. ?Cardiology has been consulted. ?The echo less than a year ago showed an EF of 76% with diastolic dysfunction ?Her platelet count is greater than 50,000 she has no contraindication to anticoagulation or antiplatelet therapy at this point in time her platelet count was greater than 200k on admission. ?Check a TSH. ?It is noted as per patient she has never had a colonoscopy. ?  ?Syncope and collapse ?Likely due to above admitted to telemetry. ?CT angio of the chest has been ordered. ?-3/14 Cardizem drip started secondary to A-fib with RVR ?-3/15 patient meets guidelines for Orthostatic Hypotensive ?-3/15 DC Cardizem drip ?- 3/15 increase Metoprolol 62.5 mg BID ?-3/15 AM Cortisol pending ?-3/15 Echocardiogram pending ?  ?Essential hypertension ?-See syncope and collapse ? ?Aortic stenosis/ S/P AVR ?On aspirin. ?  ?CKD (chronic kidney disease), stage III a ?Creatinine at baseline continue to monitor. ?  ?CLL (chronic lymphocytic leukemia) ([place]) ?Cell lineage shows a white count of 12 she has remained afebrile, hemoglobin of 14 and a platelet count of 226. ?-3/15 Per Dr. Alvy Bimler oncology hold patient's chemo [place].  Contact Dr. Alvy Bimler oncology to determine if she wants patient to restart on discharge or hold until her follow-up next week. ?-Follow-up Dr. Alvy Bimler oncology on 3/27  ?  ?PAD (peripheral artery disease) ([place]) ?Continue aspirin. ? ?Hypokalemia ?-Potassium goal> 4 ?- 3/15 K-Dur 40 meq  ?-  3/15 see Hypophosphatemia ? ?Hypophosphatemia ?- Phosphorus goal> 2.5 ?- K-Phos 30 mmol ? ?Magnesium ?- Magnesium goal> 2 ?- 3/15 Magnesium 80 4 g ? ? ? ? ? ?  ? ? ?  ?Body mass index is 18.73 kg/m?. ? ? ? .[id number] ? ?DVT prophylaxis:  ?Code Status:  ?Family Communication:  ?Status is: Inpatient ? ? ? ?Dispo: The patient is from:  ?             Anticipated d/c is to:  ?             Anticipated d/c date is:  ?              Patient currently  ? ? ? ? ? ?Consultants:  ?Cardiology ? ? ?Procedures/Significant Events:  ? ? ?I have personally reviewed and interpreted all radiology studies and my findings are as above. ? ?VENTILATOR SETTINGS: ? ? ? ?Cultures ? ? ?Antimicrobials: ? ? ? ?Devices ?  ? ?LINES / TUBES:  ? ? ? ? ?Continuous Infusions: ? diltiazem (CARDIZEM) infusion 2.5 mg/hr (03/20/21 0014)  ? heparin 600 Units/hr (03/19/21 1857)  ? ? ? ?Objective: ?Vitals:  ? 03/19/21 2300 03/19/21 2305 03/20/21 0000 03/20/21 0408  ?BP: 116/60  132/67 (!) 147/63  ?Pulse:      ?Resp: '20 16 16 20  '$ ?Temp:   98.1 ?F (36.7 ?C) 98.1 ?F (36.7 ?C)  ?TempSrc:   Oral Oral  ?SpO2:    97%  ?Weight:    43.5 kg  ?Height:      ? ? ?Intake/Output Summary (Last 24 hours) at 03/20/2021 0900 ?Last data filed at 03/20/2021 0710 ?Gross per 24 hour  ?Intake 1501.88 ml  ?Output --  ?Net 1501.88 ml  ? ?Filed Weights  ? 03/19/21 1452 03/20/21 0408  ?Weight: 41.7 kg 43.5 kg  ? ? ?Examination: ? ?General: A/O x4, No acute respiratory distress, cachectic ?Eyes: negative scleral hemorrhage, negative anisocoria, negative icterus ?ENT: Negative Runny nose, negative gingival bleeding, ?Neck:  Negative scars, masses, torticollis, lymphadenopathy, JVD ?Lungs: Clear to auscultation bilaterally without wheezes or crackles ?Cardiovascular: Regular rate and rhythm without murmur gallop or rub normal S1 and S2 ?Abdomen: negative abdominal pain, nondistended, positive soft, bowel sounds, no rebound, no ascites, no appreciable mass ?Extremities: No significant cyanosis, clubbing, or edema bilateral lower extremities ?Skin: Negative rashes, lesions, ulcers ?Psychiatric:  Negative depression, negative anxiety, negative fatigue, negative mania  ?Central nervous system:  Cranial nerves II through XII intact, tongue/uvula midline, all extremities muscle strength 5/5, sensation intact throughout, negative dysarthria, negative expressive aphasia, negative receptive aphasia. ? ?.   ? ? ? ?Data Reviewed: Care during the described time interval was provided by me .  I have reviewed this patient's available data, including medical history, events of note, physical examination, and all test results as part of my evaluation. ? ?CBC: ?Recent Labs  ?Lab 03/19/21 ?1459 03/20/21 ?0304  ?WBC 12.1* 9.0  ?HGB 14.3 12.0  ?HCT 41.8 35.5*  ?MCV 96.5 97.3  ?PLT 226 201  ? ?Basic Metabolic Panel: ?Recent Labs  ?Lab 03/19/21 ?1505 03/20/21 ?0304  ?NA 132* 138  ?K 3.3* 3.1*  ?CL 99 106  ?CO2 25 23  ?GLUCOSE 119* 111*  ?BUN 28* 20  ?CREATININE 0.93 0.72  ?CALCIUM 9.0 8.1*  ? ?GFR: ?Estimated Creatinine Clearance: 38.5 mL/min (by C-G formula based on SCr of 0.72 mg/dL). ?Liver Function Tests: ?Recent Labs  ?Lab 03/19/21 ?1505  ?AST 24  ?ALT 27  ?ALKPHOS 72  ?BILITOT 0.6  ?  PROT 5.8*  ?ALBUMIN 3.3*  ? ?No results for input(s): LIPASE, AMYLASE in the last 168 hours. ?No results for input(s): AMMONIA in the last 168 hours. ?Coagulation Profile: ?Recent Labs  ?Lab 03/19/21 ?1705 03/20/21 ?0304  ?INR 1.0 1.1  ? ?Cardiac Enzymes: ?No results for input(s): CKTOTAL, CKMB, CKMBINDEX, TROPONINI in the last 168 hours. ?BNP (last 3 results) ?No results for input(s): PROBNP in the last 8760 hours. ?HbA1C: ?No results for input(s): HGBA1C in the last 72 hours. ?CBG: ?Recent Labs  ?Lab 03/19/21 ?1601  ?GLUCAP 108*  ? ?Lipid Profile: ?No results for input(s): CHOL, HDL, LDLCALC, TRIG, CHOLHDL, LDLDIRECT in the last 72 hours. ?Thyroid Function Tests: ?Recent Labs  ?  03/20/21 ?0304  ?TSH 1.007  ? ?Anemia Panel: ?No results for input(s): VITAMINB12, FOLATE, FERRITIN, TIBC, IRON, RETICCTPCT in the last 72 hours. ?Sepsis Labs: ?No results for input(s): PROCALCITON, LATICACIDVEN in the last 168 hours. ? ?Recent Results (from the past 240 hour(s))  ?Resp Panel by RT-PCR (Flu A&B, Covid) Nasopharyngeal Swab     Status: None  ? Collection Time: 03/19/21  3:05 PM  ? Specimen: Nasopharyngeal Swab; Nasopharyngeal(NP) swabs in vial transport  medium  ?Result Value Ref Range Status  ? SARS Coronavirus 2 by RT PCR NEGATIVE NEGATIVE Final  ?  Comment: (NOTE) ?SARS-CoV-2 target nucleic acids are NOT DETECTED. ? ?The SARS-CoV-2 RNA is generally detec

## 2021-03-20 NOTE — Plan of Care (Signed)
?  Problem: Education: ?Goal: Knowledge of disease or condition will improve ?Outcome: Progressing ?Goal: Understanding of medication regimen will improve ?Outcome: Progressing ?  ?Problem: Activity: ?Goal: Ability to tolerate increased activity will improve ?Outcome: Progressing ?  ?Problem: Cardiac: ?Goal: Ability to achieve and maintain adequate cardiopulmonary perfusion will improve ?Outcome: Progressing ?  ?Problem: Health Behavior/Discharge Planning: ?Goal: Ability to safely manage health-related needs after discharge will improve ?Outcome: Progressing ?  ?Problem: Health Behavior/Discharge Planning: ?Goal: Ability to manage health-related needs will improve ?Outcome: Progressing ?  ?Problem: Clinical Measurements: ?Goal: Ability to maintain clinical measurements within normal limits will improve ?Outcome: Progressing ?Goal: Will remain free from infection ?Outcome: Progressing ?Goal: Diagnostic test results will improve ?Outcome: Progressing ?Goal: Cardiovascular complication will be avoided ?Outcome: Progressing ?  ?

## 2021-03-20 NOTE — Progress Notes (Signed)
ANTICOAGULATION CONSULT NOTE  ? ?Pharmacy Consult for Heparin ?Indication: atrial fibrillation ? ?No Known Allergies ? ?Patient Measurements: ?Height: 5' (152.4 cm) ?Weight: 43.5 kg (95 lb 14.4 oz) ?IBW/kg (Calculated) : 45.5 ?Heparin Dosing Weight: TBW ? ?Vital Signs: ?Temp: 98.1 ?F (36.7 ?C) (03/15 0408) ?Temp Source: Oral (03/15 0408) ?BP: 132/84 (03/15 0909) ?Pulse Rate: 66 (03/15 0909) ? ?Labs: ?Recent Labs  ?  03/19/21 ?1459 03/19/21 ?1505 03/19/21 ?1705 03/20/21 ?0304 03/20/21 ?1035  ?HGB 14.3  --   --  12.0  --   ?HCT 41.8  --   --  35.5*  --   ?PLT 226  --   --  201  --   ?APTT  --   --  24  --   --   ?LABPROT  --   --  13.5 14.4  --   ?INR  --   --  1.0 1.1  --   ?HEPARINUNFRC  --   --   --  0.43 0.30  ?CREATININE  --  0.93  --  0.72  --   ?TROPONINIHS  --  25* 26*  --   --   ? ? ? ?Estimated Creatinine Clearance: 38.5 mL/min (by C-G formula based on SCr of 0.72 mg/dL). ? ? ?Medical History: ?Past Medical History:  ?Diagnosis Date  ? Aortic stenosis   ? a. Echocardiogram (01/11/14):  Mild LVH, EF 60-65%, Gr 1 DD, possible bicuspid AV, severe AS (mean 61 mmHg, peak 104 mmHg), mild MVP of post leaflet, mild MR, PASP 33 mmHg.;  b. 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 Arthritis   ? Atrial fibrillation ([place])   ? post op after CABG+AVR >> Amiodarone  ? CAD (coronary artery disease)   ? a. LHC (01/13/14):  dLM 70 extending into oLAD, mLAD 40-50, pD1 80, pD2 70, ostial/prox OM1 70 >> CABG (L-LAD, S-OM1, S-D1, S-D2)  // Myoview 10/22: Fixed defect anterolateral wall consistent with artifact, EF 71, no ischemia, low risk  ? Carotid artery occlusion   ? a. s/p L CEA 2013;  b.  Carotid US (1/16):  Bilateral ICA 1-39% // Carotid US 10/22: Bilateral ICA 1-39  ? CLL (chronic lymphocytic leukemia) ([place])   ? slow leukemia---Dr  Murinson  ? H/O exercise stress test   ? NSSTT  ? Hx of cardiovascular stress test   ? a. Nuclear stress test That (4/13): Normal perfusion, EF 74%  ? Hx of echocardiogram   ? Echo (2/16):  Mild LVH, EF  60-65%, no RWMA, Gr 2 DD, AVR ok (mean 9 mmHg), trivial AI, mild MR, mild LAE, PASP 40 mmHg  ? Hypertension   ? PAD (peripheral artery disease) ([place])   ? LE Arterial US 10/22: R - pCFA, oSFA, dSFA 30-49; L - pCFA + mSFA 50-74, pPop 30-49 >> referred to [facility]  ? Pancreatitis   ? h/o  ? Thrombocytopenia ([place]) 11/19/2010  ? ? ?Medications:  ?Scheduled:  ? aspirin EC  81 mg Oral Daily  ? atorvastatin  10 mg Oral q1800  ? metoprolol succinate  50 mg Oral Daily  ? oxybutynin  5 mg Oral QHS  ? ?Infusions:  ? diltiazem (CARDIZEM) infusion 2.5 mg/hr (03/20/21 0014)  ? heparin 600 Units/hr (03/19/21 1857)  ? ? ?Assessment: ?Pharmacy is consulted to dose heparin for Afib in this 89 yoF who presented to ED from home with syncope.  PMH is significant for aortic stenosis s/p aortic valve replacement (01/2014), CAD STEMI (2016) s/p CABG, carotid artery stenosis s/p  left carotid endarterectomy, HTN, and CLL. ?No known prior to admission anticoagulation.  Baseline coags wnl.  ?CBC: Hgb and Plt wnl ?SCr 0.93 ? ?03/20/2021: ?2nd heparin still therapeutic on current IV heparin rate of 600 units/hr but dropping and now on low end of therapeutic range ?CBC: Hgb , pltc WNL ?RN reported earlier bleeding at IV site which has resolved.  Infusion was paused ~ 10 min while new IV site was established.  ? ? ?Goal of Therapy:  ?Heparin level 0.3-0.7 units/ml ?Monitor platelets by anticoagulation protocol: Yes ?  ?Plan:  ?Increase IV heparin from current rate of 600 to 650 units/hr ?Recheck heparin level 8 hours after rate change ?Daily heparin level and CBC ? ? ?Adrian Saran, PharmD, BCPS ?Secure Chat if ?s ?03/20/2021 11:32 AM ? ?

## 2021-03-21 ENCOUNTER — Other Ambulatory Visit (HOSPITAL_COMMUNITY): Payer: Self-pay

## 2021-03-21 ENCOUNTER — Telehealth: Payer: Self-pay | Admitting: Physician Assistant

## 2021-03-21 DIAGNOSIS — Z952 Presence of prosthetic heart valve: Secondary | ICD-10-CM

## 2021-03-21 DIAGNOSIS — I350 Nonrheumatic aortic (valve) stenosis: Secondary | ICD-10-CM

## 2021-03-21 LAB — PHOSPHORUS: Phosphorus: 3 mg/dL (ref 2.5–4.6)

## 2021-03-21 LAB — COMPREHENSIVE METABOLIC PANEL
ALT: 20 U/L (ref 0–44)
AST: 17 U/L (ref 15–41)
Albumin: 2.9 g/dL — ABNORMAL LOW (ref 3.5–5.0)
Alkaline Phosphatase: 54 U/L (ref 38–126)
Anion gap: 6 (ref 5–15)
BUN: 14 mg/dL (ref 8–23)
CO2: 25 mmol/L (ref 22–32)
Calcium: 8.2 mg/dL — ABNORMAL LOW (ref 8.9–10.3)
Chloride: 102 mmol/L (ref 98–111)
Creatinine, Ser: 0.83 mg/dL (ref 0.44–1.00)
GFR, Estimated: >60 mL/min (ref >60)
Glucose, Bld: 115 mg/dL — ABNORMAL HIGH (ref 70–99)
Potassium: 3.4 mmol/L — ABNORMAL LOW (ref 3.5–5.1)
Sodium: 133 mmol/L — ABNORMAL LOW (ref 135–145)
Total Bilirubin: 0.4 mg/dL (ref 0.3–1.2)
Total Protein: 5 g/dL — ABNORMAL LOW (ref 6.5–8.1)

## 2021-03-21 LAB — CBC
HCT: 36.4 % (ref 36.0–46.0)
Hemoglobin: 12.3 g/dL (ref 12.0–15.0)
MCH: 33.2 pg (ref 26.0–34.0)
MCHC: 33.8 g/dL (ref 30.0–36.0)
MCV: 98.1 fL (ref 80.0–100.0)
Platelets: 168 10*3/uL (ref 150–400)
RBC: 3.71 MIL/uL — ABNORMAL LOW (ref 3.87–5.11)
RDW: 12.5 % (ref 11.5–15.5)
WBC: 7.8 10*3/uL (ref 4.0–10.5)
nRBC: 0 % (ref 0.0–0.2)

## 2021-03-21 LAB — CORTISOL-AM, BLOOD: Cortisol - AM: 13.1 ug/dL (ref 6.7–22.6)

## 2021-03-21 LAB — MAGNESIUM: Magnesium: 2.2 mg/dL (ref 1.7–2.4)

## 2021-03-21 MED ORDER — METOPROLOL TARTRATE 75 MG PO TABS: 75.0000 mg | ORAL_TABLET | Freq: Two times a day (BID) | ORAL | 0 refills | Status: DC

## 2021-03-21 MED ORDER — APIXABAN 2.5 MG PO TABS: 2.5000 mg | ORAL_TABLET | Freq: Two times a day (BID) | ORAL | 0 refills | Status: DC

## 2021-03-21 MED ORDER — SODIUM CHLORIDE 0.9 % IV SOLN: INTRAVENOUS | Status: DC

## 2021-03-21 MED ORDER — POTASSIUM CHLORIDE CRYS ER 20 MEQ PO TBCR
50.0000 meq | EXTENDED_RELEASE_TABLET | Freq: Once | ORAL | Status: AC
  Administered 2021-03-21: 50 meq via ORAL
  Filled 2021-03-21: qty 1

## 2021-03-21 MED ORDER — METOPROLOL TARTRATE 50 MG PO TABS
75.0000 mg | ORAL_TABLET | Freq: Two times a day (BID) | ORAL | Status: DC
  Administered 2021-03-21: 75 mg via ORAL
  Filled 2021-03-21: qty 1

## 2021-03-21 NOTE — Telephone Encounter (Signed)
IM appears to be discharging pt this afternoon. ?Per d/w Dr. Percival Spanish, plan 30 day monitor as OP -> will route this msg to monitor team to help arrange. ?He recommends hold off on msg to EP scheduler pending monitor/follow-up. Pt already has f/u appt with Dr. Burt Knack on 3/30 which we will keep. Pt aware. ? ?Melina Copa PA-C ? ?

## 2021-03-21 NOTE — Progress Notes (Addendum)
? ?Progress Note ? ?Patient Name: Nicole Mercado ?Date of Encounter: 03/21/2021 ? ?Primary Cardiologist: Sherren Mocha, MD ? ?Subjective  ? ?Fortunately asymptomatic. Unfortunately HR remains poorly controlled this AM in the 100-120s. Orthostatic VS were positive yesterday (SBP 132->105). ? ?Inpatient Medications  ?  ?Scheduled Meds: ? apixaban  2.5 mg Oral BID  ? aspirin EC  81 mg Oral Daily  ? atorvastatin  10 mg Oral q1800  ? metoprolol tartrate  62.5 mg Oral BID  ? oxybutynin  5 mg Oral QHS  ? ?Continuous Infusions: ? ?PRN Meds: ?acetaminophen **OR** acetaminophen, ondansetron **OR** ondansetron (ZOFRAN) IV, polyethylene glycol  ? ?Vital Signs  ?  ?Vitals:  ? 03/20/21 1223 03/20/21 2015 03/21/21 0449 03/21/21 0500  ?BP: 129/71 138/71 133/81   ?Pulse: 88 90 100   ?Resp: '17 15 18   '$ ?Temp: 98.1 ?F (36.7 ?C) 97.7 ?F (36.5 ?C) 97.9 ?F (36.6 ?C)   ?TempSrc: Oral Oral Oral   ?SpO2: 98% 97% 97%   ?Weight:    43.1 kg  ?Height:      ? ? ?Intake/Output Summary (Last 24 hours) at 03/21/2021 0830 ?Last data filed at 03/21/2021 0510 ?Gross per 24 hour  ?Intake 975 ml  ?Output 850 ml  ?Net 125 ml  ? ?Last 3 Weights 03/21/2021 03/20/2021 03/19/2021  ?Weight (lbs) 95 lb 0.3 oz 95 lb 14.4 oz 92 lb  ?Weight (kg) 43.1 kg 43.5 kg 41.731 kg  ?  ? ?Telemetry  ?  ?AF RVR - Personally Reviewed ? ?Physical Exam  ? ?GEN: No acute distress.  ?HEENT: Normocephalic, atraumatic, sclera non-icteric. ?Neck: No JVD or bruits. ?Cardiac: Irregularly irrregular, rate elevated, no murmurs, rubs, or gallops.  ?Respiratory: Clear to auscultation bilaterally. Breathing is unlabored. ?GI: Soft, nontender, non-distended, BS +x 4. ?MS: no deformity. ?Extremities: No clubbing or cyanosis. No edema. Distal pedal pulses are 2+ and equal bilaterally. ?Neuro:  AAOx3. Follows commands. ?Psych:  Responds to questions appropriately with a normal affect. ? ?Labs  ?  ?High Sensitivity Troponin:   ?Recent Labs  ?Lab 03/19/21 ?1505 03/19/21 ?1705  ?TROPONINIHS 25* 26*  ?    ? ?Cardiac EnzymesNo results for input(s): TROPONINI in the last 168 hours. No results for input(s): TROPIPOC in the last 168 hours.  ? ?Chemistry ?Recent Labs  ?Lab 03/19/21 ?1505 03/20/21 ?0304 03/20/21 ?1035 03/21/21 ?0353  ?NA 132* 138  --  133*  ?K 3.3* 3.1* 3.4* 3.4*  ?CL 99 106  --  102  ?CO2 25 23  --  25  ?GLUCOSE 119* 111*  --  115*  ?BUN 28* 20  --  14  ?CREATININE 0.93 0.72  --  0.83  ?CALCIUM 9.0 8.1*  --  8.2*  ?PROT 5.8*  --   --  5.0*  ?ALBUMIN 3.3*  --   --  2.9*  ?AST 24  --   --  17  ?ALT 27  --   --  20  ?ALKPHOS 72  --   --  54  ?BILITOT 0.6  --   --  0.4  ?GFRNONAA >60 >60  --  >60  ?ANIONGAP 8 9  --  6  ?  ? ?Hematology ?Recent Labs  ?Lab 03/19/21 ?1459 03/20/21 ?0304 03/21/21 ?8756  ?WBC 12.1* 9.0 7.8  ?RBC 4.33 3.65* 3.71*  ?HGB 14.3 12.0 12.3  ?HCT 41.8 35.5* 36.4  ?MCV 96.5 97.3 98.1  ?MCH 33.0 32.9 33.2  ?MCHC 34.2 33.8 33.8  ?RDW 12.0 12.2 12.5  ?PLT 226 201  168  ? ? ?BNPNo results for input(s): BNP, PROBNP in the last 168 hours.  ? ?DDimer No results for input(s): DDIMER in the last 168 hours.  ? ?Radiology  ?  ?CT Angio Chest Pulmonary Embolism (PE) W or WO Contrast ? ?Result Date: 03/19/2021 ?CLINICAL DATA:  Syncope, simple, normal neuro exam EXAM: CT ANGIOGRAPHY CHEST WITH CONTRAST TECHNIQUE: Multidetector CT imaging of the chest was performed using the standard protocol during bolus administration of intravenous contrast. Multiplanar CT image reconstructions and MIPs were obtained to evaluate the vascular anatomy. RADIATION DOSE REDUCTION: This exam was performed according to the departmental dose-optimization program which includes automated exposure control, adjustment of the mA and/or kV according to patient size and/or use of iterative reconstruction technique. CONTRAST:  17m OMNIPAQUE IOHEXOL 350 MG/ML SOLN COMPARISON:  12/06/2018 FINDINGS: Cardiovascular: No filling defects in the pulmonary arteries to suggest pulmonary emboli. Mild cardiomegaly. Prior aortic valve repair and  CABG. Moderate aortic calcifications. No aneurysm. Mediastinum/Nodes: No mediastinal, hilar, or axillary adenopathy. Trachea and esophagus are unremarkable. Thyroid unremarkable. Lungs/Pleura: Trace left pleural effusion. No confluent opacities. No suspicious nodules. Upper Abdomen: Imaging into the upper abdomen demonstrates no acute findings. Musculoskeletal: Chest wall soft tissues are unremarkable. No acute bony abnormality. Review of the MIP images confirms the above findings. IMPRESSION: No evidence of pulmonary embolus. Cardiomegaly, prior CABG. Trace left pleural effusion. Aortic Atherosclerosis (ICD10-I70.0). Electronically Signed   By: KRolm BaptiseM.D.   On: 03/19/2021 17:48  ? ?DG Chest Port 1 View ? ?Result Date: 03/19/2021 ?CLINICAL DATA:  Syncope EXAM: PORTABLE CHEST 1 VIEW COMPARISON:  08/18/2020 FINDINGS: Single frontal view of the chest demonstrates postsurgical changes from CABG and aortic valve replacement. Cardiac silhouette is stable. No acute airspace disease, effusion, or pneumothorax. No acute bony abnormalities. IMPRESSION: 1. No acute intrathoracic process. Electronically Signed   By: MRanda NgoM.D.   On: 03/19/2021 15:54  ? ?ECHOCARDIOGRAM COMPLETE ? ?Result Date: 03/20/2021 ?   ECHOCARDIOGRAM REPORT   Patient Name:   Nicole PUTMANDate of Exam: 03/20/2021 Medical Rec #:  [id number]    Height:       60.0 in Accession #:    [id number]   Weight:       95.9 lb Date of Birth:  [birth year]   BSA:          1.366 m? Patient Age:    89years      BP:           147/63 mmHg Patient Gender: F             HR:           88 bpm. Exam Location:  Inpatient Procedure: Cardiac Doppler and Color Doppler Indications:    AF Syncope  History:        Patient has no prior history of Echocardiogram examinations.                 CAD, Arrythmias:Atrial Fibrillation, Signs/Symptoms:Murmur; Risk                 Factors:Hypertension. Bio-AVR and CABGx4 01/17/14.  Sonographer:    147 Referring Phys: [id number] 1. Left ventricular ejection fraction, by estimation, is 60 to 65%. The left ventricle has normal function. The left ventricle has no regional wall motion abnormalities. There is moderate left ventricular hypertrophy. Left ventricular diastolic parameters are indeterminate.  2. Right ventricular systolic function is mildly reduced. The right  ventricular size is normal. There is mildly elevated pulmonary artery systolic pressure. The estimated right ventricular systolic pressure is 35.3 mmHg.  3. The mitral valve is degenerative. Mild mitral valve regurgitation. Mild to moderate mitral stenosis. Moderate mitral annular calcification. MG 70mHg at 114 bpm.  4. Tricuspid valve regurgitation is moderate.  5. There is a 21 mm E[facility] IncEase valve present in the aortic position     Aortic valve regurgitation is trivial. Echo findings are consistent with normal structure and function of the aortic valve prosthesis. MG 984mg, unchanged from prior echo. Vmax 1.9 m/s, MG 84m11m, DI 0.42  6. The inferior vena cava is normal in size with <50% respiratory variability, suggesting right atrial pressure of 8 mmHg. FINDINGS  Left Ventricle: Left ventricular ejection fraction, by estimation, is 60 to 65%. The left ventricle has normal function. The left ventricle has no regional wall motion abnormalities. The left ventricular internal cavity size was small. There is moderate  left ventricular hypertrophy. Left ventricular diastolic parameters are indeterminate. Right Ventricle: The right ventricular size is normal. Right vetricular wall thickness was not well visualized. Right ventricular systolic function is mildly reduced. There is mildly elevated pulmonary artery systolic pressure. The tricuspid regurgitant velocity is 3.03 m/s, and with an assumed right atrial pressure of 8 mmHg, the estimated right ventricular systolic pressure is 44.61.4Hg. Left Atrium: Left atrial size was normal in size. Right Atrium: Right  atrial size was normal in size. Pericardium: There is no evidence of pericardial effusion. Mitral Valve: The mitral valve is degenerative in appearance. Moderate mitral annular calcification. Mild mit

## 2021-03-21 NOTE — Discharge Summary (Signed)
Physician Discharge Summary  ?Nicole Mercado UJW:[id number] DOB: 09-02-1940 DOA: 03/19/2021 ? ?PCP: Kristie Cowman, MD ? ?Admit date: 03/19/2021 ?Discharge date: 03/21/2021 ? ?Time spent: 35 minutes ? ?Recommendations for Outpatient Follow-up:  ? ?Atrial fibrillation with RVR ([place]) ?-With a CHA2DS2-VASc score of at least 6. ?-start her on IV diltiazem drip try to keep her heart less than 100. ?-Due to her history of traveling in a car for more than 4 hours with minimal hydration and as PE being is one of the causes or triggers of A-fib we will go ahead and get a CT angio of the chest to rule out a PE.  (Negative) see results below ?-We will hydrate to minimize acute kidney injury. ?-Cardiology has been consulted. ?The echo less than a year ago showed an EF of 56% with diastolic dysfunction ?-Discharged on Eliquis ?  ?Syncope and collapse ?-CT angio of the chest; negative PE ?-3/14 Cardizem drip started secondary to A-fib with RVR ?-3/15 patient meets guidelines for Orthostatic Hypotensive ?-3/15 DC Cardizem drip ?-3/15 AM Cortisol WNL ?- 3/15 echocardiogram EF 60 to 65% moderate LVH seen results below ?-3/16 bolus 1 L normal saline prior to discharge ?-Metoprolol 75 mg BID ?  ?Essential hypertension/Orthostatic Hypotension ?-See syncope and collapse ?  ?Aortic stenosis/ S/P AVR ?On aspirin. ?  ?CKD (chronic kidney disease), stage III a ?Lab Results  ?Component Value Date  ? CREATININE 0.83 03/21/2021  ? CREATININE 0.72 03/20/2021  ? CREATININE 0.93 03/19/2021  ? CREATININE 1.08 (H) 11/30/2020  ? CREATININE 1.17 (H) 08/18/2020  ?-Cr within normal limits ? ?  ?CLL (chronic lymphocytic leukemia) ([place]) ?Cell lineage shows a white count of 12 she has remained afebrile, hemoglobin of 14 and a platelet count of 226. ?-3/15 Per Dr. Alvy Bimler oncology hold patient's chemo Calquence.   ?-3/16 per Dr. Alvy Bimler oncology DO NOT restart chemo Calquence. ?-Follow-up Dr. Alvy Bimler oncology on 3/27  ?  ?PAD (peripheral artery disease)  ([place]) ?Continue aspirin. ?  ?Hypokalemia ?-Potassium goal> 4 ?- 3/16 K-Dur 50 meq  ?  ?  ?Hypophosphatemia ?- Phosphorus goal> 2.5 ?- K-Phos 30 mmol ?  ?Magnesium ?- Magnesium goal> 2 ?- 3/15 Magnesium 80 4 g ? ? ? ?Discharge Diagnoses:  ?Principal Problem: ?  Atrial fibrillation with RVR ([place]) ?Active Problems: ?  Syncope and collapse ?  Essential hypertension ?  Aortic stenosis ?  S/P AVR ?  CKD (chronic kidney disease), stage III ([place]) ?  CLL (chronic lymphocytic leukemia) ([place]) ?  PAD (peripheral artery disease) ([place]) ? ? ?Discharge Condition: Stable ? ?Diet recommendation: Heart healthy ? ?Filed Weights  ? 03/19/21 1452 03/20/21 0408 03/21/21 0500  ?Weight: 41.7 kg 43.5 kg 43.1 kg  ? ? ?History of present illness:  ?81 y.o. WF PMHx Aortic stenosis status post aortic valve replacement on 01/17/2014, CAD STEMI in 2016 underwent CABG during that time, carotid artery stenosis status post left carotid endarterectomy, essential hypertension and CLL  ?  ?into the hospital for 2 syncopal episodes that happened the day of admission, the first when she was standing up she felt lightheaded but did not fall completely.  Shortly thereafter she attempted to stand up and passed out completely unconscious to the floor did not hit her head only lost consciousness for several seconds, she denies any nausea vomiting fever upper respiratory tract infections.  The patient relates she drove back and forth to from [place] to [facility] back and forth on the same day without stopping and not keeping herself  hydrated. ?  ?In the ED: ?She was found to be hyponatremic hypokalemic, with a mild leukocytosis, SARS-CoV-2 influenza PCR were negative, chest x-ray showed no infiltrates twelve-lead EKG showed A-fib with RVR ? ? ?Hospital Course:  ?See above ? ? ?Procedures: ?3/14 CTA chest PE protocol negative for PE ?3/15 Echocardiogram  ?Left Ventricle: LVEF= 60 to 65%. Moderate LVH ?- Left ventricular diastolic parameters are  indeterminate.  ?Right Ventricle: Mildly elevated pulmonary artery systolic pressure..Right ventricular systolic pressure is 73.2 mmHg.  ?Mitral Valve: Mild to moderate mitral valve stenosis. ?Tricuspid Valve: regurgitation is moderate.  ?Aortic Valve: The aortic valve has been repaired/replaced. There is a 21 mm Edwards  ?Magna Ease valve present I ? ?Consultations: ?Cardiology ? ? ?Cultures  ?3/14 influenza A/B negative ?3/14 SARS coronavirus negative ? ? ?Antibiotics ?Anti-infectives (From admission, onward)  ? ? None  ? ?  ?  ? ? ?Discharge Exam: ?Vitals:  ? 03/21/21 0449 03/21/21 0500 03/21/21 1227 03/21/21 1350  ?BP: 133/81   (!) 154/89  ?Pulse: 100   68  ?Resp: 18   18  ?Temp: 97.9 ?F (36.6 ?C)   97.8 ?F (36.6 ?C)  ?TempSrc: Oral   Oral  ?SpO2: 97%  100% 99%  ?Weight:  43.1 kg    ?Height:      ? ? ?General: A/O x4, No acute respiratory distress, cachectic ?Eyes: negative scleral hemorrhage, negative anisocoria, negative icterus ?ENT: Negative Runny nose, negative gingival bleeding, ?Neck:  Negative scars, masses, torticollis, lymphadenopathy, JVD ?Lungs: Clear to auscultation bilaterally without wheezes or crackles ?Cardiovascular: Regular rate and rhythm without murmur gallop or rub normal S1 and S2 ? ?Discharge Instructions ? ? ?Allergies as of 03/21/2021   ?No Known Allergies ?  ? ?  ?Medication List  ?  ? ?STOP taking these medications   ? ?amLODipine 5 MG tablet ?Commonly known as: NORVASC ?  ?amoxicillin 500 MG capsule ?Commonly known as: AMOXIL ?  ?Calquence 100 MG Tabs ?Generic drug: Acalabrutinib Maleate ?  ?metoprolol succinate 50 MG 24 hr tablet ?Commonly known as: TOPROL-XL ?  ? ?  ? ?TAKE these medications   ? ?acetaminophen 325 MG tablet ?Commonly known as: TYLENOL ?Take 650 mg by mouth every 6 (six) hours as needed for mild pain or headache. ?  ?apixaban 2.5 MG Tabs tablet ?Commonly known as: ELIQUIS ?Take 1 tablet (2.5 mg total) by mouth 2 (two) times daily. ?  ?aspirin 81 MG tablet ?Take 81 mg  by mouth daily. ?  ?atorvastatin 10 MG tablet ?Commonly known as: LIPITOR ?TAKE 1 TABLET BY MOUTH ONCE DAILY AT  6  IN  THE  EVENING ?What changed: See the new instructions. ?  ?Calcium Carb-Cholecalciferol 500-125 MG-UNIT Tabs ?Take 1 tablet by mouth daily. ?  ?HM MULTIVITAMIN ADULT GUMMY PO ?Take 1 tablet by mouth daily. ?  ?Metoprolol Tartrate 75 MG Tabs ?Take 75 mg by mouth 2 (two) times daily. ?  ?oxybutynin 5 MG 24 hr tablet ?Commonly known as: DITROPAN-XL ?Take 5 mg by mouth at bedtime. ?  ?oxybutynin 10 MG 24 hr tablet ?Commonly known as: DITROPAN-XL ?Take 10 mg by mouth daily. ?  ? ?  ? ?No Known Allergies ? ? ? ?The results of significant diagnostics from this hospitalization (including imaging, microbiology, ancillary and laboratory) are listed below for reference.   ? ?Significant Diagnostic Studies: ?CT Angio Chest Pulmonary Embolism (PE) W or WO Contrast ? ?Result Date: 03/19/2021 ?CLINICAL DATA:  Syncope, simple, normal neuro exam EXAM: CT ANGIOGRAPHY CHEST WITH  CONTRAST TECHNIQUE: Multidetector CT imaging of the chest was performed using the standard protocol during bolus administration of intravenous contrast. Multiplanar CT image reconstructions and MIPs were obtained to evaluate the vascular anatomy. RADIATION DOSE REDUCTION: This exam was performed according to the departmental dose-optimization program which includes automated exposure control, adjustment of the mA and/or kV according to patient size and/or use of iterative reconstruction technique. CONTRAST:  5m OMNIPAQUE IOHEXOL 350 MG/ML SOLN COMPARISON:  12/06/2018 FINDINGS: Cardiovascular: No filling defects in the pulmonary arteries to suggest pulmonary emboli. Mild cardiomegaly. Prior aortic valve repair and CABG. Moderate aortic calcifications. No aneurysm. Mediastinum/Nodes: No mediastinal, hilar, or axillary adenopathy. Trachea and esophagus are unremarkable. Thyroid unremarkable. Lungs/Pleura: Trace left pleural effusion. No  confluent opacities. No suspicious nodules. Upper Abdomen: Imaging into the upper abdomen demonstrates no acute findings. Musculoskeletal: Chest wall soft tissues are unremarkable. No acute bony abnormality. Review of the

## 2021-03-21 NOTE — Discharge Instructions (Signed)

## 2021-03-21 NOTE — Progress Notes (Signed)
Orthostatics still positive. Will start 12 hr of IV fluids. ?

## 2021-03-21 NOTE — Progress Notes (Incomplete)
?PROGRESS NOTE ? ? ? ?Nicole Mercado  WTU:[id number] DOB: 1940-03-16 DOA: 03/19/2021 ?PCP: Kristie Cowman, MD  ? ? ? ?Brief Narrative:  ?81 y.o. WF PMHx Aortic stenosis status post aortic valve replacement on 01/17/2014, CAD STEMI in 2016 underwent CABG during that time, carotid artery stenosis status post left carotid endarterectomy, essential hypertension and CLL  ? ?into the hospital for 2 syncopal episodes that happened the day of admission, the first when she was standing up she felt lightheaded but did not fall completely.  Shortly thereafter she attempted to stand up and passed out completely unconscious to the floor did not hit her head only lost consciousness for several seconds, she denies any nausea vomiting fever upper respiratory tract infections.  The patient relates she drove back and forth to from [place] to [facility] back and forth on the same day without stopping and not keeping herself hydrated. ?  ?In the ED: ?She was found to be hyponatremic hypokalemic, with a mild leukocytosis, SARS-CoV-2 influenza PCR were negative, chest x-ray showed no infiltrates twelve-lead EKG showed A-fib with RVR ? ? ?Subjective: ?3/16 afebrile overnight ? ? ?A/O x4, patient remains orthostatic.  Patient not sure if she was symptomatic  ? ? ? ?Assessment & Plan: ?Covid vaccination; ?  ?Principal Problem: ?  Atrial fibrillation with RVR ([place]) ?Active Problems: ?  Syncope and collapse ?  Essential hypertension ?  Aortic stenosis ?  S/P AVR ?  CKD (chronic kidney disease), stage III ([place]) ?  CLL (chronic lymphocytic leukemia) ([place]) ?  PAD (peripheral artery disease) ([place]) ? ? ? Atrial fibrillation with RVR ([place]) ?-With a CHA2DS2-VASc score of at least 6. ?-start her on IV diltiazem drip try to keep her heart less than 100. ?-start her on IV heparin. ?-Due to her history of traveling in a car for more than 4 hours with minimal hydration and as PE being is one of the causes or triggers of A-fib we will go ahead and  get a CT angio of the chest to rule out a PE.  (Negative) see results below ?-We will hydrate to minimize acute kidney injury. ?-Cardiology has been consulted. ?The echo less than a year ago showed an EF of 17% with diastolic dysfunction ?Her platelet count is greater than 50,000 she has no contraindication to anticoagulation or antiplatelet therapy at this point in time her platelet count was greater than 200k on admission. ?Check a TSH. ?-It is noted as per patient she has never had a colonoscopy. ?  ?Syncope and collapse ?Likely due to above admitted to telemetry. ?CT angio of the chest has been ordered. ?-3/14 Cardizem drip started secondary to A-fib with RVR ?-3/15 patient meets guidelines for Orthostatic Hypotensive ?-3/15 DC Cardizem drip ?- 3/15 increase Metoprolol 62.5 mg BID ?-3/15 AM Cortisol WNL ?-3/15 Echocardiogram pending ?  ?Essential hypertension ?-See syncope and collapse ? ?Aortic stenosis/ S/P AVR ?On aspirin. ?  ?CKD (chronic kidney disease), stage III a ?Creatinine at baseline continue to monitor. ?  ?CLL (chronic lymphocytic leukemia) ([place]) ?Cell lineage shows a white count of 12 she has remained afebrile, hemoglobin of 14 and a platelet count of 226. ?-3/15 Per Dr. Alvy Bimler oncology hold patient's chemo [place].  Contact Dr. Alvy Bimler oncology to determine if she wants patient to restart on discharge or hold until her follow-up next week. ?-Follow-up Dr. Alvy Bimler oncology on 3/27  ?  ?PAD (peripheral artery disease) ([place]) ?Continue aspirin. ? ?Hypokalemia******** ?-Potassium goal> 4 ?- 3/16 K-Dur 50 meq  ? ? ?  Hypophosphatemia ?- Phosphorus goal> 2.5 ?- K-Phos 30 mmol ? ?Magnesium ?- Magnesium goal> 2 ?- 3/15 Magnesium 80 4 g ? ? ? ? ? ?  ? ? ?  ?Body mass index is 18.56 kg/m?. ? ? ? .[id number] ? ?DVT prophylaxis:  ?Code Status:  ?Family Communication:  ?Status is: Inpatient ? ? ? ?Dispo: The patient is from:  ?             Anticipated d/c is to:  ?             Anticipated d/c date is:  ?              Patient currently  ? ? ? ? ? ?Consultants:  ?Cardiology ? ? ?Procedures/Significant Events:  ?3/15 Echocardiogram  ?Left Ventricle: LVEF= 60 to 65%. Moderate LVH ?- Left ventricular diastolic parameters are indeterminate.  ?Right Ventricle: Mildly elevated pulmonary artery systolic pressure..Right ventricular systolic pressure is 30.0 mmHg.  ?Mitral Valve: Mild to moderate mitral valve stenosis. ?Tricuspid Valve: regurgitation is moderate.  ?Aortic Valve: The aortic valve has been repaired/replaced. There is a 21 mm Edwards  ?Magna Ease valve present I ? ? ? ? ?I have personally reviewed and interpreted all radiology studies and my findings are as above. ? ?VENTILATOR SETTINGS: ? ? ? ?Cultures ? ? ?Antimicrobials: ? ? ? ?Devices ?  ? ?LINES / TUBES:  ? ? ? ? ?Continuous Infusions: ? ? ? ? ?Objective: ?Vitals:  ? 03/20/21 1223 03/20/21 2015 03/21/21 0449 03/21/21 0500  ?BP: 129/71 138/71 133/81   ?Pulse: 88 90 100   ?Resp: '17 15 18   '$ ?Temp: 98.1 ?F (36.7 ?C) 97.7 ?F (36.5 ?C) 97.9 ?F (36.6 ?C)   ?TempSrc: Oral Oral Oral   ?SpO2: 98% 97% 97%   ?Weight:    43.1 kg  ?Height:      ? ? ?Intake/Output Summary (Last 24 hours) at 03/21/2021 0831 ?Last data filed at 03/21/2021 0510 ?Gross per 24 hour  ?Intake 975 ml  ?Output 850 ml  ?Net 125 ml  ? ? ?Filed Weights  ? 03/19/21 1452 03/20/21 0408 03/21/21 0500  ?Weight: 41.7 kg 43.5 kg 43.1 kg  ? ? ?Examination: ? ?General: A/O x4, No acute respiratory distress, cachectic ?Eyes: negative scleral hemorrhage, negative anisocoria, negative icterus ?ENT: Negative Runny nose, negative gingival bleeding, ?Neck:  Negative scars, masses, torticollis, lymphadenopathy, JVD ?Lungs: Clear to auscultation bilaterally without wheezes or crackles ?Cardiovascular: Regular rate and rhythm without murmur gallop or rub normal S1 and S2 ?Abdomen: negative abdominal pain, nondistended, positive soft, bowel sounds, no rebound, no ascites, no appreciable mass ?Extremities: No significant  cyanosis, clubbing, or edema bilateral lower extremities ?Skin: Negative rashes, lesions, ulcers ?Psychiatric:  Negative depression, negative anxiety, negative fatigue, negative mania  ?Central nervous system:  Cranial nerves II through XII intact, tongue/uvula midline, all extremities muscle strength 5/5, sensation intact throughout, negative dysarthria, negative expressive aphasia, negative receptive aphasia. ? ?.  ? ? ? ?Data Reviewed: Care during the described time interval was provided by me .  I have reviewed this patient's available data, including medical history, events of note, physical examination, and all test results as part of my evaluation. ? ?CBC: ?Recent Labs  ?Lab 03/19/21 ?1459 03/20/21 ?0304 03/21/21 ?9233  ?WBC 12.1* 9.0 7.8  ?HGB 14.3 12.0 12.3  ?HCT 41.8 35.5* 36.4  ?MCV 96.5 97.3 98.1  ?PLT 226 201 168  ? ? ?Basic Metabolic Panel: ?Recent Labs  ?Lab 03/19/21 ?1505 03/20/21 ?0304 03/20/21 ?1035 03/21/21 ?  0353  ?NA 132* 138  --  133*  ?K 3.3* 3.1* 3.4* 3.4*  ?CL 99 106  --  102  ?CO2 25 23  --  25  ?GLUCOSE 119* 111*  --  115*  ?BUN 28* 20  --  14  ?CREATININE 0.93 0.72  --  0.83  ?CALCIUM 9.0 8.1*  --  8.2*  ?MG  --   --  1.5* 2.2  ?PHOS  --   --  1.9* 3.0  ? ? ?GFR: ?Estimated Creatinine Clearance: 36.8 mL/min (by C-G formula based on SCr of 0.83 mg/dL). ?Liver Function Tests: ?Recent Labs  ?Lab 03/19/21 ?1505 03/21/21 ?0353  ?AST 24 17  ?ALT 27 20  ?ALKPHOS 72 54  ?BILITOT 0.6 0.4  ?PROT 5.8* 5.0*  ?ALBUMIN 3.3* 2.9*  ? ? ?No results for input(s): LIPASE, AMYLASE in the last 168 hours. ?No results for input(s): AMMONIA in the last 168 hours. ?Coagulation Profile: ?Recent Labs  ?Lab 03/19/21 ?1705 03/20/21 ?0304  ?INR 1.0 1.1  ? ? ?Cardiac Enzymes: ?No results for input(s): CKTOTAL, CKMB, CKMBINDEX, TROPONINI in the last 168 hours. ?BNP (last 3 results) ?No results for input(s): PROBNP in the last 8760 hours. ?HbA1C: ?No results for input(s): HGBA1C in the last 72 hours. ?CBG: ?Recent Labs   ?Lab 03/19/21 ?1601  ?GLUCAP 108*  ? ? ?Lipid Profile: ?No results for input(s): CHOL, HDL, LDLCALC, TRIG, CHOLHDL, LDLDIRECT in the last 72 hours. ?Thyroid Function Tests: ?Recent Labs  ?  03/20/21 ?0304

## 2021-03-25 ENCOUNTER — Encounter: Payer: Self-pay | Admitting: Interventional Cardiology

## 2021-03-25 ENCOUNTER — Other Ambulatory Visit: Payer: Self-pay

## 2021-03-25 ENCOUNTER — Ambulatory Visit: Payer: Medicare Other | Admitting: Interventional Cardiology

## 2021-03-25 ENCOUNTER — Telehealth: Payer: Self-pay | Admitting: Cardiovascular Disease

## 2021-03-25 VITALS — BP 150/80 | HR 93 | Ht 60.0 in | Wt 98.0 lb

## 2021-03-25 DIAGNOSIS — Z952 Presence of prosthetic heart valve: Secondary | ICD-10-CM | POA: Diagnosis not present

## 2021-03-25 DIAGNOSIS — D6869 Other thrombophilia: Secondary | ICD-10-CM

## 2021-03-25 DIAGNOSIS — I25118 Atherosclerotic heart disease of native coronary artery with other forms of angina pectoris: Secondary | ICD-10-CM | POA: Diagnosis not present

## 2021-03-25 DIAGNOSIS — I4891 Unspecified atrial fibrillation: Secondary | ICD-10-CM

## 2021-03-25 DIAGNOSIS — I10 Essential (primary) hypertension: Secondary | ICD-10-CM

## 2021-03-25 MED ORDER — METOPROLOL TARTRATE 100 MG PO TABS: 100.0000 mg | ORAL_TABLET | Freq: Two times a day (BID) | ORAL | 3 refills | Status: AC

## 2021-03-25 NOTE — Telephone Encounter (Signed)
STAT if patient feels like he/she is going to faint  ? ?Are you dizzy now? Yes, when she stands up- this been going on since last Wednesday ? ?Do you feel faint or have you passed out? Feels like sometimes she feels like she is going  to faint ? ?Do you have any other symptoms? No other symptoms ? ?Have you checked your HR and BP (record if available)?  Blood pressure today is 135/118 , 151/96 heart rate is 92-   patient wants to be seen- I made her an appointment with Laurann Montana on Wednesday- please call to evaluate ? ?

## 2021-03-25 NOTE — Patient Instructions (Addendum)
Medication Instructions:  ?Your physician has recommended you make the following change in your medication: Increase metoprolol tartrate to 100 mg by mouth twice daily ? ?*If you need a refill on your cardiac medications before your next appointment, please call your pharmacy* ? ? ?Lab Work: ?None today ?If you have labs (blood work) drawn today and your tests are completely normal, you will receive your results only by: ?MyChart Message (if you have MyChart) OR ?A paper copy in the mail ?If you have any lab test that is abnormal or we need to change your treatment, we will call you to review the results. ? ? ?Testing/Procedures: ?Your physician has recommended that you have a Cardioversion (DCCV). Electrical Cardioversion uses a jolt of electricity to your heart either through paddles or wired patches attached to your chest. This is a controlled, usually prescheduled, procedure. Defibrillation is done under light anesthesia in the hospital, and you usually go home the day of the procedure. This is done to get your heart back into a normal rhythm. You are not awake for the procedure. Please see the instruction sheet given to you today. ?Scheduled for April 22, 2021 ? ? ?Follow-Up: ?At [facility], you and your health needs are our priority.  As part of our continuing mission to provide you with exceptional heart care, we have created designated Provider Care Teams.  These Care Teams include your primary Cardiologist (physician) and Advanced Practice Providers (APPs -  Physician Assistants and Nurse Practitioners) who all work together to provide you with the care you need, when you need it. ? ?We recommend signing up for the patient portal called "MyChart".  Sign up information is provided on this After Visit Summary.  MyChart is used to connect with patients for Virtual Visits (Telemedicine).  Patients are able to view lab/test results, encounter notes, upcoming appointments, etc.  Non-urgent messages can be  sent to your provider as well.   ?To learn more about what you can do with MyChart, go to [email or web address].   ? ?Your next appointment:   ?April 04, 2021 ? ?The format for your next appointment:   ?In Person ? ?Provider:   ?Sherren Mocha, MD   ? ? ?Other Instructions ?  ?  ?You are scheduled for a Cardioversion on April 22, 2021 with Dr. Gasper Sells.  Please arrive at the [facility] (Main Entrance A) at [facility]: [address] [place], [place] [address] at 6:30 am  ? ?DIET: Nothing to eat or drink after midnight except a sip of water with medications (see medication instructions below) ? ?FYI: For your safety, and to allow [place] to monitor your vital signs accurately during the surgery/procedure we request that   ?if you have artificial nails, gel coating, SNS etc. Please have those removed prior to your surgery/procedure. Not having the nail coverings /polish removed may result in cancellation or delay of your surgery/procedure. ? ? ?Medication Instructions: ? ? ?Continue your anticoagulant: Eliquis ?You will need to continue your anticoagulant after your procedure until you  are told by your  ?Provider that it is safe to stop ? ? ? ?  your lab work will be done at the hospital prior to your procedure - ?You must have a responsible person to drive you home and stay in the waiting area during your procedure. Failure to do so could result in cancellation. ? ?Interior and spatial designer cards. ? ?*Special Note: Every effort is made to have your procedure done on time. Occasionally there  are emergencies that occur at the hospital that may cause delays. Please be patient if a delay does occur.   ?  ?

## 2021-03-25 NOTE — Progress Notes (Signed)
?  ?Cardiology Office Note ? ? ?Date:  03/25/2021  ? ?ID:  Nicole Mercado, DOB Jul 30, 1940, MRN [id number] ? ?PCP:  Kristie Cowman, MD  ? ? ?No chief complaint on file. ? ?AFib ? ?Wt Readings from Last 3 Encounters:  ?03/25/21 98 lb (44.5 kg)  ?03/21/21 95 lb 0.3 oz (43.1 kg)  ?11/30/20 93 lb 9.6 oz (42.5 kg)  ?  ? ?  ?History of Present Illness: ?Nicole Mercado is a 81 y.o. female  with:  ?Coronary artery disease + Aortic stenosis ?S/p CABG+AVR in 1/16 ?Post op AFib  ?Hypertension  ?Carotid artery disease  ?s/p L CEA in 6/13 ?Hyperlipidemia  ?Aortic atherosclerosis (CT 11/2018) ?Chronic Lymphocytic Leukemia  (Dr. Alvy Bimler) ?Chronic thrombocytopenia  ?  ?Prior CV Studies: ?Echocardiogram 08/20/20 ?EF >75, hyperdynamic LV function, no RWMA, normal RVSF, mild LAE, mild MR, mild-moderate TR, AVR functioning normally with mean gradient 8 mmHg and mild AI, RVSP 40.5 ?  ?CARDIAC TELEMETRY MONITORING- OFFICE PLACED 08/20/2020 ?Narrative ?1. The basic rhythm is normal sinus with an average HR of 71 bpm ?2. Possible atrial fibrillation with a very low burden over a total of 3 hours during the entire monitoring period, baseline artifact makes interpretation difficult ?3. No high-grade heart block or pathologic pauses ?4. There are occasional PVC's and occasional supraventricular beats ?5. No sustained ventricular arrhythmia ?  ? ?Syncope in 08/2020. ? ?Stress test in 1022 showed: ?"The study is normal. The study is low risk. ?  No ST deviation was noted. ?  LV perfusion is abnormal. Defect 1: There is a small defect with mild reduction in uptake present in the mid anterolateral location(s) that is fixed. There is normal wall motion in the defect area. Consistent with artifact. ?  Left ventricular function is normal. End diastolic cavity size is normal. ?  Prior study not available for comparison." ? ?Echocardiogram in March 2023 showed: ?"Left ventricular ejection fraction, by estimation, is 60 to 65%. The  ?left ventricle has  normal function. The left ventricle has no regional  ?wall motion abnormalities. There is moderate left ventricular hypertrophy.  ?Left ventricular diastolic  ?parameters are indeterminate.  ? 2. Right ventricular systolic function is mildly reduced. The right  ?ventricular size is normal. There is mildly elevated pulmonary artery  ?systolic pressure. The estimated right ventricular systolic pressure is  ?95.2 mmHg.  ? 3. The mitral valve is degenerative. Mild mitral valve regurgitation.  ?Mild to moderate mitral stenosis. Moderate mitral annular calcification.  ?MG 11mHg at 114 bpm.  ? 4. Tricuspid valve regurgitation is moderate.  ? 5. There is a 21 mm EBig Lotsvalve present in the aortic  ?position  ?    Aortic valve regurgitation is trivial. Echo findings are consistent  ?with normal structure and function of the aortic valve prosthesis. MG  ?953mg, unchanged from prior echo. Vmax 1.9 m/s, MG 53m52m, DI 0.42  ? 6. The inferior vena cava is normal in size with <50% respiratory  ?variability, suggesting right atrial pressure of 8 mmHg. " ?Past Medical History:  ?Diagnosis Date  ? Aortic stenosis   ? a. Echocardiogram (01/11/14):  Mild LVH, EF 60-65%, Gr 1 DD, possible bicuspid AV, severe AS (mean 61 mmHg, peak 104 mmHg), mild MVP of post leaflet, mild MR, PASP 33 mmHg.;  b. 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 Arthritis   ? [place] [facility] ? post op after CABG+AVR >> Amiodarone  ? CAD (coronary artery disease)   ? a.  LHC (01/13/14):  dLM 70 extending into oLAD, mLAD 40-50, pD1 80, pD2 70, ostial/prox OM1 70 >> CABG (L-LAD, S-OM1, S-D1, S-D2)  // Myoview 10/22: Fixed defect anterolateral wall consistent with artifact, EF 71, no ischemia, low risk  ? Carotid artery occlusion   ? a. s/p L CEA 2013;  b.  Carotid US (1/16):  Bilateral ICA 1-39% // Carotid US 10/22: Bilateral ICA 1-39  ? CLL (chronic lymphocytic leukemia) ([place])   ? slow leukemia---Dr  Murinson  ? H/O exercise stress test   ? NSSTT  ? Hx of  cardiovascular stress test   ? a. Nuclear stress test That (4/13): Normal perfusion, EF 74%  ? Hx of echocardiogram   ? Echo (2/16):  Mild LVH, EF 60-65%, no RWMA, Gr 2 DD, AVR ok (mean 9 mmHg), trivial AI, mild MR, mild LAE, PASP 40 mmHg  ? Hypertension   ? PAD (peripheral artery disease) ([place])   ? LE Arterial US 10/22: R - pCFA, oSFA, dSFA 30-49; L - pCFA + mSFA 50-74, pPop 30-49 >> referred to [facility]  ? Pancreatitis   ? h/o  ? Thrombocytopenia ([place]) 11/19/2010  ? ? ?Past Surgical History:  ?Procedure Laterality Date  ? ABDOMINAL HYSTERECTOMY    ? AORTIC VALVE REPLACEMENT [name] 01/17/2014  ? Procedure: AORTIC VALVE REPLACEMENT (AVR);  Surgeon: Gaye Pollack, MD;  Location: [facility];  Service: Open Heart Surgery;  Laterality: [name];  ? APPENDECTOMY    ? CAROTID ENDARTERECTOMY  06/09/11  ? LEFT  cea  ? CESAREAN SECTION    ? FIVE  ? CHOLECYSTECTOMY    ? CORONARY ARTERY BYPASS GRAFT [name] 01/17/2014  ? Procedure: CORONARY ARTERY BYPASS GRAFTING (CABG)TIMES FOUR USING LEFT INTERNAL MAMMARY ARTERY AND RIGHT SAPHENOUS VEIN HARVESTED ENDOSCOPICALLY;  Surgeon: Gaye Pollack, MD;  Location: [facility];  Service: Open Heart Surgery;  Laterality: [name];  ? ENDARTERECTOMY  06/09/2011  ? Procedure: ENDARTERECTOMY CAROTID;  Surgeon: Rosetta Posner, MD;  Location: [facility] OR;  Service: Vascular;  Laterality: Left;  left carotid endarterectomy with patch angioplasty  ? LEFT AND RIGHT HEART CATHETERIZATION WITH CORONARY ANGIOGRAM [name] 01/13/2014  ? Procedure: LEFT AND RIGHT HEART CATHETERIZATION WITH CORONARY ANGIOGRAM;  Surgeon: Jettie Booze, MD;  Location: [facility] CATH LAB;  Service: Cardiovascular;  Laterality: [name];  ? TEE WITHOUT CARDIOVERSION [name] 01/17/2014  ? Procedure: TRANSESOPHAGEAL ECHOCARDIOGRAM (TEE);  Surgeon: Gaye Pollack, MD;  Location: [facility];  Service: Open Heart Surgery;  Laterality: [name];  ? TONSILLECTOMY    ? ? ? ?Current Outpatient Medications  ?Medication Sig Dispense Refill  ? acetaminophen (TYLENOL) 325 MG tablet Take 650 mg by mouth every  6 (six) hours as needed for mild pain or headache.     ? apixaban (ELIQUIS) 2.5 MG TABS tablet Take 1 tablet (2.5 mg total) by mouth 2 (two) times daily. 60 tablet 0  ? aspirin 81 MG tablet Take 81 mg by mouth daily.    ? atorvastatin (LIPITOR) 10 MG tablet TAKE 1 TABLET BY MOUTH ONCE DAILY AT  6  IN  THE  EVENING (Patient taking differently: Take 10 mg by mouth daily.) 90 tablet 2  ? Calcium Carb-Cholecalciferol 500-125 MG-UNIT TABS Take 1 tablet by mouth daily.    ? Metoprolol Tartrate 75 MG TABS Take 75 mg by mouth 2 (two) times daily. 60 tablet 0  ? Multiple Vitamins-Minerals (HM MULTIVITAMIN ADULT GUMMY PO) Take 1 tablet by mouth daily.    ? oxybutynin (DITROPAN-XL) 10 MG 24 hr  tablet Take 10 mg by mouth daily.    ? oxybutynin (DITROPAN-XL) 5 MG 24 hr tablet Take 5 mg by mouth at bedtime.    ? ?No current facility-administered medications for this visit.  ? ? ?Allergies:   Patient has no known allergies.  ? ? ?Social History:  The patient  reports that she has never smoked. She has never used smokeless tobacco. She reports that she does not drink alcohol and does not use drugs.  ? ?Family History:  The patient's family history includes Heart attack in her mother; Heart disease in her father and mother; Hypertension in her father, mother, sister, and son; Stroke in her paternal grandfather and paternal grandmother.  ? ? ?ROS:  Please see the history of present illness.   Otherwise, review of systems are positive for dizziness  All other systems are reviewed and negative.  ? ? ?PHYSICAL EXAM: ?VS:  BP (!) 150/80   Pulse 93   Ht 5' (1.524 m)   Wt 98 lb (44.5 kg)   SpO2 95%   BMI 19.14 kg/m?  , BMI Body mass index is 19.14 kg/m?. ?GEN: Well nourished, well developed, in no acute distress ?HEENT: normal ?Neck: no JVD, carotid bruits, or masses ?Cardiac: irregularly irregularly, tachycardic; no murmurs, rubs, or gallops,no edema  ?Respiratory:  clear to auscultation bilaterally, normal work of breathing ?GI:  soft, nontender, nondistended, + BS ?MS: no deformity or atrophy ?Skin: warm and dry, no rash ?Neuro:  Strength and sensation are intact ?Psych: euthymic mood, full affect ? ? ?EKG:   ?The ekg ordered today demons

## 2021-03-25 NOTE — Telephone Encounter (Signed)
Patient has been have dizziness since 03/20/21. The patient has been ordered a heart monitor post hospital visit for syncope and dizziness. Noted to have Afib RVR at ER. The patient has not missed any medication including new meds started in hospital Metoprolol tartrate 75 BID and Eliquis 2.5 mg BID. The patient does not take he BP or HR regularly. This morning reading is BP 146/87 HR 125. She took her meds ~30 mins to 1 hour ago. Gave ED precautions. Verbalized understanding.  ? ?Will forward to Dr. Burt Knack and his RN for advisement.  ?

## 2021-03-25 NOTE — Telephone Encounter (Signed)
Called and spoke to patient who verified symptoms are still currently happening. Pt states her current HR is 125 via her at home monitor. Pt was initially scheduled for this Wednesday (3/22) with Laurann Montana, but pt is still currently symptomatic and dizzy. Arranged for pt to come in today to see our DOD (varanasi) due to her symptoms and fact that she has taken her medications with no relief. Denies CP or feeling that she should go to ED. ?

## 2021-03-27 ENCOUNTER — Ambulatory Visit (HOSPITAL_BASED_OUTPATIENT_CLINIC_OR_DEPARTMENT_OTHER): Payer: Medicare Other | Admitting: Family

## 2021-03-30 ENCOUNTER — Other Ambulatory Visit: Payer: Self-pay

## 2021-03-30 ENCOUNTER — Emergency Department (HOSPITAL_COMMUNITY): Payer: Medicare Other

## 2021-03-30 ENCOUNTER — Inpatient Hospital Stay (HOSPITAL_COMMUNITY)
Admission: EM | Admit: 2021-03-30 | Discharge: 2021-04-05 | DRG: 069 | Disposition: A | Discharge status: Skilled Nursing Facility | Payer: Medicare Other | Attending: Internal Medicine | Admitting: Internal Medicine

## 2021-03-30 ENCOUNTER — Encounter (HOSPITAL_COMMUNITY): Payer: Self-pay | Admitting: Internal Medicine

## 2021-03-30 DIAGNOSIS — B9620 Unspecified Escherichia coli [E. coli] as the cause of diseases classified elsewhere: Secondary | ICD-10-CM | POA: Diagnosis present

## 2021-03-30 DIAGNOSIS — I480 Paroxysmal atrial fibrillation: Secondary | ICD-10-CM | POA: Diagnosis present

## 2021-03-30 DIAGNOSIS — E1122 Type 2 diabetes mellitus with diabetic chronic kidney disease: Secondary | ICD-10-CM | POA: Diagnosis present

## 2021-03-30 DIAGNOSIS — Z79899 Other long term (current) drug therapy: Secondary | ICD-10-CM

## 2021-03-30 DIAGNOSIS — K7200 Acute and subacute hepatic failure without coma: Secondary | ICD-10-CM | POA: Diagnosis present

## 2021-03-30 DIAGNOSIS — E861 Hypovolemia: Secondary | ICD-10-CM | POA: Diagnosis present

## 2021-03-30 DIAGNOSIS — Z90710 Acquired absence of both cervix and uterus: Secondary | ICD-10-CM

## 2021-03-30 DIAGNOSIS — R29701 NIHSS score 1: Secondary | ICD-10-CM | POA: Diagnosis present

## 2021-03-30 DIAGNOSIS — D696 Thrombocytopenia, unspecified: Secondary | ICD-10-CM | POA: Diagnosis present

## 2021-03-30 DIAGNOSIS — I10 Essential (primary) hypertension: Secondary | ICD-10-CM | POA: Diagnosis present

## 2021-03-30 DIAGNOSIS — I130 Hypertensive heart and chronic kidney disease with heart failure and stage 1 through stage 4 chronic kidney disease, or unspecified chronic kidney disease: Secondary | ICD-10-CM | POA: Diagnosis present

## 2021-03-30 DIAGNOSIS — I2510 Atherosclerotic heart disease of native coronary artery without angina pectoris: Secondary | ICD-10-CM | POA: Diagnosis present

## 2021-03-30 DIAGNOSIS — E876 Hypokalemia: Secondary | ICD-10-CM | POA: Diagnosis not present

## 2021-03-30 DIAGNOSIS — Z7982 Long term (current) use of aspirin: Secondary | ICD-10-CM

## 2021-03-30 DIAGNOSIS — I4891 Unspecified atrial fibrillation: Secondary | ICD-10-CM | POA: Diagnosis not present

## 2021-03-30 DIAGNOSIS — G459 Transient cerebral ischemic attack, unspecified: Principal | ICD-10-CM | POA: Diagnosis present

## 2021-03-30 DIAGNOSIS — R4701 Aphasia: Secondary | ICD-10-CM | POA: Diagnosis present

## 2021-03-30 DIAGNOSIS — Z952 Presence of prosthetic heart valve: Secondary | ICD-10-CM

## 2021-03-30 DIAGNOSIS — N183 Chronic kidney disease, stage 3 unspecified: Secondary | ICD-10-CM | POA: Diagnosis present

## 2021-03-30 DIAGNOSIS — Z953 Presence of xenogenic heart valve: Secondary | ICD-10-CM

## 2021-03-30 DIAGNOSIS — K760 Fatty (change of) liver, not elsewhere classified: Secondary | ICD-10-CM | POA: Diagnosis present

## 2021-03-30 DIAGNOSIS — E1151 Type 2 diabetes mellitus with diabetic peripheral angiopathy without gangrene: Secondary | ICD-10-CM | POA: Diagnosis present

## 2021-03-30 DIAGNOSIS — Z823 Family history of stroke: Secondary | ICD-10-CM

## 2021-03-30 DIAGNOSIS — Z7901 Long term (current) use of anticoagulants: Secondary | ICD-10-CM

## 2021-03-30 DIAGNOSIS — Z20822 Contact with and (suspected) exposure to covid-19: Secondary | ICD-10-CM | POA: Diagnosis present

## 2021-03-30 DIAGNOSIS — I739 Peripheral vascular disease, unspecified: Secondary | ICD-10-CM | POA: Diagnosis present

## 2021-03-30 DIAGNOSIS — Z8673 Personal history of transient ischemic attack (TIA), and cerebral infarction without residual deficits: Secondary | ICD-10-CM

## 2021-03-30 DIAGNOSIS — E785 Hyperlipidemia, unspecified: Secondary | ICD-10-CM | POA: Diagnosis present

## 2021-03-30 DIAGNOSIS — N179 Acute kidney failure, unspecified: Secondary | ICD-10-CM | POA: Diagnosis not present

## 2021-03-30 DIAGNOSIS — J918 Pleural effusion in other conditions classified elsewhere: Secondary | ICD-10-CM | POA: Diagnosis present

## 2021-03-30 DIAGNOSIS — E871 Hypo-osmolality and hyponatremia: Secondary | ICD-10-CM | POA: Diagnosis present

## 2021-03-30 DIAGNOSIS — R7989 Other specified abnormal findings of blood chemistry: Secondary | ICD-10-CM | POA: Diagnosis present

## 2021-03-30 DIAGNOSIS — R5381 Other malaise: Secondary | ICD-10-CM | POA: Diagnosis present

## 2021-03-30 DIAGNOSIS — G928 Other toxic encephalopathy: Secondary | ICD-10-CM | POA: Diagnosis not present

## 2021-03-30 DIAGNOSIS — I5032 Chronic diastolic (congestive) heart failure: Secondary | ICD-10-CM | POA: Diagnosis present

## 2021-03-30 DIAGNOSIS — Z951 Presence of aortocoronary bypass graft: Secondary | ICD-10-CM

## 2021-03-30 DIAGNOSIS — I4819 Other persistent atrial fibrillation: Secondary | ICD-10-CM | POA: Diagnosis present

## 2021-03-30 DIAGNOSIS — N1831 Chronic kidney disease, stage 3a: Secondary | ICD-10-CM | POA: Diagnosis present

## 2021-03-30 DIAGNOSIS — N390 Urinary tract infection, site not specified: Secondary | ICD-10-CM | POA: Diagnosis present

## 2021-03-30 DIAGNOSIS — Z9049 Acquired absence of other specified parts of digestive tract: Secondary | ICD-10-CM

## 2021-03-30 DIAGNOSIS — C9110 Chronic lymphocytic leukemia of B-cell type not having achieved remission: Secondary | ICD-10-CM | POA: Diagnosis present

## 2021-03-30 DIAGNOSIS — Z85828 Personal history of other malignant neoplasm of skin: Secondary | ICD-10-CM

## 2021-03-30 DIAGNOSIS — Z8249 Family history of ischemic heart disease and other diseases of the circulatory system: Secondary | ICD-10-CM

## 2021-03-30 DIAGNOSIS — J90 Pleural effusion, not elsewhere classified: Secondary | ICD-10-CM

## 2021-03-30 LAB — URINALYSIS, ROUTINE W REFLEX MICROSCOPIC
Bilirubin Urine: NEGATIVE
Glucose, UA: NEGATIVE mg/dL
Ketones, ur: NEGATIVE mg/dL
Nitrite: POSITIVE — AB
Protein, ur: 100 mg/dL — AB
Specific Gravity, Urine: 1.01 (ref 1.005–1.030)
pH: 6.5 (ref 5.0–8.0)

## 2021-03-30 LAB — COMPREHENSIVE METABOLIC PANEL
ALT: 338 U/L — ABNORMAL HIGH (ref 0–44)
AST: 185 U/L — ABNORMAL HIGH (ref 15–41)
Albumin: 3.6 g/dL (ref 3.5–5.0)
Alkaline Phosphatase: 116 U/L (ref 38–126)
Anion gap: 11 (ref 5–15)
BUN: 24 mg/dL — ABNORMAL HIGH (ref 8–23)
CO2: 24 mmol/L (ref 22–32)
Calcium: 9.8 mg/dL (ref 8.9–10.3)
Chloride: 87 mmol/L — ABNORMAL LOW (ref 98–111)
Creatinine, Ser: 0.98 mg/dL (ref 0.44–1.00)
GFR, Estimated: 58 mL/min — ABNORMAL LOW (ref >60)
Glucose, Bld: 118 mg/dL — ABNORMAL HIGH (ref 70–99)
Potassium: 5.1 mmol/L (ref 3.5–5.1)
Sodium: 122 mmol/L — ABNORMAL LOW (ref 135–145)
Total Bilirubin: 1.4 mg/dL — ABNORMAL HIGH (ref 0.3–1.2)
Total Protein: 6.2 g/dL — ABNORMAL LOW (ref 6.5–8.1)

## 2021-03-30 LAB — RAPID URINE DRUG SCREEN, HOSP PERFORMED
Amphetamines: NOT DETECTED
Barbiturates: NOT DETECTED
Benzodiazepines: NOT DETECTED
Cocaine: NOT DETECTED
Opiates: NOT DETECTED
Tetrahydrocannabinol: NOT DETECTED

## 2021-03-30 LAB — CBC
HCT: 40.1 % (ref 36.0–46.0)
Hemoglobin: 13.7 g/dL (ref 12.0–15.0)
MCH: 33.7 pg (ref 26.0–34.0)
MCHC: 34.2 g/dL (ref 30.0–36.0)
MCV: 98.5 fL (ref 80.0–100.0)
Platelets: 135 10*3/uL — ABNORMAL LOW (ref 150–400)
RBC: 4.07 MIL/uL (ref 3.87–5.11)
RDW: 14 % (ref 11.5–15.5)
WBC: 12.8 10*3/uL — ABNORMAL HIGH (ref 4.0–10.5)
nRBC: 0.5 % — ABNORMAL HIGH (ref 0.0–0.2)

## 2021-03-30 LAB — DIFFERENTIAL
Abs Immature Granulocytes: 0.09 10*3/uL — ABNORMAL HIGH (ref 0.00–0.07)
Basophils Absolute: 0 10*3/uL (ref 0.0–0.1)
Basophils Relative: 0 %
Eosinophils Absolute: 0 10*3/uL (ref 0.0–0.5)
Eosinophils Relative: 0 %
Immature Granulocytes: 1 %
Lymphocytes Relative: 28 %
Lymphs Abs: 3.5 10*3/uL (ref 0.7–4.0)
Monocytes Absolute: 1.5 10*3/uL — ABNORMAL HIGH (ref 0.1–1.0)
Monocytes Relative: 12 %
Neutro Abs: 7.6 10*3/uL (ref 1.7–7.7)
Neutrophils Relative %: 59 %

## 2021-03-30 LAB — PROTIME-INR
INR: 1.4 — ABNORMAL HIGH (ref 0.8–1.2)
Prothrombin Time: 17.3 seconds — ABNORMAL HIGH (ref 11.4–15.2)

## 2021-03-30 LAB — TROPONIN I (HIGH SENSITIVITY)
Troponin I (High Sensitivity): 18 ng/L — ABNORMAL HIGH (ref <18)
Troponin I (High Sensitivity): 15 ng/L (ref <18)

## 2021-03-30 LAB — URINALYSIS, MICROSCOPIC (REFLEX)
Squamous Epithelial / HPF: NONE SEEN (ref 0–5)
WBC, UA: >50 WBC/hpf (ref 0–5)

## 2021-03-30 LAB — RESP PANEL BY RT-PCR (FLU A&B, COVID) ARPGX2
Influenza A by PCR: NEGATIVE
Influenza B by PCR: NEGATIVE
SARS Coronavirus 2 by RT PCR: NEGATIVE

## 2021-03-30 LAB — BRAIN NATRIURETIC PEPTIDE: B Natriuretic Peptide: 910.5 pg/mL — ABNORMAL HIGH (ref 0.0–100.0)

## 2021-03-30 LAB — APTT: aPTT: 27 seconds (ref 24–36)

## 2021-03-30 LAB — ETHANOL: Alcohol, Ethyl (B): <10 mg/dL (ref <10)

## 2021-03-30 IMAGING — CT CT ANGIO HEAD-NECK (W OR W/O PERF)
2 of 7 series · 8 of 33 positions shown · non-contrast
Comparison: Same-day noncontrast CT head, CT cervical spine
[DATE], CT neck [DATE] CTA chest [DATE]

CLINICAL DATA: Slurred speech, stroke suspected

EXAM:
CT ANGIOGRAPHY HEAD AND NECK
TECHNIQUE: Multidetector CT imaging of the head and neck was performed using
the standard protocol during bolus administration of intravenous
contrast. Multiplanar CT image reconstructions and MIPs were
obtained to evaluate the vascular anatomy. Carotid stenosis
measurements (when applicable) are obtained utilizing NASCET
criteria, using the distal internal carotid diameter as the
denominator.

[Series 5: cta head neck · axial · 0.46mm/px · z∈[+1423,+1549]mm · 2 of 189 slices shown]
[im 63/189  soft-tissue]
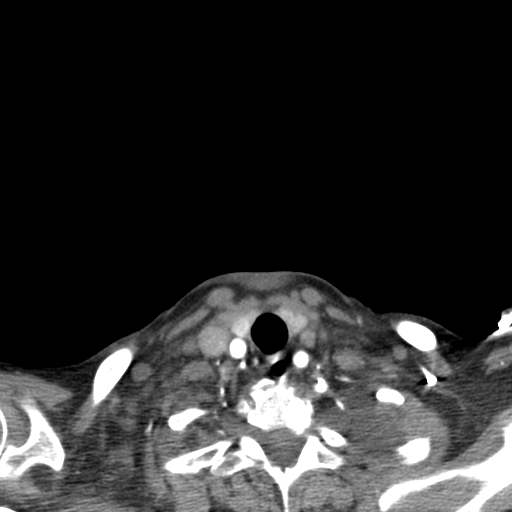
[im 126/189  soft-tissue]
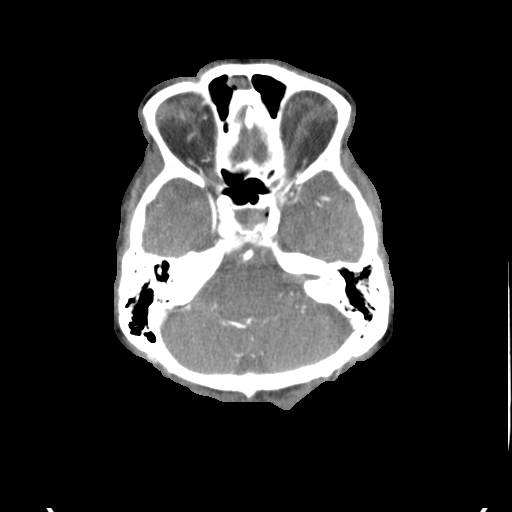

[Series 7: ax thin · axial · 0.39mm/px · z∈[+1319,+1583]mm · 6 of 386 slices shown]
[im 56/386  soft-tissue]
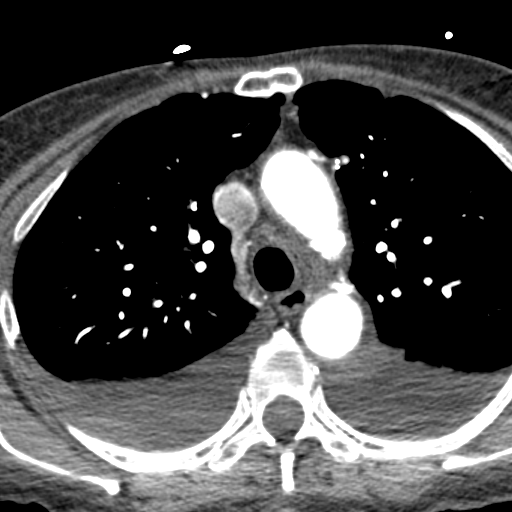
[im 111/386  bone]
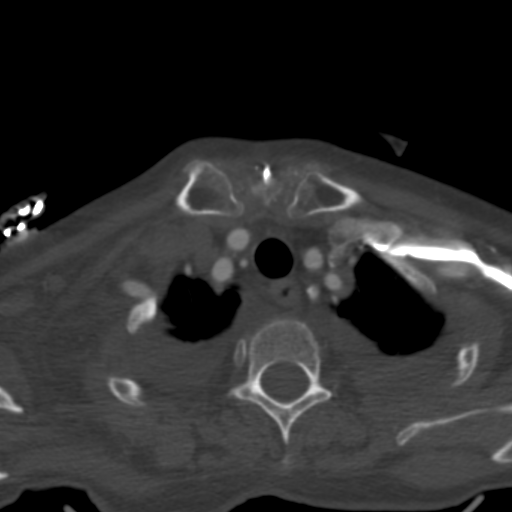
[im 166/386  soft-tissue]
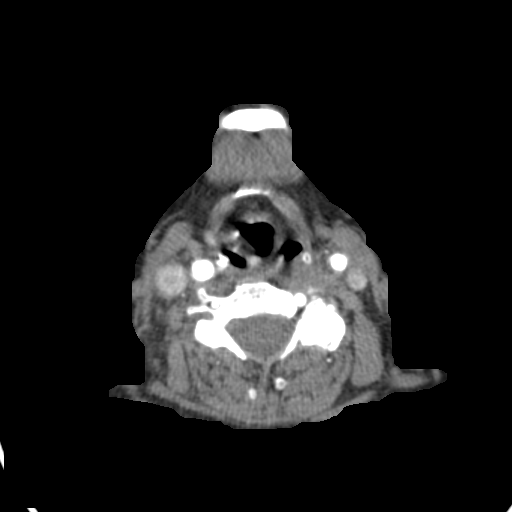
[im 221/386  bone]
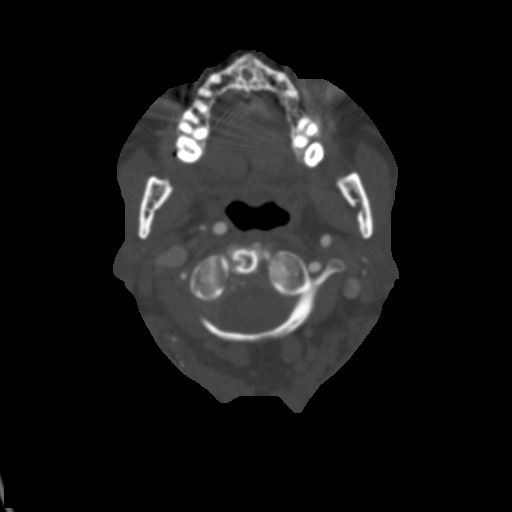
[im 276/386  soft-tissue]
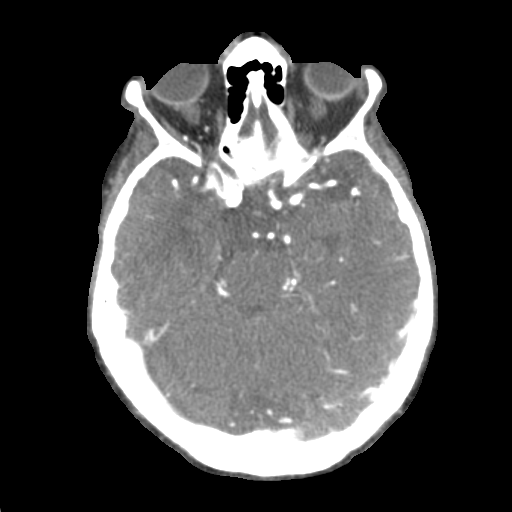
[im 331/386  bone]
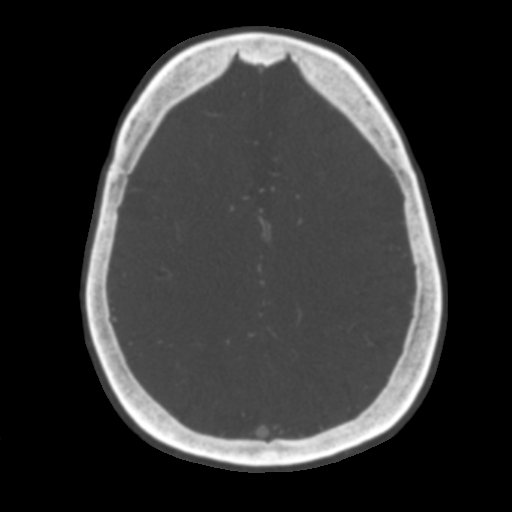

[8 of 33 positions shown; findings below may reference images not displayed]

RADIATION DOSE REDUCTION: This exam was performed according to the
departmental dose-optimization program which includes automated
exposure control, adjustment of the mA and/or kV according to
patient size and/or use of iterative reconstruction technique.

CONTRAST:  75mL OMNIPAQUE IOHEXOL 350 MG/ML SOLN
FINDINGS: CTA NECK FINDINGS

Aortic arch: There is calcified atherosclerotic plaque of the aortic
arch. The origins of the major branch vessels are patent. The
subclavian arteries are patent to the level imaged.

Right carotid system: The right common carotid artery is patent.
There is mixed plaque in the proximal right internal carotid artery
resulting in less than 50% stenosis. The distal right internal
carotid artery is patent, mildly tortuous in the upper neck. There
is severe stenosis at the origin of the external carotid artery with
diminutive proximal branches.

Left carotid system: Surgical clips adjacent to the carotid
bifurcation are consistent with the history of prior endarterectomy.
The left common, internal, and external carotid arteries are patent,
without hemodynamically significant stenosis or occlusion. There is
mild plaque in the distal left common carotid artery and distal
internal carotid artery. The distal internal carotid artery is
mildly tortuous.

Vertebral arteries: The left vertebral artery is dominant, a normal
variant. There is scattered plaque in the left vertebral artery
resulting in mild stenosis at the origin and mild stenosis in the V2
segment. Otherwise, the vertebral arteries are patent, without
hemodynamically significant stenosis or occlusion. There is no
dissection or aneurysm.

Skeleton: There is grade 1 anterolisthesis of C3 on C4. There is
mild multilevel degenerative change of the cervical spine. There is
no acute osseous abnormality or aggressive osseous lesion. There is
degenerative change of the bilateral temporomandibular joints.
Median sternotomy wires are noted. There is no visible canal
hematoma.

Other neck: The soft tissues are unremarkable.

Upper chest: There are large bilateral pleural effusions, new since
the CTA chest from [DATE].

Review of the MIP images confirms the above findings

CTA HEAD FINDINGS

Anterior circulation: There is calcified atherosclerotic plaque of
the bilateral intracranial ICAs resulting in mild-to-moderate
stenosis of the left supraclinoid ICA and no greater than mild
stenosis on the right.

Bilateral MCAs are patent.

The left A1 segment is hypoplastic/absent, a developmental variant.
The ACAs are otherwise patent. The anterior communicating artery is
normal.

There is no aneurysm or AVM.

Posterior circulation: There is a PICA termination of the right V4
segment, a normal variant. There is calcified plaque in the left V4
segment resulting in focal moderate stenosis (7-244). The distal
left V4 segment is patent. The basilar artery is patent.

The bilateral PCAs are patent. There is mild stenosis of the left P2
segment (8-105). The posterior communicating arteries are not
definitely seen.

There is no aneurysm or AVM.

Venous sinuses: Patent.

Anatomic variants: As above.

Review of the MIP images confirms the above findings
IMPRESSION: 1. Mixed plaque in the proximal right internal carotid artery
resulting in less than 50% stenosis. There is minimal plaque in the
left carotid system without hemodynamically significant stenosis or
occlusion.
2. Mild stenosis at the origin of the left vertebral artery and in
the V2 segment. Otherwise, patent vertebral arteries.
3. Intracranial atherosclerotic disease resulting in
mild-to-moderate stenosis of the left supraclinoid ICA, moderate
stenosis of the left V4 segment, and mild stenosis of the left P2
segment. No proximal high-grade stenosis or occlusion.
4. Large bilateral pleural effusions, new since the CTA chest from
[DATE].

Aortic Atherosclerosis ([K1]-[K1]).

## 2021-03-30 IMAGING — CT CT HEAD CODE STROKE
3 series · 14 of 47 positions shown, 16 images · non-contrast
Comparison: CT head [DATE]

CLINICAL DATA: Code stroke. Neuro deficit, stroke suspected.
Slurred speech



[Series 3: head wo · axial · 0.47mm/px · z∈[+1474,+1609]mm · 8 of 33 slices shown, 10 images]
[im 3/33  brain]
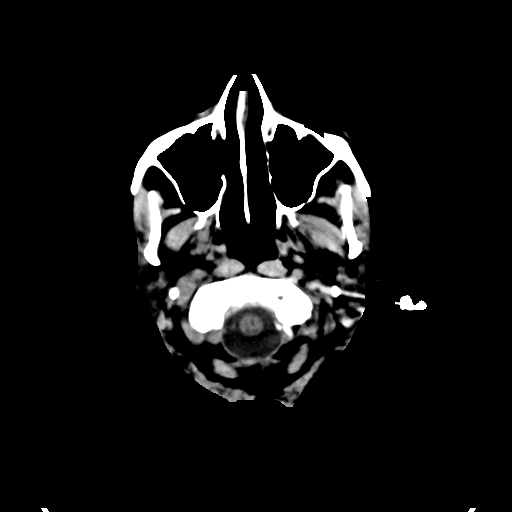
[im 3/33  bone]
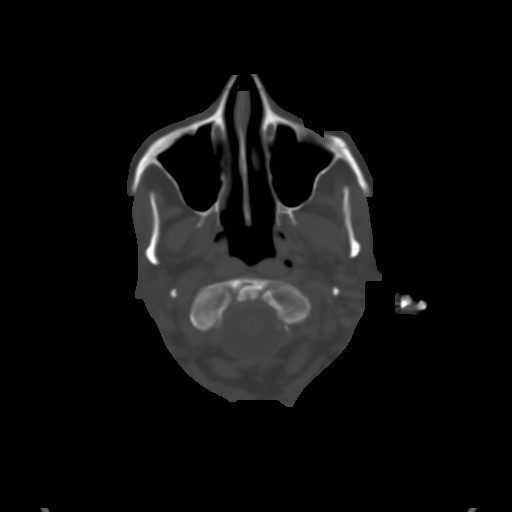
[im 7/33  brain]
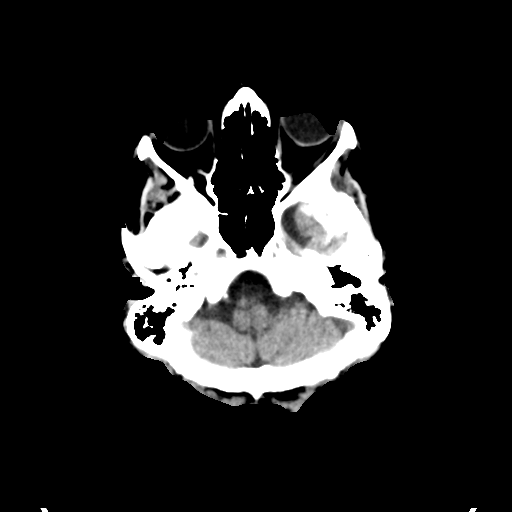
[im 10/33  brain]
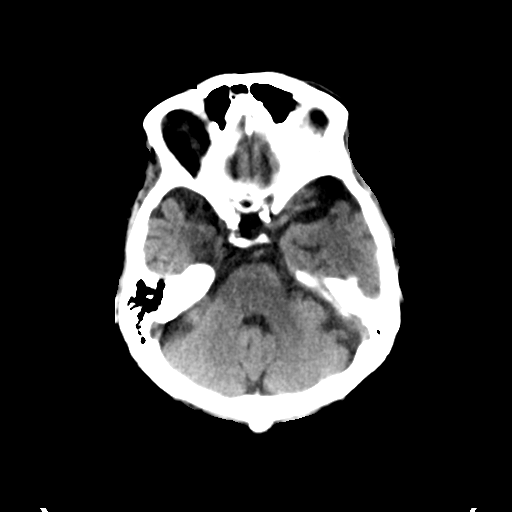
[im 15/33  brain]
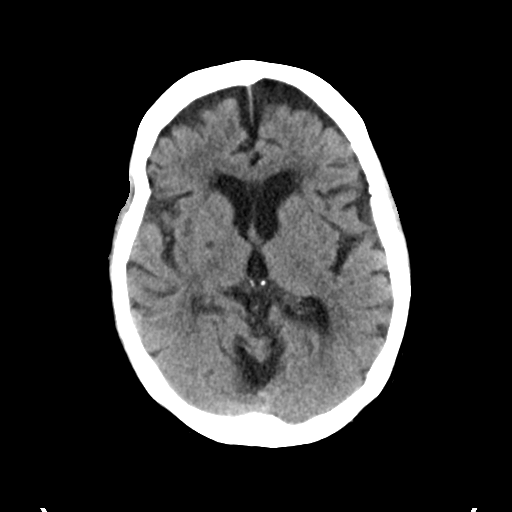
[im 18/33  brain]
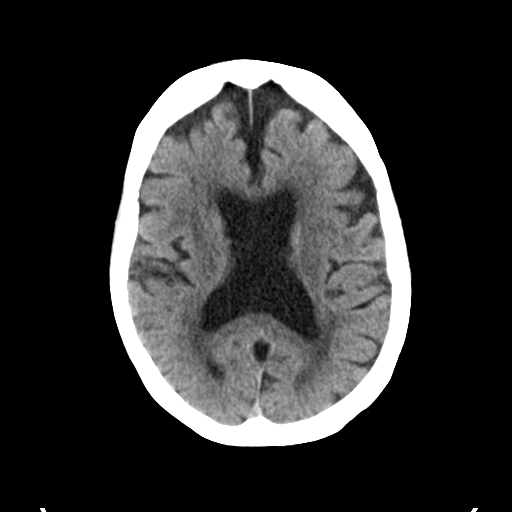
[im 18/33  bone]
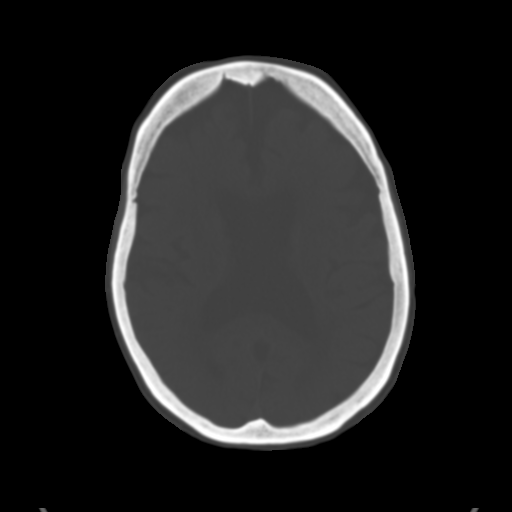
[im 23/33  brain]
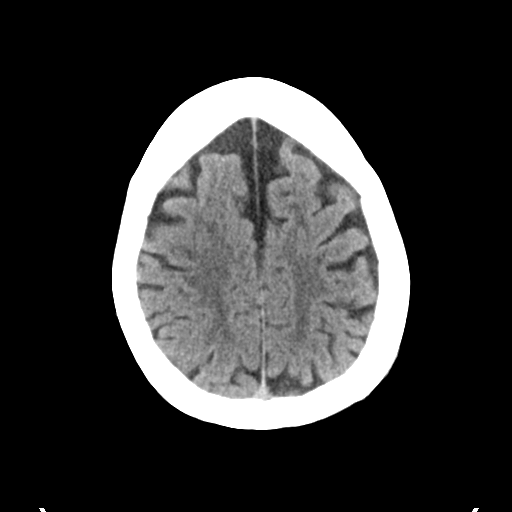
[im 26/33  brain]
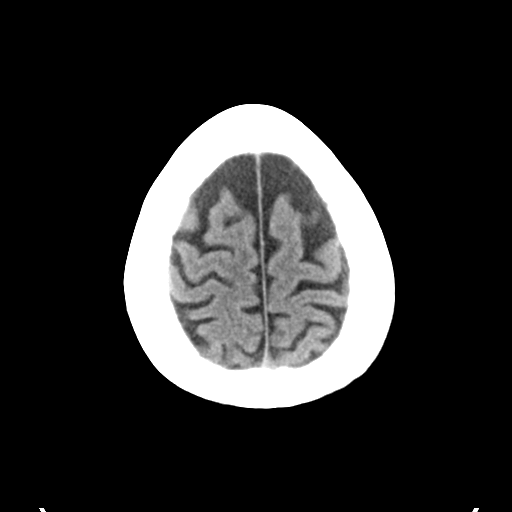
[im 30/33  brain]
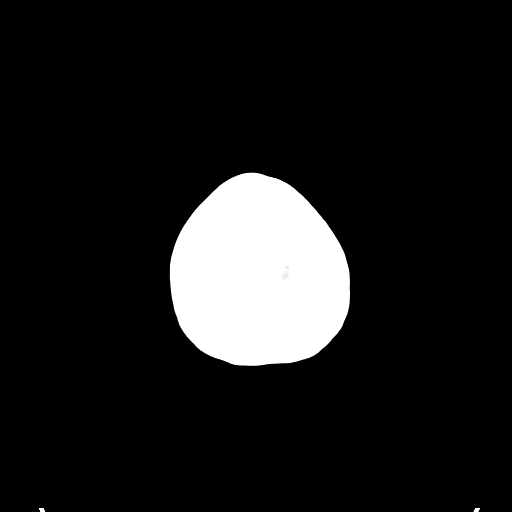

[Series 5: coronal soft tissue · coronal · 0.33mm/px · 3 of 67 slices shown]
[im 23/67  brain]
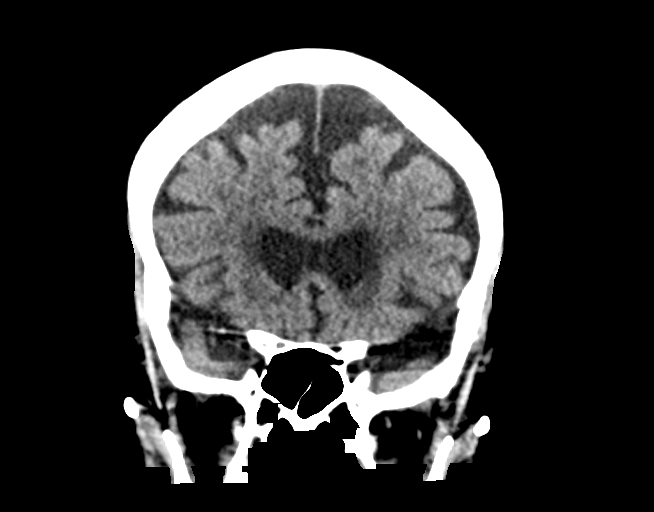
[im 30/67  brain]
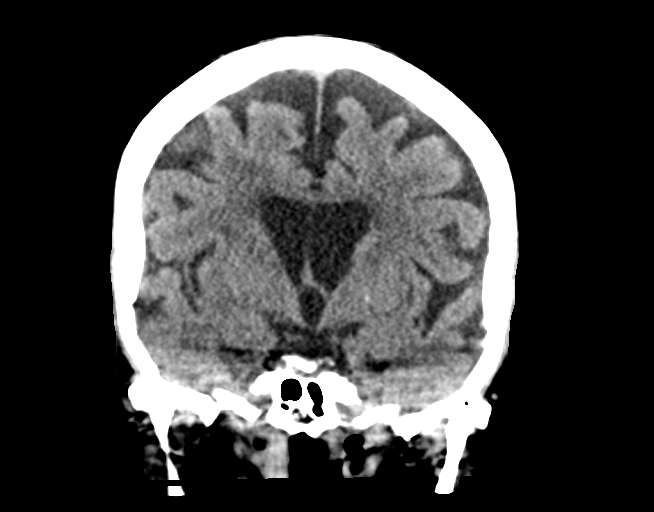
[im 37/67  brain]
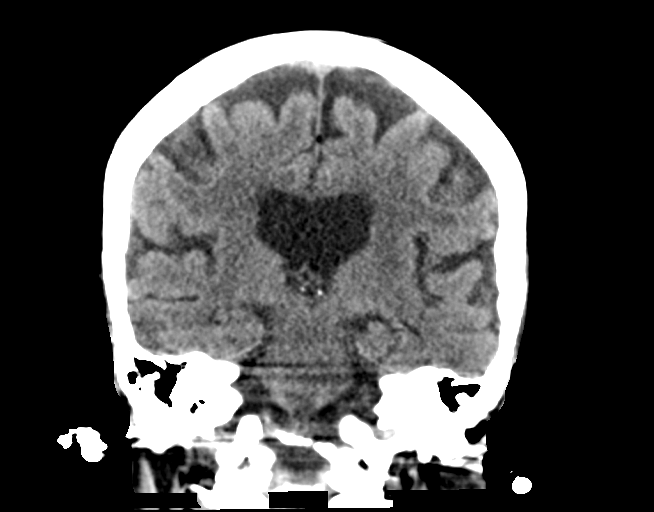

[Series 6: sagittal soft tissue · sagittal · 0.33mm/px · 3 of 70 slices shown]
[im 24/70  brain]
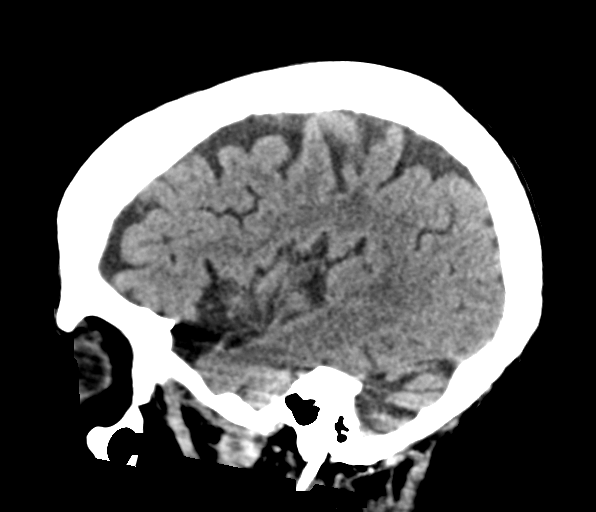
[im 35/70  brain]
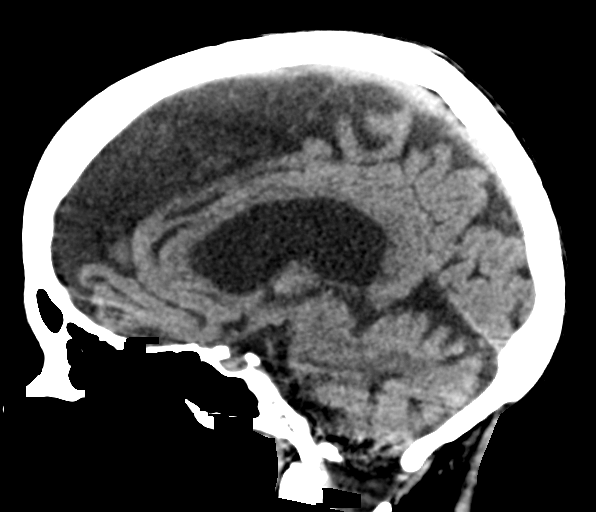
[im 47/70  brain]
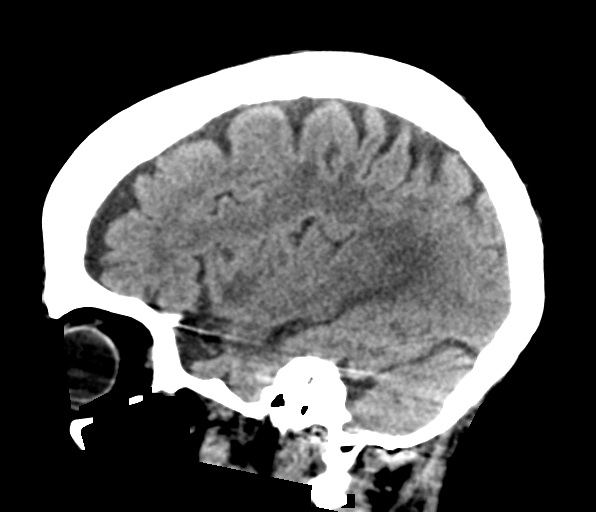

[14 of 47 positions shown; findings below may reference images not displayed]

FINDINGS: Brain: There is no evidence of acute intracranial hemorrhage,
extra-axial fluid collection, or acute infarct.

There is mild global parenchymal volume loss with prominence of the
ventricular system and extra-axial CSF spaces. Confluent hypodensity
in the subcortical and periventricular white matter likely reflects
sequela of advanced chronic white matter microangiopathy. There are
small remote lacunar infarcts in the right basal ganglia and left
insula.

There is no mass lesion.  There is no mass effect or midline shift.

Vascular: There is calcification of the bilateral cavernous ICAs.

Skull: Normal. Negative for fracture or focal lesion.

Sinuses/Orbits: The imaged paranasal sinuses are clear. Bilateral
lens implants are in place. The globes and orbits are otherwise
unremarkable.

Other: None

ASPECTS (Alberta Stroke Program Early CT Score)

- Ganglionic level infarction (caudate, lentiform nuclei, internal
capsule, insula, M1-M3 cortex): 7

- Supraganglionic infarction (M4-M6 cortex): 3

Total score (0-10 with 10 being normal): 10
IMPRESSION: 1. No acute intracranial hemorrhage or infarct.
2. ASPECTS is 10
3. Mild global parenchymal volume loss and moderate chronic white
matter microangiopathy. Small remote lacunar infarcts in the right
basal ganglia and left insula.

These results were called by telephone at the time of interpretation
on [DATE] at [DATE] to provider LAAOUINA , who verbally
acknowledged these results.

## 2021-03-30 IMAGING — DX DG CHEST 1V PORT
1 series · 1 of 1 positions shown · non-contrast
Comparison: [DATE]

CLINICAL DATA: Altered mental status

EXAM:
PORTABLE CHEST 1 VIEW

[chest ap]
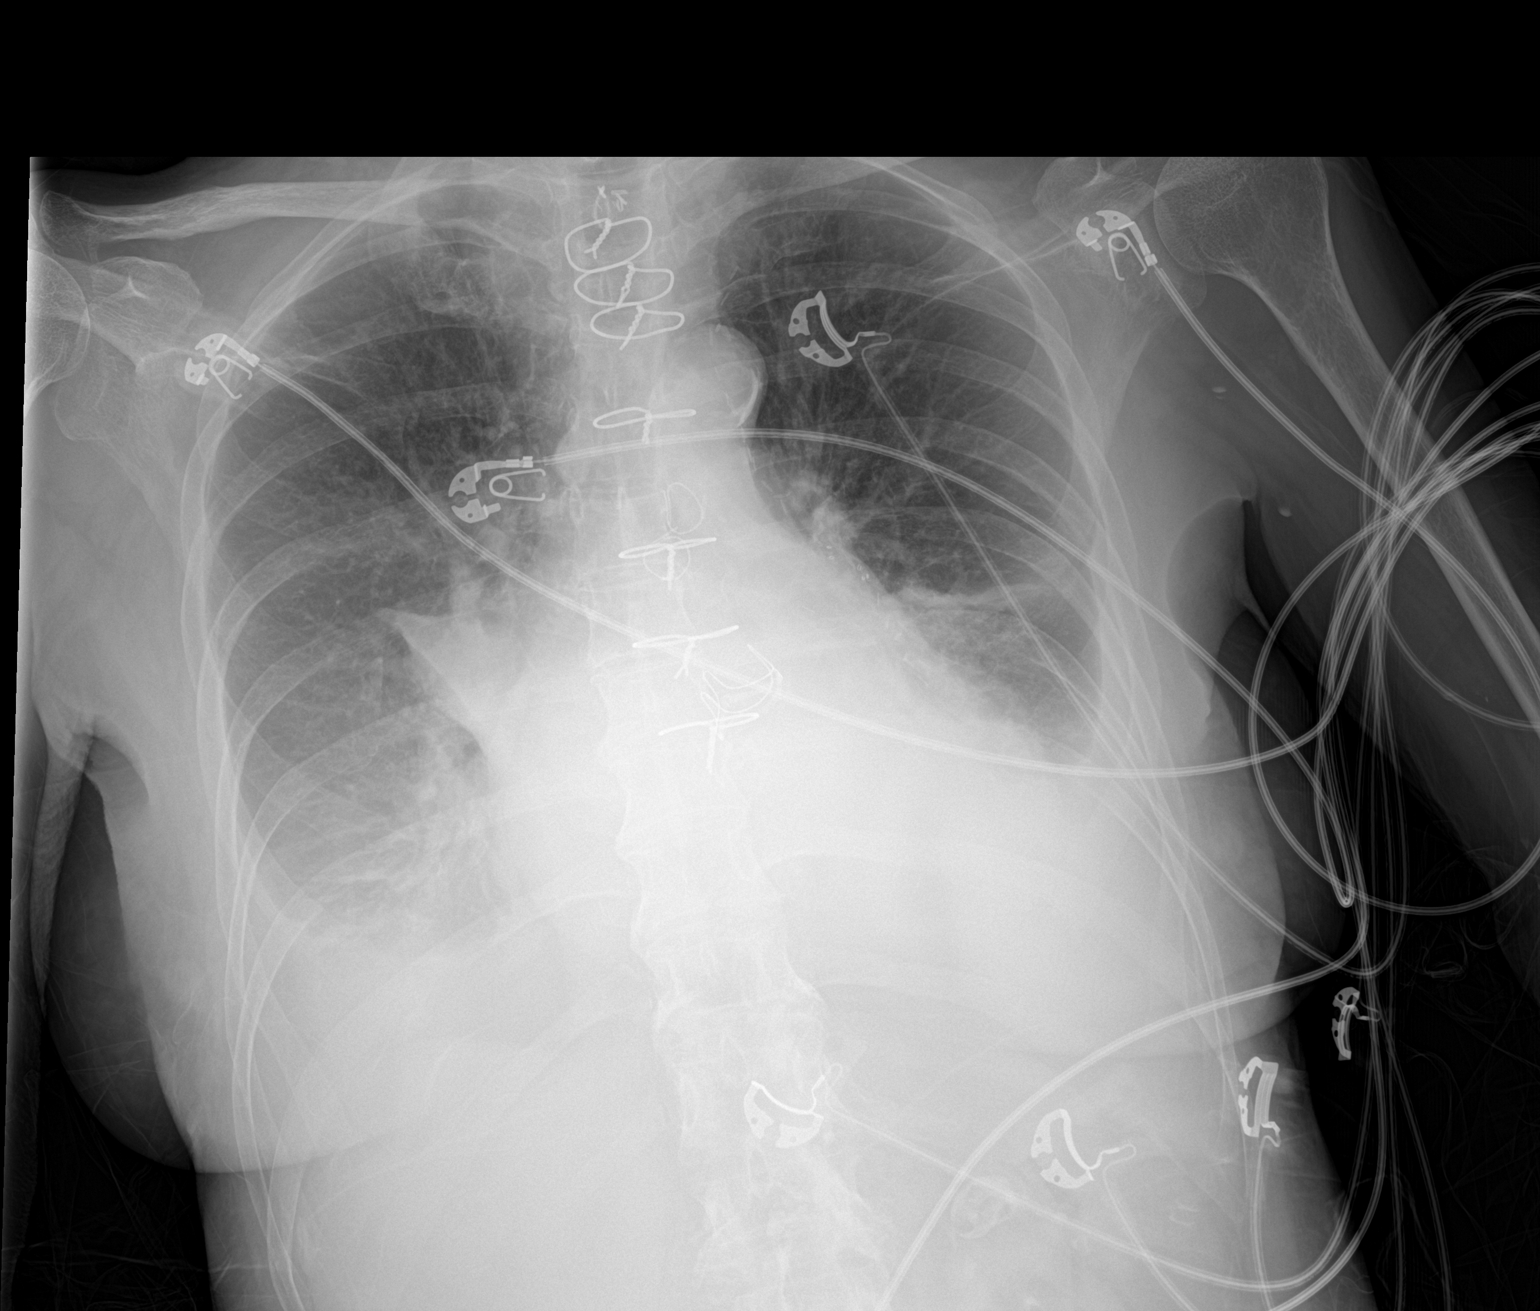

[1 of 1 positions shown; findings below may reference images not displayed]

FINDINGS: Check shadow is enlarged but stable. Postsurgical changes are again
noted. Aortic calcifications are seen. New bilateral pleural
effusions are noted with underlying atelectatic changes. No acute
bony abnormality is noted.
IMPRESSION: New bilateral effusions and atelectatic changes.

## 2021-03-30 MED ORDER — DILTIAZEM HCL-DEXTROSE 125-5 MG/125ML-% IV SOLN (PREMIX)
5.0000 mg/h | INTRAVENOUS | Status: DC
  Administered 2021-03-30: 5 mg/h via INTRAVENOUS
  Administered 2021-03-31: 12.5 mg/h via INTRAVENOUS
  Administered 2021-03-31: 6.25 mg/h via INTRAVENOUS
  Filled 2021-03-30 (×2): qty 125

## 2021-03-30 MED ORDER — STROKE: EARLY STAGES OF RECOVERY BOOK
Freq: Once | Status: AC
  Filled 2021-03-30 (×2): qty 1

## 2021-03-30 MED ORDER — METOPROLOL TARTRATE 50 MG PO TABS
100.0000 mg | ORAL_TABLET | Freq: Two times a day (BID) | ORAL | Status: DC
  Administered 2021-03-31 – 2021-04-05 (×12): 100 mg via ORAL
  Filled 2021-03-30 (×13): qty 2

## 2021-03-30 MED ORDER — ENOXAPARIN SODIUM 30 MG/0.3ML IJ SOSY
30.0000 mg | PREFILLED_SYRINGE | INTRAMUSCULAR | Status: DC
  Administered 2021-03-31: 30 mg via SUBCUTANEOUS
  Filled 2021-03-30 (×2): qty 0.3

## 2021-03-30 MED ORDER — ENOXAPARIN SODIUM 30 MG/0.3ML IJ SOSY: 30.0000 mg | PREFILLED_SYRINGE | INTRAMUSCULAR | Status: DC

## 2021-03-30 MED ORDER — IOHEXOL 350 MG/ML SOLN
75.0000 mL | Freq: Once | INTRAVENOUS | Status: AC | PRN
  Administered 2021-03-30: 75 mL via INTRAVENOUS

## 2021-03-30 MED ORDER — ASPIRIN 81 MG PO CHEW
81.0000 mg | CHEWABLE_TABLET | Freq: Every day | ORAL | Status: DC
  Administered 2021-03-31 – 2021-04-05 (×6): 81 mg via ORAL
  Filled 2021-03-30 (×6): qty 1

## 2021-03-30 MED ORDER — DILTIAZEM LOAD VIA INFUSION
10.0000 mg | Freq: Once | INTRAVENOUS | Status: AC
  Administered 2021-03-30: 10 mg via INTRAVENOUS
  Filled 2021-03-30: qty 10

## 2021-03-30 MED ORDER — FUROSEMIDE 10 MG/ML IJ SOLN
40.0000 mg | Freq: Once | INTRAMUSCULAR | Status: AC
  Administered 2021-03-30: 40 mg via INTRAVENOUS
  Filled 2021-03-30: qty 4

## 2021-03-30 MED ORDER — SODIUM CHLORIDE 0.9 % IV SOLN
1.0000 g | INTRAVENOUS | Status: DC
  Administered 2021-03-30 – 2021-04-01 (×3): 1 g via INTRAVENOUS
  Filled 2021-03-30 (×3): qty 10

## 2021-03-30 NOTE — ED Notes (Signed)
Patient transported to CT 

## 2021-03-30 NOTE — H&P (Addendum)
?History and Physical  ? ? ?Nicole Mercado TKZ:[id number] DOB: Oct 22, 1940 DOA: 03/30/2021 ? ?PCP: Kristie Cowman, MD  ?Patient coming from: Home. ? ?Chief Complaint: Difficulty speaking. ? ?HPI: Nicole Mercado is a 81 y.o. 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  Last known normal was around 4 PM today.  Later after an hour when patient's friend came to check on her she was found to be confused not able to speak and although she spoke was not making sense. ? ?ED Course: In the ER patient initially had some difficulty speaking but gradually became back to baseline.  On-call neurologist Dr. Malen Gauze was consulted.  CT head did not show anything acute and this was followed by CT angiogram of the head and neck which did not show any large vessel obstruction.  Patient also was in A-fib with RVR was started on Cardizem infusion.  In addition patient labs show severe hyponatremia of sodium of 122.  BNP was elevated at 900 chest x-ray shows bilateral pleural effusion which is new.  Patient on exam is not in distress.  COVID test was negative. ? ?Review of Systems: As per HPI, rest all negative. ? ? ?Past Medical History:  ?Diagnosis Date  ? Aortic stenosis   ? a. Echocardiogram (01/11/14):  Mild LVH, EF 60-65%, Gr 1 DD, possible bicuspid AV, severe AS (mean 61 mmHg, peak 104 mmHg), mild MVP of post leaflet, mild MR, PASP 33 mmHg.;  b. 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 Arthritis   ? Atrial fibrillation ([place])   ? post op after CABG+AVR >> Amiodarone  ? CAD (coronary artery disease)   ? a. LHC (01/13/14):  dLM 70 extending into oLAD, mLAD 40-50, pD1 80, pD2 70, ostial/prox OM1 70 >> CABG (L-LAD, S-OM1, S-D1, S-D2)  // Myoview  10/22: Fixed defect anterolateral wall consistent with artifact, EF 71, no ischemia, low risk  ? Carotid artery occlusion   ? a. s/p L CEA 2013;  b.  Carotid US (1/16):  Bilateral ICA 1-39% // Carotid US 10/22: Bilateral ICA 1-39  ? CLL (chronic lymphocytic leukemia) ([place])   ? slow leukemia---Dr  Murinson  ? H/O exercise stress test   ? NSSTT  ? Hx of cardiovascular stress test   ? a. Nuclear stress test That (4/13): Normal perfusion, EF 74%  ? Hx of echocardiogram   ? Echo (2/16):  Mild LVH, EF 60-65%, no RWMA, Gr 2 DD, AVR ok (mean 9 mmHg), trivial AI, mild MR, mild LAE, PASP 40 mmHg  ? Hypertension   ? PAD (peripheral artery disease) ([place])   ? LE Arterial US 10/22: R - pCFA, oSFA, dSFA 30-49; L - pCFA + mSFA 50-74, pPop 30-49 >> referred to [facility]  ? Pancreatitis   ? h/o  ? Thrombocytopenia ([place]) 11/19/2010  ? ? ?Past Surgical History:  ?Procedure Laterality Date  ? ABDOMINAL HYSTERECTOMY    ? AORTIC VALVE REPLACEMENT [name] 01/17/2014  ? Procedure: AORTIC VALVE REPLACEMENT (AVR);  Surgeon: Gaye Pollack, MD;  Location: [facility];  Service: Open Heart Surgery;  Laterality: [name];  ? APPENDECTOMY    ? CAROTID ENDARTERECTOMY  06/09/11  ? LEFT  cea  ?  CESAREAN SECTION    ? FIVE  ? CHOLECYSTECTOMY    ? CORONARY ARTERY BYPASS GRAFT [name] 01/17/2014  ? Procedure: CORONARY ARTERY BYPASS GRAFTING (CABG)TIMES FOUR USING LEFT INTERNAL MAMMARY ARTERY AND RIGHT SAPHENOUS VEIN HARVESTED ENDOSCOPICALLY;  Surgeon: Gaye Pollack, MD;  Location: [facility] OR;  Service: Open Heart Surgery;  Laterality: [name];  ? ENDARTERECTOMY  06/09/2011  ? Procedure: ENDARTERECTOMY CAROTID;  Surgeon: Rosetta Posner, MD;  Location: [facility] OR;  Service: Vascular;  Laterality: Left;  left carotid endarterectomy with patch angioplasty  ? LEFT AND RIGHT HEART CATHETERIZATION WITH CORONARY ANGIOGRAM [name] 01/13/2014  ? Procedure: LEFT AND RIGHT HEART CATHETERIZATION WITH CORONARY ANGIOGRAM;  Surgeon: Jettie Booze, MD;  Location: [facility] C[facility] LAB;  Service: Cardiovascular;  Laterality:  [name];  ? TEE WITHOUT CARDIOVERSION [name] 01/17/2014  ? Procedure: TRANSESOPHAGEAL ECHOCARDIOGRAM (TEE);  Surgeon: Gaye Pollack, MD;  Location: [place];  Service: Open Heart Surgery;  Laterality: [name];  ? TONSILLECTOMY    ? ? ? reports that she has never smoked. She has never used smokeless tobacco. She reports that she does not drink alcohol and does not use drugs. ? ?No Known Allergies ? ?Family History  ?Problem Relation Age of Onset  ? Heart disease Mother   ?     in her 34s  ? Hypertension Mother   ? Heart attack Mother   ? Heart disease Father   ? Hypertension Father   ? Hypertension Sister   ? Hypertension Son   ? Stroke Paternal Grandmother   ? Stroke Paternal Grandfather   ? ? ?Prior to Admission medications   ?Medication Sig Start Date End Date Taking? Authorizing Provider  ?aspirin 81 MG tablet Take 81 mg by mouth daily.   Yes [provider]  ?acetaminophen (TYLENOL) 325 MG tablet Take 650 mg by mouth every 6 (six) hours as needed for mild pain or headache.     [provider]  ?apixaban (ELIQUIS) 2.5 MG TABS tablet Take 1 tablet (2.5 mg total) by mouth 2 (two) times daily. 03/21/21   Allie Bossier, MD  ?atorvastatin (LIPITOR) 10 MG tablet TAKE 1 TABLET BY MOUTH ONCE DAILY AT  6  IN  THE  EVENING ?Patient taking differently: Take 10 mg by mouth daily. 12/28/20   Sherren Mocha, MD  ?Calcium Carb-Cholecalciferol 500-125 MG-UNIT TABS Take 1 tablet by mouth daily.    [provider]  ?metoprolol tartrate (LOPRESSOR) 100 MG tablet Take 1 tablet (100 mg total) by mouth 2 (two) times daily. 03/25/21   Jettie Booze, MD  ?Multiple Vitamins-Minerals (HM MULTIVITAMIN ADULT GUMMY PO) Take 1 tablet by mouth daily.    [provider]  ?oxybutynin (DITROPAN-XL) 10 MG 24 hr tablet Take 10 mg by mouth daily. 03/08/21   [provider]  ?oxybutynin (DITROPAN-XL) 5 MG 24 hr tablet Take 5 mg by mouth at bedtime.    [provider]  ? ? ?Physical Exam: ?Constitutional:  Moderately built and nourished. ?Vitals:  ? 03/30/21 2050 03/30/21 2100 03/30/21 2130 03/30/21 2145  ?BP: (!) 174/100     ?Pulse: 99  60 96  ?Resp: '13 17 13 '$ (!) 21  ?Temp:      ?TempSrc:      ?SpO2: 92% 91% 95% 94%  ? ?Eyes: Anicteric no pallor. ?ENMT: No discharge from the ears eyes nose and mouth. ?Neck: No mass felt.  No neck rigidity. ?Respiratory: No rhonchi or crepitations. ?Cardiovascular: S1-S2 heard. ?Abdomen: Soft nontender bowel sound present. ?Musculoskeletal:  No edema. ?Skin: No rash. ?Neurologic: Alert awake oriented to name and place moving all extremities 5 x 5.  No facial asymmetry tongue is benign pupils are equal and reacting to light. ?Psychiatric: Oriented to name and place. ? ? ?Labs on Admission: I have personally reviewed following labs and imaging studies ? ?CBC: ?Recent Labs  ?Lab 03/30/21 ?1841  ?WBC 12.8*  ?NEUTROABS 7.6  ?HGB 13.7  ?HCT 40.1  ?MCV 98.5  ?PLT 135*  ? ?Basic Metabolic Panel: ?Recent Labs  ?Lab 03/30/21 ?1841  ?NA 122*  ?K 5.1  ?CL 87*  ?CO2 24  ?GLUCOSE 118*  ?BUN 24*  ?CREATININE 0.98  ?CALCIUM 9.8  ? ?GFR: ?Estimated Creatinine Clearance: 32.2 mL/min (by C-G formula based on SCr of 0.98 mg/dL). ?Liver Function Tests: ?Recent Labs  ?Lab 03/30/21 ?1841  ?AST 185*  ?ALT 338*  ?ALKPHOS 116  ?BILITOT 1.4*  ?PROT 6.2*  ?ALBUMIN 3.6  ? ?No results for input(s): LIPASE, AMYLASE in the last 168 hours. ?No results for input(s): AMMONIA in the last 168 hours. ?Coagulation Profile: ?Recent Labs  ?Lab 03/30/21 ?1841  ?INR 1.4*  ? ?Cardiac Enzymes: ?No results for input(s): CKTOTAL, CKMB, CKMBINDEX, TROPONINI in the last 168 hours. ?BNP (last 3 results) ?No results for input(s): PROBNP in the last 8760 hours. ?HbA1C: ?No results for input(s): HGBA1C in the last 72 hours. ?CBG: ?No results for input(s): GLUCAP in the last 168 hours. ?Lipid Profile: ?No results for input(s): CHOL, HDL, LDLCALC, TRIG, CHOLHDL, LDLDIRECT in the last 72 hours. ?Thyroid Function Tests: ?No results for  input(s): TSH, T4TOTAL, FREET4, T3FREE, THYROIDAB in the last 72 hours. ?Anemia Panel: ?No results for input(s): VITAMINB12, FOLATE, FERRITIN, TIBC, IRON, RETICCTPCT in the last 72 hours. ?Urine analysis:

## 2021-03-30 NOTE — ED Provider Notes (Addendum)
[facility]-EMERGENCY DEPT Provider Note   CSN: [id number] Arrival date & time: 03/30/21  1752     History  Chief Complaint  Patient presents with   Code Stroke    Nicole Mercado is a 81 y.o. female.  Patient with hx of atrial fibrillation on lopressor and eliquis presents with a friend who poissible stroke. Friend states last talked to her at 4 pm and she had clear speech and making sense and then at around 5-530, talked to her again and she had slurred speech and not making sense of her words, seems to be improving. No facial weakness or arm or leg weakness, no facial droop. Took her medications this morning including eliquis.   The history is provided by the patient and a caregiver.  Neurologic Problem This is a new problem. The current episode started 1 to 2 hours ago. The problem occurs constantly. The problem has been gradually improving. Pertinent negatives include no chest pain, no abdominal pain, no headaches and no shortness of breath. Nothing aggravates the symptoms. Nothing relieves the symptoms. She has tried nothing for the symptoms. The treatment provided mild relief.      Home Medications Prior to Admission medications   Medication Sig Start Date End Date Taking? Authorizing Provider  acetaminophen (TYLENOL) 325 MG tablet Take 650 mg by mouth every 6 (six) hours as needed for mild pain or headache.     [provider]  apixaban (ELIQUIS) 2.5 MG TABS tablet Take 1 tablet (2.5 mg total) by mouth 2 (two) times daily. 03/21/21   Drema Dallas, MD  aspirin 81 MG tablet Take 81 mg by mouth daily.    [provider]  atorvastatin (LIPITOR) 10 MG tablet TAKE 1 TABLET BY MOUTH ONCE DAILY AT  6  IN  THE  EVENING Patient taking differently: Take 10 mg by mouth daily. 12/28/20   Tonny Bollman, MD  Calcium Carb-Cholecalciferol 500-125 MG-UNIT TABS Take 1 tablet by mouth daily.    [provider]  metoprolol tartrate (LOPRESSOR) 100  MG tablet Take 1 tablet (100 mg total) by mouth 2 (two) times daily. 03/25/21   Corky Crafts, MD  Multiple Vitamins-Minerals (HM MULTIVITAMIN ADULT GUMMY PO) Take 1 tablet by mouth daily.    [provider]  oxybutynin (DITROPAN-XL) 10 MG 24 hr tablet Take 10 mg by mouth daily. 03/08/21   [provider]  oxybutynin (DITROPAN-XL) 5 MG 24 hr tablet Take 5 mg by mouth at bedtime.    [provider]      Allergies    Patient has no known allergies.    Review of Systems   Review of Systems  Respiratory:  Negative for shortness of breath.   Cardiovascular:  Negative for chest pain.  Gastrointestinal:  Negative for abdominal pain.  Neurological:  Negative for headaches.   Physical Exam Updated Vital Signs BP (!) 165/90   Pulse 79   Temp 97.6 F (36.4 C) (Oral)   Resp 13   SpO2 93%  Physical Exam Vitals and nursing note reviewed.  Constitutional:      General: She is not in acute distress.    Appearance: She is well-developed. She is not ill-appearing.  HENT:     Head: Normocephalic and atraumatic.     Nose: Nose normal.     Mouth/Throat:     Mouth: Mucous membranes are moist.  Eyes:     Extraocular Movements: Extraocular movements intact.     Conjunctiva/sclera: Conjunctivae  normal.     Pupils: Pupils are equal, round, and reactive to light.  Cardiovascular:     Rate and Rhythm: Tachycardia present. Rhythm irregular.     Pulses: Normal pulses.     Heart sounds: No murmur heard. Pulmonary:     Effort: Pulmonary effort is normal. No respiratory distress.     Breath sounds: Normal breath sounds.  Abdominal:     Palpations: Abdomen is soft.     Tenderness: There is no abdominal tenderness.  Musculoskeletal:        General: No swelling.     Cervical back: Normal range of motion and neck supple.  Skin:    General: Skin is warm and dry.     Capillary Refill: Capillary refill takes less than 2 seconds.  Neurological:     Mental Status: She is  alert.     Sensory: No sensory deficit.     Motor: No weakness.     Coordination: Coordination abnormal (slighty off with finger to nose finger on the right compared to left, slow to respond).     Comments: Slurred speech, expressive aphasia at times, strength and sensation and visual fields appear intact.   Psychiatric:        Mood and Affect: Mood normal.    ED Results / Procedures / Treatments   Labs (all labs ordered are listed, but only abnormal results are displayed) Labs Reviewed  PROTIME-INR - Abnormal; Notable for the following components:      Result Value   Prothrombin Time 17.3 (*)    INR 1.4 (*)    All other components within normal limits  CBC - Abnormal; Notable for the following components:   WBC 12.8 (*)    Platelets 135 (*)    nRBC 0.5 (*)    All other components within normal limits  DIFFERENTIAL - Abnormal; Notable for the following components:   Monocytes Absolute 1.5 (*)    Abs Immature Granulocytes 0.09 (*)    All other components within normal limits  COMPREHENSIVE METABOLIC PANEL - Abnormal; Notable for the following components:   Sodium 122 (*)    Chloride 87 (*)    Glucose, Bld 118 (*)    BUN 24 (*)    Total Protein 6.2 (*)    AST 185 (*)    ALT 338 (*)    Total Bilirubin 1.4 (*)    GFR, Estimated 58 (*)    All other components within normal limits  TROPONIN I (HIGH SENSITIVITY) - Abnormal; Notable for the following components:   Troponin I (High Sensitivity) 18 (*)    All other components within normal limits  RESP PANEL BY RT-PCR (FLU A&B, COVID) ARPGX2  APTT  RAPID URINE DRUG SCREEN, HOSP PERFORMED  URINALYSIS, ROUTINE W REFLEX MICROSCOPIC  ETHANOL  BRAIN NATRIURETIC PEPTIDE  I-STAT CHEM 8, ED  TROPONIN I (HIGH SENSITIVITY)    EKG EKG Interpretation  Date/Time:  Saturday March 30 2021 18:02:46 EDT Ventricular Rate:  142 PR Interval:    QRS Duration: 82 QT Interval:  293 QTC Calculation: 451 R Axis:   34 Text  Interpretation: Atrial fibrillation with rapid V-rate Anterior infarct, old Nonspecific T abnormalities, lateral leads Confirmed by Virgina Norfolk [phone]) on 03/30/2021 6:47:23 PM  Radiology DG Chest Portable 1 View  Result Date: 03/30/2021 CLINICAL DATA:  Altered mental status EXAM: PORTABLE CHEST 1 VIEW COMPARISON:  03/19/2021 FINDINGS: Check shadow is enlarged but stable. Postsurgical changes are again noted. Aortic calcifications are seen. New bilateral  pleural effusions are noted with underlying atelectatic changes. No acute bony abnormality is noted. IMPRESSION: New bilateral effusions and atelectatic changes. Electronically Signed   By: Alcide Clever M.D.   On: 03/30/2021 19:28   CT HEAD CODE STROKE WO CONTRAST  Result Date: 03/30/2021 CLINICAL DATA:  Code stroke. Neuro deficit, stroke suspected. Slurred speech EXAM: CT HEAD WITHOUT CONTRAST TECHNIQUE: Contiguous axial images were obtained from the base of the skull through the vertex without intravenous contrast. RADIATION DOSE REDUCTION: This exam was performed according to the departmental dose-optimization program which includes automated exposure control, adjustment of the mA and/or kV according to patient size and/or use of iterative reconstruction technique. COMPARISON:  CT head 08/18/2020 FINDINGS: Brain: There is no evidence of acute intracranial hemorrhage, extra-axial fluid collection, or acute infarct. There is mild global parenchymal volume loss with prominence of the ventricular system and extra-axial CSF spaces. Confluent hypodensity in the subcortical and periventricular white matter likely reflects sequela of advanced chronic white matter microangiopathy. There are small remote lacunar infarcts in the right basal ganglia and left insula. There is no mass lesion.  There is no mass effect or midline shift. Vascular: There is calcification of the bilateral cavernous ICAs. Skull: Normal. Negative for fracture or focal lesion. Sinuses/Orbits:  The imaged paranasal sinuses are clear. Bilateral lens implants are in place. The globes and orbits are otherwise unremarkable. Other: None ASPECTS (Alberta Stroke Program Early CT Score) - Ganglionic level infarction (caudate, lentiform nuclei, internal capsule, insula, M1-M3 cortex): 7 - Supraganglionic infarction (M4-M6 cortex): 3 Total score (0-10 with 10 being normal): 10 IMPRESSION: 1. No acute intracranial hemorrhage or infarct. 2. ASPECTS is 10 3. Mild global parenchymal volume loss and moderate chronic white matter microangiopathy. Small remote lacunar infarcts in the right basal ganglia and left insula. These results were called by telephone at the time of interpretation on 03/30/2021 at 6:48 pm to provider Odilia Damico , who verbally acknowledged these results. Electronically Signed   By: Lesia Hausen M.D.   On: 03/30/2021 18:47   CT ANGIO HEAD NECK W WO CM (CODE STROKE)  Result Date: 03/30/2021 CLINICAL DATA:  Slurred speech, stroke suspected EXAM: CT ANGIOGRAPHY HEAD AND NECK TECHNIQUE: Multidetector CT imaging of the head and neck was performed using the standard protocol during bolus administration of intravenous contrast. Multiplanar CT image reconstructions and MIPs were obtained to evaluate the vascular anatomy. Carotid stenosis measurements (when applicable) are obtained utilizing NASCET criteria, using the distal internal carotid diameter as the denominator. RADIATION DOSE REDUCTION: This exam was performed according to the departmental dose-optimization program which includes automated exposure control, adjustment of the mA and/or kV according to patient size and/or use of iterative reconstruction technique. CONTRAST:  75mL OMNIPAQUE IOHEXOL 350 MG/ML SOLN COMPARISON:  Same-day noncontrast CT head, CT cervical spine 08/18/2020, CT neck 11/05/2010 CTA chest 03/19/2021 FINDINGS: CTA NECK FINDINGS Aortic arch: There is calcified atherosclerotic plaque of the aortic arch. The origins of the  major branch vessels are patent. The subclavian arteries are patent to the level imaged. Right carotid system: The right common carotid artery is patent. There is mixed plaque in the proximal right internal carotid artery resulting in less than 50% stenosis. The distal right internal carotid artery is patent, mildly tortuous in the upper neck. There is severe stenosis at the origin of the external carotid artery with diminutive proximal branches. Left carotid system: Surgical clips adjacent to the carotid bifurcation are consistent with the history of prior endarterectomy. The left common,  internal, and external carotid arteries are patent, without hemodynamically significant stenosis or occlusion. There is mild plaque in the distal left common carotid artery and distal internal carotid artery. The distal internal carotid artery is mildly tortuous. Vertebral arteries: The left vertebral artery is dominant, a normal variant. There is scattered plaque in the left vertebral artery resulting in mild stenosis at the origin and mild stenosis in the V2 segment. Otherwise, the vertebral arteries are patent, without hemodynamically significant stenosis or occlusion. There is no dissection or aneurysm. Skeleton: There is grade 1 anterolisthesis of C3 on C4. There is mild multilevel degenerative change of the cervical spine. There is no acute osseous abnormality or aggressive osseous lesion. There is degenerative change of the bilateral temporomandibular joints. Median sternotomy wires are noted. There is no visible canal hematoma. Other neck: The soft tissues are unremarkable. Upper chest: There are large bilateral pleural effusions, new since the CTA chest from 03/19/2021. Review of the MIP images confirms the above findings CTA HEAD FINDINGS Anterior circulation: There is calcified atherosclerotic plaque of the bilateral intracranial ICAs resulting in mild-to-moderate stenosis of the left supraclinoid ICA and no greater  than mild stenosis on the right. Bilateral MCAs are patent. The left A1 segment is hypoplastic/absent, a developmental variant. The ACAs are otherwise patent. The anterior communicating artery is normal. There is no aneurysm or AVM. Posterior circulation: There is a PICA termination of the right V4 segment, a normal variant. There is calcified plaque in the left V4 segment resulting in focal moderate stenosis (7-244). The distal left V4 segment is patent. The basilar artery is patent. The bilateral PCAs are patent. There is mild stenosis of the left P2 segment (8-105). The posterior communicating arteries are not definitely seen. There is no aneurysm or AVM. Venous sinuses: Patent. Anatomic variants: As above. Review of the MIP images confirms the above findings IMPRESSION: 1. Mixed plaque in the proximal right internal carotid artery resulting in less than 50% stenosis. There is minimal plaque in the left carotid system without hemodynamically significant stenosis or occlusion. 2. Mild stenosis at the origin of the left vertebral artery and in the V2 segment. Otherwise, patent vertebral arteries. 3. Intracranial atherosclerotic disease resulting in mild-to-moderate stenosis of the left supraclinoid ICA, moderate stenosis of the left V4 segment, and mild stenosis of the left P2 segment. No proximal high-grade stenosis or occlusion. 4. Large bilateral pleural effusions, new since the CTA chest from 03/19/2021. Aortic Atherosclerosis (ICD10-I70.0). Electronically Signed   By: Lesia Hausen M.D.   On: 03/30/2021 19:40    Procedures .Critical Care Performed by: Virgina Norfolk, DO Authorized by: Virgina Norfolk, DO   Critical care provider statement:    Critical care time (minutes):  50   Critical care was necessary to treat or prevent imminent or life-threatening deterioration of the following conditions:  Cardiac failure and CNS failure or compromise   Critical care was time spent personally by me on the  following activities:  Blood draw for specimens, development of treatment plan with patient or surrogate, discussions with consultants, discussions with primary provider, evaluation of patient's response to treatment, obtaining history from patient or surrogate, ordering and performing treatments and interventions, ordering and review of laboratory studies, ordering and review of radiographic studies, pulse oximetry, re-evaluation of patient's condition and review of old charts   I assumed direction of critical care for this patient from another provider in my specialty: no     Care discussed with: admitting provider  Medications Ordered in ED Medications  diltiazem (CARDIZEM) 1 mg/mL load via infusion 10 mg (10 mg Intravenous Bolus from Bag 03/30/21 1848)    And  diltiazem (CARDIZEM) 125 mg in dextrose 5% 125 mL (1 mg/mL) infusion (5 mg/hr Intravenous New Bag/Given 03/30/21 1858)  furosemide (LASIX) injection 40 mg (has no administration in time range)  iohexol (OMNIPAQUE) 350 MG/ML injection 75 mL (75 mLs Intravenous Contrast Given 03/30/21 1919)    ED Course/ Medical Decision Making/ A&P                           Medical Decision Making Amount and/or Complexity of Data Reviewed Labs: ordered. Radiology: ordered.  Risk Prescription drug management. Decision regarding hospitalization.   Nicole Mercado is here with concern for stroke symptoms.  Patient hypertensive and in atrial fibrillation with RVR.  EKG shows per my review and interpretation atrial fibrillation with RVR.  Patient has a history of atrial fibrillation that was newly diagnosed about a week or 2 ago.  She is on Eliquis and Cardizem.  Patient per friend developed some slurred speech and not making sense of her words around an hour or so ago.  He states he last talked to her around 4 PM and she seemed normal.  He has been helping her with her medications as since she has been home from the hospital she has been weak and not  able to always take care of herself.  He states that her last dose of Eliquis was this morning as he gave it to her.  Patient did not have any facial weakness or other obvious stroke symptoms after talking with friend in the room.  She has slightly slurred speech and possibly some mild expressive aphasia.  She is a little bit slow to participate in exam but there is no obvious weakness or numbness or facial droop or visual field deficit.  She is a little bit uncoordinated with finger-nose-finger in the right upper extremity compared to left upper extremity.  Seems like her symptoms may be stuttering where at times she answers questions clearly and appropriately and then times where she is not.  Code stroke was activated.  Head CT per my review and interpretation shows no head bleed.  I talked on the phone with radiology about this.  I have started IV diltiazem bolus and infusion for A-fib with RVR.  Dr. Jerrell Belfast with neurology is evaluating the patient and is recommending CTA of the head and neck to further rule out LVO.  Lab work has been collected to evaluate for other causes of confusion and speech changes including electrolyte abnormality, infectious process.  We will get CBC, CMP, urinalysis, chest x-ray, COVID testing.  Given that she is in A-fib with RVR and concern for stroke symptoms anticipate admission.  Possible that this could be TIA as symptoms appear to have improved.  Dr. Jerrell Belfast with neurology states possibly TIA and recommends admission for further TIA work-up.  Her CTA of her head and neck showed no large vessel occlusion.  Troponin is 18.  Liver enzymes mildly elevated with AST of 185, ALT of 338.  However bilirubin unremarkable.  She has no abdominal pain.  Patient with sodium of 122.  S x-ray shows new bilateral effusions and atelectatic changes.  White count is 12.8.  Troponin is 18.  Overall suspect A-fib with RVR causing heart failure symptoms.  She has no edema in her legs.  No respiratory  symptoms.  We will give her dose IV Lasix.  Heart rate and blood pressure improving on IV diltiazem.  Will admit to [facility] for further TIA work-up and further A-fib with RVR care.  This chart was dictated using voice recognition software.  Despite best efforts to proofread,  errors can occur which can change the documentation meaning.    Final Clinical Impression(s) / ED Diagnoses Final diagnoses:  TIA (transient ischemic attack)  Atrial fibrillation with RVR (HCC)  Pleural effusion  Hyponatremia    Rx / DC Orders ED Discharge Orders     None         Virgina Norfolk, DO 03/30/21 2002    Virgina Norfolk, DO 03/30/21 2008

## 2021-03-30 NOTE — ED Notes (Addendum)
ED TO INPATIENT HANDOFF REPORT ? ?ED Nurse Name and Phone #: Mel Almond ? ?S ?Name/Age/Gender ?[place] ?81 y.o. ?female ?Room/Bed: RESB/RESB ? ?Code Status ?  Code Status: Prior ? ?Home/SNF/Other ?Home ?Patient oriented to: self, place, time, and situation ?Is this baseline? Yes  ? ?Triage Complete: Triage complete  ?Chief Complaint ?TIA (transient ischemic attack) [G45.9] ? ?Triage Note ?BIB friend, he noticed around 5 pm that she was slurring her words and not making any sense. Friend had left around 1pm and came back at 5pm and noticed the change. Ronnald Nian, MD aware.  ? ?Allergies ?No Known Allergies ? ?Level of Care/Admitting Diagnosis ?ED Disposition   ? ? ED Disposition  ?Admit  ? Condition  ?--  ? Comment  ?Hospital Area: [facility] [[id number]] ? Level of Care: Progressive [102] ? Admit to Progressive based on following criteria: CARDIOVASCULAR & THORACIC of moderate stability with acute coronary syndrome symptoms/low risk myocardial infarction/hypertensive urgency/arrhythmias/heart failure potentially compromising stability and stable post cardiovascular intervention patients. ? May place patient in observation at [facility] or [place] if equivalent level of care is available:: No ? Covid Evaluation: Asymptomatic - no recent exposure (last 10 days) testing not required ? Diagnosis: TIA (transient ischemic attack) [387564] ? Admitting Physician: Rise Patience [phone] ? Attending Physician: Rise Patience [phone] ?  ?  ? ?  ? ? ?B ?Medical/Surgery History ?Past Medical History:  ?Diagnosis Date  ? Aortic stenosis   ? a. Echocardiogram (01/11/14):  Mild LVH, EF 60-65%, Gr 1 DD, possible bicuspid AV, severe AS (mean 61 mmHg, peak 104 mmHg), mild MVP of post leaflet, mild MR, PASP 33 mmHg.;  b. 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 Arthritis   ? Atrial fibrillation ([place])   ? post op after CABG+AVR >> Amiodarone  ? CAD (coronary artery disease)   ? a. LHC (01/13/14):  dLM 70 extending into  oLAD, mLAD 40-50, pD1 80, pD2 70, ostial/prox OM1 70 >> CABG (L-LAD, S-OM1, S-D1, S-D2)  // Myoview 10/22: Fixed defect anterolateral wall consistent with artifact, EF 71, no ischemia, low risk  ? Carotid artery occlusion   ? a. s/p L CEA 2013;  b.  Carotid US (1/16):  Bilateral ICA 1-39% // Carotid US 10/22: Bilateral ICA 1-39  ? CLL (chronic lymphocytic leukemia) ([place])   ? slow leukemia---Dr  Murinson  ? H/O exercise stress test   ? NSSTT  ? Hx of cardiovascular stress test   ? a. Nuclear stress test That (4/13): Normal perfusion, EF 74%  ? Hx of echocardiogram   ? Echo (2/16):  Mild LVH, EF 60-65%, no RWMA, Gr 2 DD, AVR ok (mean 9 mmHg), trivial AI, mild MR, mild LAE, PASP 40 mmHg  ? Hypertension   ? PAD (peripheral artery disease) ([place])   ? LE Arterial US 10/22: R - pCFA, oSFA, dSFA 30-49; L - pCFA + mSFA 50-74, pPop 30-49 >> referred to [facility]  ? Pancreatitis   ? h/o  ? Thrombocytopenia ([place]) 11/19/2010  ? ?Past Surgical History:  ?Procedure Laterality Date  ? ABDOMINAL HYSTERECTOMY    ? AORTIC VALVE REPLACEMENT [name] 01/17/2014  ? Procedure: AORTIC VALVE REPLACEMENT (AVR);  Surgeon: Gaye Pollack, MD;  Location: [place];  Service: Open Heart Surgery;  Laterality: [name];  ? APPENDECTOMY    ? CAROTID ENDARTERECTOMY  06/09/11  ? LEFT  cea  ? CESAREAN SECTION    ? FIVE  ? CHOLECYSTECTOMY    ? CORONARY ARTERY BYPASS  GRAFT [name] 01/17/2014  ? Procedure: CORONARY ARTERY BYPASS GRAFTING (CABG)TIMES FOUR USING LEFT INTERNAL MAMMARY ARTERY AND RIGHT SAPHENOUS VEIN HARVESTED ENDOSCOPICALLY;  Surgeon: Gaye Pollack, MD;  Location: [place] OR;  Service: Open Heart Surgery;  Laterality: [name];  ? ENDARTERECTOMY  06/09/2011  ? Procedure: ENDARTERECTOMY CAROTID;  Surgeon: Rosetta Posner, MD;  Location: [facility] OR;  Service: Vascular;  Laterality: Left;  left carotid endarterectomy with patch angioplasty  ? LEFT AND RIGHT HEART CATHETERIZATION WITH CORONARY ANGIOGRAM [name] 01/13/2014  ? Procedure: LEFT AND RIGHT HEART CATHETERIZATION WITH CORONARY ANGIOGRAM;   Surgeon: Jettie Booze, MD;  Location: [facility] CATH LAB;  Service: Cardiovascular;  Laterality: [name];  ? TEE WITHOUT CARDIOVERSION [name] 01/17/2014  ? Procedure: TRANSESOPHAGEAL ECHOCARDIOGRAM (TEE);  Surgeon: Gaye Pollack, MD;  Location: [place];  Service: Open Heart Surgery;  Laterality: [name];  ? TONSILLECTOMY    ?  ? ?A ?IV Location/Drains/Wounds ?Patient Lines/Drains/Airways Status   ? ? Active Line/Drains/Airways   ? ? Name Placement date Placement time Site Days  ? Peripheral IV 03/30/21 20 G Anterior;Right Forearm 03/30/21  1840  Forearm  less than 1  ? Peripheral IV 03/30/21 20 G Left Antecubital 03/30/21  1905  Antecubital  less than 1  ? ?  ?  ? ?  ? ? ?Intake/Output Last 24 hours ?No intake or output data in the 24 hours ending 03/30/21 2147 ? ?Labs/Imaging ?Results for orders placed or performed during the hospital encounter of 03/30/21 (from the past 48 hour(s))  ?Protime-INR     Status: Abnormal  ? Collection Time: 03/30/21  6:41 PM  ?Result Value Ref Range  ? Prothrombin Time 17.3 (H) 11.4 - 15.2 seconds  ? INR 1.4 (H) 0.8 - 1.2  ?  Comment: (NOTE) ?INR goal varies based on device and disease states. ?Performed at [facility], [address] ?[place], [place] [address] ?  ?APTT     Status: None  ? Collection Time: 03/30/21  6:41 PM  ?Result Value Ref Range  ? aPTT 27 24 - 36 seconds  ?  Comment: Performed at [facility], [place] [address] [place], [place] [address]  ?CBC     Status: Abnormal  ? Collection Time: 03/30/21  6:41 PM  ?Result Value Ref Range  ? WBC 12.8 (H) 4.0 - 10.5 K/uL  ? RBC 4.07 3.87 - 5.11 MIL/uL  ? Hemoglobin 13.7 12.0 - 15.0 g/dL  ? HCT 40.1 36.0 - 46.0 %  ? MCV 98.5 80.0 - 100.0 fL  ? MCH 33.7 26.0 - 34.0 pg  ? MCHC 34.2 30.0 - 36.0 g/dL  ? RDW 14.0 11.5 - 15.5 %  ? Platelets 135 (L) 150 - 400 K/uL  ?  Comment: Immature Platelet Fraction may be ?clinically indicated, consider ?ordering this additional test ?[id number] ?  ? nRBC 0.5 (H) 0.0 - 0.2 %   ?  Comment: Performed at [facility], [place] [address] [place], [place] [address]  ?Differential     Status: Abnormal  ? Collection Time: 03/30/21  6:41 PM  ?Result Value Ref Range  ? Neutrophils Relative % 59 %  ? Neutro Abs 7.6 1.7 - 7.7 K/uL  ? Lymphocytes Relative 28 %  ? Lymphs Abs 3.5 0.7 - 4.0 K/uL  ? Monocytes Relative 12 %  ? Monocytes Absolute 1.5 (H) 0.1 - 1.0 K/uL  ? Eosinophils Relative 0 %  ? Eosinophils Absolute 0.0 0.0 - 0.5 K/uL  ? Basophils Relative  0 %  ? Basophils Absolute 0.0 0.0 - 0.1 K/uL  ? nRBC HIDE 0 /100 WBC  ? Immature Granulocytes 1 %  ? Abs Immature Granulocytes 0.09 (H) 0.00 - 0.07 K/uL  ?  Comment: Performed at [facility], [place] [address] [place], [place] [address]  ?Comprehensive metabolic panel     Status: Abnormal  ? Collection Time: 03/30/21  6:41 PM  ?Result Value Ref Range  ? Sodium 122 (L) 135 - 145 mmol/L  ? Potassium 5.1 3.5 - 5.1 mmol/L  ? Chloride 87 (L) 98 - 111 mmol/L  ? CO2 24 22 - 32 mmol/L  ? Glucose, Bld 118 (H) 70 - 99 mg/dL  ?  Comment: Glucose reference range applies only to samples taken after fasting for at least 8 hours.  ? BUN 24 (H) 8 - 23 mg/dL  ? Creatinine, Ser 0.98 0.44 - 1.00 mg/dL  ? Calcium 9.8 8.9 - 10.3 mg/dL  ? Total Protein 6.2 (L) 6.5 - 8.1 g/dL  ? Albumin 3.6 3.5 - 5.0 g/dL  ? AST 185 (H) 15 - 41 U/L  ? ALT 338 (H) 0 - 44 U/L  ? Alkaline Phosphatase 116 38 - 126 U/L  ? Total Bilirubin 1.4 (H) 0.3 - 1.2 mg/dL  ? GFR, Estimated 58 (L) >60 mL/min  ?  Comment: (NOTE) ?Calculated using the CKD-EPI Creatinine Equation (2021) ?  ? Anion gap 11 5 - 15  ?  Comment: Performed at [facility], [place] [address], [place], [place] [address]  ?Troponin I (High Sensitivity)     Status: Abnormal  ? Collection Time: 03/30/21  6:41 PM  ?Result Value Ref Range  ? Troponin I (High Sensitivity) 18 (H) <18 ng/L  ?  Comment: (NOTE) ?Elevated high sensitivity troponin I (hsTnI) values and significant  ?changes  across serial measurements may suggest ACS but many other  ?chronic and acute conditions are known to elevate hsTnI results.  ?Refer to the "Links" section for chest pain algorithms and additional  ?guida

## 2021-03-30 NOTE — Consult Note (Signed)
[facility] Telemedicine Consult ? ? ?Requesting Provider:  Dr Ronnald Nian ?Consult Participants: Dr. Jerelyn Charles, Telespecialist RN Nira Conn   Bedside RN Kandice Moos ?Location of the provider: Home  ?Location of the patient: [facility] ? ?This consult was provided via telemedicine with 2-way video and audio communication. The patient/family was informed that care would be provided in this way and agreed to receive care in this manner.  ? ?Chief Complaint: aphasia, confusion, improving symptoms ? ?HPI:  ?80/F with PMH of Afib on Eliquis (compliant-last dose this AM), CAD s/p CABG, bilateral carotid stenosis and Left CEA in 2013, CLL, HTN, PAD and thrombocytopenia - presented to ER via POV for confusion and speech not making sense. ?LKW 1600hrs. An hour later, checked on by the friend, who is also the caregiver, went to check on her for dinner and found that her speech was not making sense.  She was having trouble with word finding and appeared confused.  She was brought into the hospital for emergent evaluation.  Her speech was also noticed to be slurred.  Also noted to be in A-fib with RVR.  Her symptoms started to improve a few minutes after arrival-initial examination by the ED be noted patient to be pretty densely aphasic but now she is able to tell her name, the fact that she is in the hospital and able to repeat some sentences. ?CT head was completed prior to the notification to the telestroke nurse. ?Time page received: 1845 hrs. ?Time on camera 1847 hrs. ? ?No recent surgeries. ?Last dose of Eliquis this morning ? ?Recent discharge from the hospital for heart rhythm issues.  Has not been feeling well since requiring assistance from the caregiver to wheel her in a wheelchair but prior to that hospitalization couple weeks ago, was fairly independent.  Did require help on and off with cooking and ADLs but mostly independent but after the discharge on 03/21/2021, has been not back to her normal baseline in  terms of her energy and has had no appetite and required more help. ? ?Past Medical History:  ?Diagnosis Date  ? Aortic stenosis   ? a. Echocardiogram (01/11/14):  Mild LVH, EF 60-65%, Gr 1 DD, possible bicuspid AV, severe AS (mean 61 mmHg, peak 104 mmHg), mild MVP of post leaflet, mild MR, PASP 33 mmHg.;  b. 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 Arthritis   ? Atrial fibrillation ([place])   ? post op after CABG+AVR >> Amiodarone  ? CAD (coronary artery disease)   ? a. LHC (01/13/14):  dLM 70 extending into oLAD, mLAD 40-50, pD1 80, pD2 70, ostial/prox OM1 70 >> CABG (L-LAD, S-OM1, S-D1, S-D2)  // Myoview 10/22: Fixed defect anterolateral wall consistent with artifact, EF 71, no ischemia, low risk  ? Carotid artery occlusion   ? a. s/p L CEA 2013;  b.  Carotid US (1/16):  Bilateral ICA 1-39% // Carotid US 10/22: Bilateral ICA 1-39  ? CLL (chronic lymphocytic leukemia) ([place])   ? slow leukemia---Dr  Murinson  ? H/O exercise stress test   ? NSSTT  ? Hx of cardiovascular stress test   ? a. Nuclear stress test That (4/13): Normal perfusion, EF 74%  ? Hx of echocardiogram   ? Echo (2/16):  Mild LVH, EF 60-65%, no RWMA, Gr 2 DD, AVR ok (mean 9 mmHg), trivial AI, mild MR, mild LAE, PASP 40 mmHg  ? Hypertension   ? PAD (peripheral artery disease) ([place])   ? LE Arterial US 10/22: R -  pCFA, oSFA, dSFA 30-49; L - pCFA + mSFA 50-74, pPop 30-49 >> referred to [facility]  ? Pancreatitis   ? h/o  ? Thrombocytopenia ([place]) 11/19/2010  ? ? ? ?Current Facility-Administered Medications:  ?  [COMPLETED] diltiazem (CARDIZEM) 1 mg/mL load via infusion 10 mg, 10 mg, Intravenous, Once, 10 mg at 03/30/21 1848 **AND** diltiazem (CARDIZEM) 125 mg in dextrose 5% 125 mL (1 mg/mL) infusion, 5-15 mg/hr, Intravenous, Continuous, Curatolo, Adam, DO, Last Rate: 5 mL/hr at 03/30/21 1858, 5 mg/hr at 03/30/21 1858 ? ?Current Outpatient Medications:  ?  acetaminophen (TYLENOL) 325 MG tablet, Take 650 mg by mouth every 6 (six) hours as needed for mild pain or headache. , Disp:  , Rfl:  ?  apixaban (ELIQUIS) 2.5 MG TABS tablet, Take 1 tablet (2.5 mg total) by mouth 2 (two) times daily., Disp: 60 tablet, Rfl: 0 ?  aspirin 81 MG tablet, Take 81 mg by mouth daily., Disp: , Rfl:  ?  atorvastatin (LIPITOR) 10 MG tablet, TAKE 1 TABLET BY MOUTH ONCE DAILY AT  6  IN  THE  EVENING (Patient taking differently: Take 10 mg by mouth daily.), Disp: 90 tablet, Rfl: 2 ?  Calcium Carb-Cholecalciferol 500-125 MG-UNIT TABS, Take 1 tablet by mouth daily., Disp: , Rfl:  ?  metoprolol tartrate (LOPRESSOR) 100 MG tablet, Take 1 tablet (100 mg total) by mouth 2 (two) times daily., Disp: 180 tablet, Rfl: 3 ?  Multiple Vitamins-Minerals (HM MULTIVITAMIN ADULT GUMMY PO), Take 1 tablet by mouth daily., Disp: , Rfl:  ?  oxybutynin (DITROPAN-XL) 10 MG 24 hr tablet, Take 10 mg by mouth daily., Disp: , Rfl:  ?  oxybutynin (DITROPAN-XL) 5 MG 24 hr tablet, Take 5 mg by mouth at bedtime., Disp: , Rfl:  ? ?LKW: 1600 hrs. ?tpa given?: No, on Eliquis-last dose this morning ?IR Thrombectomy? No, no LVO ?Modified Rankin Scale: 2-Slight disability-UNABLE to perform all activities but does not need assistance -has been somewhat more disabled due to a recent hospitalization 2 weeks ago but to the best of my history taking modified Rankin's 2 ?Time of teleneurologist evaluation: 6440 ? ?Exam: ?Vitals:  ? 03/30/21 1805  ?BP: (!) 164/103  ?Pulse: (!) 145  ?Resp: 16  ?Temp: 97.6 ?F (36.4 ?C)  ?SpO2: 95%  ? ? ?General: Awake alert in no distress ?Neurological exam ?Awake alert oriented x3 ?Mild hesitancy and speaking.  No gross dysarthria ?Naming intact, repetition intact, able to follow simple commands consistently but is having some trouble following complex commands. ?Cranial nerves II to XII intact ?Motor examination with no drift ?Sensation intact to light touch ?Coordination with no dysmetria ? ? ?NIHSS ?1A: Level of Consciousness - 0 ?1B: Ask Month and Age - 0 ?1C: 'Blink Eyes' & 'Squeeze Hands' - 0 ?2: Test Horizontal Extraocular  Movements - 0 ?3: Test Visual Fields - 0 ?4: Test Facial Palsy - 0 ?5A: Test Left Arm Motor Drift - 0 ?5B: Test Right Arm Motor Drift - 0 ?6A: Test Left Leg Motor Drift - 0 ?6B: Test Right Leg Motor Drift - 0 ?7: Test Limb Ataxia - 0 ?8: Test Sensation - 0 ?9: Test Language/Aphasia- 1 ?10: Test Dysarthria - 0 ?11: Test Extinction/Inattention - 0 ?NIHSS score: 1 ? ? ?Imaging Reviewed:  ?CT head done stat--no bleed.  Aspects 10.  Global parenchymal volume loss, small remote lacunar infarcts in the right basal ganglia and insula.  Bifrontal atrophy question hygromas versus accelerated changes associated with aging. ? ?Labs reviewed in epic and pertinent  values follow: ?CBC ?   ?Component Value Date/Time  ? WBC 7.8 03/21/2021 0353  ? RBC 3.71 (L) 03/21/2021 0353  ? HGB 12.3 03/21/2021 0353  ? HGB 12.2 06/16/2018 1145  ? HGB 13.4 12/18/2016 0916  ? HCT 36.4 03/21/2021 0353  ? HCT 34.6 06/16/2018 1145  ? HCT 40.9 12/18/2016 0916  ? PLT 168 03/21/2021 0353  ? PLT 92 (LL) 06/16/2018 1145  ? MCV 98.1 03/21/2021 0353  ? MCV 93 06/16/2018 1145  ? MCV 92.3 12/18/2016 0916  ? MCH 33.2 03/21/2021 0353  ? MCHC 33.8 03/21/2021 0353  ? RDW 12.5 03/21/2021 0353  ? RDW 12.0 06/16/2018 1145  ? RDW 13.1 12/18/2016 0916  ? LYMPHSABS 3.0 11/30/2020 0931  ? LYMPHSABS 4.7 (H) 12/18/2016 1610  ? MONOABS 0.7 11/30/2020 0931  ? MONOABS 1.0 (H) 12/18/2016 9604  ? EOSABS 0.0 11/30/2020 0931  ? EOSABS 0.1 12/18/2016 0916  ? BASOSABS 0.0 11/30/2020 0931  ? BASOSABS 0.1 12/18/2016 0916  ? ?CMP  ?   ?Component Value Date/Time  ? NA 133 (L) 03/21/2021 0353  ? NA 137 04/05/2019 0829  ? NA 137 12/18/2016 0916  ? K 3.4 (L) 03/21/2021 0353  ? K 4.4 12/18/2016 0916  ? CL 102 03/21/2021 0353  ? CL 105 05/20/2012 0823  ? CO2 25 03/21/2021 0353  ? CO2 25 12/18/2016 0916  ? GLUCOSE 115 (H) 03/21/2021 0353  ? GLUCOSE 88 12/18/2016 0916  ? GLUCOSE 70 05/20/2012 0823  ? BUN 14 03/21/2021 0353  ? BUN 21 04/05/2019 0829  ? BUN 14.0 12/18/2016 0916  ? CREATININE  0.83 03/21/2021 0353  ? CREATININE 1.0 12/18/2016 0916  ? CALCIUM 8.2 (L) 03/21/2021 0353  ? CALCIUM 9.9 12/18/2016 0916  ? PROT 5.0 (L) 03/21/2021 0353  ? PROT 6.1 (L) 12/18/2016 0916  ? ALBUMIN 2.9 (L) 0

## 2021-03-30 NOTE — ED Notes (Signed)
Report received by previous RN. Tele-neurology at bedside. Pt transported to CT for stat CTA at this time by this RN.  ?

## 2021-03-30 NOTE — Progress Notes (Signed)
Code Stroke Activated at [place]. Patient already back from CT at this time. Dr. Rory Percy paged '@1845'$  and arrived on camera '@1847'$ . NIH 1 done at [place]. mRS 3.  ?CT Head IMPRESSION: ?1. No acute intracranial hemorrhage or infarct. ?2. ASPECTS is 10 ?3. 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 Small remote lacunar infarcts in the right ?basal ganglia and left insula. ?Dr. Rory Percy saw these results and ordered CTA. ?CTA IMPRESSION: ?1. Mixed plaque in the proximal right internal carotid artery ?resulting in less than 50% stenosis. There is minimal plaque in the ?left carotid system without hemodynamically significant stenosis or ?occlusion. ?2. Mild stenosis at the origin of the left vertebral artery and in ?the V2 segment. Otherwise, patent vertebral arteries. ?3. Intracranial atherosclerotic disease resulting in ?mild-to-moderate stenosis of the left supraclinoid ICA, moderate ?stenosis of the left V4 segment, and mild stenosis of the left P2 ?segment. No proximal high-grade stenosis or occlusion. ?4. Large bilateral pleural effusions, new since the CTA chest from ?03/19/2021. ?Dr. Rory Percy reviewed these images.  ?

## 2021-03-30 NOTE — ED Triage Notes (Addendum)
BIB friend, he noticed around 5 pm that she was slurring her words and not making any sense. Friend had left around 1pm and came back at 5pm and noticed the change. Ronnald Nian, MD aware. ?

## 2021-03-31 ENCOUNTER — Observation Stay (HOSPITAL_COMMUNITY): Payer: Medicare Other

## 2021-03-31 DIAGNOSIS — N390 Urinary tract infection, site not specified: Secondary | ICD-10-CM | POA: Diagnosis present

## 2021-03-31 DIAGNOSIS — I480 Paroxysmal atrial fibrillation: Secondary | ICD-10-CM | POA: Diagnosis present

## 2021-03-31 DIAGNOSIS — C9110 Chronic lymphocytic leukemia of B-cell type not having achieved remission: Secondary | ICD-10-CM | POA: Diagnosis present

## 2021-03-31 DIAGNOSIS — I3139 Other pericardial effusion (noninflammatory): Secondary | ICD-10-CM | POA: Diagnosis not present

## 2021-03-31 DIAGNOSIS — E1151 Type 2 diabetes mellitus with diabetic peripheral angiopathy without gangrene: Secondary | ICD-10-CM | POA: Diagnosis present

## 2021-03-31 DIAGNOSIS — K760 Fatty (change of) liver, not elsewhere classified: Secondary | ICD-10-CM | POA: Diagnosis present

## 2021-03-31 DIAGNOSIS — N183 Chronic kidney disease, stage 3 unspecified: Secondary | ICD-10-CM | POA: Diagnosis present

## 2021-03-31 DIAGNOSIS — E1122 Type 2 diabetes mellitus with diabetic chronic kidney disease: Secondary | ICD-10-CM | POA: Diagnosis present

## 2021-03-31 DIAGNOSIS — E785 Hyperlipidemia, unspecified: Secondary | ICD-10-CM | POA: Diagnosis present

## 2021-03-31 DIAGNOSIS — G459 Transient cerebral ischemic attack, unspecified: Secondary | ICD-10-CM | POA: Diagnosis present

## 2021-03-31 DIAGNOSIS — D696 Thrombocytopenia, unspecified: Secondary | ICD-10-CM | POA: Diagnosis present

## 2021-03-31 DIAGNOSIS — I2510 Atherosclerotic heart disease of native coronary artery without angina pectoris: Secondary | ICD-10-CM | POA: Diagnosis present

## 2021-03-31 DIAGNOSIS — Z20822 Contact with and (suspected) exposure to covid-19: Secondary | ICD-10-CM | POA: Diagnosis present

## 2021-03-31 DIAGNOSIS — I10 Essential (primary) hypertension: Secondary | ICD-10-CM | POA: Diagnosis not present

## 2021-03-31 DIAGNOSIS — R29701 NIHSS score 1: Secondary | ICD-10-CM | POA: Diagnosis present

## 2021-03-31 DIAGNOSIS — I340 Nonrheumatic mitral (valve) insufficiency: Secondary | ICD-10-CM | POA: Diagnosis not present

## 2021-03-31 DIAGNOSIS — K7200 Acute and subacute hepatic failure without coma: Secondary | ICD-10-CM | POA: Diagnosis present

## 2021-03-31 DIAGNOSIS — E871 Hypo-osmolality and hyponatremia: Secondary | ICD-10-CM | POA: Diagnosis present

## 2021-03-31 DIAGNOSIS — I4891 Unspecified atrial fibrillation: Secondary | ICD-10-CM | POA: Diagnosis not present

## 2021-03-31 DIAGNOSIS — R4701 Aphasia: Secondary | ICD-10-CM | POA: Diagnosis present

## 2021-03-31 DIAGNOSIS — B9620 Unspecified Escherichia coli [E. coli] as the cause of diseases classified elsewhere: Secondary | ICD-10-CM | POA: Diagnosis present

## 2021-03-31 DIAGNOSIS — N179 Acute kidney failure, unspecified: Secondary | ICD-10-CM | POA: Diagnosis not present

## 2021-03-31 DIAGNOSIS — R7989 Other specified abnormal findings of blood chemistry: Secondary | ICD-10-CM | POA: Diagnosis not present

## 2021-03-31 DIAGNOSIS — E861 Hypovolemia: Secondary | ICD-10-CM | POA: Diagnosis present

## 2021-03-31 DIAGNOSIS — Z951 Presence of aortocoronary bypass graft: Secondary | ICD-10-CM | POA: Diagnosis not present

## 2021-03-31 DIAGNOSIS — I130 Hypertensive heart and chronic kidney disease with heart failure and stage 1 through stage 4 chronic kidney disease, or unspecified chronic kidney disease: Secondary | ICD-10-CM | POA: Diagnosis present

## 2021-03-31 DIAGNOSIS — I4819 Other persistent atrial fibrillation: Secondary | ICD-10-CM | POA: Diagnosis not present

## 2021-03-31 DIAGNOSIS — J918 Pleural effusion in other conditions classified elsewhere: Secondary | ICD-10-CM | POA: Diagnosis present

## 2021-03-31 DIAGNOSIS — I5032 Chronic diastolic (congestive) heart failure: Secondary | ICD-10-CM | POA: Diagnosis present

## 2021-03-31 DIAGNOSIS — Z953 Presence of xenogenic heart valve: Secondary | ICD-10-CM | POA: Diagnosis not present

## 2021-03-31 LAB — CBC
HCT: 39 % (ref 36.0–46.0)
Hemoglobin: 13.4 g/dL (ref 12.0–15.0)
MCH: 34.1 pg — ABNORMAL HIGH (ref 26.0–34.0)
MCHC: 34.4 g/dL (ref 30.0–36.0)
MCV: 99.2 fL (ref 80.0–100.0)
Platelets: 125 10*3/uL — ABNORMAL LOW (ref 150–400)
RBC: 3.93 MIL/uL (ref 3.87–5.11)
RDW: 13.9 % (ref 11.5–15.5)
WBC: 9.7 10*3/uL (ref 4.0–10.5)
nRBC: 0.3 % — ABNORMAL HIGH (ref 0.0–0.2)

## 2021-03-31 LAB — CBC WITH DIFFERENTIAL/PLATELET
Abs Immature Granulocytes: 0.04 10*3/uL (ref 0.00–0.07)
Basophils Absolute: 0 10*3/uL (ref 0.0–0.1)
Basophils Relative: 0 %
Eosinophils Absolute: 0 10*3/uL (ref 0.0–0.5)
Eosinophils Relative: 0 %
HCT: 37.6 % (ref 36.0–46.0)
Hemoglobin: 12.9 g/dL (ref 12.0–15.0)
Immature Granulocytes: 0 %
Lymphocytes Relative: 12 %
Lymphs Abs: 1.2 10*3/uL (ref 0.7–4.0)
MCH: 32.9 pg (ref 26.0–34.0)
MCHC: 34.3 g/dL (ref 30.0–36.0)
MCV: 95.9 fL (ref 80.0–100.0)
Monocytes Absolute: 0.9 10*3/uL (ref 0.1–1.0)
Monocytes Relative: 8 %
Neutro Abs: 8.4 10*3/uL — ABNORMAL HIGH (ref 1.7–7.7)
Neutrophils Relative %: 80 %
Platelets: UNDETERMINED 10*3/uL (ref 150–400)
RBC: 3.92 MIL/uL (ref 3.87–5.11)
RDW: 13.8 % (ref 11.5–15.5)
WBC: 10.6 10*3/uL — ABNORMAL HIGH (ref 4.0–10.5)
nRBC: 0.2 % (ref 0.0–0.2)

## 2021-03-31 LAB — HEMOGLOBIN A1C
Hgb A1c MFr Bld: 5.3 % (ref 4.8–5.6)
Mean Plasma Glucose: 105.41 mg/dL

## 2021-03-31 LAB — BASIC METABOLIC PANEL
Anion gap: 11 (ref 5–15)
Anion gap: 12 (ref 5–15)
Anion gap: 15 (ref 5–15)
BUN: 17 mg/dL (ref 8–23)
BUN: 18 mg/dL (ref 8–23)
BUN: 22 mg/dL (ref 8–23)
CO2: 24 mmol/L (ref 22–32)
CO2: 24 mmol/L (ref 22–32)
CO2: 27 mmol/L (ref 22–32)
Calcium: 9.3 mg/dL (ref 8.9–10.3)
Calcium: 9.4 mg/dL (ref 8.9–10.3)
Calcium: 9.7 mg/dL (ref 8.9–10.3)
Chloride: 87 mmol/L — ABNORMAL LOW (ref 98–111)
Chloride: 87 mmol/L — ABNORMAL LOW (ref 98–111)
Chloride: 89 mmol/L — ABNORMAL LOW (ref 98–111)
Creatinine, Ser: 1 mg/dL (ref 0.44–1.00)
Creatinine, Ser: 1.04 mg/dL — ABNORMAL HIGH (ref 0.44–1.00)
Creatinine, Ser: 1.16 mg/dL — ABNORMAL HIGH (ref 0.44–1.00)
GFR, Estimated: 48 mL/min — ABNORMAL LOW (ref >60)
GFR, Estimated: 54 mL/min — ABNORMAL LOW (ref >60)
GFR, Estimated: 57 mL/min — ABNORMAL LOW (ref >60)
Glucose, Bld: 101 mg/dL — ABNORMAL HIGH (ref 70–99)
Glucose, Bld: 112 mg/dL — ABNORMAL HIGH (ref 70–99)
Glucose, Bld: 88 mg/dL (ref 70–99)
Potassium: 3.5 mmol/L (ref 3.5–5.1)
Potassium: 3.7 mmol/L (ref 3.5–5.1)
Potassium: 4.3 mmol/L (ref 3.5–5.1)
Sodium: 122 mmol/L — ABNORMAL LOW (ref 135–145)
Sodium: 126 mmol/L — ABNORMAL LOW (ref 135–145)
Sodium: 128 mmol/L — ABNORMAL LOW (ref 135–145)

## 2021-03-31 LAB — LIPID PANEL
Cholesterol: 96 mg/dL (ref 0–200)
HDL: 36 mg/dL — ABNORMAL LOW (ref >40)
LDL Cholesterol: 33 mg/dL (ref 0–99)
Total CHOL/HDL Ratio: 2.7 RATIO
Triglycerides: 133 mg/dL (ref <150)
VLDL: 27 mg/dL (ref 0–40)

## 2021-03-31 LAB — ALBUMIN, PLEURAL OR PERITONEAL FLUID: Albumin, Fluid: <1.5 g/dL

## 2021-03-31 LAB — HEPATITIS PANEL, ACUTE
HCV Ab: NONREACTIVE
Hep A IgM: NONREACTIVE
Hep B C IgM: NONREACTIVE
Hepatitis B Surface Ag: NONREACTIVE

## 2021-03-31 LAB — HEPATIC FUNCTION PANEL
ALT: 251 U/L — ABNORMAL HIGH (ref 0–44)
AST: 118 U/L — ABNORMAL HIGH (ref 15–41)
Albumin: 3 g/dL — ABNORMAL LOW (ref 3.5–5.0)
Alkaline Phosphatase: 99 U/L (ref 38–126)
Bilirubin, Direct: 0.3 mg/dL — ABNORMAL HIGH (ref 0.0–0.2)
Indirect Bilirubin: 1.2 mg/dL — ABNORMAL HIGH (ref 0.3–0.9)
Total Bilirubin: 1.5 mg/dL — ABNORMAL HIGH (ref 0.3–1.2)
Total Protein: 4.9 g/dL — ABNORMAL LOW (ref 6.5–8.1)

## 2021-03-31 LAB — BODY FLUID CELL COUNT WITH DIFFERENTIAL
Eos, Fluid: 0 %
Lymphs, Fluid: 49 %
Monocyte-Macrophage-Serous Fluid: 39 % — ABNORMAL LOW (ref 50–90)
Neutrophil Count, Fluid: 12 % (ref 0–25)
Total Nucleated Cell Count, Fluid: 78 cu mm (ref 0–1000)

## 2021-03-31 LAB — GRAM STAIN

## 2021-03-31 LAB — ACETAMINOPHEN LEVEL: Acetaminophen (Tylenol), Serum: <10 ug/mL — ABNORMAL LOW (ref 10–30)

## 2021-03-31 LAB — OSMOLALITY, URINE: Osmolality, Ur: 265 mOsm/kg — ABNORMAL LOW (ref 300–900)

## 2021-03-31 LAB — LACTATE DEHYDROGENASE, PLEURAL OR PERITONEAL FLUID: LD, Fluid: 52 U/L — ABNORMAL HIGH (ref 3–23)

## 2021-03-31 LAB — SODIUM, URINE, RANDOM: Sodium, Ur: 91 mmol/L

## 2021-03-31 LAB — TSH: TSH: 2.503 u[IU]/mL (ref 0.350–4.500)

## 2021-03-31 IMAGING — MR MR HEAD W/O CM
10 of 11 series · 43 of 48 positions shown · non-contrast
Comparison: Head CT and CTA from yesterday

CLINICAL DATA: Speech slurring

EXAM:
MRI HEAD WITHOUT CONTRAST
TECHNIQUE: Multiplanar, multiecho pulse sequences of the brain and surrounding
structures were obtained without intravenous contrast.

[Series 5: DWI · axial · 3.0mm · 0.88mm/px · z∈[-71,+75]mm · 10 of 100 slices shown (1 of 4)]
[im 1/100]
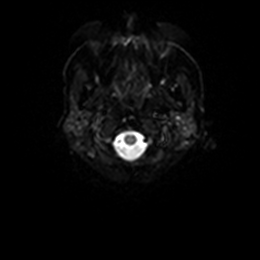
[im 12/100]
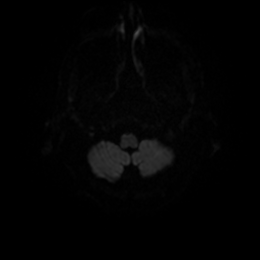
[im 23/100]
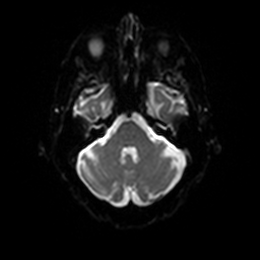
[im 34/100]
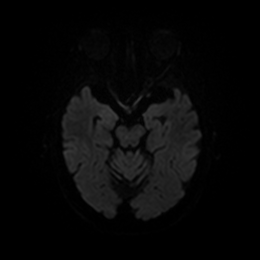
[im 45/100]
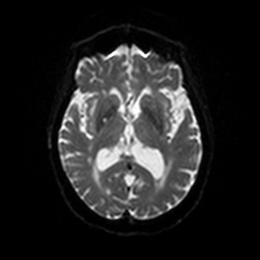
[im 56/100]
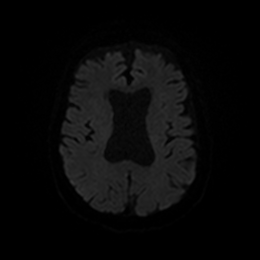
[im 67/100]
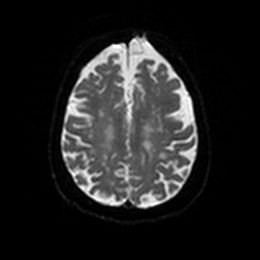
[im 78/100]
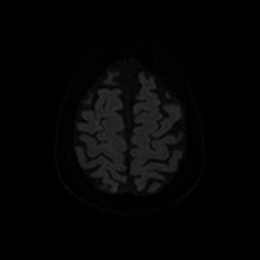
[im 89/100]
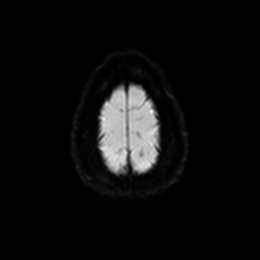
[im 100/100]
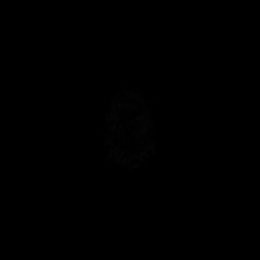

[Series 6: DWI · axial · 3.0mm · 0.88mm/px · z∈[-71,+75]mm · 5 of 50 slices shown (2 of 4)]
[im 1/50]
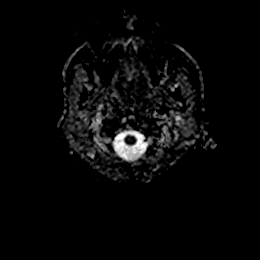
[im 13/50]
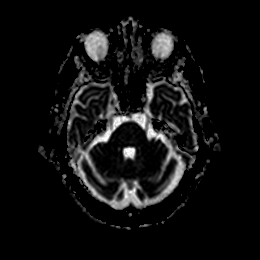
[im 25/50]
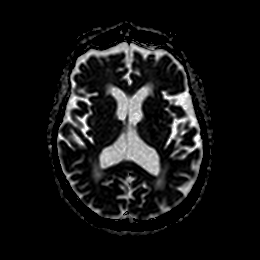
[im 37/50]
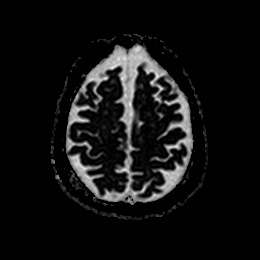
[im 50/50]
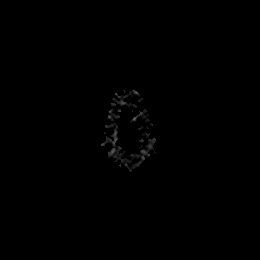

[Series 7: DWI · coronal · 4.0mm · 0.88mm/px · 6 of 64 slices shown (3 of 4)]
[im 1/64]
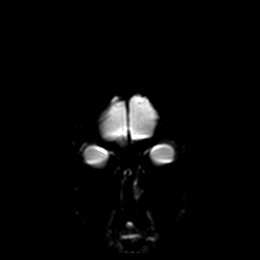
[im 13/64]
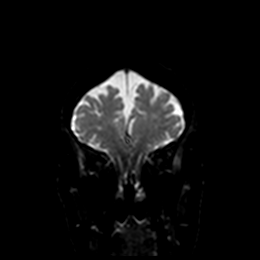
[im 26/64]
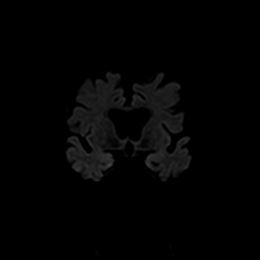
[im 38/64]
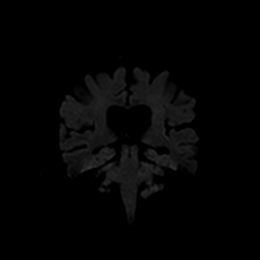
[im 51/64]
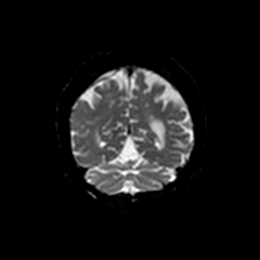
[im 64/64]
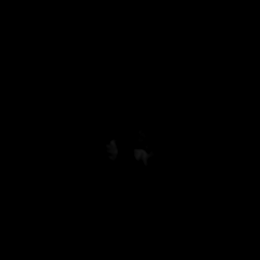

[Series 8: DWI · coronal · 4.0mm · 0.88mm/px · 3 of 32 slices shown (4 of 4)]
[im 1/32]
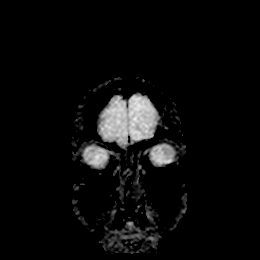
[im 16/32]
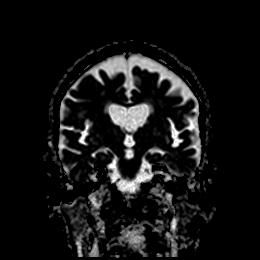
[im 32/32]
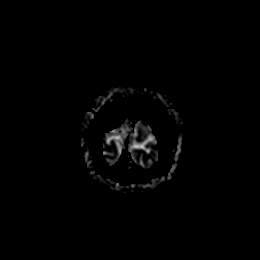

[Series 9: T1 · sagittal · 5.0mm · 0.75mm/px · 2 of 23 slices shown]
[im 1/23]
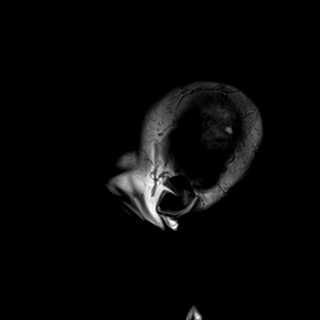
[im 23/23]
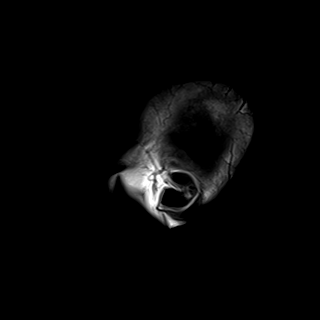

[Series 10: T2 · axial · 5.0mm · 0.72mm/px · z∈[-70,+74]mm · 2 of 25 slices shown (1 of 2)]
[im 1/25]
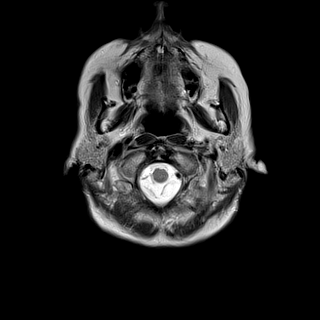
[im 25/25]
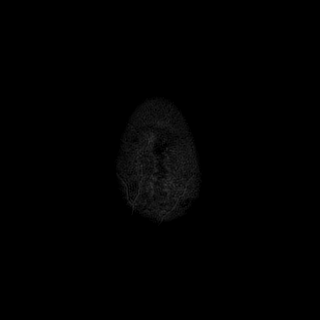

[Series 11: FLAIR · axial · 5.0mm · 0.45mm/px · z∈[-71,+73]mm · 2 of 25 slices shown]
[im 1/25]
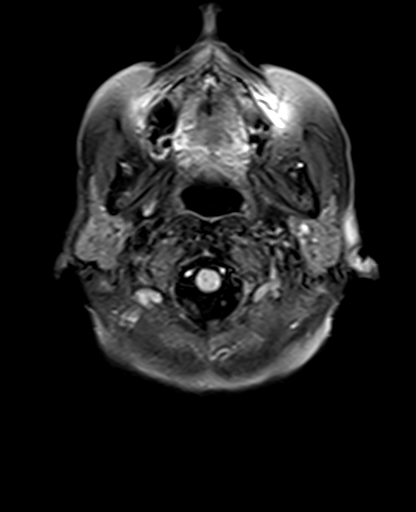
[im 25/25]
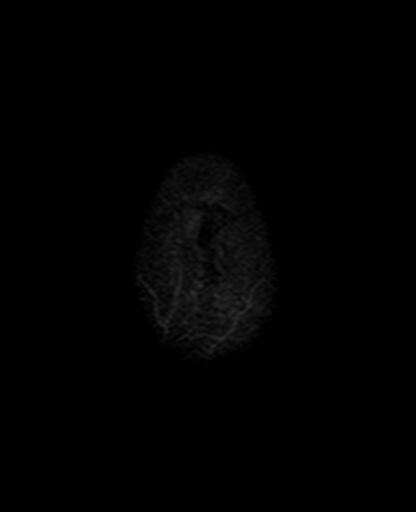

[Series 13: pha_images · axial · 3.0mm · 0.90mm/px · z∈[-88,+83]mm · 5 of 58 slices shown]
[im 1/58]
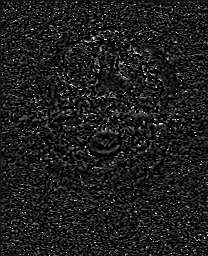
[im 15/58]
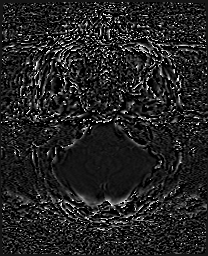
[im 29/58]
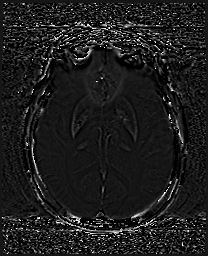
[im 43/58]
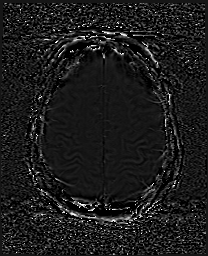
[im 58/58]
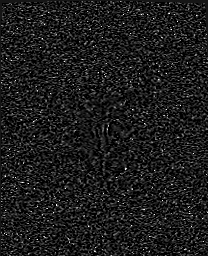

[Series 14: swi_images · axial · 3.0mm · 0.90mm/px · z∈[-88,+89]mm · 5 of 60 slices shown]
[im 1/60]
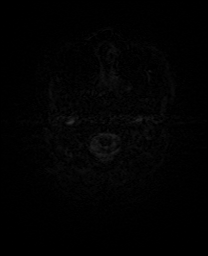
[im 15/60]
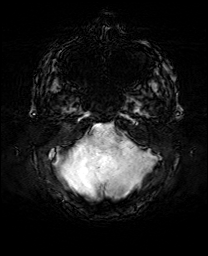
[im 30/60]
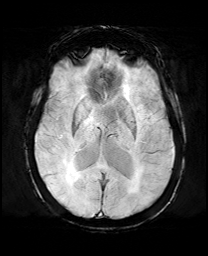
[im 45/60]
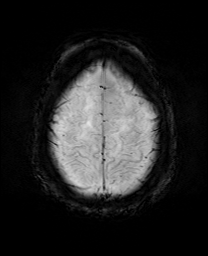
[im 60/60]
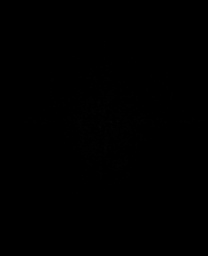

[Series 17: T2 · coronal · 5.0mm · 0.34mm/px · 3 of 29 slices shown (2 of 2)]
[im 1/29]
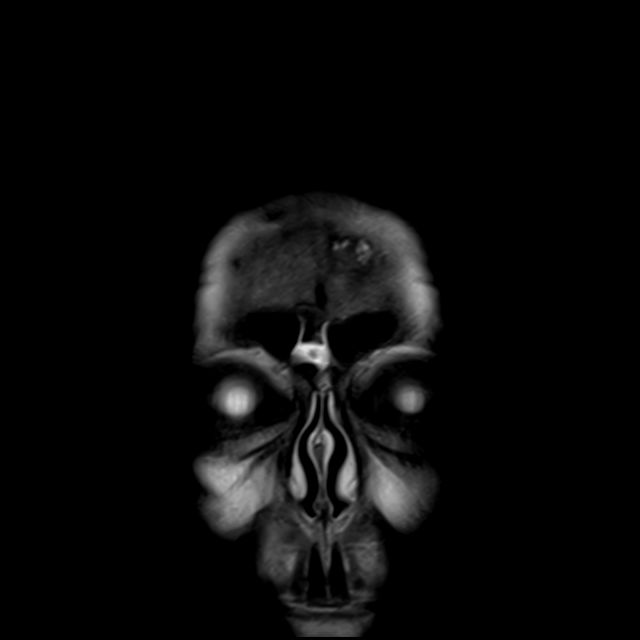
[im 15/29]
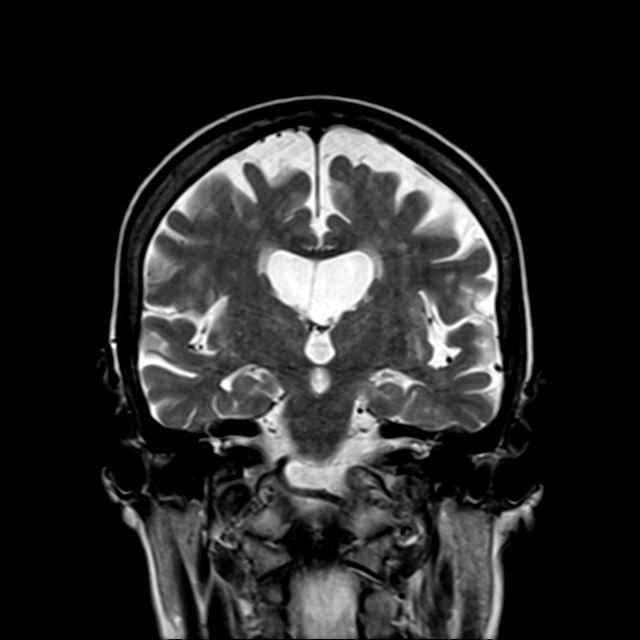
[im 29/29]
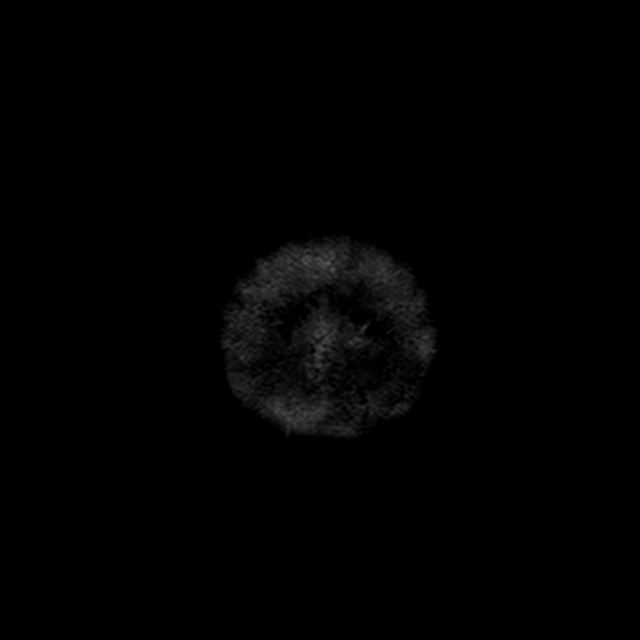

[43 of 48 positions shown; findings below may reference images not displayed]

FINDINGS: Brain: No acute infarction, hemorrhage, hydrocephalus, extra-axial
collection or mass lesion. Small scattered remote cerebellar
infarcts, mainly right-sided. Chronic lacunar infarct at the right
putamen. Ischemic gliosis in the deep white matter. Mild for age and
generalized cerebral volume loss.

Vascular: Normal flow voids. Strong left vertebral artery dominance.

Skull and upper cervical spine: Normal marrow signal.

Sinuses/Orbits: Bilateral cataract resection. Minor right frontal
sinus opacification
IMPRESSION: 1. No acute finding.
2. Moderate chronic small vessel ischemia. Small remote cerebellar
infarcts.

## 2021-03-31 IMAGING — US US ABDOMEN LIMITED
1 series · 14 of 25 positions shown · non-contrast
Comparison: CT [DATE], [DATE]

CLINICAL DATA: Abdominal pain

EXAM:
ULTRASOUND ABDOMEN LIMITED RIGHT UPPER QUADRANT

[Series 1: us abdomen limited ruq (liver/gb) · 39 acquisitions, 14 frames shown]
[im 1/39]
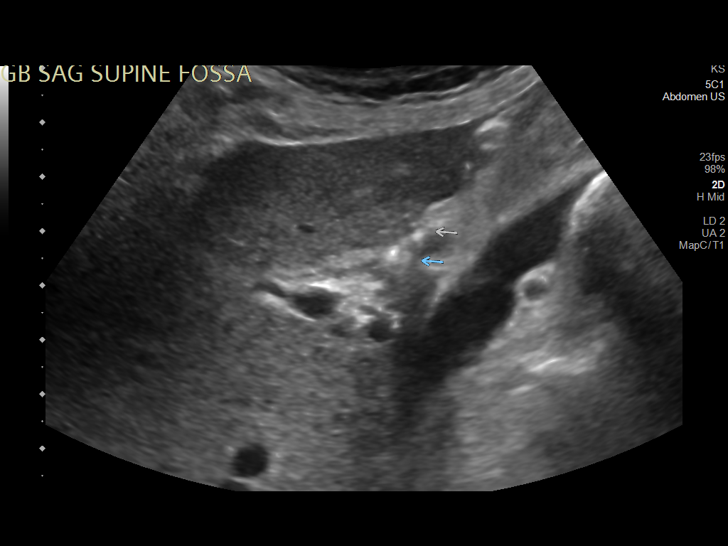
[im 4/39]
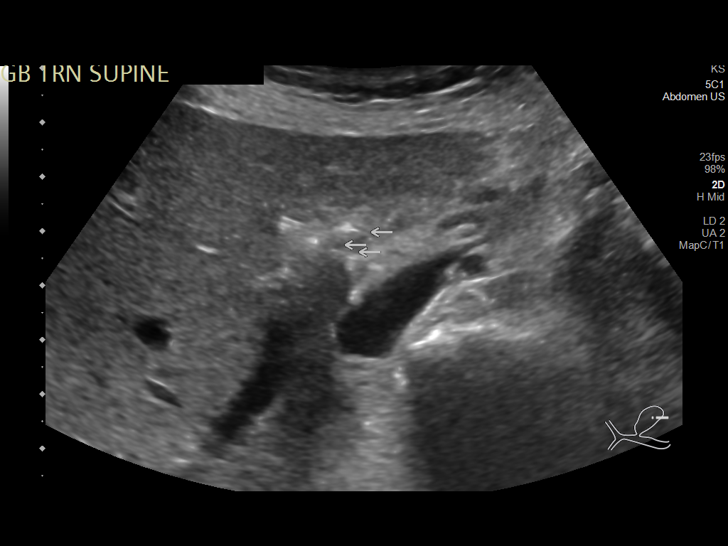
[im 7/39]
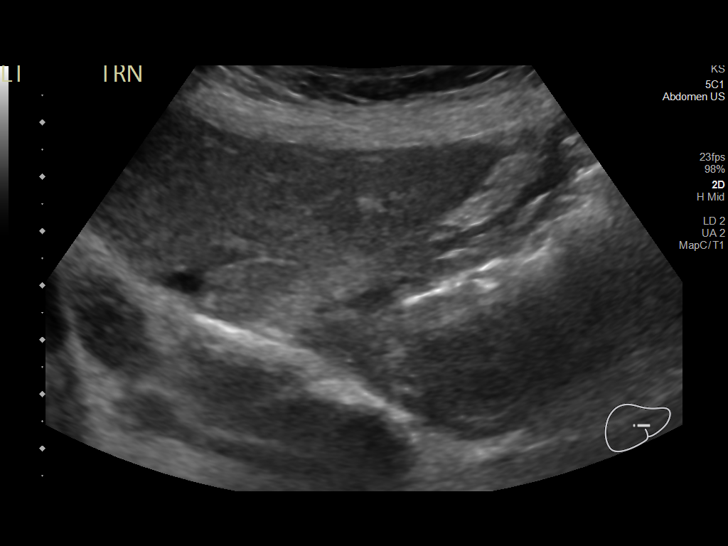
[im 10/39]
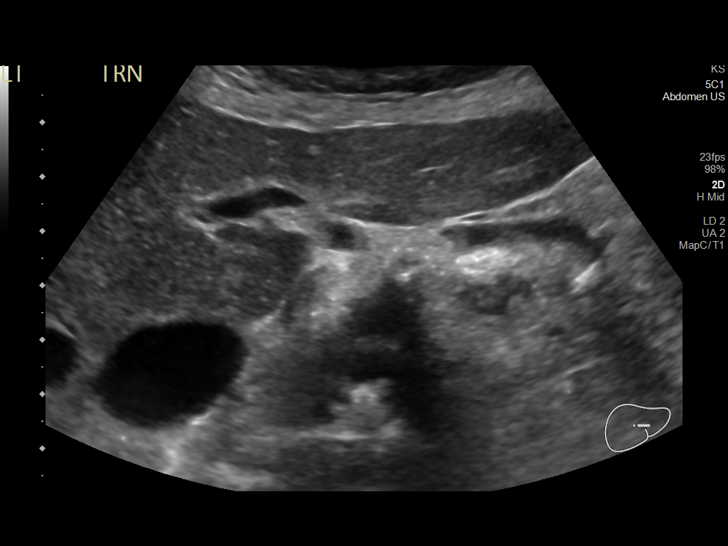
[im 13/39]
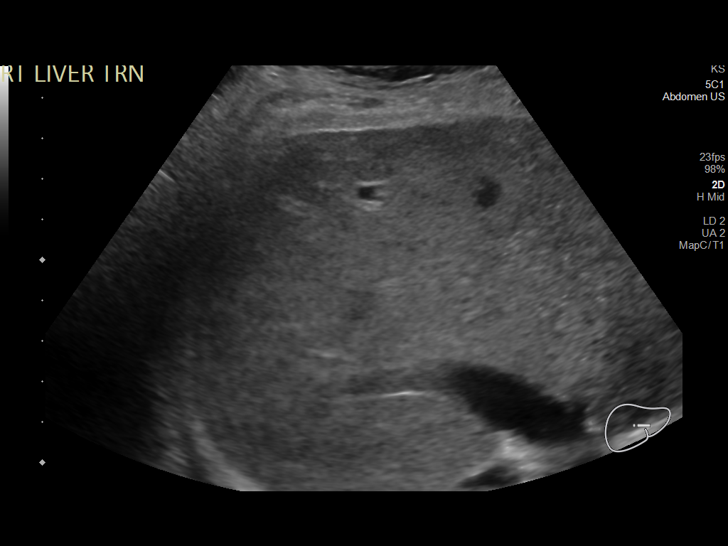
[im 15/39]
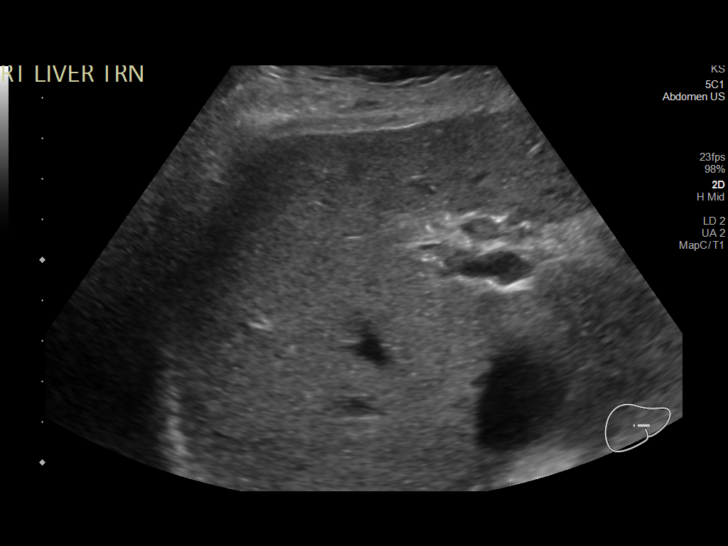
[im 18/39]
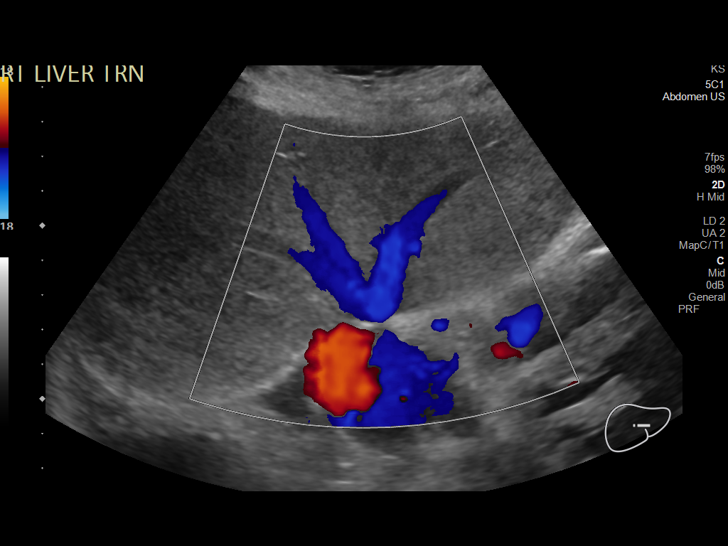
[im 21/39]
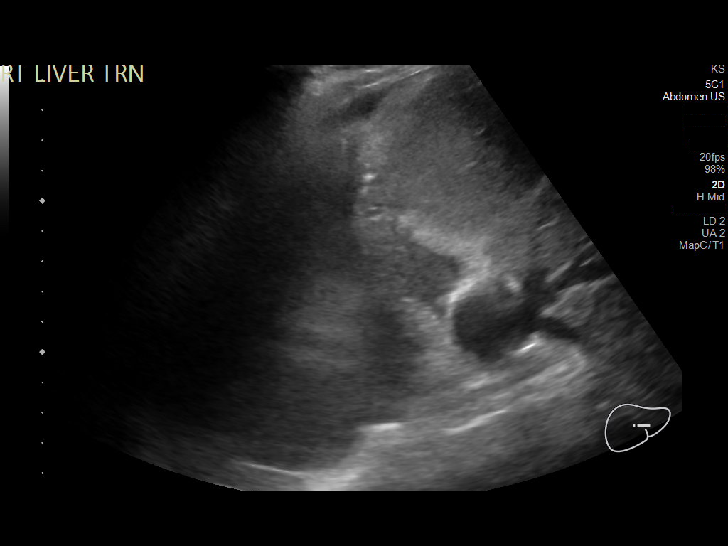
[im 24/39]
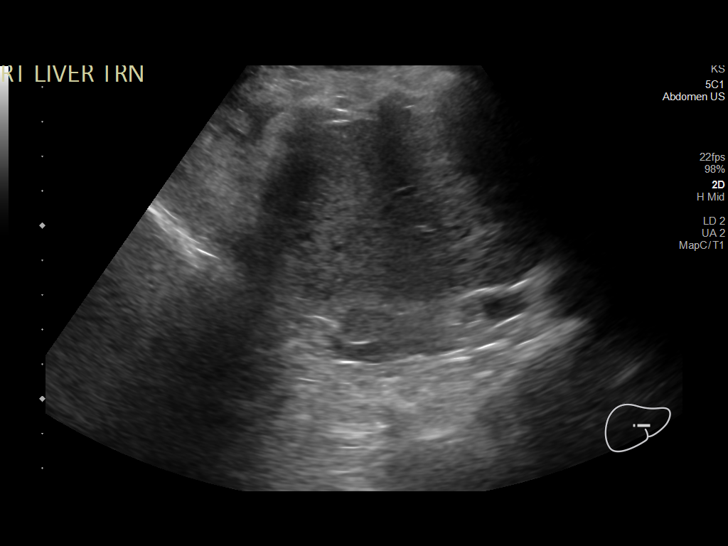
[im 26/39]
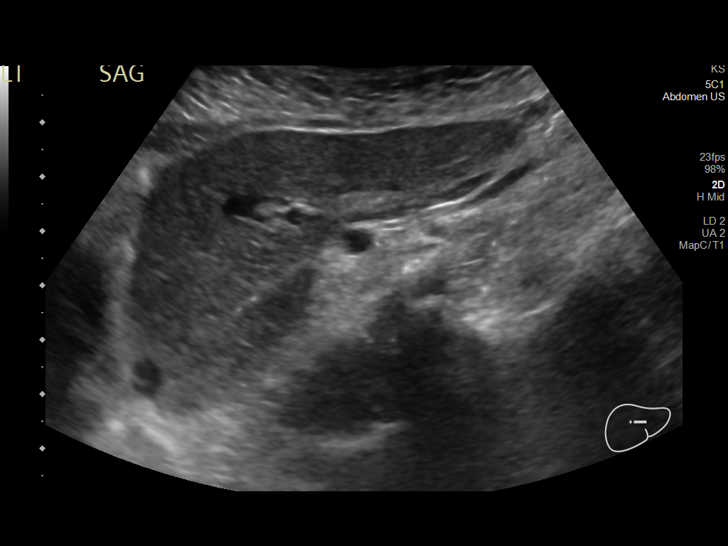
[im 29/39]
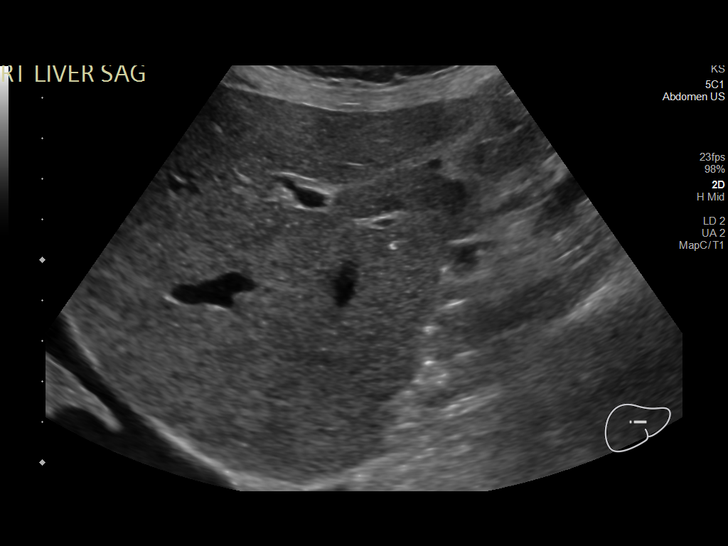
[im 32/39]
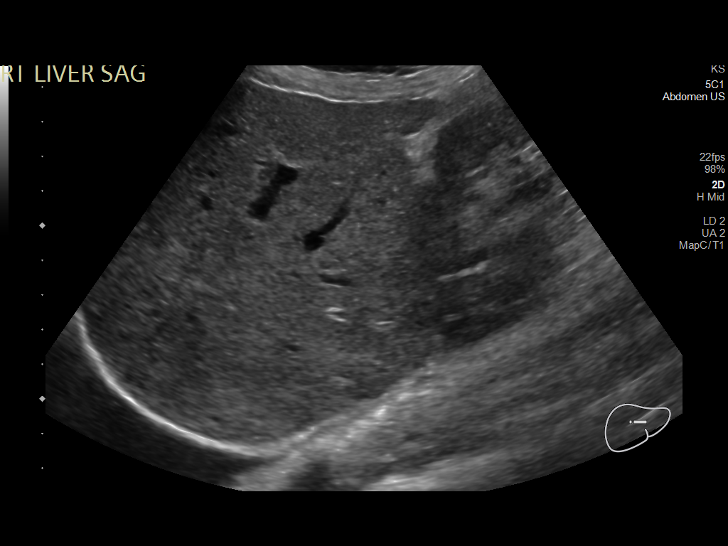
[im 35/39]
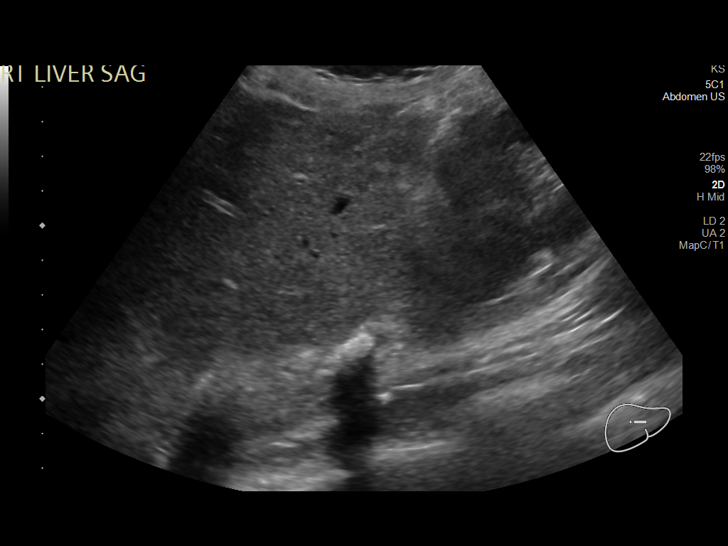
[im 39/39]
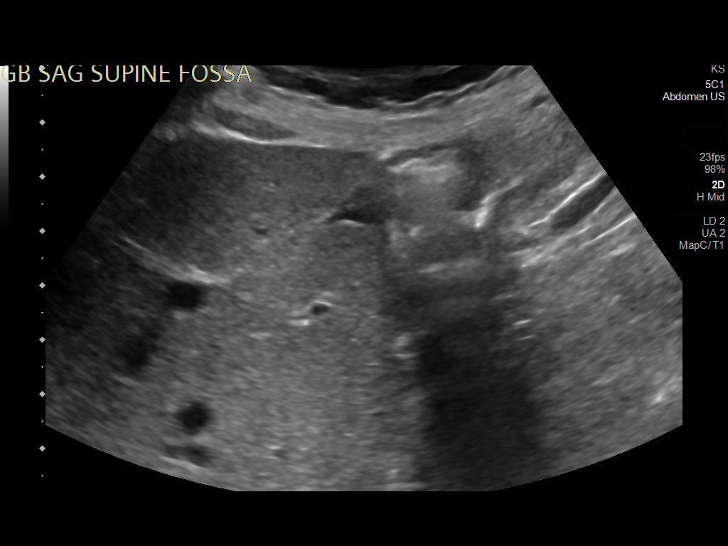

[14 of 25 positions shown; findings below may reference images not displayed]

FINDINGS: Gallbladder:

Surgically absent.

Common bile duct:

Diameter: 3 mm.

Liver:

Scattered reflectors within the central intrahepatic biliary tree
compatible with pneumobilia seen on previous CT. No focal lesion
identified. Mildly increased hepatic parenchymal echogenicity.
Portal vein is patent on color Doppler imaging with normal direction
of blood flow towards the liver.

Other: Right-sided pleural effusion noted.
IMPRESSION: 1. The echogenicity of the liver is mildly increased. This is a
nonspecific finding but is most commonly seen with fatty
infiltration of the liver. There are no obvious focal liver lesions.
2. Right-sided pleural effusion.
3. Prior cholecystectomy. Pneumobilia again seen within the liver,
likely related to prior instrumentation.

## 2021-03-31 IMAGING — CR DG CHEST 1V
1 series · 1 of 1 positions shown · non-contrast
Comparison: Chest radiograph 1 day prior

CLINICAL DATA: Status post thoracentesis

EXAM:
CHEST  1 VIEW

[chest ap]
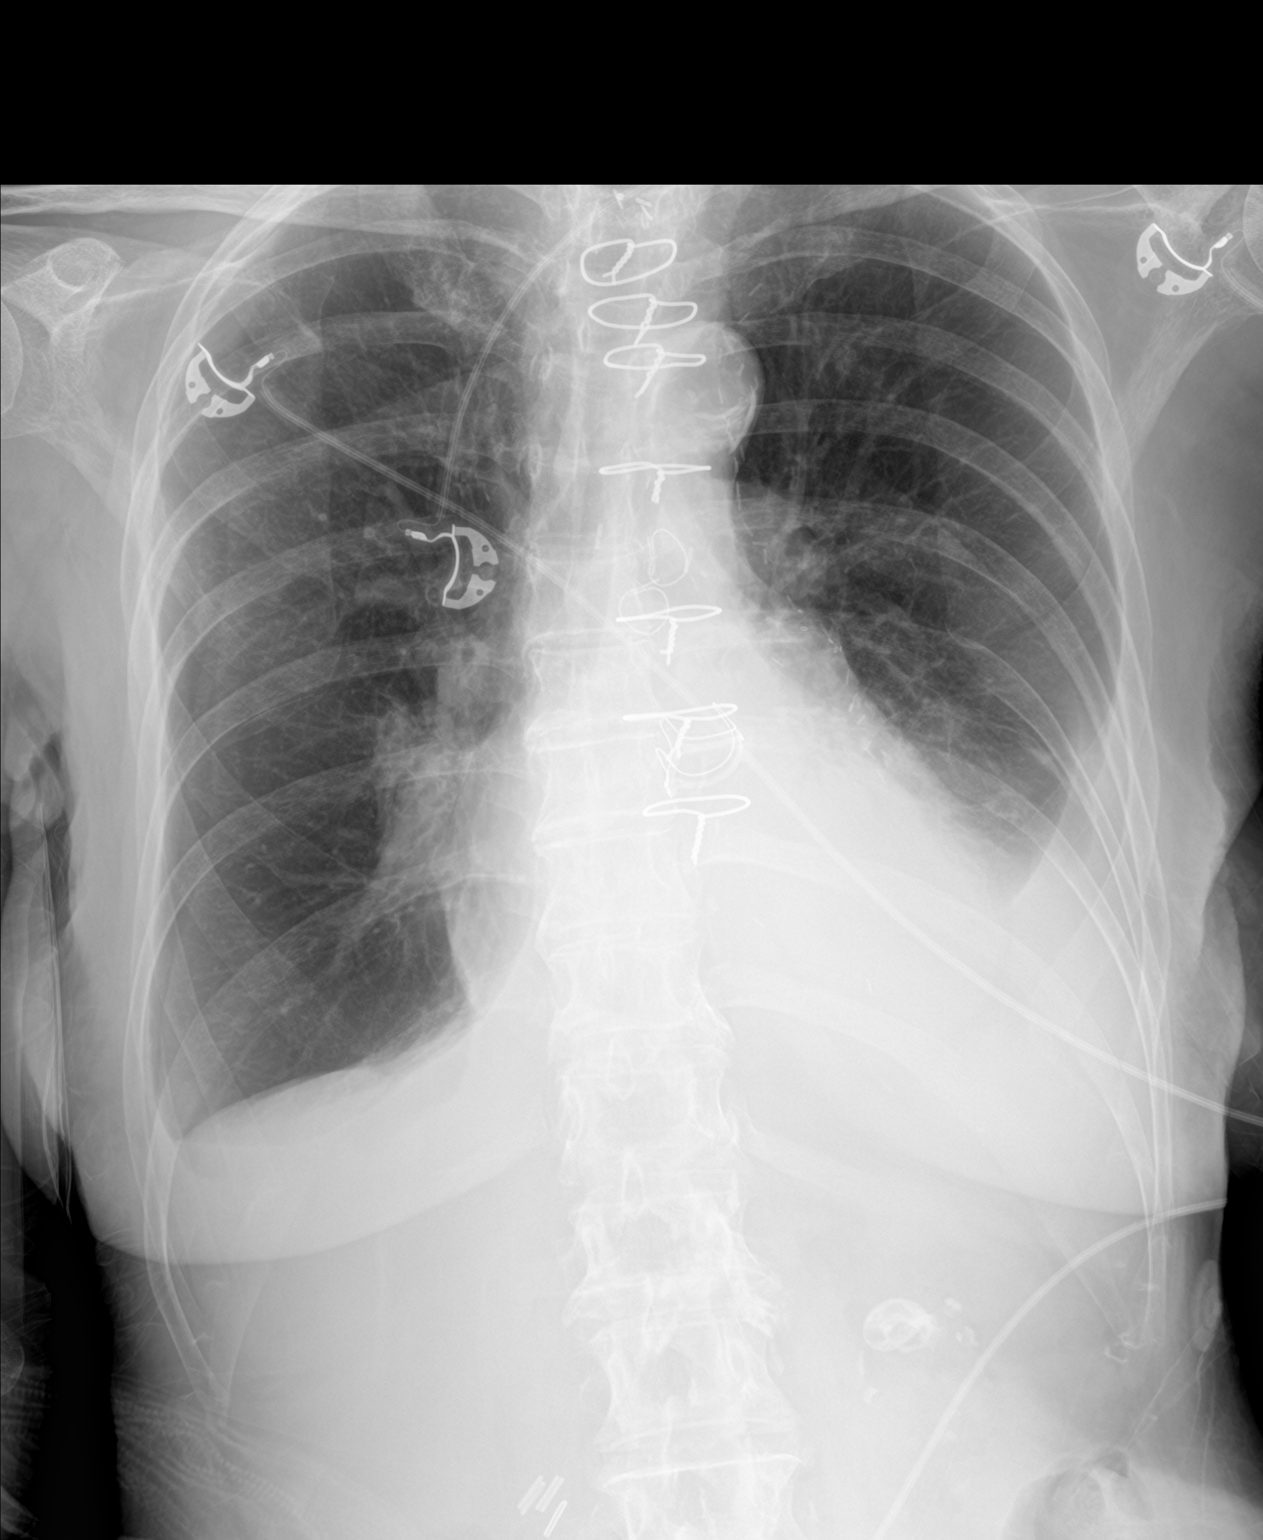

[1 of 1 positions shown; findings below may reference images not displayed]

FINDINGS: Median sternotomy wires, mediastinal surgical clips, and aortic
valve prosthesis are stable.

The right pleural effusion has significantly decreased in size
following thoracentesis. Is no pneumothorax. There is minimal right
basilar atelectasis. The left pleural effusion is not significantly
changed in size, with persistent left basilar opacities. The left
upper lobe remains well-aerated. There is no appreciable left
pneumothorax.

The bones are stable.
IMPRESSION: 1. Decreased size of the right pleural effusion following
thoracentesis. No pneumothorax.
2. Stable left pleural effusion with adjacent left basilar opacity.

## 2021-03-31 IMAGING — US US THORACENTESIS ASP PLEURAL SPACE W/IMG GUIDE
1 series · 4 of 4 positions shown · non-contrast
Comparison: none

INDICATION: Patient with history of coronary artery disease with prior CABG,
CLL, atrial fibrillation, TIA, bilateral pleural effusions. Request
received for diagnostic and therapeutic right thoracentesis.

[Series 1: us thoracentesis asp pleural space w/img guide · 4 of 4 slices shown]
[im 1/4]
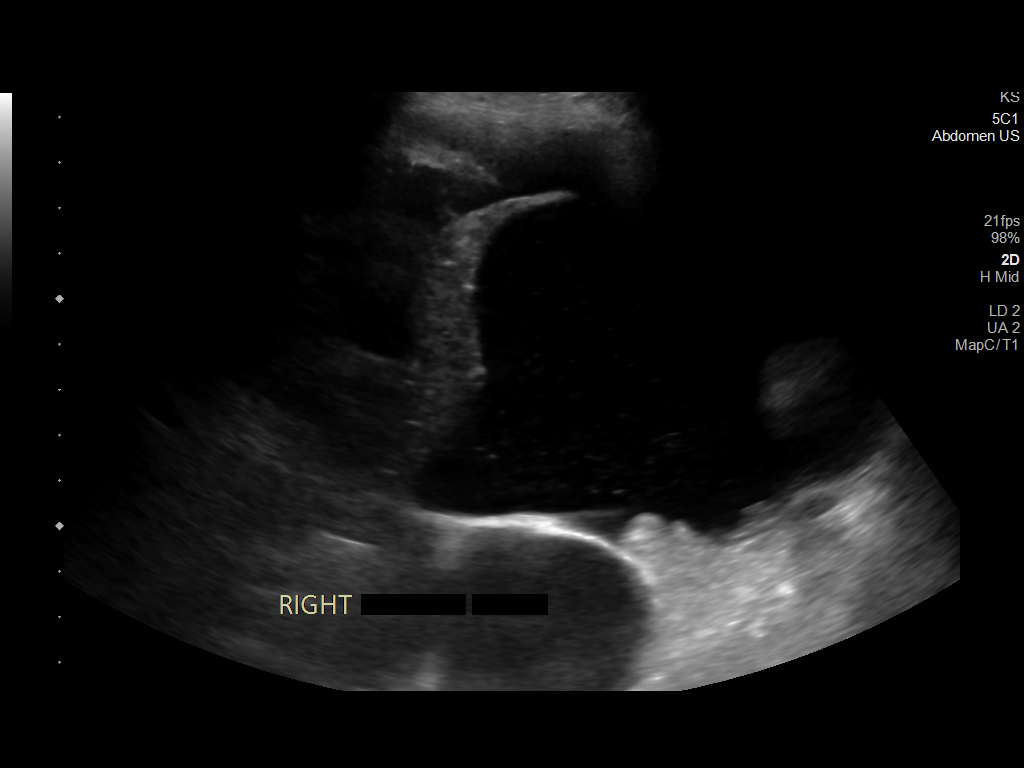
[im 2/4]
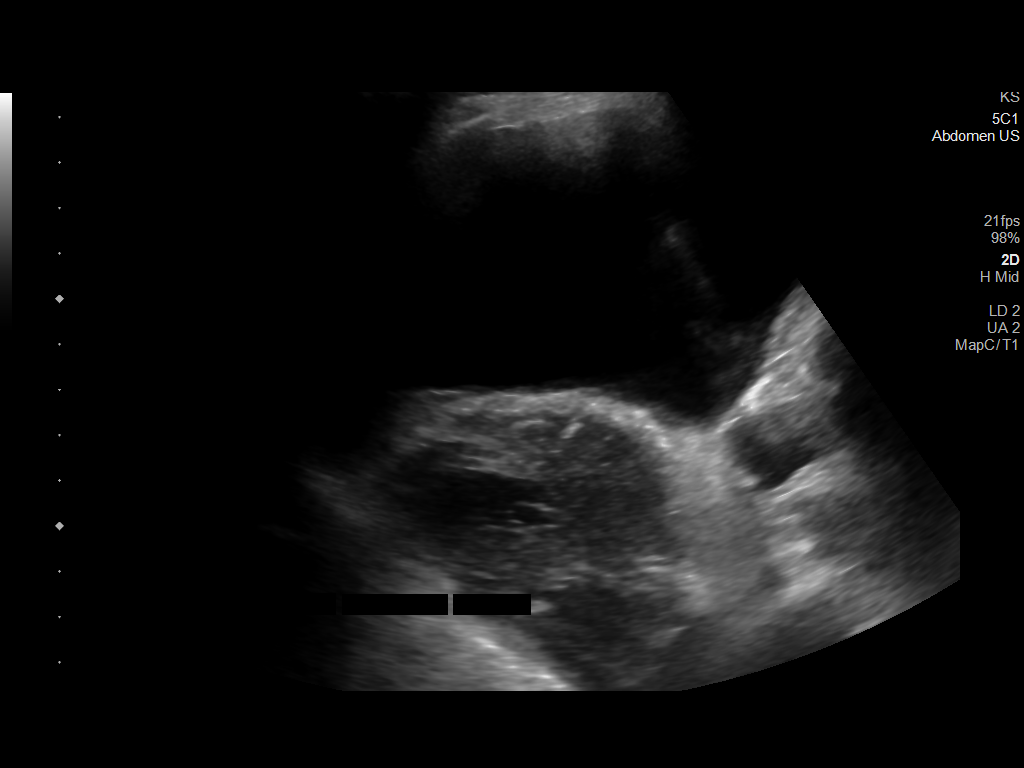
[im 3/4]
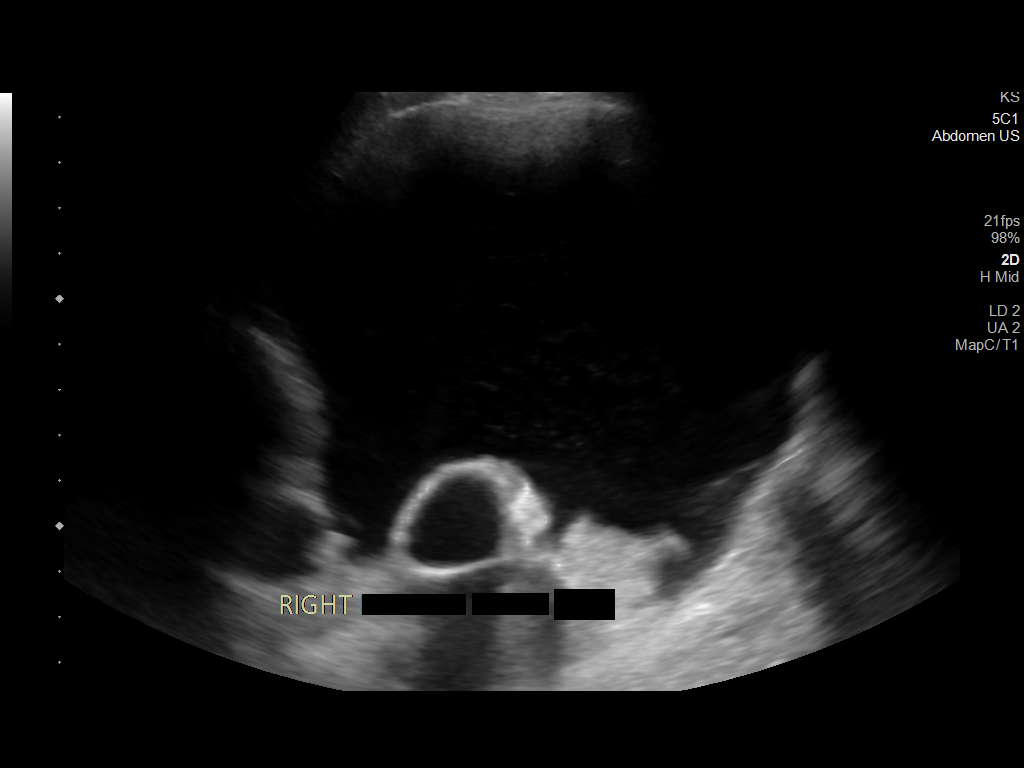
[im 4/4]
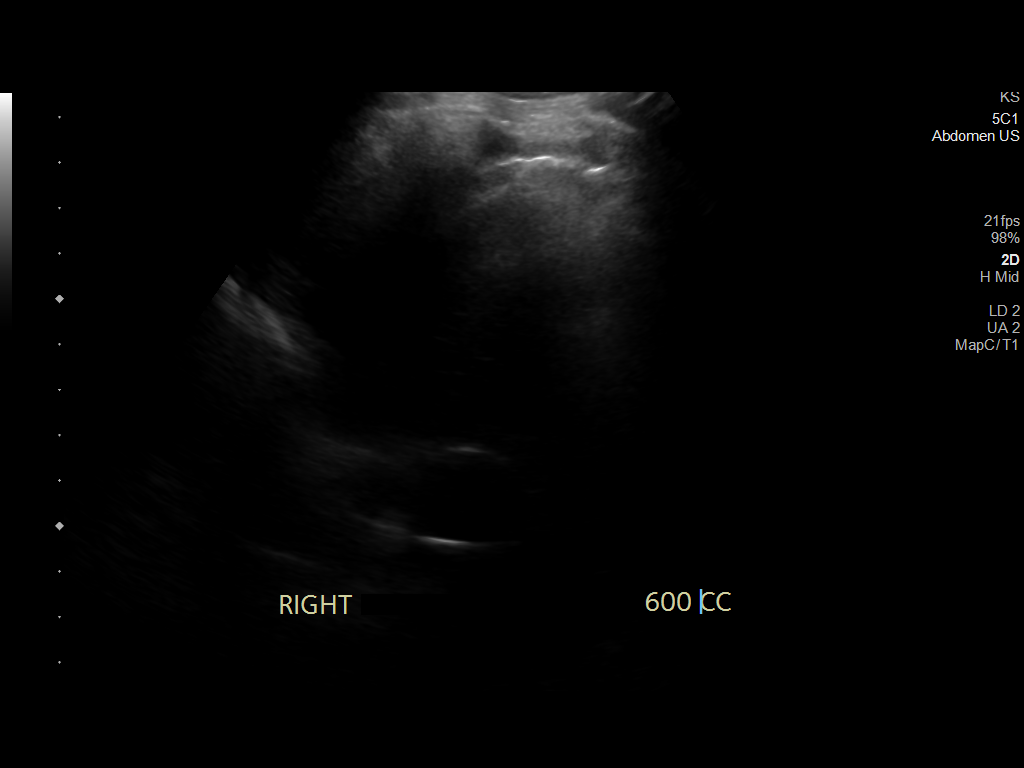

[4 of 4 positions shown; findings below may reference images not displayed]

EXAM:
ULTRASOUND GUIDED DIAGNOSTIC AND THERAPEUTIC RIGHT THORACENTESIS

MEDICATIONS:
10 mL 1% lidocaine

COMPLICATIONS:
None immediate.

PROCEDURE:
An ultrasound guided thoracentesis was thoroughly discussed with the
patient/son and questions answered. The benefits, risks,
alternatives and complications were also discussed. The patient
understands and wishes to proceed with the procedure. Written
consent was obtained.

Ultrasound was performed to localize and mark an adequate pocket of
fluid in the right chest. The area was then prepped and draped in
the normal sterile fashion. 1% Lidocaine was used for local
anesthesia. Under ultrasound guidance a 6 Fr Safe-T-Centesis
catheter was introduced. Thoracentesis was performed. The catheter
was removed and a dressing applied.
FINDINGS: A total of approximately 600 cc of yellow fluid was removed. Samples
were sent to the laboratory as requested by the clinical team.
IMPRESSION: Successful ultrasound guided diagnostic and therapeutic right
thoracentesis yielding 600 cc of pleural fluid.

## 2021-03-31 MED ORDER — LIDOCAINE HCL (PF) 1 % IJ SOLN
INTRAMUSCULAR | Status: DC
Start: 2021-03-31 — End: 2021-03-31
  Filled 2021-03-31: qty 30

## 2021-03-31 MED ORDER — LIDOCAINE HCL (PF) 1 % IJ SOLN
INTRAMUSCULAR | Status: AC
  Filled 2021-03-31: qty 30

## 2021-03-31 NOTE — Progress Notes (Addendum)
IR staff Larene [place] Pa given patients sons contact info. Ultrasound guided thoracentesis procedure plan for today ?

## 2021-03-31 NOTE — Progress Notes (Signed)
Patient back into room 3w13 from ultrasound guided thoracentesis procedure. Vss stable Chest xray done right after procedure. Patient denies any pain or sob. Thoracentesis site unremarkable. ?

## 2021-03-31 NOTE — Care Management Obs Status (Signed)
MEDICARE OBSERVATION STATUS NOTIFICATION ? ? ?Patient Details  ?Name: Nicole Mercado ?MRN: [id number] ?Date of Birth: 12-Jul-1940 ? ? ?Medicare Observation Status Notification Given:  Yes ? ? ? ?Carles Collet, RN ?03/31/2021, 3:21 PM ?

## 2021-03-31 NOTE — Procedures (Signed)
Ultrasound-guided diagnostic and therapeutic right thoracentesis performed yielding 600 cc of yellow fluid. No immediate complications. Follow-up chest x-ray pending.The fluid was sent to the lab for preordered studies. EBL < 1 cc.     ? ?

## 2021-03-31 NOTE — Progress Notes (Addendum)
STROKE TEAM PROGRESS NOTE  ? ?INTERVAL HISTORY ?No family at bedside.  ?Her husband is at the bedside.  Felt like her heart was racing when she fell. ?On afib, cardioversion on April 17th. No concerns with her Eliquis, taking it twice per day.  ?No numbness weakness or tingling. Disoriented to time, possible cognitive impairment. ? ?Patient had no further concerns. ? ?Vitals:  ? 03/31/21 0100 03/31/21 0200 03/31/21 0412 03/31/21 0717  ?BP: 125/70 130/67 130/63 128/67  ?Pulse: 62 72 88 72  ?Resp: 10  17   ?Temp:  97.6 ?F (36.4 ?C) 97.6 ?F (36.4 ?C) 97.6 ?F (36.4 ?C)  ?TempSrc:  Oral  Oral  ?SpO2: 97% 100% 100% 98%  ?Weight:  44.2 kg    ?Height:  5' (1.524 m)    ? ?CBC:  ?Recent Labs  ?Lab 03/30/21 ?1841 03/30/21 ?2330 03/31/21 ?9371  ?WBC 12.8* 9.7 10.6*  ?NEUTROABS 7.6  --  8.4*  ?HGB 13.7 13.4 12.9  ?HCT 40.1 39.0 37.6  ?MCV 98.5 99.2 95.9  ?PLT 135* 125* PLATELET CLUMPS NOTED ON SMEAR, UNABLE TO ESTIMATE  ? ?Basic Metabolic Panel:  ?Recent Labs  ?Lab 03/30/21 ?2330 03/31/21 ?6967  ?NA 122* 128*  ?K 4.3 3.5  ?CL 87* 89*  ?CO2 24 27  ?GLUCOSE 112* 88  ?BUN 22 17  ?CREATININE 1.00 1.04*  ?CALCIUM 9.7 9.3  ? ?Lipid Panel:  ?Recent Labs  ?Lab 03/30/21 ?2331  ?CHOL 96  ?TRIG 133  ?HDL 36*  ?CHOLHDL 2.7  ?VLDL 27  ?[place] 33  ? ?HgbA1c:  ?Recent Labs  ?Lab 03/30/21 ?2331  ?HGBA1C 5.3  ? ?Urine Drug Screen:  ?Recent Labs  ?Lab 03/30/21 ?2044  ?LABOPIA NONE DETECTED  ?COCAINSCRNUR NONE DETECTED  ?LABBENZ NONE DETECTED  ?AMPHETMU NONE DETECTED  ?THCU NONE DETECTED  ?LABBARB NONE DETECTED  ? ?Alcohol Level  ?Recent Labs  ?Lab 03/30/21 ?2045  ?ETH <10  ? ? ?IMAGING past 24 hours ?MR BRAIN WO CONTRAST ? ?Result Date: 03/31/2021 ?CLINICAL DATA:  Speech slurring EXAM: MRI HEAD WITHOUT CONTRAST TECHNIQUE: Multiplanar, multiecho pulse sequences of the brain and surrounding structures were obtained without intravenous contrast. COMPARISON:  Head CT and CTA from yesterday FINDINGS: Brain: No acute infarction, hemorrhage,  hydrocephalus, extra-axial collection or mass lesion. Small scattered remote cerebellar infarcts, mainly right-sided. Chronic lacunar infarct at the right putamen. Ischemic gliosis in the deep white matter. Mild for age and generalized cerebral volume loss. Vascular: Normal flow voids. Strong left vertebral artery dominance. Skull and upper cervical spine: Normal marrow signal. Sinuses/Orbits: Bilateral cataract resection. Minor right frontal sinus opacification IMPRESSION: 1. No acute finding. 2. Moderate chronic small vessel ischemia. Small remote cerebellar infarcts. Electronically Signed   By: Jorje Guild M.D.   On: 03/31/2021 05:26  ? ?DG Chest Portable 1 View ? ?Result Date: 03/30/2021 ?CLINICAL DATA:  Altered mental status EXAM: PORTABLE CHEST 1 VIEW COMPARISON:  03/19/2021 FINDINGS: Check shadow is enlarged but stable. Postsurgical changes are again noted. Aortic calcifications are seen. New bilateral pleural effusions are noted with underlying atelectatic changes. No acute bony abnormality is noted. IMPRESSION: New bilateral effusions and atelectatic changes. Electronically Signed   By: Inez Catalina M.D.   On: 03/30/2021 19:28  ? ?CT HEAD CODE STROKE WO CONTRAST ? ?Result Date: 03/30/2021 ?CLINICAL DATA:  Code stroke. Neuro deficit, stroke suspected. Slurred speech EXAM: CT HEAD WITHOUT CONTRAST TECHNIQUE: Contiguous axial images were obtained from the base of the skull through the vertex without intravenous contrast. RADIATION DOSE REDUCTION: This  exam was performed according to the departmental dose-optimization program which includes automated exposure control, adjustment of the mA and/or kV according to patient size and/or use of iterative reconstruction technique. COMPARISON:  CT head 08/18/2020 FINDINGS: Brain: There is no evidence of acute intracranial hemorrhage, extra-axial fluid collection, or acute infarct. There is mild global parenchymal volume loss with prominence of the ventricular system  and extra-axial CSF spaces. Confluent hypodensity in the subcortical and periventricular white matter likely reflects sequela of advanced chronic white matter microangiopathy. There are small remote lacunar infarcts in the right basal ganglia and left insula. There is no mass lesion.  There is no mass effect or midline shift. Vascular: There is calcification of the bilateral cavernous ICAs. Skull: Normal. Negative for fracture or focal lesion. Sinuses/Orbits: The imaged paranasal sinuses are clear. Bilateral lens implants are in place. The globes and orbits are otherwise unremarkable. Other: None ASPECTS ([place] Stroke Program Early CT Score) - Ganglionic level infarction (caudate, lentiform nuclei, internal capsule, insula, M1-M3 cortex): 7 - Supraganglionic infarction (M4-M6 cortex): 3 Total score (0-10 with 10 being normal): 10 IMPRESSION: 1. No acute intracranial hemorrhage or infarct. 2. ASPECTS is 10 3. Mild global parenchymal volume loss and moderate chronic white matter microangiopathy. Small remote lacunar infarcts in the right basal ganglia and left insula. These results were called by telephone at the time of interpretation on 03/30/2021 at 6:48 pm to provider ADAM CURATOLO , who verbally acknowledged these results. Electronically Signed   By: Valetta Mole M.D.   On: 03/30/2021 18:47  ? ?CT ANGIO HEAD NECK W WO CM (CODE STROKE) ? ?Result Date: 03/30/2021 ?CLINICAL DATA:  Slurred speech, stroke suspected EXAM: CT ANGIOGRAPHY HEAD AND NECK TECHNIQUE: Multidetector CT imaging of the head and neck was performed using the standard protocol during bolus administration of intravenous contrast. Multiplanar CT image reconstructions and MIPs were obtained to evaluate the vascular anatomy. Carotid stenosis measurements (when applicable) are obtained utilizing NASCET criteria, using the distal internal carotid diameter as the denominator. RADIATION DOSE REDUCTION: This exam was performed according to the departmental  dose-optimization program which includes automated exposure control, adjustment of the mA and/or kV according to patient size and/or use of iterative reconstruction technique. CONTRAST:  55m OMNIPAQUE IOHEXOL 350 MG/ML SOLN COMPARISON:  Same-day noncontrast CT head, CT cervical spine 08/18/2020, CT neck 11/05/2010 CTA chest 03/19/2021 FINDINGS: CTA NECK FINDINGS Aortic arch: There is calcified atherosclerotic plaque of the aortic arch. The origins of the major branch vessels are patent. The subclavian arteries are patent to the level imaged. Right carotid system: The right common carotid artery is patent. There is mixed plaque in the proximal right internal carotid artery resulting in less than 50% stenosis. The distal right internal carotid artery is patent, mildly tortuous in the upper neck. There is severe stenosis at the origin of the external carotid artery with diminutive proximal branches. Left carotid system: Surgical clips adjacent to the carotid bifurcation are consistent with the history of prior endarterectomy. The left common, internal, and external carotid arteries are patent, without hemodynamically significant stenosis or occlusion. There is mild plaque in the distal left common carotid artery and distal internal carotid artery. The distal internal carotid artery is mildly tortuous. Vertebral arteries: The left vertebral artery is dominant, a normal variant. There is scattered plaque in the left vertebral artery resulting in mild stenosis at the origin and mild stenosis in the V2 segment. Otherwise, the vertebral arteries are patent, without hemodynamically significant stenosis or occlusion. There is  no dissection or aneurysm. Skeleton: There is grade 1 anterolisthesis of C3 on C4. There is mild multilevel degenerative change of the cervical spine. There is no acute osseous abnormality or aggressive osseous lesion. There is degenerative change of the bilateral temporomandibular joints. Median  sternotomy wires are noted. There is no visible canal hematoma. Other neck: The soft tissues are unremarkable. Upper chest: There are large bilateral pleural effusions, new since the CTA chest from 03/19/2021. Review o

## 2021-03-31 NOTE — Progress Notes (Signed)
Central tele notified patients cardizem gtt titrated off. [facility]cmd staff aware of parameters and to call bedside RN as needed. ?

## 2021-03-31 NOTE — ED Notes (Signed)
[facility] called requesting transport for pt to [facility] 3W13 ?

## 2021-03-31 NOTE — Evaluation (Signed)
Physical Therapy Evaluation ?Patient Details ?Name: Nicole Mercado ?MRN: [id number] ?DOB: May 01, 1940 ?Today's Date: 03/31/2021 ? ?History of Present Illness ? 81 y.o. femalewas brought to the ER 03/30/21 after patient's friend noticed that patient had difficulty to speak and also her speech was not making sense. In ER pt initially had some difficulty speaking but gradually became back to baseline. CT head and CT angiogram with no acute findings Pt found to be in A-fib with RVR. BNP was elevated at 900 chest x-ray shows bilateral pleural effusion which is new. Admitted overnight for obsevarion.  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 PTA pt living alone in single level condo with level entry. Pt reports independence with limited community ambulation, ADLs and iADLs. Pt is currently limited in safe mobility by decreased cognition, decreased safety awareness especially in terms of falls and blood thinner for A-fib, decreased sequencing and command follow, and decreased short term memory, in presence of decreased strength, and balance. Pt is mod I for bed mobility, min A for steadying with standing and min A for ambulation to bathroom for 2x LoB. PT recommending HHPT and RW at discharge to improve balance and reduce risk for further falls. PT will continue to follow acutely.    ?   ? ?Recommendations for follow up therapy are one component of a multi-disciplinary discharge planning process, led by the attending physician.  Recommendations may be updated based on patient status, additional functional criteria and insurance authorization. ? ?Follow Up Recommendations Home health PT ? ?  ?Assistance Recommended at Discharge Intermittent Supervision/Assistance  ?Patient can return home with the following ? A little help with walking  and/or transfers;A little help with bathing/dressing/bathroom;Direct supervision/assist for medications management;Direct supervision/assist for financial management;Assist for transportation ? ?  ?Equipment Recommendations Rolling walker (2 wheels)  ?   ?Functional Status Assessment Patient has had a recent decline in their functional status and demonstrates the ability to make significant improvements in function in a reasonable and predictable amount of time.  ? ?  ?Precautions / Restrictions Precautions ?Precautions: Fall ?Precaution Comments: hx of falling ?Restrictions ?Weight Bearing Restrictions: No  ? ?  ? ?Mobility ? Bed Mobility ?Overal bed mobility: Modified Independent ?  ?  ?  ?  ?  ?  ?General bed mobility comments: increased time and effort required to get to EoB ?  ? ?Transfers ?Overall transfer level: Needs assistance ?  ?Transfers: Sit to/from Stand ?Sit to Stand: Min assist ?  ?  ?  ?  ?  ?General transfer comment: good power up, min A for steadying once in standing from bed and from toilet ?  ? ?Ambulation/Gait ?Ambulation/Gait assistance: Min guard, Min assist ?Gait Distance (Feet): 25 Feet ?Assistive device: None ?Gait Pattern/deviations: Step-through pattern, Decreased step length - right, Decreased step length - left, Shuffle, Staggering left, Staggering right ?Gait velocity: slowed ?Gait velocity interpretation: <1.31 ft/sec, indicative of household ambulator ?  ?General Gait Details: min guard for safety, 2x LoB requiring min A for steadying especially with turning in bathroom ? ? ?  ? ?Balance Overall balance assessment: Needs assistance ?Sitting-balance support: No upper extremity supported, Feet supported, Feet unsupported ?Sitting balance-Leahy Scale: Good ?  ?  ?Standing balance support: No upper extremity supported, Single extremity supported, Bilateral upper extremity supported, During functional activity ?Standing  balance-Leahy Scale: Fair ?Standing balance comment: does better  with UE support during dynamic activity ?  ?  ?  ?  ?  ?  ?  ?  ?  ?  ?  ?   ? ? ? ?Pertinent Vitals/Pain Pain Assessment ?Pain Assessment: No/denies pain  ? ? ?Home Living Family/patient expects to be discharged to:: Private residence ?Living Arrangements: Alone ?Available Help at Discharge: Friend(s);Available PRN/intermittently ?Type of Home: Apartment ?Home Access: Level entry ?  ?  ?  ?Home Layout: One level ?Home Equipment: None ?   ?  ?Prior Function Prior Level of Function : Independent/Modified Independent ?  ?  ?  ?  ?  ?  ?Mobility Comments: reports independence with ambulation without AD, however also endorses hx of falling as recently as last week ?ADLs Comments: reports independence in ADLs and iADLs ?  ? ? ?   ?Extremity/Trunk Assessment  ? Upper Extremity Assessment ?Upper Extremity Assessment: Defer to OT evaluation ?  ? ?Lower Extremity Assessment ?Lower Extremity Assessment: RLE deficits/detail;LLE deficits/detail ?RLE Deficits / Details: ROM WFL, when able to follow command for MMT has 3+/5 strength globally, however difficulty in sequencing movement initially ?RLE Sensation: WNL ?RLE Coordination: WNL ?LLE Deficits / Details: ROM WFL, when able to follow command for MMT has 3+/5 strength globally, however difficulty in sequencing movement initially ?LLE Sensation: WNL ?LLE Coordination: WNL ?  ? ?Cervical / Trunk Assessment ?Cervical / Trunk Assessment: Normal  ?Communication  ? Communication: No difficulties  ?Cognition Arousal/Alertness: Awake/alert ?Behavior During Therapy: Flat affect ?Overall Cognitive Status: Impaired/Different from baseline ?Area of Impairment: Memory, Following commands, Safety/judgement, Awareness, Problem solving ?  ?  ?  ?  ?  ?  ?  ?  ?  ?  ?Memory: Decreased short-term memory (unable to recall 3 words from begining of session to end) ?Following Commands: Follows multi-step commands with increased time ?Safety/Judgement: Decreased awareness of deficits, Decreased  awareness of safety ?Awareness: Emergent ?Problem Solving: Requires verbal cues, Requires tactile cues, Difficulty sequencing, Slow processing ?General Comments: pt requires increased multimodal cuing for task completion, cannont remember from beginning of session to end of session, decreased awareness of needs for safety for eliminating falls in presence of blood thinners for A-fib ?  ?  ? ?  ?General Comments General comments (skin integrity, edema, etc.): Pt on 2L O2 via [place] on entry, SpO2 95% O2, removed and pt able to maintain SpO2 >91%O2 on RA ? ?  ? ?Assessment/Plan  ?  ?PT Assessment Patient needs continued PT services  ?PT Problem List Decreased balance;Decreased mobility;Decreased cognition;Decreased safety awareness;Decreased knowledge of use of DME ? ?   ?  ?PT Treatment Interventions DME instruction;Gait training;Functional mobility training;Therapeutic activities;Therapeutic exercise;Balance training;Cognitive remediation;Patient/family education   ? ?PT Goals (Current goals can be found in the Care Plan section)  ?Acute Rehab PT Goals ?Patient Stated Goal: go home ?PT Goal Formulation: With patient ?Time For Goal Achievement: 04/14/21 ?Potential to Achieve Goals: Fair ? ?  ?Frequency Min 3X/week ?  ? ? ?   ?AM-PAC PT "6 Clicks" Mobility  ?Outcome Measure Help needed turning from your back to your side while in a flat bed without using bedrails?: None ?Help needed moving from lying on your back to sitting on the side of a flat bed without using bedrails?: None ?Help needed moving to and from a bed to a chair (including a wheelchair)?: A Little ?Help needed standing up from a chair using your arms (e.g., wheelchair or bedside  chair)?: A Little ?Help needed to walk in hospital room?: A Little ?Help needed climbing 3-5 steps with a railing? : A Lot ?6 Click Score: 19 ? ?  ?End of Session Equipment Utilized During Treatment: Gait belt ?Activity Tolerance: Patient tolerated treatment well ?Patient left: in  chair;with call bell/phone within reach;with family/visitor present;with chair alarm set ?Nurse Communication: Mobility status ?PT Visit Diagnosis: Unsteadiness on feet (R26.81);Repeated falls (R29.6);Muscle w

## 2021-03-31 NOTE — Evaluation (Signed)
Occupational Therapy Evaluation ?Patient Details ?Name: Nicole Mercado ?MRN: [id number] ?DOB: 1940-11-16 ?Today's Date: 03/31/2021 ? ? ?History of Present Illness 81 y.o. femalewas brought to the ER 03/30/21 after patient's friend noticed that patient had difficulty to speak and also her speech was not making sense. In ER pt initially had some difficulty speaking but gradually became back to baseline. CT head and CT angiogram with no acute findings Pt found to be in A-fib with RVR. BNP was elevated at 900 chest x-ray shows bilateral pleural effusion which is new. Admitted overnight for obsevarion.  PMH: CAD status post CABG and bioprosthetic aortic valve replacement, history of CLL who was recently admitted and discharged about 10 days ago for A-fib with RVR at the time patient also had a syncopal episode  ? ?Clinical Impression ?  ?Nyelah was evaluated s/p the above admission list, she reports being indep PTA including all ADL/IADLs and driving, but also admits to having multiple falls. She lives in a level entry [place], with a "close friend" who can assist as needed at d/c. Upon evaluation pt required setup/supervision for all sitting ADLs and up to min A for standing ADLs and functional mobility with RW. Pt noted to have cognitive impairments and benefited from cues for sequencing and problem solving. Short Blessed Test completed with a score of 12/28 suggesting impairment consistent with dementia. Pt will benefit from OT acutely. Recommend d/c home with [place] [facility] and assist for higher level ADLs. ?   ? ?Recommendations for follow up therapy are one component of a multi-disciplinary discharge planning process, led by the attending physician.  Recommendations may be updated based on patient status, additional functional criteria and insurance authorization.  ? ?Follow Up Recommendations ? Home health OT  ?  ?Assistance Recommended at Discharge Frequent or constant Supervision/Assistance  ?Patient can return home with the  following A little help with walking and/or transfers;A little help with bathing/dressing/bathroom;Assistance with cooking/housework;Assist for transportation;Help with stairs or ramp for entrance;Direct supervision/assist for medications management ? ?  ?Functional Status Assessment ? Patient has had a recent decline in their functional status and demonstrates the ability to make significant improvements in function in a reasonable and predictable amount of time.  ?Equipment Recommendations ? Other (comment) (RW)  ?  ?   ?Precautions / Restrictions Precautions ?Precautions: Fall ?Precaution Comments: hx of falling ?Restrictions ?Weight Bearing Restrictions: No  ? ?  ? ?Mobility Bed Mobility ?  ?  ?  ?  ?  ?  ?  ?General bed mobility comments: iin chair upon arrival ?  ? ?Transfers ?Overall transfer level: Needs assistance ?Equipment used: Rolling walker (2 wheels) ?Transfers: Sit to/from Stand ?Sit to Stand: Min assist ?  ?  ?  ?  ?  ?  ?  ? ?  ?Balance Overall balance assessment: Needs assistance ?Sitting-balance support: No upper extremity supported, Feet supported, Feet unsupported ?Sitting balance-Leahy Scale: Good ?  ?  ?Standing balance support: No upper extremity supported, Single extremity supported, Bilateral upper extremity supported, During functional activity ?Standing balance-Leahy Scale: Fair ?Standing balance comment: does better with UE support during dynamic activity ?  ?  ?  ?  ?  ?  ?  ?  ?  ?  ?  ?   ? ?ADL either performed or assessed with clinical judgement  ? ?ADL Overall ADL's : Needs assistance/impaired ?Eating/Feeding: Independent;Sitting ?  ?Grooming: Supervision/safety;Sitting ?  ?Upper Body Bathing: Supervision/ safety;Sitting ?  ?Lower Body Bathing: Min guard;Sit to/from stand ?  ?  Upper Body Dressing : Set up;Sitting ?  ?Lower Body Dressing: Min guard;Sit to/from stand ?  ?Toilet Transfer: Minimal assistance;Rolling walker (2 wheels);Ambulation ?Toilet Transfer Details (indicate cue  type and reason): minor LOB ?Toileting- Clothing Manipulation and Hygiene: Min guard;Sitting/lateral lean ?  ?  ?  ?Functional mobility during ADLs: Minimal assistance;Rolling walker (2 wheels) ?General ADL Comments: pt required cues throughout for safety and problem solving. min A for functional ambulation due to LOB  ? ? ? ?Vision Baseline Vision/History: 0 No visual deficits ?Ability to See in Adequate Light: 0 Adequate ?Vision Assessment?: No apparent visual deficits  ?   ?   ?   ? ?Pertinent Vitals/Pain Pain Assessment ?Pain Assessment: No/denies pain  ? ? ? ?Hand Dominance Right ?  ?Extremity/Trunk Assessment Upper Extremity Assessment ?Upper Extremity Assessment: Generalized weakness;Overall [facility] for tasks assessed (difficult to process multi-step directions for MMT, required increased time and cues) ?  ?Lower Extremity Assessment ?Lower Extremity Assessment: Defer to PT evaluation ?RLE Deficits / Details: ROM WFL, when able to follow command for MMT has 3+/5 strength globally, however difficulty in sequencing movement initially ?RLE Sensation: WNL ?RLE Coordination: WNL ?LLE Deficits / Details: ROM WFL, when able to follow command for MMT has 3+/5 strength globally, however difficulty in sequencing movement initially ?LLE Sensation: WNL ?LLE Coordination: WNL ?  ?Cervical / Trunk Assessment ?Cervical / Trunk Assessment: Normal ?  ?Communication Communication ?Communication: No difficulties ?  ?Cognition Arousal/Alertness: Awake/alert ?Behavior During Therapy: Flat affect ?Overall Cognitive Status: Impaired/Different from baseline ?Area of Impairment: Memory, Following commands, Safety/judgement, Awareness, Problem solving ?  ?  ?  ?  ?  ?  ?  ?  ?  ?  ?Memory: Decreased short-term memory ?Following Commands: Follows one step commands consistently ?Safety/Judgement: Decreased awareness of deficits, Decreased awareness of safety ?Awareness: Emergent ?Problem Solving: Requires verbal cues, Requires tactile cues,  Difficulty sequencing, Slow processing ?General Comments: Pt completed Short blessed assessment this date with score of 12/28 which suggests  an impairment consistent with dementia. Pt had difficulties in the areas of delayed recall and counting the months backward. Pt requires cues for safety throughout, and demonstrates limited insight into deficits/safety ?  ?  ?General Comments  VSS on RA ? ?  ?   ?   ? ? ?Home Living Family/patient expects to be discharged to:: Private residence ?Living Arrangements: Alone ?Available Help at Discharge: Friend(s);Available PRN/intermittently ?Type of Home: Apartment ?Home Access: Level entry ?  ?  ?Home Layout: One level ?  ?  ?Bathroom Shower/Tub: Tub/shower unit ?  ?Bathroom Toilet: Standard ?Bathroom Accessibility: Yes ?  ?Home Equipment: None ?  ?Additional Comments: "very close friend" able to assist "some" ?  ? ?  ?Prior Functioning/Environment Prior Level of Function : Independent/Modified Independent ?  ?  ?  ?  ?  ?  ?Mobility Comments: reports independence with ambulation without AD, however also endorses hx of falling as recently as last week ?ADLs Comments: reports independence in ADLs and iADLs, including driving, med mgmt and money mgm. ?  ? ?  ?  ?OT Problem List: Decreased strength;Decreased range of motion;Decreased activity tolerance;Impaired balance (sitting and/or standing);Decreased cognition;Decreased safety awareness ?  ?   ?OT Treatment/Interventions: Self-care/ADL training;Therapeutic exercise;DME and/or AE instruction;Therapeutic activities;Cognitive remediation/compensation  ?  ?OT Goals(Current goals can be found in the care plan section) Acute Rehab OT Goals ?Patient Stated Goal: home ?OT Goal Formulation: With patient ?Time For Goal Achievement: 04/14/21 ?Potential to Achieve Goals: Good ?ADL Goals ?Pt  Will Perform Grooming: Independently;standing ?Pt Will Transfer to Toilet: with modified independence;ambulating ?Pt Will Perform Toileting -  Clothing Manipulation and hygiene: Independently;sitting/lateral leans ?Additional ADL Goal #1: Pt will indep carryout a 3 step directional task to improve safety durign ADLs ?Additional ADL Goal #2: pt will dem

## 2021-03-31 NOTE — Progress Notes (Signed)
? ? ? [facility] ?                                                                            ? ? ?Nicole Mercado, is a 81 y.o. female, DOB - 02/07/1940, LKG:[id number] ?Admit date - 03/30/2021    ?Outpatient Primary MD for the patient is Kristie Cowman, MD ? ?LOS - 0  days ? ? ? ?Brief summary  ? ?Nicole Mercado is a 81 y.o. 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 ? ?CT head did not show anything acute and this was followed by CT angiogram of the head and neck which did not show any large vessel obstruction.  Patient also was in A-fib with RVR was started on Cardizem infusion. ? ? ?Assessment & Plan  ? ? ?Assessment and Plan: ? ?Difficulty speech/syncope: ?So far work-up has been negative for an acute stroke. Possibly from UTI.  ?Continue with aspirin. ?Hemoglobin A1c is 5.3% ?Ldl IS 33.  ? ? ?Atrial fibrillation with RVR on cardizem. ?Plan to wean her off cardizem.  ?Continue with metoprolol 100 mg BID.  ?Echocardiogram showed  Left ventricular ejection fraction is 60 to 65%. The left ventricle has normal function. The left ventricle has no regional wall motion abnormalities. There is moderate left ventricular hypertrophy. Left ventricular diastolic parameters are indeterminate. ?ELIQUIS is on hold until neurology clears to resume it.  ? ? ? ?CAD s/p CABG; ?No chest pain or sob.  ? ? ?H/o CLL;  ?Outpatient follow up with PCP.  ? ? ? ?UTI ?urine cultures are pending. Currently on rocephin.  ? ? ?Hyponatremia:  ?Possibly from fluid overload. Improving.  ?Urine osmo is 265 and urine sodium is 91. ? ? ?Elevated liver enzymes possibly from congestion from CHF.  ?US abdomen shows fatty liver disease.   ?Hepatitis panel is wnl.  ? ? ?Chronic diastolic heart failure : ?Elevated BNP.  ? ? ?Large bilateral pleural effusions:  ?IR consulted for thoracentesis and fluid to be sent for analysis. ? ?  ? ?Estimated body mass index is 19.03 kg/m? as calculated from the following: ?  Height as of this encounter: 5' (1.524 m). ?  Weight as of this encounter: 44.2 kg. ? ?Code Status: full code.  ?DVT Prophylaxis:  enoxaparin (LOVENOX) injection 30 mg Start: 03/31/21 1000 ? ? ?Level of Care: Level of care: Progressive ?Family Communication: none at bedside.  ? ?Disposition Plan:     Remains inpatient appropriate:  further work up for hyponatremia and Atrial fib.   ? ?Procedures:  ?None.  ? ?Consultants:   ?Neurology.  ? ?Antimicrobials:  ? ?Anti-infectives (From admission, onward)  ? ? Start     Dose/Rate Route Frequency Ordered Stop  ? 03/30/21 2245  cefTRIAXone (ROCEPHIN) 1 g in sodium chloride 0.9 % 100 mL IVPB       ? 1 g ?200 mL/hr over 30  Minutes Intravenous Every 24 hours 03/30/21 2230    ? ?  ? ? ? ?Medications ? ?Scheduled Meds: ? aspirin  81 mg Oral Daily  ? enoxaparin (LOVENOX) injection  30 mg Subcutaneous Q24H  ? lidocaine (PF)      ? metoprolol tartrate  100 mg Oral BID  ? ?Continuous Infusions: ? cefTRIAXone (ROCEPHIN)  IV Stopped (03/30/21 2324)  ? diltiazem (CARDIZEM) infusion Stopped (03/31/21 1201)  ? ?PRN Meds:. ? ? ? ?Subjective:  ? ?Malasia Torain was seen and examined today.  PT denies any new complaints. No chest pain or sob.  ? ?Objective:  ? ?Vitals:  ? 03/31/21 1158 03/31/21 1200 03/31/21 1235 03/31/21 1330  ?BP: 120/71 (!) 146/58 130/71 (!) 120/59  ?Pulse:  72 82 71  ?Resp:  '11 14 14  '$ ?Temp:      ?TempSrc:      ?SpO2:  96% 97%   ?Weight:      ?Height:      ? ? ?Intake/Output Summary (Last 24 hours) at 03/31/2021 1415 ?Last data filed at 03/31/2021 0417 ?Gross per 24 hour  ?Intake --  ?Output 1625 ml  ?Net -1625 ml  ? ?Filed Weights  ? 03/31/21 0200  ?Weight: 44.2 kg  ? ? ? ?Exam ?General exam: Appears  calm and comfortable  ?Respiratory system: Decreased air entry at bases, on RA.  ?Cardiovascular system: S1 & S2 heard, irregularly irregular, tachycardic No JVD, No pedal edema. ?Gastrointestinal system: Abdomen is nondistended, soft and nontender. Normal bowel sounds heard. ?Central nervous system: Alert and oriented. No focal neurological deficits. ?Extremities: Symmetric 5 x 5 power. ?Skin: No rashes, lesions or ulcers ?Psychiatry: Mood & affect appropriate.  ? ? ?Data Reviewed:  I have personally reviewed following labs and imaging studies ? ? ?CBC ?Lab Results  ?Component Value Date  ? WBC 10.6 (H) 03/31/2021  ? RBC 3.92 03/31/2021  ? HGB 12.9 03/31/2021  ? HCT 37.6 03/31/2021  ? MCV 95.9 03/31/2021  ? MCH 32.9 03/31/2021  ? PLT PLATELET CLUMPS NOTED ON SMEAR, UNABLE TO ESTIMATE 03/31/2021  ? MCHC 34.3 03/31/2021  ? RDW 13.8 03/31/2021  ? LYMPHSABS 1.2 03/31/2021  ? MONOABS 0.9 03/31/2021  ? EOSABS 0.0 03/31/2021  ? BASOSABS 0.0 03/31/2021  ? ? ? ?Last metabolic panel ?Lab Results  ?Component Value Date  ? NA 126 (L) 03/31/2021  ? K 3.7 03/31/2021  ? CL 87 (L) 03/31/2021  ? CO2 24 03/31/2021  ? BUN 18 03/31/2021  ? CREATININE 1.16 (H) 03/31/2021  ? GLUCOSE 101 (H) 03/31/2021  ? GFRNONAA 48 (L) 03/31/2021  ? GFRAA 40 (L) 06/17/2019  ? CALCIUM 9.4 03/31/2021  ? PHOS 3.0 03/21/2021  ? PROT 4.9 (L) 03/31/2021  ? ALBUMIN 3.0 (L) 03/31/2021  ? BILITOT 1.5 (H) 03/31/2021  ? ALKPHOS 99 03/31/2021  ? AST 118 (H) 03/31/2021  ? ALT 251 (H) 03/31/2021  ? ANIONGAP 15 03/31/2021  ? ? ?CBG (last 3)  ?No results for input(s): GLUCAP in the last 72 hours.  ? ? ?Coagulation Profile: ?Recent Labs  ?Lab 03/30/21 ?1841  ?INR 1.4*  ? ? ? ?Radiology Studies: ?MR BRAIN WO CONTRAST ? ?Result Date: 03/31/2021 ?CLINICAL DATA:  Speech slurring EXAM: MRI HEAD WITHOUT CONTRAST TECHNIQUE: Multiplanar, multiecho pulse sequences of the brain and surrounding structures were obtained without intravenous contrast. COMPARISON:  Head CT and CTA  from yesterday FINDINGS: Brain: No acute infarction, hemorrhage, hydrocephalus, extra-axial collection or mass lesion. Small scattered remote cerebellar infarcts, mainly right-sided. Chronic lacunar  infarct at the right putamen. Ischemic gliosis in the deep white matter. Mild for age and generalized cerebral volume loss. Vascular: Normal flow voids. Strong left vertebral artery dominance. Skull and upper cervical spine: Normal marrow signal. Sinuses/Orbits: Bilateral cataract resection. Minor right frontal sinus opacification IMPRESSION: 1. No acute finding. 2. Moderate chronic small vessel ischemia. Small remote cerebellar infarcts. Electronically Signed   By: Jorje Guild M.D.   On: 03/31/2021 05:26  ? ?DG Chest Portable 1 View ? ?Result Date: 03/30/2021 ?CLINICAL DATA:  Altered mental status EXAM: PORTABLE CHEST 1 VIEW COMPARISON:  03/19/2021 FINDINGS: Check shadow is enlarged but stable. Postsurgical changes are again noted. Aortic calcifications are seen. New bilateral pleural effusions are noted with underlying atelectatic changes. No acute bony abnormality is noted. IMPRESSION: New bilateral effusions and atelectatic changes. Electronically Signed   By: Inez Catalina M.D.   On: 03/30/2021 19:28  ? ?CT HEAD CODE STROKE WO CONTRAST ? ?Result Date: 03/30/2021 ?CLINICAL DATA:  Code stroke. Neuro deficit, stroke suspected. Slurred speech EXAM: CT HEAD WITHOUT CONTRAST TECHNIQUE: Contiguous axial images were obtained from the base of the skull through the vertex without intravenous contrast. RADIATION DOSE REDUCTION: This exam was performed according to the departmental dose-optimization program which includes automated exposure control, adjustment of the mA and/or kV according to patient size and/or use of iterative reconstruction technique. COMPARISON:  CT head 08/18/2020 FINDINGS: Brain: There is no evidence of acute intracranial hemorrhage, extra-axial fluid collection, or acute infarct. There is mild global  parenchymal volume loss with prominence of the ventricular system and extra-axial CSF spaces. Confluent hypodensity in the subcortical and periventricular white matter likely reflects sequela of advanced chron

## 2021-04-01 ENCOUNTER — Inpatient Hospital Stay: Payer: Medicare Other

## 2021-04-01 ENCOUNTER — Inpatient Hospital Stay (HOSPITAL_COMMUNITY): Payer: Medicare Other

## 2021-04-01 ENCOUNTER — Ambulatory Visit: Payer: Medicare Other | Admitting: Hematology and Oncology

## 2021-04-01 DIAGNOSIS — G459 Transient cerebral ischemic attack, unspecified: Secondary | ICD-10-CM

## 2021-04-01 LAB — BASIC METABOLIC PANEL
Anion gap: 10 (ref 5–15)
BUN: 17 mg/dL (ref 8–23)
CO2: 29 mmol/L (ref 22–32)
Calcium: 9 mg/dL (ref 8.9–10.3)
Chloride: 91 mmol/L — ABNORMAL LOW (ref 98–111)
Creatinine, Ser: 1.34 mg/dL — ABNORMAL HIGH (ref 0.44–1.00)
GFR, Estimated: 40 mL/min — ABNORMAL LOW (ref >60)
Glucose, Bld: 121 mg/dL — ABNORMAL HIGH (ref 70–99)
Potassium: 3.3 mmol/L — ABNORMAL LOW (ref 3.5–5.1)
Sodium: 130 mmol/L — ABNORMAL LOW (ref 135–145)

## 2021-04-01 LAB — PATHOLOGIST SMEAR REVIEW

## 2021-04-01 LAB — HEPATIC FUNCTION PANEL
ALT: 168 U/L — ABNORMAL HIGH (ref 0–44)
AST: 61 U/L — ABNORMAL HIGH (ref 15–41)
Albumin: 2.9 g/dL — ABNORMAL LOW (ref 3.5–5.0)
Alkaline Phosphatase: 85 U/L (ref 38–126)
Bilirubin, Direct: 0.2 mg/dL (ref 0.0–0.2)
Indirect Bilirubin: 0.9 mg/dL (ref 0.3–0.9)
Total Bilirubin: 1.1 mg/dL (ref 0.3–1.2)
Total Protein: 5 g/dL — ABNORMAL LOW (ref 6.5–8.1)

## 2021-04-01 LAB — ECHOCARDIOGRAM LIMITED BUBBLE STUDY
AR max vel: 1.41 cm2
AV Area VTI: 1.13 cm2
AV Area mean vel: 1.57 cm2
AV Mean grad: 5 mmHg
AV Peak grad: 11.4 mmHg
Ao pk vel: 1.69 m/s
S' Lateral: 2 cm

## 2021-04-01 LAB — MAGNESIUM: Magnesium: 1.5 mg/dL — ABNORMAL LOW (ref 1.7–2.4)

## 2021-04-01 MED ORDER — DILTIAZEM HCL-DEXTROSE 125-5 MG/125ML-% IV SOLN (PREMIX)
5.0000 mg/h | INTRAVENOUS | Status: DC
  Administered 2021-04-01: 5 mg/h via INTRAVENOUS
  Administered 2021-04-02: 7.5 mg/h via INTRAVENOUS
  Administered 2021-04-03 – 2021-04-04 (×2): 5 mg/h via INTRAVENOUS
  Filled 2021-04-01 (×4): qty 125

## 2021-04-01 MED ORDER — ACETAMINOPHEN 325 MG PO TABS: 650.0000 mg | ORAL_TABLET | ORAL | Status: DC | PRN

## 2021-04-01 MED ORDER — POTASSIUM CHLORIDE CRYS ER 20 MEQ PO TBCR
40.0000 meq | EXTENDED_RELEASE_TABLET | Freq: Once | ORAL | Status: AC
  Administered 2021-04-01: 40 meq via ORAL
  Filled 2021-04-01: qty 2

## 2021-04-01 MED ORDER — APIXABAN 2.5 MG PO TABS
2.5000 mg | ORAL_TABLET | Freq: Two times a day (BID) | ORAL | Status: DC
  Administered 2021-04-01 – 2021-04-05 (×10): 2.5 mg via ORAL
  Filled 2021-04-01 (×10): qty 1

## 2021-04-01 NOTE — Progress Notes (Signed)
?  Echocardiogram ?2D Echocardiogram has been performed. ? ?Roseanna Rainbow R ?04/01/2021, 3:10 PM ?

## 2021-04-01 NOTE — Progress Notes (Addendum)
ANTICOAGULATION CONSULT NOTE - Initial Consult ? ?Pharmacy Consult for apixaban ?Indication: atrial fibrillation ? ?No Known Allergies ? ?Patient Measurements: ?Height: 5' (152.4 cm) ?Weight: 42.7 kg (94 lb 2.2 oz) ?IBW/kg (Calculated) : 45.5 ? ?Vital Signs: ?Temp: 97.6 ?F (36.4 ?C) (03/27 5277) ?Temp Source: Oral (03/27 0313) ?BP: 117/70 (03/27 0830) ?Pulse Rate: 92 (03/27 0313) ? ?Labs: ?Recent Labs  ?  03/30/21 ?1841 03/30/21 ?2045 03/30/21 ?2330 03/31/21 ?8242 03/31/21 ?1157  ?HGB 13.7  --  13.4 12.9  --   ?HCT 40.1  --  39.0 37.6  --   ?PLT 135*  --  125* PLATELET CLUMPS NOTED ON SMEAR, UNABLE TO ESTIMATE  --   ?APTT 27  --   --   --   --   ?LABPROT 17.3*  --   --   --   --   ?INR 1.4*  --   --   --   --   ?CREATININE 0.98  --  1.00 1.04* 1.16*  ?TROPONINIHS 18* 15  --   --   --   ? ? ?Estimated Creatinine Clearance: 26.1 mL/min (A) (by C-G formula based on SCr of 1.16 mg/dL (H)). ? ? ?Medical History: ?Past Medical History:  ?Diagnosis Date  ? Aortic stenosis   ? a. Echocardiogram (01/11/14):  Mild LVH, EF 60-65%, Gr 1 DD, possible bicuspid AV, severe AS (mean 61 mmHg, peak 104 mmHg), mild MVP of post leaflet, mild MR, PASP 33 mmHg.;  b. 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 Arthritis   ? Atrial fibrillation ([place])   ? post op after CABG+AVR >> Amiodarone  ? CAD (coronary artery disease)   ? a. LHC (01/13/14):  dLM 70 extending into oLAD, mLAD 40-50, pD1 80, pD2 70, ostial/prox OM1 70 >> CABG (L-LAD, S-OM1, S-D1, S-D2)  // Myoview 10/22: Fixed defect anterolateral wall consistent with artifact, EF 71, no ischemia, low risk  ? Carotid artery occlusion   ? a. s/p L CEA 2013;  b.  Carotid US (1/16):  Bilateral ICA 1-39% // Carotid US 10/22: Bilateral ICA 1-39  ? CLL (chronic lymphocytic leukemia) ([place])   ? slow leukemia---Dr  Murinson  ? H/O exercise stress test   ? NSSTT  ? Hx of cardiovascular stress test   ? a. Nuclear stress test That (4/13): Normal perfusion, EF 74%  ? Hx of echocardiogram   ? Echo (2/16):  Mild LVH,  EF 60-65%, no RWMA, Gr 2 DD, AVR ok (mean 9 mmHg), trivial AI, mild MR, mild LAE, PASP 40 mmHg  ? Hypertension   ? PAD (peripheral artery disease) ([place])   ? LE Arterial US 10/22: R - pCFA, oSFA, dSFA 30-49; L - pCFA + mSFA 50-74, pPop 30-49 >> referred to [facility]  ? Pancreatitis   ? h/o  ? Thrombocytopenia ([place]) 11/19/2010  ? ? ?Medications:  ?Medications Prior to Admission  ?Medication Sig Dispense Refill Last Dose  ? apixaban (ELIQUIS) 2.5 MG TABS tablet Take 1 tablet (2.5 mg total) by mouth 2 (two) times daily. 60 tablet 0 03/30/2021 at 0900  ? aspirin 81 MG tablet Take 81 mg by mouth daily.   03/30/2021  ? atorvastatin (LIPITOR) 10 MG tablet TAKE 1 TABLET BY MOUTH ONCE DAILY AT  6  IN  THE  EVENING (Patient taking differently: Take 10 mg by mouth daily.) 90 tablet 2 03/30/2021  ? Calcium Carb-Cholecalciferol 500-125 MG-UNIT TABS Take 1 tablet by mouth daily.   03/30/2021  ? metoprolol tartrate (LOPRESSOR) 100 MG tablet  Take 1 tablet (100 mg total) by mouth 2 (two) times daily. 180 tablet 3 03/30/2021 at 0900  ? Multiple Vitamins-Minerals (HM MULTIVITAMIN ADULT GUMMY PO) Take 1 tablet by mouth daily.   03/30/2021  ? oxybutynin (DITROPAN-XL) 10 MG 24 hr tablet Take 10 mg by mouth daily.   03/30/2021  ? acetaminophen (TYLENOL) 325 MG tablet Take 650 mg by mouth every 6 (six) hours as needed for mild pain or headache.      ? ? ?Assessment: ?54 yof admitted for TIA vs syncope work-up. She is on apixaban PTA for Afib. No bleeding noted, Hgb stable 12-13s, platelets are low on admit (hx CLL). Home dose (2.5 mg) is appropriate for age and weight. ? ?Plan:  ?Apixaban 2.5 mg PO bid ?Pharmacy signing off but will continue to follow peripherally - please re-consult if needed ? ?Thank you for involving pharmacy in this patient's care. ? ?Renold Genta, PharmD, BCPS ?Clinical Pharmacist ?Clinical phone for 04/01/2021 until 3p is [phone] ?04/01/2021 9:40 AM ? ?**Pharmacist phone directory can be found on [email or web address] listed under [place]** ? ? ?

## 2021-04-01 NOTE — [place] FL2 (Signed)
?[place] MEDICAID FL2 LEVEL OF CARE SCREENING TOOL  ?  ? ?IDENTIFICATION  ?Patient Name: ?Nicole Mercado Birthdate: 11/13/1940 Sex: female Admission Date (Current Location): ?03/30/2021  ?[place] and [place] Number: ? [place] ?  Facility and Address:  ?[facility]. [facility], [place] [address], [place], [place] [address] ?     Provider Number: ?[id number]  ?Attending Physician Name and Address:  ?Hosie Poisson, MD ? Relative Name and Phone Number:  ?  ?   ?Current Level of Care: ?Hospital Recommended Level of Care: ?[place] Prior Approval Number: ?  ? ?Date Approved/Denied: ?  PASRR Number: ?[id number] A ? ?Discharge Plan: ?SNF ?  ? ?Current Diagnoses: ?Patient Active Problem List  ? Diagnosis Date Noted  ? TIA (transient ischemic attack) 03/30/2021  ? Elevated LFTs 03/30/2021  ? Hyponatremia 03/30/2021  ? Atrial fibrillation with RVR ([place]) 03/19/2021  ? PAD (peripheral artery disease) ([place]) 10/19/2020  ? CKD (chronic kidney disease), stage III ([place]) 03/25/2019  ? History of basal cell cancer 02/11/2019  ? PAC (premature atrial contraction) 01/14/2019  ? Goals of care, counseling/discussion 12/08/2018  ? Skin lesion 12/18/2016  ? Hypercalcemia 06/19/2016  ? Drug-induced neutropenia ([place]) 05/27/2016  ? Hematuria, gross 08/28/2015  ? Easy bruising 05/31/2015  ? Encounter for chemotherapy management 11/03/2014  ? Essential hypertension 10/30/2014  ? 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 S/P AVR 01/17/2014  ? CAD (coronary artery disease), native coronary artery 01/16/2014  ? Aortic stenosis 01/11/2014  ? Arrhythmia 01/11/2014  ? Syncope and collapse 01/11/2014  ? Faintness   ? Aftercare following surgery of the circulatory system, [place] 08/18/2013  ? Occlusion and stenosis of carotid artery without mention of cerebral infarction 06/03/2011  ? CLL (chronic lymphocytic leukemia) ([place]) 03/21/2011  ? Cardiac murmur 03/21/2011  ? Carotid artery stenosis 03/21/2011  ? Thrombocytopenia ([place])  11/19/2010  ? Lymphadenopathy 11/11/2010  ? ? ?Orientation RESPIRATION BLADDER Height & Weight   ?  ?Self ? Normal Incontinent Weight: 42.7 kg ?Height:  5' (152.4 cm)  ?BEHAVIORAL SYMPTOMS/MOOD NEUROLOGICAL BOWEL NUTRITION STATUS  ?    Continent Diet (heart healthy with 1200 CC FR)  ?AMBULATORY STATUS COMMUNICATION OF NEEDS Skin   ?Limited Assist Verbally Normal ?  ?  ?  ?    ?     ?     ? ? ?Personal Care Assistance Level of Assistance  ?Bathing, Feeding, Dressing Bathing Assistance: Limited assistance ?Feeding assistance: Independent ?Dressing Assistance: Limited assistance ?   ? ?Functional Limitations Info  ?Sight, Hearing, Speech Sight Info: Adequate ?Hearing Info: Adequate ?Speech Info: Adequate  ? ? ?SPECIAL CARE FACTORS FREQUENCY  ?PT (By licensed PT), OT (By licensed OT), Speech therapy   ?  ?PT Frequency: 5x/wk ?OT Frequency: 5x/wk ?  ?  ?Speech Therapy Frequency: 3x/wk ?   ? ? ?Contractures Contractures Info: Not present  ? ? ?Additional Factors Info  ?Code Status, Allergies Code Status Info: Full ?Allergies Info: NKA ?  ?  ?  ?   ? ?Current Medications (04/01/2021):  This is the current hospital active medication list ?Current Facility-Administered Medications  ?Medication Dose Route Frequency Provider Last Rate Last Admin  ? acetaminophen (TYLENOL) tablet 650 mg  650 mg Oral Q4H PRN Hosie Poisson, MD      ? apixaban Arne Cleveland) tablet 2.5 mg  2.5 mg Oral BID Alvira Philips, RPH   2.5 mg at 04/01/21 0945  ? aspirin chewable tablet 81 mg  81 mg Oral Daily Gean Birchwood  N, MD   81 mg at 04/01/21 0940  ? cefTRIAXone (ROCEPHIN) 1 g in sodium chloride 0.9 % 100 mL IVPB  1 g Intravenous Q24H Rise Patience, MD 200 mL/hr at 03/31/21 2230 1 g at 03/31/21 2230  ? diltiazem (CARDIZEM) 125 mg in dextrose 5% 125 mL (1 mg/mL) infusion  5-15 mg/hr Intravenous Titrated Hosie Poisson, MD      ? metoprolol tartrate (LOPRESSOR) tablet 100 mg  100 mg Oral BID Rise Patience, MD   100 mg at 04/01/21 0940   ? ? ? ?Discharge Medications: ?Please see discharge summary for a list of discharge medications. ? ?Relevant Imaging Results: ? ?Relevant Lab Results: ? ? ?Additional Information ?SS#: [id number] ? ?Pollie Friar, RN ? ? ? ? ?

## 2021-04-01 NOTE — Progress Notes (Signed)
? ? ? [facility] ?                                                                            ? ? ?Nicole Mercado, is a 81 y.o. female, DOB - 03/20/1940, AQT:[id number] ?Admit date - 03/30/2021    ?Outpatient Primary MD for the patient is Kristie Cowman, MD ? ?LOS - 1  days ? ? ? ?Brief summary  ? ?Nicole Mercado is a 81 y.o. 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 ? ?CT head did not show anything acute and this was followed by CT angiogram of the head and neck which did not show any large vessel obstruction.  Patient also was in A-fib with RVR was started on Cardizem infusion. ? ? ?Assessment & Plan  ? ? ?Assessment and Plan: ? ?Difficulty speech/syncope: ?So far work-up has been negative for an acute stroke. Possibly from UTI.  ?Continue with aspirin. ?Hemoglobin A1c is 5.3% ?Ldl IS 33.  ? ? ?Atrial fibrillation with RVR on cardizem. ?She was weaned off cardizem yesterday, but her rate is back in 120 to 130/min today , we will put her back on cardizem gtt, and request cardiology ?Continue with metoprolol 100 mg BID.  ?Echocardiogram showed  Left ventricular ejection fraction is 60 to 65%. The left ventricle has normal function. The left ventricle has no regional wall motion abnormalities. There is moderate left ventricular hypertrophy. Left ventricular diastolic parameters are indeterminate. ?Continue with eliquis for anti coagulation.  ? ? ? ?CAD s/p CABG; ?No chest pain or sob today.   ? ? ?H/o CLL;  ?Outpatient follow up with PCP.  ? ? ? ?UTI ?urine cultures are pending they show 1,00,000 E coli. Currently on rocephin.  ? ? ?Hyponatremia:  ?Possibly from fluid overload. Improving.  ?Urine osmo  is 265 and urine sodium is 91. ? ? ?Elevated liver enzymes possibly from congestion from CHF.  ?US abdomen shows fatty liver disease.  ?Hepatitis panel is wnl.  ? ? ?Chronic diastolic heart failure : ?Elevated BNP.  ? ? ?Large bilateral pleural effusions:  ?IR consulted for thoracentesis and 600 ml of pleural fluid aspirated.  ?Fluid sent for analysis and pending.  ? ? ? ?Hypokalemia  ?Replaced.  ? ? ?Mild leukocytosis:  ?Recheck cbc in am.  ? ?  ? ?Estimated body mass index is 18.38 kg/m? as calculated from the following: ?  Height as of this encounter: 5' (1.524 m). ?  Weight as of this encounter: 42.7 kg. ? ?Code Status: full code.  ?DVT Prophylaxis:  apixaban (ELIQUIS) tablet 2.5 mg Start: 04/01/21 1030 ?apixaban (ELIQUIS) tablet 2.5 mg  ? ?Level of Care: Level of care: Progressive ?Family Communication: none at bedside.  ? ?Disposition Plan:     Remains inpatient appropriate:  further work up for hyponatremia and Atrial fib.   ? ?Procedures:  ?Echocardiogram.  ? ?Consultants:   ?  Neurology.  ? ?Antimicrobials:  ? ?Anti-infectives (From admission, onward)  ? ? Start     Dose/Rate Route Frequency Ordered Stop  ? 03/30/21 2245  cefTRIAXone (ROCEPHIN) 1 g in sodium chloride 0.9 % 100 mL IVPB       ? 1 g ?200 mL/hr over 30 Minutes Intravenous Every 24 hours 03/30/21 2230    ? ?  ? ? ? ?Medications ? ?Scheduled Meds: ? apixaban  2.5 mg Oral BID  ? aspirin  81 mg Oral Daily  ? metoprolol tartrate  100 mg Oral BID  ? potassium chloride  40 mEq Oral Once  ? ?Continuous Infusions: ? cefTRIAXone (ROCEPHIN)  IV 1 g (03/31/21 2230)  ? diltiazem (CARDIZEM) infusion    ? ?PRN Meds:. ? ? ? ?Subjective:  ? ?Nicole Mercado was seen and examined today.  No new complaints today.  ? ?Objective:  ? ?Vitals:  ? 04/01/21 0830 04/01/21 0940 04/01/21 1044 04/01/21 1120  ?BP: 117/70   117/67  ?Pulse:  (!) 130 100 (!) 105  ?Resp:    18  ?Temp:    97.7 ?F (36.5 ?C)  ?TempSrc:    Oral  ?SpO2: 100%   100%  ?Weight:      ?Height:       ? ? ?Intake/Output Summary (Last 24 hours) at 04/01/2021 1357 ?Last data filed at 04/01/2021 0100 ?Gross per 24 hour  ?Intake 420 ml  ?Output 250 ml  ?Net 170 ml  ? ? ?Filed Weights  ? 03/31/21 0200 04/01/21 0316  ?Weight: 44.2 kg 42.7 kg  ? ? ? ?Exam ?General exam: pleasant elderly lady not in distress.  ?Respiratory system: Clear to auscultation. Respiratory effort normal. ?Cardiovascular system: S1 & S2 heard, irregularly irregular, no JVD. Marland Kitchen No pedal edema. ?Gastrointestinal system: Abdomen is nondistended, soft and nontender.  Normal bowel sounds heard. ?Central nervous system: Alert and oriented to person and place, appears to have some memory deficits.  ?Extremities: Symmetric 5 x 5 power. ?Skin: No rashes, lesions or ulcers ?Psychiatry:  Mood & affect appropriate.  ? ? ? ?Data Reviewed:  I have personally reviewed following labs and imaging studies ? ? ?CBC ?Lab Results  ?Component Value Date  ? WBC 10.6 (H) 03/31/2021  ? RBC 3.92 03/31/2021  ? HGB 12.9 03/31/2021  ? HCT 37.6 03/31/2021  ? MCV 95.9 03/31/2021  ? MCH 32.9 03/31/2021  ? PLT PLATELET CLUMPS NOTED ON SMEAR, UNABLE TO ESTIMATE 03/31/2021  ? MCHC 34.3 03/31/2021  ? RDW 13.8 03/31/2021  ? LYMPHSABS 1.2 03/31/2021  ? MONOABS 0.9 03/31/2021  ? EOSABS 0.0 03/31/2021  ? BASOSABS 0.0 03/31/2021  ? ? ? ?Last metabolic panel ?Lab Results  ?Component Value Date  ? NA 130 (L) 04/01/2021  ? K 3.3 (L) 04/01/2021  ? CL 91 (L) 04/01/2021  ? CO2 29 04/01/2021  ? BUN 17 04/01/2021  ? CREATININE 1.34 (H) 04/01/2021  ? GLUCOSE 121 (H) 04/01/2021  ? GFRNONAA 40 (L) 04/01/2021  ? GFRAA 40 (L) 06/17/2019  ? CALCIUM 9.0 04/01/2021  ? PHOS 3.0 03/21/2021  ? PROT 4.9 (L) 03/31/2021  ? ALBUMIN 3.0 (L) 03/31/2021  ? BILITOT 1.5 (H) 03/31/2021  ? ALKPHOS 99 03/31/2021  ? AST 118 (H) 03/31/2021  ? ALT 251 (H) 03/31/2021  ? ANIONGAP 10 04/01/2021  ? ? ?CBG (last 3)  ?No results for input(s): GLUCAP in the last 72 hours.  ? ? ?Coagulation Profile: ?Recent Labs  ?Lab  03/30/21 ?1841  ?INR 1.4*  ? ? ? ? ?  Radiology Studies: ?DG Chest 1 View ? ?Result Date: 03/31/2021 ?CLINICAL DATA:  Status post thoracentesis EXAM: CHEST  1 VIEW COMPARISON:  Chest radiograph 1 day prior FINDINGS: Median sternotomy wires, mediastinal surgical clips, and aortic valve prosthesis are stable. The right pleural effusion has significantly decreased in size following thoracentesis. Is no pneumothorax. There is minimal right basilar atelectasis. The left pleural effusion is not significantly changed in size, with persistent left basilar opacities. The left upper lobe remains well-aerated. There is no appreciable left pneumothorax. The bones are stable. IMPRESSION: 1. Decreased size of the right pleural effusion following thoracentesis. No pneumothorax. 2. Stable left pleural effusion with adjacent left basilar opacity. Electronically Signed   By: Valetta Mole M.D.   On: 03/31/2021 15:11  ? ?MR BRAIN WO CONTRAST ? ?Result Date: 03/31/2021 ?CLINICAL DATA:  Speech slurring EXAM: MRI HEAD WITHOUT CONTRAST TECHNIQUE: Multiplanar, multiecho pulse sequences of the brain and surrounding structures were obtained without intravenous contrast. COMPARISON:  Head CT and CTA from yesterday FINDINGS: Brain: No acute infarction, hemorrhage, hydrocephalus, extra-axial collection or mass lesion. Small scattered remote cerebellar infarcts, mainly right-sided. Chronic lacunar infarct at the right putamen. Ischemic gliosis in the deep white matter. Mild for age and generalized cerebral volume loss. Vascular: Normal flow voids. Strong left vertebral artery dominance. Skull and upper cervical spine: Normal marrow signal. Sinuses/Orbits: Bilateral cataract resection. Minor right frontal sinus opacification IMPRESSION: 1. No acute finding. 2. Moderate chronic small vessel ischemia. Small remote cerebellar infarcts. Electronically Signed   By: Jorje Guild M.D.   On: 03/31/2021 05:26  ? ?DG Chest Portable 1 View ? ?Result Date:  03/30/2021 ?CLINICAL DATA:  Altered mental status EXAM: PORTABLE CHEST 1 VIEW COMPARISON:  03/19/2021 FINDINGS: Check shadow is enlarged but stable. Postsurgical changes are again noted. Aortic calcifications are seen. New bil

## 2021-04-01 NOTE — Progress Notes (Signed)
Occupational Therapy Treatment ?Patient Details ?Name: Nicole Mercado ?MRN: [id number] ?DOB: 03-03-1940 ?Today's Date: 04/01/2021 ? ? ?History of present illness 81 y.o. femalewas brought to the ER 03/30/21 after patient's friend noticed that patient had difficulty to speak and also her speech was not making sense. In ER pt initially had some difficulty speaking but gradually became back to baseline. CT head and CT angiogram with no acute findings Pt found to be in A-fib with RVR. BNP was elevated at 900 chest x-ray shows bilateral pleural effusion which is new. Admitted overnight for obsevarion.  PMH: CAD status post CABG and bioprosthetic aortic valve replacement, history of CLL who was recently admitted and discharged about 10 days ago for A-fib with RVR at the time patient also had a syncopal episode ?  ?OT comments ? Pt seen to assess cognition, engaged in medi cog assessment.  Patient demonstrating difficulty recalling 3 word recall, completing clock drawing, and completing medi cog portion (following directions, problem solving, awareness of errors/deficits). Pt reports some difficulty at home managing medications, but she has managed.  At this time recommend pt have 24/7 supervision, assist with all IADLs (meds, cooking) and driving. Granddaughter present in room, discussed options of rehab, using pill packets. Will continue to follow acutely.   ? ?Recommendations for follow up therapy are one component of a multi-disciplinary discharge planning process, led by the attending physician.  Recommendations may be updated based on patient status, additional functional criteria and insurance authorization. ?   ?Follow Up Recommendations ? Skilled nursing-short term rehab (<3 hours/day)  ?  ?Assistance Recommended at Discharge Frequent or constant Supervision/Assistance  ?Patient can return home with the following ? A little help with walking and/or transfers;A little help with bathing/dressing/bathroom;Assistance with  cooking/housework;Assist for transportation;Help with stairs or ramp for entrance;Direct supervision/assist for medications management;Direct supervision/assist for financial management ?  ?Equipment Recommendations ? Other (comment) (RW)  ?  ?Recommendations for Other Services   ? ?  ?Precautions / Restrictions Precautions ?Precautions: Fall ?Precaution Comments: hx of falling ?Restrictions ?Weight Bearing Restrictions: No  ? ? ?  ? ?Mobility Bed Mobility ?Overal bed mobility: Modified Independent ?  ?  ?  ?  ?  ?  ?  ?  ? ?Transfers ?  ?  ?  ?  ?  ?  ?  ?  ?  ?  ?  ?  ?Balance   ?  ?  ?  ?  ?  ?  ?  ?  ?  ?  ?  ?  ?  ?  ?  ?  ?  ?  ?   ? ?ADL either performed or assessed with clinical judgement  ? ?ADL   ?  ?  ?  ?  ?  ?  ?  ?  ?  ?  ?  ?  ?  ?  ?  ?  ?  ?  ?  ?General ADL Comments: focused on cognition ?  ? ?Extremity/Trunk Assessment   ?  ?  ?  ?  ?  ? ?Vision   ?  ?  ?Perception   ?  ?Praxis   ?  ? ?Cognition Arousal/Alertness: Awake/alert ?Behavior During Therapy: Flat affect ?Overall Cognitive Status: Impaired/Different from baseline ?Area of Impairment: Memory, Following commands, Safety/judgement, Awareness, Problem solving, Attention ?  ?  ?  ?  ?  ?  ?  ?  ?  ?Current Attention Level: Sustained ?Memory: Decreased short-term memory ?Following Commands: Follows one  step commands consistently ?Safety/Judgement: Decreased awareness of deficits, Decreased awareness of safety ?Awareness: Emergent ?Problem Solving: Requires verbal cues, Requires tactile cues, Difficulty sequencing, Slow processing ?General Comments: engaged in [place]-- ?0/3 3 word recall ?0/2 clock drawing (pt writing 35 numbers in clock around inside of circle and drawing a straight line from 11 to 24)  ?1/5 medi-cog (pt only placing 1 mark in each box ?if she actually was following directions at all) ?  ?  ?   ?Exercises   ? ?  ?Shoulder Instructions   ? ? ?  ?General Comments granddaughter present and supportive, discussed recommendations  for pt having 24/7 support, NOT driving and having assist with all IADLs (meds, meals)  ? ? ?Pertinent Vitals/ Pain       Pain Assessment ?Pain Assessment: No/denies pain ? ?Home Living   ?  ?  ?  ?  ?  ?  ?  ?  ?  ?  ?  ?  ?  ?  ?  ?  ?  ?  ? ?  ?Prior Functioning/Environment    ?  ?  ?  ?   ? ?Frequency ? Min 2X/week  ? ? ? ? ?  ?Progress Toward Goals ? ?OT Goals(current goals can now be found in the care plan section) ? Progress towards OT goals: Progressing toward goals ? ?Acute Rehab OT Goals ?Patient Stated Goal: home ?OT Goal Formulation: With patient ?Time For Goal Achievement: 04/14/21 ?Potential to Achieve Goals: Good  ?Plan Discharge plan needs to be updated;Frequency remains appropriate   ? ?Co-evaluation ? ? ?   ?  ?  ?  ?  ? ?  ?AM-PAC OT "6 Clicks" Daily Activity     ?Outcome Measure ? ? Help from another person eating meals?: None ?Help from another person taking care of personal grooming?: A Little ?Help from another person toileting, which includes using toliet, bedpan, or urinal?: A Little ?Help from another person bathing (including washing, rinsing, drying)?: A Little ?Help from another person to put on and taking off regular upper body clothing?: A Little ?Help from another person to put on and taking off regular lower body clothing?: A Little ?6 Click Score: 19 ? ?  ?End of Session   ? ?OT Visit Diagnosis: Unsteadiness on feet (R26.81);Other abnormalities of gait and mobility (R26.89);Muscle weakness (generalized) (M62.81);Other symptoms and signs involving cognitive function ?  ?Activity Tolerance Patient tolerated treatment well ?  ?Patient Left in bed;with call bell/phone within reach ?  ?Nurse Communication Mobility status ?  ? ?   ? ?Time: 1250-1311 ?OT Time Calculation (min): 21 min ? ?Charges: OT General Charges ?$OT Visit: 1 Visit ?OT Treatments ?$Self Care/Home Management : 8-22 mins ? ?Jolaine Artist, OT ?Acute Rehabilitation Services ?Pager [phone] ?Office  [phone] ? ? ?Delight Stare ?04/01/2021, 2:02 PM ?

## 2021-04-01 NOTE — Progress Notes (Signed)
Physical Therapy Treatment ?Patient Details ?Name: Nicole Mercado ?MRN: [id number] ?DOB: [birth year] ?Today's Date: 04/01/2021 ? ? ?History of Present Illness 81 y.o. femalewas brought to the ER 03/30/21 after patient's friend noticed that patient had difficulty to speak and also her speech was not making sense. In ER pt initially had some difficulty speaking but gradually became back to baseline. CT head and CT angiogram with no acute findings Pt found to be in A-fib with RVR. BNP was elevated at 900 chest x-ray shows bilateral pleural effusion which is new. Admitted overnight for obsevarion.  PMH: CAD status post CABG and bioprosthetic aortic valve replacement, history of CLL who was recently admitted and discharged about 10 days ago for A-fib with RVR at the time patient also had a syncopal episode ? ?  ?PT Comments  ? ? Pt with some confusion and impaired comprehension, problem solving and decreased insight to deficits. Pt unsteady and at increased fall risk with and without and AD. Pt did report having a recent fall as well. At this time for pt to return home she would need 24/7 assist, assist with IADLs, and someone to meet her transportation needs. Granddaughter present and aware and plans to talk to family as every one is out of town. Acute PT to cont to follow. ?   ?Recommendations for follow up therapy are one component of a multi-disciplinary discharge planning process, led by the attending physician.  Recommendations may be updated based on patient status, additional functional criteria and insurance authorization. ? ?Follow Up Recommendations ? Skilled nursing-short term rehab (<3 hours/day) ?  ?  ?Assistance Recommended at Discharge Frequent or constant Supervision/Assistance  ?Patient can return home with the following A little help with walking and/or transfers;A little help with bathing/dressing/bathroom;Direct supervision/assist for medications management;Direct supervision/assist for financial  management;Assist for transportation ?  ?Equipment Recommendations ? Rolling walker (2 wheels) (youth)  ?  ?Recommendations for Other Services   ? ? ?  ?Precautions / Restrictions Precautions ?Precautions: Fall ?Precaution Comments: hx of falling ?Restrictions ?Weight Bearing Restrictions: No  ?  ? ?Mobility ? Bed Mobility ?Overal bed mobility: Modified Independent ?  ?  ?  ?  ?  ?  ?General bed mobility comments: with HOB flat pt able to bring self up to EOB ?  ? ?Transfers ?Overall transfer level: Needs assistance ?Equipment used: None ?Transfers: Sit to/from Stand ?Sit to Stand: Min guard ?  ?  ?  ?  ?  ?General transfer comment: good power up however mildly unsteady upon stand ?  ? ?Ambulation/Gait ?Ambulation/Gait assistance: Min assist ?Gait Distance (Feet): 75 Feet (x2) ?Assistive device: None, Rolling walker (2 wheels) ?Gait Pattern/deviations: Step-through pattern, Decreased step length - right, Decreased step length - left, Shuffle, Staggering left, Staggering right ?Gait velocity: slowed ?Gait velocity interpretation: <1.31 ft/sec, indicative of household ambulator ?  ?General Gait Details: pt initially amb without AD, pt with very narrow base of support and several episodes of LOB with cross over gait to catch self requiring minA to prevent falling. pt then given RW for second bout, pt requiring max verbal and tactile cues for safe walker management, not to push too far forward, pt tripping over walker during turning requiring minA ? ? ?Stairs ?  ?  ?  ?  ?  ? ? ?Wheelchair Mobility ?  ? ?Modified Rankin (Stroke Patients Only) ?Modified Rankin (Stroke Patients Only) ?Pre-Morbid Rankin Score: Slight disability ?Modified Rankin: Moderately severe disability ? ? ?  ?Balance Overall balance assessment:  Needs assistance ?Sitting-balance support: No upper extremity supported, Feet supported, Feet unsupported ?Sitting balance-Leahy Scale: Good ?  ?  ?Standing balance support: No upper extremity supported,  Single extremity supported, Bilateral upper extremity supported, During functional activity ?Standing balance-Leahy Scale: Fair ?Standing balance comment: does better with UE support during dynamic activity ?  ?  ?  ?  ?  ?  ?  ?  ?  ?  ?  ?  ? ?  ?Cognition Arousal/Alertness: Awake/alert ?Behavior During Therapy: Flat affect ?Overall Cognitive Status: Impaired/Different from baseline ?Area of Impairment: Memory, Following commands, Safety/judgement, Awareness, Problem solving, Attention ?  ?  ?  ?  ?  ?  ?  ?  ?  ?Current Attention Level: Sustained ?Memory: Decreased short-term memory ?Following Commands: Follows one step commands consistently ?Safety/Judgement: Decreased awareness of deficits, Decreased awareness of safety ?Awareness: Emergent ?Problem Solving: Requires verbal cues, Requires tactile cues, Difficulty sequencing, Slow processing ?General Comments: pt flat, mild confusion/comprehension. when asked if pt lived alone "pt said oh I don't know about that. Then when asked who she lived with, pt said "myself" Pt with decreased insight to deficits and safety ?  ?  ? ?  ?Exercises   ? ?  ?General Comments General comments (skin integrity, edema, etc.): granddtr present, aware pt is unsafe to d/c home alone however pt also doesn't have 24/7 assist ?  ?  ? ?Pertinent Vitals/Pain Pain Assessment ?Pain Assessment: No/denies pain  ? ? ?Home Living   ?  ?  ?  ?  ?  ?  ?  ?  ?  ?   ?  ?Prior Function    ?  ?  ?   ? ?PT Goals (current goals can now be found in the care plan section) Acute Rehab PT Goals ?Patient Stated Goal: go home ?PT Goal Formulation: With patient ?Time For Goal Achievement: 04/14/21 ?Potential to Achieve Goals: Fair ?Progress towards PT goals: Progressing toward goals ? ?  ?Frequency ? ? ? Min 3X/week ? ? ? ?  ?PT Plan Discharge plan needs to be updated  ? ? ?Co-evaluation   ?  ?  ?  ?  ? ?  ?AM-PAC PT "6 Clicks" Mobility   ?Outcome Measure ? Help needed turning from your back to your side while  in a flat bed without using bedrails?: None ?Help needed moving from lying on your back to sitting on the side of a flat bed without using bedrails?: None ?Help needed moving to and from a bed to a chair (including a wheelchair)?: A Little ?Help needed standing up from a chair using your arms (e.g., wheelchair or bedside chair)?: A Little ?Help needed to walk in hospital room?: A Little ?Help needed climbing 3-5 steps with a railing? : A Lot ?6 Click Score: 19 ? ?  ?End of Session Equipment Utilized During Treatment: Gait belt ?Activity Tolerance: Patient tolerated treatment well ?Patient left: with call bell/phone within reach;with family/visitor present;in bed;with bed alarm set ?Nurse Communication: Mobility status ?PT Visit Diagnosis: Unsteadiness on feet (R26.81);Repeated falls (R29.6);Muscle weakness (generalized) (M62.81);Difficulty in walking, not elsewhere classified (R26.2) ?  ? ? ?Time: 4967-5916 ?PT Time Calculation (min) (ACUTE ONLY): 18 min ? ?Charges:  $Gait Training: 8-22 mins          ?          ? ?Kittie Plater, PT, DPT ?Acute Rehabilitation Services ?Pager #: [phone] ?Office #: [phone] ? ? ? ?Makaya Juneau M Drezden Seitzinger ?04/01/2021, 2:46 PM ? ?

## 2021-04-01 NOTE — TOC Initial Note (Addendum)
Transition of Care (TOC) - Initial/Assessment Note  ? ? ?Patient Details  ?Name: Nicole Mercado ?MRN: [id number] ?Date of Birth: 04-06-1940 ? ?Transition of Care (TOC) CM/SW Contact:    ?Pollie Friar, RN ?Phone Number: ?04/01/2021, 11:30 AM ? ?Clinical Narrative:                 ?Patient is from home alone. She has a friend, Darnelle Maffucci that can check in on her but can not provide 24 hour supervision.  ?Per granddaughter at the bedside she is the closest relative but she is 3 hours away.  ?Pt has been driving self to appointments, managing her own medications.  ?PT/OT have been recommending home health services. Pt would not have supervision at home. Awaiting OT to reeval.  ?TOC following.  ? ?1512: Recommendations have changed to SNF rehab. CM has updated the family/ pt and they are agreeable to having patient faxed out in the [facility] area. Will provide bed offers in the am. ?CM also provided the granddaughter with resource to assist with ALF and caregiver services. ? ?Expected Discharge Plan: [place] ?Barriers to Discharge: Continued Medical Work up ? ? ?Patient Goals and CMS Choice ?  ?  ?  ? ?Expected Discharge Plan and Services ?Expected Discharge Plan: [place] ?  ?  ?  ?Living arrangements for the past 2 months: Apartment ?                ?  ?  ?  ?  ?  ?  ?  ?  ?  ?  ? ?Prior Living Arrangements/Services ?Living arrangements for the past 2 months: Apartment ?Lives with:: Self ?Patient language and need for interpreter reviewed:: Yes ?Do you feel safe going back to the place where you live?: Yes      ?  ?Care giver support system in place?: No (comment) ?  ?Criminal Activity/Legal Involvement Pertinent to Current Situation/Hospitalization: No - Comment as needed ? ?Activities of Daily Living ?  ?  ? ?Permission Sought/Granted ?  ?  ?   ?   ?   ?   ? ?Emotional Assessment ?Appearance:: Appears stated age ?Attitude/Demeanor/Rapport: Engaged ?Affect (typically observed):  Accepting ?Orientation: : Oriented to Self, Oriented to Place ?  ?Psych Involvement: No (comment) ? ?Admission diagnosis:  TIA (transient ischemic attack) [G45.9] ?Hyponatremia [E87.1] ?Pleural effusion [J90] ?Atrial fibrillation with RVR ([place]) [I48.91] ?Patient Active Problem List  ? Diagnosis Date Noted  ? TIA (transient ischemic attack) 03/30/2021  ? Elevated LFTs 03/30/2021  ? Hyponatremia 03/30/2021  ? Atrial fibrillation with RVR ([place]) 03/19/2021  ? PAD (peripheral artery disease) ([place]) 10/19/2020  ? CKD (chronic kidney disease), stage III ([place]) 03/25/2019  ? History of basal cell cancer 02/11/2019  ? PAC (premature atrial contraction) 01/14/2019  ? Goals of care, counseling/discussion 12/08/2018  ? Skin lesion 12/18/2016  ? Hypercalcemia 06/19/2016  ? Drug-induced neutropenia ([place]) 05/27/2016  ? Hematuria, gross 08/28/2015  ? Easy bruising 05/31/2015  ? Encounter for chemotherapy management 11/03/2014  ? Essential hypertension 10/30/2014  ? 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 S/P AVR 01/17/2014  ? CAD (coronary artery disease), native coronary artery 01/16/2014  ? Aortic stenosis 01/11/2014  ? Arrhythmia 01/11/2014  ? Syncope and collapse 01/11/2014  ? Faintness   ? Aftercare following surgery of the circulatory system, [place] 08/18/2013  ? Occlusion and stenosis of carotid artery without mention of cerebral infarction 06/03/2011  ? CLL (chronic lymphocytic  leukemia) ([place]) 03/21/2011  ? Cardiac murmur 03/21/2011  ? Carotid artery stenosis 03/21/2011  ? Thrombocytopenia ([place]) 11/19/2010  ? Lymphadenopathy 11/11/2010  ? ?PCP:  Kristie Cowman, MD ?Pharmacy:   ?[place], [place] [facility] [address] ?[address] ?[place] [address] ?Phone: [phone] Fax: [phone] ? ? ? ? ?Social Determinants of Health (SDOH) Interventions ?  ? ?Readmission Risk Interventions ?   ? View : No data to display.  ?  ?  ?  ? ? ? ?

## 2021-04-02 ENCOUNTER — Encounter (HOSPITAL_COMMUNITY): Payer: Self-pay | Admitting: Internal Medicine

## 2021-04-02 LAB — CBC WITH DIFFERENTIAL/PLATELET
Abs Immature Granulocytes: 0.03 10*3/uL (ref 0.00–0.07)
Basophils Absolute: 0 10*3/uL (ref 0.0–0.1)
Basophils Relative: 1 %
Eosinophils Absolute: 0.1 10*3/uL (ref 0.0–0.5)
Eosinophils Relative: 1 %
HCT: 40.8 % (ref 36.0–46.0)
Hemoglobin: 13.7 g/dL (ref 12.0–15.0)
Immature Granulocytes: 1 %
Lymphocytes Relative: 34 %
Lymphs Abs: 2.1 10*3/uL (ref 0.7–4.0)
MCH: 33.2 pg (ref 26.0–34.0)
MCHC: 33.6 g/dL (ref 30.0–36.0)
MCV: 98.8 fL (ref 80.0–100.0)
Monocytes Absolute: 0.5 10*3/uL (ref 0.1–1.0)
Monocytes Relative: 9 %
Neutro Abs: 3.4 10*3/uL (ref 1.7–7.7)
Neutrophils Relative %: 54 %
Platelets: 110 10*3/uL — ABNORMAL LOW (ref 150–400)
RBC: 4.13 MIL/uL (ref 3.87–5.11)
RDW: 13.9 % (ref 11.5–15.5)
WBC: 6.1 10*3/uL (ref 4.0–10.5)
nRBC: 0 % (ref 0.0–0.2)

## 2021-04-02 LAB — BASIC METABOLIC PANEL
Anion gap: 11 (ref 5–15)
BUN: 18 mg/dL (ref 8–23)
CO2: 26 mmol/L (ref 22–32)
Calcium: 9.2 mg/dL (ref 8.9–10.3)
Chloride: 97 mmol/L — ABNORMAL LOW (ref 98–111)
Creatinine, Ser: 1.13 mg/dL — ABNORMAL HIGH (ref 0.44–1.00)
GFR, Estimated: 49 mL/min — ABNORMAL LOW (ref >60)
Glucose, Bld: 90 mg/dL (ref 70–99)
Potassium: 4.4 mmol/L (ref 3.5–5.1)
Sodium: 134 mmol/L — ABNORMAL LOW (ref 135–145)

## 2021-04-02 LAB — URINE CULTURE: Culture: >=100000 — AB

## 2021-04-02 LAB — CYTOLOGY - NON PAP

## 2021-04-02 MED ORDER — MAGNESIUM SULFATE 2 GM/50ML IV SOLN
2.0000 g | Freq: Once | INTRAVENOUS | Status: AC
  Administered 2021-04-02: 2 g via INTRAVENOUS
  Filled 2021-04-02: qty 50

## 2021-04-02 MED ORDER — CEPHALEXIN 500 MG PO CAPS
500.0000 mg | ORAL_CAPSULE | Freq: Two times a day (BID) | ORAL | Status: AC
  Administered 2021-04-02 – 2021-04-03 (×4): 500 mg via ORAL
  Filled 2021-04-02 (×4): qty 1

## 2021-04-02 NOTE — TOC Progression Note (Signed)
Transition of Care (TOC) - Progression Note  ? ? ?Patient Details  ?Name: Nicole Mercado ?MRN: [id number] ?Date of Birth: [birth year] ? ?Transition of Care (TOC) CM/SW Contact  ?Pollie Friar, RN ?Phone Number: ?04/02/2021, 11:06 AM ? ?Clinical Narrative:    ?CM has sent the bed offers to patients son via email per request. TOC following. ? ? ?Expected Discharge Plan: [place] ?Barriers to Discharge: Continued Medical Work up ? ?Expected Discharge Plan and Services ?Expected Discharge Plan: [place] ?  ?  ?  ?Living arrangements for the past 2 months: Apartment ?                ?  ?  ?  ?  ?  ?  ?  ?  ?  ?  ? ? ?Social Determinants of Health (SDOH) Interventions ?  ? ?Readmission Risk Interventions ?   ? View : No data to display.  ?  ?  ?  ? ? ?

## 2021-04-02 NOTE — Evaluation (Signed)
Speech Language Pathology Evaluation ?Patient Details ?Name: Nicole Mercado ?MRN: [id number] ?DOB: 1940/04/05 ?Today's Date: 04/02/2021 ?Time: 5397-6734 ?SLP Time Calculation (min) (ACUTE ONLY): 20 min ? ?Problem List:  ?Patient Active Problem List  ? Diagnosis Date Noted  ? TIA (transient ischemic attack) 03/30/2021  ? Elevated LFTs 03/30/2021  ? Hyponatremia 03/30/2021  ? Atrial fibrillation with RVR ([place]) 03/19/2021  ? PAD (peripheral artery disease) ([place]) 10/19/2020  ? CKD (chronic kidney disease), stage III ([place]) 03/25/2019  ? History of basal cell cancer 02/11/2019  ? PAC (premature atrial contraction) 01/14/2019  ? Goals of care, counseling/discussion 12/08/2018  ? Skin lesion 12/18/2016  ? Hypercalcemia 06/19/2016  ? Drug-induced neutropenia ([place]) 05/27/2016  ? Hematuria, gross 08/28/2015  ? Easy bruising 05/31/2015  ? Encounter for chemotherapy management 11/03/2014  ? Essential hypertension 10/30/2014  ? 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 S/P AVR 01/17/2014  ? CAD (coronary artery disease), native coronary artery 01/16/2014  ? Aortic stenosis 01/11/2014  ? Arrhythmia 01/11/2014  ? Syncope and collapse 01/11/2014  ? Faintness   ? Aftercare following surgery of the circulatory system, [place] 08/18/2013  ? Occlusion and stenosis of carotid artery without mention of cerebral infarction 06/03/2011  ? CLL (chronic lymphocytic leukemia) ([place]) 03/21/2011  ? Cardiac murmur 03/21/2011  ? Carotid artery stenosis 03/21/2011  ? Thrombocytopenia ([place]) 11/19/2010  ? Lymphadenopathy 11/11/2010  ? ?Past Medical History:  ?Past Medical History:  ?Diagnosis Date  ? Aortic stenosis   ? a. Echocardiogram (01/11/14):  Mild LVH, EF 60-65%, Gr 1 DD, possible bicuspid AV, severe AS (mean 61 mmHg, peak 104 mmHg), mild MVP of post leaflet, mild MR, PASP 33 mmHg.;  b. 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 Arthritis   ? Atrial fibrillation ([place])   ? post op after CABG+AVR >> Amiodarone  ? CAD (coronary artery disease)   ? a. LHC (01/13/14):   dLM 70 extending into oLAD, mLAD 40-50, pD1 80, pD2 70, ostial/prox OM1 70 >> CABG (L-LAD, S-OM1, S-D1, S-D2)  // Myoview 10/22: Fixed defect anterolateral wall consistent with artifact, EF 71, no ischemia, low risk  ? Carotid artery occlusion   ? a. s/p L CEA 2013;  b.  Carotid US (1/16):  Bilateral ICA 1-39% // Carotid US 10/22: Bilateral ICA 1-39  ? CLL (chronic lymphocytic leukemia) ([place])   ? slow leukemia---Dr  Murinson  ? H/O exercise stress test   ? NSSTT  ? Hx of cardiovascular stress test   ? a. Nuclear stress test That (4/13): Normal perfusion, EF 74%  ? Hx of echocardiogram   ? Echo (2/16):  Mild LVH, EF 60-65%, no RWMA, Gr 2 DD, AVR ok (mean 9 mmHg), trivial AI, mild MR, mild LAE, PASP 40 mmHg  ? Hypertension   ? PAD (peripheral artery disease) ([place])   ? LE Arterial US 10/22: R - pCFA, oSFA, dSFA 30-49; L - pCFA + mSFA 50-74, pPop 30-49 >> referred to [facility]  ? Pancreatitis   ? h/o  ? Thrombocytopenia ([place]) 11/19/2010  ? ?Past Surgical History:  ?Past Surgical History:  ?Procedure Laterality Date  ? ABDOMINAL HYSTERECTOMY    ? AORTIC VALVE REPLACEMENT [name] 01/17/2014  ? Procedure: AORTIC VALVE REPLACEMENT (AVR);  Surgeon: Gaye Pollack, MD;  Location: [facility];  Service: Open Heart Surgery;  Laterality: [name];  ? APPENDECTOMY    ? CAROTID ENDARTERECTOMY  06/09/11  ? LEFT  cea  ? CESAREAN SECTION    ? FIVE  ? CHOLECYSTECTOMY    ?  CORONARY ARTERY BYPASS GRAFT [name] 01/17/2014  ? Procedure: CORONARY ARTERY BYPASS GRAFTING (CABG)TIMES FOUR USING LEFT INTERNAL MAMMARY ARTERY AND RIGHT SAPHENOUS VEIN HARVESTED ENDOSCOPICALLY;  Surgeon: Gaye Pollack, MD;  Location: [facility] OR;  Service: Open Heart Surgery;  Laterality: [name];  ? ENDARTERECTOMY  06/09/2011  ? Procedure: ENDARTERECTOMY CAROTID;  Surgeon: Rosetta Posner, MD;  [facility];  Service: Vascular;  Laterality: Left;  left carotid endarterectomy with patch angioplasty  ? LEFT AND RIGHT HEART CATHETERIZATION WITH CORONARY ANGIOGRAM [name] 01/13/2014  ? Procedure: LEFT AND RIGHT  HEART CATHETERIZATION WITH CORONARY ANGIOGRAM;  Surgeon: Jettie Booze, MD;  Location: [facility] CATH LAB;  Service: Cardiovascular;  Laterality: [name];  ? TEE WITHOUT CARDIOVERSION [name] 01/17/2014  ? Procedure: TRANSESOPHAGEAL ECHOCARDIOGRAM (TEE);  Surgeon: Gaye Pollack, MD;  Location: [facility];  Service: Open Heart Surgery;  Laterality: [name];  ? TONSILLECTOMY    ? ?HPI:  ?81 y.o. femalewas brought to the ER 03/30/21 after patient's friend noticed that patient had difficulty to speak and also her speech was not making sense. In ER pt initially had some difficulty speaking but gradually became back to baseline. CT head and CT angiogram with no acute findings Pt found to be in A-fib with RVR. BNP was elevated at 900 chest x-ray shows bilateral pleural effusion which is new. Admitted overnight for obsevarion.  PMH: CAD status post CABG and bioprosthetic aortic valve replacement, history of CLL who was recently admitted and discharged about 10 days ago for A-fib with RVR at the time patient also had a syncopal episode  ? ?Assessment / Plan / Recommendation ?Clinical Impression ? Patient presenting a moderate cognitive impairment as per this evaluation. When SLP arrived in room, patient slid way down in bed but awake and alert. She participated fully but did not spontaneously comment and did not elaborate much when responding to quesitons. She had an odd affect and had delays in responses even to basic questions. 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 Patient was oriented to place and time. She was not able to recall any therapy tasks she completed with PT and OT from earlier today and recalled only 2 of 5 words after two minute delay. Patient is not currently safe to be without 24/7 supervision (she lives at home  alone). SLP is recommending SNF level services and will follow her while here in hospital. ?   ?SLP Assessment ? SLP Recommendation/Assessment: Patient needs continued [place] Pathology Services ?SLP Visit Diagnosis: Cognitive communication deficit (R41.841)  ?  ?Recommendations for follow up therapy are one component of a multi-disciplinary discharge planning process, led by the attending physician.  Recommendations may be updated based on patient status, additional functional criteria and insurance authorization. ?   ?Follow Up Recommendations ? Skilled nursing-short term rehab (<3 hours/day)  ?  ?Assistance Recommended at Discharge ? Frequent or constant Supervision/Assistance  ?Functional Status Assessment Patient has had a recent decline in their functional status and demonstrates the ability to make significant improvements in function in a reasonable and predictable amount of time.  ?Frequency and Duration min 1 x/week  ?1 week ?  ?   ?SLP Evaluation ?Cognition ? Overall Cognitive Status: Impaired/Different from baseline ?Arousal/Alertness: Awake/alert ?Orientation Level: Oriented to person;Oriented to time;Oriented to place ?Year: 2023 ?Month: March ?Day of  Week: Correct ?Attention: Sustained ?Sustained Attention: Impaired ?Sustained Attention Impairment: Verbal complex ?Memory: Impaired ?Memory Impairment: Storage deficit;Retrieval deficit ?Awareness: Impaired ?Awareness Impairment: Intellectual impairment;Emergent impairment ?Problem Solving: Impaired ?Problem Solving Impairment: Verbal complex ?Executive Function: Initiating ?Initiating: Impaired ?Initiating Impairment: Verbal basic;Verbal complex;Functional basic ?Safety/Judgment: Impaired ?Comments: Patient not demonstrating awareness to any deficits  ?  ?   ?Comprehension ? Auditory Comprehension ?Overall Auditory Comprehension: Impaired ?Yes/No Questions:  [facility] for biographical and basic) ?Conversation: Simple ?Interfering Components:  Processing speed;Attention ?EffectiveTechniques: Extra processing time ?Visual Recognition/Discrimination ?Discrimination: Not tested ?Reading Comprehension ?Reading Status: Not tested  ?  ?Expression Verbal Expre

## 2021-04-02 NOTE — Consult Note (Addendum)
?Cardiology Consultation:  ? ?Patient ID: Nicole Mercado ?MRN: [id number]; DOB: 31-Mar-1940 ? ?Admit date: 03/30/2021 ?Date of Consult: 04/02/2021 ? ?PCP:  Nicole Cowman, MD ?  ?[facility] Providers ?Cardiologist:  Nicole Mocha, MD  ?Cardiology APP:  Nicole Shi, PA-C     ? ? ?Patient Profile:  ? ?Nicole Mercado is a 81 y.o. female with a hx of CAD with CABG and AVR in 01/2014, HTN, HLD, carotid disease with Lt CEA 2013 CLL, chronic thrombocytopenia who is being seen 04/02/2021 for the evaluation of atrial fib at the request of Dr. Karleen Mercado. ? ?History of Present Illness:  ? ?Nicole Mercado with above hx and hx of severe AS and CAD leading to CABG/bioprosthetic AVR in 2016. She had some post-op atrial fibrillation around that time. She was seen in 08/2020 for episode of syncope that occurred without warning at a grocery store. 2D echo 08/2020 EF >75% with hyperdynamic function, no RWMA, normal RV with mildly elevated PASP, mild LAE, mild MR, mild-moderate TR, mild AI, minimal change in AV gradients from 2020. Monitor 08/2020 showed NSR, possible atrial fib with very low burden of 3 hours during monitoring period (baseline artifact made interpretation difficult), no high grade heart block/pauses, occasional PVCs/supraventricular beats, no sustained ventricular arrhythmias. Given her CLL, low platelet count, and low AFib burden, the decision was made not to anticoagulate but she was referred to EP to consider ILR. Do not see this was scheduled; appt desk screen states patient refusal. The patient does not recall ever getting a call about this. She also had a carotid duplex 10/2020 (1-39% BICA with occluded RECA) as well as LE duplex (30-49% proximal CFA, ostial SFA distal SFA, 50-74% stenosis found at proximal CFA and mid SFA, 30-49% stenosis at the proximal popliteal artery) but ABIs were normal. Referral to [facility] was placed but do not see this occurred either. Stress test 10/2020 (done for the syncope) was low risk with a  defect felt c/w artifact. ? ?Pt with CABG due to distal LM stenosis  LIMA-LAD; SVG-D1; SVG-D2; SVG-OM1 2016.  Nuc study 2022 neg for ischemia.  ? ?Recently hospitalized with syncope X 2 ( 03/19/21 to 03/21/21.)  syncope thought to be due to dehydration and orthostatic hypotension.  She was found to have a fib with RVR.  Stated on IV dilt and at discharge on eliquis and po BB  plan for 30 day monitor amlodipine held.  On follow up her metoprolol increased from 75 BID to 100 BID.    Echo 03/20/21 with EF 60-65%, mildly reduced RVF with mildly elevated PASP, mild MR, moderate TR, bioprosthetic AVR with normal function.   ? ?On follow up visit scheduled for DCCV 04/22/21  ? ?Pt re-admitted 03/30/21 with slurred speech, neg CT head for bleed.  MRI of head with no acute finding Moderate chronic small vessel ischemia. Small remote cerebellar Infarcts.   ? ?PCXR with bilateral effusions -she underwent thoracentesis of Rt ling 03/31/21 removing 600 cc pl fluid. ?(On CTA of chest 03/19/21 she had milt lt pl effusion.) ?CTA head & neck: Mixed plaque in the proximal right internal carotid artery resulting in less than 50% stenosis. There is minimal plaque in the left carotid system without hemodynamically significant stenosis or occlusion. Mild stenosis at the origin of the left vertebral artery and in the V2 segment. Otherwise, patent vertebral arteries. Intracranial atherosclerotic disease resulting in mild-to-moderate stenosis of the left supraclinoid ICA, moderate stenosis of the left V4 segment, and mild stenosis of  the left P2 segment ?May have been TIA vs syncope per Neuro. ? ? ?Her Na was 122 on arrival, BNP at 900 hs troponin 15 and 18  TSH 2.503    A1C 5.3   ?With a fib RVR IV dilt was added.  Once back on BB her dilt was weaned but HR back to 123  placed back on dilt drip. ? ?She had Echo with bubble study and EF 55-60% mod LVH and no RWMA.  RV normal with mod elevated PA systolic pressure.   Mod pl effusion on lt.   Mild  MR mild AR no AS and 21 mm Nicole Mercado  ?pericardial valve is stable.   Neg bubble study.   ? ?  ?EKG:  The EKG was personally reviewed and demonstrates:  03/30/21 a fib with RVR at 142 follow up a fib rate  controlled 80s.  ?Telemetry:  Telemetry was personally reviewed and demonstrates:   atrial fib  RVR  ? ?Labs today  LDL 33, HDL 36 Tchol 96 TG 133.  ?Mg+ 1.5   ?Na 134, K+ 4.4 Cr 1.13 ?Hgb 13.7 Plts 110 WBC 6.1  ?Pl fluid no malignant cells.  ? ?BP 126/76 P 80 R 16 afebrile ?She denies chest pain or SOB though she does not remember admit to hospital.   ? ?Past Medical History:  ?Diagnosis Date  ? Aortic stenosis   ? a. Echocardiogram (01/11/14):  Mild LVH, EF 60-65%, Gr 1 DD, possible bicuspid AV, severe AS (mean 61 mmHg, peak 104 mmHg), mild MVP of post leaflet, mild MR, PASP 33 mmHg.;  b. 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 Arthritis   ? Atrial fibrillation (Nicole Mercado)   ? post op after CABG+AVR >> Amiodarone  ? CAD (coronary artery disease)   ? a. LHC (01/13/14):  dLM 70 extending into oLAD, mLAD 40-50, pD1 80, pD2 70, ostial/prox OM1 70 >> CABG (L-LAD, S-OM1, S-D1, S-D2)  // Myoview 10/22: Fixed defect anterolateral wall consistent with artifact, EF 71, no ischemia, low risk  ? Carotid artery occlusion   ? a. s/p L CEA 2013;  b.  Carotid US (1/16):  Bilateral ICA 1-39% // Carotid US 10/22: Bilateral ICA 1-39  ? CLL (chronic lymphocytic leukemia) (Nicole Mercado)   ? slow leukemia---Dr  Mercado  ? H/O exercise stress test   ? NSSTT  ? Hx of cardiovascular stress test   ? a. Nuclear stress test That (4/13): Normal perfusion, EF 74%  ? Hx of echocardiogram   ? Echo (2/16):  Mild LVH, EF 60-65%, no RWMA, Gr 2 DD, AVR ok (mean 9 mmHg), trivial AI, mild MR, mild LAE, PASP 40 mmHg  ? Hypertension   ? PAD (peripheral artery disease) (Nicole Mercado)   ? LE Arterial US 10/22: R - pCFA, oSFA, dSFA 30-49; L - pCFA + mSFA 50-74, pPop 30-49 >> referred to [facility]  ? Pancreatitis   ? h/o  ? Thrombocytopenia (Nicole Mercado) 11/19/2010  ? ? ?Past Surgical History:   ?Procedure Laterality Date  ? ABDOMINAL HYSTERECTOMY    ? AORTIC VALVE REPLACEMENT [name] 01/17/2014  ? Procedure: AORTIC VALVE REPLACEMENT (AVR);  Surgeon: Gaye Pollack, MD;  Location: [facility];  Service: Open Heart Surgery;  Laterality: [name];  ? APPENDECTOMY    ? CAROTID ENDARTERECTOMY  06/09/11  ? LEFT  cea  ? CESAREAN SECTION    ? FIVE  ? CHOLECYSTECTOMY    ? CORONARY ARTERY BYPASS GRAFT [name] 01/17/2014  ? Procedure: CORONARY ARTERY BYPASS GRAFTING (CABG)TIMES FOUR  USING LEFT INTERNAL MAMMARY ARTERY AND RIGHT SAPHENOUS VEIN HARVESTED ENDOSCOPICALLY;  Surgeon: Gaye Pollack, MD;  Location: [place] [facility] OR;  Service: Open Heart Surgery;  Laterality: [name];  ? ENDARTERECTOMY  06/09/2011  ? Procedure: ENDARTERECTOMY CAROTID;  Surgeon: Rosetta Posner, MD;  Location: [facility] OR;  Service: Vascular;  Laterality: Left;  left carotid endarterectomy with patch angioplasty  ? LEFT AND RIGHT HEART CATHETERIZATION WITH CORONARY ANGIOGRAM [name] 01/13/2014  ? Procedure: LEFT AND RIGHT HEART CATHETERIZATION WITH CORONARY ANGIOGRAM;  Surgeon: Jettie Booze, MD;  Location: [facility] CATH LAB;  Service: Cardiovascular;  Laterality: [name];  ? TEE WITHOUT CARDIOVERSION [name] 01/17/2014  ? Procedure: TRANSESOPHAGEAL ECHOCARDIOGRAM (TEE);  Surgeon: Gaye Pollack, MD;  Location: [place];  Service: Open Heart Surgery;  Laterality: [name];  ? TONSILLECTOMY    ?  ? ?Home Medications:  ?Prior to Admission medications   ?Medication Sig Start Date End Date Taking? Authorizing Provider  ?apixaban (ELIQUIS) 2.5 MG TABS tablet Take 1 tablet (2.5 mg total) by mouth 2 (two) times daily. 03/21/21  Yes Allie Bossier, MD  ?aspirin 81 MG tablet Take 81 mg by mouth daily.   Yes [provider]  ?atorvastatin (LIPITOR) 10 MG tablet TAKE 1 TABLET BY MOUTH ONCE DAILY AT  6  IN  THE  EVENING ?Patient taking differently: Take 10 mg by mouth daily. 12/28/20  Yes Nicole Mocha, MD  ?Calcium Carb-Cholecalciferol 500-125 MG-UNIT TABS Take 1 tablet by mouth daily.   Yes [provider]  ?metoprolol tartrate (LOPRESSOR) 100 MG tablet Take 1 tablet (100 mg total) by mouth 2 (two) times daily. 03/25/21  Yes Jettie Booze, MD  ?Multiple Vitamins-Minerals (HM MULTIVITAMIN ADULT GUMM

## 2021-04-02 NOTE — Progress Notes (Signed)
Physical Therapy Treatment ?Patient Details ?Name: Nicole Mercado ?MRN: [id number] ?DOB: Aug 08, 1940 ?Today's Date: 04/02/2021 ? ? ?History of Present Illness 81 y.o. femalewas brought to the ER 03/30/21 after patient's friend noticed that patient had difficulty to speak and also her speech was not making sense. In ER pt initially had some difficulty speaking but gradually became back to baseline. CT head and CT angiogram with no acute findings Pt found to be in A-fib with RVR. BNP was elevated at 900 chest x-ray shows bilateral pleural effusion which is new. Admitted overnight for obsevarion.  PMH: CAD status post CABG and bioprosthetic aortic valve replacement, history of CLL who was recently admitted and discharged about 10 days ago for A-fib with RVR at the time patient also had a syncopal episode ? ?  ?PT Comments  ? ? Pt more alert and interactive today. Pt continues to demo both cognitive and functional deficits inhibiting safe d/c home alone. Pt at increased falls risk and benefits from use of RW. Pt needs 24/7 supervision due to decreased awareness to safety and deficits. Acute PT to cont to follow to progress mobility. ?   ?Recommendations for follow up therapy are one component of a multi-disciplinary discharge planning process, led by the attending physician.  Recommendations may be updated based on patient status, additional functional criteria and insurance authorization. ? ?Follow Up Recommendations ? Skilled nursing-short term rehab (<3 hours/day) ?  ?  ?Assistance Recommended at Discharge Frequent or constant Supervision/Assistance  ?Patient can return home with the following A little help with walking and/or transfers;A little help with bathing/dressing/bathroom;Direct supervision/assist for medications management;Direct supervision/assist for financial management;Assist for transportation ?  ?Equipment Recommendations ? Rolling walker (2 wheels) (youth)  ?  ?Recommendations for Other Services   ? ? ?   ?Precautions / Restrictions Precautions ?Precautions: Fall ?Precaution Comments: hx of falling ?Restrictions ?Weight Bearing Restrictions: No  ?  ? ?Mobility ? Bed Mobility ?Overal bed mobility: Needs Assistance ?Bed Mobility: Supine to Sit ?  ?  ?Supine to sit: Mod assist ?  ?  ?General bed mobility comments: modA today due to having a elbow immobilizer on to help keep arm straight for IV salvage, pt unable to use R UE to assist with transfer to EOB ?  ? ?Transfers ?Overall transfer level: Needs assistance ?Equipment used: Rolling walker (2 wheels) ?Transfers: Sit to/from Stand ?Sit to Stand: Min guard ?  ?  ?  ?  ?  ?General transfer comment: good power up however mildly unsteady upon stand, verbal cues for safe hand placement ?  ? ?Ambulation/Gait ?Ambulation/Gait assistance: Min assist ?Gait Distance (Feet): 150 Feet ?Assistive device: None, Rolling walker (2 wheels) ?Gait Pattern/deviations: Step-through pattern, Decreased step length - right, Decreased step length - left, Shuffle, Staggering left, Staggering right ?Gait velocity: slowed ?Gait velocity interpretation: <1.31 ft/sec, indicative of household ambulator ?  ?General Gait Details: pt initially amb without AD, pt with very narrow base of support and several episodes of LOB with cross over gait to catch self requiring minA to prevent falling. pt then given RW. pt significantly more steady, verbal cues required to stay in walker and for safe walker management when turning. ? ? ?Stairs ?  ?  ?  ?  ?  ? ? ?Wheelchair Mobility ?  ? ?Modified Rankin (Stroke Patients Only) ?Modified Rankin (Stroke Patients Only) ?Pre-Morbid Rankin Score: Slight disability ?Modified Rankin: Moderately severe disability ? ? ?  ?Balance Overall balance assessment: Needs assistance ?Sitting-balance support: No upper extremity  supported, Feet supported, Feet unsupported ?Sitting balance-Leahy Scale: Good ?  ?  ?Standing balance support: No upper extremity supported, Single  extremity supported, Bilateral upper extremity supported, During functional activity ?Standing balance-Leahy Scale: Fair ?Standing balance comment: does better with UE support during dynamic activity ?  ?  ?  ?  ?  ?  ?  ?  ?  ?  ?  ?  ? ?  ?Cognition Arousal/Alertness: Awake/alert ?Behavior During Therapy: Flat affect ?Overall Cognitive Status: Impaired/Different from baseline ?Area of Impairment: Memory, Following commands, Safety/judgement, Awareness, Problem solving, Attention ?  ?  ?  ?  ?  ?  ?  ?  ?  ?Current Attention Level: Sustained ?Memory: Decreased short-term memory ?Following Commands: Follows one step commands consistently ?Safety/Judgement: Decreased awareness of deficits, Decreased awareness of safety ?Awareness: Emergent ?Problem Solving: Requires verbal cues, Requires tactile cues, Difficulty sequencing, Slow processing ?General Comments: pt flat, mild confusion/comprehension. stated it was the 27th of February and 2000 something. Pt able to recall date later in session. Pt with decreased insight to safety stating she walks better without RW however was significantly more unsteady than with the RW ?  ?  ? ?  ?Exercises   ? ?  ?General Comments General comments (skin integrity, edema, etc.): orthostatic BP, see RN note, RN completed prior to PT session, pt denies dizziness despite drop in bp from 510 systolic to 94 systolic ?  ?  ? ?Pertinent Vitals/Pain    ? ? ?Home Living   ?  ?Available Help at Discharge: Friend(s);Available PRN/intermittently ?Type of Home: Apartment ?  ?  ?  ?  ?  ?  ?   ?  ?Prior Function    ?  ?  ?   ? ?PT Goals (current goals can now be found in the care plan section) Acute Rehab PT Goals ?Patient Stated Goal: go home ?PT Goal Formulation: With patient ?Time For Goal Achievement: 04/14/21 ?Potential to Achieve Goals: Fair ?Progress towards PT goals: Progressing toward goals ? ?  ?Frequency ? ? ? Min 3X/week ? ? ? ?  ?PT Plan Current plan remains appropriate   ? ? ?Co-evaluation   ?  ?  ?  ?  ? ?  ?AM-PAC PT "6 Clicks" Mobility   ?Outcome Measure ? Help needed turning from your back to your side while in a flat bed without using bedrails?: None ?Help needed moving from lying on your back to sitting on the side of a flat bed without using bedrails?: None ?Help needed moving to and from a bed to a chair (including a wheelchair)?: A Little ?Help needed standing up from a chair using your arms (e.g., wheelchair or bedside chair)?: A Little ?Help needed to walk in hospital room?: A Little ?Help needed climbing 3-5 steps with a railing? : A Lot ?6 Click Score: 19 ? ?  ?End of Session Equipment Utilized During Treatment: Gait belt ?Activity Tolerance: Patient tolerated treatment well ?Patient left: with call bell/phone within reach;in bed;in chair;with chair alarm set ?Nurse Communication: Mobility status ?PT Visit Diagnosis: Unsteadiness on feet (R26.81);Repeated falls (R29.6);Muscle weakness (generalized) (M62.81);Difficulty in walking, not elsewhere classified (R26.2) ?  ? ? ?Time: 2585-2778 ?PT Time Calculation (min) (ACUTE ONLY): 21 min ? ?Charges:  $Gait Training: 8-22 mins          ?          ? ?Kittie Plater, PT, DPT ?Acute Rehabilitation Services ?Pager #: [phone] ?Office #: [phone] ? ? ? ?Nicole Nay  M Efrat Mercado ?04/02/2021, 3:00 PM ? ?

## 2021-04-02 NOTE — Plan of Care (Signed)
°  Problem: Education: °Goal: Knowledge of disease or condition will improve °Outcome: Progressing °Goal: Knowledge of secondary prevention will improve (SELECT ALL) °Outcome: Progressing °Goal: Knowledge of patient specific risk factors will improve (INDIVIDUALIZE FOR PATIENT) °Outcome: Progressing °Goal: Individualized Educational Video(s) °Outcome: Progressing °  °Problem: Coping: °Goal: Will verbalize positive feelings about self °Outcome: Progressing °Goal: Will identify appropriate support needs °Outcome: Progressing °  °Problem: Health Behavior/Discharge Planning: °Goal: Ability to manage health-related needs will improve °Outcome: Progressing °  °Problem: Self-Care: °Goal: Ability to participate in self-care as condition permits will improve °Outcome: Progressing °Goal: Verbalization of feelings and concerns over difficulty with self-care will improve °Outcome: Progressing °Goal: Ability to communicate needs accurately will improve °Outcome: Progressing °  °Problem: Nutrition: °Goal: Risk of aspiration will decrease °Outcome: Progressing °Goal: Dietary intake will improve °Outcome: Progressing °  °Problem: Ischemic Stroke/TIA Tissue Perfusion: °Goal: Complications of ischemic stroke/TIA will be minimized °Outcome: Progressing °  °Problem: Education: °Goal: Knowledge of General Education information will improve °Description: Including pain rating scale, medication(s)/side effects and non-pharmacologic comfort measures °Outcome: Progressing °  °Problem: Health Behavior/Discharge Planning: °Goal: Ability to manage health-related needs will improve °Outcome: Progressing °  °Problem: Clinical Measurements: °Goal: Ability to maintain clinical measurements within normal limits will improve °Outcome: Progressing °Goal: Will remain free from infection °Outcome: Progressing °Goal: Diagnostic test results will improve °Outcome: Progressing °Goal: Respiratory complications will improve °Outcome: Progressing °Goal:  Cardiovascular complication will be avoided °Outcome: Progressing °  °Problem: Activity: °Goal: Risk for activity intolerance will decrease °Outcome: Progressing °  °Problem: Nutrition: °Goal: Adequate nutrition will be maintained °Outcome: Progressing °  °Problem: Coping: °Goal: Level of anxiety will decrease °Outcome: Progressing °  °Problem: Elimination: °Goal: Will not experience complications related to bowel motility °Outcome: Progressing °Goal: Will not experience complications related to urinary retention °Outcome: Progressing °  °Problem: Pain Managment: °Goal: General experience of comfort will improve °Outcome: Progressing °  °Problem: Safety: °Goal: Ability to remain free from injury will improve °Outcome: Progressing °  °Problem: Skin Integrity: °Goal: Risk for impaired skin integrity will decrease °Outcome: Progressing °  °

## 2021-04-02 NOTE — Progress Notes (Addendum)
? ? ? [facility] ?                                                                            ? ? ?Nicole Mercado, is a 81 y.o. female, DOB - Nov 29, 1940, GUY:[id number] ?Admit date - 03/30/2021    ?Outpatient Primary MD for the patient is Kristie Cowman, MD ? ?LOS - 2  days ? ? ? ?Brief summary  ? ?Nicole Mercado is a 81 y.o. 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 ? ?CT head did not show anything acute and this was followed by CT angiogram of the head and neck which did not show any large vessel obstruction. Neurology signed off.   Patient also was in A-fib with RVR was started on Cardizem infusion. Cardiology consulted as pt 's grand daughter wants to know if ablation can be done this admission.  ? ? ?Assessment & Plan  ? ? ?Assessment and Plan: ? ?Difficulty speech/syncope: ?So far work-up has been negative for an acute stroke. Possibly from UTI.  ?CT head did not show anything acute and this was followed by CT angiogram of the head and neck which did not show any large vessel obstruction. Neurology on board and signed off. ?MRI brain shows Moderate chronic small vessel ischemia. 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 ?Continue with aspirin. ?Hemoglobin A1c is 5.3% ?LDL IS 33.  ?Therapy evaluations recommending SNF. Toc Aware. ? ? ?Atrial fibrillation with RVR on Cardizem gtt. ?She was weaned off cardizem on 3/27 but her rate is back in 120 to 130/min with PT on 3/28 , we will put her back on cardizem gtt, and requested Cardiology consult.  ?Continue with metoprolol 100 mg BID.  ?Echocardiogram with bubble study showed LVEF of 55 to 60%, normal function, no regional wall abn, mode LV hypertrophy.  Diastolic parameters cannot be evaluated. Bubble study negative no evidence of interatrial shunt.  ?On eliquis for anti coagulation.  ?[place] Daughter informs that she is scheduled for ablation next month, wanted to know if it can be done this admission.  ?Will wait for cardiology recommendations.  ? ? ?CAD s/p CABG; ?No chest pain or sob today.   ? ? ?H/o CLL;  ?Outpatient follow up with PCP.  ? ? ? ?E coli UTI.  ?urine cultures are pending they show 1,00,000 E coli. Currently on rocephin. Transition to keflex today to complete the course.  ? ? ?Hyponatremia:  ?Possibly from fluid overload. Improving.  ?Sodium back to baseline today.  ? ? ?Elevated liver enzymes possibly from congestion from CHF.  ?US abdomen shows fatty liver disease.  ?Acute Hepatitis inf panel is negative.  ?She denies any nausea, vomiting or abdominal pain.  ? ? ?Chronic diastolic heart failure : ?Elevated BNP.  ? ? ?Large bilateral pleural effusions:  ?Suspect from CHF. ?IR consulted for thoracentesis and 600 ml of pleural fluid aspirated.  ?Fluid sent  for analysis and pending. Gram stain shows mononuclear WBC's ?Cytology shows no malignant cells. mesothelial cells are present.  ? ? ?Hypokalemia  ?Replaced.  ? ? ?Mild leukocytosis:  ?Resolved.  ? ? ?AKI;  ?Creatinine worsened with one dose of lasix, lasix held.  ? Creatinine Improving.  ? ? ?  ? ?Estimated body mass index is 18.38 kg/m? as calculated from the following: ?  Height as of this encounter: 5' (1.524 m). ?  Weight as of this encounter: 42.7 kg. ? ?Code Status: full code.  ?DVT Prophylaxis:  apixaban (ELIQUIS) tablet 2.5 mg Start: 04/01/21 1030 ?apixaban (ELIQUIS) tablet 2.5 mg  ? ?Level of Care: Level of care: Progressive ?Family Communication: none at bedside.  ? ?Disposition Plan:     Remains inpatient appropriate: rate control with IV Cardizem.  ? ?Procedures:  ?Echocardiogram.  ? ?Consultants:   ?Neurology.  ? ?Antimicrobials:  ? ?Anti-infectives (From admission, onward)  ? ?  Start     Dose/Rate Route Frequency Ordered Stop  ? 03/30/21 2245  cefTRIAXone (ROCEPHIN) 1 g in sodium chloride 0.9 % 100 mL IVPB       ? 1 g ?200 mL/hr over 30 Minutes Intravenous Every 24 hours 03/30/21 2230    ? ?  ? ? ? ?Medications ? ?Scheduled Meds: ? apixaban  2.5 mg Oral BID  ? aspirin  81 mg Oral Daily  ? metoprolol tartrate  100 mg Oral BID  ? ?Continuous Infusions: ? cefTRIAXone (ROCEPHIN)  IV 1 g (04/01/21 2231)  ? diltiazem (CARDIZEM) infusion 7.5 mg/hr (04/02/21 1126)  ? ?PRN Meds:. ? ? ? ?Subjective:  ? ?Toryn Dewalt was seen and examined today.  No chest pain or sob. No nausea, vomiting. Wants to [facility] when she can go home.  ? ?Objective:  ? ?Vitals:  ? 04/01/21 2035 04/01/21 2300 04/02/21 0345 04/02/21 0746  ?BP: 133/79 (!) 149/62 139/85 120/66  ?Pulse: 96 80 93 97  ?Resp:    18  ?Temp: 98.1 ?F (36.7 ?C) 98.1 ?F (36.7 ?C) 98.2 ?F (36.8 ?C) 98 ?F (36.7 ?C)  ?TempSrc: Oral Oral Oral Oral  ?SpO2: 99% 99%  100%  ?Weight:      ?Height:      ? ? ?Intake/Output Summary (Last 24 hours) at 04/02/2021 1130 ?Last data filed at 04/02/2021 3875 ?Gross per 24 hour  ?Intake 395.03 ml  ?Output --  ?Net 395.03 ml  ? ? ?Filed Weights  ? 03/31/21 0200 04/01/21 0316  ?Weight: 44.2 kg 42.7 kg  ? ? ? ?Exam ?General exam: Appears calm and comfortable  ?Respiratory system: Clear to auscultation. Respiratory effort normal. ?Cardiovascular system: S1 & S2 heard, irregularly irregular  No JVD,  No pedal edema. ?Gastrointestinal system: Abdomen is nondistended, soft and nontender.  Normal bowel sounds heard. ?Central nervous system: Alert and oriented to person and place.  ?Extremities: Symmetric 5 x 5 power. ?Skin: No rashes, ?Psychiatry:  Mood & affect appropriate.  ? ? ? ? ?Data Reviewed:  I have personally reviewed following labs and imaging studies ? ? ?CBC ?Lab Results  ?Component Value Date  ? WBC 6.1 04/02/2021  ? RBC 4.13 04/02/2021  ? HGB 13.7 04/02/2021  ? HCT 40.8 04/02/2021  ? MCV 98.8 04/02/2021  ? MCH 33.2  04/02/2021  ? PLT 110 (L) 04/02/2021  ? MCHC 33.6 04/02/2021  ? RDW 13.9 04/02/2021  ? LYMPHSABS 2.1 04/02/2021  ? MONOABS 0.5 04/02/2021  ? EOSABS 0.1 04/02/2021  ? BASOSABS 0.0 04/02/2021  ? ? ? ?Last  metabolic panel ?Lab Results  ?Component Value Date  ? NA 134 (L) 04/02/2021  ? K 4.4 04/02/2021  ? CL 97 (L) 04/02/2021  ? CO2 26 04/02/2021  ? BUN 18 04/02/2021  ? CREATININE 1.13 (H) 04/02/2021  ? GLUCOSE 90 04/02/2021  ? GFRNONAA 49 (L) 04/02/2021  ? GFRAA 40 (L) 06/17/2019  ? CALCIUM 9.2 04/02/2021  ? PHOS 3.0 03/21/2021  ? PROT 5.0 (L) 04/01/2021  ? ALBUMIN 2.9 (L) 04/01/2021  ? BILITOT 1.1 04/01/2021  ? ALKPHOS 85 04/01/2021  ? AST 61 (H) 04/01/2021  ? ALT 168 (H) 04/01/2021  ? ANIONGAP 11 04/02/2021  ? ? ?CBG (last 3)  ?No results for input(s): GLUCAP in the last 72 hours.  ? ? ?Coagulation Profile: ?Recent Labs  ?Lab 03/30/21 ?1841  ?INR 1.4*  ? ? ? ? ?Radiology Studies: ?DG Chest 1 View ? ?Result Date: 03/31/2021 ?CLINICAL DATA:  Status post thoracentesis EXAM: CHEST  1 VIEW COMPARISON:  Chest radiograph 1 day prior FINDINGS: Median sternotomy wires, mediastinal surgical clips, and aortic valve prosthesis are stable. The right pleural effusion has significantly decreased in size following thoracentesis. Is no pneumothorax. There is minimal right basilar atelectasis. The left pleural effusion is not significantly changed in size, with persistent left basilar opacities. The left upper lobe remains well-aerated. There is no appreciable left pneumothorax. The bones are stable. IMPRESSION: 1. Decreased size of the right pleural effusion following thoracentesis. No pneumothorax. 2. Stable left pleural effusion with adjacent left basilar opacity. Electronically Signed   By: Valetta Mole M.D.   On: 03/31/2021 15:11  ? ?ECHOCARDIOGRAM LIMITED BUBBLE STUDY ? ?Result Date: 04/01/2021 ?   ECHOCARDIOGRAM LIMITED REPORT   Patient Name:   ETHELDA DEANGELO Date of Exam: 04/01/2021 Medical Rec #:  [id number]     Height:       60.0  in Accession #:    [id number]    Weight:       94.1 lb Date of Birth:  11/04/1940    BSA:          1.355 m? Patient Age:    21 years      BP:           117/67 mmHg Patient Gender: F             HR:           115

## 2021-04-03 DIAGNOSIS — J90 Pleural effusion, not elsewhere classified: Secondary | ICD-10-CM | POA: Diagnosis present

## 2021-04-03 DIAGNOSIS — I4819 Other persistent atrial fibrillation: Secondary | ICD-10-CM

## 2021-04-03 DIAGNOSIS — R5381 Other malaise: Secondary | ICD-10-CM | POA: Diagnosis present

## 2021-04-03 DIAGNOSIS — N390 Urinary tract infection, site not specified: Secondary | ICD-10-CM | POA: Diagnosis present

## 2021-04-03 NOTE — Assessment & Plan Note (Addendum)
On arrival to the ED, patient was noted to be in A-fib with RVR.  Recently diagnosed with initial plan of outpatient cardioversion.  TTE with LVEF 55-60%, normal LV function, no regional wall abnormalities, moderate LVH, diastolic primers cannot be evaluated.  TEE 3/30 with left atrial appendage clot noted and cardioversion was canceled.  Initially started on a Cardizem drip which was transitioned to Cardizem CD 180 mg p.o. daily.  Continue metoprolol tartrate 100 mg p.o. twice daily.  Eliquis 5 mg p.o. twice daily for anticoagulation.  Will need to continue anticoagulation with no attempts of cardioversion until 4 weeks after.  Outpatient follow-up with cardiology. ? ?

## 2021-04-03 NOTE — Assessment & Plan Note (Addendum)
On atorvastatin 10 mg p.o. daily at home.  ?

## 2021-04-03 NOTE — Assessment & Plan Note (Addendum)
Discharging to SNF for further rehabilitation. ?

## 2021-04-03 NOTE — Care Management Important Message (Signed)
Important Message ? ?Patient Details  ?Name: Nicole Mercado ?MRN: [id number] ?Date of Birth: 1940/06/01 ? ? ?Medicare Important Message Given:  Yes ? ? ? ? ?Erminio Nygard ?04/03/2021, 12:35 PM ?

## 2021-04-03 NOTE — Progress Notes (Signed)
Occupational Therapy Treatment ?Patient Details ?Name: Nicole Mercado ?MRN: [id number] ?DOB: [birth year] ?Today's Date: 04/03/2021 ? ? ?History of present illness 81 y.o. femalewas brought to the ER 03/30/21 after patient's friend noticed that patient had difficulty to speak and also her speech was not making sense. In ER pt initially had some difficulty speaking but gradually became back to baseline. CT head and CT angiogram with no acute findings Pt found to be in A-fib with RVR. BNP was elevated at 900 chest x-ray shows bilateral pleural effusion which is new. Admitted overnight for obsevarion.  PMH: CAD status post CABG and bioprosthetic aortic valve replacement, history of CLL who was recently admitted and discharged about 10 days ago for A-fib with RVR at the time patient also had a syncopal episode ?  ?OT comments ? Patient received in bed and agreeable to OT treatment. Patient's BP checked once on EOB at 95/69 with patient having no complaints of dizziness. Patient asked to walk to sink without RW and required min assist for balance but ws able to stand at sink with min guard for grooming tasks. Patient more steady with RW to recliner. Acute OT to continue to follow.   ? ?Recommendations for follow up therapy are one component of a multi-disciplinary discharge planning process, led by the attending physician.  Recommendations may be updated based on patient status, additional functional criteria and insurance authorization. ?   ?Follow Up Recommendations ? Skilled nursing-short term rehab (<3 hours/day)  ?  ?Assistance Recommended at Discharge Frequent or constant Supervision/Assistance  ?Patient can return home with the following ? A little help with walking and/or transfers;A little help with bathing/dressing/bathroom;Assistance with cooking/housework;Assist for transportation;Help with stairs or ramp for entrance;Direct supervision/assist for medications management;Direct supervision/assist for financial  management ?  ?Equipment Recommendations ? Other (comment) (RW)  ?  ?Recommendations for Other Services   ? ?  ?Precautions / Restrictions Precautions ?Precautions: Fall ?Precaution Comments: hx of falling ?Restrictions ?Weight Bearing Restrictions: No  ? ? ?  ? ?Mobility Bed Mobility ?Overal bed mobility: Needs Assistance ?Bed Mobility: Supine to Sit ?  ?  ?Supine to sit: Mod assist ?  ?  ?General bed mobility comments: due to elbow immobilizer on RUE ?  ? ?Transfers ?Overall transfer level: Needs assistance ?Equipment used: Rolling walker (2 wheels), None ?Transfers: Sit to/from Stand ?Sit to Stand: Min guard ?  ?  ?  ?  ?  ?General transfer comment: min assist to ambulate from EOB to sink without RW and min guard with RW ?  ?  ?Balance Overall balance assessment: Needs assistance ?Sitting-balance support: No upper extremity supported, Feet supported, Feet unsupported ?Sitting balance-Leahy Scale: Good ?  ?  ?Standing balance support: No upper extremity supported, Single extremity supported, Bilateral upper extremity supported, During functional activity ?Standing balance-Leahy Scale: Fair ?Standing balance comment: stood at sink for grooming with sink for support ?  ?  ?  ?  ?  ?  ?  ?  ?  ?  ?  ?   ? ?ADL either performed or assessed with clinical judgement  ? ?ADL Overall ADL's : Needs assistance/impaired ?  ?  ?Grooming: Wash/dry hands;Wash/dry face;Oral care;Brushing hair;Min guard;Standing ?Grooming Details (indicate cue type and reason): at sink ?  ?  ?Lower Body Bathing: Supervison/ safety;Sitting/lateral leans ?Lower Body Bathing Details (indicate cue type and reason): bathed feet seated ?  ?  ?Lower Body Dressing: Supervision/safety;Sitting/lateral leans ?Lower Body Dressing Details (indicate cue type and reason): changed socks ?  ?  ?  ?  ?  ?  ?  ?  General ADL Comments: able to perform 3 grooming tasks standing at sink before requiring seated break ?  ? ?Extremity/Trunk Assessment   ?  ?  ?  ?  ?   ? ?Vision   ?  ?  ?Perception   ?  ?Praxis   ?  ? ?Cognition Arousal/Alertness: Awake/alert ?Behavior During Therapy: Flat affect ?Overall Cognitive Status: Impaired/Different from baseline ?Area of Impairment: Memory, Following commands, Safety/judgement, Awareness, Problem solving, Attention ?  ?  ?  ?  ?  ?  ?  ?  ?  ?Current Attention Level: Sustained ?Memory: Decreased short-term memory ?Following Commands: Follows one step commands consistently ?Safety/Judgement: Decreased awareness of deficits, Decreased awareness of safety ?Awareness: Emergent ?Problem Solving: Requires verbal cues, Requires tactile cues, Difficulty sequencing, Slow processing ?General Comments: cues for safety ?  ?  ?   ?Exercises   ? ?  ?Shoulder Instructions   ? ? ?  ?General Comments BP at sitting on EOB from supine 95/69 with patient stating no dizziness. BP at end of session seated in recliner 129/75  ? ? ?Pertinent Vitals/ Pain       Pain Assessment ?Pain Assessment: No/denies pain ? ?Home Living   ?  ?  ?  ?  ?  ?  ?  ?  ?  ?  ?  ?  ?  ?  ?  ?  ?  ?  ? ?  ?Prior Functioning/Environment    ?  ?  ?  ?   ? ?Frequency ? Min 2X/week  ? ? ? ? ?  ?Progress Toward Goals ? ?OT Goals(current goals can now be found in the care plan section) ? Progress towards OT goals: Progressing toward goals ? ?Acute Rehab OT Goals ?Patient Stated Goal: get better ?OT Goal Formulation: With patient ?Time For Goal Achievement: 04/14/21 ?Potential to Achieve Goals: Good ?ADL Goals ?Pt Will Perform Grooming: Independently;standing ?Pt Will Transfer to Toilet: with modified independence;ambulating ?Pt Will Perform Toileting - Clothing Manipulation and hygiene: Independently;sitting/lateral leans ?Additional ADL Goal #1: Pt will indep carryout a 3 step directional task to improve safety durign ADLs ?Additional ADL Goal #2: pt will demonstrated improved activty tolerance to complete at least 3 grooming tasks in standing with supervision A  ?Plan Discharge plan  needs to be updated;Frequency remains appropriate   ? ?Co-evaluation ? ? ?   ?  ?  ?  ?  ? ?  ?AM-PAC OT "6 Clicks" Daily Activity     ?Outcome Measure ? ? Help from another person eating meals?: None ?Help from another person taking care of personal grooming?: A Little ?Help from another person toileting, which includes using toliet, bedpan, or urinal?: A Little ?Help from another person bathing (including washing, rinsing, drying)?: A Little ?Help from another person to put on and taking off regular upper body clothing?: A Little ?Help from another person to put on and taking off regular lower body clothing?: A Little ?6 Click Score: 19 ? ?  ?End of Session Equipment Utilized During Treatment: Gait belt;Rolling walker (2 wheels) ? ?OT Visit Diagnosis: Unsteadiness on feet (R26.81);Other abnormalities of gait and mobility (R26.89);Muscle weakness (generalized) (M62.81);Other symptoms and signs involving cognitive function ?  ?Activity Tolerance Patient tolerated treatment well ?  ?Patient Left in chair;with call bell/phone within reach;with chair alarm set ?  ?Nurse Communication Mobility status;Other (comment) (BP readings) ?  ? ?   ? ?Time: 8891-6945 ?OT Time Calculation (min): 25 min ? ?Charges: OT General Charges ?$OT  Visit: 1 Visit ?OT Treatments ?$Self Care/Home Management : 23-37 mins ? ?Lodema Hong, OTA ?Acute Rehabilitation Services  ?Pager [phone] ?Office [phone] ? ? ?[place] ?04/03/2021, 11:19 AM ?

## 2021-04-03 NOTE — Assessment & Plan Note (Addendum)
Metoprolol tartrate '100mg'$  p.o. twice daily, Cardizem CD 180 mg p.o. daily. ?

## 2021-04-03 NOTE — Progress Notes (Signed)
?PROGRESS NOTE ? ? ? ?Nicole Mercado  ZOX:[id number] DOB: 1940/06/13 DOA: 03/30/2021 ?PCP: Nicole Cowman, MD  ? ? 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  Noted by friend around 5 PM on date of admission that she was slurring her words and not making any sense.  Last known normal was roughly 4 PM. ? ?In the ED, temperature 97.6 ?F, BP 165/90, RR 13, SPO2 93% on room air.  Sodium 122, potassium 5.1, chloride 87, CO2 24, glucose 118, BUN 24, creatinine 0.98, AST 185, ALT 338, total bilirubin 1.4.  WBC 12.8, hemoglobin 13.7, platelets 135.  INR 1.4.  COVID-19 PCR negative.  Influenza A/B PCR negative.  Urinalysis with moderate leukocytes, positive nitrite, many bacteria, greater than 50 WBCs.  Patient initially had some difficulty speaking, but gradually improved back to her baseline.  CT head without contrast negative for acute findings.  CTA head/neck negative for large vessel occlusion.  Neurology was consulted.  Patient was also noted to be in A-fib with RVR and started on a Cardizem drip.  Hospitalist service consulted for further evaluation and management of slurred speech and A-fib with RVR.  Patient was transferred to [facility] for further evaluation and treatment.  ? ? ?Assessment & Plan: ?  ?Assessment and Plan: ?* Persistent atrial fibrillation with RVR ([place]) ?On arrival to the ED, patient was noted to be in A-fib with RVR.  Recently diagnosed with initial plan of outpatient cardioversion.  TTE with LVEF 55-60%, normal LV function, no regional wall abnormalities, moderate LVH, diastolic primers cannot be evaluated. ?--Cardiology following, appreciate assistance ?--Metoprolol tartrate 100 mg p.o. twice daily ?--Cardizem  drip ?--Eliquis 2.5 mg p.o. twice daily ?--Continue to monitor on telemetry ?--TEE/DCCV planned for 3/30; NPO after MN ? ? ?TIA (transient ischemic attack) ?Patient initially presenting with slurred speech/aphasia.  Resolved in the ED.  Neurology was consulted and followed during initial hospital course.  CT head without contrast with no acute hemorrhage/infarct, mild global parenchymal volume loss and moderate chronic white matter microangiopathy, small remote lacunar infarcts right basal ganglia and left insula.  MRI head no acute infarct with moderate chronic small vessel ischemia, small remote cerebellar infarct.  CTA head/neck with mixed plaque proximal right internal carotid artery resulting in less than 50% stenosis, minimal plaque left carotid system, mild stenosis origin left vertebral artery and V2 segment, otherwise patent vertebral arteries, intracranial atherosclerotic disease resulting in mild/moderate stenosis left supraclinoid ICA.  TTE with LVEF 60 to 65%, 21 mm [facility] Ease valve present in the aortic position, no intra-atrial shunt noted.  LDL 33, hemoglobin A1c 5.3.  Neurology unclear if this was TIA versus syncopal event from A-fib with RVR.  Neurology now signed off.  Aspirin 81 mg p.o. daily.  Outpatient follow-up stroke clinic 4-6 weeks. ? ?Essential hypertension ?--Metoprolol tartrate '100mg'$  p.o. twice daily ?--Cardizem drip as above ? ?S/P AVR ?TTE with 21 mm [facility] Ease pericardial valve present in the aortic position.  Continue outpatient follow-up with cardiology. ? ?CKD (chronic kidney disease) stage 3, GFR 30-59 ml/min (HCC) ?Creatinine 0.9-1.1.,  Stable ?--Avoid nephrotoxins, renally dose all medications ? ?CLL (chronic lymphocytic leukemia) ([place]) ?Follows with medical oncology outpatient, Nicole Mercado. On treatment with [facility].  Continue outpatient follow-up. ? ?  PAD (peripheral artery disease) ([place]) ?On atorvastatin 10 mg p.o. daily at home.  Currently holding due to  elevated LFTs on admission.  LDL 33. ?--Likely resume atorvastatin on discharge if LFTs continue to trend down/normalized ? ?E-coli UTI ?Urine culture with greater than 100 K E. coli.  Initially started on Rocephin, now transition to Keflex. ?--Keflex '500mg'$  PO BID, plan 5-day total antibiotic course ? ?Pleural effusion ?Chest x-ray with bilateral pleural effusions.  Patient underwent ultrasound-guided right thoracentesis by IR on 03/31/2020 yielding 600 cc of yellow fluid.  No malignant cells on cytology.  No organisms seen on Gram stain. ?--Pleural fluid culture: No growth x3 days ?--Oxygenating well on room air ? ?Physical deconditioning ?--Continue PT/OT efforts while inpatient ?--Pending SNF placement ? ?Hyponatremia ?Sodium 122 on admission.  Likely secondary to hypovolemic hyponatremia. ?--Na 122>>>134 ?--BMP in the a.m. ? ?Elevated LFTs ?Etiology likely secondary to mild shock liver in the setting of A-fib with RVR and likely poor perfusion.  Right upper quadrant ultrasound with mildly increased echogenicity of the liver consistent with fatty infiltration, no obvious focal liver lesions, right-sided pleural effusion, prior cholecystectomy. ?--AST 185>118>61 ?--ALT 338>251>168 ?--Tbili 1.4>1.5>1.1 ?--Holding atorvastatin ?--Repeat LFTs in a.m. ? ?Thrombocytopenia ([place]) ?Platelet count 135, stable.  Follows with hematology outpatient.  On aspirin. ? ? ? ? ? ?DVT prophylaxis: apixaban (ELIQUIS) tablet 2.5 mg Start: 04/01/21 1030 ?apixaban (ELIQUIS) tablet 2.5 mg  ?  Code Status: Full Code ?Family Communication: Patient's son Nicole Mercado and daughter-in-law Nicole Mercado via telephone this afternoon. ? ?Disposition Plan:  ?Level of care: Progressive ?Status is: Inpatient ?Remains inpatient appropriate because: Pending TEE/DCCV 3/30 ?  ? ?Consultants:  ?Neurology -signed off ?Cardiology ? ?Procedures:  ?TTE ? ?Antimicrobials:  ?Ceftriaxone 3/25 - 3/28 ?Keflex 3/28>> ? ? ?Subjective: ?Patient seen examined bedside, resting  comfortably.  Patient's close friend, Nicole Mercado present.  No complaints this morning.  Remains on Cardizem drip.  Awaiting TEE/cardioversion planned for tomorrow.  No other questions or concerns at this time.  Denies headache, no dizziness, no chest pain, no palpitations, no shortness of breath, no abdominal pain, no weakness, no fever/chills/night sweats, no nausea/vomiting/diarrhea, no paresthesias.  No acute events overnight per nursing staff. ? ?Objective: ?Vitals:  ? 04/02/21 2320 04/03/21 0413 04/03/21 0721 04/03/21 1155  ?BP: 128/75 132/78  (!) 106/55  ?Pulse: 76 84  98  ?Resp: 18 (!) 25    ?Temp: 98.8 ?F (37.1 ?C) 98.2 ?F (36.8 ?C) 98.2 ?F (36.8 ?C) 98.1 ?F (36.7 ?C)  ?TempSrc: Oral Oral Oral Oral  ?SpO2: 97% 96%    ?Weight:      ?Height:      ? ?No intake or output data in the 24 hours ending 04/03/21 1244 ?Filed Weights  ? 03/31/21 0200 04/01/21 0316  ?Weight: 44.2 kg 42.7 kg  ? ? ?Examination: ? ?Physical Exam: ?GEN: NAD, alert and oriented x 3, wd/wn ?HEENT: NCAT, PERRL, EOMI, sclera clear, MMM ?PULM: CTAB w/o wheezes/crackles, normal respiratory effort ?CV: Irregularly irregular rhythm, normal rate w/o M/G/R ?GI: abd soft, NTND, NABS, no R/G/M ?MSK: no peripheral edema, muscle strength globally intact 5/5 bilateral upper/lower extremities ?NEURO: CN II-XII intact, no focal deficits, sensation to light touch intact ?PSYCH: normal mood/affect ?Integumentary: dry/intact, no rashes or wounds ? ? ? ?Data Reviewed: I have personally reviewed following labs and imaging studies ? ?CBC: ?Recent Labs  ?Lab 03/30/21 ?1841 03/30/21 ?2330 03/31/21 ?9326 04/02/21 ?0400  ?WBC 12.8* 9.7 10.6* 6.1  ?NEUTROABS 7.6  --  8.4* 3.4  ?HGB 13.7 13.4  12.9 13.7  ?HCT 40.1 39.0 37.6 40.8  ?MCV 98.5 99.2 95.9 98.8  ?PLT 135* 125* PLATELET CLUMPS NOTED ON SMEAR, UNABLE TO ESTIMATE 110*  ? ?Basic Metabolic Panel: ?Recent Labs  ?Lab 03/30/21 ?2330 03/31/21 ?3241 03/31/21 ?1157 04/01/21 ?1002 04/02/21 ?0400  ?NA 122* 128* 126* 130* 134*   ?K 4.3 3.5 3.7 3.3* 4.4  ?CL 87* 89* 87* 91* 97*  ?CO2 '24 27 24 29 26  '$ ?GLUCOSE 112* 88 101* 121* 90  ?BUN '22 17 18 17 18  '$ ?CREATININE 1.00 1.04* 1.16* 1.34* 1.13*  ?CALCIUM 9.7 9.3 9.4 9.0 9.2  ?MG  --   --   --  1

## 2021-04-03 NOTE — Progress Notes (Signed)
? ?Progress Note ? ?Patient Name: Nicole Mercado ?Date of Encounter: 04/03/2021 ? ?[facility] Cardiologist: Sherren Mocha, MD  ? ?Subjective  ? ?Patient denies dizziness   No SOB  No palpitations   No CP  ? ?Inpatient Medications  ?  ?Scheduled Meds: ? apixaban  2.5 mg Oral BID  ? aspirin  81 mg Oral Daily  ? cephALEXin  500 mg Oral Q12H  ? metoprolol tartrate  100 mg Oral BID  ? ?Continuous Infusions: ? diltiazem (CARDIZEM) infusion 5 mg/hr (04/02/21 1958)  ? ?PRN Meds: ?acetaminophen  ? ?Vital Signs  ?  ?Vitals:  ? 04/02/21 2010 04/02/21 2025 04/02/21 2320 04/03/21 0413  ?BP: (!) 147/68 (!) 145/64 128/75 132/78  ?Pulse: 62 100 76 84  ?Resp: '15 17 18 '$ (!) 25  ?Temp:   98.8 ?F (37.1 ?C) 98.2 ?F (36.8 ?C)  ?TempSrc:   Oral Oral  ?SpO2:   97% 96%  ?Weight:      ?Height:      ? ? ?Intake/Output Summary (Last 24 hours) at 04/03/2021 0615 ?Last data filed at 04/02/2021 8657 ?Gross per 24 hour  ?Intake 295.03 ml  ?Output --  ?Net 295.03 ml  ? ? ?  04/01/2021  ?  3:16 AM 03/31/2021  ?  2:00 AM 03/25/2021  ?  2:38 PM  ?Last 3 Weights  ?Weight (lbs) 94 lb 2.2 oz 97 lb 7.1 oz 98 lb  ?Weight (kg) 42.7 kg 44.2 kg 44.453 kg  ?   ? ?Telemetry  ?  ?Afib   80s  - Personally Reviewed ? ?ECG  ?  ? - Personally Reviewed ? ?Physical Exam  ? ?GEN: No acute distress.   ?Neck: No JVD ?Cardiac: Irreg irreg   No S3  ?Respiratory: Clear to auscultation bilaterally. ?GI: Soft, nontender, non-distended  ?MS: No edema; No deformity. ?Neuro:  Nonfocal  ?Psych: Normal affect  ? ?Labs  ?  ?High Sensitivity Troponin:   ?Recent Labs  ?Lab 03/19/21 ?1505 03/19/21 ?1705 03/30/21 ?1841 03/30/21 ?2045  ?TROPONINIHS 25* 26* 18* 15  ?   ?Chemistry ?Recent Labs  ?Lab 03/30/21 ?1841 03/30/21 ?2330 03/31/21 ?8469 03/31/21 ?1157 04/01/21 ?1002 04/02/21 ?0400  ?NA 122*   < > 128* 126* 130* 134*  ?K 5.1   < > 3.5 3.7 3.3* 4.4  ?CL 87*   < > 89* 87* 91* 97*  ?CO2 24   < > '27 24 29 26  '$ ?GLUCOSE 118*   < > 88 101* 121* 90  ?BUN 24*   < > '17 18 17 18  '$ ?CREATININE  0.98   < > 1.04* 1.16* 1.34* 1.13*  ?CALCIUM 9.8   < > 9.3 9.4 9.0 9.2  ?MG  --   --   --   --  1.5*  --   ?PROT 6.2*  --  4.9*  --  5.0*  --   ?ALBUMIN 3.6  --  3.0*  --  2.9*  --   ?AST 185*  --  118*  --  61*  --   ?ALT 338*  --  251*  --  168*  --   ?ALKPHOS 116  --  99  --  85  --   ?BILITOT 1.4*  --  1.5*  --  1.1  --   ?GFRNONAA 58*   < > 54* 48* 40* 49*  ?ANIONGAP 11   < > '12 15 10 11  '$ ? < > = values in this interval not displayed.  ?  ?  Lipids  ?Recent Labs  ?Lab 03/30/21 ?2331  ?CHOL 96  ?TRIG 133  ?HDL 36*  ?[place] 33  ?CHOLHDL 2.7  ?  ?Hematology ?Recent Labs  ?Lab 03/30/21 ?2330 03/31/21 ?8182 04/02/21 ?0400  ?WBC 9.7 10.6* 6.1  ?RBC 3.93 3.92 4.13  ?HGB 13.4 12.9 13.7  ?HCT 39.0 37.6 40.8  ?MCV 99.2 95.9 98.8  ?MCH 34.1* 32.9 33.2  ?MCHC 34.4 34.3 33.6  ?RDW 13.9 13.8 13.9  ?PLT 125* PLATELET CLUMPS NOTED ON SMEAR, UNABLE TO ESTIMATE 110*  ? ?Thyroid  ?Recent Labs  ?Lab 03/30/21 ?2330  ?TSH 2.503  ?  ?BNP ?Recent Labs  ?Lab 03/30/21 ?2011  ?BNP 910.5*  ?  ?DDimer No results for input(s): DDIMER in the last 168 hours.  ? ?Radiology  ?  ?ECHOCARDIOGRAM LIMITED BUBBLE STUDY ? ?Result Date: 04/01/2021 ?   ECHOCARDIOGRAM LIMITED REPORT   Patient Name:   Nicole Mercado Date of Exam: 04/01/2021 Medical Rec #:  [id number]     Height:       60.0 in Accession #:    [id number]    Weight:       94.1 lb Date of Birth:  [birth year]    BSA:          1.355 m? Patient Age:    81 years      BP:           117/67 mmHg Patient Gender: F             HR:           115 bpm. Exam Location:  Inpatient Procedure: Limited Echo, Cardiac Doppler, Color Doppler and Saline Contrast            Bubble Study Indications:    TIA  History:        Patient has prior history of Echocardiogram examinations, most                 recent 03/20/2021. Abnormal ECG, TIA, Aortic Valve Disease,                 Arrythmias:Atrial Fibrillation, Signs/Symptoms:Murmur and                 Syncope; Risk Factors:Hypertension. Aortic stenosis. Post                  thoracentesis.                 Aortic Valve: 21 mm [facility] Ease pericardial valve valve                 is present in the aortic position. Procedure Date: 01/17/14.  Sonographer:    Roseanna Rainbow RDCS Referring Phys: Theola Sequin IMPRESSIONS  1. Left ventricular ejection fraction, by estimation, is 55 to 60%. The left ventricle has normal function. The left ventricle has no regional wall motion abnormalities. There is moderate left ventricular hypertrophy. Left ventricular diastolic function  could not be evaluated.  2. Right ventricular systolic function is normal. The right ventricular size is normal. There is moderately elevated pulmonary artery systolic pressure.  3. Moderate pleural effusion in the left lateral region.  4. The mitral valve is normal in structure. Mild mitral valve regurgitation. No evidence of mitral stenosis.  5. The aortic valve is normal in structure. Aortic valve regurgitation is mild. No aortic stenosis is present. There is a 21 mm [facility] Ease pericardial valve valve present in the aortic position. Procedure Date: 01/17/14.  6. The inferior vena cava is dilated in size with <50% respiratory variability, suggesting right atrial pressure of 15 mmHg.  7. Agitated saline contrast bubble study was negative, with no evidence of any interatrial shunt. FINDINGS  Left Ventricle: Left ventricular ejection fraction, by estimation, is 55 to 60%. The left ventricle has normal function. The left ventricle has no regional wall motion abnormalities. The left ventricular internal cavity size was normal in size. There is  moderate left ventricular hypertrophy. Left ventricular diastolic function could not be evaluated. Left ventricular diastolic function could not be evaluated due to atrial fibrillation. Right Ventricle: The right ventricular size is normal. Right ventricular systolic function is normal. There is moderately elevated pulmonary artery systolic pressure. The tricuspid regurgitant  velocity is 2.89 m/s, and with an assumed right atrial pressure of 15 mmHg, the estimated right ventricular systolic pressure is 93.5 mmHg. Left Atrium: Left atrial size was normal in size. Right Atrium: Right atrial size was normal in size. Pericardium: There is no evidence of pericardial effusion. Mitral Valve: The mitral valve is normal in structure. Mild mitral valve regurgitation. No evidence of mitral valve stenosis. Tricuspid Valve: The tricuspid valve is normal in structure. Tricuspid valve regurgitation is mild . No evidence of tricuspid stenosis. Aortic Valve: The aortic valve is normal in structure. Aortic valve regurgitation is mild. No aortic stenosis is present. Aortic valve mean gradient measures 5.0 mmHg. Aortic valve peak gradient measures 11.4 mmHg. There is a 21 mm [facility] Ease pericardial valve valve present in the aortic position. Procedure Date: 01/17/14. Pulmonic Valve: The pulmonic valve was normal in structure. Pulmonic valve regurgitation is not visualized. No evidence of pulmonic stenosis. Aorta: The aortic root is normal in size and structure. Venous: The inferior vena cava is dilated in size with less than 50% respiratory variability, suggesting right atrial pressure of 15 mmHg. IAS/Shunts: No atrial level shunt detected by color flow Doppler. Agitated saline contrast was given intravenously to evaluate for intracardiac shunting. Agitated saline contrast bubble study was negative, with no evidence of any interatrial shunt. Additional Comments: There is a moderate pleural effusion in the left lateral region. LEFT VENTRICLE PLAX 2D LVIDd:         3.00 cm LVIDs:         2.00 cm LV PW:         1.00 cm LV IVS:        1.30 cm LVOT diam:     1.50 cm LV SV:         33 LV SV Index:   25 LVOT Area:     1.77 cm?  IVC IVC diam: 2.10 cm LEFT ATRIUM         Index LA diam:    4.40 cm 3.25 cm/m?  AORTIC VALVE AV Area (Vmax):    1.41 cm? AV Area (Vmean):   1.57 cm? AV Area (VTI):     1.13 cm? AV  Vmax:           169.00 cm/s AV Vmean:          108.000 cm/s AV VTI:            0.295 m AV Peak Grad:      11.4 mmHg AV Mean Grad:      5.0 mmHg LVOT Vmax:         135.00 cm/s LVOT Vmean:        96.200 cm/s L

## 2021-04-03 NOTE — Hospital Course (Signed)
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  Noted by friend around 5 PM on date of admission that she was slurring her words and not making any sense.  Last known normal was roughly 4 PM. ? ?In the ED, temperature 97.6 ?F, BP 165/90, RR 13, SPO2 93% on room air.  Sodium 122, potassium 5.1, chloride 87, CO2 24, glucose 118, BUN 24, creatinine 0.98, AST 185, ALT 338, total bilirubin 1.4.  WBC 12.8, hemoglobin 13.7, platelets 135.  INR 1.4.  COVID-19 PCR negative.  Influenza A/B PCR negative.  Urinalysis with moderate leukocytes, positive nitrite, many bacteria, greater than 50 WBCs.  Patient initially had some difficulty speaking, but gradually improved back to her baseline.  CT head without contrast negative for acute findings.  CTA head/neck negative for large vessel occlusion.  Neurology was consulted.  Patient was also noted to be in A-fib with RVR and started on a Cardizem drip.  Hospitalist service consulted for further evaluation and management of slurred speech and A-fib with RVR.  Patient was transferred to [facility] for further evaluation and treatment. ?

## 2021-04-03 NOTE — Assessment & Plan Note (Addendum)
Chest x-ray with bilateral pleural effusions.  Patient underwent ultrasound-guided right thoracentesis by IR on 03/31/2020 yielding 600 cc of yellow fluid.  No malignant cells on cytology.  No organisms seen on Gram stain.  Pleural fluid culture with no growth. ?

## 2021-04-03 NOTE — Assessment & Plan Note (Addendum)
Patient initially presenting with slurred speech/aphasia.  Resolved in the ED.  Neurology was consulted and followed during initial hospital course.  CT head without contrast with no acute hemorrhage/infarct, mild global parenchymal volume loss and moderate chronic white matter microangiopathy, small remote lacunar infarcts right basal ganglia and left insula.  MRI head no acute infarct with moderate chronic small vessel ischemia, small remote cerebellar infarct.  CTA head/neck with mixed plaque proximal right internal carotid artery resulting in less than 50% stenosis, minimal plaque left carotid system, mild stenosis origin left vertebral artery and V2 segment, otherwise patent vertebral arteries, intracranial atherosclerotic disease resulting in mild/moderate stenosis left supraclinoid ICA.  TTE with LVEF 60 to 65%, 21 mm [facility] Ease valve present in the aortic position, no intra-atrial shunt noted.  LDL 33, hemoglobin A1c 5.3.  Neurology unclear if this was TIA versus syncopal event from A-fib with RVR.  Neurology now signed off.  Aspirin 81 mg p.o. daily.  Outpatient follow-up stroke clinic 4-6 weeks. ?

## 2021-04-03 NOTE — Assessment & Plan Note (Signed)
Follows with medical oncology outpatient, Dr. Alvy Bimler. On treatment with [facility].  Continue outpatient follow-up. ?

## 2021-04-03 NOTE — Assessment & Plan Note (Signed)
TTE with 21 mm [facility] Ease pericardial valve present in the aortic position.  Continue outpatient follow-up with cardiology. ?

## 2021-04-03 NOTE — Assessment & Plan Note (Addendum)
Etiology likely secondary to mild shock liver in the setting of A-fib with RVR and likely poor perfusion.  Right upper quadrant ultrasound with mildly increased echogenicity of the liver consistent with fatty infiltration, no obvious focal liver lesions, right-sided pleural effusion, prior cholecystectomy.  Patient's LFTs were monitored during hospital course and improved at time of discharge.  Recommend repeat CMP 1 week.  Okay to resume atorvastatin after discharge. ?

## 2021-04-03 NOTE — H&P (View-Only) (Signed)
? ?Progress Note ? ?Patient Name: Nicole Mercado ?Date of Encounter: 04/03/2021 ? ?[facility] Cardiologist: Sherren Mocha, MD  ? ?Subjective  ? ?Patient denies dizziness   No SOB  No palpitations   No CP  ? ?Inpatient Medications  ?  ?Scheduled Meds: ? apixaban  2.5 mg Oral BID  ? aspirin  81 mg Oral Daily  ? cephALEXin  500 mg Oral Q12H  ? metoprolol tartrate  100 mg Oral BID  ? ?Continuous Infusions: ? diltiazem (CARDIZEM) infusion 5 mg/hr (04/02/21 1958)  ? ?PRN Meds: ?acetaminophen  ? ?Vital Signs  ?  ?Vitals:  ? 04/02/21 2010 04/02/21 2025 04/02/21 2320 04/03/21 0413  ?BP: (!) 147/68 (!) 145/64 128/75 132/78  ?Pulse: 62 100 76 84  ?Resp: '15 17 18 '$ (!) 25  ?Temp:   98.8 ?F (37.1 ?C) 98.2 ?F (36.8 ?C)  ?TempSrc:   Oral Oral  ?SpO2:   97% 96%  ?Weight:      ?Height:      ? ? ?Intake/Output Summary (Last 24 hours) at 04/03/2021 0615 ?Last data filed at 04/02/2021 5361 ?Gross per 24 hour  ?Intake 295.03 ml  ?Output --  ?Net 295.03 ml  ? ? ?  04/01/2021  ?  3:16 AM 03/31/2021  ?  2:00 AM 03/25/2021  ?  2:38 PM  ?Last 3 Weights  ?Weight (lbs) 94 lb 2.2 oz 97 lb 7.1 oz 98 lb  ?Weight (kg) 42.7 kg 44.2 kg 44.453 kg  ?   ? ?Telemetry  ?  ?Afib   80s  - Personally Reviewed ? ?ECG  ?  ? - Personally Reviewed ? ?Physical Exam  ? ?GEN: No acute distress.   ?Neck: No JVD ?Cardiac: Irreg irreg   No S3  ?Respiratory: Clear to auscultation bilaterally. ?GI: Soft, nontender, non-distended  ?MS: No edema; No deformity. ?Neuro:  Nonfocal  ?Psych: Normal affect  ? ?Labs  ?  ?High Sensitivity Troponin:   ?Recent Labs  ?Lab 03/19/21 ?1505 03/19/21 ?1705 03/30/21 ?1841 03/30/21 ?2045  ?TROPONINIHS 25* 26* 18* 15  ?   ?Chemistry ?Recent Labs  ?Lab 03/30/21 ?1841 03/30/21 ?2330 03/31/21 ?4431 03/31/21 ?1157 04/01/21 ?1002 04/02/21 ?0400  ?NA 122*   < > 128* 126* 130* 134*  ?K 5.1   < > 3.5 3.7 3.3* 4.4  ?CL 87*   < > 89* 87* 91* 97*  ?CO2 24   < > '27 24 29 26  '$ ?GLUCOSE 118*   < > 88 101* 121* 90  ?BUN 24*   < > '17 18 17 18  '$ ?CREATININE  0.98   < > 1.04* 1.16* 1.34* 1.13*  ?CALCIUM 9.8   < > 9.3 9.4 9.0 9.2  ?MG  --   --   --   --  1.5*  --   ?PROT 6.2*  --  4.9*  --  5.0*  --   ?ALBUMIN 3.6  --  3.0*  --  2.9*  --   ?AST 185*  --  118*  --  61*  --   ?ALT 338*  --  251*  --  168*  --   ?ALKPHOS 116  --  99  --  85  --   ?BILITOT 1.4*  --  1.5*  --  1.1  --   ?GFRNONAA 58*   < > 54* 48* 40* 49*  ?ANIONGAP 11   < > '12 15 10 11  '$ ? < > = values in this interval not displayed.  ?  ?  Lipids  ?Recent Labs  ?Lab 03/30/21 ?2331  ?CHOL 96  ?TRIG 133  ?HDL 36*  ?[place] 33  ?CHOLHDL 2.7  ?  ?Hematology ?Recent Labs  ?Lab 03/30/21 ?2330 03/31/21 ?3329 04/02/21 ?0400  ?WBC 9.7 10.6* 6.1  ?RBC 3.93 3.92 4.13  ?HGB 13.4 12.9 13.7  ?HCT 39.0 37.6 40.8  ?MCV 99.2 95.9 98.8  ?MCH 34.1* 32.9 33.2  ?MCHC 34.4 34.3 33.6  ?RDW 13.9 13.8 13.9  ?PLT 125* PLATELET CLUMPS NOTED ON SMEAR, UNABLE TO ESTIMATE 110*  ? ?Thyroid  ?Recent Labs  ?Lab 03/30/21 ?2330  ?TSH 2.503  ?  ?BNP ?Recent Labs  ?Lab 03/30/21 ?2011  ?BNP 910.5*  ?  ?DDimer No results for input(s): DDIMER in the last 168 hours.  ? ?Radiology  ?  ?ECHOCARDIOGRAM LIMITED BUBBLE STUDY ? ?Result Date: 04/01/2021 ?   ECHOCARDIOGRAM LIMITED REPORT   Patient Name:   Nicole Mercado Date of Exam: 04/01/2021 Medical Rec #:  [id number]     Height:       60.0 in Accession #:    [id number]    Weight:       94.1 lb Date of Birth:  20-Jun-1940    BSA:          1.355 m? Patient Age:    81 years      BP:           117/67 mmHg Patient Gender: F             HR:           115 bpm. Exam Location:  Inpatient Procedure: Limited Echo, Cardiac Doppler, Color Doppler and Saline Contrast            Bubble Study Indications:    TIA  History:        Patient has prior history of Echocardiogram examinations, most                 recent 03/20/2021. Abnormal ECG, TIA, Aortic Valve Disease,                 Arrythmias:Atrial Fibrillation, Signs/Symptoms:Murmur and                 Syncope; Risk Factors:Hypertension. Aortic stenosis. Post                  thoracentesis.                 Aortic Valve: 21 mm [facility] Ease pericardial valve valve                 is present in the aortic position. Procedure Date: 01/17/14.  Sonographer:    Roseanna Rainbow RDCS Referring Phys: Theola Sequin IMPRESSIONS  1. Left ventricular ejection fraction, by estimation, is 55 to 60%. The left ventricle has normal function. The left ventricle has no regional wall motion abnormalities. There is moderate left ventricular hypertrophy. Left ventricular diastolic function  could not be evaluated.  2. Right ventricular systolic function is normal. The right ventricular size is normal. There is moderately elevated pulmonary artery systolic pressure.  3. Moderate pleural effusion in the left lateral region.  4. The mitral valve is normal in structure. Mild mitral valve regurgitation. No evidence of mitral stenosis.  5. The aortic valve is normal in structure. Aortic valve regurgitation is mild. No aortic stenosis is present. There is a 21 mm [facility] Ease pericardial valve valve present in the aortic position. Procedure Date: 01/17/14.  6. The inferior vena cava is dilated in size with <50% respiratory variability, suggesting right atrial pressure of 15 mmHg.  7. Agitated saline contrast bubble study was negative, with no evidence of any interatrial shunt. FINDINGS  Left Ventricle: Left ventricular ejection fraction, by estimation, is 55 to 60%. The left ventricle has normal function. The left ventricle has no regional wall motion abnormalities. The left ventricular internal cavity size was normal in size. There is  moderate left ventricular hypertrophy. Left ventricular diastolic function could not be evaluated. Left ventricular diastolic function could not be evaluated due to atrial fibrillation. Right Ventricle: The right ventricular size is normal. Right ventricular systolic function is normal. There is moderately elevated pulmonary artery systolic pressure. The tricuspid regurgitant  velocity is 2.89 m/s, and with an assumed right atrial pressure of 15 mmHg, the estimated right ventricular systolic pressure is 90.2 mmHg. Left Atrium: Left atrial size was normal in size. Right Atrium: Right atrial size was normal in size. Pericardium: There is no evidence of pericardial effusion. Mitral Valve: The mitral valve is normal in structure. Mild mitral valve regurgitation. No evidence of mitral valve stenosis. Tricuspid Valve: The tricuspid valve is normal in structure. Tricuspid valve regurgitation is mild . No evidence of tricuspid stenosis. Aortic Valve: The aortic valve is normal in structure. Aortic valve regurgitation is mild. No aortic stenosis is present. Aortic valve mean gradient measures 5.0 mmHg. Aortic valve peak gradient measures 11.4 mmHg. There is a 21 mm [facility] Ease pericardial valve valve present in the aortic position. Procedure Date: 01/17/14. Pulmonic Valve: The pulmonic valve was normal in structure. Pulmonic valve regurgitation is not visualized. No evidence of pulmonic stenosis. Aorta: The aortic root is normal in size and structure. Venous: The inferior vena cava is dilated in size with less than 50% respiratory variability, suggesting right atrial pressure of 15 mmHg. IAS/Shunts: No atrial level shunt detected by color flow Doppler. Agitated saline contrast was given intravenously to evaluate for intracardiac shunting. Agitated saline contrast bubble study was negative, with no evidence of any interatrial shunt. Additional Comments: There is a moderate pleural effusion in the left lateral region. LEFT VENTRICLE PLAX 2D LVIDd:         3.00 cm LVIDs:         2.00 cm LV PW:         1.00 cm LV IVS:        1.30 cm LVOT diam:     1.50 cm LV SV:         33 LV SV Index:   25 LVOT Area:     1.77 cm?  IVC IVC diam: 2.10 cm LEFT ATRIUM         Index LA diam:    4.40 cm 3.25 cm/m?  AORTIC VALVE AV Area (Vmax):    1.41 cm? AV Area (Vmean):   1.57 cm? AV Area (VTI):     1.13 cm? AV  Vmax:           169.00 cm/s AV Vmean:          108.000 cm/s AV VTI:            0.295 m AV Peak Grad:      11.4 mmHg AV Mean Grad:      5.0 mmHg LVOT Vmax:         135.00 cm/s LVOT Vmean:        96.200 cm/s L

## 2021-04-03 NOTE — Assessment & Plan Note (Addendum)
Sodium 122 on admission.  Likely secondary to hypovolemic hyponatremia.  Improved to 132 at time of discharge.  CMP 1 week. ?

## 2021-04-03 NOTE — Assessment & Plan Note (Addendum)
Urine culture with greater than 100 K E. coli.  Completed 5-day course initially with ceftriaxone followed by Keflex. ?

## 2021-04-03 NOTE — Assessment & Plan Note (Signed)
Platelet count 135, stable.  Follows with hematology outpatient.  On aspirin. ?

## 2021-04-03 NOTE — Assessment & Plan Note (Addendum)
Baseline creatinine 0.9-1.1.,  Stable, 1.05 at time of discharge ?

## 2021-04-04 ENCOUNTER — Encounter (HOSPITAL_COMMUNITY): Payer: Self-pay | Admitting: Internal Medicine

## 2021-04-04 ENCOUNTER — Inpatient Hospital Stay (HOSPITAL_COMMUNITY): Payer: Medicare Other

## 2021-04-04 ENCOUNTER — Inpatient Hospital Stay (HOSPITAL_COMMUNITY): Payer: Medicare Other | Admitting: Anesthesiology

## 2021-04-04 ENCOUNTER — Encounter (HOSPITAL_COMMUNITY)
Admission: EM | Disposition: A | Discharge status: Skilled Nursing Facility | Payer: Self-pay | Source: Home / Self Care | Attending: Internal Medicine

## 2021-04-04 ENCOUNTER — Ambulatory Visit: Payer: Medicare Other | Admitting: Cardiovascular Disease

## 2021-04-04 DIAGNOSIS — I2510 Atherosclerotic heart disease of native coronary artery without angina pectoris: Secondary | ICD-10-CM

## 2021-04-04 DIAGNOSIS — E876 Hypokalemia: Secondary | ICD-10-CM | POA: Diagnosis not present

## 2021-04-04 DIAGNOSIS — I10 Essential (primary) hypertension: Secondary | ICD-10-CM

## 2021-04-04 DIAGNOSIS — I4891 Unspecified atrial fibrillation: Secondary | ICD-10-CM

## 2021-04-04 DIAGNOSIS — I3139 Other pericardial effusion (noninflammatory): Secondary | ICD-10-CM

## 2021-04-04 DIAGNOSIS — I340 Nonrheumatic mitral (valve) insufficiency: Secondary | ICD-10-CM

## 2021-04-04 HISTORY — PX: CARDIOVERSION: SHX1299

## 2021-04-04 HISTORY — PX: TEE WITHOUT CARDIOVERSION: SHX5443

## 2021-04-04 LAB — COMPREHENSIVE METABOLIC PANEL
ALT: 78 U/L — ABNORMAL HIGH (ref 0–44)
AST: 33 U/L (ref 15–41)
Albumin: 2.8 g/dL — ABNORMAL LOW (ref 3.5–5.0)
Alkaline Phosphatase: 74 U/L (ref 38–126)
Anion gap: 10 (ref 5–15)
BUN: 17 mg/dL (ref 8–23)
CO2: 24 mmol/L (ref 22–32)
Calcium: 8.5 mg/dL — ABNORMAL LOW (ref 8.9–10.3)
Chloride: 103 mmol/L (ref 98–111)
Creatinine, Ser: 1.02 mg/dL — ABNORMAL HIGH (ref 0.44–1.00)
GFR, Estimated: 56 mL/min — ABNORMAL LOW (ref >60)
Glucose, Bld: 96 mg/dL (ref 70–99)
Potassium: 3.3 mmol/L — ABNORMAL LOW (ref 3.5–5.1)
Sodium: 137 mmol/L (ref 135–145)
Total Bilirubin: 1 mg/dL (ref 0.3–1.2)
Total Protein: 4.6 g/dL — ABNORMAL LOW (ref 6.5–8.1)

## 2021-04-04 LAB — CBC WITH DIFFERENTIAL/PLATELET
Abs Immature Granulocytes: 0.01 10*3/uL (ref 0.00–0.07)
Basophils Absolute: 0 10*3/uL (ref 0.0–0.1)
Basophils Relative: 1 %
Eosinophils Absolute: 0.1 10*3/uL (ref 0.0–0.5)
Eosinophils Relative: 2 %
HCT: 38.4 % (ref 36.0–46.0)
Hemoglobin: 12.8 g/dL (ref 12.0–15.0)
Immature Granulocytes: 0 %
Lymphocytes Relative: 28 %
Lymphs Abs: 1.4 10*3/uL (ref 0.7–4.0)
MCH: 32.9 pg (ref 26.0–34.0)
MCHC: 33.3 g/dL (ref 30.0–36.0)
MCV: 98.7 fL (ref 80.0–100.0)
Monocytes Absolute: 0.6 10*3/uL (ref 0.1–1.0)
Monocytes Relative: 12 %
Neutro Abs: 2.9 10*3/uL (ref 1.7–7.7)
Neutrophils Relative %: 57 %
Platelets: 109 10*3/uL — ABNORMAL LOW (ref 150–400)
RBC: 3.89 MIL/uL (ref 3.87–5.11)
RDW: 13.7 % (ref 11.5–15.5)
WBC: 5.1 10*3/uL (ref 4.0–10.5)
nRBC: 0 % (ref 0.0–0.2)

## 2021-04-04 LAB — MAGNESIUM: Magnesium: 1.6 mg/dL — ABNORMAL LOW (ref 1.7–2.4)

## 2021-04-04 SURGERY — ECHOCARDIOGRAM, TRANSESOPHAGEAL
Anesthesia: Monitor Anesthesia Care

## 2021-04-04 MED ORDER — SODIUM CHLORIDE 0.9 % IV SOLN: INTRAVENOUS | Status: DC

## 2021-04-04 MED ORDER — MAGNESIUM SULFATE 2 GM/50ML IV SOLN
2.0000 g | Freq: Once | INTRAVENOUS | Status: AC
  Administered 2021-04-04: 2 g via INTRAVENOUS

## 2021-04-04 MED ORDER — BUTAMBEN-TETRACAINE-BENZOCAINE 2-2-14 % EX AERO
INHALATION_SPRAY | CUTANEOUS | Status: DC | PRN
  Administered 2021-04-04: 2 via TOPICAL

## 2021-04-04 MED ORDER — DILTIAZEM HCL ER COATED BEADS 120 MG PO CP24
120.0000 mg | ORAL_CAPSULE | Freq: Every day | ORAL | Status: DC
  Administered 2021-04-04: 120 mg via ORAL
  Filled 2021-04-04: qty 1

## 2021-04-04 MED ORDER — POTASSIUM CHLORIDE CRYS ER 20 MEQ PO TBCR
30.0000 meq | EXTENDED_RELEASE_TABLET | ORAL | Status: AC
  Administered 2021-04-04 (×2): 30 meq via ORAL
  Filled 2021-04-04 (×2): qty 1

## 2021-04-04 MED ORDER — PROPOFOL 10 MG/ML IV BOLUS
INTRAVENOUS | Status: DC | PRN
Start: 2021-04-04 — End: 2021-04-04
  Administered 2021-04-04: 30 mg via INTRAVENOUS

## 2021-04-04 MED ORDER — PROPOFOL 500 MG/50ML IV EMUL
INTRAVENOUS | Status: DC | PRN
  Administered 2021-04-04: 75 ug/kg/min via INTRAVENOUS

## 2021-04-04 NOTE — CV Procedure (Signed)
? ? ?  TRANSESOPHAGEAL ECHOCARDIOGRAM  ? ?NAME:  Nicole Mercado    ?MRN: [id number] ?DOB:  06/28/1940    ?ADMIT DATE: 03/30/2021 ? ?INDICATIONS: ?Atrial fib ? ?PROCEDURE:  ? ?Informed consent was obtained prior to the procedure. The risks, benefits and alternatives for the procedure were discussed and the patient comprehended these risks.  Risks include, but are not limited to, cough, sore throat, vomiting, nausea, somnolence, esophageal and stomach trauma or perforation, bleeding, low blood pressure, aspiration, pneumonia, infection, trauma to the teeth and death.   ? ?Procedural time out performed. The oropharynx was anesthetized with viscous lidocaine. ? ?Anesthesia was administered by the anaesthesilogy team to achieve and maintain moderate to deep conscious sedation.  The patient's heart rate, blood pressure, and oxygen saturation were monitored continuously during the procedure. ? ?The transesophageal probe was inserted in the esophagus and stomach without difficulty and multiple views were obtained.  ? ?The patient tolerated the procedure well. ? ?COMPLICATIONS:   ? ?There were no immediate complications. ? ?KEY FINDINGS: ? 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  ?Full report to follow. ?Further management per primary team.  ? ?DC cardioversion not performed due to left atrial appendage clot.  Recommend continue anticoagulation with no attempts of cardioversion until 4 weeks after. ?Discussed result with treating team. ? ?[name], DO [facility] ?[place]  ?8:25 AM ?  ?

## 2021-04-04 NOTE — Assessment & Plan Note (Addendum)
Repleted during hospitalization.  Magnesium 1.9 at time of discharge. ?

## 2021-04-04 NOTE — Plan of Care (Signed)
°  Problem: Education: °Goal: Knowledge of disease or condition will improve °Outcome: Progressing °Goal: Knowledge of secondary prevention will improve (SELECT ALL) °Outcome: Progressing °Goal: Knowledge of patient specific risk factors will improve (INDIVIDUALIZE FOR PATIENT) °Outcome: Progressing °Goal: Individualized Educational Video(s) °Outcome: Progressing °  °Problem: Coping: °Goal: Will verbalize positive feelings about self °Outcome: Progressing °Goal: Will identify appropriate support needs °Outcome: Progressing °  °Problem: Health Behavior/Discharge Planning: °Goal: Ability to manage health-related needs will improve °Outcome: Progressing °  °Problem: Self-Care: °Goal: Ability to participate in self-care as condition permits will improve °Outcome: Progressing °Goal: Verbalization of feelings and concerns over difficulty with self-care will improve °Outcome: Progressing °Goal: Ability to communicate needs accurately will improve °Outcome: Progressing °  °Problem: Nutrition: °Goal: Risk of aspiration will decrease °Outcome: Progressing °Goal: Dietary intake will improve °Outcome: Progressing °  °Problem: Ischemic Stroke/TIA Tissue Perfusion: °Goal: Complications of ischemic stroke/TIA will be minimized °Outcome: Progressing °  °Problem: Education: °Goal: Knowledge of General Education information will improve °Description: Including pain rating scale, medication(s)/side effects and non-pharmacologic comfort measures °Outcome: Progressing °  °Problem: Health Behavior/Discharge Planning: °Goal: Ability to manage health-related needs will improve °Outcome: Progressing °  °Problem: Clinical Measurements: °Goal: Ability to maintain clinical measurements within normal limits will improve °Outcome: Progressing °Goal: Will remain free from infection °Outcome: Progressing °Goal: Diagnostic test results will improve °Outcome: Progressing °Goal: Respiratory complications will improve °Outcome: Progressing °Goal:  Cardiovascular complication will be avoided °Outcome: Progressing °  °Problem: Activity: °Goal: Risk for activity intolerance will decrease °Outcome: Progressing °  °Problem: Nutrition: °Goal: Adequate nutrition will be maintained °Outcome: Progressing °  °Problem: Coping: °Goal: Level of anxiety will decrease °Outcome: Progressing °  °Problem: Elimination: °Goal: Will not experience complications related to bowel motility °Outcome: Progressing °Goal: Will not experience complications related to urinary retention °Outcome: Progressing °  °Problem: Pain Managment: °Goal: General experience of comfort will improve °Outcome: Progressing °  °Problem: Safety: °Goal: Ability to remain free from injury will improve °Outcome: Progressing °  °Problem: Skin Integrity: °Goal: Risk for impaired skin integrity will decrease °Outcome: Progressing °  °

## 2021-04-04 NOTE — Progress Notes (Signed)
Physical Therapy Treatment ?Patient Details ?Name: Nicole Mercado ?MRN: [id number] ?DOB: Feb 01, 1940 ?Today's Date: 04/04/2021 ? ? ?History of Present Illness 81 y.o. femalewas brought to the ER 03/30/21 after patient's friend noticed that patient had difficulty to speak and also her speech was not making sense. In ER pt initially had some difficulty speaking but gradually became back to baseline. CT head and CT angiogram with no acute findings Pt found to be in A-fib with RVR. BNP was elevated at 900 chest x-ray shows bilateral pleural effusion which is new. Admitted overnight for obsevarion.  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 S/p TEE on 3/30. ? ?  ?PT Comments  ? ? Focus of session today was functional tranfers, gait, and dynamic balance challenges. The patient tolerated well.  Pt. Shows overall improvement with her transfer ability, decreased need for support during gait, and balance. Overall strength, dynamic balance, cognition and endurance are/is still limiting function. She still shows limitations when increased cognitive demand was placed during functional activity, breakdown in gait speed/quality and inability to navigate back to her room. Pt. Would benefit from skilled PT to continue to address functional mobility, cognition, gait, and balance. Plan and discharge setting remains unchanged. Pt to follow acutely as appropriate.  ?   ?Recommendations for follow up therapy are one component of a multi-disciplinary discharge planning process, led by the attending physician.  Recommendations may be updated based on patient status, additional functional criteria and insurance authorization. ? ?Follow Up Recommendations ? Skilled nursing-short term rehab (<3 hours/day) ?  ?  ?Assistance Recommended at Discharge Frequent or constant  Supervision/Assistance  ?Patient can return home with the following A little help with walking and/or transfers;A little help with bathing/dressing/bathroom;Direct supervision/assist for medications management;Direct supervision/assist for financial management;Assist for transportation ?  ?Equipment Recommendations ? Rolling walker (2 wheels)  ?  ?Recommendations for Other Services   ? ? ?  ?Precautions / Restrictions Precautions ?Precautions: Fall ?Precaution Comments: hx of falling ?Restrictions ?Weight Bearing Restrictions: No  ?  ? ?Mobility ? Bed Mobility ?Overal bed mobility: Needs Assistance ?Bed Mobility: Supine to Sit ?  ?  ?Supine to sit: Supervision ?  ?  ?General bed mobility comments: Able to perfrom bed mobility supervision level for line management ?  ? ?Transfers ?Overall transfer level: Needs assistance ?Equipment used: None ?Transfers: Sit to/from Stand ?Sit to Stand: Supervision ?  ?  ?  ?  ?  ?General transfer comment: Supervision, STS x3 ?  ? ?Ambulation/Gait ?Ambulation/Gait assistance: Min guard ?Gait Distance (Feet): 75 Feet (x2) ?Assistive device: None ?Gait Pattern/deviations: Step-through pattern, Decreased step length - left ?Gait velocity: slowed ?  ?  ?General Gait Details: Pt. able to ambulate without AD, had to be cued for directions and turning throughout session. No note LOB. ? ? ?Stairs ?  ?  ?  ?  ?  ? ? ?Wheelchair Mobility ?  ? ?Modified Rankin (Stroke Patients Only) ?Modified Rankin (Stroke Patients Only) ?Pre-Morbid Rankin Score: Slight disability ?Modified Rankin: Moderate disability ? ? ?  ?Balance Overall balance assessment: Needs assistance ?Sitting-balance support: Feet unsupported ?Sitting balance-Leahy Scale: Good ?Sitting balance - Comments: Pt. able to reach moderately out of BOS and maintain balance. Able to self toilet and perform clean ?  ?Standing balance support: No upper extremity supported ?Standing balance-Leahy Scale: Fair ?Standing  balance comment: Able to  tolerate minimal challenges with no note lOB ?  ?  ?  ?  ?  ?  ?High level balance activites: Backward walking, Turns (Stepping over objects, lateral stepping) ?High Level Balance Comments: Pt. shows decreased speed when performing backwards gait and turns. She was able to step over a small object (tissue box) but not over larger (basin) and perfrom lateral stepping. New challenges seem to increase cognitive demand and show slight breakdown in gait quality. ?  ?  ?  ?  ? ?  ?Cognition Arousal/Alertness: Awake/alert ?Behavior During Therapy: Flat affect ?Overall Cognitive Status: Impaired/Different from baseline ?Area of Impairment: Memory, Safety/judgement, Awareness, Problem solving, Attention ?  ?  ?  ?  ?  ?  ?  ?  ?  ?Current Attention Level: Selective ?Memory: Decreased short-term memory ?  ?Safety/Judgement: Decreased awareness of deficits ?Awareness: Emergent ?Problem Solving: Difficulty sequencing, Slow processing ?General Comments: cues for safety ?  ?  ? ?  ?Exercises   ? ?  ?General Comments   ?  ?  ? ?Pertinent Vitals/Pain Pain Assessment ?Pain Assessment: No/denies pain  ? ? ?Home Living   ?  ?  ?  ?  ?  ?  ?  ?  ?  ?   ?  ?Prior Function    ?  ?  ?   ? ?PT Goals (current goals can now be found in the care plan section)   ? ?  ?Frequency ? ? ? Min 3X/week ? ? ? ?  ?PT Plan Current plan remains appropriate  ? ? ?Co-evaluation   ?  ?  ?  ?  ? ?  ?AM-PAC PT "6 Clicks" Mobility   ?Outcome Measure ? Help needed turning from your back to your side while in a flat bed without using bedrails?: None ?Help needed moving from lying on your back to sitting on the side of a flat bed without using bedrails?: None ?Help needed moving to and from a bed to a chair (including a wheelchair)?: A Little ?Help needed standing up from a chair using your arms (e.g., wheelchair or bedside chair)?: A Little ?Help needed to walk in hospital room?: A Little ?Help needed climbing 3-5 steps with a railing? : A Lot ?6 Click Score:  19 ? ?  ?End of Session Equipment Utilized During Treatment: Gait belt ?Activity Tolerance: Patient tolerated treatment well ?Patient left: with call bell/phone within reach;in chair;with family/visitor present ?Nurse Communication: Mobility status ?PT Visit Diagnosis: Unsteadiness on feet (R26.81);Repeated falls (R29.6);Muscle weakness (generalized) (M62.81);Difficulty in walking, not elsewhere classified (R26.2) ?  ? ? ?Time: 6659-9357 ?PT Time Calculation (min) (ACUTE ONLY): 27 min ? ?Charges:  $Gait Training: 8-22 mins ?$Neuromuscular Re-education: 8-22 mins          ?          ? ?Thermon Leyland, SPT ?Acute Rehab Services ? ? ? ?Thermon Leyland ?04/04/2021, 3:29 PM ? ?

## 2021-04-04 NOTE — TOC Progression Note (Signed)
Transition of Care (TOC) - Progression Note  ? ? ?Patient Details  ?Name: MEDORA ROORDA ?MRN: [id number] ?Date of Birth: 02-28-1940 ? ?Transition of Care (TOC) CM/SW Contact  ?Pollie Friar, RN ?Phone Number: ?04/04/2021, 11:30 AM ? ?Clinical Narrative:    ?CM spoke to [name] (son) this am over the phone. He has decided on [facility] for rehab. Cm has updated Nikki at [facility] and she has confirmed they will have a bed. CM has asked the MOA to please start insurance auth.  ?Plan is for d/c to [facility] after insurance auth received.  ?TOC following. ? ? ?Expected Discharge Plan: [place] ?Barriers to Discharge: Continued Medical Work up ? ?Expected Discharge Plan and Services ?Expected Discharge Plan: [place] ?  ?  ?  ?Living arrangements for the past 2 months: Apartment ?                ?  ?  ?  ?  ?  ?  ?  ?  ?  ?  ? ? ?Social Determinants of Health (SDOH) Interventions ?  ? ?Readmission Risk Interventions ?   ? View : No data to display.  ?  ?  ?  ? ? ?

## 2021-04-04 NOTE — Progress Notes (Signed)
?PROGRESS NOTE ? ? ? ?Nicole Mercado  YBW:[id number] DOB: April 11, 1940 DOA: 03/30/2021 ?PCP: Kristie Cowman, MD  ? ? 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  Noted by friend around 5 PM on date of admission that she was slurring her words and not making any sense.  Last known normal was roughly 4 PM. ? ?In the ED, temperature 97.6 ?F, BP 165/90, RR 13, SPO2 93% on room air.  Sodium 122, potassium 5.1, chloride 87, CO2 24, glucose 118, BUN 24, creatinine 0.98, AST 185, ALT 338, total bilirubin 1.4.  WBC 12.8, hemoglobin 13.7, platelets 135.  INR 1.4.  COVID-19 PCR negative.  Influenza A/B PCR negative.  Urinalysis with moderate leukocytes, positive nitrite, many bacteria, greater than 50 WBCs.  Patient initially had some difficulty speaking, but gradually improved back to her baseline.  CT head without contrast negative for acute findings.  CTA head/neck negative for large vessel occlusion.  Neurology was consulted.  Patient was also noted to be in A-fib with RVR and started on a Cardizem drip.  Hospitalist service consulted for further evaluation and management of slurred speech and A-fib with RVR.  Patient was transferred to [facility] for further evaluation and treatment.  ? ? ?Assessment & Plan: ?  ?Assessment and Plan: ?* Persistent atrial fibrillation with RVR ([place]) ?On arrival to the ED, patient was noted to be in A-fib with RVR.  Recently diagnosed with initial plan of outpatient cardioversion.  TTE with LVEF 55-60%, normal LV function, no regional wall abnormalities, moderate LVH, diastolic primers cannot be evaluated.  TEE 3/30 with left atrial appendage clot noted and cardioversion was canceled. ?--Cardiology following,  appreciate assistance ?--Metoprolol tartrate 100 mg p.o. twice daily ?--Cardizem drip ?--Eliquis 2.5 mg p.o. twice daily ?--Continue to monitor on telemetry ? ? ?TIA (transient ischemic attack) ?Patient initially presenting with slurred speech/aphasia.  Resolved in the ED.  Neurology was consulted and followed during initial hospital course.  CT head without contrast with no acute hemorrhage/infarct, mild global parenchymal volume loss and moderate chronic white matter microangiopathy, small remote lacunar infarcts right basal ganglia and left insula.  MRI head no acute infarct with moderate chronic small vessel ischemia, small remote cerebellar infarct.  CTA head/neck with mixed plaque proximal right internal carotid artery resulting in less than 50% stenosis, minimal plaque left carotid system, mild stenosis origin left vertebral artery and V2 segment, otherwise patent vertebral arteries, intracranial atherosclerotic disease resulting in mild/moderate stenosis left supraclinoid ICA.  TTE with LVEF 60 to 65%, 21 mm [facility] Ease valve present in the aortic position, no intra-atrial shunt noted.  LDL 33, hemoglobin A1c 5.3.  Neurology unclear if this was TIA versus syncopal event from A-fib with RVR.  Neurology now signed off.  Aspirin 81 mg p.o. daily.  Outpatient follow-up stroke clinic 4-6 weeks. ? ?Essential hypertension ?--Metoprolol tartrate '100mg'$  p.o. twice daily ?--Cardizem drip as above ? ?S/P AVR ?TTE with 21 mm [facility] Ease pericardial valve present in the aortic position.  Continue outpatient follow-up with cardiology. ? ?CKD (chronic kidney disease) stage 3, GFR 30-59 ml/min (HCC) ?Creatinine 0.9-1.1.,  Stable ?--Avoid nephrotoxins, renally dose all medications ? ?CLL (chronic lymphocytic leukemia) ([place]) ?Follows with medical oncology outpatient, Dr. Alvy Bimler. On treatment with  [place].  Continue outpatient follow-up. ? ?PAD (peripheral artery disease) ([place]) ?On atorvastatin 10 mg p.o. daily  at home.  Currently holding due to elevated LFTs on admission.  LDL 33. ?--Likely resume atorvastatin on discharge if LFTs continue to trend down/normalized ? ?Hypomagnesemia ?Magnesium 1.6 this morning, will replete. ?--Repeat magnesium level in a.m. ? ?Hypokalemia ?Potassium 3.3 this morning, will replete. ?--Repeat electrolytes in a.m. ? ?E-coli UTI ?Urine culture with greater than 100 K E. coli.  Completed 5-day course initially with ceftriaxone followed by Keflex. ? ?Pleural effusion ?Chest x-ray with bilateral pleural effusions.  Patient underwent ultrasound-guided right thoracentesis by IR on 03/31/2020 yielding 600 cc of yellow fluid.  No malignant cells on cytology.  No organisms seen on Gram stain. ?--Pleural fluid culture: No growth x 4 days ?--Oxygenating well on room air ? ?Physical deconditioning ?--Continue PT/OT efforts while inpatient ?--Pending SNF placement ? ?Hyponatremia ?Sodium 122 on admission.  Likely secondary to hypovolemic hyponatremia. ?--Na 122>>>134>137 ?--BMP in the a.m. ? ?Elevated LFTs ?Etiology likely secondary to mild shock liver in the setting of A-fib with RVR and likely poor perfusion.  Right upper quadrant ultrasound with mildly increased echogenicity of the liver consistent with fatty infiltration, no obvious focal liver lesions, right-sided pleural effusion, prior cholecystectomy. ?--AST 185>118>61>33 ?--ALT 338>251>168>78 ?--Tbili 1.4>1.5>1.1 ?--Holding atorvastatin ?--Repeat LFTs in a.m. ? ?Thrombocytopenia ([place]) ?Platelet count 135, stable.  Follows with hematology outpatient.  On aspirin. ? ? ? ? ? ?DVT prophylaxis: apixaban (ELIQUIS) tablet 2.5 mg Start: 04/01/21 1030 ?apixaban (ELIQUIS) tablet 2.5 mg  ?  Code Status: Full Code ?Family Communication: Attempted to update patient's daughter-in-law Lorie via telephone this afternoon; unsuccessful; voicemail left ? ?Disposition Plan:  ?Level of care: Progressive ?Status is: Inpatient ?Remains inpatient appropriate because:  Pending SNF placement and transition of Cardizem drip to oral ?  ? ?Consultants:  ?Neurology -signed off ?Cardiology ? ?Procedures:  ?TTE ? ?Antimicrobials:  ?Ceftriaxone 3/25 - 3/28 ?Keflex 3/28>> ? ? ?Subjective: ?Patient seen examined bedside, resting comfortably.  Returned from TEE with finding of left atrial appendage clot and cardioversion was canceled.  Remains on Cardizem drip.  Awaiting further cardiology recommendations.  Likely anticipate discharge to SNF tomorrow.  No other complaints or concerns at this time.  Denies headache, no dizziness, no chest pain, no palpitations, no shortness of breath, no abdominal pain, no weakness, no fever/chills/night sweats, no nausea/vomiting/diarrhea, no paresthesias.  No acute events overnight per nursing staff. ? ?Objective: ?Vitals:  ? 04/04/21 0850 04/04/21 0855 04/04/21 0900 04/04/21 1054  ?BP: (!) 158/65  (!) 166/65   ?Pulse: 87 72 71 97  ?Resp: (!) '23 13 14   '$ ?Temp:    97.8 ?F (36.6 ?C)  ?TempSrc:    Oral  ?SpO2: 100% 100% 99%   ?Weight:      ?Height:      ? ? ?Intake/Output Summary (Last 24 hours) at 04/04/2021 1320 ?Last data filed at 04/04/2021 8563 ?Gross per 24 hour  ?Intake 450 ml  ?Output 250 ml  ?Net 200 ml  ? ?Filed Weights  ? 03/31/21 0200 04/01/21 0316  ?Weight: 44.2 kg 42.7 kg  ? ? ?Examination: ? ?Physical Exam: ?GEN: NAD, alert and oriented x 3, wd/wn ?HEENT: NCAT, PERRL, EOMI, sclera clear, MMM ?PULM: CTAB w/o wheezes/crackles, normal respiratory effort ?CV: Irregularly irregular rhythm, normal rate w/o M/G/R ?GI: abd soft, NTND, NABS, no R/G/M ?MSK: no peripheral edema, muscle strength globally intact 5/5 bilateral upper/lower extremities ?NEURO: CN II-XII intact, no focal deficits, sensation to light  touch intact ?PSYCH: normal mood/affect ?Integumentary: dry/intact, no rashes or wounds ? ? ? ?Data Reviewed: I have personally reviewed following labs and imaging studies ? ?CBC: ?Recent Labs  ?Lab 03/30/21 ?1841 03/30/21 ?2330 03/31/21 ?5208  04/02/21 ?0400 04/04/21 ?0427  ?WBC 12.8* 9.7 10.6* 6.1 5.1  ?NEUTROABS 7.6  --  8.4* 3.4 2.9  ?HGB 13.7 13.4 12.9 13.7 12.8  ?HCT 40.1 39.0 37.6 40.8 38.4  ?MCV 98.5 99.2 95.9 98.8 98.7  ?PLT 135* 125* PLATELET CLUMP

## 2021-04-04 NOTE — Anesthesia Preprocedure Evaluation (Addendum)
Anesthesia Evaluation  ?Patient identified by MRN, date of birth, ID band ?Patient awake ? ? ? ?Reviewed: ?Allergy & Precautions, NPO status , Patient's Chart, lab work & pertinent test results ? ?Airway ?Mallampati: I ? ? ? ? ? ? Dental ?no notable dental hx. ? ?  ?Pulmonary ?neg pulmonary ROS,  ?  ?Pulmonary exam normal ? ? ? ? ? ? ? Cardiovascular ?hypertension, + CAD and + CABG  ?Normal cardiovascular exam+ dysrhythmias Atrial Fibrillation + Valvular Problems/Murmurs  ?Rhythm:Irregular Rate:Normal ? ? ?  ?Neuro/Psych ?negative psych ROS  ? GI/Hepatic ?negative GI ROS, Neg liver ROS,   ?Endo/Other  ?negative endocrine ROS ? Renal/GU ?Renal InsufficiencyRenal disease  ?negative genitourinary ?  ?Musculoskeletal ? ? Abdominal ?Normal abdominal exam  (+)   ?Peds ? Hematology ?  ?Anesthesia Other Findings ?IMPRESSIONS  ? ? ??1. Left ventricular ejection fraction, by estimation, is 60 to 65%. The  ?left ventricle has normal function. The left ventricle has no regional  ?wall motion abnormalities. There is moderate left ventricular hypertrophy.  ?Left ventricular diastolic  ?parameters are indeterminate.  ??2. Right ventricular systolic function is mildly reduced. The right  ?ventricular size is normal. There is mildly elevated pulmonary artery  ?systolic pressure. The estimated right ventricular systolic pressure is  ?01.6 mmHg.  ??3. The mitral valve is degenerative. Mild mitral valve regurgitation.  ?Mild to moderate mitral stenosis. Moderate mitral annular calcification.  ?MG 66mHg at 114 bpm.  ??4. Tricuspid valve regurgitation is moderate.  ??5. There is a 21 mm E[employer] Lotsvalve present in the aortic  ?position  ??? ?Aortic valve regurgitation is trivial. Echo findings are consistent  ?with normal structure and function of the aortic valve prosthesis. MG  ?948mg, unchanged from prior echo. Vmax 1.9 m/s, MG 109m46m, DI 0.42  ??6. The inferior vena cava is normal in size with  <50% respiratory  ?variability, suggesting right atrial pressure of 8 mmHg.  ? ?FINDINGS ? Reproductive/Obstetrics ? ?  ? ? ? ? ? ? ? ? ? ? ? ? ? ?  ?  ? ? ? ? ? ? ? ?Anesthesia Physical ?Anesthesia Plan ? ?ASA: 3 ? ?Anesthesia Plan: MAC  ? ?Post-op Pain Management: Minimal or no pain anticipated  ? ?Induction: Intravenous ? ?PONV Risk Score and Plan: Propofol infusion and TIVA ? ?Airway Management Planned: Natural Airway and Mask ? ?Additional Equipment: None ? ?Intra-op Plan:  ? ?Post-operative Plan:  ? ?Informed Consent: I have reviewed the patients History and Physical, chart, labs and discussed the procedure including the risks, benefits and alternatives for the proposed anesthesia with the patient or authorized representative who has indicated his/her understanding and acceptance.  ? ? ? ?Dental advisory given ? ?Plan Discussed with: CRNA ? ?Anesthesia Plan Comments:   ? ? ? ? ? ? ?Anesthesia Quick Evaluation ? ?

## 2021-04-04 NOTE — Progress Notes (Signed)
?  Echocardiogram ?Echocardiogram Transesophageal has been performed. ? ?Nicole Mercado ?04/04/2021, 9:00 AM ?

## 2021-04-04 NOTE — Assessment & Plan Note (Addendum)
Repleted during hospitalization.  Potassium 4.4 at time of discharge. ?

## 2021-04-04 NOTE — Anesthesia Postprocedure Evaluation (Signed)
Anesthesia Post Note ? ?Patient: Nicole Mercado ? ?Procedure(s) Performed: TRANSESOPHAGEAL ECHOCARDIOGRAM (TEE) ?CARDIOVERSION ? ?  ? ?Patient location during evaluation: Endoscopy ?Anesthesia Type: MAC ?Level of consciousness: awake ?Pain management: pain level controlled ?Vital Signs Assessment: post-procedure vital signs reviewed and stable ?Respiratory status: spontaneous breathing ?Cardiovascular status: stable ?Postop Assessment: no apparent nausea or vomiting ?Anesthetic complications: no ? ? ?No notable events documented. ? ?Last Vitals:  ?Vitals:  ? 04/04/21 0830 04/04/21 0835  ?BP: (!) 130/55   ?Pulse: 79 81  ?Resp: 15 14  ?Temp:    ?SpO2: 100% 100%  ?  ?Last Pain:  ?Vitals:  ? 04/04/21 0827  ?TempSrc: Temporal  ?PainSc: 0-No pain  ? ? ?  ?  ?  ?  ?  ?  ? ?Huston Foley ? ? ? ? ?

## 2021-04-04 NOTE — Transfer of Care (Signed)
Immediate Anesthesia Transfer of Care Note ? ?Patient: Nicole Mercado ? ?Procedure(s) Performed: TRANSESOPHAGEAL ECHOCARDIOGRAM (TEE) ?CARDIOVERSION ? ?Patient Location: PACU ? ?Anesthesia Type:MAC ? ?Level of Consciousness: awake, alert  and patient cooperative ? ?Airway & Oxygen Therapy: Patient Spontanous Breathing ? ?Post-op Assessment: Report given to RN and Post -op Vital signs reviewed and stable ? ?Post vital signs: Reviewed and stable ? ?Last Vitals:  ?Vitals Value Taken Time  ?BP 130/55 04/04/21 0827  ?Temp    ?Pulse 58 04/04/21 0827  ?Resp 17 04/04/21 0827  ?SpO2 100 % 04/04/21 0827  ?Vitals shown include unvalidated device data. ? ?Last Pain:  ?Vitals:  ? 04/04/21 0718  ?TempSrc: Temporal  ?PainSc: 0-No pain  ?   ? ?  ? ?Complications: No notable events documented. ?

## 2021-04-04 NOTE — Interval H&P Note (Signed)
History and Physical Interval Note: ? ?04/04/2021 ?7:25 AM ? ?Nicole Mercado  has presented today for surgery, with the diagnosis of AFIB.  The various methods of treatment have been discussed with the patient and family. After consideration of risks, benefits and other options for treatment, the patient has consented to  Procedure(s): ?TRANSESOPHAGEAL ECHOCARDIOGRAM (TEE) ([name]) ?CARDIOVERSION ([name]) as a surgical intervention.  The patient's history has been reviewed, patient examined, no change in status, stable for surgery.  I have reviewed the patient's chart and labs.  Questions were answered to the patient's satisfaction.   ? ? ?Godfrey Pick Lesha Jager ? ? ?

## 2021-04-04 NOTE — Progress Notes (Addendum)
? ?Progress Note ? ?Patient Name: Nicole Mercado ?Date of Encounter: 04/04/2021 ? ?[facility] Cardiologist: Sherren Mocha, MD  ? ?Subjective  ? ?Patient denies dizziness   No SOB  No palpitations   No CP  ? ?Inpatient Medications  ?  ?Scheduled Meds: ? apixaban  2.5 mg Oral BID  ? aspirin  81 mg Oral Daily  ? metoprolol tartrate  100 mg Oral BID  ? potassium chloride  30 mEq Oral Q3H  ? ?Continuous Infusions: ? diltiazem (CARDIZEM) infusion 5 mg/hr (04/04/21 0741)  ? ?PRN Meds: ?acetaminophen  ? ?Vital Signs  ?  ?Vitals:  ? 04/04/21 0850 04/04/21 0855 04/04/21 0900 04/04/21 1054  ?BP: (!) 158/65  (!) 166/65   ?Pulse: 87 72 71 97  ?Resp: (!) '23 13 14   '$ ?Temp:    97.8 ?F (36.6 ?C)  ?TempSrc:    Oral  ?SpO2: 100% 100% 99%   ?Weight:      ?Height:      ? ? ?Intake/Output Summary (Last 24 hours) at 04/04/2021 1330 ?Last data filed at 04/04/2021 4944 ?Gross per 24 hour  ?Intake 450 ml  ?Output 250 ml  ?Net 200 ml  ? ? ?  04/01/2021  ?  3:16 AM 03/31/2021  ?  2:00 AM 03/25/2021  ?  2:38 PM  ?Last 3 Weights  ?Weight (lbs) 94 lb 2.2 oz 97 lb 7.1 oz 98 lb  ?Weight (kg) 42.7 kg 44.2 kg 44.453 kg  ?   ? ?Telemetry  ?  ?Afib 80s    - Personally Reviewed ? ?ECG  ?  ? - Personally Reviewed ? ?Physical Exam  ? ?GEN: No acute distress.   ?Neck: No JVD ?Cardiac: Irreg irreg   No mrumurs    ?Respiratory: Clear to auscultation bilaterally. ?GI: Soft, nontender, non-distended  ?MS: No edema; No deformity. ?Neuro:  Nonfocal  ?Psych: Normal affect  ? ?Labs  ?  ?High Sensitivity Troponin:   ?Recent Labs  ?Lab 03/19/21 ?1505 03/19/21 ?1705 03/30/21 ?1841 03/30/21 ?2045  ?TROPONINIHS 25* 26* 18* 15  ?   ?Chemistry ?Recent Labs  ?Lab 03/31/21 ?0648 03/31/21 ?1157 04/01/21 ?1002 04/02/21 ?0400 04/04/21 ?0427  ?NA 128*   < > 130* 134* 137  ?K 3.5   < > 3.3* 4.4 3.3*  ?CL 89*   < > 91* 97* 103  ?CO2 27   < > '29 26 24  '$ ?GLUCOSE 88   < > 121* 90 96  ?BUN 17   < > '17 18 17  '$ ?CREATININE 1.04*   < > 1.34* 1.13* 1.02*  ?CALCIUM 9.3   < > 9.0 9.2 8.5*   ?MG  --   --  1.5*  --  1.6*  ?PROT 4.9*  --  5.0*  --  4.6*  ?ALBUMIN 3.0*  --  2.9*  --  2.8*  ?AST 118*  --  61*  --  33  ?ALT 251*  --  168*  --  78*  ?ALKPHOS 99  --  85  --  74  ?BILITOT 1.5*  --  1.1  --  1.0  ?GFRNONAA 54*   < > 40* 49* 56*  ?ANIONGAP 12   < > '10 11 10  '$ ? < > = values in this interval not displayed.  ?  ?Lipids  ?Recent Labs  ?Lab 03/30/21 ?2331  ?CHOL 96  ?TRIG 133  ?HDL 36*  ?[place] 33  ?CHOLHDL 2.7  ?  ?Hematology ?Recent Labs  ?Lab 03/31/21 ?9675 04/02/21 ?  0400 04/04/21 ?0427  ?WBC 10.6* 6.1 5.1  ?RBC 3.92 4.13 3.89  ?HGB 12.9 13.7 12.8  ?HCT 37.6 40.8 38.4  ?MCV 95.9 98.8 98.7  ?MCH 32.9 33.2 32.9  ?MCHC 34.3 33.6 33.3  ?RDW 13.8 13.9 13.7  ?PLT PLATELET CLUMPS NOTED ON SMEAR, UNABLE TO ESTIMATE 110* 109*  ? ?Thyroid  ?Recent Labs  ?Lab 03/30/21 ?2330  ?TSH 2.503  ?  ?BNP ?Recent Labs  ?Lab 03/30/21 ?2011  ?BNP 910.5*  ?  ?DDimer No results for input(s): DDIMER in the last 168 hours.  ? ?Radiology  ?  ?ECHO TEE ? ?Result Date: 04/04/2021 ?   TRANSESOPHOGEAL ECHO REPORT   Patient Name:   Nicole Mercado Date of Exam: 04/04/2021 Medical Rec #:  [id number]     Height:       60.0 in Accession #:    [id number]    Weight:       94.1 lb Date of Birth:  [birth year]    BSA:          1.355 m? Patient Age:    81 years      BP:           166/98 mmHg Patient Gender: F             HR:           80 bpm. Exam Location:  Inpatient Procedure: Transesophageal Echo Indications:     atrial fibrillation  History:         Patient has prior history of Echocardiogram examinations, most                  recent 04/01/2021. Chronic kidney disease.                  Aortic Valve: 21 mm [facility] Ease valve is present in the                  aortic position.  Sonographer:     Johny Chess RDCS Referring Phys:  [place] Diagnosing Phys: Godfrey Pick Tobb DO PROCEDURE: After discussion of the risks and benefits of a TEE, an informed consent was obtained from the patient. The transesophogeal probe was passed  without difficulty through the esophogus of the patient. Imaged were obtained with the patient in a left lateral decubitus position. Local oropharyngeal anesthetic was provided with Cetacaine. Sedation performed by different physician. The patient was monitored while under deep sedation. Anesthestetic sedation was provided intravenously by Anesthesiology: '106mg'$  of Propofol. The patient developed no complications during the procedure. IMPRESSIONS  1. Left ventricular ejection fraction, by estimation, is 55 to 60%. The left ventricle has normal function. Left ventricular diastolic function could not be evaluated.  2. Right ventricular systolic function is normal. The right ventricular size is normal.  3. A left atrial/left atrial appendage thrombus was detected. The LAA emptying velocity was 35 cm/s.  4. Moderate pleural effusion in the left lateral region.  5. The mitral valve is normal in structure. Mild to moderate mitral valve regurgitation. No evidence of mitral stenosis.  6. The aortic valve has been repaired/replaced. Aortic valve regurgitation is mild. There is a 21 mm [employer] valve present in the aortic position. FINDINGS  Left Ventricle: Left ventricular ejection fraction, by estimation, is 55 to 60%. The left ventricle has normal function. The left ventricular internal cavity size was normal in size. There is no left ventricular hypertrophy. Left ventricular diastolic function could not be evaluated.  Right Ventricle: The right ventricular size is normal. Right vetricular wall thickness was not assessed. Right ventricular systolic function is normal. Left Atrium: Left atrial size was not well visualized. A left atrial/left atrial appendage thrombus was detected. The LAA emptying velocity was 35 cm/s. Right Atrium: Right atrial size was normal in size. Pericardium: There is no evidence of pericardial effusion. Mitral Valve: The mitral valve is normal in structure. Mild to moderate mitral valve  regurgitation. No evidence of mitral valve stenosis. Tricuspid Valve: The tricuspid valve is normal in structure. Tricuspid valve regurgitation is mild . No evidence of tricuspid stenosis. Aortic Valve: The aortic valve has been repaired/replaced. Aortic valve regurgitation is mild. There is a 21 mm [employer] valve present in the aortic position. Pulmonic Valve: The pulmonic valve was not well visualized. Pulmonic valve regurgitation is trivial. No evidence of pulmonic stenosis. Aorta: The aortic root and ascending aorta are structurally normal, with no evidence of dilitation. There is minimal (Grade I) layered plaque. Pulmonary Artery: The pulmonary artery is not well seen. Venous: The left upper pulmonary vein and left lower pulmonary vein are normal. A normal flow pattern is recorded from the left upper pulmonary vein. IAS/Shunts: No atrial level shunt detected by color flow Doppler. Additional Comments: There is a moderate pleural effusion in the left lateral region. Mild ascites is present. [name] DO Electronically signed by [name] DO Signature Date/Time: 04/04/2021/9:34:45 AM    Final    ? ?Cardiac Studies  ? ?Relevant CV Studies: ? ?TEE   04/04/21 ? ?Left ventricular ejection fraction, by estimation, is 55 to 60%. The left ventricle has ?normal function. Left ventricular diastolic function could not be evaluated. ?1. ?2. Right ventricular systolic function is normal. The right ventricular size is normal. ?A left atrial/left atrial appendage thrombus was detected. The LAA emptying velocity ?was 35 cm/s. ?3. ?Mild prolaspe of the posterior mitral leaflet. There is a small to moderate pericardial ?effusion which is localized at lateral and apical borders of the left ventricle. Moderate ?pleural effusion in the left lateral region. ?4. ?Mild to moderate mitral regurgitation with 3 seperate regugitant jets. No evidence of ?mitral stenosis. ?5. ?6. The aortic valve has been repaired/replaced.  Aortic valve regurgitation is mild. There ?Final (Updated) ?Page 1 of 2 ?is a 21 mm [employer] valve present in the aortic position. ? ?  ?Stress test in 10/22  ?"The study is normal. The study is low risk

## 2021-04-05 ENCOUNTER — Encounter (HOSPITAL_COMMUNITY): Payer: Self-pay | Admitting: Cardiology

## 2021-04-05 LAB — CULTURE, BODY FLUID W GRAM STAIN -BOTTLE: Culture: NO GROWTH

## 2021-04-05 LAB — COMPREHENSIVE METABOLIC PANEL
ALT: 70 U/L — ABNORMAL HIGH (ref 0–44)
AST: 37 U/L (ref 15–41)
Albumin: 2.7 g/dL — ABNORMAL LOW (ref 3.5–5.0)
Alkaline Phosphatase: 75 U/L (ref 38–126)
Anion gap: 6 (ref 5–15)
BUN: 14 mg/dL (ref 8–23)
CO2: 24 mmol/L (ref 22–32)
Calcium: 8.5 mg/dL — ABNORMAL LOW (ref 8.9–10.3)
Chloride: 102 mmol/L (ref 98–111)
Creatinine, Ser: 1.05 mg/dL — ABNORMAL HIGH (ref 0.44–1.00)
GFR, Estimated: 54 mL/min — ABNORMAL LOW (ref >60)
Glucose, Bld: 105 mg/dL — ABNORMAL HIGH (ref 70–99)
Potassium: 4.4 mmol/L (ref 3.5–5.1)
Sodium: 132 mmol/L — ABNORMAL LOW (ref 135–145)
Total Bilirubin: 1 mg/dL (ref 0.3–1.2)
Total Protein: 4.5 g/dL — ABNORMAL LOW (ref 6.5–8.1)

## 2021-04-05 LAB — MAGNESIUM: Magnesium: 1.9 mg/dL (ref 1.7–2.4)

## 2021-04-05 MED ORDER — DILTIAZEM HCL ER COATED BEADS 180 MG PO CP24
180.0000 mg | ORAL_CAPSULE | Freq: Every day | ORAL | Status: DC
  Administered 2021-04-05: 180 mg via ORAL
  Filled 2021-04-05: qty 1

## 2021-04-05 MED ORDER — APIXABAN 5 MG PO TABS
5.0000 mg | ORAL_TABLET | Freq: Two times a day (BID) | ORAL | Status: DC
Start: 2021-04-05 — End: 2021-04-24

## 2021-04-05 MED ORDER — DILTIAZEM HCL ER COATED BEADS 180 MG PO CP24: 180.0000 mg | ORAL_CAPSULE | Freq: Every day | ORAL | Status: DC

## 2021-04-05 NOTE — Discharge Summary (Addendum)
?Physician Discharge Summary  ?Nicole Mercado JJO:[id number] DOB: 21-Jan-1940 DOA: 03/30/2021 ? ?PCP: Kristie Cowman, MD ? ?Admit date: 03/30/2021 ?Discharge date: 04/05/2021 ? ?Admitted From: Home ?Disposition: [facility] ? ?Recommendations for Outpatient Follow-up:  ?Follow up with PCP in 1-2 weeks ?Follow-up with cardiology in 1-2 weeks ?Outpatient follow-up with stroke clinic 4-6 weeks ?Started on Cardizem CD 180 mg p.o. daily ?Eliquis increased to 5 mg p.o. twice daily due to left atrium appendage clot noted on TEE ?Please obtain CMP/CBC in one week ? ? ?Discharge Condition: Stable ?CODE STATUS: Full code ?Diet recommendation: Heart healthy diet ? ?History of present illness: ? 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  Noted by friend around 5 PM on date of admission that she was slurring her words and not making any sense.  Last known normal was roughly 4 PM. ? ?In the ED, temperature 97.6 ?F, BP 165/90, RR 13, SPO2 93% on room air.  Sodium 122, potassium 5.1, chloride 87, CO2 24, glucose 118, BUN 24, creatinine 0.98, AST 185, ALT 338, total bilirubin 1.4.  WBC 12.8, hemoglobin 13.7, platelets 135.  INR 1.4.  COVID-19 PCR negative.  Influenza A/B PCR negative.  Urinalysis with moderate leukocytes, positive nitrite, many bacteria, greater than 50 WBCs.  Patient initially had some difficulty speaking, but gradually improved back to her baseline.  CT head without contrast negative for acute findings.  CTA head/neck negative for large vessel occlusion.  Neurology was consulted.  Patient was also noted to be in A-fib with RVR and started on a Cardizem drip.  Hospitalist service consulted for further evaluation and management of slurred speech and A-fib with RVR.   Patient was transferred to [facility] for further evaluation and treatment. ? ?Hospital course: ? ?Assessment and Plan: ?* Persistent atrial fibrillation with RVR ([place]) ?On arrival to the ED, patient was noted to be in A-fib with RVR.  Recently diagnosed with initial plan of outpatient cardioversion.  TTE with LVEF 55-60%, normal LV function, no regional wall abnormalities, moderate LVH, diastolic primers cannot be evaluated.  TEE 3/30 with left atrial appendage clot noted and cardioversion was canceled.  Initially started on a Cardizem drip which was transitioned to Cardizem CD 180 mg p.o. daily.  Continue metoprolol tartrate 100 mg p.o. twice daily.  Eliquis 5 mg p.o. twice daily for anticoagulation.  Will need to continue anticoagulation with no attempts of cardioversion until 4 weeks after.  Outpatient follow-up with cardiology. ? ? ?TIA (transient ischemic attack) ?Patient initially presenting with slurred speech/aphasia.  Resolved in the ED.  Neurology was consulted and followed during initial hospital course.  CT head without contrast with no acute hemorrhage/infarct, mild global parenchymal volume loss and moderate chronic white matter microangiopathy, small remote lacunar infarcts right basal ganglia and left insula.  MRI head no acute infarct with moderate chronic small vessel ischemia, small remote cerebellar infarct.  CTA head/neck with mixed plaque proximal right internal carotid artery resulting in less than 50% stenosis, minimal plaque left carotid system, mild stenosis origin left vertebral artery and V2 segment, otherwise patent vertebral arteries, intracranial atherosclerotic disease resulting in mild/moderate stenosis left supraclinoid ICA.  TTE with LVEF 60 to 65%, 21 mm [facility] Ease valve present in the aortic position, no intra-atrial shunt  noted.  LDL 33, hemoglobin A1c 5.3.  Neurology unclear if this was TIA versus syncopal event from A-fib with RVR.  Neurology now signed off.   Aspirin 81 mg p.o. daily.  Outpatient follow-up stroke clinic 4-6 weeks. ? ?Essential hypertension ?Metoprolol tartrate '100mg'$  p.o. twice daily, Cardizem CD 180 mg p.o. daily. ? ?S/P AVR ?TTE with 21 mm [facility] Ease pericardial valve present in the aortic position.  Continue outpatient follow-up with cardiology. ? ?CKD (chronic kidney disease) stage 3, GFR 30-59 ml/min (HCC) ?Baseline creatinine 0.9-1.1.,  Stable, 1.05 at time of discharge ? ?CLL (chronic lymphocytic leukemia) ([place]) ?Follows with medical oncology outpatient, Dr. Alvy Bimler. On treatment with [facility].  Continue outpatient follow-up. ? ?PAD (peripheral artery disease) ([place]) ?On atorvastatin 10 mg p.o. daily at home.  ? ?Hypomagnesemia ?Repleted during hospitalization.  Magnesium 1.9 at time of discharge. ? ?Hypokalemia ?Repleted during hospitalization.  Potassium 4.4 at time of discharge. ? ?E-coli UTI ?Urine culture with greater than 100 K E. coli.  Completed 5-day course initially with ceftriaxone followed by Keflex. ? ?Pleural effusion ?Chest x-ray with bilateral pleural effusions.  Patient underwent ultrasound-guided right thoracentesis by IR on 03/31/2020 yielding 600 cc of yellow fluid.  No malignant cells on cytology.  No organisms seen on Gram stain.  Pleural fluid culture with no growth. ? ?Physical deconditioning ?Discharging to SNF for further rehabilitation. ? ?Hyponatremia ?Sodium 122 on admission.  Likely secondary to hypovolemic hyponatremia.  Improved to 132 at time of discharge.  CMP 1 week. ? ?Elevated LFTs ?Etiology likely secondary to mild shock liver in the setting of A-fib with RVR and likely poor perfusion.  Right upper quadrant ultrasound with mildly increased echogenicity of the liver consistent with fatty infiltration, no obvious focal liver lesions, right-sided pleural effusion, prior cholecystectomy.  Patient's LFTs were monitored during hospital course and improved at time of discharge.  Recommend repeat CMP 1 week.   Okay to resume atorvastatin after discharge. ? ?Thrombocytopenia ([place]) ?Platelet count 135, stable.  Follows with hematology outpatient.  On aspirin. ? ? ? ? ? ? ?Discharge Diagnoses:  ?Principal Problem: ?  Persistent atrial fibrillation with RVR ([place]) ?Active Problems: ?  TIA (transient ischemic attack) ?  Essential hypertension ?  S/P AVR ?  CKD (chronic kidney disease) stage 3, GFR 30-59 ml/min (HCC) ?  CLL (chronic lymphocytic leukemia) ([place]) ?  PAD (peripheral artery disease) ([place]) ?  Thrombocytopenia ([place]) ?  Elevated LFTs ?  Hyponatremia ?  Physical deconditioning ?  Pleural effusion ?  E-coli UTI ?  Hypokalemia ?  Hypomagnesemia ? ? ? ?Discharge Instructions ? ?Discharge Instructions   ? ? Call MD for:  difficulty breathing, headache or visual disturbances   Complete by: As directed ?  ? Call MD for:  extreme fatigue   Complete by: As directed ?  ? Call MD for:  persistant dizziness or light-headedness   Complete by: As directed ?  ? Call MD for:  persistant nausea and vomiting   Complete by: As directed ?  ? Call MD for:  severe uncontrolled pain   Complete by: As directed ?  ? Call MD for:  temperature >100.4   Complete by: As directed ?  ? Diet - low sodium heart healthy   Complete by: As directed ?  ? Increase activity slowly   Complete by: As directed ?  ? ?  ? ?Allergies as of 04/05/2021   ?No Known Allergies ?  ? ?  ?Medication List  ?  ? ?  TAKE these medications   ? ?acetaminophen 325 MG tablet ?Commonly known as: TYLENOL ?Take 650 mg by mouth every 6 (six) hours as needed for mild pain or headache. ?  ?apixaban 5 MG Tabs tablet ?Commonly known as: ELIQUIS ?Take 1 tablet (5 mg total) by mouth 2 (two) times daily. ?What changed:  ?medication strength ?how much to take ?  ?aspirin 81 MG tablet ?Take 81 mg by mouth daily. ?  ?atorvastatin 10 MG tablet ?Commonly known as: LIPITOR ?TAKE 1 TABLET BY MOUTH ONCE DAILY AT  6  IN  THE  EVENING ?What changed: See the new instructions. ?  ?Calcium  Carb-Cholecalciferol 500-125 MG-UNIT Tabs ?Take 1 tablet by mouth daily. ?  ?diltiazem 180 MG 24 hr capsule ?Commonly known as: CARDIZEM CD ?Take 1 capsule (180 mg total) by mouth daily. ?  ?HM MULTIVITAMIN ADULT GU

## 2021-04-05 NOTE — Progress Notes (Signed)
Patient transported to [facility] via [place]. Patient alert and oriented x4. Belongings packed and sent with patient. IV removed. Tele removed. Patient is not in distress not does she complain of anything at all.  ?

## 2021-04-05 NOTE — Plan of Care (Signed)
°  Problem: Education: °Goal: Knowledge of disease or condition will improve °Outcome: Adequate for Discharge °Goal: Knowledge of secondary prevention will improve (SELECT ALL) °Outcome: Adequate for Discharge °Goal: Knowledge of patient specific risk factors will improve (INDIVIDUALIZE FOR PATIENT) °Outcome: Adequate for Discharge °Goal: Individualized Educational Video(s) °Outcome: Adequate for Discharge °  °Problem: Coping: °Goal: Will verbalize positive feelings about self °Outcome: Adequate for Discharge °Goal: Will identify appropriate support needs °Outcome: Adequate for Discharge °  °Problem: Health Behavior/Discharge Planning: °Goal: Ability to manage health-related needs will improve °Outcome: Adequate for Discharge °  °Problem: Self-Care: °Goal: Ability to participate in self-care as condition permits will improve °Outcome: Adequate for Discharge °Goal: Verbalization of feelings and concerns over difficulty with self-care will improve °Outcome: Adequate for Discharge °Goal: Ability to communicate needs accurately will improve °Outcome: Adequate for Discharge °  °Problem: Nutrition: °Goal: Risk of aspiration will decrease °Outcome: Adequate for Discharge °Goal: Dietary intake will improve °Outcome: Adequate for Discharge °  °Problem: Ischemic Stroke/TIA Tissue Perfusion: °Goal: Complications of ischemic stroke/TIA will be minimized °Outcome: Adequate for Discharge °  °Problem: Education: °Goal: Knowledge of General Education information will improve °Description: Including pain rating scale, medication(s)/side effects and non-pharmacologic comfort measures °Outcome: Adequate for Discharge °  °Problem: Health Behavior/Discharge Planning: °Goal: Ability to manage health-related needs will improve °Outcome: Adequate for Discharge °  °Problem: Clinical Measurements: °Goal: Ability to maintain clinical measurements within normal limits will improve °Outcome: Adequate for Discharge °Goal: Will remain free from  infection °Outcome: Adequate for Discharge °Goal: Diagnostic test results will improve °Outcome: Adequate for Discharge °Goal: Respiratory complications will improve °Outcome: Adequate for Discharge °Goal: Cardiovascular complication will be avoided °Outcome: Adequate for Discharge °  °Problem: Activity: °Goal: Risk for activity intolerance will decrease °Outcome: Adequate for Discharge °  °Problem: Nutrition: °Goal: Adequate nutrition will be maintained °Outcome: Adequate for Discharge °  °Problem: Coping: °Goal: Level of anxiety will decrease °Outcome: Adequate for Discharge °  °Problem: Elimination: °Goal: Will not experience complications related to bowel motility °Outcome: Adequate for Discharge °Goal: Will not experience complications related to urinary retention °Outcome: Adequate for Discharge °  °Problem: Pain Managment: °Goal: General experience of comfort will improve °Outcome: Adequate for Discharge °  °Problem: Safety: °Goal: Ability to remain free from injury will improve °Outcome: Adequate for Discharge °  °Problem: Skin Integrity: °Goal: Risk for impaired skin integrity will decrease °Outcome: Adequate for Discharge °  °

## 2021-04-05 NOTE — TOC Transition Note (Signed)
Transition of Care (TOC) - CM/SW Discharge Note ? ? ?Patient Details  ?Name: Nicole Mercado ?MRN: [id number] ?Date of Birth: 1940-08-09 ? ?Transition of Care (TOC) CM/SW Contact:  ?Pollie Friar, RN ?Phone Number: ?04/05/2021, 11:05 AM ? ? ?Clinical Narrative:    ?Patient is discharging to [facility] today. [facility] will provide needed transport. Bedside RN updated and d/c packet at the desk.  ? ?Number for report: [phone] ? ? ?Final next level of care: [place] ?Barriers to Discharge: No Barriers Identified ? ? ?Patient Goals and CMS Choice ?  ?CMS Medicare.gov Compare Post Acute Care list provided to:: Patient Represenative (must comment) ?Choice offered to / list presented to : Adult Children ? ?Discharge Placement ?PASRR number recieved: 04/03/21 ?           ?Patient [facility] bed [facility]: [facility] ?Patient to be transferred to facility by: [facility] ?Name of family member notified: [name] ?Patient and family notified of of transfer: 04/05/21 ? ?Discharge Plan and Services ?  ?  ?           ?  ?  ?  ?  ?  ?  ?  ?  ?  ?  ? ?Social Determinants of Health (SDOH) Interventions ?  ? ? ?Readmission Risk Interventions ?   ? View : No data to display.  ?  ?  ?  ? ? ? ? ? ?

## 2021-04-16 ENCOUNTER — Telehealth: Payer: Self-pay | Admitting: Interventional Cardiology

## 2021-04-16 NOTE — Telephone Encounter (Signed)
Returned call to Pt's son. ? ?Son's wife answered phone.  She is not on DPR, but she states all the inpatient doctor's have been talking to her. ? ?Advised family will need to update DPR at next appointment. ? ?Advised upcoming procedure has been cancelled.  Advised to keep appt on 04/24/21. ?

## 2021-04-16 NOTE — Telephone Encounter (Signed)
New Message: ? ? ? ? ?Patient was just discharged from the hospital . They need to know does she still need the procedure that is scheduled for 04-22-21? She was unable to have it while she was in the hospital, due to a blood clot. ?

## 2021-04-22 ENCOUNTER — Encounter (HOSPITAL_COMMUNITY): Admission: RE | Payer: Self-pay | Source: Home / Self Care

## 2021-04-22 ENCOUNTER — Ambulatory Visit (HOSPITAL_COMMUNITY): Admission: RE | Admit: 2021-04-22 | Payer: Medicare Other | Source: Home / Self Care | Admitting: Internal Medicine

## 2021-04-22 SURGERY — CARDIOVERSION
Anesthesia: General

## 2021-04-23 NOTE — Progress Notes (Signed)
?Cardiology Office Note:   ? ?Date:  04/24/2021  ? ?ID:  Nicole Mercado, DOB 1940-04-20, MRN [id number] ? ?PCP:  Kristie Cowman, MD  ?[place] Cardiologist:  Sherren Mocha, MD  ?[facility] Electrophysiologist:  None  ? ?Chief Complaint: hospital follow up ? ?History of Present Illness:   ? ?Nicole Mercado is a 81 y.o. female with a hx of CAD with CABG and AVR in 01/2014, HTN, HLD, carotid disease with Lt CEA 2013 CLL, chronic thrombocytopenia, persistent atrial fibrillation, recurrent syncope, orthostatic hypotension seen for hospital follow up.  ? ?Hx of severe AS and CAD leading to CABG/bioprosthetic AVR in 2016. She had some post-op atrial fibrillation around that time. She was seen in 08/2020 for episode of syncope that occurred without warning at a grocery store. 2D echo 08/2020 EF >75% with hyperdynamic function, no RWMA, normal RV with mildly elevated PASP, mild LAE, mild MR, mild-moderate TR, mild AI, minimal change in AV gradients from 2020. Monitor 08/2020 showed NSR, possible atrial fib with very low burden of 3 hours during monitoring period (baseline artifact made interpretation difficult), no high grade heart block/pauses, occasional PVCs/supraventricular beats, no sustained ventricular arrhythmias. Given her CLL, low platelet count, and low AFib burden, the decision was made not to anticoagulate but she was referred to EP to consider ILR. Do not see this was scheduled; appt desk screen states patient refusal. The patient does not recall ever getting a call about this. She also had a carotid duplex 10/2020 (1-39% BICA with occluded RECA) as well as LE duplex (30-49% proximal CFA, ostial SFA distal SFA, 50-74% stenosis found at proximal CFA and mid SFA, 30-49% stenosis at the proximal popliteal artery) but ABIs were normal. Referral to [facility] was placed but do not see this occurred either. Stress test 10/2020 (done for the syncope) was low risk with a defect felt c/w artifact. ? ?Hospitalized with  syncope X 2 ( 03/19/21 to 03/21/21.)  syncope thought to be due to dehydration and orthostatic hypotension.  She was found to have a fib with RVR.  Stated on IV dilt and at discharge on eliquis and po BB  plan for 30 day monitor amlodipine held.  On follow up her metoprolol increased from 75 BID to 100 BID.    Echo 03/20/21 with EF 60-65%, mildly reduced RVF with mildly elevated PASP, mild MR, moderate TR, bioprosthetic AVR with normal function.   ? ?Again admitted 3/25-3/31 for possible TIA and persistent atrial fibrillation with rapid ventricular rate.  Initially presented with slurred speech/aphasia which was resolving ER. MRI head no acute infarct with moderate chronic small vessel ischemia, small remote cerebellar infarct. CTA head/neck with mixed plaque proximal right internal carotid artery resulting in less than 50% stenosis, minimal plaque left carotid system, mild stenosis origin left vertebral artery and V2 segment, otherwise patent vertebral arteries, intracranial atherosclerotic disease resulting in mild/moderate stenosis left supraclinoid ICA.  TTE with LVEF 60 to 65%, 21 mm [facility] Ease valve present in the aortic position, no intra-atrial shunt noted.  LDL 33, hemoglobin A1c 5.3.  Neurology unclear if this was TIA versus syncopal event from A-fib with RVR.  Patient underwent TEE showing left atrial appendage clot.  No anticoagulation was performed. Recommend continue anticoagulation with no attempts of cardioversion until 4 weeks after. ? ?Here today for follow-up. Patient is doing well. Feel sleep and tired after taking morning meds but this improves as days goes by. At night, she takes medications and goes to sleep.  Intermittent palpitations. She denies chest pain, dyspnea, dizziness, orthopnea, PND, or LE edema.  ? ?Past Medical History:  ?Diagnosis Date  ? Aortic stenosis   ? a. Echocardiogram (01/11/14):  Mild LVH, EF 60-65%, Gr 1 DD, possible bicuspid AV, severe AS (mean 61 mmHg, peak 104  mmHg), mild MVP of post leaflet, mild MR, PASP 33 mmHg.;  b. 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 Arthritis   ? Atrial fibrillation ([place])   ? post op after CABG+AVR >> Amiodarone  ? CAD (coronary artery disease)   ? a. LHC (01/13/14):  dLM 70 extending into oLAD, mLAD 40-50, pD1 80, pD2 70, ostial/prox OM1 70 >> CABG (L-LAD, S-OM1, S-D1, S-D2)  // Myoview 10/22: Fixed defect anterolateral wall consistent with artifact, EF 71, no ischemia, low risk  ? Carotid artery occlusion   ? a. s/p L CEA 2013;  b.  Carotid US (1/16):  Bilateral ICA 1-39% // Carotid US 10/22: Bilateral ICA 1-39  ? CLL (chronic lymphocytic leukemia) ([place])   ? slow leukemia---Dr  Murinson  ? H/O exercise stress test   ? NSSTT  ? Hx of cardiovascular stress test   ? a. Nuclear stress test That (4/13): Normal perfusion, EF 74%  ? Hx of echocardiogram   ? Echo (2/16):  Mild LVH, EF 60-65%, no RWMA, Gr 2 DD, AVR ok (mean 9 mmHg), trivial AI, mild MR, mild LAE, PASP 40 mmHg  ? Hypertension   ? PAD (peripheral artery disease) ([place])   ? LE Arterial US 10/22: R - pCFA, oSFA, dSFA 30-49; L - pCFA + mSFA 50-74, pPop 30-49 >> referred to [facility]  ? Pancreatitis   ? h/o  ? Thrombocytopenia ([place]) 11/19/2010  ? ? ?Past Surgical History:  ?Procedure Laterality Date  ? ABDOMINAL HYSTERECTOMY    ? AORTIC VALVE REPLACEMENT [name] 01/17/2014  ? Procedure: AORTIC VALVE REPLACEMENT (AVR);  Surgeon: Gaye Pollack, MD;  Location: [place];  Service: Open Heart Surgery;  Laterality: [name];  ? APPENDECTOMY    ? CARDIOVERSION [name] 04/04/2021  ? Procedure: CARDIOVERSION;  Surgeon: [name], DO;  Location: [place];  Service: Cardiovascular;  Laterality: [name];  ? CAROTID ENDARTERECTOMY  06/09/11  ? LEFT  cea  ? CESAREAN SECTION    ? FIVE  ? CHOLECYSTECTOMY    ? CORONARY ARTERY BYPASS GRAFT [name] 01/17/2014  ? Procedure: CORONARY ARTERY BYPASS GRAFTING (CABG)TIMES FOUR USING LEFT INTERNAL MAMMARY ARTERY AND RIGHT SAPHENOUS VEIN HARVESTED ENDOSCOPICALLY;  Surgeon: Gaye Pollack, MD;  Location:  [place] OR;  Service: Open Heart Surgery;  Laterality: [name];  ? ENDARTERECTOMY  06/09/2011  ? Procedure: ENDARTERECTOMY CAROTID;  Surgeon: Rosetta Posner, MD;  Location: [facility] OR;  Service: Vascular;  Laterality: Left;  left carotid endarterectomy with patch angioplasty  ? LEFT AND RIGHT HEART CATHETERIZATION WITH CORONARY ANGIOGRAM [name] 01/13/2014  ? Procedure: LEFT AND RIGHT HEART CATHETERIZATION WITH CORONARY ANGIOGRAM;  Surgeon: Jettie Booze, MD;  Location: [facility] CATH LAB;  Service: Cardiovascular;  Laterality: [name];  ? TEE WITHOUT CARDIOVERSION [name] 01/17/2014  ? Procedure: TRANSESOPHAGEAL ECHOCARDIOGRAM (TEE);  Surgeon: Gaye Pollack, MD;  Location: [place];  Service: Open Heart Surgery;  Laterality: [name];  ? TEE WITHOUT CARDIOVERSION [name] 04/04/2021  ? Procedure: TRANSESOPHAGEAL ECHOCARDIOGRAM (TEE);  Surgeon: [name], DO;  Location: [facility] ENDOSCOPY;  Service: Cardiovascular;  Laterality: [name];  ? TONSILLECTOMY    ? ? ?Current Medications: ?Current Meds  ?Medication Sig  ? acetaminophen (TYLENOL) 325 MG tablet Take 650 mg by mouth every  6 (six) hours as needed for mild pain or headache.   ? aspirin 81 MG tablet Take 81 mg by mouth daily.  ? atorvastatin (LIPITOR) 10 MG tablet TAKE 1 TABLET BY MOUTH ONCE DAILY AT  6  IN  THE  EVENING  ? Calcium Carb-Cholecalciferol 500-125 MG-UNIT TABS Take 1 tablet by mouth daily.  ? metoprolol tartrate (LOPRESSOR) 100 MG tablet Take 1 tablet (100 mg total) by mouth 2 (two) times daily.  ? Multiple Vitamins-Minerals (HM MULTIVITAMIN ADULT GUMMY PO) Take 1 tablet by mouth daily.  ? oxybutynin (DITROPAN-XL) 10 MG 24 hr tablet Take 10 mg by mouth daily.  ? [DISCONTINUED] apixaban (ELIQUIS) 5 MG TABS tablet Take 1 tablet (5 mg total) by mouth 2 (two) times daily.  ? [DISCONTINUED] diltiazem (CARDIZEM CD) 180 MG 24 hr capsule Take 1 capsule (180 mg total) by mouth daily.  ?  ? ?Allergies:   Patient has no known allergies.  ? ?Social History  ? ?Socioeconomic History  ? Marital status: Widowed  ?   Spouse name: Not on file  ? Number of children: Not on file  ? Years of education: Not on file  ? Highest education level: Not on file  ?Occupational History  ? Not on file  ?Tobacco Use  ? Smoking status: Never

## 2021-04-23 NOTE — H&P (View-Only) (Signed)
?Cardiology Office Note:   ? ?Date:  04/24/2021  ? ?ID:  Nicole Mercado, DOB [birth year], MRN [id number] ? ?PCP:  Kristie Cowman, MD  ?[place] Cardiologist:  Sherren Mocha, MD  ?[facility] Electrophysiologist:  None  ? ?Chief Complaint: hospital follow up ? ?History of Present Illness:   ? ?Nicole Mercado is a 81 y.o. female with a hx of CAD with CABG and AVR in 01/2014, HTN, HLD, carotid disease with Lt CEA 2013 CLL, chronic thrombocytopenia, persistent atrial fibrillation, recurrent syncope, orthostatic hypotension seen for hospital follow up.  ? ?Hx of severe AS and CAD leading to CABG/bioprosthetic AVR in 2016. She had some post-op atrial fibrillation around that time. She was seen in 08/2020 for episode of syncope that occurred without warning at a grocery store. 2D echo 08/2020 EF >75% with hyperdynamic function, no RWMA, normal RV with mildly elevated PASP, mild LAE, mild MR, mild-moderate TR, mild AI, minimal change in AV gradients from 2020. Monitor 08/2020 showed NSR, possible atrial fib with very low burden of 3 hours during monitoring period (baseline artifact made interpretation difficult), no high grade heart block/pauses, occasional PVCs/supraventricular beats, no sustained ventricular arrhythmias. Given her CLL, low platelet count, and low AFib burden, the decision was made not to anticoagulate but she was referred to EP to consider ILR. Do not see this was scheduled; appt desk screen states patient refusal. The patient does not recall ever getting a call about this. She also had a carotid duplex 10/2020 (1-39% BICA with occluded RECA) as well as LE duplex (30-49% proximal CFA, ostial SFA distal SFA, 50-74% stenosis found at proximal CFA and mid SFA, 30-49% stenosis at the proximal popliteal artery) but ABIs were normal. Referral to VVS was placed but do not see this occurred either. Stress test 10/2020 (done for the syncope) was low risk with a defect felt c/w artifact. ? ?Hospitalized with  syncope X 2 ( 03/19/21 to 03/21/21.)  syncope thought to be due to dehydration and orthostatic hypotension.  She was found to have a fib with RVR.  Stated on IV dilt and at discharge on eliquis and po BB  plan for 30 day monitor amlodipine held.  On follow up her metoprolol increased from 75 BID to 100 BID.    Echo 03/20/21 with EF 60-65%, mildly reduced RVF with mildly elevated PASP, mild MR, moderate TR, bioprosthetic AVR with normal function.   ? ?Again admitted 3/25-3/31 for possible TIA and persistent atrial fibrillation with rapid ventricular rate.  Initially presented with slurred speech/aphasia which was resolving ER. MRI head no acute infarct with moderate chronic small vessel ischemia, small remote cerebellar infarct. CTA head/neck with mixed plaque proximal right internal carotid artery resulting in less than 50% stenosis, minimal plaque left carotid system, mild stenosis origin left vertebral artery and V2 segment, otherwise patent vertebral arteries, intracranial atherosclerotic disease resulting in mild/moderate stenosis left supraclinoid ICA.  TTE with LVEF 60 to 65%, 21 mm [facility] Ease valve present in the aortic position, no intra-atrial shunt noted.  LDL 33, hemoglobin A1c 5.3.  Neurology unclear if this was TIA versus syncopal event from A-fib with RVR.  Patient underwent TEE showing left atrial appendage clot.  No anticoagulation was performed. Recommend continue anticoagulation with no attempts of cardioversion until 4 weeks after. ? ?Here today for follow-up. Patient is doing well. Feel sleep and tired after taking morning meds but this improves as days goes by. At night, she takes medications and goes to sleep.  Intermittent palpitations. She denies chest pain, dyspnea, dizziness, orthopnea, PND, or LE edema.  ? ?Past Medical History:  ?Diagnosis Date  ? Aortic stenosis   ? a. Echocardiogram (01/11/14):  Mild LVH, EF 60-65%, Gr 1 DD, possible bicuspid AV, severe AS (mean 61 mmHg, peak 104  mmHg), mild MVP of post leaflet, mild MR, PASP 33 mmHg.;  b. 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 Arthritis   ? Atrial fibrillation ([place])   ? post op after CABG+AVR >> Amiodarone  ? CAD (coronary artery disease)   ? a. LHC (01/13/14):  dLM 70 extending into oLAD, mLAD 40-50, pD1 80, pD2 70, ostial/prox OM1 70 >> CABG (L-LAD, S-OM1, S-D1, S-D2)  // Myoview 10/22: Fixed defect anterolateral wall consistent with artifact, EF 71, no ischemia, low risk  ? Carotid artery occlusion   ? a. s/p L CEA 2013;  b.  Carotid US (1/16):  Bilateral ICA 1-39% // Carotid US 10/22: Bilateral ICA 1-39  ? CLL (chronic lymphocytic leukemia) ([place])   ? slow leukemia---Dr  Murinson  ? H/O exercise stress test   ? NSSTT  ? Hx of cardiovascular stress test   ? a. Nuclear stress test That (4/13): Normal perfusion, EF 74%  ? Hx of echocardiogram   ? Echo (2/16):  Mild LVH, EF 60-65%, no RWMA, Gr 2 DD, AVR ok (mean 9 mmHg), trivial AI, mild MR, mild LAE, PASP 40 mmHg  ? Hypertension   ? PAD (peripheral artery disease) ([place])   ? LE Arterial US 10/22: R - pCFA, oSFA, dSFA 30-49; L - pCFA + mSFA 50-74, pPop 30-49 >> referred to VVS  ? Pancreatitis   ? h/o  ? Thrombocytopenia ([place]) 11/19/2010  ? ? ?Past Surgical History:  ?Procedure Laterality Date  ? ABDOMINAL HYSTERECTOMY    ? AORTIC VALVE REPLACEMENT [name] 01/17/2014  ? Procedure: AORTIC VALVE REPLACEMENT (AVR);  Surgeon: Gaye Pollack, MD;  Location: [facility];  Service: Open Heart Surgery;  Laterality: [name];  ? APPENDECTOMY    ? CARDIOVERSION [name] 04/04/2021  ? Procedure: CARDIOVERSION;  Surgeon: [name], DO;  Location: [facility];  Service: Cardiovascular;  Laterality: [name];  ? CAROTID ENDARTERECTOMY  06/09/11  ? LEFT  cea  ? CESAREAN SECTION    ? FIVE  ? CHOLECYSTECTOMY    ? CORONARY ARTERY BYPASS GRAFT [name] 01/17/2014  ? Procedure: CORONARY ARTERY BYPASS GRAFTING (CABG)TIMES FOUR USING LEFT INTERNAL MAMMARY ARTERY AND RIGHT SAPHENOUS VEIN HARVESTED ENDOSCOPICALLY;  Surgeon: Gaye Pollack, MD;  Location:  [place] OR;  Service: Open Heart Surgery;  Laterality: [name];  ? ENDARTERECTOMY  06/09/2011  ? Procedure: ENDARTERECTOMY CAROTID;  Surgeon: Rosetta Posner, MD;  Location: [facility] OR;  Service: Vascular;  Laterality: Left;  left carotid endarterectomy with patch angioplasty  ? LEFT AND RIGHT HEART CATHETERIZATION WITH CORONARY ANGIOGRAM [name] 01/13/2014  ? Procedure: LEFT AND RIGHT HEART CATHETERIZATION WITH CORONARY ANGIOGRAM;  Surgeon: Jettie Booze, MD;  Location: [facility] C[facility] LAB;  Service: Cardiovascular;  Laterality: [name];  ? TEE WITHOUT CARDIOVERSION [name] 01/17/2014  ? Procedure: TRANSESOPHAGEAL ECHOCARDIOGRAM (TEE);  Surgeon: Gaye Pollack, MD;  Location: [place];  Service: Open Heart Surgery;  Laterality: [name];  ? TEE WITHOUT CARDIOVERSION [name] 04/04/2021  ? Procedure: TRANSESOPHAGEAL ECHOCARDIOGRAM (TEE);  Surgeon: [name], DO;  Location: [facility] ENDOSCOPY;  Service: Cardiovascular;  Laterality: [name];  ? TONSILLECTOMY    ? ? ?Current Medications: ?Current Meds  ?Medication Sig  ? acetaminophen (TYLENOL) 325 MG tablet Take 650 mg by mouth every  6 (six) hours as needed for mild pain or headache.   ? aspirin 81 MG tablet Take 81 mg by mouth daily.  ? atorvastatin (LIPITOR) 10 MG tablet TAKE 1 TABLET BY MOUTH ONCE DAILY AT  6  IN  THE  EVENING  ? Calcium Carb-Cholecalciferol 500-125 MG-UNIT TABS Take 1 tablet by mouth daily.  ? metoprolol tartrate (LOPRESSOR) 100 MG tablet Take 1 tablet (100 mg total) by mouth 2 (two) times daily.  ? Multiple Vitamins-Minerals (HM MULTIVITAMIN ADULT GUMMY PO) Take 1 tablet by mouth daily.  ? oxybutynin (DITROPAN-XL) 10 MG 24 hr tablet Take 10 mg by mouth daily.  ? [DISCONTINUED] apixaban (ELIQUIS) 5 MG TABS tablet Take 1 tablet (5 mg total) by mouth 2 (two) times daily.  ? [DISCONTINUED] diltiazem (CARDIZEM CD) 180 MG 24 hr capsule Take 1 capsule (180 mg total) by mouth daily.  ?  ? ?Allergies:   Patient has no known allergies.  ? ?Social History  ? ?Socioeconomic History  ? Marital status: Widowed  ?   Spouse name: Not on file  ? Number of children: Not on file  ? Years of education: Not on file  ? Highest education level: Not on file  ?Occupational History  ? Not on file  ?Tobacco Use  ? Smoking status: Never

## 2021-04-24 ENCOUNTER — Encounter: Payer: Self-pay | Admitting: Physician Assistant

## 2021-04-24 ENCOUNTER — Ambulatory Visit: Payer: Medicare Other | Admitting: Physician Assistant

## 2021-04-24 VITALS — BP 142/92 | HR 107 | Ht 60.0 in | Wt 96.0 lb

## 2021-04-24 DIAGNOSIS — I25110 Atherosclerotic heart disease of native coronary artery with unstable angina pectoris: Secondary | ICD-10-CM

## 2021-04-24 DIAGNOSIS — I739 Peripheral vascular disease, unspecified: Secondary | ICD-10-CM | POA: Diagnosis not present

## 2021-04-24 DIAGNOSIS — Z952 Presence of prosthetic heart valve: Secondary | ICD-10-CM | POA: Diagnosis not present

## 2021-04-24 DIAGNOSIS — R55 Syncope and collapse: Secondary | ICD-10-CM

## 2021-04-24 DIAGNOSIS — I4819 Other persistent atrial fibrillation: Secondary | ICD-10-CM | POA: Diagnosis not present

## 2021-04-24 MED ORDER — DILTIAZEM HCL ER COATED BEADS 180 MG PO CP24
180.0000 mg | ORAL_CAPSULE | Freq: Every day | ORAL | 2 refills | Status: AC
Start: 2021-04-24 — End: ?

## 2021-04-24 MED ORDER — APIXABAN 5 MG PO TABS: 5.0000 mg | ORAL_TABLET | Freq: Two times a day (BID) | ORAL | 2 refills | Status: DC

## 2021-04-24 NOTE — Patient Instructions (Addendum)
Medication Instructions:  ?Your physician recommends that you continue on your current medications as directed. Please refer to the Current Medication list given to you today. ?*If you need a refill on your cardiac medications before your next appointment, please call your pharmacy* ? ? ?Lab Work: ?Your physician recommends that you return for lab work in: Friday (05/03/21) OR Monday (05/06/21) -BMET, BNP ?If you have labs (blood work) drawn today and your tests are completely normal, you will receive your results only by: ?MyChart Message (if you have MyChart) OR ?A paper copy in the mail ?If you have any lab test that is abnormal or we need to change your treatment, we will call you to review the results. ? ? ?Testing/Procedures: ?Your physician has recommended that you have a Cardioversion (DCCV). Electrical Cardioversion uses a jolt of electricity to your heart either through paddles or wired patches attached to your chest. This is a controlled, usually prescheduled, procedure. Defibrillation is done under light anesthesia in the hospital, and you usually go home the day of the procedure. This is done to get your heart back into a normal rhythm. You are not awake for the procedure. Please see the instruction sheet given to you today. ?SEE INSTRUCTIONS BELOW ? ? ?Follow-Up: ?At [facility], you and your health needs are our priority.  As part of our continuing mission to provide you with exceptional heart care, we have created designated Provider Care Teams.  These Care Teams include your primary Cardiologist (physician) and Advanced Practice Providers (APPs -  Physician Assistants and Nurse Practitioners) who all work together to provide you with the care you need, when you need it. ? ?We recommend signing up for the patient portal called "MyChart".  Sign up information is provided on this After Visit Summary.  MyChart is used to connect with patients for Virtual Visits (Telemedicine).  Patients are able to view  lab/test results, encounter notes, upcoming appointments, etc.  Non-urgent messages can be sent to your provider as well.   ?To learn more about what you can do with MyChart, go to [email or web address].   ? ?Your next appointment:   ?2 week(s) ? ?The format for your next appointment:   ?In Person ? ?Provider:   ?Robbie Lis, PA ? ? ? ?Other Instructions ? ? ? ? ?CARDIOVERSION INSTRUCTIONS  ? ? ?Dear Nicole Mercado  ? ?You are scheduled for a Cardioversion. Cardioversion on Wednesday May 08, 2021 with Dr. [name].  Please arrive at the [facility] (Main Entrance A) at [facility]: [address] [place], [place] [address] at 10:30 am. (1 hour prior to procedure unless lab work is needed; if lab work is needed arrive 1.5 hours ahead) ? ?DIET:  ?Nothing to eat or drink after midnight except a sip of water with medications (see medication instructions below) ? ?FYI: ? For your safety, and to allow [place] to monitor your vital signs accurately during the surgery/procedure we request that if you have artificial nails, gel coating, SNS etc. Please have those removed prior to your surgery/procedure. Not having the nail coverings /polish removed may result in cancellation or delay of your procedure. ? ? ?Medication Instructions: ?NO MEDICATION TO HOLD  ?Continue your anticoagulant: Eliquis ?You will need to continue your anticoagulant after your procedure until you  are told by your  ?Provider that it is safe to stop ? ? ?Labs: ?Come to the lab at [facility] between the hours of 8:00 am and 4:30 pm. Dennis Bast  do not have to be fasting. ?OR  ?Your lab work will be done at the hospital prior to your procedure - you will need to arrive 1 ? hours ahead of your procedure ? ?You must have a responsible person to drive you home and stay in the waiting area during your procedure. Failure to do so could result in cancellation. ? ?Interior and spatial designer cards. ? ?*Special Note: Every effort is made to have your procedure done  on time. Occasionally there are emergencies that occur at the hospital that may cause delays. Please be patient if a delay does occur.  ? ? ?Important Information About Sugar ? ? ? ? ?  ?

## 2021-05-01 ENCOUNTER — Encounter (HOSPITAL_COMMUNITY): Payer: Self-pay | Admitting: Cardiology

## 2021-05-01 NOTE — Progress Notes (Signed)
Attempted to obtain medical history via telephone, unable to reach at this time. I left a voicemail to return pre surgical testing department's phone call.  

## 2021-05-03 ENCOUNTER — Other Ambulatory Visit: Payer: Medicare Other

## 2021-05-06 ENCOUNTER — Other Ambulatory Visit: Payer: Medicare Other | Admitting: *Deleted

## 2021-05-06 ENCOUNTER — Telehealth: Payer: Self-pay

## 2021-05-06 DIAGNOSIS — Z01810 Encounter for preprocedural cardiovascular examination: Secondary | ICD-10-CM

## 2021-05-06 LAB — CBC
Hematocrit: 46.6 % (ref 34.0–46.6)
Hemoglobin: 15.7 g/dL (ref 11.1–15.9)
MCH: 31.5 pg (ref 26.6–33.0)
MCHC: 33.7 g/dL (ref 31.5–35.7)
MCV: 93 fL (ref 79–97)
Platelets: 137 10*3/uL — ABNORMAL LOW (ref 150–450)
RBC: 4.99 x10E6/uL (ref 3.77–5.28)
RDW: 13.7 % (ref 11.7–15.4)
WBC: 8.8 10*3/uL (ref 3.4–10.8)

## 2021-05-06 LAB — BASIC METABOLIC PANEL
BUN/Creatinine Ratio: 16 (ref 12–28)
BUN: 20 mg/dL (ref 8–27)
CO2: 29 mmol/L (ref 20–29)
Calcium: 10.9 mg/dL — ABNORMAL HIGH (ref 8.7–10.3)
Chloride: 100 mmol/L (ref 96–106)
Creatinine, Ser: 1.28 mg/dL — ABNORMAL HIGH (ref 0.57–1.00)
Glucose: 115 mg/dL — ABNORMAL HIGH (ref 70–99)
Potassium: 4.6 mmol/L (ref 3.5–5.2)
Sodium: 137 mmol/L (ref 134–144)
eGFR: 42 mL/min/{1.73_m2} — ABNORMAL LOW (ref >59)

## 2021-05-06 NOTE — Telephone Encounter (Signed)
Lab orders placed for Cardioversion 05/08/21.  ?

## 2021-05-08 ENCOUNTER — Ambulatory Visit (HOSPITAL_BASED_OUTPATIENT_CLINIC_OR_DEPARTMENT_OTHER): Payer: Medicare Other | Admitting: Certified Registered Nurse Anesthetist

## 2021-05-08 ENCOUNTER — Encounter (HOSPITAL_COMMUNITY): Payer: Self-pay | Admitting: Cardiology

## 2021-05-08 ENCOUNTER — Ambulatory Visit (HOSPITAL_COMMUNITY)
Admission: RE | Admit: 2021-05-08 | Discharge: 2021-05-08 | Disposition: A | Discharge status: Home / Self Care | Payer: Medicare Other | Attending: Cardiology | Admitting: Cardiology

## 2021-05-08 ENCOUNTER — Other Ambulatory Visit: Payer: Self-pay

## 2021-05-08 ENCOUNTER — Encounter (HOSPITAL_COMMUNITY)
Admission: RE | Disposition: A | Discharge status: Home / Self Care | Payer: Self-pay | Source: Home / Self Care | Attending: Cardiology

## 2021-05-08 ENCOUNTER — Ambulatory Visit (HOSPITAL_COMMUNITY): Payer: Medicare Other | Admitting: Certified Registered Nurse Anesthetist

## 2021-05-08 DIAGNOSIS — Z952 Presence of prosthetic heart valve: Secondary | ICD-10-CM | POA: Insufficient documentation

## 2021-05-08 DIAGNOSIS — I10 Essential (primary) hypertension: Secondary | ICD-10-CM | POA: Diagnosis not present

## 2021-05-08 DIAGNOSIS — I4891 Unspecified atrial fibrillation: Secondary | ICD-10-CM | POA: Diagnosis not present

## 2021-05-08 DIAGNOSIS — J90 Pleural effusion, not elsewhere classified: Secondary | ICD-10-CM | POA: Diagnosis not present

## 2021-05-08 DIAGNOSIS — I951 Orthostatic hypotension: Secondary | ICD-10-CM | POA: Insufficient documentation

## 2021-05-08 DIAGNOSIS — I4819 Other persistent atrial fibrillation: Secondary | ICD-10-CM | POA: Diagnosis present

## 2021-05-08 DIAGNOSIS — I2510 Atherosclerotic heart disease of native coronary artery without angina pectoris: Secondary | ICD-10-CM

## 2021-05-08 DIAGNOSIS — D696 Thrombocytopenia, unspecified: Secondary | ICD-10-CM | POA: Diagnosis not present

## 2021-05-08 DIAGNOSIS — E785 Hyperlipidemia, unspecified: Secondary | ICD-10-CM | POA: Diagnosis not present

## 2021-05-08 DIAGNOSIS — I3139 Other pericardial effusion (noninflammatory): Secondary | ICD-10-CM | POA: Insufficient documentation

## 2021-05-08 DIAGNOSIS — I080 Rheumatic disorders of both mitral and aortic valves: Secondary | ICD-10-CM | POA: Insufficient documentation

## 2021-05-08 DIAGNOSIS — Z951 Presence of aortocoronary bypass graft: Secondary | ICD-10-CM | POA: Insufficient documentation

## 2021-05-08 DIAGNOSIS — C9110 Chronic lymphocytic leukemia of B-cell type not having achieved remission: Secondary | ICD-10-CM | POA: Insufficient documentation

## 2021-05-08 DIAGNOSIS — N289 Disorder of kidney and ureter, unspecified: Secondary | ICD-10-CM | POA: Diagnosis not present

## 2021-05-08 HISTORY — PX: CARDIOVERSION: SHX1299

## 2021-05-08 SURGERY — CARDIOVERSION
Anesthesia: General

## 2021-05-08 MED ORDER — PROPOFOL 10 MG/ML IV BOLUS
INTRAVENOUS | Status: DC | PRN
  Administered 2021-05-08: 40 mg via INTRAVENOUS

## 2021-05-08 MED ORDER — LIDOCAINE 2% (20 MG/ML) 5 ML SYRINGE
INTRAMUSCULAR | Status: DC | PRN
  Administered 2021-05-08: 40 mg via INTRAVENOUS

## 2021-05-08 MED ORDER — SODIUM CHLORIDE 0.9 % IV SOLN: INTRAVENOUS | Status: DC | PRN

## 2021-05-08 NOTE — Discharge Instructions (Signed)

## 2021-05-08 NOTE — Anesthesia Procedure Notes (Signed)
Procedure Name: General with mask airway ?Date/Time: 05/08/2021 10:26 AM ?Performed by: Colin Benton, CRNA ?Pre-anesthesia Checklist: Patient identified, Timeout performed, Emergency Drugs available, Suction available and Patient being monitored ?Patient Re-evaluated:Patient Re-evaluated prior to induction ?Oxygen Delivery Method: Ambu bag ?Preoxygenation: Pre-oxygenation with 100% oxygen ?Induction Type: IV induction ?Placement Confirmation: positive ETCO2 ?Dental Injury: Teeth and Oropharynx as per pre-operative assessment  ? ? ? ? ?

## 2021-05-08 NOTE — Interval H&P Note (Signed)
History and Physical Interval Note: ? ?05/08/2021 ?10:00 AM ? ?Nicole Mercado  has presented today for surgery, with the diagnosis of AFIB.  The various methods of treatment have been discussed with the patient and family. After consideration of risks, benefits and other options for treatment, the patient has consented to  Procedure(s): ?CARDIOVERSION ([name]) as a surgical intervention.  The patient's history has been reviewed, patient examined, no change in status, stable for surgery.  I have reviewed the patient's chart and labs.  Questions were answered to the patient's satisfaction.   ? ? ?Godfrey Pick Peja Allender ? ? ?

## 2021-05-08 NOTE — CV Procedure (Signed)
? ?  Electrical Cardioversion Procedure Note ?Nicole Mercado ?[id number] ?July 26, 1940 ? ?Procedure: Electrical Cardioversion ?Indications:  Atrial Fibrillation ? ?Time Out: Verified patient identification, verified procedure,medications/allergies/relevent history reviewed, required imaging and test results available.  Performed ? ?Procedure Details ? ?The patient signed informed consent.   The patient was NPO past midnight. Has had therapeutic anticoagulation with  Eliquis greater than 3 weeks. The patient denies any interruption of anticoagulation.  Anesthesia was administered by Dr. Daiva Huge.  Adequate airway was maintained throughout and vital followed per protocol.  He was cardioverted x 1 with 200J of biphasic synchronized energy.  He converted to NSR.  There were no apparent complications.  The patient tolerated the procedure well and had normal neuro status and respiratory status post procedure with vitals stable as recorded elsewhere.   ? ? ?IMPRESSION: ? ?Successful cardioversion of atrial fibrillation ? ? ?Follow up:  We will arrange follow up with primary cardiologist.  He will continue on current medical therapy.  The patient advised to continue anticoagulation. ? ?[name] ?05/08/2021, 10:35 AM ?  ?

## 2021-05-08 NOTE — Anesthesia Postprocedure Evaluation (Signed)
Anesthesia Post Note ? ?Patient: Nicole Mercado ? ?Procedure(s) Performed: CARDIOVERSION ? ?  ? ?Patient location during evaluation: PACU ?Anesthesia Type: General ?Level of consciousness: awake and alert ?Pain management: pain level controlled ?Vital Signs Assessment: post-procedure vital signs reviewed and stable ?Respiratory status: spontaneous breathing, nonlabored ventilation and respiratory function stable ?Cardiovascular status: blood pressure returned to baseline ?Postop Assessment: no apparent nausea or vomiting ?Anesthetic complications: no ? ? ?No notable events documented. ? ?Last Vitals:  ?Vitals:  ? 05/08/21 1045 05/08/21 1055  ?BP: (!) 146/48 (!) 167/51  ?Pulse: (!) 49 (!) 55  ?Resp: (!) 22 (!) 24  ?Temp:    ?SpO2: 98% 94%  ?  ?Last Pain:  ?Vitals:  ? 05/08/21 1055  ?TempSrc:   ?PainSc: 0-No pain  ? ? ?  ?  ?  ?  ?  ?  ? ?Marthenia Rolling ? ? ? ? ?

## 2021-05-08 NOTE — Anesthesia Preprocedure Evaluation (Signed)
Anesthesia Evaluation  ?Patient identified by MRN, date of birth, ID band ?Patient awake ? ? ? ?Reviewed: ?Allergy & Precautions, NPO status , Patient's Chart, lab work & pertinent test results, reviewed documented beta blocker date and time  ? ?History of Anesthesia Complications ?Negative for: history of anesthetic complications ? ?Airway ?Mallampati: II ? ?TM Distance: >3 FB ?Neck ROM: Full ? ? ? Dental ?no notable dental hx. ? ?  ?Pulmonary ?neg pulmonary ROS,  ?  ?Pulmonary exam normal ? ? ? ? ? ? ? Cardiovascular ?hypertension, Pt. on medications and Pt. on home beta blockers ?+ CAD and + Peripheral Vascular Disease  ?Normal cardiovascular exam ? ?AS s/p AVR ?  ?Neuro/Psych ?TIAnegative psych ROS  ? GI/Hepatic ?negative GI ROS, Neg liver ROS,   ?Endo/Other  ?negative endocrine ROS ? Renal/GU ?Renal InsufficiencyRenal disease  ?negative genitourinary ?  ?Musculoskeletal ? ?(+) Arthritis ,  ? Abdominal ?  ?Peds ? Hematology ?Plts 137k   ?Anesthesia Other Findings ?Day of surgery medications reviewed with patient. ? Reproductive/Obstetrics ?negative OB ROS ? ?  ? ? ? ? ? ? ? ? ? ? ? ? ? ?  ?  ? ? ? ? ? ? ? ? ?Anesthesia Physical ?Anesthesia Plan ? ?ASA: 3 ? ?Anesthesia Plan: General  ? ?Post-op Pain Management: Minimal or no pain anticipated  ? ?Induction: Intravenous ? ?PONV Risk Score and Plan: Treatment may vary due to age or medical condition and Propofol infusion ? ?Airway Management Planned: Mask ? ?Additional Equipment: None ? ?Intra-op Plan:  ? ?Post-operative Plan:  ? ?Informed Consent: I have reviewed the patients History and Physical, chart, labs and discussed the procedure including the risks, benefits and alternatives for the proposed anesthesia with the patient or authorized representative who has indicated his/her understanding and acceptance.  ? ? ? ? ? ?Plan Discussed with: CRNA ? ?Anesthesia Plan Comments:   ? ? ? ? ? ? ?Anesthesia Quick Evaluation ? ?

## 2021-05-08 NOTE — Transfer of Care (Signed)
Immediate Anesthesia Transfer of Care Note ? ?Patient: Nicole Mercado ? ?Procedure(s) Performed: CARDIOVERSION ? ?Patient Location: Endoscopy Unit ? ?Anesthesia Type:General ? ?Level of Consciousness: drowsy ? ?Airway & Oxygen Therapy: Patient Spontanous Breathing ? ?Post-op Assessment: Report given to RN and Post -op Vital signs reviewed and stable ? ?Post vital signs: Reviewed and stable ? ?Last Vitals:  ?Vitals Value Taken Time  ?BP 158/42 1032 05/08/21  ?Temp    ?Pulse 55 1032 05/08/21  ?Resp 12 1032 05/08/21  ?SpO2 94 1032 05/08/21  ? ? ?Last Pain:  ?Vitals:  ? 05/08/21 0947  ?TempSrc: Temporal  ?PainSc: 0-No pain  ?   ? ?  ? ?Complications: No notable events documented. ?

## 2021-05-10 ENCOUNTER — Encounter (HOSPITAL_COMMUNITY): Payer: Self-pay | Admitting: Cardiology

## 2021-05-16 ENCOUNTER — Emergency Department (HOSPITAL_COMMUNITY): Payer: Medicare Other

## 2021-05-16 ENCOUNTER — Encounter (HOSPITAL_COMMUNITY): Payer: Self-pay

## 2021-05-16 ENCOUNTER — Inpatient Hospital Stay (HOSPITAL_COMMUNITY)
Admission: EM | Admit: 2021-05-16 | Discharge: 2021-05-21 | DRG: 069 | Disposition: A | Discharge status: Skilled Nursing Facility | Payer: Medicare Other | Attending: Infectious Diseases | Admitting: Infectious Diseases

## 2021-05-16 ENCOUNTER — Ambulatory Visit: Payer: Medicare Other | Admitting: Physician Assistant

## 2021-05-16 ENCOUNTER — Other Ambulatory Visit: Payer: Self-pay

## 2021-05-16 DIAGNOSIS — R2990 Unspecified symptoms and signs involving the nervous system: Secondary | ICD-10-CM

## 2021-05-16 DIAGNOSIS — I129 Hypertensive chronic kidney disease with stage 1 through stage 4 chronic kidney disease, or unspecified chronic kidney disease: Secondary | ICD-10-CM | POA: Diagnosis present

## 2021-05-16 DIAGNOSIS — Z90710 Acquired absence of both cervix and uterus: Secondary | ICD-10-CM

## 2021-05-16 DIAGNOSIS — Z8249 Family history of ischemic heart disease and other diseases of the circulatory system: Secondary | ICD-10-CM

## 2021-05-16 DIAGNOSIS — Z66 Do not resuscitate: Secondary | ICD-10-CM | POA: Diagnosis present

## 2021-05-16 DIAGNOSIS — Z951 Presence of aortocoronary bypass graft: Secondary | ICD-10-CM

## 2021-05-16 DIAGNOSIS — G459 Transient cerebral ischemic attack, unspecified: Principal | ICD-10-CM | POA: Diagnosis present

## 2021-05-16 DIAGNOSIS — Z856 Personal history of leukemia: Secondary | ICD-10-CM

## 2021-05-16 DIAGNOSIS — D696 Thrombocytopenia, unspecified: Secondary | ICD-10-CM | POA: Diagnosis present

## 2021-05-16 DIAGNOSIS — M1990 Unspecified osteoarthritis, unspecified site: Secondary | ICD-10-CM | POA: Diagnosis present

## 2021-05-16 DIAGNOSIS — I2510 Atherosclerotic heart disease of native coronary artery without angina pectoris: Secondary | ICD-10-CM | POA: Diagnosis present

## 2021-05-16 DIAGNOSIS — Z79899 Other long term (current) drug therapy: Secondary | ICD-10-CM

## 2021-05-16 DIAGNOSIS — Z823 Family history of stroke: Secondary | ICD-10-CM

## 2021-05-16 DIAGNOSIS — N1831 Chronic kidney disease, stage 3a: Secondary | ICD-10-CM | POA: Diagnosis present

## 2021-05-16 DIAGNOSIS — R4701 Aphasia: Secondary | ICD-10-CM | POA: Diagnosis not present

## 2021-05-16 DIAGNOSIS — E785 Hyperlipidemia, unspecified: Secondary | ICD-10-CM | POA: Diagnosis present

## 2021-05-16 DIAGNOSIS — I4891 Unspecified atrial fibrillation: Secondary | ICD-10-CM

## 2021-05-16 DIAGNOSIS — E875 Hyperkalemia: Secondary | ICD-10-CM | POA: Diagnosis not present

## 2021-05-16 DIAGNOSIS — I480 Paroxysmal atrial fibrillation: Secondary | ICD-10-CM | POA: Diagnosis present

## 2021-05-16 DIAGNOSIS — Z7982 Long term (current) use of aspirin: Secondary | ICD-10-CM

## 2021-05-16 DIAGNOSIS — I739 Peripheral vascular disease, unspecified: Secondary | ICD-10-CM | POA: Diagnosis present

## 2021-05-16 DIAGNOSIS — Z953 Presence of xenogenic heart valve: Secondary | ICD-10-CM

## 2021-05-16 DIAGNOSIS — I4819 Other persistent atrial fibrillation: Secondary | ICD-10-CM | POA: Diagnosis present

## 2021-05-16 DIAGNOSIS — I639 Cerebral infarction, unspecified: Secondary | ICD-10-CM | POA: Diagnosis present

## 2021-05-16 DIAGNOSIS — I4892 Unspecified atrial flutter: Secondary | ICD-10-CM | POA: Diagnosis present

## 2021-05-16 DIAGNOSIS — Z7901 Long term (current) use of anticoagulants: Secondary | ICD-10-CM

## 2021-05-16 LAB — DIFFERENTIAL
Abs Immature Granulocytes: 0.01 10*3/uL (ref 0.00–0.07)
Basophils Absolute: 0 10*3/uL (ref 0.0–0.1)
Basophils Relative: 1 %
Eosinophils Absolute: 0 10*3/uL (ref 0.0–0.5)
Eosinophils Relative: 0 %
Immature Granulocytes: 0 %
Lymphocytes Relative: 36 %
Lymphs Abs: 2.7 10*3/uL (ref 0.7–4.0)
Monocytes Absolute: 0.6 10*3/uL (ref 0.1–1.0)
Monocytes Relative: 8 %
Neutro Abs: 4.3 10*3/uL (ref 1.7–7.7)
Neutrophils Relative %: 55 %

## 2021-05-16 LAB — I-STAT CHEM 8, ED
BUN: 16 mg/dL (ref 8–23)
Calcium, Ion: 1.01 mmol/L — ABNORMAL LOW (ref 1.15–1.40)
Chloride: 104 mmol/L (ref 98–111)
Creatinine, Ser: 1.1 mg/dL — ABNORMAL HIGH (ref 0.44–1.00)
Glucose, Bld: 137 mg/dL — ABNORMAL HIGH (ref 70–99)
HCT: 40 % (ref 36.0–46.0)
Hemoglobin: 13.6 g/dL (ref 12.0–15.0)
Potassium: 3.7 mmol/L (ref 3.5–5.1)
Sodium: 136 mmol/L (ref 135–145)
TCO2: 23 mmol/L (ref 22–32)

## 2021-05-16 LAB — COMPREHENSIVE METABOLIC PANEL
ALT: 18 U/L (ref 0–44)
AST: 32 U/L (ref 15–41)
Albumin: 4 g/dL (ref 3.5–5.0)
Alkaline Phosphatase: 61 U/L (ref 38–126)
Anion gap: 12 (ref 5–15)
BUN: 15 mg/dL (ref 8–23)
CO2: 22 mmol/L (ref 22–32)
Calcium: 10.1 mg/dL (ref 8.9–10.3)
Chloride: 103 mmol/L (ref 98–111)
Creatinine, Ser: 1.25 mg/dL — ABNORMAL HIGH (ref 0.44–1.00)
GFR, Estimated: 44 mL/min — ABNORMAL LOW (ref >60)
Glucose, Bld: 163 mg/dL — ABNORMAL HIGH (ref 70–99)
Potassium: 3.8 mmol/L (ref 3.5–5.1)
Sodium: 137 mmol/L (ref 135–145)
Total Bilirubin: 2 mg/dL — ABNORMAL HIGH (ref 0.3–1.2)
Total Protein: 6.2 g/dL — ABNORMAL LOW (ref 6.5–8.1)

## 2021-05-16 LAB — URINALYSIS, ROUTINE W REFLEX MICROSCOPIC
Bilirubin Urine: NEGATIVE
Glucose, UA: NEGATIVE mg/dL
Hgb urine dipstick: NEGATIVE
Ketones, ur: 5 mg/dL — AB
Leukocytes,Ua: NEGATIVE
Nitrite: NEGATIVE
Protein, ur: NEGATIVE mg/dL
Specific Gravity, Urine: 1.034 — ABNORMAL HIGH (ref 1.005–1.030)
pH: 7 (ref 5.0–8.0)

## 2021-05-16 LAB — CBG MONITORING, ED: Glucose-Capillary: 153 mg/dL — ABNORMAL HIGH (ref 70–99)

## 2021-05-16 LAB — CBC
HCT: 42.1 % (ref 36.0–46.0)
Hemoglobin: 14.3 g/dL (ref 12.0–15.0)
MCH: 32.5 pg (ref 26.0–34.0)
MCHC: 34 g/dL (ref 30.0–36.0)
MCV: 95.7 fL (ref 80.0–100.0)
Platelets: 128 10*3/uL — ABNORMAL LOW (ref 150–400)
RBC: 4.4 MIL/uL (ref 3.87–5.11)
RDW: 12.7 % (ref 11.5–15.5)
WBC: 7.7 10*3/uL (ref 4.0–10.5)
nRBC: 0 % (ref 0.0–0.2)

## 2021-05-16 LAB — APTT: aPTT: 28 seconds (ref 24–36)

## 2021-05-16 LAB — TECHNOLOGIST SMEAR REVIEW: Plt Morphology: NORMAL

## 2021-05-16 LAB — PROTIME-INR
INR: 1.3 — ABNORMAL HIGH (ref 0.8–1.2)
Prothrombin Time: 16.5 seconds — ABNORMAL HIGH (ref 11.4–15.2)

## 2021-05-16 LAB — GLUCOSE, CAPILLARY: Glucose-Capillary: 94 mg/dL (ref 70–99)

## 2021-05-16 IMAGING — CR DG FEMUR 2+V*R*
4 series · 4 of 4 positions shown · non-contrast
Comparison: None Available.

CLINICAL DATA: Trauma, fall

EXAM:
RIGHT FEMUR 2 VIEWS

[femur ap (1 of 2)]
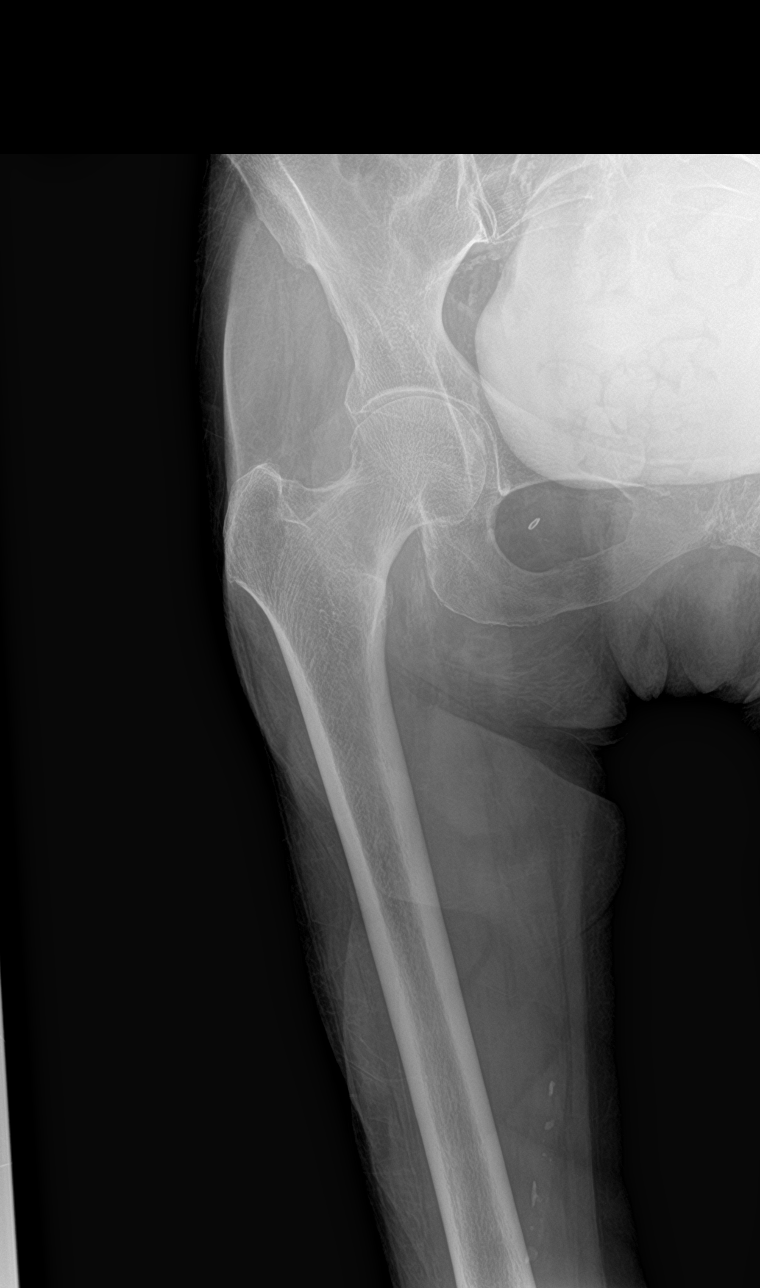

[femur ap (2 of 2)]
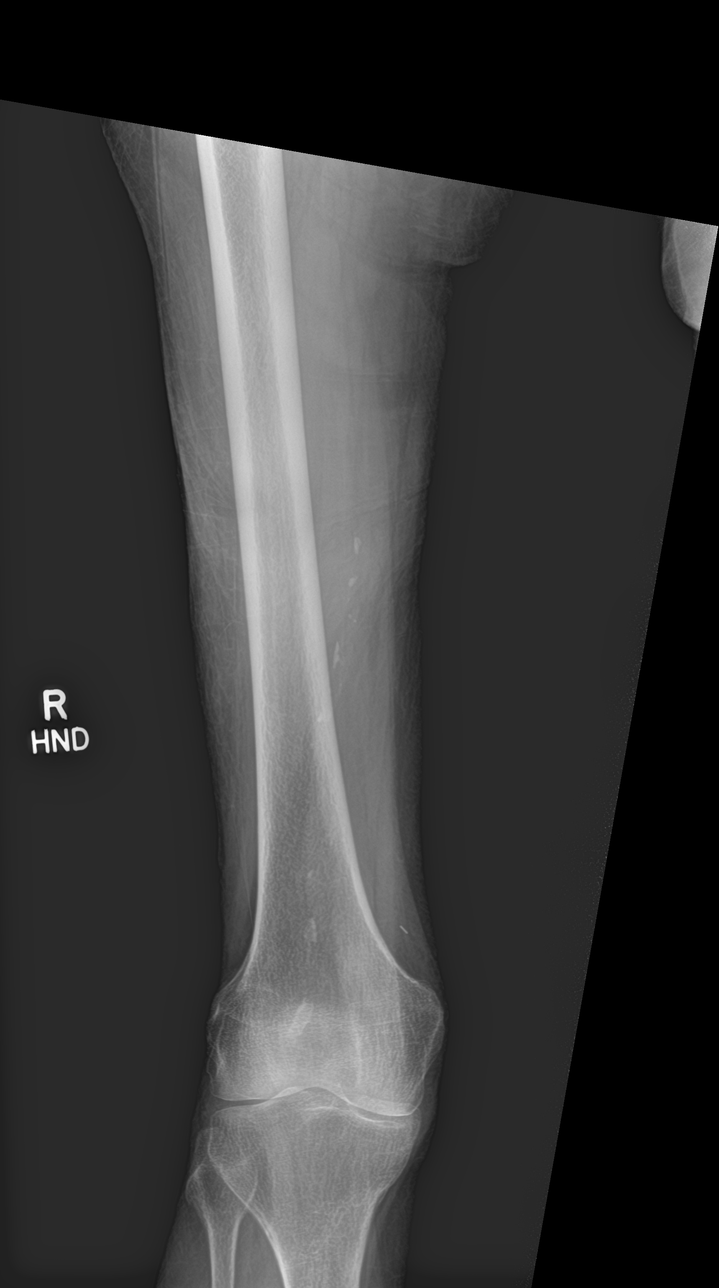

[femur lat (1 of 2)]
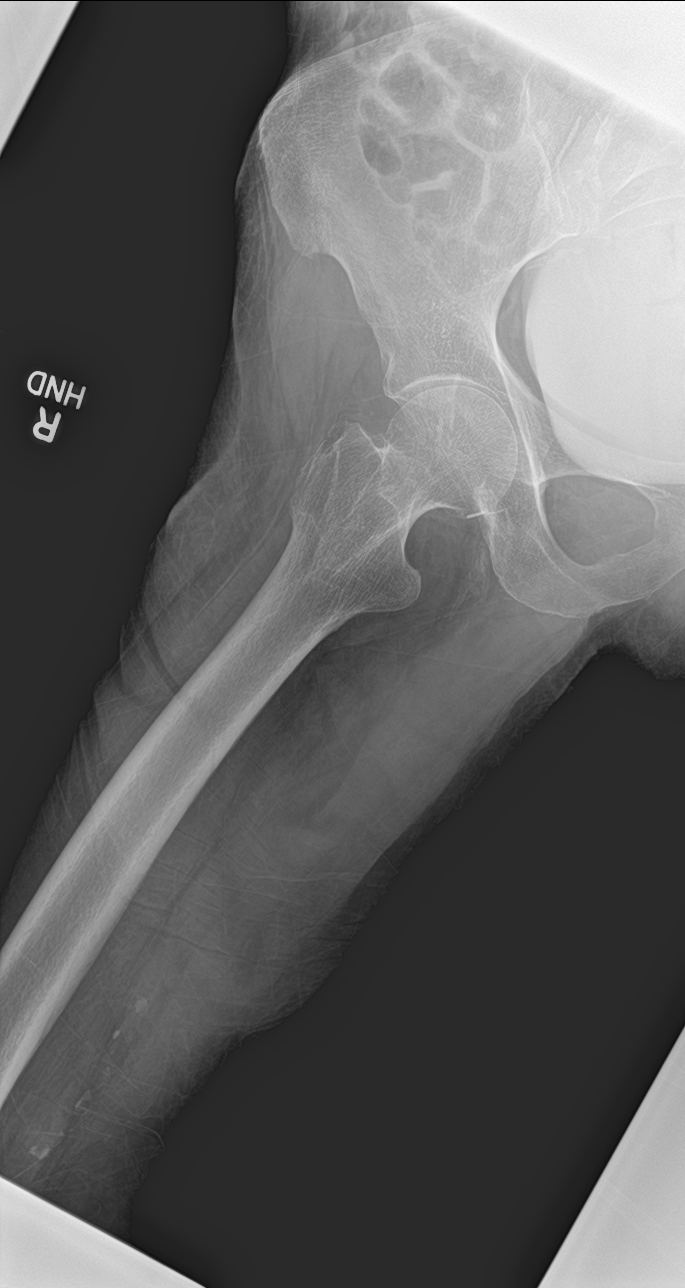

[femur lat (2 of 2)]
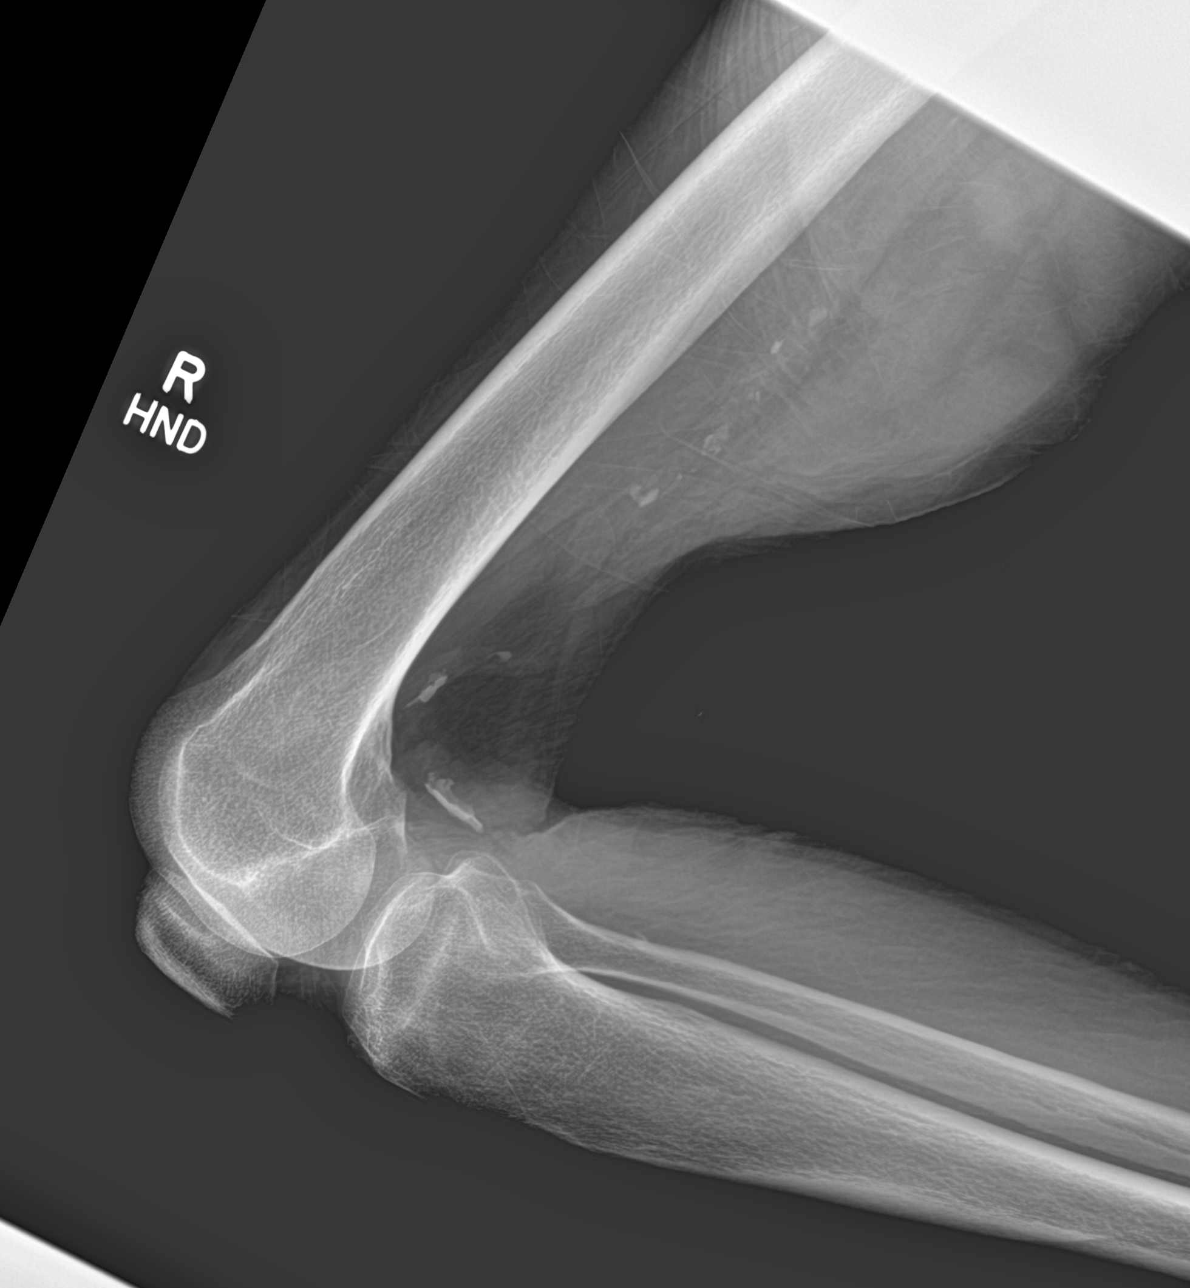

[4 of 4 positions shown; findings below may reference images not displayed]

FINDINGS: No displaced fracture or dislocation is seen. In the lateral view,
there is minimal cortical irregularity in the lateral aspect of
greater trochanter. There is contrast in the urinary bladder from
previous CT. Arterial calcifications are seen.
IMPRESSION: No displaced fracture or dislocation is seen. There is minimal
cortical irregularity in the lateral margin of greater trochanter of
proximal right femur. This may suggest recent or old undisplaced
fracture.

## 2021-05-16 IMAGING — CR DG ANKLE COMPLETE 3+V*L*
3 series · 3 of 3 positions shown · non-contrast
Comparison: None Available.

CLINICAL DATA: Fall several days ago

EXAM:
LEFT ANKLE COMPLETE - 3+ VIEW

[ankle ap]
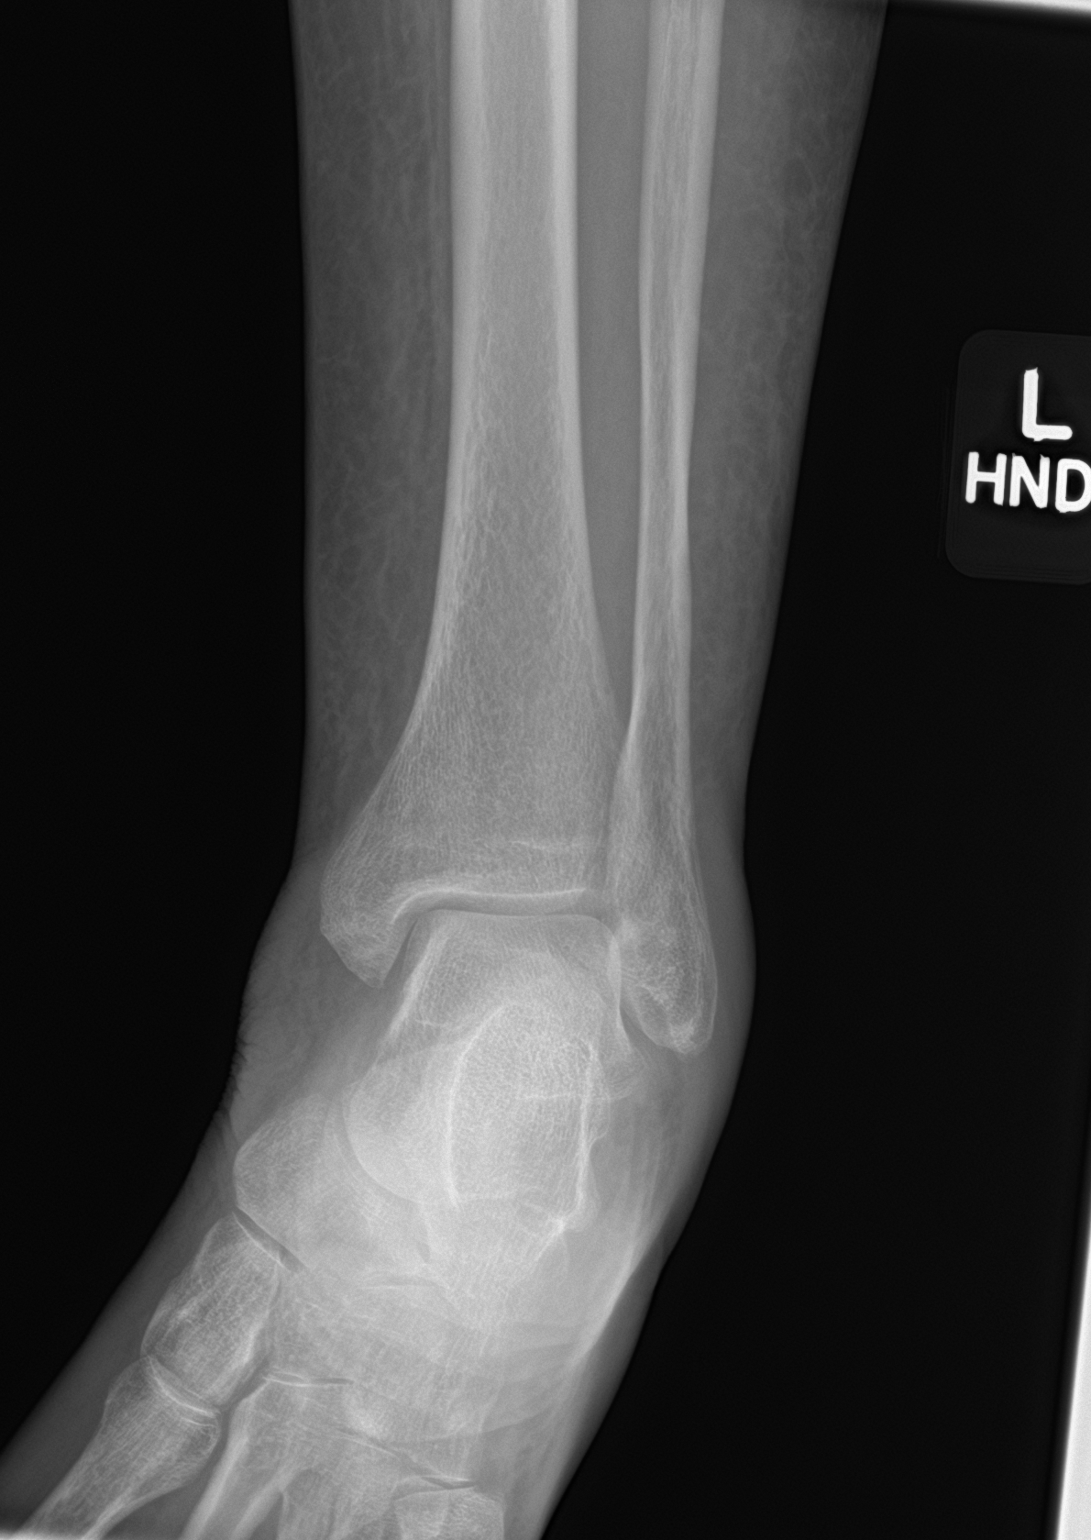

[ankle obl]
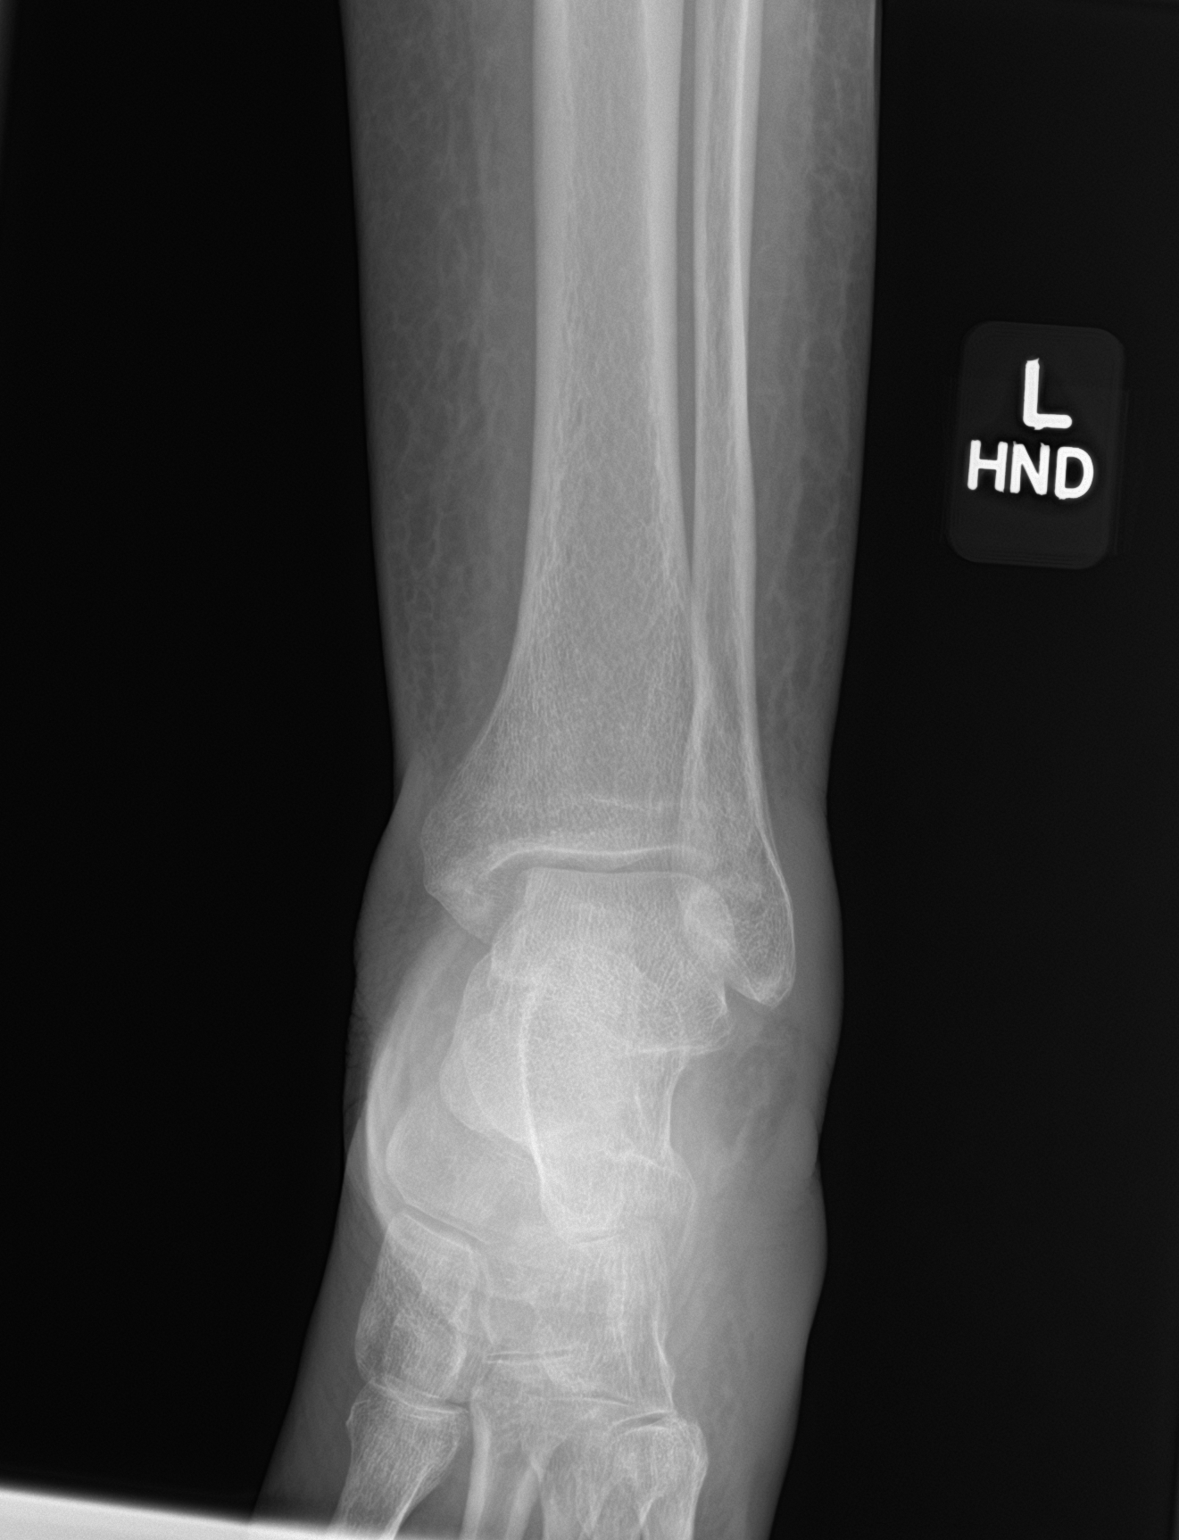

[ankle lat]
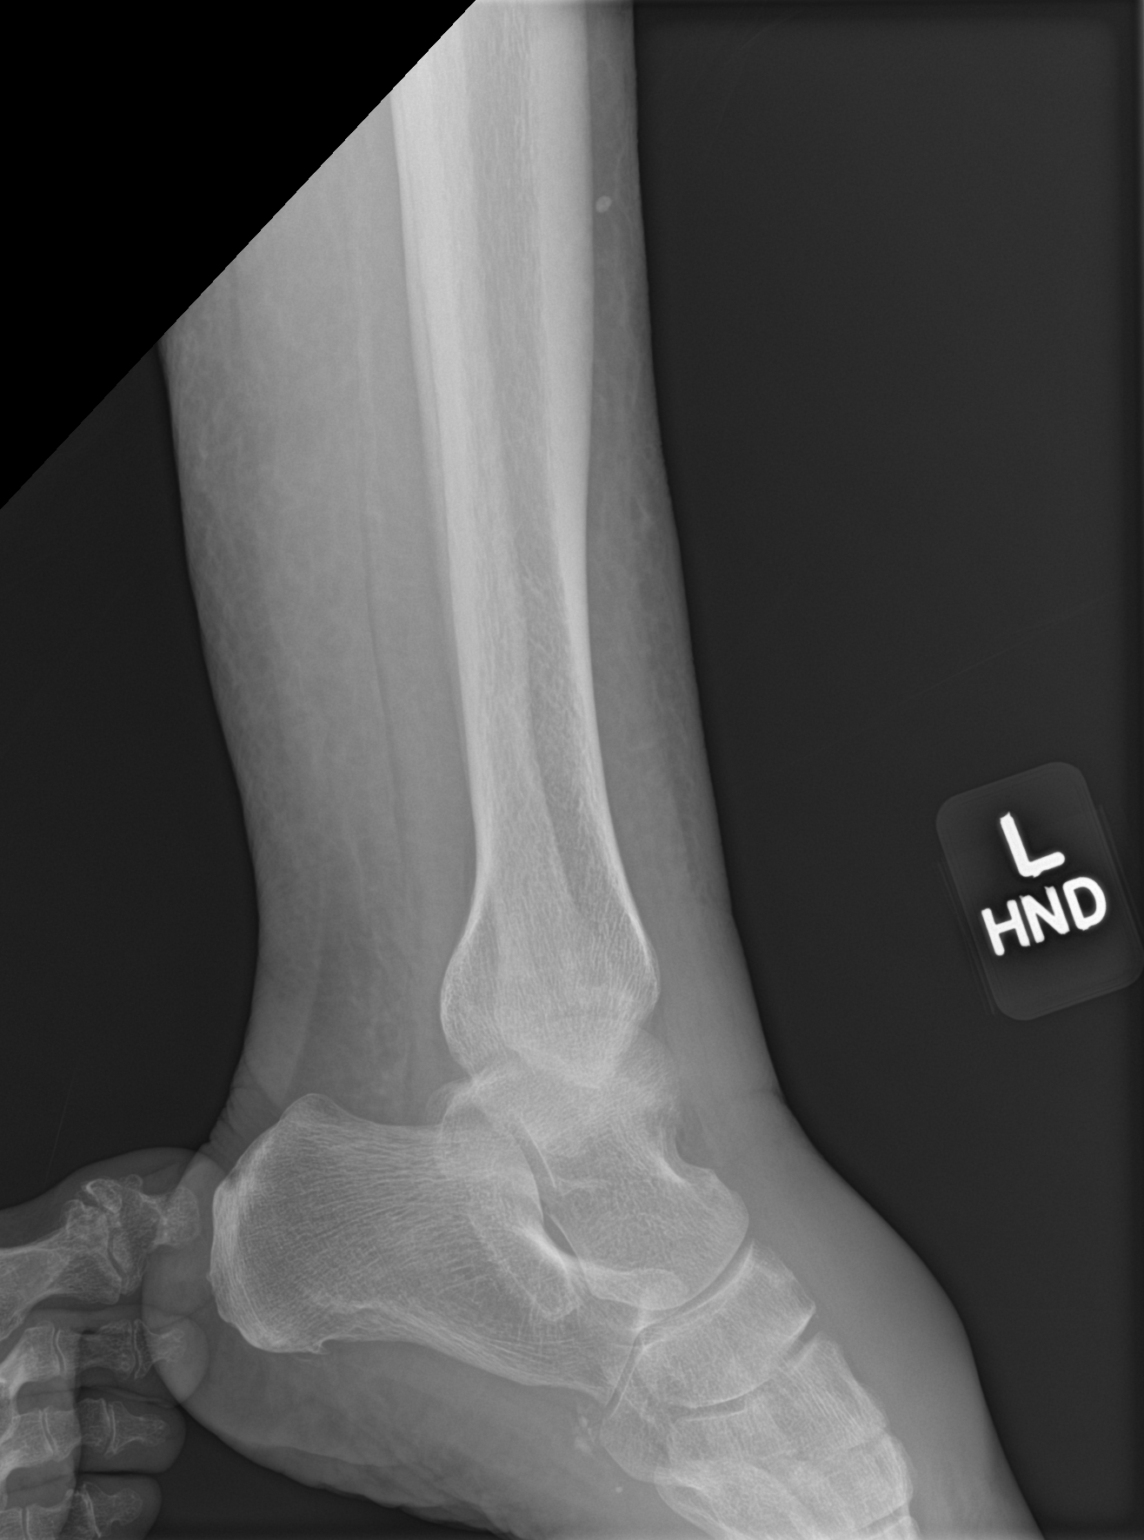

[3 of 3 positions shown; findings below may reference images not displayed]

FINDINGS: There is no evidence of fracture, dislocation, or joint effusion.
There is no evidence of arthropathy or other focal bone abnormality.
Soft tissues swelling about the ankle.
IMPRESSION: No acute osseous abnormality.

## 2021-05-16 IMAGING — CT CT HEAD CODE STROKE
3 of 4 series · 13 of 47 positions shown, 15 images · non-contrast
Comparison: CT head [DATE]

CLINICAL DATA: Code stroke.  Neuro deficit, acute, stroke suspected



[Series 2: head 5.0 st · axial · 0.45mm/px · z∈[-115,+5]mm · 7 of 32 slices shown, 9 images]
[im 4/32  brain]
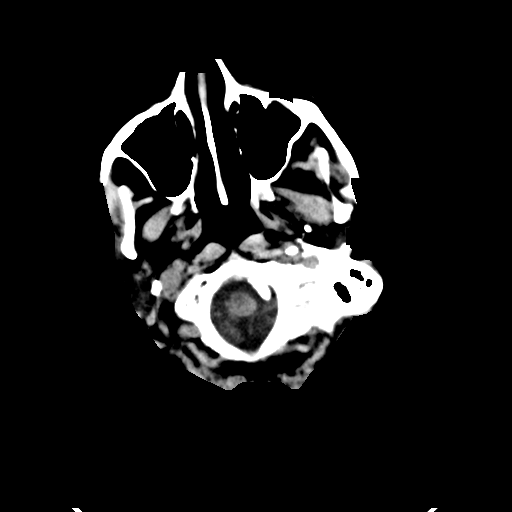
[im 4/32  bone]
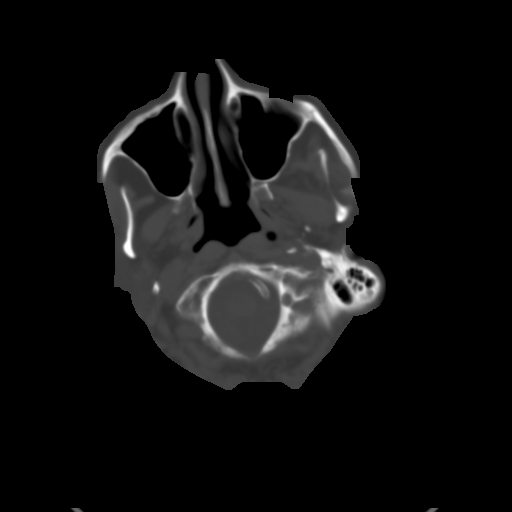
[im 8/32  brain]
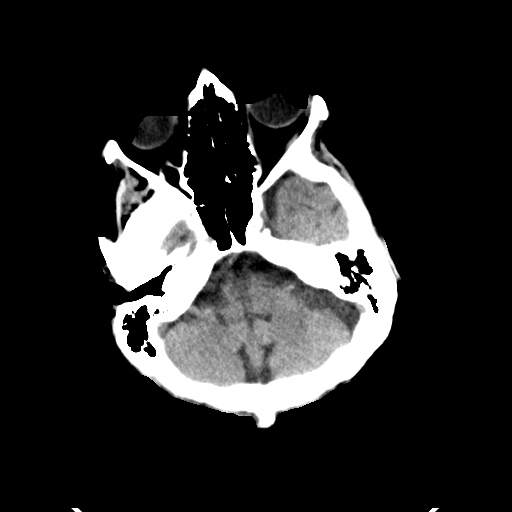
[im 12/32  brain]
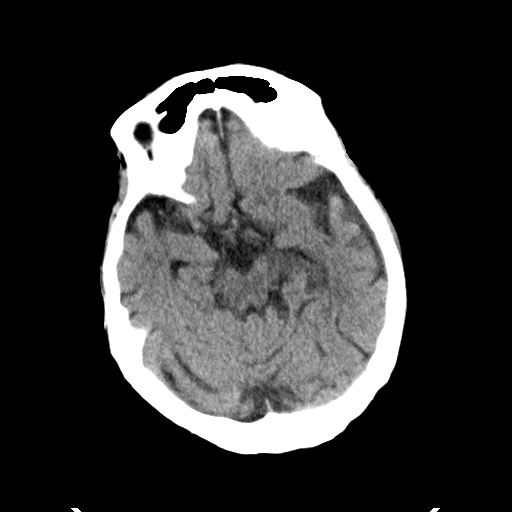
[im 16/32  brain]
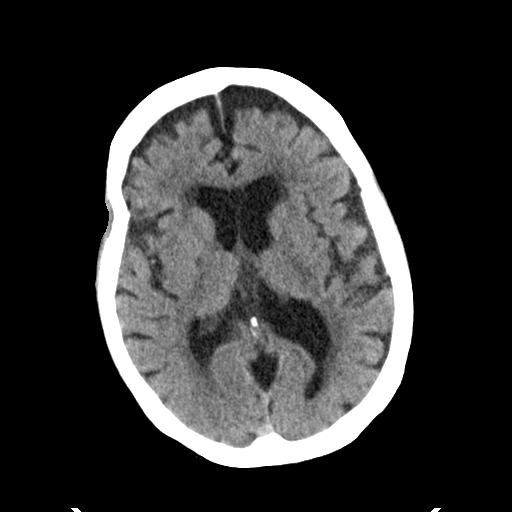
[im 20/32  brain]
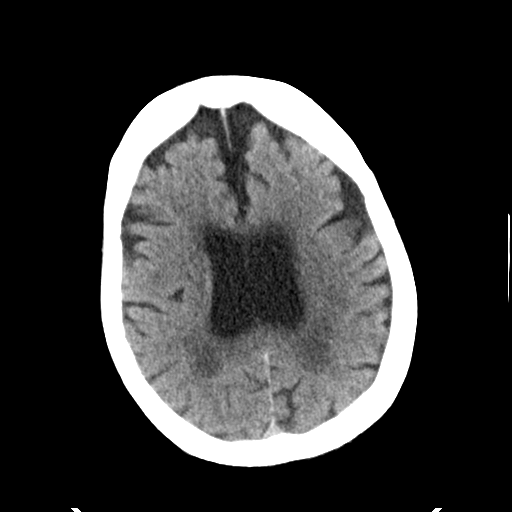
[im 20/32  bone]
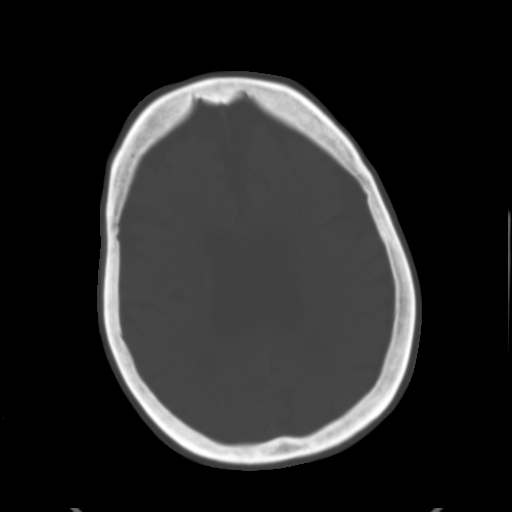
[im 24/32  brain]
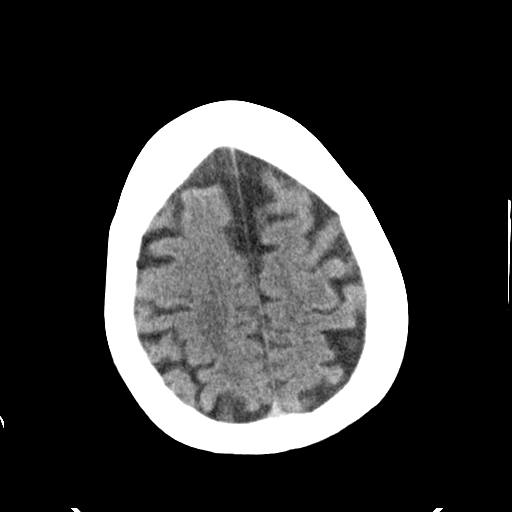
[im 28/32  brain]
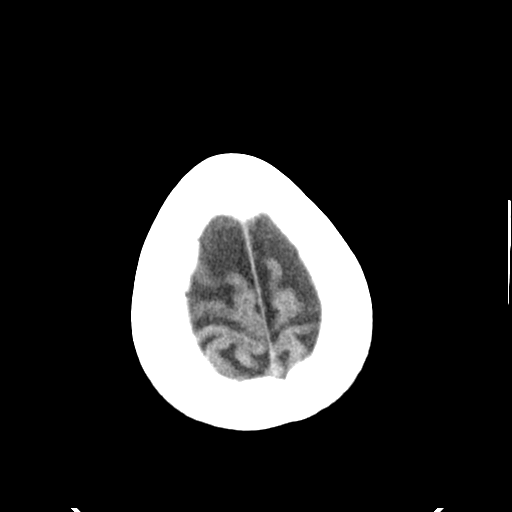

[Series 5: head 3.0 cor st · coronal · 0.31mm/px · 3 of 67 slices shown]
[im 23/67  brain]
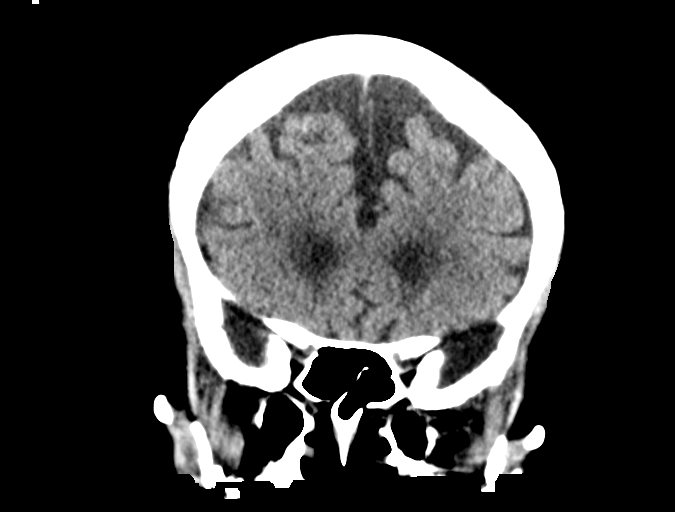
[im 30/67  brain]
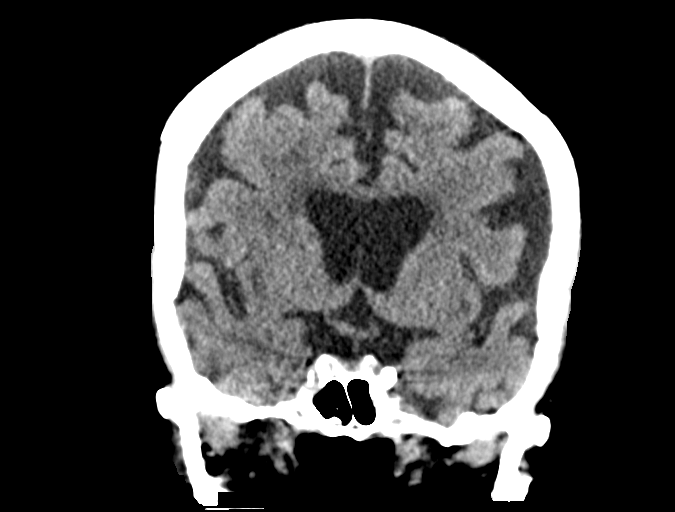
[im 37/67  brain]
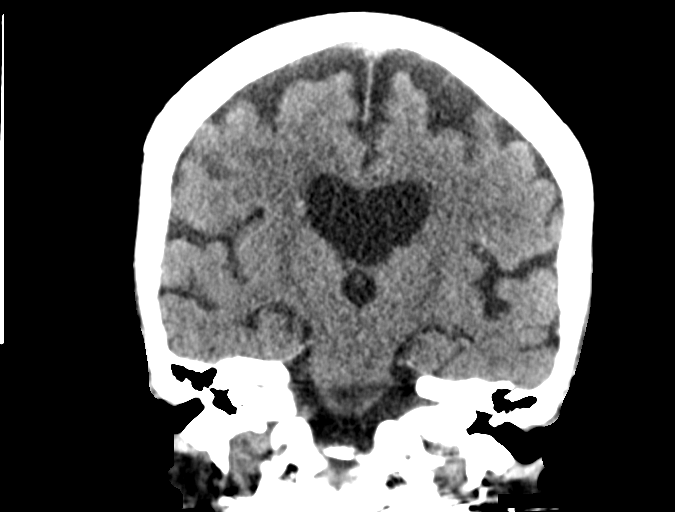

[Series 6: head 3.0 sag st · sagittal · 0.31mm/px · 3 of 66 slices shown]
[im 22/66  brain]
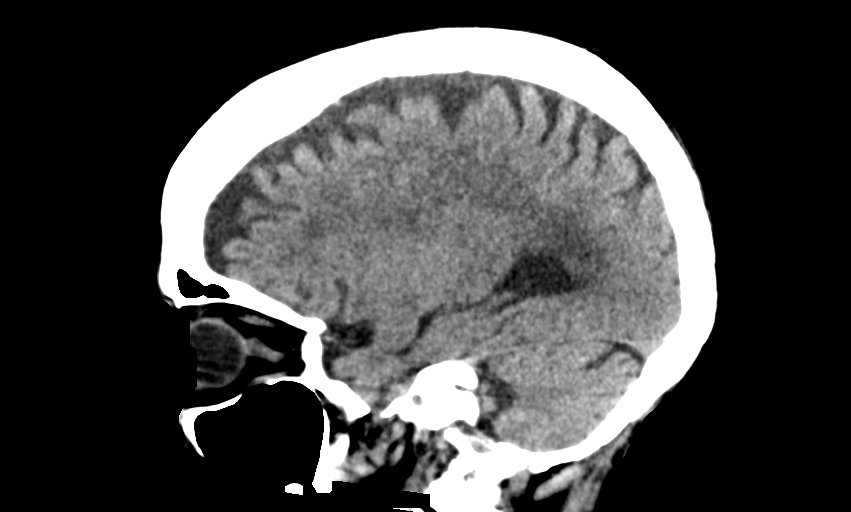
[im 33/66  brain]
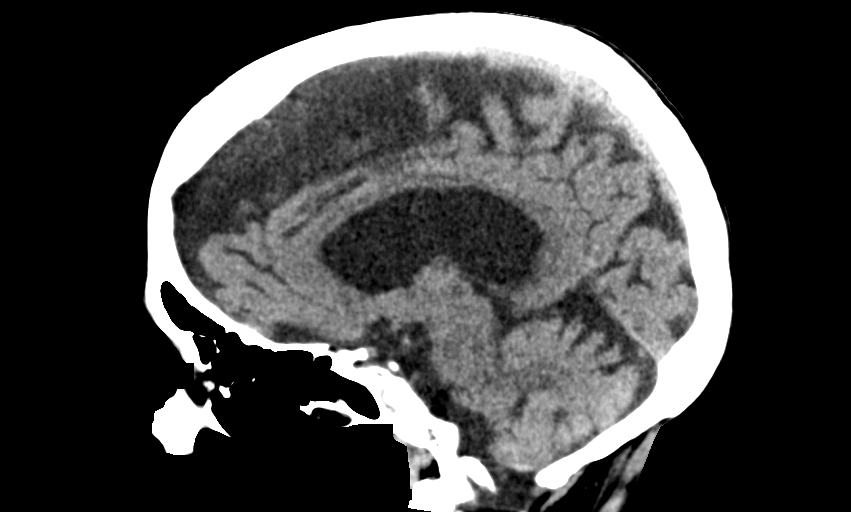
[im 44/66  brain]
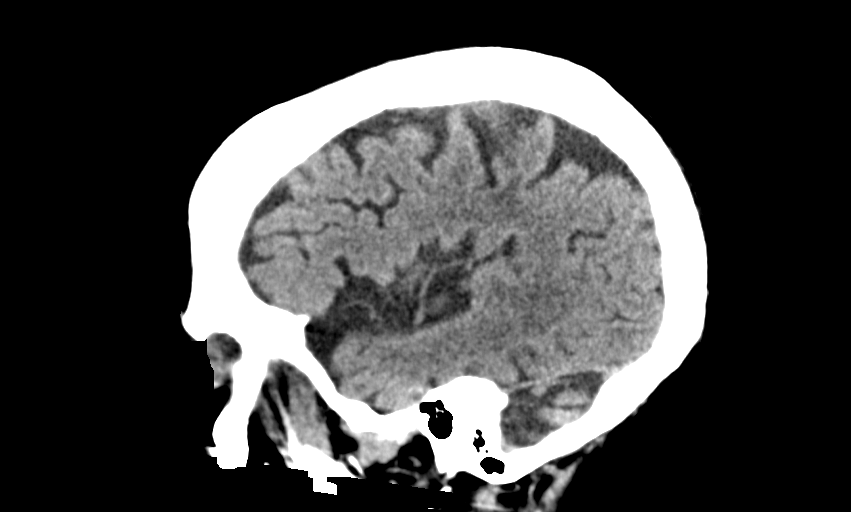

[13 of 47 positions shown; findings below may reference images not displayed]

FINDINGS: Brain: No evidence of acute infarction, hemorrhage, hydrocephalus,
extra-axial collection or mass lesion/mass effect. Similar chronic
microvascular ischemic disease and cerebral atrophy.

Vascular: No hyperdense vessel identified.

Skull: No acute fracture.

Sinuses/Orbits: Clear sinuses.  No acute orbital findings.

Other: No mastoid effusions.

ASPECTS (Alberta Stroke Program Early CT Score) total score (0-10
with 10 being normal): 10.
IMPRESSION: 1. No evidence of acute intracranial abnormality.  ASPECTS is 10.
2. Chronic microvascular ischemic disease and cerebral atrophy
([TU]-[TU]).

Code stroke imaging results were communicated on [DATE] at [DATE] to provider Stack via secure text paging.

## 2021-05-16 IMAGING — CR DG CHEST 1V
1 series · 1 of 1 positions shown · non-contrast
Comparison: [DATE]

CLINICAL DATA: Patient with stroke, slurred speech and expressive
aphasia

EXAM:
CHEST  1 VIEW

[chest ap]
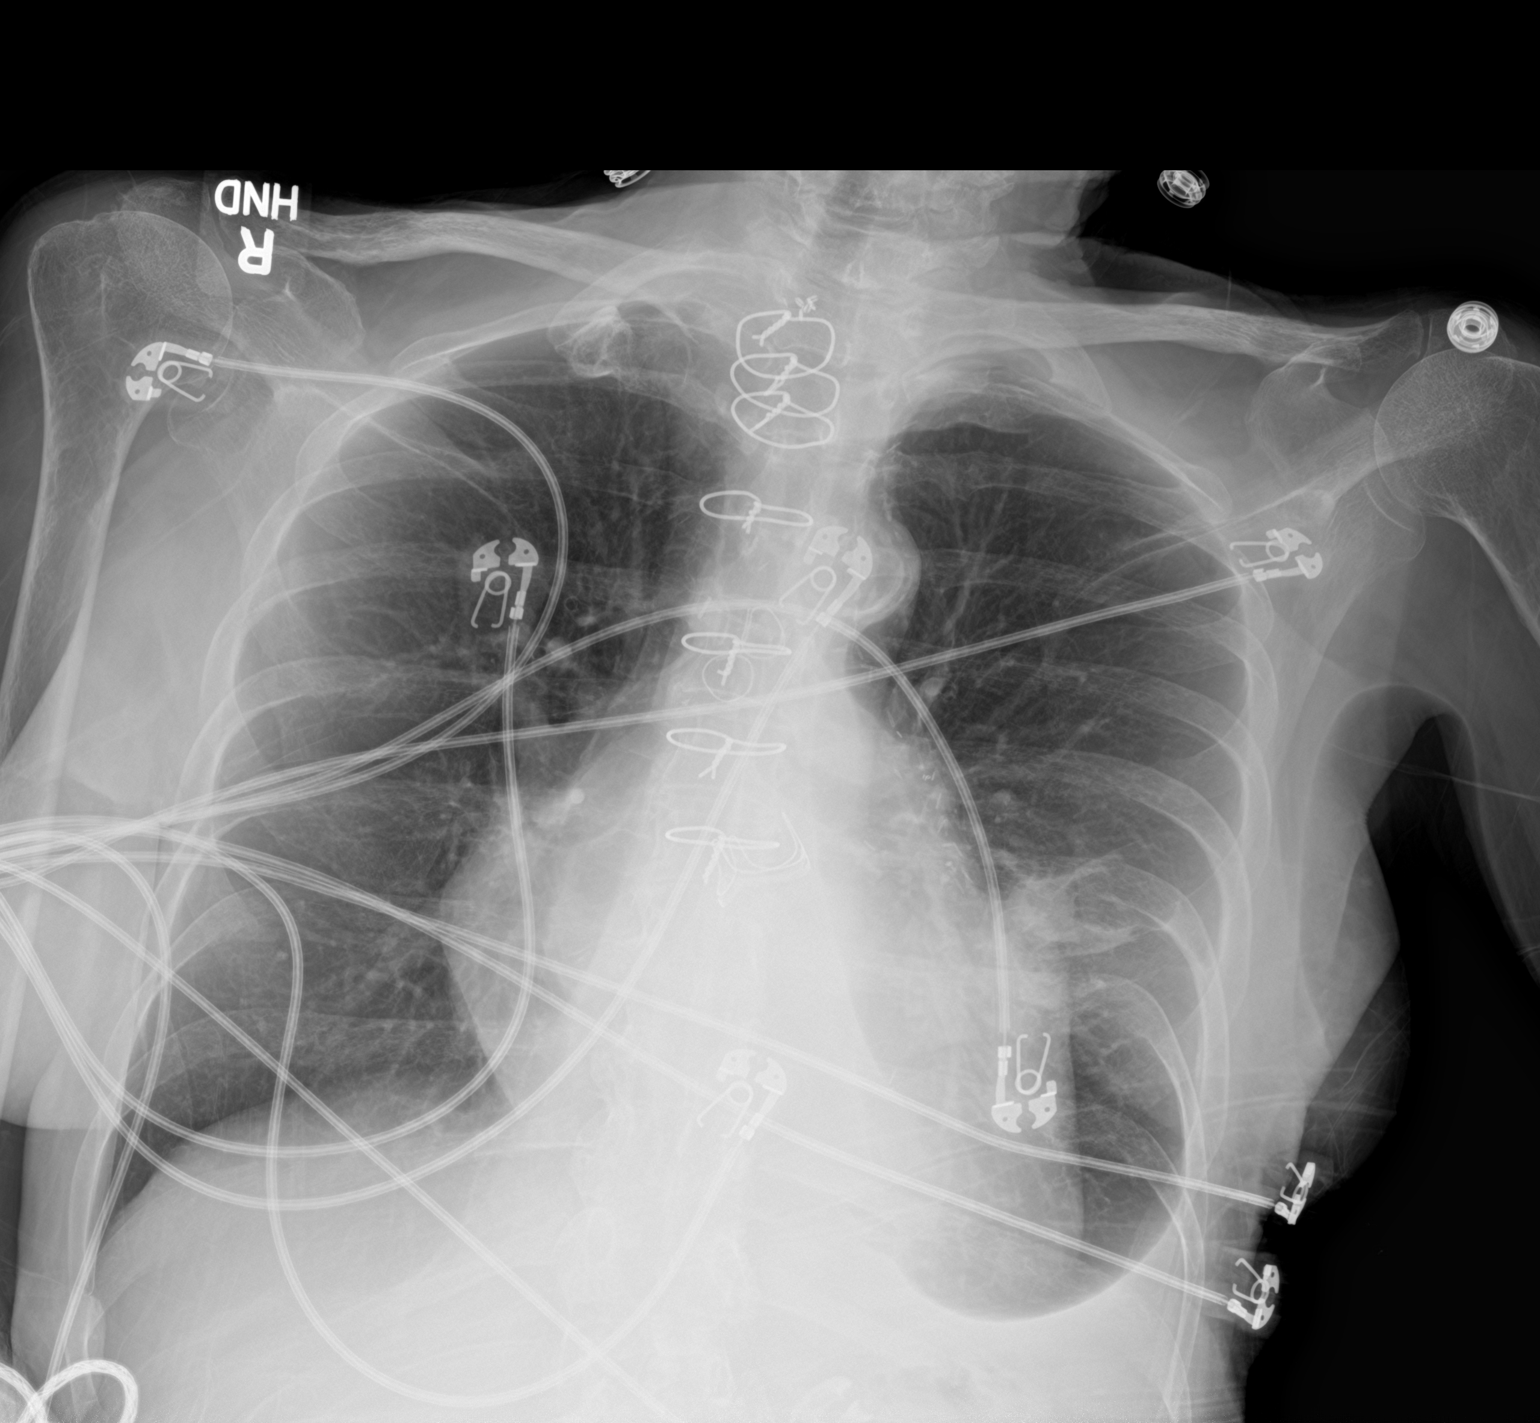

[1 of 1 positions shown; findings below may reference images not displayed]

FINDINGS: Sternotomy wires. Moderate cardiomegaly. Atherosclerotic
calcifications at the arch of the aorta. Mild to moderate left
pleural effusion and has decreased in the interim. No focal
consolidation. Osteopenia. Postsurgical changes at the visualized
left side of the neck.
IMPRESSION: Cardiomegaly and mild to moderate left pleural effusion and has
improved in the interim.

## 2021-05-16 IMAGING — CR DG FOOT COMPLETE 3+V*L*
3 series · 3 of 3 positions shown · non-contrast
Comparison: None

CLINICAL DATA: Fall

EXAM:
LEFT FOOT - COMPLETE 3 VIEW

[foot ap]
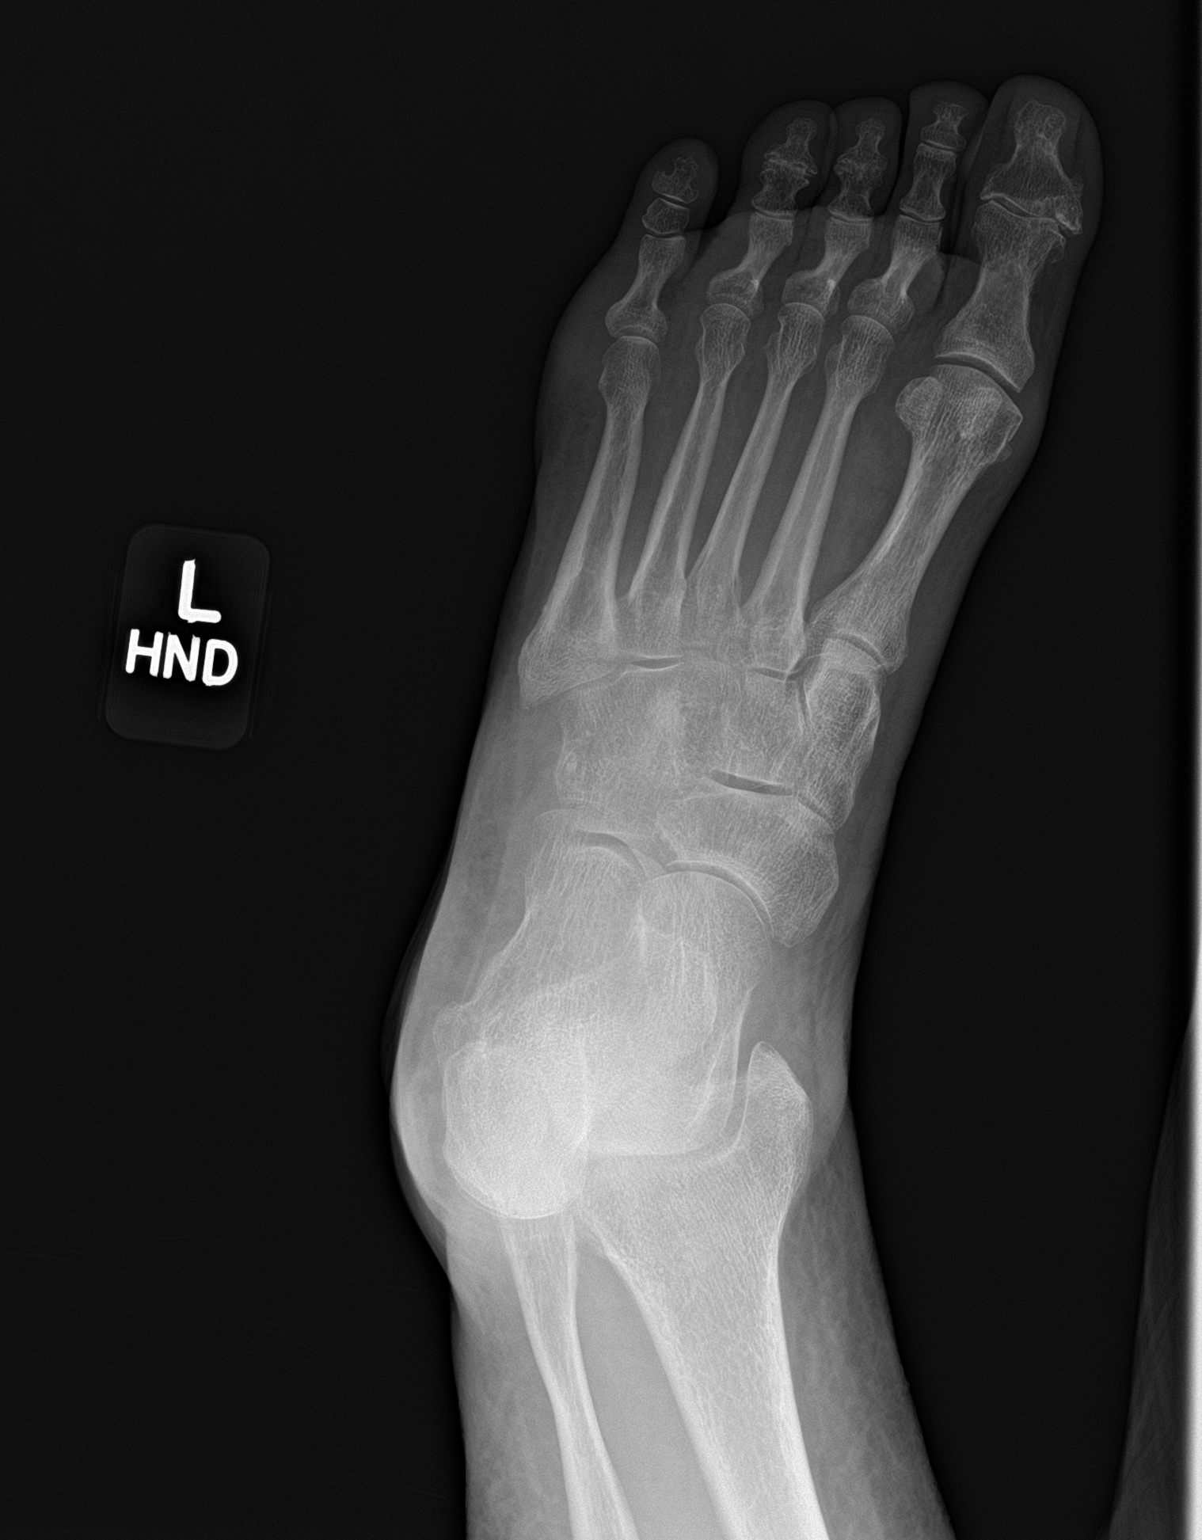

[foot obl]
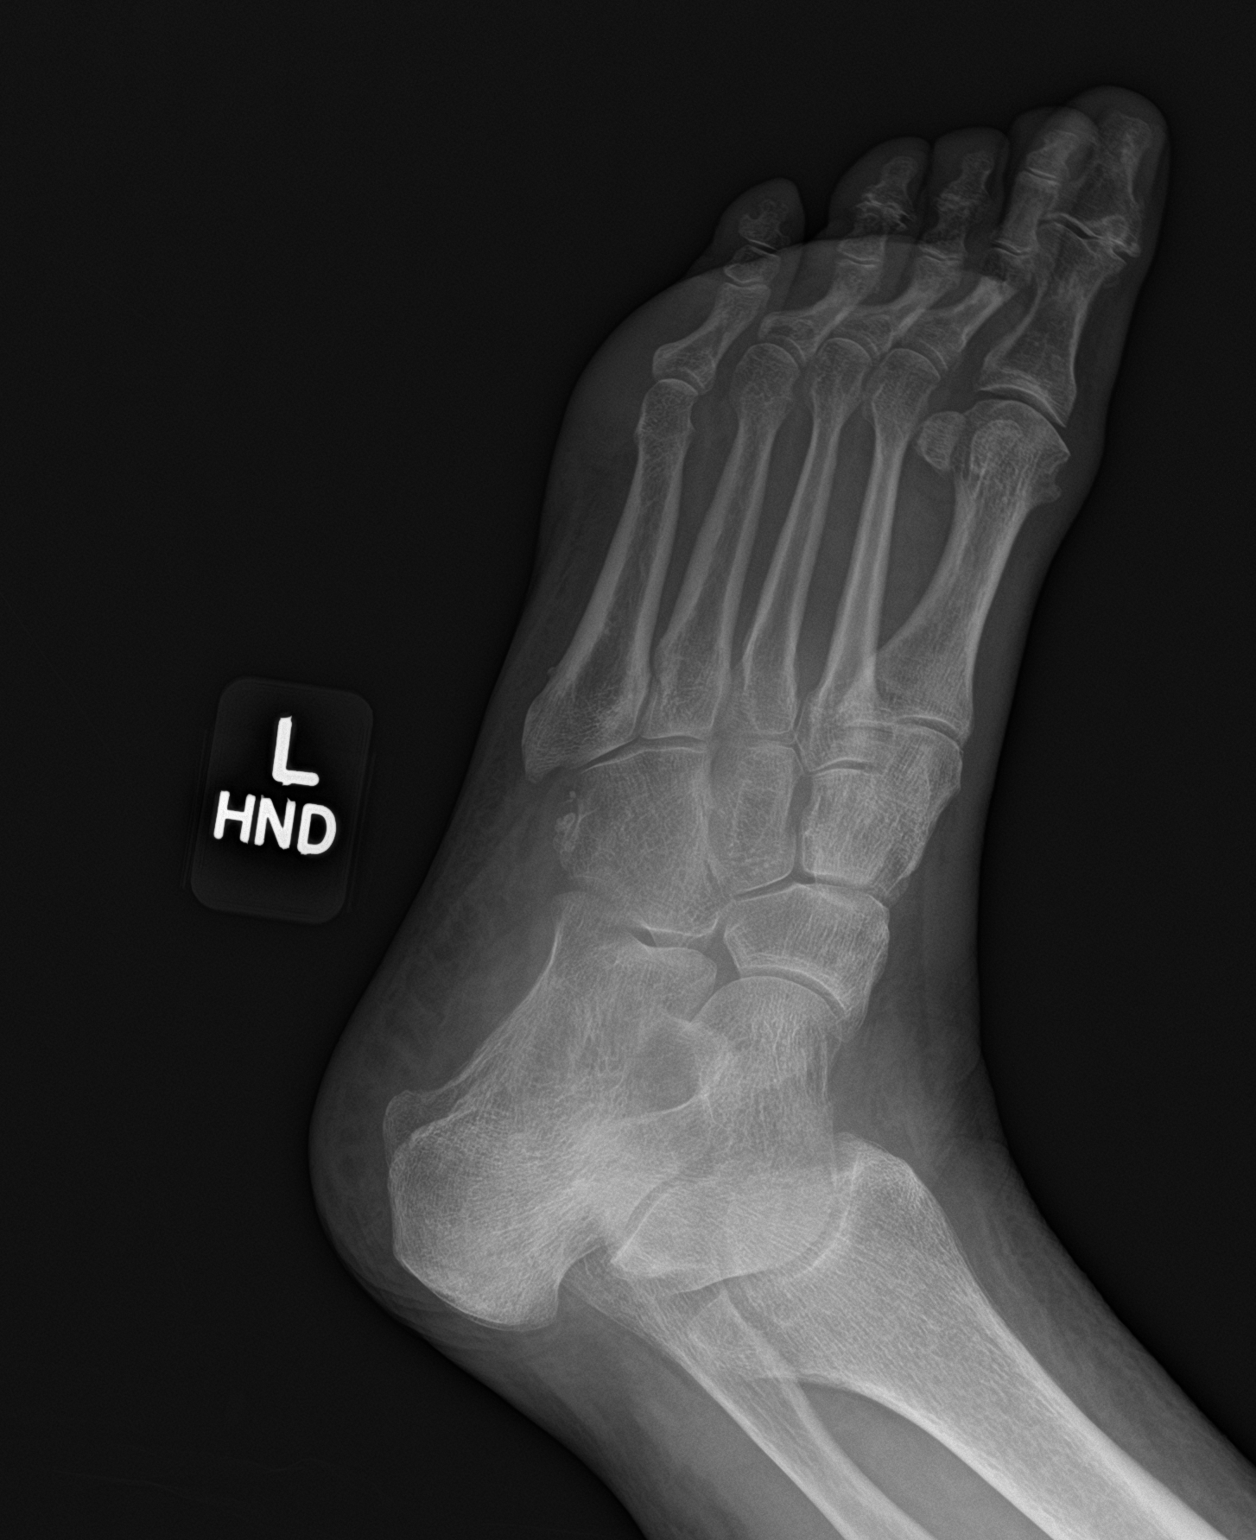

[foot lat]
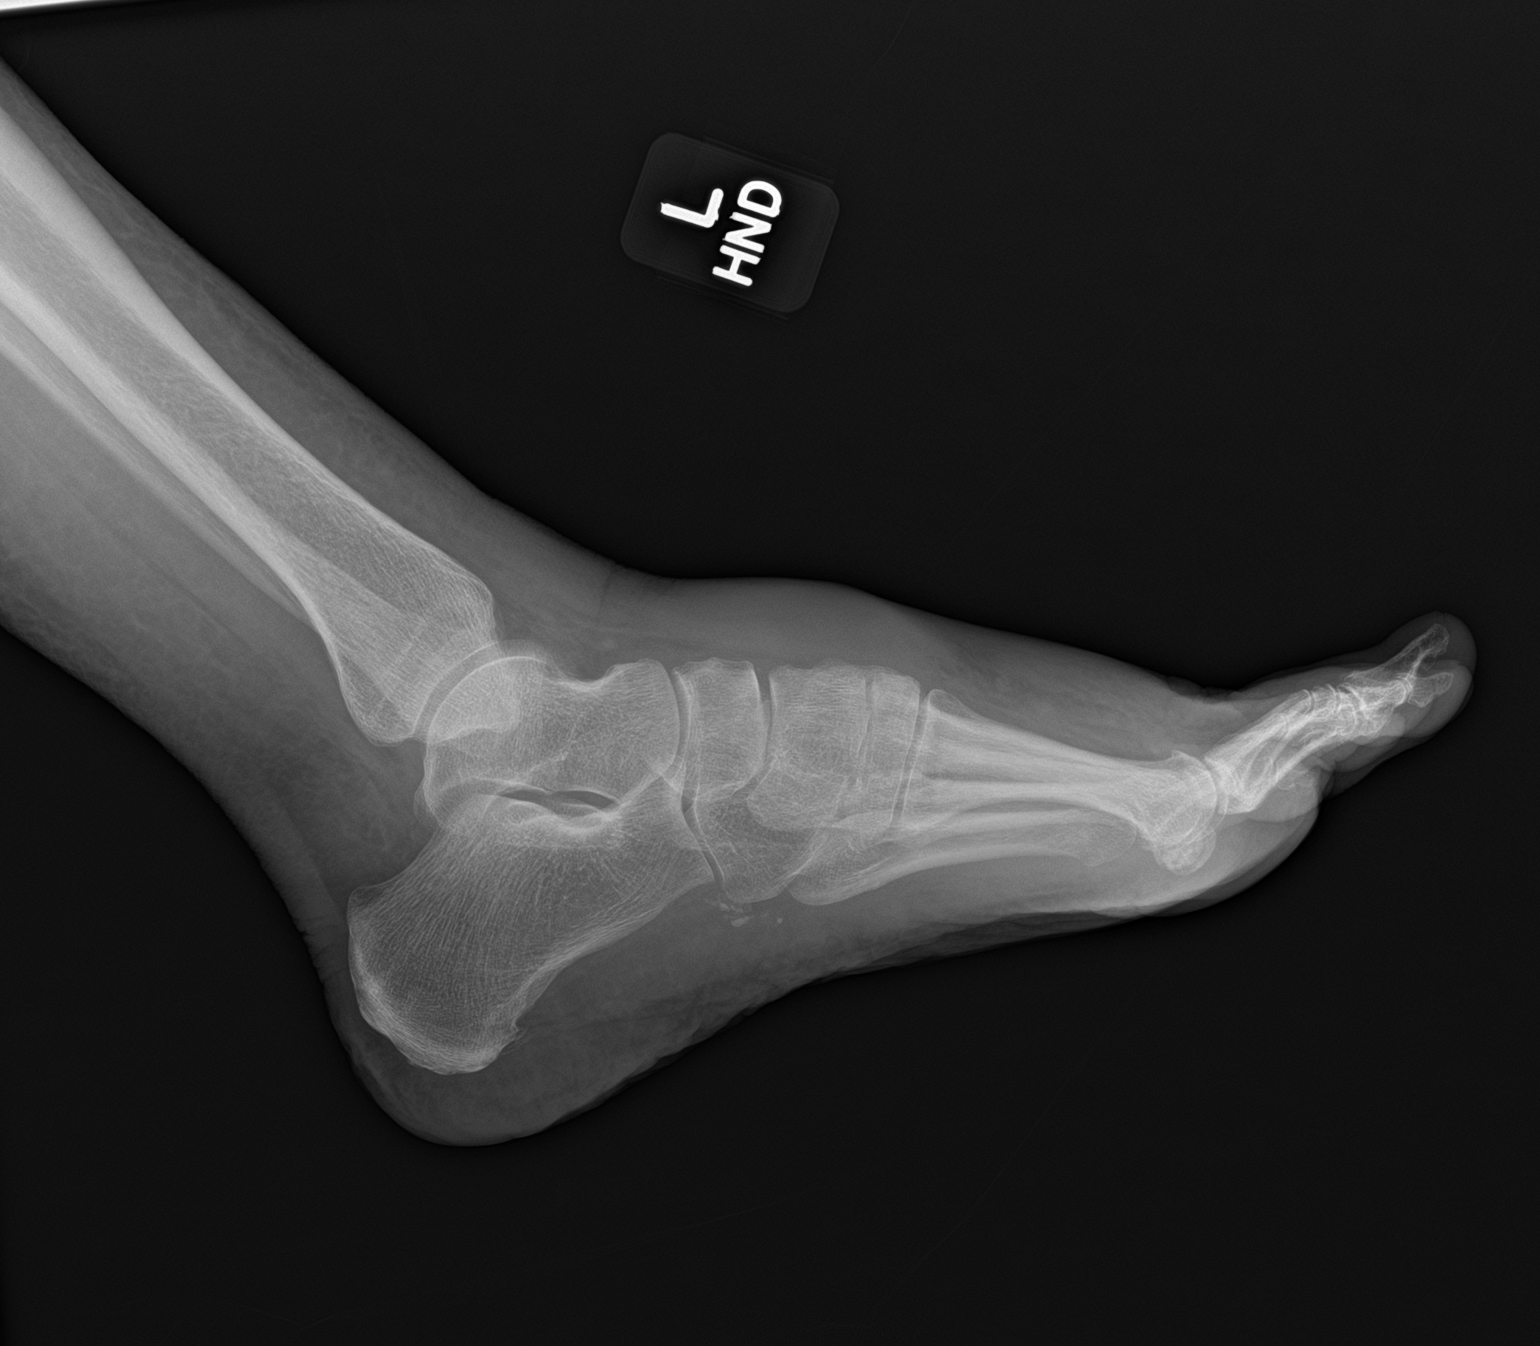

[3 of 3 positions shown; findings below may reference images not displayed]

FINDINGS: There is no evidence of fracture or dislocation. There moderate
degenerative changes of the great toe and third and fourth distal IP
joints. Soft tissue swelling of the dorsal midfoot.
IMPRESSION: No acute abnormality.

## 2021-05-16 MED ORDER — LABETALOL HCL 5 MG/ML IV SOLN: 10.0000 mg | INTRAVENOUS | Status: DC | PRN

## 2021-05-16 MED ORDER — IOHEXOL 350 MG/ML SOLN
100.0000 mL | Freq: Once | INTRAVENOUS | Status: AC | PRN
  Administered 2021-05-16: 100 mL via INTRAVENOUS

## 2021-05-16 MED ORDER — CALCIUM CARB-CHOLECALCIFEROL 600-10 MG-MCG PO CAPS: 600.0000 mg | ORAL_CAPSULE | Freq: Every day | ORAL | Status: DC

## 2021-05-16 MED ORDER — DILTIAZEM HCL-DEXTROSE 125-5 MG/125ML-% IV SOLN (PREMIX)
5.0000 mg/h | INTRAVENOUS | Status: DC
  Administered 2021-05-16: 15 mg/h via INTRAVENOUS
  Administered 2021-05-16: 5 mg/h via INTRAVENOUS
  Administered 2021-05-17: 15 mg/h via INTRAVENOUS
  Filled 2021-05-16 (×3): qty 125

## 2021-05-16 MED ORDER — CHOLECALCIFEROL 10 MCG (400 UNIT) PO TABS
400.0000 [IU] | ORAL_TABLET | Freq: Every day | ORAL | Status: DC
  Filled 2021-05-16: qty 1

## 2021-05-16 MED ORDER — CALCIUM CARBONATE ANTACID 500 MG PO CHEW: 1.0000 | CHEWABLE_TABLET | Freq: Every day | ORAL | Status: DC

## 2021-05-16 MED ORDER — ACETAMINOPHEN 325 MG PO TABS: 650.0000 mg | ORAL_TABLET | Freq: Four times a day (QID) | ORAL | Status: DC | PRN

## 2021-05-16 MED ORDER — DILTIAZEM HCL 25 MG/5ML IV SOLN
20.0000 mg | Freq: Once | INTRAVENOUS | Status: AC
  Administered 2021-05-16: 20 mg via INTRAVENOUS
  Filled 2021-05-16: qty 5

## 2021-05-16 MED ORDER — ACETAMINOPHEN 650 MG RE SUPP: 650.0000 mg | RECTAL | Status: DC | PRN

## 2021-05-16 MED ORDER — ADULT MULTIVITAMIN W/MINERALS CH: 1.0000 | ORAL_TABLET | Freq: Every day | ORAL | Status: DC

## 2021-05-16 MED ORDER — LACTATED RINGERS IV BOLUS
500.0000 mL | Freq: Once | INTRAVENOUS | Status: AC
  Administered 2021-05-16: 500 mL via INTRAVENOUS

## 2021-05-16 MED ORDER — ENOXAPARIN SODIUM 30 MG/0.3ML IJ SOSY
30.0000 mg | PREFILLED_SYRINGE | INTRAMUSCULAR | Status: DC
  Administered 2021-05-16: 30 mg via SUBCUTANEOUS
  Filled 2021-05-16: qty 0.3

## 2021-05-16 MED ORDER — HM MULTIVITAMIN ADULT GUMMY PO CHEW
1.0000 | CHEWABLE_TABLET | Freq: Every day | ORAL | Status: DC
Start: 2021-05-16 — End: 2021-05-16

## 2021-05-16 MED ORDER — ATORVASTATIN CALCIUM 10 MG PO TABS: 10.0000 mg | ORAL_TABLET | Freq: Every day | ORAL | Status: DC

## 2021-05-16 MED ORDER — SODIUM CHLORIDE 0.9% FLUSH
3.0000 mL | Freq: Once | INTRAVENOUS | Status: AC
  Administered 2021-05-16: 3 mL via INTRAVENOUS

## 2021-05-16 NOTE — ED Notes (Signed)
Patient transported to X-ray 

## 2021-05-16 NOTE — ED Provider Notes (Signed)
?[place] ?Provider Note ? ? ?CSN: [id number] ?Arrival date & time: 05/16/21  5974 ? ?An emergency department physician performed an initial assessment on this suspected stroke patient at [phone]. ? ?History ? ?Chief Complaint  ?Patient presents with  ? Code Stroke  ? ? ?Nicole Mercado is a 81 y.o. female. ? ?HPI ? ?81 year old female presents emergency department as a code stroke after being found stumbling with slurred speech/aphasia.  Patient was reportedly checked on by a neighbor last night around 7 PM.  Was mentioned to be "baseline".  Patient was found this morning stumbling out of her bathroom.  Level 5 caveat due to acuity and aphasia.  Patient is able to give minimal history secondary to speech changes.  Patient evaluated at EMS bridge with stroke neurology team at bedside.  She has obvious bruising and swelling to the left foot/ankle, presumably from fall a couple days ago. ? ?Home Medications ?Prior to Admission medications   ?Medication Sig Start Date End Date Taking? Authorizing Provider  ?acetaminophen (TYLENOL) 325 MG tablet Take 650 mg by mouth every 6 (six) hours as needed for mild pain or headache.     [provider]  ?apixaban (ELIQUIS) 5 MG TABS tablet Take 1 tablet (5 mg total) by mouth 2 (two) times daily. 04/24/21   Leanor Kail, PA  ?aspirin 81 MG tablet Take 81 mg by mouth daily.    [provider]  ?atorvastatin (LIPITOR) 10 MG tablet TAKE 1 TABLET BY MOUTH ONCE DAILY AT  6  IN  THE  EVENING 12/28/20   Sherren Mocha, MD  ?Calcium Carb-Cholecalciferol 600-10 MG-MCG CAPS Take 600 mg by mouth daily.    [provider]  ?diltiazem (CARDIZEM CD) 180 MG 24 hr capsule Take 1 capsule (180 mg total) by mouth daily. 04/24/21   Leanor Kail, PA  ?metoprolol tartrate (LOPRESSOR) 100 MG tablet Take 1 tablet (100 mg total) by mouth 2 (two) times daily. 03/25/21   Jettie Booze, MD  ?Multiple Vitamins-Minerals (HM  MULTIVITAMIN ADULT GUMMY PO) Take 1 tablet by mouth daily. Vita fusion    [provider]  ?oxybutynin (DITROPAN-XL) 10 MG 24 hr tablet Take 10 mg by mouth daily. 03/08/21   [provider]  ?   ? ?Allergies    ?Patient has no known allergies.   ? ?Review of Systems   ?Review of Systems  ?Unable to perform ROS: Patient nonverbal  ? ?Physical Exam ?Updated Vital Signs ?BP (!) 193/142   Pulse 100   Temp (!) 97.5 ?F (36.4 ?C) (Oral)   Resp 16   Ht 5' (1.524 m)   Wt 41.7 kg   SpO2 95%   BMI 17.95 kg/m?  ?Physical Exam ?Vitals and nursing note reviewed.  ?Constitutional:   ?   General: She is not in acute distress. ?   Appearance: Normal appearance.  ?HENT:  ?   Head: Normocephalic.  ?   Mouth/Throat:  ?   Mouth: Mucous membranes are moist.  ?Eyes:  ?   Pupils: Pupils are equal, round, and reactive to light.  ?Cardiovascular:  ?   Rate and Rhythm: Normal rate.  ?Pulmonary:  ?   Effort: Pulmonary effort is normal. No respiratory distress.  ?Abdominal:  ?   Palpations: Abdomen is soft.  ?   Tenderness: There is no abdominal tenderness.  ?Musculoskeletal:  ?   Cervical back: No rigidity or tenderness.  ?   Comments: Left ankle and foot is ecchymotic,  edematous with the foot slightly inverted inward, knee/hip and pelvis appear stable.  No other extremity bruising/injury  ?Skin: ?   General: Skin is warm.  ?Neurological:  ?   Mental Status: She is alert. She is disoriented.  ?   Comments: aphasic  ?Psychiatric:     ?   Mood and Affect: Mood normal.  ? ? ?ED Results / Procedures / Treatments   ?Labs ?(all labs ordered are listed, but only abnormal results are displayed) ?Labs Reviewed  ?PROTIME-INR - Abnormal; Notable for the following components:  ?    Result Value  ? Prothrombin Time 16.5 (*)   ? INR 1.3 (*)   ? All other components within normal limits  ?CBC - Abnormal; Notable for the following components:  ? Platelets 128 (*)   ? All other components within normal limits  ?COMPREHENSIVE METABOLIC  PANEL - Abnormal; Notable for the following components:  ? Glucose, Bld 163 (*)   ? Creatinine, Ser 1.25 (*)   ? Total Protein 6.2 (*)   ? Total Bilirubin 2.0 (*)   ? GFR, Estimated 44 (*)   ? All other components within normal limits  ?I-STAT CHEM 8, ED - Abnormal; Notable for the following components:  ? Creatinine, Ser 1.10 (*)   ? Glucose, Bld 137 (*)   ? Calcium, Ion 1.01 (*)   ? All other components within normal limits  ?CBG MONITORING, ED - Abnormal; Notable for the following components:  ? Glucose-Capillary 153 (*)   ? All other components within normal limits  ?APTT  ?DIFFERENTIAL  ? ? ?EKG ?None ? ?Radiology ?CT HEAD CODE STROKE WO CONTRAST ? ?Result Date: 05/16/2021 ?CLINICAL DATA:  Code stroke.  Neuro deficit, acute, stroke suspected EXAM: CT HEAD WITHOUT CONTRAST TECHNIQUE: Contiguous axial images were obtained from the base of the skull through the vertex without intravenous contrast. RADIATION DOSE REDUCTION: This exam was performed according to the departmental dose-optimization program which includes automated exposure control, adjustment of the mA and/or kV according to patient size and/or use of iterative reconstruction technique. COMPARISON:  CT head March 30, 2021 FINDINGS: Brain: No evidence of acute infarction, hemorrhage, hydrocephalus, extra-axial collection or mass lesion/mass effect. Similar chronic microvascular ischemic disease and cerebral atrophy. Vascular: No hyperdense vessel identified. Skull: No acute fracture. Sinuses/Orbits: Clear sinuses.  No acute orbital findings. Other: No mastoid effusions. ASPECTS [facility] Stroke Program Early CT Score) total score (0-10 with 10 being normal): 10. IMPRESSION: 1. No evidence of acute intracranial abnormality.  ASPECTS is 10. 2. Chronic microvascular ischemic disease and cerebral atrophy (ICD10-G31.9). Code stroke imaging results were communicated on 05/16/2021 at 8:38 am to provider [facility] via secure text paging. Electronically Signed   By:  Margaretha Sheffield M.D.   On: 05/16/2021 08:39  ? ?CT ANGIO HEAD NECK W WO CM W PERF (CODE STROKE) ? ?Result Date: 05/16/2021 ?CLINICAL DATA:  Neuro deficit, acute, stroke suspected L facial droop, severe aphasia EXAM: CT ANGIOGRAPHY HEAD AND NECK CT PERFUSION BRAIN TECHNIQUE: Multidetector CT imaging of the head and neck was performed using the standard protocol during bolus administration of intravenous contrast. Multiplanar CT image reconstructions and MIPs were obtained to evaluate the vascular anatomy. Carotid stenosis measurements (when applicable) are obtained utilizing NASCET criteria, using the distal internal carotid diameter as the denominator. Multiphase CT imaging of the brain was performed following IV bolus contrast injection. Subsequent parametric perfusion maps were calculated using RAPID software. RADIATION DOSE REDUCTION: This exam was performed according to the departmental  dose-optimization program which includes automated exposure control, adjustment of the mA and/or kV according to patient size and/or use of iterative reconstruction technique. CONTRAST:  168m OMNIPAQUE IOHEXOL 350 MG/ML SOLN COMPARISON:  Same day head CT. FINDINGS: CTA NECK FINDINGS Aortic arch: Great vessel origins are largely not imaged. Right carotid system: Mixed atherosclerosis at the carotid bifurcation without greater than 50% stenosis relative to the distal vessel. Left carotid system: Partially imaged origin with approximately 40% stenosis. Atherosclerosis at the carotid bifurcation without greater than 50% stenosis. Vertebral arteries: Left dominant. Moderate (approximately 50%) stenosis of the left V2 vertebral artery. Skeleton: Multilevel degenerative change. Other neck: No acute findings. Upper chest: Partially imaged layering left pleural effusion. Biapical pleuroparenchymal scarring. Otherwise, visualized lung apices are clear. Review of the MIP images confirms the above findings CTA HEAD FINDINGS Anterior  circulation: Calcific atherosclerosis of bilateral ICAs intracranially with mild stenosis. Absent or hypoplastic left A1 ACA, probably congenital given prominent right A1 ACA. Otherwise, bilateral MCAs and ACAs are patent wi

## 2021-05-16 NOTE — Consult Note (Deleted)
Correction to attestation: NIHSS = 8.  Bing Neighbors, MD [facility] [phone]  If 7pm- 7am, please page neurology on call as listed in AMION.   NEUROLOGY CONSULTATION NOTE   Date of service: May 16, 2021 Patient Name: Nicole Mercado MRN:  [id number] DOB:  08/03/1940 Reason for consult: "Code stroke" Requesting physician: EDP _ _ _   _ __   _ __ _ _  __ __   _ __   __ _  History of Present Illness   Nicole Mercado is a 81 y.o. female with PMH significant for  has a past medical history of Aortic stenosis, Arthritis, Atrial fibrillation (HCC), CAD (coronary artery disease), Carotid artery occlusion, CLL (chronic lymphocytic leukemia) (HCC), H/O exercise stress test, cardiovascular stress test, echocardiogram, Hypertension, PAD (peripheral artery disease) (HCC), Pancreatitis, and Thrombocytopenia (HCC) (11/19/2010). who presents with aphasia left-sided facial droop.  Patient was in her usual state of health until a few days ago when she experienced a fall injuring her left lower extremity. I am unsure what precipitated the fall or if she had further evaluation and management of her lower extremity injury.  She does however have a history of recurrent sympathy and orthostatic hypotension as well as atrial fibrillation. Patient apparently lives alone but does have a neighbor who checks in on her periodically.  Patient was last known well yesterday evening at 7 PM.  This morning a neighbor found her weak and confused and called EMS.  Code stroke was initiated .  In the ED the patient was found to be severely aphasic which complicated much of her neuro exam.  She did have some left-sided facial droop and was able to lift her bilateral upper extremities with significant coaxing and assistance. Patient has a history of atrial fibrillation on Eliquis.  I am unsure when she last took this medication.  She was previously seen here on 05/08/2021 for cardioversion sessile and without  complications.  However, she presented today with atrial flutter with rapid ventricular response.  Stroke workup this admission:  CTH: Performed without any obvious ICH  MRI brain: Pending  CTA/MRA: Pending  Lipid Panel:  Lab Results  Component Value Date   LDLCALC 33 03/30/2021   HgbA1c:  Lab Results  Component Value Date   HGBA1C 5.3 03/30/2021    TTE pending   ROS   Per HPI; all other systems reviewed and are negative  Past History   Past Medical History:  Diagnosis Date   Aortic stenosis    a. Echocardiogram (01/11/14):  Mild LVH, EF 60-65%, Gr 1 DD, possible bicuspid AV, severe AS (mean 61 mmHg, peak 104 mmHg), mild MVP of post leaflet, mild MR, PASP 33 mmHg.;  b. 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 LHC (01/13/14):  dLM 70 extending into oLAD, mLAD 40-50, pD1 80, pD2 70, ostial/prox OM1 70 >> CABG (L-LAD, S-OM1, S-D1, S-D2)  // Myoview 10/22: Fixed defect anterolateral wall consistent with artifact, EF 71, no ischemia, low risk   Carotid artery occlusion    a. s/p L CEA 2013;  b.  Carotid US (1/16):  Bilateral ICA 1-39% // Carotid US 10/22: Bilateral ICA 1-39   CLL (chronic lymphocytic leukemia) (HCC)    slow leukemia---Dr  Murinson   H/O exercise stress test    NSSTT   Hx of cardiovascular stress test  a. Nuclear stress test That (4/13): Normal perfusion, EF 74%   Hx of echocardiogram    Echo (2/16):  Mild LVH, EF 60-65%, no RWMA, Gr 2 DD, AVR ok (mean 9 mmHg), trivial AI, mild MR, mild LAE, PASP 40 mmHg   Hypertension    PAD (peripheral artery disease) (HCC)    LE Arterial US 10/22: R - pCFA, oSFA, dSFA 30-49; L - pCFA + mSFA 50-74, pPop 30-49 >> referred to [facility]   Pancreatitis    h/o   Thrombocytopenia (HCC) 11/19/2010   Past Surgical History:  Procedure Laterality Date   ABDOMINAL HYSTERECTOMY     AORTIC VALVE REPLACEMENT [name] 01/17/2014    Procedure: AORTIC VALVE REPLACEMENT (AVR);  Surgeon: Alleen Borne, MD;  Location: [facility] OR;  Service: Open Heart Surgery;  Laterality: [name];   APPENDECTOMY     CARDIOVERSION [name] 04/04/2021   Procedure: CARDIOVERSION;  Surgeon: Thomasene Ripple, DO;  Location: [facility] ENDOSCOPY;  Service: Cardiovascular;  Laterality: [name];   CARDIOVERSION [name] 05/08/2021   Procedure: CARDIOVERSION;  Surgeon: Thomasene Ripple, DO;  Location: [facility] ENDOSCOPY;  Service: Cardiovascular;  Laterality: [name];   CAROTID ENDARTERECTOMY  06/09/11   LEFT  cea   CESAREAN SECTION     FIVE   CHOLECYSTECTOMY     CORONARY ARTERY BYPASS GRAFT [name] 01/17/2014   Procedure: CORONARY ARTERY BYPASS GRAFTING (CABG)TIMES FOUR USING LEFT INTERNAL MAMMARY ARTERY AND RIGHT SAPHENOUS VEIN HARVESTED ENDOSCOPICALLY;  Surgeon: Alleen Borne, MD;  Location: [facility] OR;  Service: Open Heart Surgery;  Laterality: [name];   ENDARTERECTOMY  06/09/2011   Procedure: ENDARTERECTOMY CAROTID;  Surgeon: Larina Earthly, MD;  Location: [facility] OR;  Service: Vascular;  Laterality: Left;  left carotid endarterectomy with patch angioplasty   LEFT AND RIGHT HEART CATHETERIZATION WITH CORONARY ANGIOGRAM [name] 01/13/2014   Procedure: LEFT AND RIGHT HEART CATHETERIZATION WITH CORONARY ANGIOGRAM;  Surgeon: Corky Crafts, MD;  Location[facility] C[facility] LAB;  Service: Cardiovascular;  Laterality: [name];   TEE WITHOUT CARDIOVERSION [name] 01/17/2014   Procedure: TRANSESOPHAGEAL ECHOCARDIOGRAM (TEE);  Surgeon: Alleen Borne, MD;  Location: [facility] OR;  Service: Open Heart Surgery;  Laterality: [name];   TEE WITHOUT CARDIOVERSION [name] 04/04/2021   Procedure: TRANSESOPHAGEAL ECHOCARDIOGRAM (TEE);  Surgeon: Thomasene Ripple, DO;  Location: [facility] ENDOSCOPY;  Service: Cardiovascular;  Laterality: [name];   TONSILLECTOMY     Family History  Problem Relation Age of Onset   Heart disease Mother        in her 31s   Hypertension Mother    Heart attack Mother    Heart disease Father    Hypertension Father    Hypertension Sister     Hypertension Son    Stroke Paternal Grandmother    Stroke Paternal Grandfather    Social History   Socioeconomic History   Marital status: Widowed    Spouse name: Not on file   Number of children: Not on file   Years of education: Not on file   Highest education level: Not on file  Occupational History   Not on file  Tobacco Use   Smoking status: Never   Smokeless tobacco: Never  Vaping Use   Vaping Use: Never used  Substance and Sexual Activity   Alcohol use: No   Drug use: No   Sexual activity: Not on file  Other Topics Concern   Not on file  Social History Narrative   Not on file   Social Determinants of Health   Financial Resource Strain: Not on file  Food Insecurity: Not on file  Transportation Needs: Not on file  Physical Activity: Not on file  Stress: Not on file  Social Connections: Not on file   No Known Allergies  Medications   (Not in a hospital admission)    Vitals   Vitals:   05/16/21 0845 05/16/21 0850 05/16/21 0900 05/16/21 0904  BP: (!) 170/86  (!) 189/118 (!) 193/142  Pulse: (!) 147  (!) 119 100  Resp:   18 16  Temp:      TempSrc:      SpO2: 99% 100% 99% 95%  Weight:   41.7 kg   Height:   5' (1.524 m)      Body mass index is 17.95 kg/m.  Physical Exam   Physical Exam Gen: A&O x4, NAD Resp: CTAB CV: RRR Extrem: Nml bulk; no cyanosis, clubbing. Severe bruising and swelling of L foot.  Neuro: *MS: able to follow some simple commands, unable to answer orientation questions 2/2 aphasia *Speech: no intelligible speech *CN: PERRLA, blinks to threat bilat, optic discs not visualized 2/2 pupillary construction, EOMI w/o nystagmus, no ptosis, L UMN facial droop, hearing intact to voice *Motor: 5/5 strength in all extremities except anti-gravity with drift LLE *Sensory: SILT *Coordination:  UTA *Reflexes:  2+ and symmetric throughout without clonus; toes down-going bilat *Gait: deferred  NIHSS  1a Level of Conscious.: 0 1b LOC  Questions: 2 1c LOC Commands: 0 2 Best Gaze: 0 3 Visual: 0 4 Facial Palsy: 1 5a Motor Arm - left: 0 5b Motor Arm - Right: 0 6a Motor Leg - Left: 1 6b Motor Leg - Right: 0 7 Limb Ataxia: 0 8 Sensory: 0 9 Best Language: 2 10 Dysarthria: 2 11 Extinct. and Inatten.: 0  TOTAL: 8   Labs    CBC:  Recent Labs  Lab 05/16/21 0819 05/16/21 0824  WBC 7.7  --   NEUTROABS 4.3  --   HGB 14.3 13.6  HCT 42.1 40.0  MCV 95.7  --   PLT 128*  --     Basic Metabolic Panel:  Lab Results  Component Value Date   NA 136 05/16/2021   K 3.7 05/16/2021   CO2 22 05/16/2021   GLUCOSE 137 (H) 05/16/2021   BUN 16 05/16/2021   CREATININE 1.10 (H) 05/16/2021   CALCIUM 10.1 05/16/2021   GFRNONAA 44 (L) 05/16/2021   GFRAA 40 (L) 06/17/2019    Urine Drug Screen:     Component Value Date/Time   LABOPIA NONE DETECTED 03/30/2021 2044   COCAINSCRNUR NONE DETECTED 03/30/2021 2044   LABBENZ NONE DETECTED 03/30/2021 2044   AMPHETMU NONE DETECTED 03/30/2021 2044   THCU NONE DETECTED 03/30/2021 2044   LABBARB NONE DETECTED 03/30/2021 2044    Alcohol Level     Component Value Date/Time   ETH <10 03/30/2021 2045    CT Head without contrast: No evidence of intracranial head and Rudich annual hemorrhage chronic microvascular ischemia and cerebral atrophy noted.  CT angio Head and Neck with contrast: No large vessel occlusion but moderate left P2 and intradural vertebral artery stenosis as well as left P2 PCA stenosis.  Approximately 40% of the left common carotid artery stenosis  MRI Brain: pending    Impression   Cerebral infarct Based on patient's presenting symptoms, it is likely the patient has experienced a cerebral infarct.  NIHSS score of at least 6 but limited due to severe aphasia.  Patient is not a candidate for TNK as she is on Eliquis and  last known normal was 12 hours ago.  No LVO seen on CTA. Will have patient admitted for further evaluation management of her  stroke.  Recommendations   Cerebral infarct - Admit to hospitalist service for stroke w/u; stroke team will consult - Permissive HTN x48 hrs from sx onset or until stroke ruled out by MRI goal BP <220/110. PRN labetalol or hydralazine if BP above these parameters. Avoid oral antihypertensives. - MRI brain wo contrast - TTE  - Check A1c and LDL + add statin per guidelines - Hold home anticoag - q4 hr neuro checks - STAT head CT for any change in neuro exam - Tele - PT/OT/SLP - Stroke education - Amb referral to neurology upon discharge  Stroke team will continue to follow.   ______________________________________________________________________   Thank you for the opportunity to take part in the care of this patient. If you have any further questions, please contact the neurology consultation attending.  Signed,  Chari Manning, D.O.  Internal Medicine Resident, PGY-3 [facility]  10:01 AM, 05/16/2021

## 2021-05-16 NOTE — ED Triage Notes (Signed)
Pt BIB [facility] from home as Code Stroke. She was LKN @ 1900 last night & friend found her this morning at 0730 stumbling on an injured Left foot (fall a few days ago) with slurred speech & expressive aphasia. EMS reports baseline A/Ox4, uses no assistive devices & lives at home alone, does have hx of open heart surgery & is on Eliquis. 120/77, 100%on RA, A-Fib RVR on their monitor, cbg 153. ?

## 2021-05-16 NOTE — H&P (Signed)
? ? ? ?Date: 05/16/2021     ?     ?     ?Patient Name:  Nicole Mercado MRN: [id number]  ?DOB: 02-Dec-1940 Age / Sex: 81 y.o., female   ?PCP: Kristie Cowman, MD    ?     ?Medical Service: Internal Medicine Teaching Service    ?     ?Attending Physician: Dr. Lorelle Gibbs, DO    ?First Contact: Dr. Corky Sox Pager: [phone]   ?Second Contact: Dr. Rick Duff Pager: [phone]   ?     ?After Hours (After 5p/  First Contact Pager: [phone]  ?weekends / holidays): Second Contact Pager: [phone]  ? ?Chief Complaint: code stroke ? ?History of Present Illness:  ?Nicole Mercado is an 81 year old female with a PMHx of atrial fibrillation on Eliquis, aortic stenosis s/p prosthetic valve, CAD s/p CABG, carotid artery disease s/p CEA, TIA in 2022, PAD, hyperlipidemia, CLL, and chronic thrombocytopenia. She presented to the emergency department as a code stroke for aphasia and L sided facial droop.  ? ?On assessment in the ED, the patient is alert and oriented to location and time, but not to events. She reports being here for a "fever" and is unable to relate the events that occurred prior to arrival at the hospital. Per chart review, the patient was last known well at 7 PM on 5/10. Found by a neighbor the next morning, weak and confused. Initial NIHSS was 6 (scoring 2 for LOC questions, 1 for facial palsy, 1 for LLE weakness, 2 for language). CTH negative. CTA with no LVO. No TNK given out of window and on Eliquis.  ? ?Also of note, patient had a fall injuring her LLE several days prior. Pulses found in the ED.  ? ?Patient expressed preference for being DNR during code status discussion. Called and discussed this with patient's son, Maaliyah Adolph. He states that this is a consistent wish that the patient has expressed and agrees that the patient should be DNR. Code status changed to DNR.  ? ?Meds:  ?Eliquis 5 mg BID ?ASA 81 mg daily ?Cardizem CD 180 mg daily ?Lopressor 100 mg BID  ?Atorvastatin 10 mg  daily ?Oxybutynin 10 mg daily ?   ?Allergies: ?Allergies as of 05/16/2021  ? (No Known Allergies)  ? ?Past Medical History:  ?Diagnosis Date  ? Aortic stenosis   ? a. Echocardiogram (01/11/14):  Mild LVH, EF 60-65%, Gr 1 DD, possible bicuspid AV, severe AS (mean 61 mmHg, peak 104 mmHg), mild MVP of post leaflet, mild MR, PASP 33 mmHg.;  b. 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 Arthritis   ? Atrial fibrillation ([place])   ? post op after CABG+AVR >> Amiodarone  ? CAD (coronary artery disease)   ? a. LHC (01/13/14):  dLM 70 extending into oLAD, mLAD 40-50, pD1 80, pD2 70, ostial/prox OM1 70 >> CABG (L-LAD, S-OM1, S-D1, S-D2)  // Myoview 10/22: Fixed defect anterolateral wall consistent with artifact, EF 71, no ischemia, low risk  ? Carotid artery occlusion   ? a. s/p L CEA 2013;  b.  Carotid US (1/16):  Bilateral ICA 1-39% // Carotid US 10/22: Bilateral ICA 1-39  ? CLL (chronic lymphocytic leukemia) ([place])   ? slow leukemia---Dr  Murinson  ? H/O exercise stress test   ? NSSTT  ? Hx of cardiovascular stress test   ? a. Nuclear stress test That (4/13): Normal perfusion, EF 74%  ? Hx of echocardiogram   ? Echo (2/16):  Mild LVH, EF 60-65%, no RWMA, Gr 2 DD, AVR ok (mean 9 mmHg), trivial AI, mild MR, mild LAE, PASP 40 mmHg  ? Hypertension   ? PAD (peripheral artery disease) ([place])   ? LE Arterial US 10/22: R - pCFA, oSFA, dSFA 30-49; L - pCFA + mSFA 50-74, pPop 30-49 >> referred to [facility]  ? Pancreatitis   ? h/o  ? Thrombocytopenia ([place]) 11/19/2010  ? ? ?Family History:  ?Patient unable to recall ? ?Social History:  ?Denies smoking ever ?Reports drinking 0.5 glasses of wine each weekend day ?Denies ever using illegal drugs ? ?Review of Systems: ?A complete ROS was negative except as per HPI.  ? ?Physical Exam: ?Blood pressure (!) 164/98, pulse (!) 144, temperature (!) 97.5 ?F (36.4 ?C), temperature source Oral, resp. rate 17, height 5' (1.524 m), weight 41.7 kg, SpO2 98 %. ?Constitutional: elderly and frail appearing female lying in bed,  NAD ?HENT: normocephalic and atraumatic, EOMI, conjunctiva normal, moist mucous membranes  ?Cardiovascular: irregular rate and rhythm, S1 and S2 present, no murmurs, rubs, gallops.  Distal pulses intact. ?Respiratory: Effort is normal on room air. CTAB. ?Abdominal: NTTP, +BS ?Musculoskeletal: Normal bulk and tone.  No peripheral edema noted. ?Skin: Warm and dry.  No rash, erythema, lesions noted. ?Psychiatric: Normal mood and affect. Behavior is normal. ? ?Neuro: ?Mental Status: ?Patient is awake, alert, oriented to x2 ?Patient is unable to give a clear and coherent history ?No signs of aphasia or dysarthria: comprehension, naming, and repetition intact ?Cranial Nerves: ?II: Visual Fields are full.  ?III,IV, VI: EOMI without ptosis or diploplia.  ?V: Facial sensation is symmetric to light touch ?VII: Facial movement is symmetric resting and smiling ?VIII: Hearing is intact to voice ?X: untested ?XI: Shoulder shrug is symmetric. ?XII: untested ?Motor: ?Tone is normal. Bulk is normal. 5/5 strength was present in all four extremities.  ?Sensory: ?Sensation is symmetric to light touch in the arms and legs.  ?Cerebellar: ?FNF intact bilaterally ? ?EKG: personally reviewed my interpretation is Afib with RVR ? ?CXR: personally reviewed my interpretation is no acute process ? ?Assessment & Plan by Problem: ?[name] is an 81 year old female with a PMHx of atrial fibrillation on Eliquis, aortic stenosis s/p prosthetic valve, CAD s/p CABG, carotid artery disease s/p CEA, TIA in 2022, PAD, hyperlipidemia, CLL, and chronic thrombocytopenia. She presented to the emergency department as a code stroke for aphasia and L sided facial droop. Symptoms appear to have resolved.  ? ?#Code stroke ?#Likely TIA ?Initial NIHSS was 6 (scoring 2 for LOC questions, 1 for facial palsy, 1 for LLE weakness, 2 for language). CTH negative. CTA with no LVO. No TNK given out of window and on Eliquis. On my exam NIHSS is currently 0 and patient is  without any signs of aphasia or dysarthria. TIA seems likely but MRI will be definitive.  ?-Admit to IMTS, attending Dr. [employer] ?-Neuro consult, stroke team to see ?-MRI-brain pending ?-Hold home Eliquis 5 mg BID ?-Hold home ASA ?-Wait for VTE PPX ?-Permissive HTN x 48 hrs (BP <220/110), PRN Labetalol ?-q4 Neurochecks ?-PT/OT/SLP ?-Failed swallow screen ?-Stroke referral on d/c ? ?#Atrial fibrillation ?Had a CV on 5/3 that was successful. Found to be in RVR on arrival. Given Cardizem 20 mg IV followed by gtt. Currently rates around 110-120.  ?-Continue Cardizem gtt ?-Hold home Cardizem and Lopressor ? 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 ?-Daily BMP ?-Avoid nephrotoxins  ? ?#Chronic  thrombocytopenia ?Plts at 128, appears to be bl.  ? ?FEN:  NPO until speech sees her (failed swallow) ?VTE: SCDs  ?Code status: DNR ?Dispo: TBD, likely home ? ?Dispo: Admit patient to Observation with expected length of stay less than 2 midnights. ? ?Signed: ?Corky Sox, MD ?PGY-1 ?Pager: [phone] ?Please contact the on call pager after 5 pm and on weekends at [phone]. ? ? ?

## 2021-05-16 NOTE — ED Notes (Signed)
greg Stennis (Son) called asking for an update on [place] in 25. His number is [phone]. ?

## 2021-05-16 NOTE — Hospital Course (Signed)
Had a fever she said. Had trouble speaking.  ? ?A&O x3 not to situation.  ? ?Takes eliquis 3 times.  ? ?33 mL of penumbra in the ?left parieto-occipital and high left frontal regions. ? ?Similar between orange and banana, shape.  ? ?100-7, did not know. ? ? ?No cigarettes ?Drink wine 0.5 glass on the weekends.  ?No other drugs.  ? ? ?PMH: ?Severe AS ? ?Medications:  ? ? ? ?SH: Lives alone [place] in [place]  ?Manages own medications.   ?Sons in [place] and [place]. Reach out to [place].  ? ?DNR  ?

## 2021-05-16 NOTE — Consult Note (Signed)
Neurology Attending Attestation ?  ?I examined the patient and discussed plan with resident Dr. Leonides Cave.  I agree with findings and recommendations in the note below. I was present throughout the stroke code and made all significant decisions and personally reviewed CNS imaging and also discussed CTA results with radiologist by phone. ? ?This is an 81 year old patient with a history of aortic stenosis, A-fib on Eliquis, CAD, CLL, PAD, thrombocytopenia who presented with acute onset of severe aphasia and left-sided facial droop.  Last known well was 7 PM the night before.  Stroke scale was 8 for severe aphasia, dysarthria, left facial palsy, drift in left leg.  Her left foot was very swollen and bruised.  Patient was in a flutter on arrival. CT head showed no acute intracranial process.  TNK was not administered due to her being outside of the window and contraindication of therapeutic anticoagulation.  CTA showed no LVO.  CT perfusion was motion limited with evidence of 33 mill penumbra in the left parieto-occipital and high left frontal regions.  Given patient did not have an LVO there was no indication for intervention.  Patient will be admitted for stroke work-up, see below.  Eliquis to be held until stroke ruled out by MRI.  OK to start pharmacologic DVT prophylaxis. Stroke team will continue to follow.  ?  ?  ?Su Monks, MD ?[facility] ?[phone] ?  ?If 7pm- 7am, please page neurology on call as listed in [place]. ? ?NEUROLOGY CONSULTATION NOTE  ? ?Date of service: May 16, 2021 ?Patient Name: Nicole Mercado ?MRN:  [id number] ?DOB:  Feb 23, 1940 ?Reason for consult: "Code stroke" ?Requesting physician: EDP ?_ _ _   _ __   _ __ _ _  __ __   _ __   __ _ ? ?History of Present Illness  ? ?Nicole Mercado is a 81 y.o. female with PMH significant for  has a past medical history of Aortic stenosis, Arthritis, Atrial fibrillation ([place]), CAD (coronary artery disease), Carotid artery occlusion, CLL (chronic  lymphocytic leukemia) ([place]), H/O exercise stress test, cardiovascular stress test, echocardiogram, Hypertension, PAD (peripheral artery disease) ([place]), Pancreatitis, and Thrombocytopenia ([place]) (11/19/2010). who presents with aphasia left-sided facial droop. ? ?Patient was in her usual state of health until a few days ago when she experienced a fall injuring her left lower extremity. I am unsure what precipitated the fall or if she had further evaluation and management of her lower extremity injury.  She does however have a history of recurrent sympathy and orthostatic hypotension as well as atrial fibrillation. Patient apparently lives alone but does have a neighbor who checks in on her periodically.  Patient was last known well yesterday evening at 7 PM.  This morning a neighbor found her weak and confused and called EMS.  Code stroke was initiated .  In the ED the patient was found to be severely aphasic which complicated much of her neuro exam.  She did have some left-sided facial droop and was able to lift her bilateral upper extremities with significant coaxing and assistance. Patient has a history of atrial fibrillation on Eliquis.  I am unsure when she last took this medication.  She was previously seen here on 05/08/2021 for cardioversion sessile and without complications.  However, she presented today with atrial flutter with rapid ventricular response. ? ?Stroke workup this admission: ? ?CTH: Performed without any obvious ICH ? ?MRI brain: Pending ? ?CTA/MRA: Pending ? ?Lipid Panel:  ?Lab Results  ?Component Value Date  ?  [place] 33 03/30/2021  ? ?HgbA1c:  ?Lab Results  ?Component Value Date  ? HGBA1C 5.3 03/30/2021  ? ? ?TTE pending ?  ?ROS  ? ?Per HPI; all other systems reviewed and are negative ? ?Past History  ? ?Past Medical History:  ?Diagnosis Date  ? Aortic stenosis   ? a. Echocardiogram (01/11/14):  Mild LVH, EF 60-65%, Gr 1 DD, possible bicuspid AV, severe AS (mean 61 mmHg, peak 104 mmHg), mild MVP  of post leaflet, mild MR, PASP 33 mmHg.;  b. 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 Arthritis   ? Atrial fibrillation ([place])   ? post op after CABG+AVR >> Amiodarone  ? CAD (coronary artery disease)   ? a. LHC (01/13/14):  dLM 70 extending into oLAD, mLAD 40-50, pD1 80, pD2 70, ostial/prox OM1 70 >> CABG (L-LAD, S-OM1, S-D1, S-D2)  // Myoview 10/22: Fixed defect anterolateral wall consistent with artifact, EF 71, no ischemia, low risk  ? Carotid artery occlusion   ? a. s/p L CEA 2013;  b.  Carotid US (1/16):  Bilateral ICA 1-39% // Carotid US 10/22: Bilateral ICA 1-39  ? CLL (chronic lymphocytic leukemia) ([place])   ? slow leukemia---Dr  Murinson  ? H/O exercise stress test   ? NSSTT  ? Hx of cardiovascular stress test   ? a. Nuclear stress test That (4/13): Normal perfusion, EF 74%  ? Hx of echocardiogram   ? Echo (2/16):  Mild LVH, EF 60-65%, no RWMA, Gr 2 DD, AVR ok (mean 9 mmHg), trivial AI, mild MR, mild LAE, PASP 40 mmHg  ? Hypertension   ? PAD (peripheral artery disease) ([place])   ? LE Arterial US 10/22: R - pCFA, oSFA, dSFA 30-49; L - pCFA + mSFA 50-74, pPop 30-49 >> referred to [facility]VS  ? Pancreatitis   ? h/o  ? Thrombocytopenia ([place]) 11/19/2010  ? ?Past Surgical History:  ?Procedure Laterality Date  ? ABDOMINAL HYSTERECTOMY    ? AORTIC VALVE REPLACEMENT [name] 01/17/2014  ? Procedure: AORTIC VALVE REPLACEMENT (AVR);  Surgeon: Gaye Pollack, MD;  Location: [facility];  Service: Open Heart Surgery;  Laterality: [name];  ? APPENDECTOMY    ? CARDIOVERSION [name] 04/04/2021  ? Procedure: CARDIOVERSION;  Surgeon: [name], DO;  Location: [facility];  Service: Cardiovascular;  Laterality: [name];  ? CARDIOVERSION [name] 05/08/2021  ? Procedure: CARDIOVERSION;  Surgeon: [name], DO;  Location: [facility];  Service: Cardiovascular;  Laterality: [name];  ? CAROTID ENDARTERECTOMY  06/09/11  ? LEFT  cea  ? CESAREAN SECTION    ? FIVE  ? CHOLECYSTECTOMY    ? CORONARY ARTERY BYPASS GRAFT [name] 01/17/2014  ? Procedure: CORONARY ARTERY BYPASS GRAFTING  (CABG)TIMES FOUR USING LEFT INTERNAL MAMMARY ARTERY AND RIGHT SAPHENOUS VEIN HARVESTED ENDOSCOPICALLY;  Surgeon: Gaye Pollack, MD;  Location: [facility] OR;  Service: Open Heart Surgery;  Laterality: [name];  ? ENDARTERECTOMY  06/09/2011  ? Procedure: ENDARTERECTOMY CAROTID;  Surgeon: Rosetta Posner, MD;  Location: [facility];  Service: Vascular;  Laterality: Left;  left carotid endarterectomy with patch angioplasty  ? LEFT AND RIGHT HEART CATHETERIZATION WITH CORONARY ANGIOGRAM [name] 01/13/2014  ? Procedure: LEFT AND RIGHT HEART CATHETERIZATION WITH CORONARY ANGIOGRAM;  Surgeon: Jettie Booze, MD;  Location[facility];  Service: Cardiovascular;  Laterality: [name];  ? TEE WITHOUT CARDIOVERSION [name] 01/17/2014  ? Procedure: TRANSESOPHAGEAL ECHOCARDIOGRAM (TEE);  Surgeon: Gaye Pollack, MD;  Location: [facility];  Service: Open Heart Surgery;  Laterality: [name];  ? TEE WITHOUT CARDIOVERSION [name]  04/04/2021  ? Procedure: TRANSESOPHAGEAL ECHOCARDIOGRAM (TEE);  Surgeon: [name], DO;  Location: [facility] ENDOSCOPY;  Service: Cardiovascular;  Laterality: [name];  ? TONSILLECTOMY    ? ?Family History  ?Problem Relation Age of Onset  ? Heart disease Mother   ?     in her 77s  ? Hypertension Mother   ? Heart attack Mother   ? Heart disease Father   ? Hypertension Father   ? Hypertension Sister   ? Hypertension Son   ? Stroke Paternal Grandmother   ? Stroke Paternal Grandfather   ? ?Social History  ? ?Socioeconomic History  ? Marital status: Widowed  ?  Spouse name: Not on file  ? Number of children: Not on file  ? Years of education: Not on file  ? Highest education level: Not on file  ?Occupational History  ? Not on file  ?Tobacco Use  ? Smoking status: Never  ? Smokeless tobacco: Never  ?Vaping Use  ? Vaping Use: Never used  ?Substance and Sexual Activity  ? Alcohol use: No  ? Drug use: No  ? Sexual activity: Not on file  ?Other Topics Concern  ? Not on file  ?Social History Narrative  ? Not on file  ? ?Social Determinants of Health  ? ?Financial  Resource Strain: Not on file  ?Food Insecurity: Not on file  ?Transportation Needs: Not on file  ?Physical Activity: Not on file  ?Stress: Not on file  ?Social Connections: Not on file  ? ?No Known Allergies ? ?Medi

## 2021-05-16 NOTE — Code Documentation (Signed)
Stroke Response Nurse Documentation ?Code Documentation ? ?Nicole Mercado is a 81 y.o. female arriving to [facility]  via [facility] on 05/16/21 with past medical hx of CAD with CABG and AVR in 01/2014, HTN, HLD, carotid disease with Lt CEA 2013 CLL, chronic thrombocytopenia, persistent atrial fibrillation, recurrent syncope, orthostatic hypotension . On Eliquis (apixaban) daily. Code stroke was activated by EMS.  ? ?Patient from home alone where she was LKW at 1900 05/15/21 and now complaining of aphasia, slurred speech. Patient was found this am by her friend with aphasia and slurred speech. She had a fall a few days ago and has been limping around her home. On arrival to ED pt's foot is swollen, bruised and deformed. Pt has not been seen for it.   ? ?Stroke team at the bedside on patient arrival. Labs drawn and patient cleared for CT by Dr. Dina Rich. Patient to CT with team. NIHSS 6, see documentation for details and code stroke times. Patient with disoriented, right facial droop, Expressive aphasia , and dysarthria  on exam. The following imaging was completed:  CT Head, CTA, and CTP. Patient is not a candidate for IV Thrombolytic due to on blood thinner and outside the window for TNK. Patient is not a candidate for IR due to no LVO per MD. Pt in Afib with rate in the 140's on arrival. CBG WNL.  ? ?Care Plan: Q2x12 then Q4 neuro checks/vitals, MRI, permissive HTN 220/120.   ? ?Bedside handoff with ED RN Arby Barrette.   ? ?Domenique Southers, Rande Brunt  ?Stroke Response RN ?  ?

## 2021-05-17 ENCOUNTER — Observation Stay (HOSPITAL_COMMUNITY): Payer: Medicare Other

## 2021-05-17 DIAGNOSIS — R4701 Aphasia: Secondary | ICD-10-CM | POA: Diagnosis not present

## 2021-05-17 DIAGNOSIS — G459 Transient cerebral ischemic attack, unspecified: Principal | ICD-10-CM

## 2021-05-17 DIAGNOSIS — I4891 Unspecified atrial fibrillation: Secondary | ICD-10-CM | POA: Diagnosis not present

## 2021-05-17 LAB — GLUCOSE, CAPILLARY
Glucose-Capillary: 100 mg/dL — ABNORMAL HIGH (ref 70–99)
Glucose-Capillary: 110 mg/dL — ABNORMAL HIGH (ref 70–99)
Glucose-Capillary: 126 mg/dL — ABNORMAL HIGH (ref 70–99)
Glucose-Capillary: 154 mg/dL — ABNORMAL HIGH (ref 70–99)
Glucose-Capillary: 164 mg/dL — ABNORMAL HIGH (ref 70–99)
Glucose-Capillary: 98 mg/dL (ref 70–99)
Glucose-Capillary: 98 mg/dL (ref 70–99)

## 2021-05-17 LAB — BASIC METABOLIC PANEL
Anion gap: 12 (ref 5–15)
BUN: 13 mg/dL (ref 8–23)
CO2: 24 mmol/L (ref 22–32)
Calcium: 10.1 mg/dL (ref 8.9–10.3)
Chloride: 102 mmol/L (ref 98–111)
Creatinine, Ser: 1.21 mg/dL — ABNORMAL HIGH (ref 0.44–1.00)
GFR, Estimated: 45 mL/min — ABNORMAL LOW (ref >60)
Glucose, Bld: 174 mg/dL — ABNORMAL HIGH (ref 70–99)
Potassium: 3.2 mmol/L — ABNORMAL LOW (ref 3.5–5.1)
Sodium: 138 mmol/L (ref 135–145)

## 2021-05-17 LAB — URINE CULTURE: Culture: <10000 — AB

## 2021-05-17 LAB — LIPID PANEL
Cholesterol: 131 mg/dL (ref 0–200)
HDL: 44 mg/dL (ref >40)
LDL Cholesterol: 63 mg/dL (ref 0–99)
Total CHOL/HDL Ratio: 3 RATIO
Triglycerides: 122 mg/dL (ref <150)
VLDL: 24 mg/dL (ref 0–40)

## 2021-05-17 LAB — HEMOGLOBIN A1C
Hgb A1c MFr Bld: 5 % (ref 4.8–5.6)
Mean Plasma Glucose: 96.8 mg/dL

## 2021-05-17 LAB — CBC
HCT: 41.3 % (ref 36.0–46.0)
Hemoglobin: 14.1 g/dL (ref 12.0–15.0)
MCH: 31.7 pg (ref 26.0–34.0)
MCHC: 34.1 g/dL (ref 30.0–36.0)
MCV: 92.8 fL (ref 80.0–100.0)
Platelets: 143 10*3/uL — ABNORMAL LOW (ref 150–400)
RBC: 4.45 MIL/uL (ref 3.87–5.11)
RDW: 12.7 % (ref 11.5–15.5)
WBC: 6.4 10*3/uL (ref 4.0–10.5)
nRBC: 0 % (ref 0.0–0.2)

## 2021-05-17 IMAGING — CT CT HEAD W/O CM
4 series · 17 of 47 positions shown, 19 images · non-contrast
Comparison: [DATE]

CLINICAL DATA: Trauma, fall



[Series 3: head wo · axial · 0.42mm/px · z∈[-96,+19]mm · 7 of 31 slices shown, 9 images]
[im 4/31  brain]
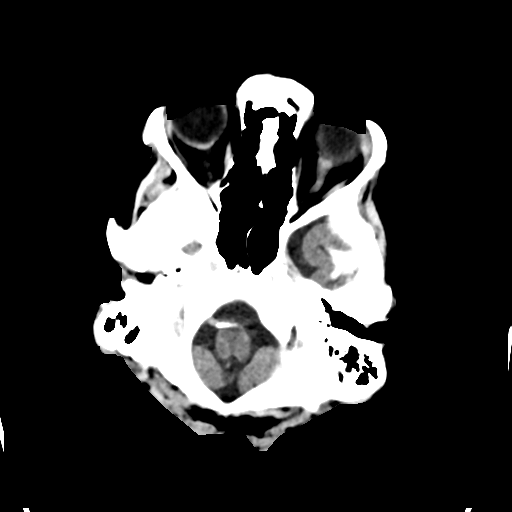
[im 4/31  bone]
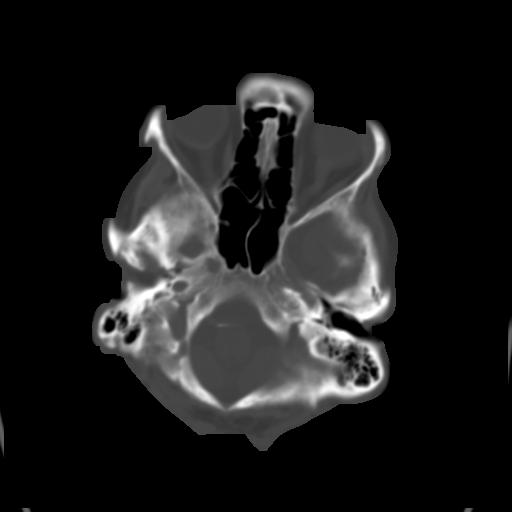
[im 8/31  brain]
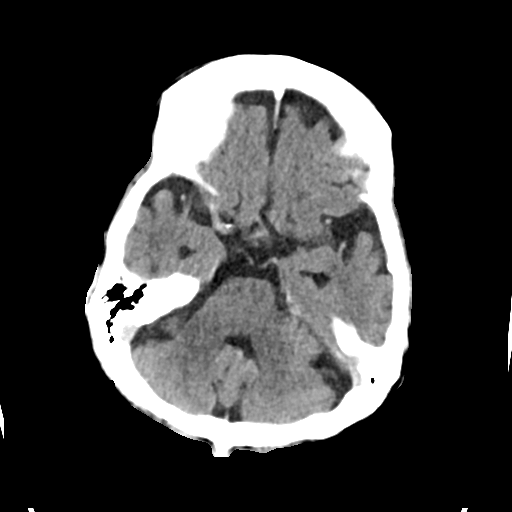
[im 12/31  brain]
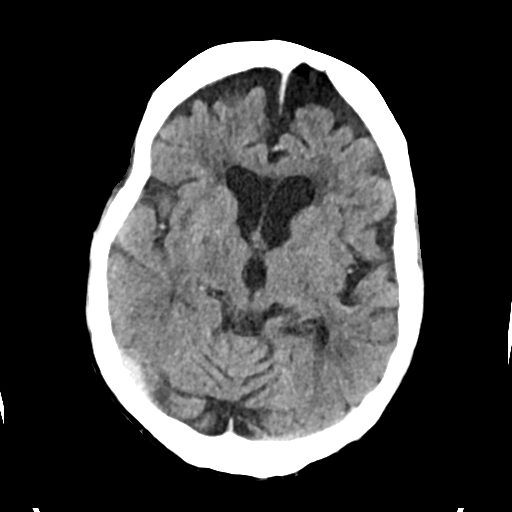
[im 16/31  brain]
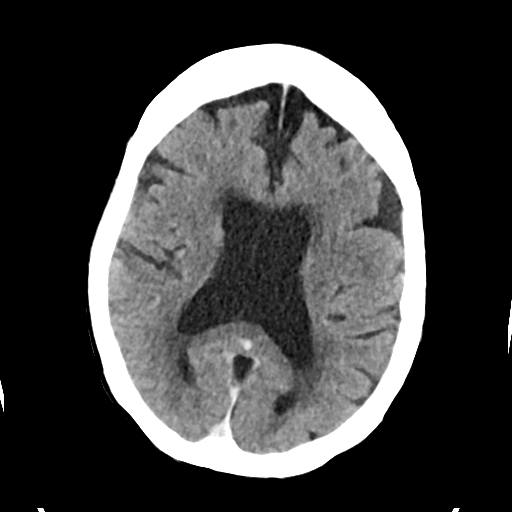
[im 19/31  brain]
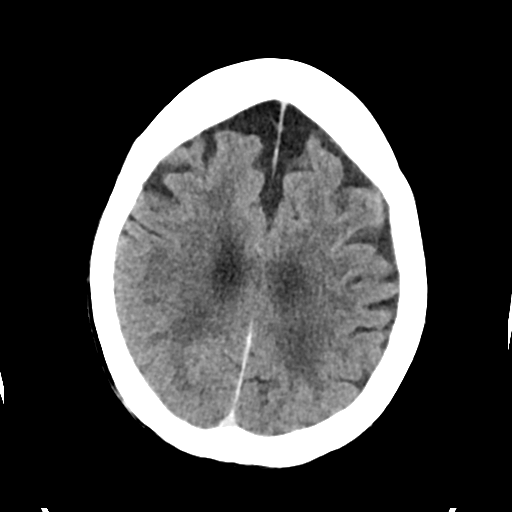
[im 19/31  bone]
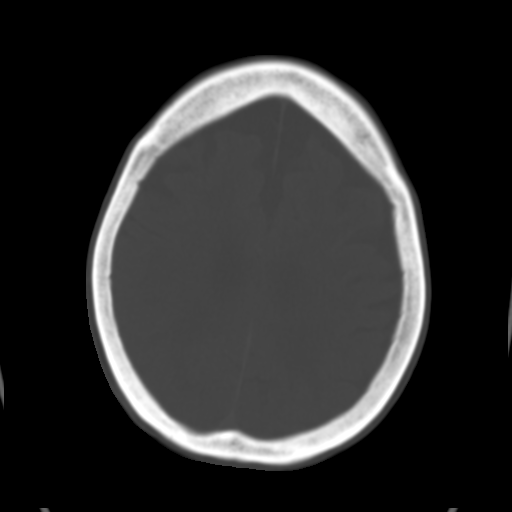
[im 23/31  brain]
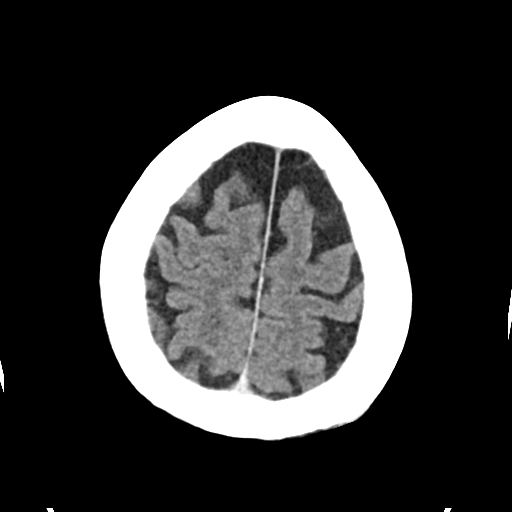
[im 27/31  brain]
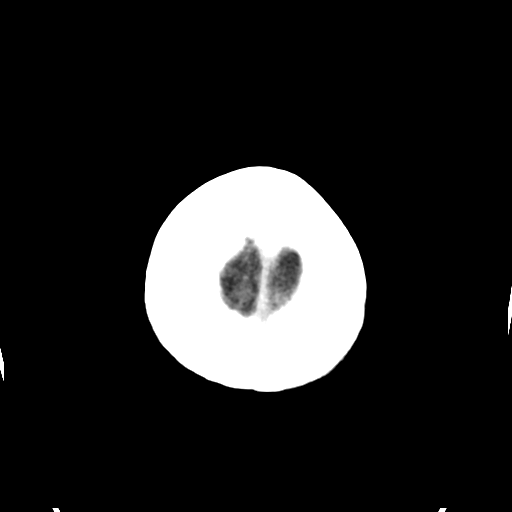

[Series 4: head bone · axial · 0.42mm/px · z∈[-97,-45]mm · 4 of 76 slices shown]
[im 8/76  bone]
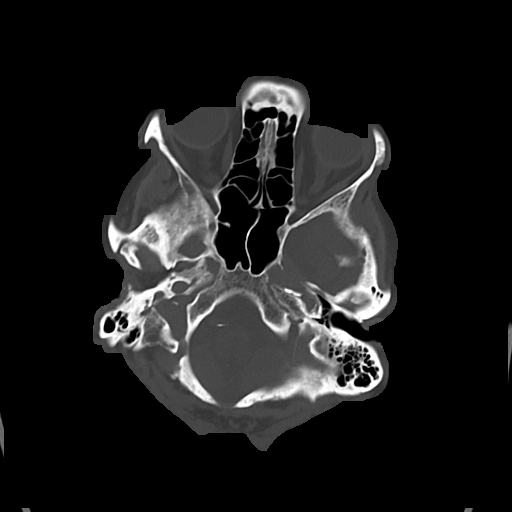
[im 16/76  bone]
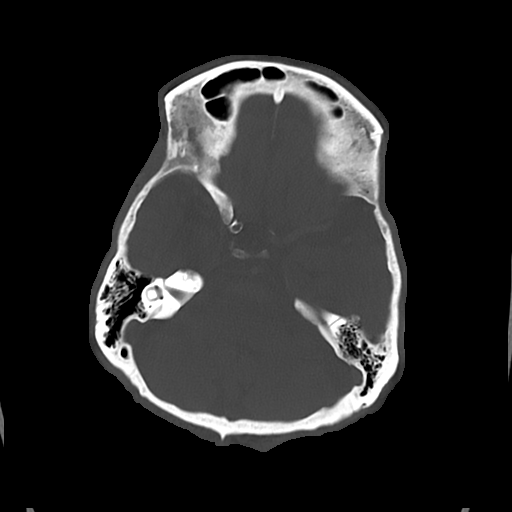
[im 23/76  bone]
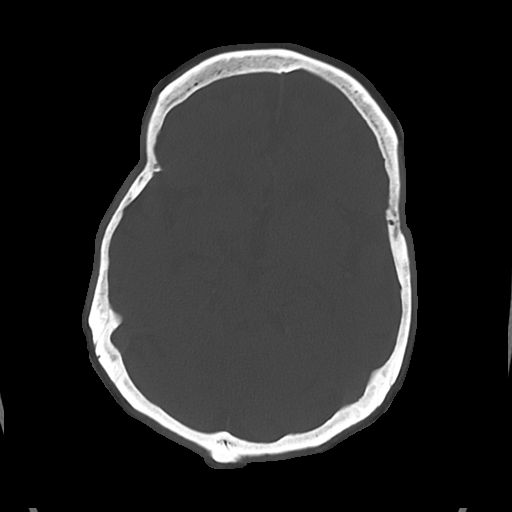
[im 34/76  bone]
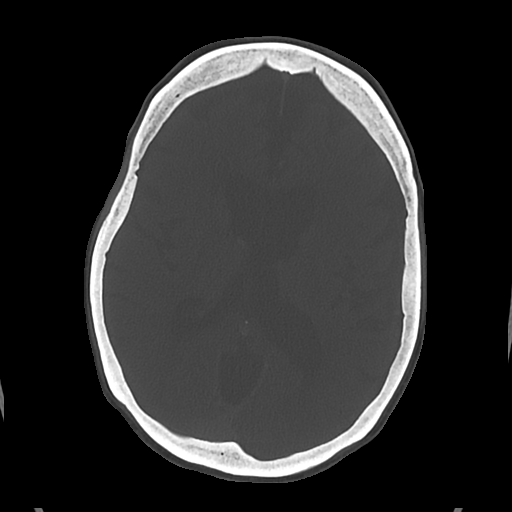

[Series 5: cor soft · coronal · 0.37mm/px · 3 of 69 slices shown]
[im 23/69  brain]
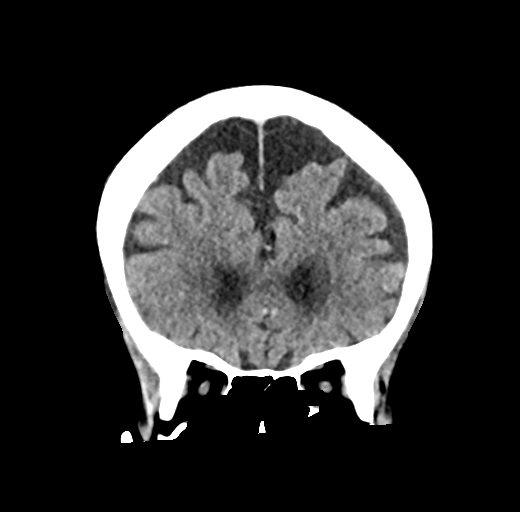
[im 31/69  brain]
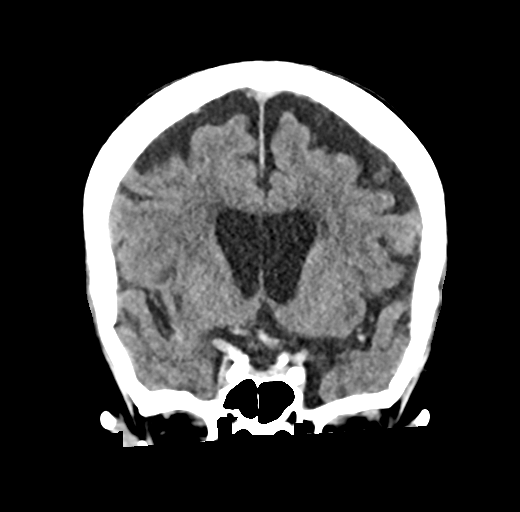
[im 38/69  brain]
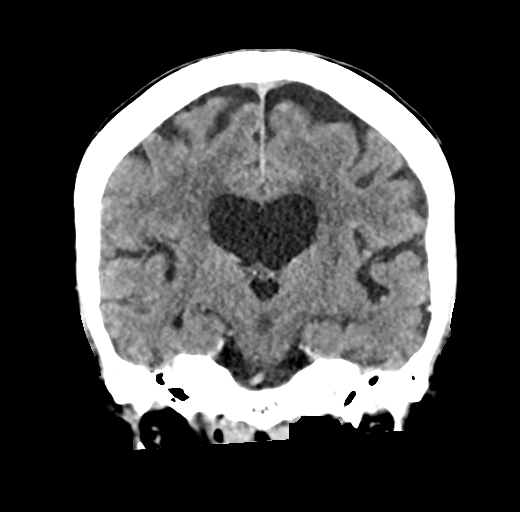

[Series 6: sag soft · sagittal · 0.37mm/px · 3 of 54 slices shown]
[im 18/54  brain]
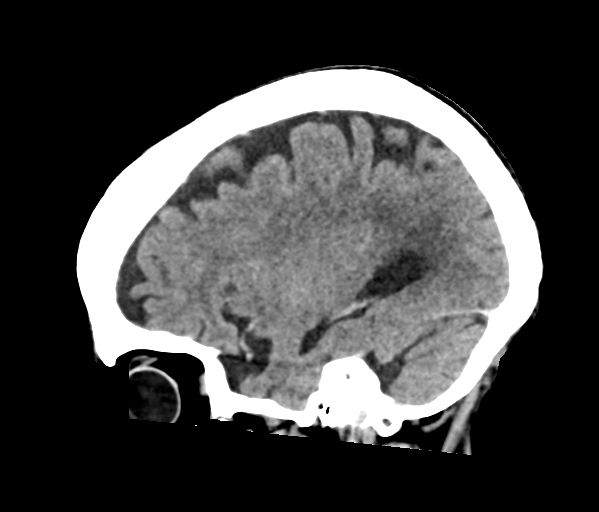
[im 27/54  brain]
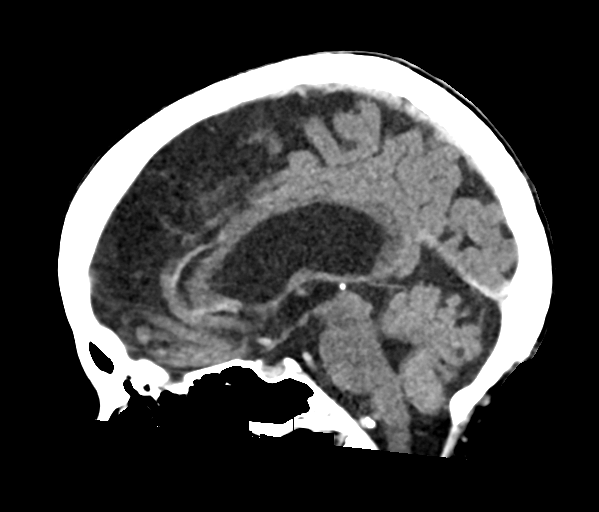
[im 36/54  brain]
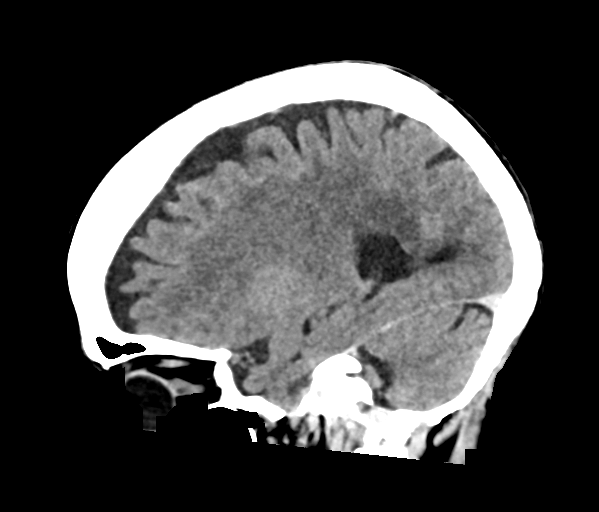

[17 of 47 positions shown; findings below may reference images not displayed]

FINDINGS: Brain: No acute intracranial findings are seen. Cortical sulci are
prominent. There is decreased density in the periventricular white
matter.

Vascular: There are scattered arterial calcifications.

Skull: No fracture is seen in the calvarium.

Sinuses/Orbits: Unremarkable.

Other: None
IMPRESSION: No acute intracranial findings are seen in noncontrast CT brain.
Atrophy. Small-vessel disease.

## 2021-05-17 IMAGING — CT CT ANGIO HEAD-NECK (W OR W/O PERF)
1 of 11 series · 5 of 33 positions shown · IV contrast (OMNI 350)
Comparison: Brain MRI obtained earlier the same day, CT/CTA head
and neck dated 1 day prior

CLINICAL DATA: Stroke suspected, neuro deficit

EXAM:
CT ANGIOGRAPHY HEAD AND NECK
TECHNIQUE: Multidetector CT imaging of the head and neck was performed using
the standard protocol during bolus administration of intravenous
contrast. Multiplanar CT image reconstructions and MIPs were
obtained to evaluate the vascular anatomy. Carotid stenosis
measurements (when applicable) are obtained utilizing NASCET
criteria, using the distal internal carotid diameter as the
denominator.

[Series 11: cta neck axial · axial · 0.39mm/px · z∈[-246,-29]mm · 5 of 323 slices shown]
[im 54/323  soft-tissue]
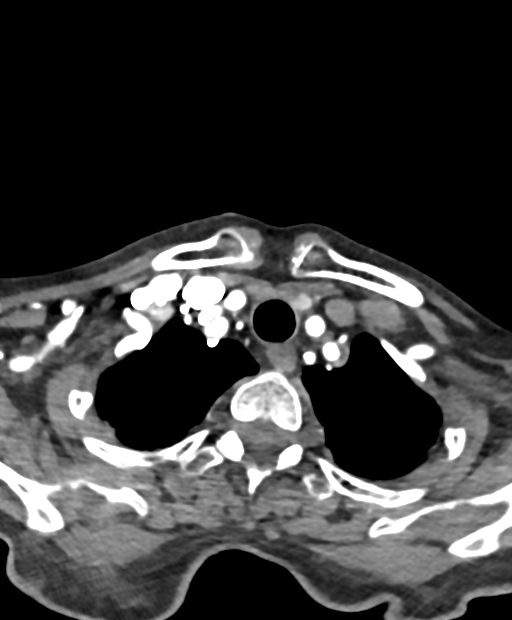
[im 108/323  bone]
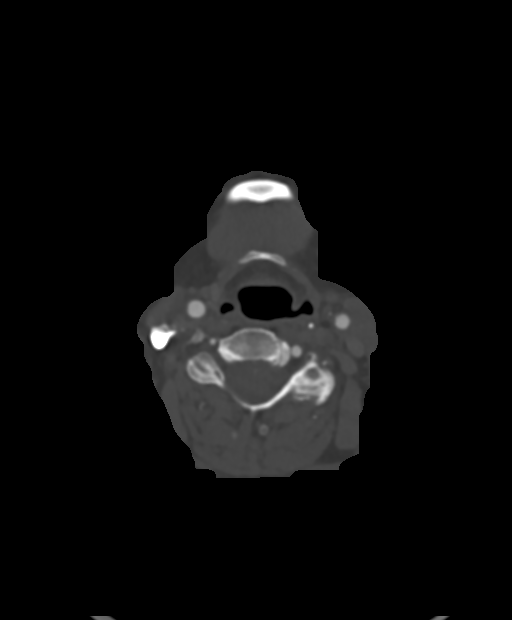
[im 162/323  soft-tissue]
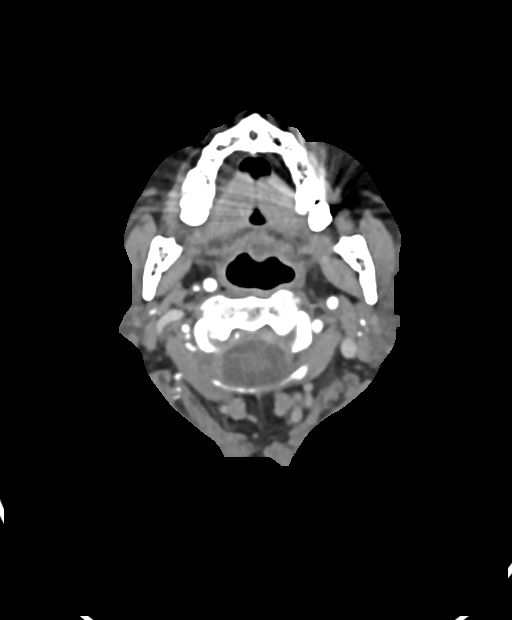
[im 215/323  bone]
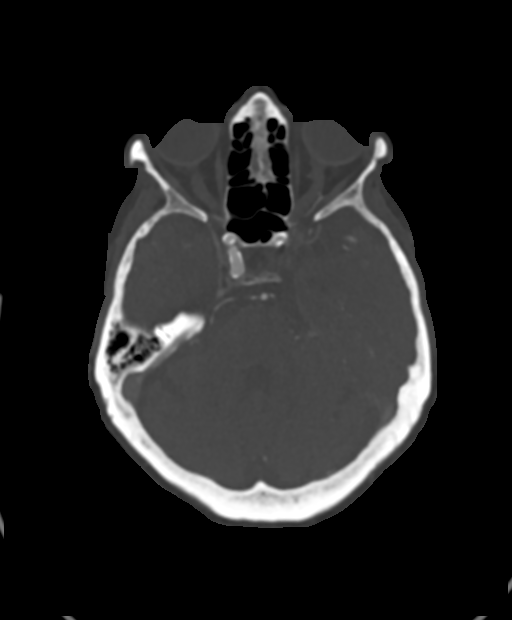
[im 269/323  soft-tissue]
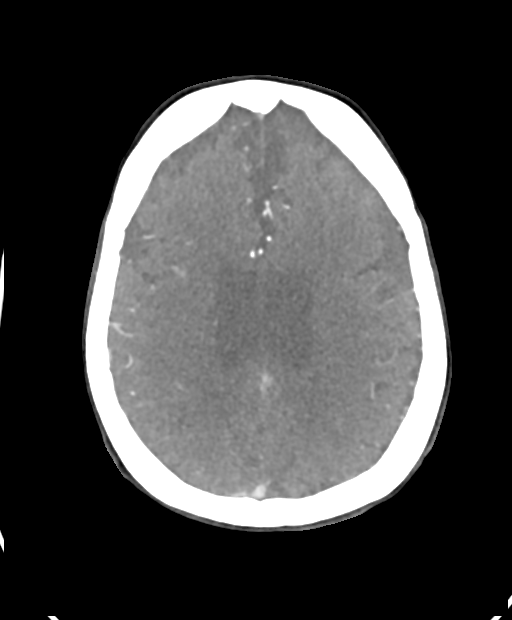

[5 of 33 positions shown; findings below may reference images not displayed]

RADIATION DOSE REDUCTION: This exam was performed according to the
departmental dose-optimization program which includes automated
exposure control, adjustment of the mA and/or kV according to
patient size and/or use of iterative reconstruction technique.

CONTRAST:  75mL OMNIPAQUE IOHEXOL 350 MG/ML SOLN
FINDINGS: CT HEAD FINDINGS

Brain: There is no acute intracranial hemorrhage, extra-axial fluid
collection, or acute infarct.

There is unchanged global parenchymal volume loss with prominence of
the ventricular system and extra-axial CSF spaces. Confluent
hypodensity in the subcortical and periventricular white matter
likely reflects sequela of chronic white matter microangiopathy,
also unchanged.

There is no mass lesion.  There is no mass effect or midline shift.

Vascular: See below

Skull: Normal. Negative for fracture or focal lesion.

Sinuses: The paranasal sinuses are clear.

Orbits: Bilateral lens implants are in place. The globes and orbits
are otherwise unremarkable.

Review of the MIP images confirms the above findings

CTA NECK FINDINGS

Aortic arch: There is mild calcified atherosclerotic plaque of the
aortic arch. The origin of the brachiocephalic artery is not
included within the field of view. The origins of the left common
carotid and subclavian arteries are patent. The subclavian arteries
are patent to the level imaged.

Right carotid system: The right common carotid artery is patent.
There is calcified plaque of the bifurcation resulting in less than
50% stenosis of the proximal right internal carotid artery. The
right external carotid artery branches are diminutive with near
occlusion at the level of the bifurcation, unchanged.

Left carotid system: There is mild stenosis at the origin of the
common carotid artery. Surgical clips about the left carotid
bifurcation are consistent with the history of endarterectomy. The
left common, internal, and external carotid arteries are patent with
minimal plaque and irregularity at the bifurcation and mild plaque
in the distal cervical internal carotid artery but no
hemodynamically significant stenosis or occlusion. There is no
evidence of dissection or aneurysm.

Vertebral arteries: The left vertebral artery is dominant, a normal
variant. There is focal plaque in the V2 segment resulting in
moderate stenosis, unchanged (11-193). Otherwise, the vertebral
arteries are patent, without hemodynamically significant stenosis or
occlusion. There is no dissection or aneurysm.

Skeleton: There is no acute osseous abnormality or suspicious
osseous lesion. There is no visible canal hematoma.

Other neck: The soft tissues are unremarkable.

Upper chest: There is a left pleural effusion, incompletely imaged.

Review of the MIP images confirms the above findings

CTA HEAD FINDINGS

Anterior circulation: There is calcified atherosclerotic plaque in
the intracranial ICAs resulting in mild stenosis bilaterally,
unchanged.

There is a fenestration in the left M1 segment (12-96). The
bilateral MCAs are patent.)

The bilateral ACAs are patent with an unchanged hypoplastic/absent
left A1 segment.

There is no aneurysm or AVM.  Right vertebral artery

Posterior circulation: There is a PICA termination of the right
vertebral artery, unchanged. There is calcified atherosclerotic
plaque in the left V4 segment resulting in up to moderate stenosis.
The distal left V4 segment is patent. PICA is on the left. The
basilar artery is patent.

The bilateral PCAs are patent with focal stenosis of the P2 segment
(12-126). The posterior communicating arteries are identified.

There is no aneurysm or AVM.

Venous sinuses: As permitted by contrast timing, patent.

Anatomic variants: As above.

Review of the MIP images confirms the above findings
IMPRESSION: 1. Stable noncontrast head CT with no acute intracranial pathology.
2. No emergent large vessel occlusion. No significant change since
the study from 1 day prior.
3. Unchanged calcified atherosclerotic at the bilateral carotid
bifurcations resulting in less than 50% stenosis, focal moderate
stenoses of the left V2 and V4 segments, and moderate stenosis of
the left P2 segment.

## 2021-05-17 IMAGING — MR MR HEAD W/O CM
10 of 16 series · 22 of 48 positions shown · non-contrast
Comparison: Prior CT from [DATE].

CLINICAL DATA: Initial evaluation for neuro deficit, stroke
suspected.

EXAM:
MRI HEAD WITHOUT CONTRAST
TECHNIQUE: Multiplanar, multiecho pulse sequences of the brain and surrounding
structures were obtained without intravenous contrast.

[Series 2: DWI · axial · 3.0mm · 0.94mm/px · z∈[-136,+9]mm · 4 of 100 slices shown (1 of 2)]
[im 1/100]
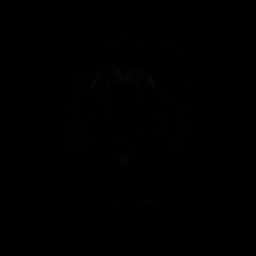
[im 34/100]
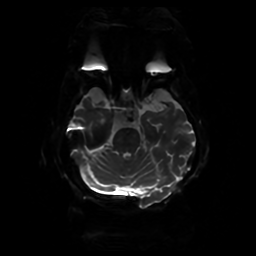
[im 67/100]
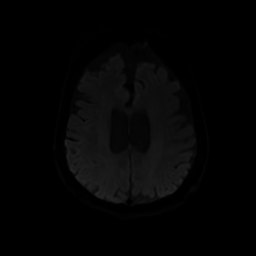
[im 100/100]
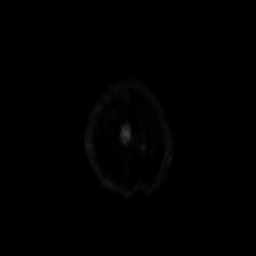

[Series 3: DWI · coronal · 4.0mm · 0.94mm/px · 4 of 74 slices shown (2 of 2)]
[im 1/74]
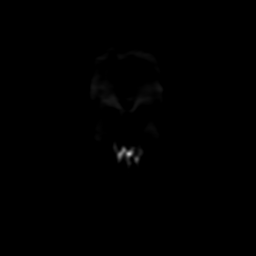
[im 25/74]
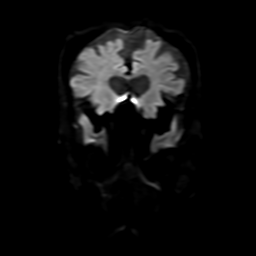
[im 49/74]
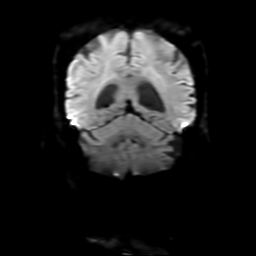
[im 74/74]
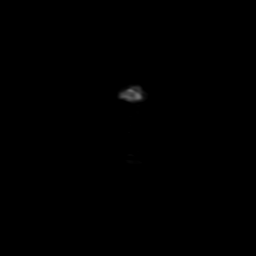

[Series 4: FLAIR · sagittal · 5.0mm · 0.23mm/px · 1 of 25 slices shown (1 of 3)]
[im 1/25]
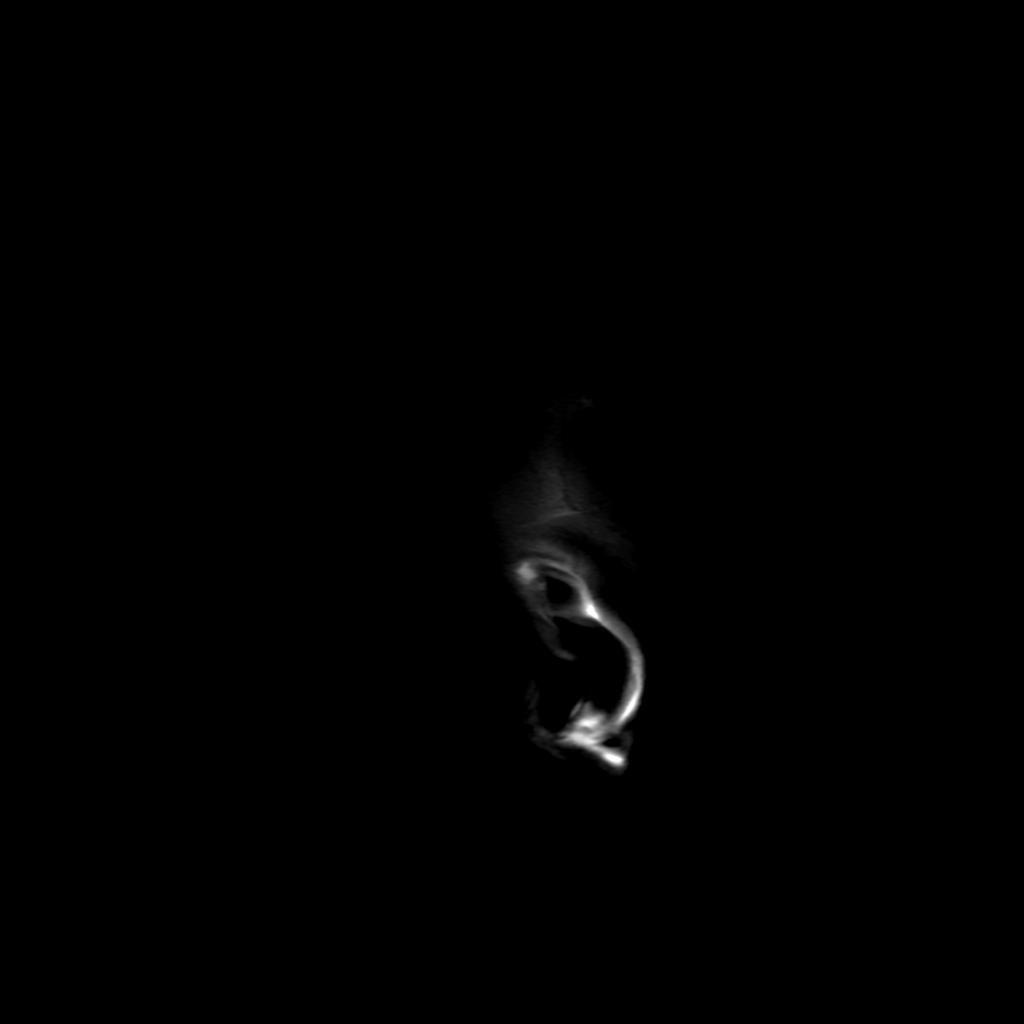

[Series 5: T2 · axial · 5.0mm · 0.23mm/px · 1 of 26 slices shown (1 of 3)]
[im 1/26]
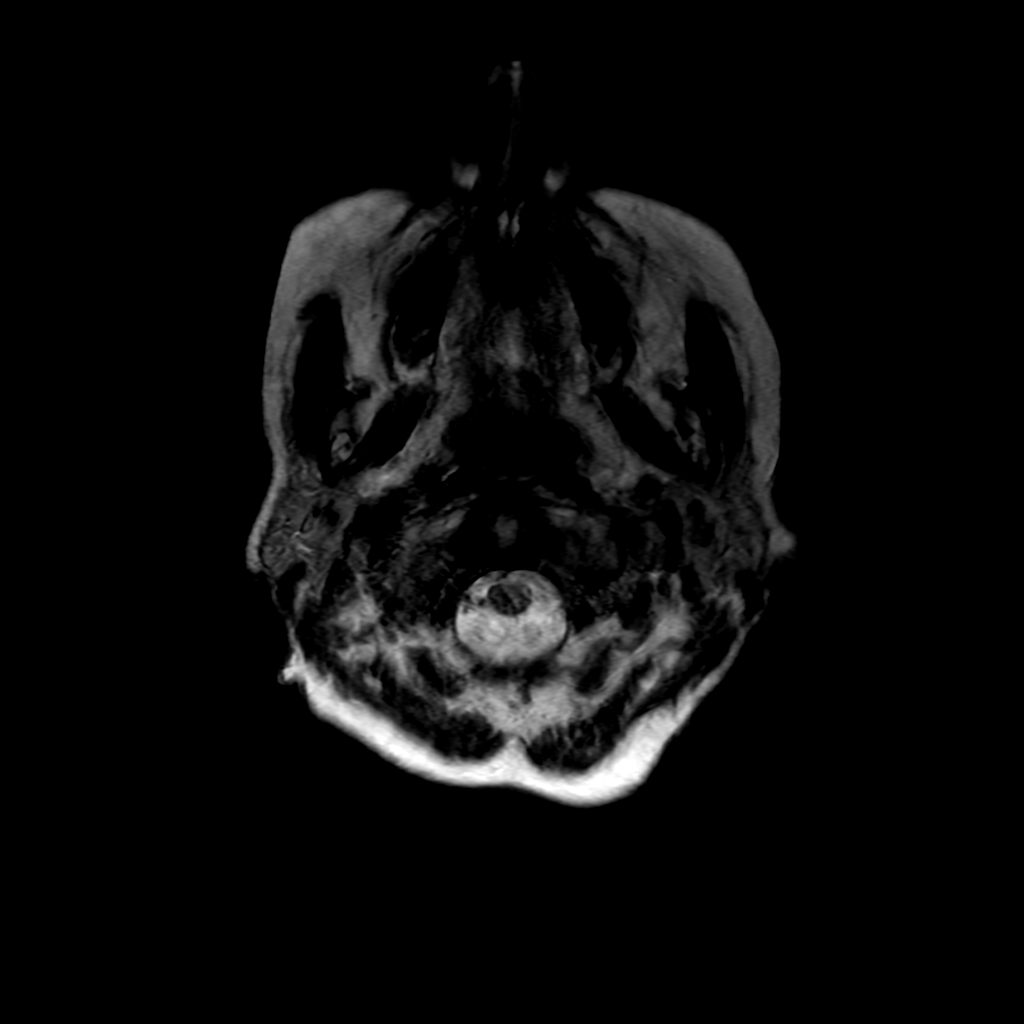

[Series 6: FLAIR · axial · 4.0mm · 0.45mm/px · z∈[-131,+18]mm · 2 of 35 slices shown (2 of 3)]
[im 1/35]
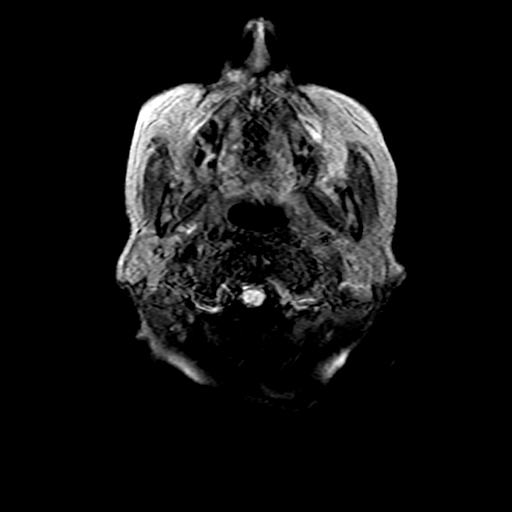
[im 35/35]
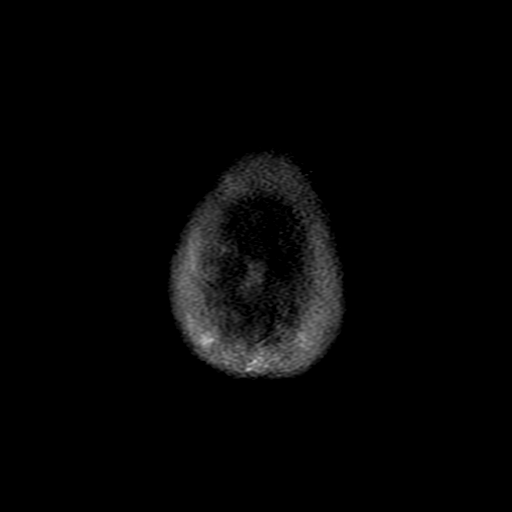

[Series 9: T2 · coronal · 5.0mm · 0.39mm/px · 2 of 33 slices shown (2 of 3)]
[im 1/33]
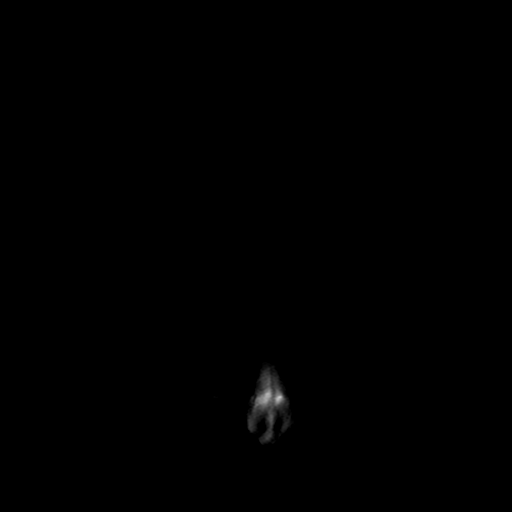
[im 33/33]
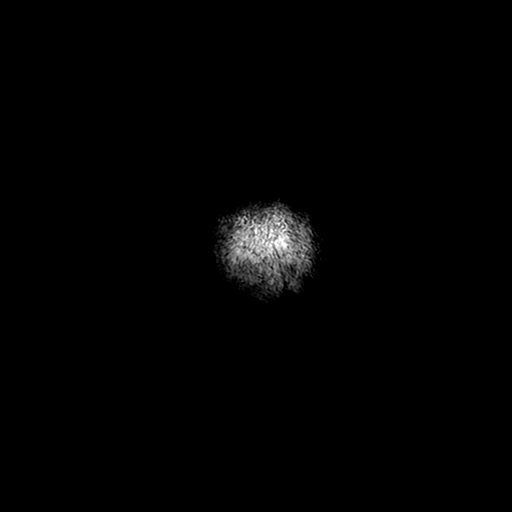

[Series 10: FLAIR · axial · 4.0mm · 0.45mm/px · z∈[-127,+30]mm · 2 of 37 slices shown (3 of 3)]
[im 1/37]
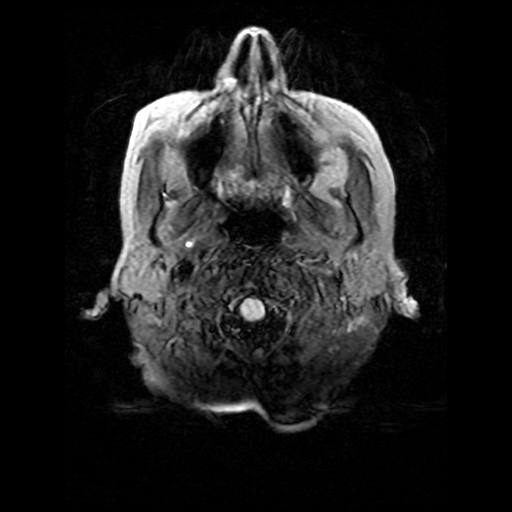
[im 37/37]
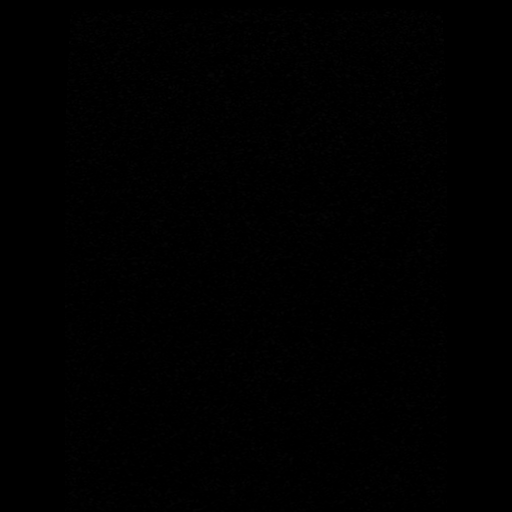

[Series 12: T2 · coronal · 5.0mm · 0.39mm/px · 1 of 33 slices shown (3 of 3)]
[im 1/33]
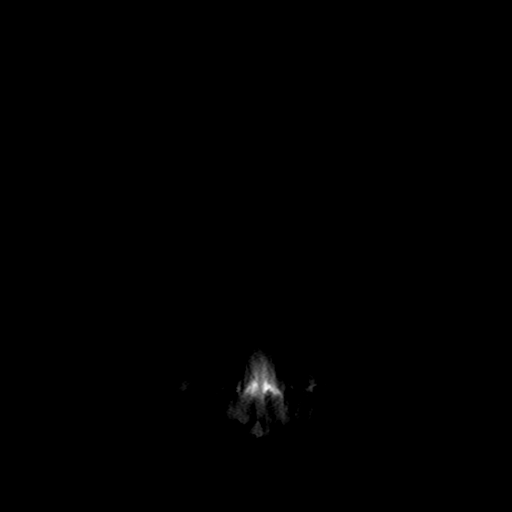

[Series 250: ADC · axial · 3.0mm · 0.94mm/px · z∈[-136,+9]mm · 3 of 50 slices shown (1 of 2)]
[im 1/50]
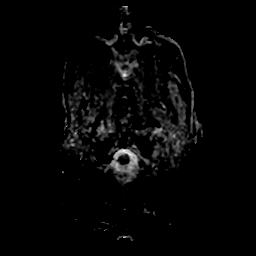
[im 25/50]
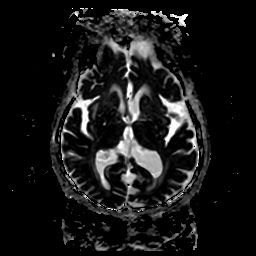
[im 50/50]
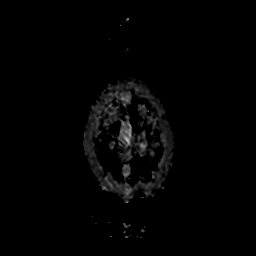

[Series 350: ADC · coronal · 4.0mm · 0.94mm/px · 2 of 37 slices shown (2 of 2)]
[im 1/37]
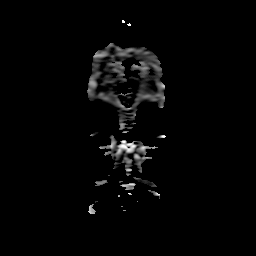
[im 37/37]
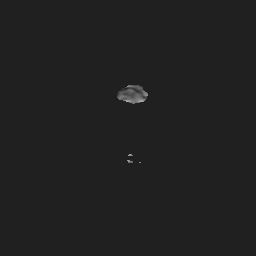

[22 of 48 positions shown; findings below may reference images not displayed]

FINDINGS: Brain: Examination moderately degraded by motion.

Generalized age-related cerebral atrophy. Patchy and confluent
T2/FLAIR hyperintensity involving the periventricular and deep white
matter, most consistent with chronic small vessel ischemic disease,
mild to moderate in nature. Few small remote right cerebellar
infarcts noted.

No abnormal foci of restricted diffusion to suggest acute or
subacute ischemia. Gray-white matter differentiation maintained. No
areas of chronic cortical infarction. No visible acute or chronic
intracranial blood products.

No mass lesion, midline shift or mass effect. No hydrocephalus or
extra-axial fluid collection. Pituitary gland and suprasellar region
grossly normal. Midline structures intact and normally formed.

Vascular: Major intracranial vascular flow voids are maintained.

Skull and upper cervical spine: Craniocervical junction within
normal limits. Bone marrow signal intensity normal. No scalp soft
tissue abnormality.

Sinuses/Orbits: Prior bilateral ocular lens replacement. Paranasal
sinuses are clear. No significant mastoid effusion.

Other: None.
IMPRESSION: 1. No acute intracranial abnormality.
2. Age-related cerebral atrophy with mild to moderate chronic small
vessel ischemic disease. Few small remote right cerebellar infarcts.

## 2021-05-17 MED ORDER — METOPROLOL TARTRATE 50 MG PO TABS
100.0000 mg | ORAL_TABLET | Freq: Two times a day (BID) | ORAL | Status: DC
  Administered 2021-05-17 – 2021-05-21 (×9): 100 mg via ORAL
  Filled 2021-05-17 (×9): qty 2

## 2021-05-17 MED ORDER — DILTIAZEM HCL ER COATED BEADS 180 MG PO CP24
180.0000 mg | ORAL_CAPSULE | Freq: Every day | ORAL | Status: DC
  Administered 2021-05-17 – 2021-05-21 (×5): 180 mg via ORAL
  Filled 2021-05-17 (×5): qty 1

## 2021-05-17 MED ORDER — IOHEXOL 350 MG/ML SOLN
75.0000 mL | Freq: Once | INTRAVENOUS | Status: AC | PRN
  Administered 2021-05-17: 75 mL via INTRAVENOUS

## 2021-05-17 MED ORDER — APIXABAN 2.5 MG PO TABS
2.5000 mg | ORAL_TABLET | Freq: Two times a day (BID) | ORAL | Status: DC
  Administered 2021-05-17 – 2021-05-21 (×9): 2.5 mg via ORAL
  Filled 2021-05-17 (×9): qty 1

## 2021-05-17 MED ORDER — POTASSIUM CHLORIDE 20 MEQ PO PACK
40.0000 meq | PACK | Freq: Two times a day (BID) | ORAL | Status: AC
  Administered 2021-05-17 – 2021-05-18 (×2): 40 meq via ORAL
  Filled 2021-05-17 (×2): qty 2

## 2021-05-17 MED ORDER — APIXABAN 5 MG PO TABS: 5.0000 mg | ORAL_TABLET | Freq: Two times a day (BID) | ORAL | Status: DC

## 2021-05-17 NOTE — Progress Notes (Addendum)
Paged at 6:36 om by RN that she was found on the floor after responding to her bed alarm. RN reports that she hit her head, had notes a "bump on her head" and some bleeding on hair.  ? ?She was examined at bedside.  ? ?A: ?She is laying in bed comfortably and is A&O x3. She is interactive and follows commands. On exam, she has a 2x2 cm hematoma with some dry blood stuck onto her head with no apparent ongoing blood loss.  ? ?P: ?Given elderly lady with unwitnessed fall resulting in head injury, bleeding, and on anticoagulation, will obtain STAT head CT to r/o acute intracranial abnormality.  ?- Follow up STAT CT head  ? ?

## 2021-05-17 NOTE — Progress Notes (Signed)
Patient to CT.

## 2021-05-17 NOTE — Procedures (Signed)
Patient Name: Nicole Mercado  ?MRN: [id number]  ?Epilepsy Attending: Lora Havens  ?Referring Physician/Provider: Garvin Fila, MD ?Date: 05/17/2021 ?Duration: 24.26 mins ? ?Patient history: 81 year old patient with a history of aortic stenosis, A-fib on Eliquis, CAD, CLL, PAD, thrombocytopenia who presented with acute onset of severe aphasia and left-sided facial droop. EEG to evaluate for seizure. ? ?Level of alertness: Awake, drowsy ? ?AEDs during EEG study: None ? ?Technical aspects: This EEG study was done with scalp electrodes positioned according to the 10-20 International system of electrode placement. Electrical activity was acquired at a sampling rate of '500Hz'$  and reviewed with a high frequency filter of '70Hz'$  and a low frequency filter of '1Hz'$ . EEG data were recorded continuously and digitally stored.  ? ?Description: The posterior dominant rhythm consists of 8.5 Hz activity of moderate voltage (25-35 uV) seen predominantly in posterior head regions, symmetric and reactive to eye opening and eye closing. Drowsiness was characterized by attenuation of the posterior background rhythm. Hyperventilation and photic stimulation were not performed.    ? ?IMPRESSION: ?This study is within normal limits. No seizures or epileptiform discharges were seen throughout the recording. ? ?Lora Havens  ? ?

## 2021-05-17 NOTE — Progress Notes (Addendum)
? ?HD#0 ?SUBJECTIVE:  ?Patient Summary:  ?Nicole Mercado is an 81 year old female with a PMHx of atrial fibrillation on Eliquis, aortic stenosis s/p prosthetic valve, CAD s/p CABG, carotid artery disease s/p CEA, TIA in 2022, PAD, hyperlipidemia, CLL, and chronic thrombocytopenia. She presented to the emergency department as a code stroke for aphasia and L sided facial droop. Symptoms appear to have resolved.  ? ?Overnight Events:  ?NAEON ? ?Interim History:  ?Found lying comfortably in bed. No dysarthria or aphasia. Disoriented to location today, stating that she is in a restaurant. ? ?Later called after post-bowel movement syncopal episode. Patient back to her previous level of consciousness. Smiling and resting, in no acute acute distress.  ? ?OBJECTIVE:  ?Vital Signs: ?Vitals:  ? 05/17/21 0114 05/17/21 0329 05/17/21 0758 05/17/21 1229  ?BP: (!) 155/65 (!) 154/63    ?Pulse: 86 94    ?Resp: 20 17    ?Temp: 97.7 ?F (36.5 ?C) 98.4 ?F (36.9 ?C) 98.4 ?F (36.9 ?C) 98.4 ?F (36.9 ?C)  ?TempSrc: Oral Oral Oral Oral  ?SpO2: 98% 97%    ?Weight: 44.3 kg     ?Height: 5' (1.524 m)     ? ? ? ? ?Intake/Output Summary (Last 24 hours) at 05/17/2021 1244 ?Last data filed at 05/17/2021 0300 ?Gross per 24 hour  ?Intake 178.03 ml  ?Output --  ?Net 178.03 ml  ? ?Net IO Since Admission: 178.03 mL [05/17/21 1244] ? ?Physical Exam: ?Constitutional: Appears well-developed and well-nourished. No distress.  ?HENT: Normocephalic and atraumatic, EOMI, conjunctiva normal, moist mucous membranes ?Cardiovascular: Normal rate, regular rhythm, S1 and S2 present, no murmurs, rubs, gallops.  Distal pulses intact. ?Respiratory: Effort is normal on room air. CTAB. ?Abdominal: NTTP, +BS ?Musculoskeletal: Normal bulk and tone.  No peripheral edema noted. ?Skin: Warm and dry.  No rash, erythema, lesions noted. ?Neurological: Alert and oriented x4, no apparent focal deficits noted.  ?Psychiatric: Normal mood and affect. Behavior is normal.    ? ? ?ASSESSMENT/PLAN:  ?Assessment: ?Nicole Mercado is an 81 year old female with a PMHx of atrial fibrillation on Eliquis, aortic stenosis s/p prosthetic valve, CAD s/p CABG, carotid artery disease s/p CEA, TIA in 2022, PAD, hyperlipidemia, CLL, and chronic thrombocytopenia. She presented to the emergency department as a code stroke for aphasia and L sided facial droop. Symptoms appear to have resolved and MRI is negative. Likely TIA.  ? ? ?Plan: ? ?#TIA ?Initial NIHSS was 6 (scoring 2 for LOC questions, 1 for facial palsy, 1 for LLE weakness, 2 for language). CTH negative. CTA with no LVO. No TNK given out of window and on Eliquis. On my exam with IMTS several hours later, NIHSS of 0 and patient was without any signs of aphasia or dysarthria. MRI came back with no acute stroke. Presumed TIA, but will wait for stroke team assessment and recs.   ?-F/u stroke recs ?-Continue home Eliquis at reduced dose of 2.5 mg BID ?-Hold home ASA ?-Continue home Lipitor 10 mg ?-Permissive HTN x 48 hrs (BP <220/110), PRN Labetalol ?-q4 Neurochecks ?-PT/OT/SLP ?-Dys 3 diet (SLP recommending further eval) ?-Son requesting [facility] aide referral on D/C ?-Ambulatory stroke referral on d/c ? ?#Paroxysmal Atrial fibrillation ?Had a CV on 5/3 that was successful. Found to be in RVR on arrival. Given Cardizem 20 mg IV followed by gtt with rates controlled around 100-110. Transitioned to her home regimen 5/12 with HR in 70's-90's.  ?-Continue Cardizem CD 180 mg daily and Lopressor 100 mg BID ?-Telemetry ? ?#AKI  vs CKD 3a ?Cr of 1.25 on admit, previous values between 1 and 1.3. Currently at baseline. ?-Daily BMP ?-Avoid nephrotoxins  ?  ?#Chronic thrombocytopenia ?Plts at 128, appears to be bl.  ? ?FEN/GI: Dys 3 diet ?VTE: Eliquis ?Dispo: TBD ?Code Status: DNR ? ?Signature: ?Corky Sox, MD ?PGY-1 ?Pager: [phone] ?Please contact the on call pager after 5 pm and on weekends at [phone].  ? ?

## 2021-05-17 NOTE — TOC Initial Note (Addendum)
Transition of Care (TOC) - Initial/Assessment Note  ? ? ?Patient Details  ?Name: Nicole Mercado ?MRN: [id number] ?Date of Birth: February 08, 1940 ? ?Transition of Care (TOC) CM/SW Contact:    ?Pollie Friar, RN ?Phone Number: ?05/17/2021, 3:45 PM ? ?Clinical Narrative:                 ?Patient is from home alone. She has a friend, Nicole Mercado that checks on her daily.  ?No DME at home. She manages her own medications.  ?Nicole Mercado provides needed transportation.  ?Pt recently admitted to [facility] at the end of March.  ?TOC following for d/c needs.  ? ?Expected Discharge Plan: Home/Self Care ?Barriers to Discharge: Continued Medical Work up ? ? ?Patient Goals and CMS Choice ?  ?CMS Medicare.gov Compare Post Acute Care list provided to:: Patient ?Choice offered to / list presented to : Patient, Adult Children ? ?Expected Discharge Plan and Services ?Expected Discharge Plan: Home/Self Care ?  ?  ?  ?Living arrangements for the past 2 months: Apartment ?                ?  ?  ?  ?  ?  ?  ?  ?  ?  ?  ? ?Prior Living Arrangements/Services ?Living arrangements for the past 2 months: Apartment ?Lives with:: Self ?Patient language and need for interpreter reviewed:: Yes ?Do you feel safe going back to the place where you live?: Yes      ?  ?  ?  ?Criminal Activity/Legal Involvement Pertinent to Current Situation/Hospitalization: No - Comment as needed ? ?Activities of Daily Living ?  ?  ? ?Permission Sought/Granted ?  ?  ?   ?   ?   ?   ? ?Emotional Assessment ?Appearance:: Appears stated age ?Attitude/Demeanor/Rapport: Engaged ?Affect (typically observed): Accepting ?Orientation: : Oriented to Self, Oriented to Place ?  ?Psych Involvement: No (comment) ? ?Admission diagnosis:  Aphasia [R47.01] ?TIA (transient ischemic attack) [G45.9] ?Atrial fibrillation, unspecified type ([place]) [I48.91] ?Patient Active Problem List  ? Diagnosis Date Noted  ? Aphasia   ? Stroke-like episode   ? Hypokalemia 04/04/2021  ? Hypomagnesemia 04/04/2021  ?  Physical deconditioning 04/03/2021  ? Pleural effusion 04/03/2021  ? E-coli UTI 04/03/2021  ? TIA (transient ischemic attack) 03/30/2021  ? Elevated LFTs 03/30/2021  ? Hyponatremia 03/30/2021  ? Persistent atrial fibrillation with RVR ([place]) 03/19/2021  ? PAD (peripheral artery disease) ([place]) 10/19/2020  ? Stage 3a chronic kidney disease (CKD) ([place]) 03/25/2019  ? History of basal cell cancer 02/11/2019  ? PAC (premature atrial contraction) 01/14/2019  ? Goals of care, counseling/discussion 12/08/2018  ? Skin lesion 12/18/2016  ? Hypercalcemia 06/19/2016  ? Drug-induced neutropenia ([place]) 05/27/2016  ? Hematuria, gross 08/28/2015  ? Easy bruising 05/31/2015  ? Encounter for chemotherapy management 11/03/2014  ? Essential hypertension 10/30/2014  ? 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 S/P AVR 01/17/2014  ? CAD (coronary artery disease), native coronary artery 01/16/2014  ? Aortic stenosis 01/11/2014  ? Arrhythmia 01/11/2014  ? Syncope and collapse 01/11/2014  ? Faintness   ? Aftercare following surgery of the circulatory system, [place] 08/18/2013  ? Occlusion and stenosis of carotid artery without mention of cerebral infarction 06/03/2011  ? CLL (chronic lymphocytic leukemia) ([place]) 03/21/2011  ? Cardiac murmur 03/21/2011  ? Carotid artery stenosis 03/21/2011  ? Thrombocytopenia ([place]) 11/19/2010  ? Lymphadenopathy 11/11/2010  ? ?PCP:  Kristie Cowman, MD ?Pharmacy:   ?[facility] [address] ?[place] ?[place] [address] ?Phone: [phone] Fax: [phone] ? ? ? ? ?Social Determinants of Health (SDOH) Interventions ?  ? ?Readmission Risk Interventions ?   ? View : No data to display.  ?  ?  ?  ? ? ? ?

## 2021-05-17 NOTE — Progress Notes (Signed)
Friend listed as a contact has been notified about her event and will notify her sons.  ?

## 2021-05-17 NOTE — Progress Notes (Addendum)
STROKE TEAM PROGRESS NOTE  ? ?INTERVAL HISTORY ?Patient is seen in her room with no family at the bedside.  She was admitted with weakness, confusion, aphasia and left sided facial droop.  Symptoms have since resolved, and MRI shows no acute stroke.  EEG was also negative for seizure activity. ?Patient has A-fib and stated she was compliant with taking her Eliquis and did not miss any dosages.  She had a TEE on 04/04/2021 which had shown left atrial appendage clot.CT angiograms are pending ?Vitals:  ? 05/17/21 0114 05/17/21 0329 05/17/21 0758 05/17/21 1229  ?BP: (!) 155/65 (!) 154/63    ?Pulse: 86 94    ?Resp: 20 17    ?Temp: 97.7 ?F (36.5 ?C) 98.4 ?F (36.9 ?C) 98.4 ?F (36.9 ?C) 98.4 ?F (36.9 ?C)  ?TempSrc: Oral Oral Oral Oral  ?SpO2: 98% 97%    ?Weight: 44.3 kg     ?Height: 5' (1.524 m)     ? ?CBC:  ?Recent Labs  ?Lab 05/16/21 ?6295 05/16/21 ?2841 05/17/21 ?3244  ?WBC 7.7  --  6.4  ?NEUTROABS 4.3  --   --   ?HGB 14.3 13.6 14.1  ?HCT 42.1 40.0 41.3  ?MCV 95.7  --  92.8  ?PLT 128*  --  143*  ? ?Basic Metabolic Panel:  ?Recent Labs  ?Lab 05/16/21 ?0102 05/16/21 ?7253 05/17/21 ?6644  ?NA 137 136 138  ?K 3.8 3.7 3.2*  ?CL 103 104 102  ?CO2 22  --  24  ?GLUCOSE 163* 137* 174*  ?BUN '15 16 13  '$ ?CREATININE 1.25* 1.10* 1.21*  ?CALCIUM 10.1  --  10.1  ? ?Lipid Panel:  ?Recent Labs  ?Lab 05/17/21 ?0918  ?CHOL 131  ?TRIG 122  ?HDL 44  ?CHOLHDL 3.0  ?VLDL 24  ?[place] 63  ? ?HgbA1c:  ?Recent Labs  ?Lab 05/17/21 ?0918  ?HGBA1C 5.0  ? ?Urine Drug Screen: No results for input(s): LABOPIA, COCAINSCRNUR, LABBENZ, AMPHETMU, THCU, LABBARB in the last 168 hours.  ?Alcohol Level No results for input(s): ETH in the last 168 hours. ? ?IMAGING past 24 hours ?MR BRAIN WO CONTRAST ? ?Result Date: 05/17/2021 ?CLINICAL DATA:  Initial evaluation for neuro deficit, stroke suspected. EXAM: MRI HEAD WITHOUT CONTRAST TECHNIQUE: Multiplanar, multiecho pulse sequences of the brain and surrounding structures were obtained without intravenous contrast.  COMPARISON:  Prior CT from 05/16/2021. FINDINGS: Brain: Examination moderately degraded by motion. Generalized age-related cerebral atrophy. Patchy and confluent T2/FLAIR hyperintensity involving the periventricular and deep white matter, most consistent with chronic small vessel ischemic disease, mild to moderate in nature. Few small remote right cerebellar infarcts noted. No abnormal foci of restricted diffusion to suggest acute or subacute ischemia. Gray-white matter differentiation maintained. No areas of chronic cortical infarction. No visible acute or chronic intracranial blood products. No mass lesion, midline shift or mass effect. No hydrocephalus or extra-axial fluid collection. Pituitary gland and suprasellar region grossly normal. Midline structures intact and normally formed. Vascular: Major intracranial vascular flow voids are maintained. Skull and upper cervical spine: Craniocervical junction within normal limits. Bone marrow signal intensity normal. No scalp soft tissue abnormality. Sinuses/Orbits: Prior bilateral ocular lens replacement. Paranasal sinuses are clear. No significant mastoid effusion. Other: None. IMPRESSION: 1. No acute intracranial abnormality. 2. Age-related cerebral atrophy with mild to moderate chronic small vessel ischemic disease. Few small remote right cerebellar infarcts. Electronically Signed   By: Jeannine Boga M.D.   On: 05/17/2021 01:52  ? ?EEG adult ? ?Result Date: 05/17/2021 ?Lora Havens, MD  05/17/2021 12:55 PM Patient Name: Nicole Mercado MRN: [id number] Epilepsy Attending: Lora Havens Referring Physician/Provider: Garvin Fila, MD Date: 05/17/2021 Duration: 24.26 mins Patient history: 81 year old patient with a history of aortic stenosis, A-fib on Eliquis, CAD, CLL, PAD, thrombocytopenia who presented with acute onset of severe aphasia and left-sided facial droop. EEG to evaluate for seizure. Level of alertness: Awake, drowsy AEDs during EEG study:  None Technical aspects: This EEG study was done with scalp electrodes positioned according to the 10-20 International system of electrode placement. Electrical activity was acquired at a sampling rate of '500Hz'$  and reviewed with a high frequency filter of '70Hz'$  and a low frequency filter of '1Hz'$ . EEG data were recorded continuously and digitally stored. Description: The posterior dominant rhythm consists of 8.5 Hz activity of moderate voltage (25-35 uV) seen predominantly in posterior head regions, symmetric and reactive to eye opening and eye closing. Drowsiness was characterized by attenuation of the posterior background rhythm. Hyperventilation and photic stimulation were not performed.   IMPRESSION: This study is within normal limits. No seizures or epileptiform discharges were seen throughout the recording. Priyanka Barbra Sarks   ? ?PHYSICAL EXAM ?General:  Alert, thin-appearing elderly patient with bruising and swelling to left foot ?Respiratory:  Regular, unlabored respirations on room air ? ?NEURO:  ?Mental Status: AA&Ox3 3/3 registration, 1/3 recall, able to name 7 animals with 4 feet. Unable to add change together and cannot recall current president ?Speech/Language: speech is without dysarthria or aphasia.  Naming, repetition, fluency, and comprehension intact. ? ?Cranial Nerves:  ?II: PERRL. Visual fields full.  ?III, IV, VI: EOMI. Eyelids elevate symmetrically.  ?V: Sensation is intact to light touch and symmetrical to face.  ?VII: Smile is symmetrical.  ?VIII: hearing intact to voice. ?IX, X: Phonation is normal.  ?XII: tongue is midline without fasciculations. ?Motor: 5/5 strength to all muscle groups tested.  ?Tone: is normal and bulk is normal ?Sensation- Intact to light touch bilaterally.  ?Coordination: FTN intact bilaterally, HKS: no ataxia in BLE.No drift.  ?Gait- deferred ? ? ?ASSESSMENT/PLAN ?[name] is a 81 y.o. female with history of aortic stenosis, arthritis, atrial fibrillation on  anticoagulation, CAD, carotid artery occlusion, CLL, HTN, PAD and pancreatitis presenting with weakness, confusion, aphasia and left sided facial droop.  Symptoms have since resolved, and MRI shows no acute stroke.  EEG was also negative for seizure activity. ? 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 Small vessel disease. Atrophy. ASPECTS 10.    ?CTA head & neck No emergent LVO, left V2 and left P2 PCA moderate stenosis, 40% stenosis of left common carotid ?CT perfusion 33 mL penumbra, no core ?MRI  no acute abnormality, age-related atrophy and chronic small vessel ischemic disease ?TEE 3/30 prosthetic aortic valve present, EF 55-60%, mild to moderate mitral regurgitation, LAA thrombus detected, no atrial level shunt ?LDL 63 ?HgbA1c 5.0 ?VTE prophylaxis - SCDs ?   ?Diet  ? DIET DYS 3 Room service appropriate? Yes; Fluid consistency: Thin  ? ?aspirin 81 mg daily and Eliquis (apixaban) daily prior to admission, now on Eliquis (apixaban) daily.  ?Therapy recommendations:  pending ?Disposition:  pending ? ?Hypertension ?Home meds:  none ?Stable ?Keep BP <180/105 ?Long-term BP goal normotensive ? ?Hyperlipidemia ?Home meds:  atorvastatin 10 mg daily, resumed in hospital ?LDL 63, goal < 70 ?High intensity  statin not indicated as LDL below goal ?Continue statin at discharge ? ?Atrial fibrillation ?Patient has a history of atrial fibrillation and takes Eliquis at home ?Continue home Eliquis at 2.5 mg BID doe to age >88 and weight <60 kg ? ?Other Stroke Risk Factors ?Advanced Age >/= 36  ? ?Other Active Problems ?none ? ?Hospital day # 0 ? ?[place] , MSN, AGACNP-BC ?[facility] ?See Amion for schedule and pager information ?05/17/2021 1:41 PM ?  ? ? ?STROKE MD NOTE :  ?I have personally obtained history,examined this  patient, reviewed notes, independently viewed imaging studies, participated in medical decision making and plan of care.ROS completed by me personally and pertinent positives fully documented  I have made a

## 2021-05-17 NOTE — Progress Notes (Addendum)
?   05/17/21 1835  ?What Happened  ?Was fall witnessed? No  ?Was patient injured? Yes  ?Patient found on floor  ?Found by Staff-comment ?(Theodoro Grist)  ?Stated prior activity other (comment)  ?Follow Up  ?MD notified Dr. Heber [place]  ?Time MD notified 1845  ?Additional tests Yes-comment  ?Simple treatment Other (comment) ?(peri care and assessment)  ?Adult Fall Risk Assessment  ?Risk Factor Category (scoring not indicated) High fall risk per protocol (document High fall risk)  ?Patient Fall Risk Level High fall risk  ?Adult Fall Risk Interventions  ?Required Bundle Interventions *See Row Information* High fall risk - low, moderate, and high requirements implemented  ?Additional Interventions Use of appropriate toileting equipment (bedpan, BSC, etc.);Room near nurses station;Reorient/diversional activities with confused patients;PT/OT need assessed if change in mobility from baseline  ?Screening for Fall Injury Risk (To be completed on HIGH fall risk patients) - Assessing Need for Floor Mats  ?Risk For Fall Injury- Criteria for Floor Mats Bleeding risk-anticoagulation (not prophylaxis)  ?Will Implement Floor Mats Yes  ?Pain Assessment  ?Pain Scale 0-10  ?Pain Score 0  ?Neurological  ?Neuro (WDL) X  ?Level of Consciousness Alert  ?Orientation Level Oriented to person  ?Cognition Impulsive;Poor attention/concentration;Poor judgement;Poor safety awareness;Memory impairment  ?Speech Clear  ?R Pupil Size (mm) 2  ?R Pupil Shape Round  ?R Pupil Reaction Brisk  ?L Pupil Size (mm) 2  ?L Pupil Shape Round  ?L Pupil Reaction Brisk  ?Facial Symmetry Symmetrical  ?RUE Motor Response Purposeful movement  ?RUE Sensation Full sensation  ?RUE Motor Strength 4  ?LUE Motor Response Purposeful movement  ?LUE Sensation Full sensation  ?LUE Motor Strength 4  ?RLE Motor Response Purposeful movement  ?RLE Sensation Full sensation  ?RLE Motor Strength 4  ?LLE Motor Response Purposeful movement  ?LLE Sensation Full sensation  ?LLE Motor  Strength 4  ?Glasgow Coma Scale  ?Eye Opening 4  ?Best Verbal Response (NON-intubated) 4  ?Best Motor Response 6  ?Glasgow Coma Scale Score 14  ?Musculoskeletal  ?Musculoskeletal (WDL) X  ?Assistive Device None  ?Generalized Weakness Yes  ?Weight Bearing Restrictions No  ?Musculoskeletal Details  ?LLE Limited movement;Swelling;Injury/trauma  ?Integumentary  ?Integumentary (WDL) X  ?Skin Color Appropriate for ethnicity  ?Skin Condition Dry  ?Skin Integrity Ecchymosis;Abrasion;Hematoma  ?Abrasion Location Arm  ?Abrasion Location Orientation Left  ?Ecchymosis Location Arm;Back;Foot  ?Ecchymosis Location Orientation Left;Lower  ?Location of Hematoma left foot and back of head  ?RN left room around 15 minutes prior. Provided peri care (pt had stool and urine.) Changed gown, bed pad and blankets. Adjusted all blankets, provided fresh drink, clean purwick and education on how it works as well as education on the call bell.  ? ?Bed alarm went off. Patient was told, "don't get up" repeatedly while Rn and Network engineer ran to room. Patient was found on floor. Patient was cleaned up, vitals taken, and assessed. Small knot was found on back of head. Charge and AD helped complete fall protocol.  MD was notified. Family was notified. ?

## 2021-05-17 NOTE — Significant Event (Signed)
AD called to the room. Staff responded immediately to her bed alarm and patient was found on the floor. Assessed pt: Back of head is red. Also, with a knot to the left side of head and bleeding. Another BM as well. I.M. coming to bedside. ?

## 2021-05-17 NOTE — Evaluation (Signed)
Clinical/Bedside Swallow Evaluation Patient Details  Name: PAIZLEE BELLINGHAUSEN MRN: [id number] Date of Birth: 12-Nov-1940  Today's Date: 05/17/2021 Time: SLP Start Time (ACUTE ONLY): 0800 SLP Stop Time (ACUTE ONLY): 0823 SLP Time Calculation (min) (ACUTE ONLY): 23 min  Past Medical History:  Past Medical History:  Diagnosis Date   Aortic stenosis    a. Echocardiogram (01/11/14):  Mild LVH, EF 60-65%, Gr 1 DD, possible bicuspid AV, severe AS (mean 61 mmHg, peak 104 mmHg), mild MVP of post leaflet, mild MR, PASP 33 mmHg.;  b. 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 LHC (01/13/14):  dLM 70 extending into oLAD, mLAD 40-50, pD1 80, pD2 70, ostial/prox OM1 70 >> CABG (L-LAD, S-OM1, S-D1, S-D2)  // Myoview 10/22: Fixed defect anterolateral wall consistent with artifact, EF 71, no ischemia, low risk   Carotid artery occlusion    a. s/p L CEA 2013;  b.  Carotid US (1/16):  Bilateral ICA 1-39% // Carotid US 10/22: Bilateral ICA 1-39   CLL (chronic lymphocytic leukemia) (HCC)    slow leukemia---Dr  Murinson   H/O exercise stress test    NSSTT   Hx of cardiovascular stress test    a. Nuclear stress test That (4/13): Normal perfusion, EF 74%   Hx of echocardiogram    Echo (2/16):  Mild LVH, EF 60-65%, no RWMA, Gr 2 DD, AVR ok (mean 9 mmHg), trivial AI, mild MR, mild LAE, PASP 40 mmHg   Hypertension    PAD (peripheral artery disease) (HCC)    LE Arterial US 10/22: R - pCFA, oSFA, dSFA 30-49; L - pCFA + mSFA 50-74, pPop 30-49 >> referred to [facility]   Pancreatitis    h/o   Thrombocytopenia (HCC) 11/19/2010   Past Surgical History:  Past Surgical History:  Procedure Laterality Date   ABDOMINAL HYSTERECTOMY     AORTIC VALVE REPLACEMENT [name] 01/17/2014   Procedure: AORTIC VALVE REPLACEMENT (AVR);  Surgeon: Alleen Borne, MD;  Location: [facility] OR;  Service: Open Heart Surgery;  Laterality: [name];   APPENDECTOMY      CARDIOVERSION [name] 04/04/2021   Procedure: CARDIOVERSION;  Surgeon: Thomasene Ripple, DO;  Location: [facility] ENDOSCOPY;  Service: Cardiovascular;  Laterality: [name];   CARDIOVERSION [name] 05/08/2021   Procedure: CARDIOVERSION;  Surgeon: Thomasene Ripple, DO;  Location: [facility] ENDOSCOPY;  Service: Cardiovascular;  Laterality: [name];   CAROTID ENDARTERECTOMY  06/09/11   LEFT  cea   CESAREAN SECTION     FIVE   CHOLECYSTECTOMY     CORONARY ARTERY BYPASS GRAFT [name] 01/17/2014   Procedure: CORONARY ARTERY BYPASS GRAFTING (CABG)TIMES FOUR USING LEFT INTERNAL MAMMARY ARTERY AND RIGHT SAPHENOUS VEIN HARVESTED ENDOSCOPICALLY;  Surgeon: Alleen Borne, MD;  Location: [facility] OR;  Service: Open Heart Surgery;  Laterality: [name];   ENDARTERECTOMY  06/09/2011   Procedure: ENDARTERECTOMY CAROTID;  Surgeon: Larina Earthly, MD;  Location: [facility] OR;  Service: Vascular;  Laterality: Left;  left carotid endarterectomy with patch angioplasty   LEFT AND RIGHT HEART CATHETERIZATION WITH CORONARY ANGIOGRAM [name] 01/13/2014   Procedure: LEFT AND RIGHT HEART CATHETERIZATION WITH CORONARY ANGIOGRAM;  Surgeon: Corky Crafts, MD;  [facility] CATH LAB;  Service: Cardiovascular;  Laterality: [name];   TEE WITHOUT CARDIOVERSION [name] 01/17/2014   Procedure: TRANSESOPHAGEAL ECHOCARDIOGRAM (TEE);  Surgeon: Alleen Borne, MD;  Location: [facility] OR;  Service: Open Heart Surgery;  Laterality:  [name];   TEE WITHOUT CARDIOVERSION [name] 04/04/2021   Procedure: TRANSESOPHAGEAL ECHOCARDIOGRAM (TEE);  Surgeon: Thomasene Ripple, DO;  Location: [facility] ENDOSCOPY;  Service: Cardiovascular;  Laterality: [name];   TONSILLECTOMY     HPI:  This is an 81 year old patient with a history of aortic stenosis, A-fib on Eliquis, CAD, CLL, PAD, thrombocytopenia who presented with acute onset of severe aphasia and left-sided facial droop. MRI shows No acute intracranial abnormality. Pt was admitted on 03/30/21 for a similar presentation. SLP cognitive evaluation at that time reported "She had an odd affect and had  delays in responses even to basic questions. She participated in completing the [facility] Mental Status exam) and received a score of 13 out of possible 30".    Assessment / Plan / Recommendation  Clinical Impression  Pt alert, speech and language clear, pt only oriented to self. Unable to provide any accurate history. Friend at bedside has already given her a biscuit despite being NPO, but she tolerated that well. Friend states pt typically has a good appetite and eats well, but has seemed to complain of food sticking over the last week. He worries she may have dry mouth. Suspect pt is unsupervised much of the day and there is no ability to judge how well she is eating and drinking. Pt today observed to have adequate mastication, but slightly prolonged oral transit, assisted by liquid wash which did elicit a cough. Otherwise pt tolerated sips of water without difficulty. Pt is safe to initiate a diet, but further observation with a meal may be needed to determine is there is an acute difficulty swallowing. Introduced some compensatory strategies but seems unlikely that pt will have the supervision to carry these out. Pt would benefit from increased supervision at home and possibly assistance with meals. Cognition is consistent with prior assessment, but her friend feels that she is not at her baseline. SLP will f/u at mealtime.  SLP Visit Diagnosis: Dysphagia, unspecified (R13.10)    Aspiration Risk  Mild aspiration risk;Risk for inadequate nutrition/hydration    Diet Recommendation Dysphagia 3 (Mech soft);Thin liquid   Liquid Administration via: Cup;Straw Medication Administration: Whole meds with puree Supervision: Patient able to self feed Compensations: Slow rate;Small sips/bites Postural Changes: Seated upright at 90 degrees    Other  Recommendations      Recommendations for follow up therapy are one component of a multi-disciplinary discharge planning process, led by the  attending physician.  Recommendations may be updated based on patient status, additional functional criteria and insurance authorization.  Follow up Recommendations Home health SLP      Assistance Recommended at Discharge Frequent or constant Supervision/Assistance  Functional Status Assessment Patient has had a recent decline in their functional status and demonstrates the ability to make significant improvements in function in a reasonable and predictable amount of time.  Frequency and Duration min 1 x/week          Prognosis Prognosis for Safe Diet Advancement: Good      Swallow Study   General HPI: This is an 81 year old patient with a history of aortic stenosis, A-fib on Eliquis, CAD, CLL, PAD, thrombocytopenia who presented with acute onset of severe aphasia and left-sided facial droop. MRI shows No acute intracranial abnormality. Pt was admitted on 03/30/21 for a similar presentation. SLP cognitive evaluation at that time reported "She had an odd affect and had delays in responses even to basic questions. She participated in completing the [facility] [employer] Mental Status exam) and  received a score of 13 out of possible 30". Type of Study: Bedside Swallow Evaluation Previous Swallow Assessment: see HPI Diet Prior to this Study: NPO Temperature Spikes Noted: No Respiratory Status: Room air History of Recent Intubation: No Behavior/Cognition: Alert;Cooperative;Pleasant mood Oral Cavity Assessment: Within Functional Limits Oral Care Completed by SLP: No Oral Cavity - Dentition: Adequate natural dentition Vision: Functional for self-feeding Self-Feeding Abilities: Able to feed self Patient Positioning: Upright in bed Baseline Vocal Quality: Normal Volitional Cough: Strong Volitional Swallow: Able to elicit    Oral/Motor/Sensory Function Overall Oral Motor/Sensory Function: Within functional limits   Ice Chips     Thin Liquid Thin Liquid: Impaired Pharyngeal  Phase  Impairments: Cough - Immediate    Nectar Thick Nectar Thick Liquid: Not tested   Honey Thick Honey Thick Liquid: Not tested   Puree Puree: Within functional limits   Solid     Solid: Impaired Presentation: Self Fed Oral Phase Impairments: Impaired mastication Oral Phase Functional Implications: Prolonged oral transit      Victorious Kundinger, Riley Nearing 05/17/2021,8:35 AM

## 2021-05-17 NOTE — Evaluation (Signed)
Occupational Therapy Evaluation Patient Details Name: Nicole Mercado MRN: [id number] DOB: [birth year] Today's Date: 05/17/2021   History of Present Illness 81 y.o. female was brought to hosp 5/11 demonstrating L side weakness and facial droop, confusion, aphasia and had negative MRI.  EEG done earlier, no findings.  Had a TIA type event during this PT eval and slumped over in BR from standing, gradual loss of alertness and speech.  Nursing assessed, pt became fully responsive with no drops in BP, sats or HR issues.  Pt is poor historian, slow to follow directions.  PMH: CAD status post CABG and bioprosthetic aortic valve replacement, history of CLL, A-fib with RVR, syncopal episode.   Clinical Impression   Pt admitted for concerns listed above. PTA pt reported that she "thinks she was independent with all ADL's", which appears correct per chart review. At this time, pt presents with increased confusion/cognitive concerns, weakness, and balance deficits. She is requiring up to min A for BADL's and functional mobility. Pt also requiring up to max cuing due to poor recall and safety awareness. Recommending SNF at this time, to improve pt independence and safety. OT will follow acutely.        Recommendations for follow up therapy are one component of a multi-disciplinary discharge planning process, led by the attending physician.  Recommendations may be updated based on patient status, additional functional criteria and insurance authorization.   Follow Up Recommendations  Skilled nursing-short term rehab (<3 hours/day)    Assistance Recommended at Discharge Frequent or constant Supervision/Assistance  Patient can return home with the following A little help with walking and/or transfers;A little help with bathing/dressing/bathroom;Assistance with cooking/housework;Assist for transportation;Help with stairs or ramp for entrance;Direct supervision/assist for medications management;Direct  supervision/assist for financial management    Functional Status Assessment  Patient has had a recent decline in their functional status and demonstrates the ability to make significant improvements in function in a reasonable and predictable amount of time.  Equipment Recommendations  Other (comment) (RW)    Recommendations for Other Services       Precautions / Restrictions Precautions Precautions: Fall Precaution Comments: hx of falls with L side weakness Restrictions Weight Bearing Restrictions: No      Mobility Bed Mobility Overal bed mobility: Needs Assistance Bed Mobility: Supine to Sit, Sit to Supine     Supine to sit: Min guard Sit to supine: Min guard        Transfers Overall transfer level: Needs assistance Equipment used: 1 person hand held assist Transfers: Sit to/from Stand Sit to Stand: Min assist           General transfer comment: min assist to power up      Balance Overall balance assessment: Needs assistance, History of Falls Sitting-balance support: Feet supported, Single extremity supported Sitting balance-Leahy Scale: Fair Sitting balance - Comments: requires close guard as at times she lets go and falls backward Postural control: Posterior lean Standing balance support: Bilateral upper extremity supported Standing balance-Leahy Scale: Poor Standing balance comment: some assist throughout with standing                           ADL either performed or assessed with clinical judgement   ADL Overall ADL's : Needs assistance/impaired Eating/Feeding: Independent;Sitting   Grooming: Min guard;Standing   Upper Body Bathing: Supervision/ safety;Sitting   Lower Body Bathing: Min guard;Sitting/lateral leans;Sit to/from stand   Upper Body Dressing : Supervision/safety;Sitting   Lower Body  Dressing: Min guard;Sitting/lateral leans;Sit to/from stand   Toilet Transfer: Minimal assistance;Ambulation   Toileting- Clothing  Manipulation and Hygiene: Min guard;Sitting/lateral lean       Functional mobility during ADLs: Minimal assistance;Rolling walker (2 wheels) General ADL Comments: Requiring assist due to weakness and cognition     Vision Baseline Vision/History: 1 Wears glasses Ability to See in Adequate Light: 0 Adequate Patient Visual Report: No change from baseline Vision Assessment?: No apparent visual deficits     Perception     Praxis      Pertinent Vitals/Pain Pain Assessment Pain Assessment: No/denies pain     Hand Dominance Right   Extremity/Trunk Assessment Upper Extremity Assessment Upper Extremity Assessment: Overall WFL for tasks assessed   Lower Extremity Assessment Lower Extremity Assessment: Defer to PT evaluation   Cervical / Trunk Assessment Cervical / Trunk Assessment: Kyphotic (mild)   Communication Communication Communication: No difficulties   Cognition Arousal/Alertness: Awake/alert Behavior During Therapy: WFL for tasks assessed/performed Overall Cognitive Status: Impaired/Different from baseline Area of Impairment: Orientation, Attention, Memory, Following commands, Safety/judgement, Awareness, Problem solving                 Orientation Level: Disoriented to, Place, Time, Situation Current Attention Level: Selective Memory: Decreased recall of precautions, Decreased short-term memory Following Commands: Follows one step commands with increased time, Follows one step commands inconsistently Safety/Judgement: Decreased awareness of deficits, Decreased awareness of safety Awareness: Intellectual, Emergent Problem Solving: Slow processing, Decreased initiation, Difficulty sequencing, Requires verbal cues, Requires tactile cues General Comments: pt is slow to retain verbal instructions, has poor recall to situation or home or prior functions     General Comments  VSS on RA    Exercises     Shoulder Instructions      Home Living Family/patient  expects to be discharged to:: Private residence Living Arrangements: Alone Available Help at Discharge: Friend(s);Available PRN/intermittently Type of Home: House Home Access: Level entry     Home Layout: Two level     Bathroom Shower/Tub: Chief Strategy Officer: Standard Bathroom Accessibility: Yes   Home Equipment: None   Additional Comments: "very close friend" able to assist "some"  Lives With: Alone    Prior Functioning/Environment Prior Level of Function : Independent/Modified Independent             Mobility Comments: Pt was unable to confirm if she was still walking with independence at home ADLs Comments: Pt reports that she "thinks thatshe was independent"        OT Problem List: Decreased strength;Decreased range of motion;Decreased activity tolerance;Impaired balance (sitting and/or standing);Decreased cognition;Decreased safety awareness      OT Treatment/Interventions: Self-care/ADL training;Therapeutic exercise;DME and/or AE instruction;Therapeutic activities;Cognitive remediation/compensation    OT Goals(Current goals can be found in the care plan section) Acute Rehab OT Goals Patient Stated Goal: none stated OT Goal Formulation: With patient Time For Goal Achievement: 05/31/21 Potential to Achieve Goals: Good ADL Goals Pt Will Perform Lower Body Bathing: with modified independence;sitting/lateral leans;sit to/from stand Pt Will Perform Lower Body Dressing: with modified independence;sitting/lateral leans;sit to/from stand Pt Will Transfer to Toilet: with modified independence;ambulating Pt Will Perform Toileting - Clothing Manipulation and hygiene: with modified independence;sitting/lateral leans;sit to/from stand Additional ADL Goal #1: Pt will complete 3 step directional task to improve safety during ADL's.  OT Frequency: Min 2X/week    Co-evaluation              AM-PAC OT "6 Clicks" Daily Activity  Outcome Measure Help  from another person eating meals?: None Help from another person taking care of personal grooming?: A Little Help from another person toileting, which includes using toliet, bedpan, or urinal?: A Little Help from another person bathing (including washing, rinsing, drying)?: A Little Help from another person to put on and taking off regular upper body clothing?: A Little Help from another person to put on and taking off regular lower body clothing?: A Little 6 Click Score: 19   End of Session Equipment Utilized During Treatment: Gait belt;Rolling walker (2 wheels) Nurse Communication: Mobility status  Activity Tolerance: Patient tolerated treatment well Patient left: in bed;with call bell/phone within reach;with bed alarm set  OT Visit Diagnosis: Unsteadiness on feet (R26.81);Other abnormalities of gait and mobility (R26.89);Muscle weakness (generalized) (M62.81);Other symptoms and signs involving cognitive function                Time: 1649-1710 OT Time Calculation (min): 21 min Charges:  OT General Charges $OT Visit: 1 Visit OT Evaluation $OT Eval Moderate Complexity: 1 Mod  Lacey Wallman H., OTR/L Acute Rehabilitation  Charly Holcomb Elane Bing Plume 05/17/2021, 7:01 PM

## 2021-05-17 NOTE — Significant Event (Addendum)
Rapid Response Event Note  ? ?Reason for Call :  ?Loss of consciousness ? ?Initial Focused Assessment:  ?Pt lying in bed. Appears drowsy, but quickly becomes alert. Oriented to person. Disoriented to place, time, and situation. Pt skin is warm, pink, dry. She does not appear to be in distress.  ? ?She is unable to recall the events leading up to and following her loss of consciousness while up in the bathroom with PT. ? ?Bilateral grips and plantar flexion are equal. EOMI. Left facial droop. Speech clear. Aphasia.  ? ?VS: T 98.36F, BP 137/86, HR 96, RR 16, SpO2 99% on room air ?CBG: 126 ? ?Interventions:  ?-No intervention from RRT ? ?Plan of Care:  ?-Likely vagal event in the setting of being up to the bathroom having a bowel movement at event onset ?-OOB with stand-by assist- once cleared by MD ?-Fall precautions ? ?Call rapid response for additional needs ? ?Event Summary:  ?MD Notified: per RN ?Call Time: 1335 ?Arrival Time: 1338 ?End Time:1350 ? ?Casimer Bilis, RN ?

## 2021-05-17 NOTE — Progress Notes (Signed)
Physical Therapy Evaluation ?Patient Details ?Name: Nicole Mercado ?MRN: [id number] ?DOB: 01-02-1941 ?Today's Date: 05/17/2021 ? ?History of Present Illness ? 81 y.o. female was brought to hosp 5/11 demonstrating L side weakness and facial droop, confusion, aphasia and had negative MRI.  EEG done earlier, no findings.  Had a TIA type event during this PT eval and slumped over in BR from standing, gradual loss of alertness and speech.  Nursing assessed, pt became fully responsive with no drops in BP, sats or HR issues.  Pt is poor historian, slow to follow directions.  PMH: CAD status post CABG and bioprosthetic aortic valve replacement, history of CLL, A-fib with RVR, syncopal episode.  ?Clinical Impression ? Pt was seen for progression of mobility with sitting assist to stand and then walk to BR, holding PT hand.  Pt became unresponsive after standing to get cleaned up. Rapid response was called and with no resuscitation, did return to talking and responding to questions after a short time.  Pt was assisted total assist to bed, but is able to move afterward and hopefully will have no further events now. CT scheduled for this PM.   ?Her plan is to work on recovery of balance and strength with consideration for her medical tolerance for movement.  BP in BR was 137/ 86, pulses in 80's and sats 98%.  Follow acutely for goals of PT.  ?   ? ?Recommendations for follow up therapy are one component of a multi-disciplinary discharge planning process, led by the attending physician.  Recommendations may be updated based on patient status, additional functional criteria and insurance authorization. ? ?Follow Up Recommendations Skilled nursing-short term rehab (<3 hours/day) ? ?  ?Assistance Recommended at Discharge Frequent or constant Supervision/Assistance  ?Patient can return home with the following ? A little help with walking and/or transfers;A little help with bathing/dressing/bathroom;Assistance with  cooking/housework;Direct supervision/assist for medications management;Direct supervision/assist for financial management;Assist for transportation;Help with stairs or ramp for entrance ? ?  ?Equipment Recommendations None recommended by PT  ?Recommendations for Other Services ?    ?  ?Functional Status Assessment Patient has had a recent decline in their functional status and demonstrates the ability to make significant improvements in function in a reasonable and predictable amount of time.  ? ?  ?Precautions / Restrictions Precautions ?Precautions: Fall ?Precaution Comments: hx of falls with L side weakness ?Restrictions ?Weight Bearing Restrictions: No  ? ?  ? ?Mobility ? Bed Mobility ?Overal bed mobility: Needs Assistance ?Bed Mobility: Supine to Sit, Sit to Supine ?  ?  ?Supine to sit: Min guard ?Sit to supine: Total assist ?  ?General bed mobility comments: total assist as pt had episode that looked like TIA in BR ?  ? ?Transfers ?Overall transfer level: Needs assistance ?Equipment used: 1 person hand held assist ?Transfers: Sit to/from Stand ?Sit to Stand: Min assist ?  ?  ?  ?  ?  ?General transfer comment: min assist to power up ?  ? ?Ambulation/Gait ?Ambulation/Gait assistance: Min guard, Min assist ?Gait Distance (Feet): 25 Feet ?Assistive device: 1 person hand held assist ?Gait Pattern/deviations: Step-through pattern, Decreased stride length, Wide base of support, Trunk flexed ?Gait velocity: reduced ?Gait velocity interpretation:  (variable) ?Pre-gait activities: standing balance ck ?General Gait Details: Pt walked with no AD but with min assist to control balance, esp as pt was a bit confused and required a lot of direction for safety and balance correction.  Pt is laterally unstable esp to L side and  does not react well to recapture it ? ?Stairs ?  ?  ?  ?  ?  ? ?Wheelchair Mobility ?  ? ?Modified Rankin (Stroke Patients Only) ?  ? ?  ? ?Balance Overall balance assessment: Needs assistance, History  of Falls ?Sitting-balance support: Feet supported, Single extremity supported ?Sitting balance-Leahy Scale: Fair ?Sitting balance - Comments: requires close guard as at times she lets go and falls backward ?Postural control: Posterior lean ?Standing balance support: Bilateral upper extremity supported ?Standing balance-Leahy Scale: Poor ?Standing balance comment: pt is falling to L side as she walks to BR, noted her inability to recapture and requires both assistance and vc's to maintain safety with standing and walking ?  ?  ?  ?  ?  ?  ?  ?  ?  ?  ?  ?   ? ? ? ?Pertinent Vitals/Pain Pain Assessment ?Pain Assessment: No/denies pain  ? ? ?Home Living Family/patient expects to be discharged to:: Unsure ?Living Arrangements: Alone ?Available Help at Discharge: Friend(s);Available PRN/intermittently ?Type of Home: House ?Home Access: Level entry ?  ?  ?  ?Home Layout: One level ?Home Equipment: None ?Additional Comments: "very close friend" able to assist "some"  ?  ?Prior Function Prior Level of Function : Independent/Modified Independent ?  ?  ?  ?  ?  ?  ?Mobility Comments: Pt was unable to confirm if she was still walking with independence at home ?  ?  ? ? ?Hand Dominance  ? Dominant Hand: Right ? ?  ?Extremity/Trunk Assessment  ? Upper Extremity Assessment ?Upper Extremity Assessment: Generalized weakness ?  ? ?Lower Extremity Assessment ?Lower Extremity Assessment: Generalized weakness ?  ? ?Cervical / Trunk Assessment ?Cervical / Trunk Assessment: Kyphotic (mild)  ?Communication  ? Communication: No difficulties  ?Cognition Arousal/Alertness: Lethargic, Awake/alert ?Behavior During Therapy: Flat affect ?Overall Cognitive Status: Impaired/Different from baseline ?Area of Impairment: Orientation, Attention, Memory, Following commands, Safety/judgement, Awareness, Problem solving ?  ?  ?  ?  ?  ?  ?  ?  ?  ?Current Attention Level: Selective ?Memory: Decreased recall of precautions, Decreased short-term  memory ?Following Commands: Follows one step commands with increased time, Follows one step commands inconsistently ?Safety/Judgement: Decreased awareness of deficits, Decreased awareness of safety ?Awareness: Intellectual, Emergent ?Problem Solving: Slow processing, Decreased initiation, Difficulty sequencing, Requires verbal cues, Requires tactile cues ?General Comments: pt is slow to retain verbal instructions ?  ?  ? ?  ?General Comments General comments (skin integrity, edema, etc.): Pt was assisted to BR, where she sat with fair balance to clean up, and at the end was assisted to stand and get cleaned up.  Required min mod assist to stand but then too fatigued apparently, sat again and gradually began to be less responsive.  LUE dropped to side of commode, called nursing in to assess and then rapid response ? ?  ?Exercises    ? ?Assessment/Plan  ?  ?PT Assessment Patient needs continued PT services  ?PT Problem List Decreased strength;Decreased range of motion;Decreased activity tolerance;Decreased balance;Decreased mobility;Decreased coordination;Decreased cognition;Decreased knowledge of use of DME;Decreased safety awareness;Cardiopulmonary status limiting activity ? ?   ?  ?PT Treatment Interventions DME instruction;Gait training;Stair training;Functional mobility training;Therapeutic activities;Balance training;Neuromuscular re-education;Therapeutic exercise;Patient/family education   ? ?PT Goals (Current goals can be found in the Care Plan section)  ?Acute Rehab PT Goals ?Patient Stated Goal: none stated ?PT Goal Formulation: Patient unable to participate in goal setting ?Time For Goal Achievement: 05/31/21 ?Potential to Achieve Goals:  Fair ? ?  ?Frequency Min 3X/week ?  ? ? ?Co-evaluation   ?  ?  ?  ?  ? ? ?  ?AM-PAC PT "6 Clicks" Mobility  ?Outcome Measure Help needed turning from your back to your side while in a flat bed without using bedrails?: A Little ?Help needed moving from lying on your back to  sitting on the side of a flat bed without using bedrails?: A Little ?Help needed moving to and from a bed to a chair (including a wheelchair)?: A Little ?Help needed standing up from a chair using your arms (e.g., wheelchair or bed

## 2021-05-17 NOTE — Progress Notes (Signed)
PT Cancellation Note ? ?Patient Details ?Name: Nicole Mercado ?MRN: [id number] ?DOB: October 13, 1940 ? ? ?Cancelled Treatment:    Reason Eval/Treat Not Completed: Patient at procedure or test/unavailable.  Pt is still in EEG with data collecting so will retry at another time. ? ? ?Ramond Dial ?05/17/2021, 11:06 AM ? ?Mee Hives, PT PhD ?Acute Rehab Dept. Number: [facility] [phone] and [place] [phone] ? ?

## 2021-05-17 NOTE — Progress Notes (Signed)
EEG complete - results pending 

## 2021-05-17 NOTE — Progress Notes (Signed)
ANTICOAGULATION CONSULT NOTE  ? ?Pharmacy Consult for Apixaban ?Indication: atrial fibrillation ? ?No Known Allergies ? ?Patient Measurements: ?Height: 5' (152.4 cm) ?Weight: 44.3 kg (97 lb 10.6 oz) ?IBW/kg (Calculated) : 45.5 ? ?Vital Signs: ?Temp: 98.4 ?F (36.9 ?C) (05/12 0758) ?Temp Source: Oral (05/12 0758) ?BP: 154/63 (05/12 0329) ?Pulse Rate: 94 (05/12 0329) ? ?Labs: ?Recent Labs  ?  05/16/21 ?2951 05/16/21 ?8841  ?HGB 14.3 13.6  ?HCT 42.1 40.0  ?PLT 128*  --   ?APTT 28  --   ?LABPROT 16.5*  --   ?INR 1.3*  --   ?CREATININE 1.25* 1.10*  ? ? ?Estimated Creatinine Clearance: 28.5 mL/min (A) (by C-G formula based on SCr of 1.1 mg/dL (H)). ? ? ?Medical History: ?Past Medical History:  ?Diagnosis Date  ? Aortic stenosis   ? a. Echocardiogram (01/11/14):  Mild LVH, EF 60-65%, Gr 1 DD, possible bicuspid AV, severe AS (mean 61 mmHg, peak 104 mmHg), mild MVP of post leaflet, mild MR, PASP 33 mmHg.;  b. 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 Arthritis   ? Atrial fibrillation ([place])   ? post op after CABG+AVR >> Amiodarone  ? CAD (coronary artery disease)   ? a. LHC (01/13/14):  dLM 70 extending into oLAD, mLAD 40-50, pD1 80, pD2 70, ostial/prox OM1 70 >> CABG (L-LAD, S-OM1, S-D1, S-D2)  // Myoview 10/22: Fixed defect anterolateral wall consistent with artifact, EF 71, no ischemia, low risk  ? Carotid artery occlusion   ? a. s/p L CEA 2013;  b.  Carotid US (1/16):  Bilateral ICA 1-39% // Carotid US 10/22: Bilateral ICA 1-39  ? CLL (chronic lymphocytic leukemia) ([place])   ? slow leukemia---Dr  Murinson  ? H/O exercise stress test   ? NSSTT  ? Hx of cardiovascular stress test   ? a. Nuclear stress test That (4/13): Normal perfusion, EF 74%  ? Hx of echocardiogram   ? Echo (2/16):  Mild LVH, EF 60-65%, no RWMA, Gr 2 DD, AVR ok (mean 9 mmHg), trivial AI, mild MR, mild LAE, PASP 40 mmHg  ? Hypertension   ? PAD (peripheral artery disease) ([place])   ? LE Arterial US 10/22: R - pCFA, oSFA, dSFA 30-49; L - pCFA + mSFA 50-74, pPop 30-49 >>  referred to [facility]  ? Pancreatitis   ? h/o  ? Thrombocytopenia ([place]) 11/19/2010  ? ? ?Medications:  ?Medications Prior to Admission  ?Medication Sig Dispense Refill Last Dose  ? acetaminophen (TYLENOL) 325 MG tablet Take 650 mg by mouth every 6 (six) hours as needed for mild pain or headache.    05/15/2021  ? apixaban (ELIQUIS) 5 MG TABS tablet Take 1 tablet (5 mg total) by mouth 2 (two) times daily. 180 tablet 2 05/15/2021 at 1700  ? aspirin 81 MG tablet Take 81 mg by mouth daily.   05/15/2021  ? atorvastatin (LIPITOR) 10 MG tablet TAKE 1 TABLET BY MOUTH ONCE DAILY AT  6  IN  THE  EVENING (Patient taking differently: Take 10 mg by mouth daily.) 90 tablet 2 05/15/2021  ? Calcium Carb-Cholecalciferol 600-10 MG-MCG CAPS Take 600 mg by mouth daily.   05/15/2021  ? diltiazem (CARDIZEM CD) 180 MG 24 hr capsule Take 1 capsule (180 mg total) by mouth daily. 90 capsule 2 05/15/2021  ? metoprolol tartrate (LOPRESSOR) 100 MG tablet Take 1 tablet (100 mg total) by mouth 2 (two) times daily. 180 tablet 3 05/15/2021 at 1700  ? Multiple Vitamins-Minerals (HM MULTIVITAMIN ADULT GUMMY PO) Take 1  tablet by mouth daily. Vita fusion   05/15/2021  ? oxybutynin (DITROPAN-XL) 10 MG 24 hr tablet Take 10 mg by mouth daily.   05/15/2021  ? ? ?Assessment: ?81 yo F admitted with acute onset of severe aphasia and left-sided facial droop.  Pt on apixaban '5mg'$  BID for hx afib.  CT and MRI neg.  Pharmacy has been asked to restart apixaban.  Please note, patient now meets criteria for reduced dose (age >=81, weight <60kg).  Will need a new prescription for 2.'5mg'$  dose at discharge. ? ?Goal of Therapy:  ?Therapeutic anticoagulation ?Monitor platelets by anticoagulation protocol: Yes ?  ?Plan:  ?Start Apixaban 2.'5mg'$  PO BID ?**Note dose change - pt will need a new prescription at discharge. ? ? ?[employer], Pharm.D., BCPS ?Clinical Pharmacist ?Clinical phone for 05/17/2021 from 7:30-3:00 is [phone]. ? ?**Pharmacist phone directory can be found on [email or web address]  listed under [place]. ? ?05/17/2021 9:04 AM ? ? ? ?

## 2021-05-17 NOTE — Significant Event (Signed)
AD called in room and pt was in the bathroom with PT. She was unresponsive and had a BM. Pt was pale. Rapid called. VSS. BG checked no concerns. Pt has now returned back to baseline. MD for IM notified.  ?

## 2021-05-17 NOTE — Care Management Obs Status (Signed)
MEDICARE OBSERVATION STATUS NOTIFICATION ? ? ?Patient Details  ?Name: Nicole Mercado ?MRN: [id number] ?Date of Birth: 06-Apr-1940 ? ? ?Medicare Observation Status Notification Given:  Yes ? ? ? ?Pollie Friar, RN ?05/17/2021, 3:44 PM ?

## 2021-05-18 LAB — GLUCOSE, CAPILLARY
Glucose-Capillary: 100 mg/dL — ABNORMAL HIGH (ref 70–99)
Glucose-Capillary: 103 mg/dL — ABNORMAL HIGH (ref 70–99)
Glucose-Capillary: 133 mg/dL — ABNORMAL HIGH (ref 70–99)
Glucose-Capillary: 129 mg/dL — ABNORMAL HIGH (ref 70–99)
Glucose-Capillary: 149 mg/dL — ABNORMAL HIGH (ref 70–99)

## 2021-05-18 LAB — BASIC METABOLIC PANEL
Anion gap: 11 (ref 5–15)
BUN: 14 mg/dL (ref 8–23)
CO2: 22 mmol/L (ref 22–32)
Calcium: 9.7 mg/dL (ref 8.9–10.3)
Chloride: 101 mmol/L (ref 98–111)
Creatinine, Ser: 1 mg/dL (ref 0.44–1.00)
GFR, Estimated: 57 mL/min — ABNORMAL LOW (ref >60)
Glucose, Bld: 147 mg/dL — ABNORMAL HIGH (ref 70–99)
Potassium: 3.2 mmol/L — ABNORMAL LOW (ref 3.5–5.1)
Sodium: 134 mmol/L — ABNORMAL LOW (ref 135–145)

## 2021-05-18 MED ORDER — POLYETHYLENE GLYCOL 3350 17 G PO PACK
17.0000 g | PACK | Freq: Every day | ORAL | Status: DC
  Administered 2021-05-18 – 2021-05-20 (×3): 17 g via ORAL
  Filled 2021-05-18 (×3): qty 1

## 2021-05-18 MED ORDER — LACTULOSE ENEMA
300.0000 mL | Freq: Every day | ORAL | Status: DC | PRN
  Filled 2021-05-18: qty 300

## 2021-05-18 MED ORDER — BISACODYL 10 MG RE SUPP
10.0000 mg | Freq: Every day | RECTAL | Status: DC | PRN
  Administered 2021-05-18: 10 mg via RECTAL
  Filled 2021-05-18: qty 1

## 2021-05-18 MED ORDER — POTASSIUM CHLORIDE 20 MEQ PO PACK
40.0000 meq | PACK | Freq: Two times a day (BID) | ORAL | Status: AC
  Administered 2021-05-18 (×2): 40 meq via ORAL
  Filled 2021-05-18 (×2): qty 2

## 2021-05-18 NOTE — Progress Notes (Signed)
Speech Language Pathology Treatment: Dysphagia  ?Patient Details ?Name: KIARI HOSMER ?MRN: [id number] ?DOB: [birth year] ?Today's Date: 05/18/2021 ?Time: 5643-3295 ?SLP Time Calculation (min) (ACUTE ONLY): 27 min ? ?Assessment / Plan / Recommendation ?Clinical Impression ? Skilled observation of swallowing abilities complete during pm meal. Patient able to self feed dysphagia 3 solids and thin liquids. Cough post swallow noted on initial sip of thin liquids which was large in nature, suspect delayed swallow initiation. Intermittent coughing noted during meal intake in approximately 10% of sips of thin liquids only, again particularly when sips were large in nature. SLP provided instruction to take thin liquids via small cup sips which she was then able to carry out with min verbal cueing and no overt s/s of aspiration. Although compensatory strategies do appear to assist in reducing s/s of aspiration, instrumental testing will be beneficial to evaluate swallowing physiology, determine exact cause of dysphagia and least restrictive diet. Will plan for MBS on 5/15.  ?  ?HPI HPI: This is an 81 year old patient with a history of aortic stenosis, A-fib on Eliquis, CAD, CLL, PAD, thrombocytopenia who presented with acute onset of severe aphasia and left-sided facial droop. MRI shows No acute intracranial abnormality. Pt was admitted on 03/30/21 for a similar presentation. SLP cognitive evaluation at that time reported "She had an odd affect and had delays in responses even to basic questions. She participated in completing the [place] ([place] Status exam) and received a score of 13 out of possible 30". ?  ?   ?SLP Plan ? Continue with current plan of care ? ?  ?  ?Recommendations for follow up therapy are one component of a multi-disciplinary discharge planning process, led by the attending physician.  Recommendations may be updated based on patient status, additional functional criteria and insurance  authorization. ?  ? ?Recommendations  ?Diet recommendations: Dysphagia 3 (mechanical soft);Thin liquid ?Liquids provided via: Cup;No straw ?Medication Administration: Whole meds with puree ?Supervision: Patient able to self feed;Full supervision/cueing for compensatory strategies ?Compensations: Slow rate;Small sips/bites ?Postural Changes and/or Swallow Maneuvers: Seated upright 90 degrees  ?   ?    ?   ? ? ? ? Oral Care Recommendations: Oral care BID ?Follow Up Recommendations: Home health SLP ?Assistance recommended at discharge: Frequent or constant Supervision/Assistance ?SLP Visit Diagnosis: Dysphagia, unspecified (R13.10) ?Plan: Continue with current plan of care ? ? ? ? ?  ?  ?Amilio Zehnder MA, CCC-SLP  ? ?Ceaira Ernster Meryl ? ?05/18/2021, 1:34 PM ?

## 2021-05-18 NOTE — Progress Notes (Deleted)
? ?HD#0 ?SUBJECTIVE:  ?Patient Summary:  ?Nicole Mercado is an 81 year old female with a PMHx of atrial fibrillation on Eliquis, aortic stenosis s/p prosthetic valve, CAD s/p CABG, carotid artery disease s/p CEA, TIA in 2022, PAD, hyperlipidemia, CLL, and chronic thrombocytopenia. She presented to the emergency department as a code stroke for aphasia and L sided facial droop. Symptoms appear to have resolved.  ? ?Overnight Events:  ?NAEON ? ?Interim History:  ?Pt lying comfortably in bed this morning. States that she feels good overall. She is alert and oriented to location and is amenable to SNF placement, especially so if it is [facility]. ? ?OBJECTIVE:  ?Vital Signs: ?Vitals:  ? 05/17/21 2004 05/17/21 2335 05/18/21 0320 05/18/21 0754  ?BP: (!) 157/58 (!) 144/65 (!) 148/67 127/69  ?Pulse: 82 97 (!) 104 92  ?Resp: '17 20 17 20  '$ ?Temp: 98.4 ?F (36.9 ?C) 98.2 ?F (36.8 ?C) 98.3 ?F (36.8 ?C) 98.2 ?F (36.8 ?C)  ?TempSrc: Oral Oral Oral Oral  ?SpO2: 100% 98% 99%   ?Weight:      ?Height:      ? ? ? ?No intake or output data in the 24 hours ending 05/18/21 0921 ? ?Net IO Since Admission: 178.03 mL [05/18/21 0921] ? ?Physical Exam: ?Constitutional: Appears well-developed and well-nourished. No distress.  ?HENT: Normocephalic and atraumatic, EOMI, conjunctiva normal, moist mucous membranes ?Cardiovascular: Normal rate, regular rhythm, S1 and S2 present, no murmurs, rubs, gallops.  Distal pulses intact. ?Respiratory: Effort is normal on room air. CTAB. ?Abdominal: NTTP, +BS ?Musculoskeletal: Normal bulk and tone.  No peripheral edema noted. ?Skin: Warm and dry.  No rash, erythema, lesions noted. ?Neurological: Alert and oriented x4, no apparent focal deficits noted.  ?Psychiatric: Normal mood and affect. Behavior is normal.   ? ? ?ASSESSMENT/PLAN:  ?Assessment: ?Nicole Mercado is an 81 year old female with a PMHx of atrial fibrillation on Eliquis, aortic stenosis s/p prosthetic valve, CAD s/p CABG, carotid artery disease s/p CEA,  TIA in 2022, PAD, hyperlipidemia, CLL, and chronic thrombocytopenia. She presented to the emergency department as a code stroke for aphasia and L sided facial droop. Symptoms appear to have resolved and MRI is negative. Likely TIA.  ? ? ?Plan: ? ?#TIA ?Initial NIHSS was 6 (scoring 2 for LOC questions, 1 for facial palsy, 1 for LLE weakness, 2 for language). CTH negative. CTA with no LVO. No TNK given out of window and on Eliquis. On my exam with IMTS several hours later, NIHSS of 0 and patient was without any signs of aphasia or dysarthria. MRI came back with no acute stroke. Presumed TIA, but will wait for stroke team assessment and recs.   ?-F/u stroke recs ?-Continue home Eliquis at reduced dose of 2.5 mg BID ?-Hold home ASA ?-Continue home Lipitor 10 mg ?-Permissive HTN x 48 hrs (BP <220/110), PRN Labetalol ?-q4 Neurochecks ?-PT/OT/SLP ?-Dys 3 diet ?-Ambulatory stroke referral on d/c ? ?#Paroxysmal Atrial fibrillation ?Had a CV on 5/3 that was successful. Found to be in RVR on arrival. Given Cardizem 20 mg IV followed by gtt with rates controlled around 100-110. Transitioned to her home regimen 5/12 with HR in 70's-90's.  ?-Continue Cardizem CD 180 mg daily and Lopressor 100 mg BID ?-Telemetry ? 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 Currently at baseline. ?-Daily BMP ?-Avoid nephrotoxins  ?  ?#Chronic thrombocytopenia ?Plts at 128, appears to be bl.  ? ?FEN/GI: Dys 3 diet ?VTE: Eliquis ?Dispo: TBD ?  Code Status: DNR ?Medically stable for D/C, awaiting SNF placement ? ?Signature: ?Corky Sox, MD ?PGY-1 ?Pager: [phone] ?Please contact the on call pager after 5 pm and on weekends at [phone].  ? ?

## 2021-05-18 NOTE — TOC Initial Note (Signed)
Transition of Care (TOC) - Initial/Assessment Note  ? ? ?Patient Details  ?Name: Nicole Mercado ?MRN: [id number] ?Date of Birth: 1940/10/10 ? ?Transition of Care (TOC) CM/SW Contact:    ?Nicole Cunas, LCSW ?Phone Number: ?05/18/2021, 3:39 PM ? ?Clinical Narrative:  Spoke to pt's POA/son Nicole Mercado [phone] re PT/OT recommendation for SNF. Pt with recent SNF stay at [facility] and pt's son prefers this facility again. Reviewed SNF placement process and answered questions. Will begin SNF search and f/u with offers as available. [name], will need to update them with facility choice once known.      ? ?Nicole Mercado, MSW, LCSW ?[phone] (coverage) ? ?           ? ? ?Expected Discharge Plan: [place] ?Barriers to Discharge: Continued Medical Work up, SNF Pending bed offer, Insurance Authorization ? ? ?Patient Goals and CMS Choice ?  ?CMS Medicare.gov Compare Post Acute Care list provided to:: Patient ?Choice offered to / list presented to : Nicole Mercado POA / Guardian ? ?Expected Discharge Plan and Services ?Expected Discharge Plan: [place] ?  ?  ?  ?Living arrangements for the past 2 months: Apartment ?                ?  ?  ?  ?  ?  ?  ?  ?  ?  ?  ? ?Prior Living Arrangements/Services ?Living arrangements for the past 2 months: Apartment ?Lives with:: Self ?Patient language and need for interpreter reviewed:: No ?Do you feel safe going back to the place where you live?: Yes      ?Need for Family Participation in Patient Care: Yes (Comment) ?Care giver support system in place?: No (comment) ?  ?Criminal Activity/Legal Involvement Pertinent to Current Situation/Hospitalization: No - Comment as needed ? ?Activities of Daily Living ?  ?  ? ?Permission Sought/Granted ?Permission sought to share information with : [name] ?Permission granted to share information with : Yes, Verbal Permission Granted ?   ?   ?   ?   ? ?Emotional  Assessment ?Appearance:: Appears stated age ?Attitude/Demeanor/Rapport: Engaged ?Affect (typically observed): Accepting ?Orientation: : Fluctuating Orientation (Suspected and/or reported [place]) ?Alcohol / Substance Use: Not Applicable ?Psych Involvement: No (comment) ? ?Admission diagnosis:  Aphasia [R47.01] ?TIA (transient ischemic attack) [G45.9] ?Atrial fibrillation, unspecified type ([place]) [I48.91] ?Patient Active Problem List  ? Diagnosis Date Noted  ? Aphasia   ? Stroke-like episode   ? Hypokalemia 04/04/2021  ? Hypomagnesemia 04/04/2021  ? Physical deconditioning 04/03/2021  ? Pleural effusion 04/03/2021  ? E-coli UTI 04/03/2021  ? TIA (transient ischemic attack) 03/30/2021  ? Elevated LFTs 03/30/2021  ? Hyponatremia 03/30/2021  ? Persistent atrial fibrillation with RVR ([place]) 03/19/2021  ? PAD (peripheral artery disease) ([place]) 10/19/2020  ? Stage 3a chronic kidney disease (CKD) ([place]) 03/25/2019  ? History of basal cell cancer 02/11/2019  ? PAC (premature atrial contraction) 01/14/2019  ? Goals of care, counseling/discussion 12/08/2018  ? Skin lesion 12/18/2016  ? Hypercalcemia 06/19/2016  ? Drug-induced neutropenia ([place]) 05/27/2016  ? Hematuria, gross 08/28/2015  ? Easy bruising 05/31/2015  ? Encounter for chemotherapy management 11/03/2014  ? Essential hypertension 10/30/2014  ? 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 S/P AVR 01/17/2014  ? CAD (coronary artery disease), native coronary artery 01/16/2014  ? Aortic stenosis 01/11/2014  ? Arrhythmia 01/11/2014  ? Syncope and collapse 01/11/2014  ? Faintness   ? Aftercare following surgery of  the circulatory system, NEC 08/18/2013  ? Occlusion and stenosis of carotid artery without mention of cerebral infarction 06/03/2011  ? CLL (chronic lymphocytic leukemia) ([place]) 03/21/2011  ? Cardiac murmur 03/21/2011  ? Carotid artery stenosis 03/21/2011  ? Thrombocytopenia ([place]) 11/19/2010  ? Lymphadenopathy 11/11/2010  ? ?PCP:  Kristie Cowman, MD ?Pharmacy:   ?[place] [place] [facility] [address] ?[address] ?[place] [address] ?Phone: [phone] Fax: [phone] ? ? ? ? ?Social Determinants of Health (SDOH) Interventions ?  ? ?Readmission Risk Interventions ?   ? View : No data to display.  ?  ?  ?  ? ? ? ?

## 2021-05-18 NOTE — Progress Notes (Signed)
? ?HD#0 ?SUBJECTIVE:  ?Patient Summary:  ?Nicole Mercado is an 81 year old female with a PMHx of atrial fibrillation on Eliquis, aortic stenosis s/p prosthetic valve, CAD s/p CABG, carotid artery disease s/p CEA, TIA in 2022, PAD, hyperlipidemia, CLL, and chronic thrombocytopenia. She presented to the emergency department as a code stroke for aphasia and L sided facial droop. Symptoms appear to have resolved.  ? ?Overnight Events:  ?Patient was confused overnight and had a mechanical fall. Stat CT head without contrast showed no evidence of brain bleed.  ? ?Interim History:  ?Patient denies any pain. She has been able to eat and drink without difficulty.  ? ? ?OBJECTIVE:  ?Vital Signs: ?Vitals:  ? 05/17/21 2004 05/17/21 4196 05/18/21 0320 05/18/21 0754  ?BP: (!) 157/58 (!) 144/65 (!) 148/67 127/69  ?Pulse: 82 97 (!) 104 92  ?Resp: '17 20 17 20  '$ ?Temp: 98.4 ?F (36.9 ?C) 98.2 ?F (36.8 ?C) 98.3 ?F (36.8 ?C) 98.2 ?F (36.8 ?C)  ?TempSrc: Oral Oral Oral Oral  ?SpO2: 100% 98% 99%   ?Weight:      ?Height:      ? ? ? ?No intake or output data in the 24 hours ending 05/18/21 1150 ? ?Net IO Since Admission: 178.03 mL [05/18/21 1150] ? ?Physical Exam: ?Constitutional: Appears well-developed and well-nourished. No distress.  ?HENT: Normocephalic and atraumatic, EOMI, conjunctiva normal, moist mucous membranes ?Cardiovascular: Normal rate, regular rhythm, S1 and S2 present, no murmurs, rubs, gallops.  Distal pulses intact. ?Respiratory: Effort is normal on room air. CTAB. ?Abdominal: NTTP, +BS ?Musculoskeletal: Normal bulk and tone.  No peripheral edema noted. ?Skin: Warm and dry.  No rash, erythema, lesions noted. ?Neurological: Alert and oriented to person and place but not time, does not know city or state that she is in. no apparent focal deficits noted.  ?Psychiatric: Normal mood and affect. Behavior is normal.   ? ? ?ASSESSMENT/PLAN:  ?Assessment: ?Nicole Mercado is an 81 year old female with a PMHx of atrial fibrillation on  Eliquis, aortic stenosis s/p prosthetic valve, CAD s/p CABG, carotid artery disease s/p CEA, TIA in 2022, PAD, hyperlipidemia, CLL, and chronic thrombocytopenia. She presented to the emergency department as a code stroke for aphasia and L sided facial droop. Symptoms appear to have resolved and MRI is negative. Likely TIA.  ? ? ?Plan: ? ?#TIA ?Initial NIHSS was 6 (scoring 2 for LOC questions, 1 for facial palsy, 1 for LLE weakness, 2 for language). CTH negative. CTA with no LVO. No TNK given out of window and on Eliquis. On exam a few hours later by IMTS team, NIHSS of 0 and patient was without aphasia or dysarthria. MRI brain without evidence of acute stroke. Presumed TIA.  ?-F/u stroke recs ?-Continue home Eliquis at reduced dose of 2.5 mg BID ?-Continue to Hold home ASA ?-Continue home Lipitor 10 mg ?-Goal normotension, PRN Labetalol ?-q4 Neurochecks ?-PT/OT/SLP recommending SNF  ?-Dys 3 diet (SLP recommending further eval) ?-Son requesting [facility] aide referral on D/C ?-Ambulatory stroke referral on d/c ? ?#Paroxysmal Atrial fibrillation ?Had TEE w/ cardioversion on 5/3 that was successful. Found to be in RVR on arrival. Given Cardizem 20 mg IV followed by cardizem infusion with rates between 100-110. Transitioned to her home regimen 5/12 Cardizem CD 180 mg daily and metoprolol tartrate 100 mg BID and  heart rates have been between 91-113. ?-Continue home AV nodal blockade ? ?#AKI vs CKD 3a ?SCr improved this AM, previous values between 1 and 1.3. Currently at baseline. ?-Daily  BMP ?-Avoid nephrotoxins  ?  ?#Chronic thrombocytopenia ?Plts 143 ? ?FEN/GI: Dys 3 diet ?VTE: Eliquis ?Dispo: TBD ?Code Status: DNR ? ?Signature: ?Rick Duff, MD PGY-2 ?Internal Medicine  ?Pager [phone] ? ? ?

## 2021-05-18 NOTE — NC FL2 (Signed)
?[place] MEDICAID FL2 LEVEL OF CARE SCREENING TOOL  ?  ? ?IDENTIFICATION  ?Patient Name: ?Nicole Mercado Birthdate: June 30, 1940 Sex: female Admission Date (Current Location): ?05/16/2021  ?[place] and [place] Number: ? [place] ?  Facility and Address:  ?[facility] [place] [address] [place], [place] [address] ?     Provider Number: ?[id number]  ?Attending Physician Name and Address:  ?[name], MD ? Relative Name and Phone Number:  ?  ?   ?Current Level of Care: ?Hospital Recommended Level of Care: ?[place] Prior Approval Number: ?  ? ?Date Approved/Denied: ?  PASRR Number: ?[id number] ? ?Discharge Plan: ?SNF ?  ? ?Current Diagnoses: ?Patient Active Problem List  ? Diagnosis Date Noted  ? Aphasia   ? Stroke-like episode   ? Hypokalemia 04/04/2021  ? Hypomagnesemia 04/04/2021  ? Physical deconditioning 04/03/2021  ? Pleural effusion 04/03/2021  ? E-coli UTI 04/03/2021  ? TIA (transient ischemic attack) 03/30/2021  ? Elevated LFTs 03/30/2021  ? Hyponatremia 03/30/2021  ? Persistent atrial fibrillation with RVR ([place]) 03/19/2021  ? PAD (peripheral artery disease) ([place]) 10/19/2020  ? Stage 3a chronic kidney disease (CKD) ([name]) 03/25/2019  ? History of basal cell cancer 02/11/2019  ? PAC (premature atrial contraction) 01/14/2019  ? Goals of care, counseling/discussion 12/08/2018  ? Skin lesion 12/18/2016  ? Hypercalcemia 06/19/2016  ? Drug-induced neutropenia ([name]) 05/27/2016  ? Hematuria, gross 08/28/2015  ? Easy bruising 05/31/2015  ? Encounter for chemotherapy management 11/03/2014  ? Essential hypertension 10/30/2014  ? 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 S/P AVR 01/17/2014  ? CAD (coronary artery disease), native coronary artery 01/16/2014  ? Aortic stenosis 01/11/2014  ? Arrhythmia 01/11/2014  ? Syncope and collapse 01/11/2014  ? Faintness   ? Aftercare following surgery of the circulatory system, [place] 08/18/2013  ? Occlusion and stenosis of carotid artery  without mention of cerebral infarction 06/03/2011  ? CLL (chronic lymphocytic leukemia) ([name]) 03/21/2011  ? Cardiac murmur 03/21/2011  ? Carotid artery stenosis 03/21/2011  ? Thrombocytopenia ([name]) 11/19/2010  ? Lymphadenopathy 11/11/2010  ? ? ?Orientation RESPIRATION BLADDER Height & Weight   ?  ? (fluctuating orientation) ? Normal Continent Weight: 97 lb 10.6 oz (44.3 kg) ?Height:  5' (152.4 cm)  ?BEHAVIORAL SYMPTOMS/MOOD NEUROLOGICAL BOWEL NUTRITION STATUS  ?    Continent    ?AMBULATORY STATUS COMMUNICATION OF NEEDS Skin   ?Limited Assist Verbally Normal ?  ?  ?  ?    ?     ?     ? ? ?Personal Care Assistance Level of Assistance  ?Bathing, Dressing Bathing Assistance: Limited assistance ?Feeding assistance: Limited assistance ?Dressing Assistance: Limited assistance ?   ? ?Functional Limitations Info  ?Sight, Hearing, Speech Sight Info: Adequate ?Hearing Info: Adequate ?Speech Info: Adequate  ? ? ?SPECIAL CARE FACTORS FREQUENCY  ?PT (By licensed PT), OT (By licensed OT)   ?  ?  ?  ?  ?  ?  ?   ? ? ?Contractures Contractures Info: Not present  ? ? ?Additional Factors Info  ?Code Status Code Status Info: DNR ?  ?  ?  ?  ?   ? ?Current Medications (05/18/2021):  This is the current hospital active medication list ?Current Facility-Administered Medications  ?Medication Dose Route Frequency Provider Last Rate Last Admin  ? acetaminophen (TYLENOL) suppository 650 mg  650 mg Rectal Q4H PRN Rick Duff, MD      ? apixaban Arne [place]) tablet 2.5 mg  2.5 mg Oral  BID Corky Sox, MD   2.5 mg at 05/18/21 7505  ? diltiazem (CARDIZEM CD) 24 hr capsule 180 mg  180 mg Oral Daily Corky Sox, MD   180 mg at 05/18/21 1833  ? labetalol (NORMODYNE) injection 10 mg  10 mg Intravenous Q2H PRN Corky Sox, MD      ? metoprolol tartrate (LOPRESSOR) tablet 100 mg  100 mg Oral BID Corky Sox, MD   100 mg at 05/18/21 5825  ? potassium chloride (KLOR-CON) packet 40 mEq  40 mEq Oral BID Rick Duff, MD   40 mEq at  05/18/21 1357  ? ? ? ?Discharge Medications: ?Please see discharge summary for a list of discharge medications. ? ?Relevant Imaging Results: ? ?Relevant Lab Results: ? ? ?Additional Information ?SS#: [id number] ? ?Nicole Mercado,  ? ? ? ? ?

## 2021-05-19 DIAGNOSIS — M1990 Unspecified osteoarthritis, unspecified site: Secondary | ICD-10-CM | POA: Diagnosis present

## 2021-05-19 DIAGNOSIS — Z8249 Family history of ischemic heart disease and other diseases of the circulatory system: Secondary | ICD-10-CM | POA: Diagnosis not present

## 2021-05-19 DIAGNOSIS — Z79899 Other long term (current) drug therapy: Secondary | ICD-10-CM | POA: Diagnosis not present

## 2021-05-19 DIAGNOSIS — Z951 Presence of aortocoronary bypass graft: Secondary | ICD-10-CM | POA: Diagnosis not present

## 2021-05-19 DIAGNOSIS — N1831 Chronic kidney disease, stage 3a: Secondary | ICD-10-CM | POA: Diagnosis present

## 2021-05-19 DIAGNOSIS — Z7901 Long term (current) use of anticoagulants: Secondary | ICD-10-CM | POA: Diagnosis not present

## 2021-05-19 DIAGNOSIS — Z953 Presence of xenogenic heart valve: Secondary | ICD-10-CM | POA: Diagnosis not present

## 2021-05-19 DIAGNOSIS — I4891 Unspecified atrial fibrillation: Secondary | ICD-10-CM | POA: Diagnosis not present

## 2021-05-19 DIAGNOSIS — Z66 Do not resuscitate: Secondary | ICD-10-CM | POA: Diagnosis present

## 2021-05-19 DIAGNOSIS — Z90710 Acquired absence of both cervix and uterus: Secondary | ICD-10-CM | POA: Diagnosis not present

## 2021-05-19 DIAGNOSIS — R4701 Aphasia: Secondary | ICD-10-CM | POA: Diagnosis present

## 2021-05-19 DIAGNOSIS — I4819 Other persistent atrial fibrillation: Secondary | ICD-10-CM | POA: Diagnosis not present

## 2021-05-19 DIAGNOSIS — E785 Hyperlipidemia, unspecified: Secondary | ICD-10-CM | POA: Diagnosis present

## 2021-05-19 DIAGNOSIS — G459 Transient cerebral ischemic attack, unspecified: Secondary | ICD-10-CM | POA: Diagnosis present

## 2021-05-19 DIAGNOSIS — I739 Peripheral vascular disease, unspecified: Secondary | ICD-10-CM | POA: Diagnosis present

## 2021-05-19 DIAGNOSIS — I129 Hypertensive chronic kidney disease with stage 1 through stage 4 chronic kidney disease, or unspecified chronic kidney disease: Secondary | ICD-10-CM | POA: Diagnosis present

## 2021-05-19 DIAGNOSIS — Z7982 Long term (current) use of aspirin: Secondary | ICD-10-CM | POA: Diagnosis not present

## 2021-05-19 DIAGNOSIS — D696 Thrombocytopenia, unspecified: Secondary | ICD-10-CM | POA: Diagnosis present

## 2021-05-19 DIAGNOSIS — I480 Paroxysmal atrial fibrillation: Secondary | ICD-10-CM | POA: Diagnosis present

## 2021-05-19 DIAGNOSIS — I2510 Atherosclerotic heart disease of native coronary artery without angina pectoris: Secondary | ICD-10-CM | POA: Diagnosis present

## 2021-05-19 DIAGNOSIS — Z823 Family history of stroke: Secondary | ICD-10-CM | POA: Diagnosis not present

## 2021-05-19 DIAGNOSIS — Z856 Personal history of leukemia: Secondary | ICD-10-CM | POA: Diagnosis not present

## 2021-05-19 DIAGNOSIS — E875 Hyperkalemia: Secondary | ICD-10-CM | POA: Diagnosis not present

## 2021-05-19 DIAGNOSIS — I4892 Unspecified atrial flutter: Secondary | ICD-10-CM | POA: Diagnosis present

## 2021-05-19 LAB — CBC
HCT: 40.5 % (ref 36.0–46.0)
Hemoglobin: 13 g/dL (ref 12.0–15.0)
MCH: 31.7 pg (ref 26.0–34.0)
MCHC: 32.1 g/dL (ref 30.0–36.0)
MCV: 98.8 fL (ref 80.0–100.0)
Platelets: 142 10*3/uL — ABNORMAL LOW (ref 150–400)
RBC: 4.1 MIL/uL (ref 3.87–5.11)
RDW: 13 % (ref 11.5–15.5)
WBC: 8.5 10*3/uL (ref 4.0–10.5)
nRBC: 0 % (ref 0.0–0.2)

## 2021-05-19 LAB — BASIC METABOLIC PANEL
Anion gap: 5 (ref 5–15)
BUN: 17 mg/dL (ref 8–23)
CO2: 25 mmol/L (ref 22–32)
Calcium: 9.9 mg/dL (ref 8.9–10.3)
Chloride: 106 mmol/L (ref 98–111)
Creatinine, Ser: 1.14 mg/dL — ABNORMAL HIGH (ref 0.44–1.00)
GFR, Estimated: 49 mL/min — ABNORMAL LOW (ref >60)
Glucose, Bld: 136 mg/dL — ABNORMAL HIGH (ref 70–99)
Potassium: 6.3 mmol/L (ref 3.5–5.1)
Sodium: 136 mmol/L (ref 135–145)

## 2021-05-19 LAB — GLUCOSE, CAPILLARY
Glucose-Capillary: 105 mg/dL — ABNORMAL HIGH (ref 70–99)
Glucose-Capillary: 106 mg/dL — ABNORMAL HIGH (ref 70–99)
Glucose-Capillary: 119 mg/dL — ABNORMAL HIGH (ref 70–99)
Glucose-Capillary: 121 mg/dL — ABNORMAL HIGH (ref 70–99)
Glucose-Capillary: 128 mg/dL — ABNORMAL HIGH (ref 70–99)
Glucose-Capillary: 132 mg/dL — ABNORMAL HIGH (ref 70–99)
Glucose-Capillary: 133 mg/dL — ABNORMAL HIGH (ref 70–99)

## 2021-05-19 LAB — POTASSIUM: Potassium: 4.8 mmol/L (ref 3.5–5.1)

## 2021-05-19 MED ORDER — CALCIUM GLUCONATE-NACL 1-0.675 GM/50ML-% IV SOLN
1.0000 g | Freq: Once | INTRAVENOUS | Status: AC
  Administered 2021-05-19: 1000 mg via INTRAVENOUS
  Filled 2021-05-19: qty 50

## 2021-05-19 NOTE — Progress Notes (Signed)
? ?HD#0 ?SUBJECTIVE:  ?Patient Summary:  ?Nicole Mercado is an 81 year old female with a PMHx of atrial fibrillation on Eliquis, aortic stenosis s/p prosthetic valve, CAD s/p CABG, carotid artery disease s/p CEA, TIA in 2022, PAD, hyperlipidemia, CLL, and chronic thrombocytopenia. She presented to the emergency department as a code stroke for aphasia and L sided facial droop. Symptoms appear to have resolved.  ? ?Overnight Events:  ?No acute events overnight. ? ?Interim History:  ?Patient denies any pain.  She has been able to eat and drink without any issues.  She has also been able to urinate and defecate without difficulty. ? ? ?OBJECTIVE:  ?Vital Signs: ?Vitals:  ? 05/19/21 0418 05/19/21 0713 05/19/21 0914 05/19/21 1357  ?BP: (!) 151/94 (!) 170/92 136/75 126/73  ?Pulse: 79     ?Resp: 19 17 (!) 21 15  ?Temp: 98.4 ?F (36.9 ?C)   98.1 ?F (36.7 ?C)  ?TempSrc: Oral   Oral  ?SpO2: 100%     ?Weight:      ?Height:      ? ? ? ? ?Intake/Output Summary (Last 24 hours) at 05/19/2021 1533 ?Last data filed at 05/18/2021 2200 ?Gross per 24 hour  ?Intake 360 ml  ?Output --  ?Net 360 ml  ? ? ?Net IO Since Admission: 198.03 mL [05/19/21 1533] ? ?Physical Exam: ?Constitutional: Thin, well-nourished and in no distress.  ?HENT:  ?Head: Normocephalic and atraumatic.  ?Eyes: EOM are normal.  ?Neck: Normal range of motion.  ?Cardiovascular: Normal rate, regular rhythm, intact distal pulses. No gallop and no friction rub.  ?No murmur heard. No lower extremity edema  ?Pulmonary: Non labored breathing on room air, no wheezing or rales  ?Abdominal: Soft. Normal bowel sounds. Non distended and non tender ?Musculoskeletal: Normal range of motion.     ?   General: No tenderness or edema.  ?Neurological: Alert and oriented to person, place, but not time, patient also does not know city or state..  This is unchanged from exam yesterday.  Non focal  ?Skin: Skin is warm and dry.  ? ?ASSESSMENT/PLAN:  ?Assessment: ?Nicole Mercado is an 81 year old female  with a PMHx of atrial fibrillation on Eliquis, aortic stenosis s/p prosthetic valve, CAD s/p CABG, carotid artery disease s/p CEA, TIA in 2022, PAD, hyperlipidemia, CLL, and chronic thrombocytopenia. She presented to the emergency department as a code stroke for aphasia and L sided facial droop. Symptoms appear to have resolved and MRI is negative. Likely TIA.  ? ? ?Plan: ? ?#TIA ?Initial NIHSS was 6 (scoring 2 for LOC questions, 1 for facial palsy, 1 for LLE weakness, 2 for language). CTH negative. CTA with no LVO. No TNK given out of window and on Eliquis. On exam a few hours later by IMTS team, NIHSS of 0 and patient was without aphasia or dysarthria. MRI brain without evidence of acute stroke. Presumed TIA.  ?-F/u stroke recs ?-Continue home Eliquis at reduced dose of 2.5 mg BID ?-Continue to Hold home ASA ?-Continue home Lipitor 10 mg ?-Goal normotension, PRN Labetalol ?-q4 Neurochecks ?-PT/OT/SLP recommending SNF  ?-Dys 3 diet (SLP recommending further eval) ?-Son requesting [facility] aide referral on D/C ?-Ambulatory stroke referral on d/c ? ?#Paroxysmal Atrial fibrillation ?Had TEE w/ cardioversion on 5/3 that was successful. Found to be in RVR on arrival. Given Cardizem 20 mg IV followed by cardizem infusion with rates between 100-110. Transitioned to her home regimen 5/12 Cardizem CD 180 mg daily and metoprolol tartrate 100 mg BID and  heart rates have been between 91-114. ?-Continue home AV nodal blockade ? ?#AKI vs CKD 3a ?SCr improved this AM, previous values between 1 and 1.3. Currently at baseline. ?-Daily BMP ?-Avoid nephrotoxins  ?  ?#Chronic thrombocytopenia ?Platelets are stable and only mildly decreased at 142,000. ? ?FEN/GI: Dys 3 diet ?VTE: Eliquis ?Dispo: TBD ?Code Status: DNR ? ?Signature: ?Rick Duff, MD PGY-2 ?Internal Medicine  ?Pager [phone] ? ? ?

## 2021-05-20 ENCOUNTER — Inpatient Hospital Stay (HOSPITAL_COMMUNITY): Payer: Medicare Other

## 2021-05-20 DIAGNOSIS — D696 Thrombocytopenia, unspecified: Secondary | ICD-10-CM

## 2021-05-20 DIAGNOSIS — I4819 Other persistent atrial fibrillation: Secondary | ICD-10-CM

## 2021-05-20 DIAGNOSIS — N1831 Chronic kidney disease, stage 3a: Secondary | ICD-10-CM

## 2021-05-20 DIAGNOSIS — I639 Cerebral infarction, unspecified: Secondary | ICD-10-CM | POA: Diagnosis present

## 2021-05-20 LAB — GLUCOSE, CAPILLARY
Glucose-Capillary: 118 mg/dL — ABNORMAL HIGH (ref 70–99)
Glucose-Capillary: 105 mg/dL — ABNORMAL HIGH (ref 70–99)

## 2021-05-20 LAB — BASIC METABOLIC PANEL
Anion gap: 7 (ref 5–15)
BUN: 16 mg/dL (ref 8–23)
CO2: 25 mmol/L (ref 22–32)
Calcium: 9.8 mg/dL (ref 8.9–10.3)
Chloride: 104 mmol/L (ref 98–111)
Creatinine, Ser: 1.01 mg/dL — ABNORMAL HIGH (ref 0.44–1.00)
GFR, Estimated: 56 mL/min — ABNORMAL LOW (ref >60)
Glucose, Bld: 110 mg/dL — ABNORMAL HIGH (ref 70–99)
Potassium: 4.2 mmol/L (ref 3.5–5.1)
Sodium: 136 mmol/L (ref 135–145)

## 2021-05-20 NOTE — TOC Progression Note (Signed)
Transition of Care (TOC) - Progression Note  ? ? ?Patient Details  ?Name: Nicole Mercado ?MRN: [id number] ?Date of Birth: 1940/08/24 ? ?Transition of Care (TOC) CM/SW Contact  ?Geralynn Ochs, LCSW ?Phone Number: ?05/20/2021, 4:08 PM ? ?Clinical Narrative:   CSW contacted [facility] to review referral, they can offer a bed. CSW updated Navi with SNF choice, and [facility] was [facility]. CSW updated son, [name]. Patient to admit to [facility] tomorrow pending medical stability.  ? ? ? ?Expected Discharge Plan: [facility] ?Barriers to Discharge: Continued Medical Work up, SNF Pending bed offer, Insurance Authorization ? ?Expected Discharge Plan and Services ?Expected Discharge Plan: [place] ?  ?  ?  ?Living arrangements for the past 2 months: Apartment ?                ?  ?  ?  ?  ?  ?  ?  ?  ?  ?  ? ? ?Social Determinants of Health (SDOH) Interventions ?  ? ?Readmission Risk Interventions ?   ? View : No data to display.  ?  ?  ?  ? ? ?

## 2021-05-20 NOTE — Progress Notes (Signed)
OT Cancellation Note ? ?Patient Details ?Name: Nicole Mercado ?MRN: [id number] ?DOB: [birth year] ? ? ?Cancelled Treatment:    Reason Eval/Treat Not Completed: Patient at procedure or test/ unavailable (pt being taken for swallow study, will reattempt as schedule permits.) ? ?  ?Lynnda Child, OTD, OTR/L ?Acute Rehab ?[phone] ? ? ?Kaylyn Lim ?05/20/2021, 8:39 AM ?

## 2021-05-20 NOTE — Progress Notes (Signed)
?  HD#3 ?SUBJECTIVE:  ?Patient Summary:  ?Nicole Mercado is an 81 year old female with a PMHx of atrial fibrillation on Eliquis, aortic stenosis s/p prosthetic valve, CAD s/p CABG, carotid artery disease s/p CEA, TIA in 2022, PAD, hyperlipidemia, CLL, and chronic thrombocytopenia. She presented to the emergency department as a code stroke for aphasia and L sided facial droop. Symptoms appear to have resolved.  ?  ?Overnight Events:  ?NAEON ?  ?Interim History:  ?Pt lying comfortably in bed this morning. States that she feels good overall. She is alert and oriented to location and is amenable to SNF placement, especially so if it is [facility]. ?  ?OBJECTIVE:  ?Vital Signs: ? ?Vitals:  ? 05/20/21 0736 05/20/21 1119  ?BP: (!) 128/59 90/61  ?Pulse: 72 (!) 103  ?Resp: 16 16  ?Temp: 98.2 ?F (36.8 ?C)   ?SpO2: 100% 100%  ? ? ?  ?Physical Exam: ?Constitutional: Appears well-developed and well-nourished. No distress.  ?HENT: Normocephalic and atraumatic, EOMI, conjunctiva normal, moist mucous membranes ?Cardiovascular: Normal rate, regular rhythm, S1 and S2 present, no murmurs, rubs, gallops.  Distal pulses intact. ?Respiratory: Effort is normal on room air. CTAB. ?Abdominal: NTTP, +BS ?Musculoskeletal: Normal bulk and tone.  No peripheral edema noted. ?Skin: Warm and dry.  No rash, erythema, lesions noted. ?Neurological: Alert and oriented x4, no apparent focal deficits noted.  ?Psychiatric: Normal mood and affect. Behavior is normal.   ?  ?  ?ASSESSMENT/PLAN:  ?Assessment: ?Nicole Mercado is an 81 year old female with a PMHx of atrial fibrillation on Eliquis, aortic stenosis s/p prosthetic valve, CAD s/p CABG, carotid artery disease s/p CEA, TIA in 2022, PAD, hyperlipidemia, CLL, and chronic thrombocytopenia. She presented to the emergency department as a code stroke for aphasia and L sided facial droop. Symptoms appear to have resolved and MRI is negative. Likely TIA.  ?  ?  ?Plan: ?  ?#TIA ?Initial NIHSS was 6 (scoring 2 for  LOC questions, 1 for facial palsy, 1 for LLE weakness, 2 for language). CTH negative. CTA with no LVO. No TNK given out of window and on Eliquis. On my exam with IMTS several hours later, NIHSS of 0 and patient was without any signs of aphasia or dysarthria. MRI came back with no acute stroke. Presumed TIA, but will wait for stroke team assessment and recs.   ?-F/u stroke recs ?-Continue home Eliquis at reduced dose of 2.5 mg BID ?-Hold home ASA ?-Continue home Lipitor 10 mg ?-Permissive HTN x 48 hrs (BP <220/110), PRN Labetalol ?-q4 Neurochecks ?-PT/OT/SLP ?-Dys 3 diet ?-Ambulatory stroke referral on d/c ?  ?#Paroxysmal Atrial fibrillation ?Had a CV on 5/3 that was successful. Found to be in RVR on arrival. Given Cardizem 20 mg IV followed by gtt with rates controlled around 100-110. Transitioned to her home regimen 5/12 with HR in 70's-90's.  ?-Continue Cardizem CD 180 mg daily and Lopressor 100 mg BID ?-Telemetry ?  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 Currently at baseline. ?-Daily BMP ?-Avoid nephrotoxins  ?  ?#Chronic thrombocytopenia ?Plts at 128, appears to be bl.  ?  ?FEN/GI: Dys 3 diet ?VTE: Eliquis ?Dispo: TBD ?Code Status: DNR ?Medically stable for D/C, awaiting SNF placement ?  ?Signature: ?Corky Sox, MD ?PGY-1 ?Pager: [phone] ?Please contact the on call pager after 5 pm and on weekends at [phone].  ?

## 2021-05-20 NOTE — Progress Notes (Signed)
Occupational Therapy Treatment ?Patient Details ?Name: Nicole Mercado ?MRN: [id number] ?DOB: 24-Apr-1940 ?Today's Date: 05/20/2021 ? ? ?History of present illness 81 y.o. female was brought to hosp 5/11 demonstrating L side weakness and facial droop, confusion, aphasia and had negative MRI.  EEG done earlier, no findings.  Had a TIA type event during this PT eval and slumped over in BR from standing, gradual loss of alertness and speech.  Nursing assessed, pt became fully responsive with no drops in BP, sats or HR issues.  Pt is poor historian, slow to follow directions.  PMH: CAD status post CABG and bioprosthetic aortic valve replacement, history of CLL, A-fib with RVR, syncopal episode. ?  ?OT comments ? Pt progressing towards goals, initially with soft BP sitting EOB (see vitals flowsheet); WNL after chair transfer. Pt able to complete pericare in standing position and stand pivot transfer to chair with minA. Pt with posterior lean, requiring external support. Pt disoriented to place, time, and situation this session, however following commands with increased time. Pt presenting with impairments listed below, will follow acutely. Continue to recommend SNF at d/c.  ? ?Recommendations for follow up therapy are one component of a multi-disciplinary discharge planning process, led by the attending physician.  Recommendations may be updated based on patient status, additional functional criteria and insurance authorization. ?   ?Follow Up Recommendations ? Skilled nursing-short term rehab (<3 hours/day)  ?  ?Assistance Recommended at Discharge Frequent or constant Supervision/Assistance  ?Patient can return home with the following ? A little help with walking and/or transfers;A little help with bathing/dressing/bathroom;Assistance with cooking/housework;Assist for transportation;Help with stairs or ramp for entrance;Direct supervision/assist for medications management;Direct supervision/assist for financial management ?   ?Equipment Recommendations ? Other (comment) (RW)  ?  ?Recommendations for Other Services   ? ?  ?Precautions / Restrictions Precautions ?Precautions: Fall ?Precaution Comments: hx of falls with L side weakness ?Restrictions ?Weight Bearing Restrictions: No  ? ? ?  ? ?Mobility Bed Mobility ?Overal bed mobility: Needs Assistance ?Bed Mobility: Supine to Sit, Sit to Supine ?  ?  ?Supine to sit: Min guard ?Sit to supine: Min guard ?  ?General bed mobility comments: increased time/cuing ?  ? ?Transfers ?Overall transfer level: Needs assistance ?Equipment used: 1 person hand held assist ?Transfers: Sit to/from Stand ?Sit to Stand: Min assist ?  ?  ?  ?  ?  ?  ?  ?  ?Balance Overall balance assessment: Needs assistance, History of Falls ?Sitting-balance support: Feet supported, Single extremity supported ?Sitting balance-Leahy Scale: Fair ?  ?Postural control: Posterior lean ?Standing balance support: Bilateral upper extremity supported ?Standing balance-Leahy Scale: Poor ?Standing balance comment: posterior lean against bed in standing ?  ?  ?  ?  ?  ?  ?  ?  ?  ?  ?  ?   ? ?ADL either performed or assessed with clinical judgement  ? ?ADL Overall ADL's : Needs assistance/impaired ?  ?  ?  ?  ?  ?  ?  ?  ?  ?  ?  ?  ?Toilet Transfer: Minimal assistance;Stand-pivot;BSC/3in1;Ambulation ?Toilet Transfer Details (indicate cue type and reason): simulated in room ?Toileting- Clothing Manipulation and Hygiene: Sit to/from stand;Minimal assistance ?Toileting - Clothing Manipulation Details (indicate cue type and reason): for pericare ?  ?  ?Functional mobility during ADLs: Minimal assistance ?  ?  ? ?Extremity/Trunk Assessment Upper Extremity Assessment ?Upper Extremity Assessment: Overall WFL for tasks assessed ?  ?Lower Extremity Assessment ?Lower Extremity Assessment: Defer  to PT evaluation ?  ?  ?  ? ?Vision   ?Vision Assessment?: No apparent visual deficits ?  ?Perception   ?  ?Praxis   ?  ? ?Cognition Arousal/Alertness:  Awake/alert ?Behavior During Therapy: [facility] for tasks assessed/performed ?Overall Cognitive Status: Impaired/Different from baseline ?Area of Impairment: Orientation, Attention, Memory, Following commands, Safety/judgement, Awareness, Problem solving ?  ?  ?  ?  ?  ?  ?  ?  ?Orientation Level: Disoriented to, Place, Time, Situation ?  ?  ?Following Commands: Follows one step commands with increased time ?Safety/Judgement: Decreased awareness of deficits, Decreased awareness of safety ?Awareness: Intellectual, Emergent ?  ?General Comments: able to recall swallow eval earlier this AM, aware that her bed is wet/needs to be changed ?  ?  ?   ?Exercises   ? ?  ?Shoulder Instructions   ? ? ?  ?General Comments VSS on RA; initial soft BP with sitting EOB, returned WNL after chair transfer  ? ? ?Pertinent Vitals/ Pain       Pain Assessment ?Pain Assessment: No/denies pain ? ?Home Living   ?  ?  ?  ?  ?  ?  ?  ?  ?  ?  ?  ?  ?  ?  ?  ?  ?  ?  ? ?  ?Prior Functioning/Environment    ?  ?  ?  ?   ? ?Frequency ? Min 2X/week  ? ? ? ? ?  ?Progress Toward Goals ? ?OT Goals(current goals can now be found in the care plan section) ? Progress towards OT goals: Progressing toward goals ? ?Acute Rehab OT Goals ?Patient Stated Goal: none stated ?OT Goal Formulation: With patient ?Time For Goal Achievement: 05/31/21 ?Potential to Achieve Goals: Good ?ADL Goals ?Pt Will Perform Grooming: Independently;standing ?Pt Will Perform Lower Body Bathing: with modified independence;sitting/lateral leans;sit to/from stand ?Pt Will Perform Lower Body Dressing: with modified independence;sitting/lateral leans;sit to/from stand ?Pt Will Transfer to Toilet: with modified independence;ambulating ?Pt Will Perform Toileting - Clothing Manipulation and hygiene: with modified independence;sitting/lateral leans;sit to/from stand ?Additional ADL Goal #1: Pt will complete 3 step directional task to improve safety during ADL's. ?Additional ADL Goal #2: pt will  demonstrated improved activty tolerance to complete at least 3 grooming tasks in standing with supervision A  ?Plan Discharge plan remains appropriate;Frequency remains appropriate   ? ?Co-evaluation ? ? ?   ?  ?  ?  ?  ? ?  ?AM-PAC OT "6 Clicks" Daily Activity     ?Outcome Measure ? ? Help from another person eating meals?: None ?Help from another person taking care of personal grooming?: A Little ?Help from another person toileting, which includes using toliet, bedpan, or urinal?: A Little ?Help from another person bathing (including washing, rinsing, drying)?: A Little ?Help from another person to put on and taking off regular upper body clothing?: A Little ?Help from another person to put on and taking off regular lower body clothing?: A Little ?6 Click Score: 19 ? ?  ?End of Session Equipment Utilized During Treatment: Gait belt ? ?OT Visit Diagnosis: Unsteadiness on feet (R26.81);Other abnormalities of gait and mobility (R26.89);Muscle weakness (generalized) (M62.81);Other symptoms and signs involving cognitive function ?  ?Activity Tolerance Patient tolerated treatment well ?  ?Patient Left in chair;with call bell/phone within reach;with chair alarm set ?  ?Nurse Communication Mobility status ?  ? ?   ? ?Time: 9449-6759 ?OT Time Calculation (min): 26 min ? ?Charges: OT General Charges ?$  OT Visit: 1 Visit ?OT Treatments ?$Self Care/Home Management : 23-37 mins ? ?Lynnda Child, OTD, OTR/L ?Acute Rehab ?[phone] ? ? ?Kaylyn Lim ?05/20/2021, 12:17 PM ?

## 2021-05-20 NOTE — Progress Notes (Signed)
Physical Therapy Treatment ?Patient Details ?Name: Nicole Mercado ?MRN: [id number] ?DOB: 01-May-1940 ?Today's Date: 05/20/2021 ? ? ?History of Present Illness 81 y.o. female was brought to hosp 5/11 demonstrating L side weakness and facial droop, confusion, aphasia and had negative MRI.  EEG done earlier, no findings.  Had a TIA type event during this PT eval and slumped over in BR from standing, gradual loss of alertness and speech.  Nursing assessed, pt became fully responsive with no drops in BP, sats or HR issues.  Pt is poor historian, slow to follow directions.  PMH: CAD status post CABG and bioprosthetic aortic valve replacement, history of CLL, A-fib with RVR, syncopal episode. ? ?  ?PT Comments  ? ? Pt admitted with above diagnosis. Pt was soiled with BM on arrival.  Cleaned pt and pt stood at EOB for a few minutes.  Pt expressed fatigue and didn't want to get in chair.  She is frustrated by incr stools.  Will follow acutely. Pt currently with functional limitations due to balance and endurance deficits. Pt will benefit from skilled PT to increase their independence and safety with mobility to allow discharge to the venue listed below.      ?Recommendations for follow up therapy are one component of a multi-disciplinary discharge planning process, led by the attending physician.  Recommendations may be updated based on patient status, additional functional criteria and insurance authorization. ? ?Follow Up Recommendations ? Skilled nursing-short term rehab (<3 hours/day) ?  ?  ?Assistance Recommended at Discharge Frequent or constant Supervision/Assistance  ?Patient can return home with the following A little help with walking and/or transfers;A little help with bathing/dressing/bathroom;Assistance with cooking/housework;Direct supervision/assist for medications management;Direct supervision/assist for financial management;Assist for transportation;Help with stairs or ramp for entrance ?  ?Equipment  Recommendations ? None recommended by PT  ?  ?Recommendations for Other Services   ? ? ?  ?Precautions / Restrictions Precautions ?Precautions: Fall ?Precaution Comments: hx of falls with L side weakness ?Restrictions ?Weight Bearing Restrictions: No  ?  ? ?Mobility ? Bed Mobility ?Overal bed mobility: Needs Assistance ?Bed Mobility: Supine to Sit, Sit to Supine ?  ?  ?Supine to sit: Min guard ?Sit to supine: Min guard ?  ?General bed mobility comments: Noted pt had BM therefore spend time cleaning pt intiailly with pt rolling multiple times. Had to change pts sheets she was so soiled.  Pt needed increased time/cuing to come to EOB. ?  ? ?Transfers ?Overall transfer level: Needs assistance ?Equipment used: 1 person hand held assist ?Transfers: Sit to/from Stand ?Sit to Stand: Min assist ?  ?  ?  ?  ?  ?General transfer comment: min assist to power up.  Took side steps up to [facility].  Marched in place.  Pt reported fatigue and did not want to walk or get in chair. Pt frustrated by frequent stools. ?  ? ?Ambulation/Gait ?  ?  ?  ?  ?  ?  ?  ?  ? ? ?Stairs ?  ?  ?  ?  ?  ? ? ?Wheelchair Mobility ?  ? ?Modified Rankin (Stroke Patients Only) ?Modified Rankin (Stroke Patients Only) ?Pre-Morbid Rankin Score: Slight disability ?Modified Rankin: Moderate disability ? ? ?  ?Balance Overall balance assessment: Needs assistance, History of Falls ?Sitting-balance support: Feet supported, Single extremity supported ?Sitting balance-Leahy Scale: Fair ?Sitting balance - Comments: requires close guard as at times she lets go and loses balance backward ?Postural control: Posterior lean ?Standing balance support: Bilateral  upper extremity supported ?Standing balance-Leahy Scale: Poor ?Standing balance comment: posterior lean against bed in standing ?  ?  ?  ?  ?  ?  ?  ?  ?  ?  ?  ?  ? ?  ?Cognition Arousal/Alertness: Awake/alert ?Behavior During Therapy: [facility] for tasks assessed/performed ?Overall Cognitive Status: Impaired/Different from  baseline ?Area of Impairment: Orientation, Attention, Memory, Following commands, Safety/judgement, Awareness, Problem solving ?  ?  ?  ?  ?  ?  ?  ?  ?Orientation Level: Disoriented to, Place, Time, Situation ?  ?  ?Following Commands: Follows one step commands with increased time ?Safety/Judgement: Decreased awareness of deficits, Decreased awareness of safety ?Awareness: Intellectual, Emergent ?  ?General Comments: able to recall swallow eval earlier this AM, aware that her bed is wet/needs to be changed ?  ?  ? ?  ?Exercises   ? ?  ?General Comments   ?  ?  ? ?Pertinent Vitals/Pain Pain Assessment ?Pain Assessment: No/denies pain  ? ? ?Home Living   ?  ?  ?  ?  ?  ?  ?  ?  ?  ?   ?  ?Prior Function    ?  ?  ?   ? ?PT Goals (current goals can now be found in the care plan section) Acute Rehab PT Goals ?Patient Stated Goal: none stated ?Progress towards PT goals: Progressing toward goals ? ?  ?Frequency ? ? ? Min 3X/week ? ? ? ?  ?PT Plan Current plan remains appropriate  ? ? ?Co-evaluation   ?  ?  ?  ?  ? ?  ?AM-PAC PT "6 Clicks" Mobility   ?Outcome Measure ? Help needed turning from your back to your side while in a flat bed without using bedrails?: A Little ?Help needed moving from lying on your back to sitting on the side of a flat bed without using bedrails?: A Little ?Help needed moving to and from a bed to a chair (including a wheelchair)?: A Little ?Help needed standing up from a chair using your arms (e.g., wheelchair or bedside chair)?: A Little ?Help needed to walk in hospital room?: A Little ?Help needed climbing 3-5 steps with a railing? : A Lot ?6 Click Score: 17 ? ?  ?End of Session Equipment Utilized During Treatment: Gait belt ?Activity Tolerance: Patient limited by fatigue;Patient tolerated treatment well ?Patient left: in bed;with call bell/phone within reach;with bed alarm set;with nursing/sitter in room;Other (comment) Oncologist) ?Nurse Communication: Mobility status ?PT Visit Diagnosis:  Unsteadiness on feet (R26.81);Muscle weakness (generalized) (M62.81);Repeated falls (R29.6);Difficulty in walking, not elsewhere classified (R26.2);Hemiplegia and hemiparesis ?Hemiplegia - Right/Left: Left ?Hemiplegia - dominant/non-dominant: Non-dominant ?Hemiplegia - caused by: Unspecified ?  ? ? ?Time: 3546-5681 ?PT Time Calculation (min) (ACUTE ONLY): 26 min ? ?Charges:  $Therapeutic Activity: 8-22 mins ?$Self Care/Home Management: 8-22          ?          ? ?Cotey Rakes M,PT ?Acute Rehab Services ?[phone] ?[phone] (pager)  ? ? ?Dustyn Dansereau F Kaena Santori ?05/20/2021, 4:32 PM ? ?

## 2021-05-20 NOTE — Progress Notes (Signed)
Modified Barium Swallow Progress Note ? ?Patient Details  ?Name: Nicole Mercado ?MRN: [id number] ?Date of Birth: 06/29/1940 ? ?Today's Date: 05/20/2021 ? ?Modified Barium Swallow completed.  Full report located under Chart Review in the Imaging Section. ? ?Brief recommendations include the following: ? ?Clinical Impression ? Pt demosntrates a mild oropharyngeal dysphagia with oral rocking and holding of bolus; lingual residue and spillage of lingual residue to vallculae post swallow. There was moderate vallecular residue with thins and solids on initial swallow and accumulating after swallow. A chin tuck is beneficial in increasing base of tongue propulsion and epiglottic deflection to clear residue. Pt had one instance of very trace sensed aspiration of thin liquids before the swallow. Problem did not recur after significantly rapid, large volume trials. Recommend pt continue a dys 3 diet with thin liquids with training for a chin tuck in this and next level of care. ?  ?Swallow Evaluation Recommendations ? ?   ? ? SLP Diet Recommendations: Dysphagia 3 (Mech soft) solids;Thin liquid ? ? Liquid Administration via: Cup;Straw ? ? Medication Administration: Whole meds with puree ? ? Supervision: Patient able to self feed ? ? Compensations: Chin tuck ? ? Postural Changes: Remain semi-upright after after feeds/meals (Comment) ? ? Oral Care Recommendations: Oral care BID ? ?   ? ? ? ?Liliahna Cudd, Katherene Ponto ?05/20/2021,12:04 PM ?

## 2021-05-21 LAB — BASIC METABOLIC PANEL
Anion gap: 8 (ref 5–15)
BUN: 17 mg/dL (ref 8–23)
CO2: 24 mmol/L (ref 22–32)
Calcium: 9.3 mg/dL (ref 8.9–10.3)
Chloride: 101 mmol/L (ref 98–111)
Creatinine, Ser: 0.95 mg/dL (ref 0.44–1.00)
GFR, Estimated: >60 mL/min (ref >60)
Glucose, Bld: 104 mg/dL — ABNORMAL HIGH (ref 70–99)
Potassium: 3.7 mmol/L (ref 3.5–5.1)
Sodium: 133 mmol/L — ABNORMAL LOW (ref 135–145)

## 2021-05-21 LAB — GLUCOSE, CAPILLARY
Glucose-Capillary: 107 mg/dL — ABNORMAL HIGH (ref 70–99)
Glucose-Capillary: 109 mg/dL — ABNORMAL HIGH (ref 70–99)

## 2021-05-21 MED ORDER — POLYETHYLENE GLYCOL 3350 17 G PO PACK: 17.0000 g | PACK | Freq: Every day | ORAL | 0 refills | Status: AC

## 2021-05-21 MED ORDER — APIXABAN 2.5 MG PO TABS: 2.5000 mg | ORAL_TABLET | Freq: Two times a day (BID) | ORAL | 0 refills | Status: DC

## 2021-05-21 NOTE — Discharge Summary (Signed)
? ?Name: Nicole Mercado ?MRN: [id number] ?DOB: 18-Jul-1940 81 y.o. ?PCP: Kristie Cowman, MD ? ?Date of Admission: 05/16/2021  8:13 AM ?Date of Discharge: 05/21/2021 ?Attending Physician: Campbell Riches, MD ? ?Discharge Diagnosis: ?1. TIA ?2. Atrial Fibrillation ?3. CKD stage 3a ?4. Chronic Thrombocytopenia ? ?Discharge Medications: ?Allergies as of 05/21/2021   ?No Known Allergies ?  ? ?  ?Medication List  ?  ? ?STOP taking these medications   ? ?aspirin 81 MG tablet ?  ?atorvastatin 10 MG tablet ?Commonly known as: LIPITOR ?  ?Calcium Carb-Cholecalciferol 600-10 MG-MCG Caps ?  ?HM MULTIVITAMIN ADULT GUMMY PO ?  ?oxybutynin 10 MG 24 hr tablet ?Commonly known as: DITROPAN-XL ?  ? ?  ? ?TAKE these medications   ? ?acetaminophen 325 MG tablet ?Commonly known as: TYLENOL ?Take 650 mg by mouth every 6 (six) hours as needed for mild pain or headache. ?  ?apixaban 2.5 MG Tabs tablet ?Commonly known as: ELIQUIS ?Take 1 tablet (2.5 mg total) by mouth 2 (two) times daily. ?What changed:  ?medication strength ?how much to take ?  ?diltiazem 180 MG 24 hr capsule ?Commonly known as: CARDIZEM CD ?Take 1 capsule (180 mg total) by mouth daily. ?  ?metoprolol tartrate 100 MG tablet ?Commonly known as: LOPRESSOR ?Take 1 tablet (100 mg total) by mouth 2 (two) times daily. ?  ?polyethylene glycol 17 g packet ?Commonly known as: MIRALAX / GLYCOLAX ?Take 17 g by mouth daily. ?  ? ?  ? ? ?Disposition and follow-up:   ?NicoleLeodis Mercado was discharged from [facility] in Stable condition.  At the hospital follow up visit please address: ? ?1.  Assess for any further stroke symptoms and if atrial fibrillation is appropriately rate controlled ? ?2.  Labs / imaging needed at time of follow-up: chronic thrombocytopenia, Creatinine stage 3a ? ?3.  Pending labs/ test needing follow-up: none ? ?Follow-up Appointments: ?Patient needs to schedule PCP follow up in 1 week for hospital follow up visit ? ?Hospital Course by problem  list: ?1. TIA ?Patient initially presented with aphasia and left-sided facial droop.  Code stroke was called but patient did not require TNK or thrombectomy as she was outside of window and on Eliquis.  Symptoms spontaneously resolved but patient was admitted for stroke work-up.  MRI did not show acute ischemic stroke.  Head/neck CTA showed no acute intracranial pathologies.  EEG was negative which ruled out seizure.  Given patient's compliance with Eliquis for her atrial fibrillation, it was unclear whether this would be considered Eliquis failure given TIA and if patient should be switched to Xarelto.  However, decision was to continue Eliquis at this time. ? ?2. Atrial Fibrillation ?Patient was successfully cardioverted on 5/3.  Patient was found to be RVR on arrival.  Patient was given Cardizem 20 mg IV followed by drip which led to rate control around 100-110.  Patient was transitioned to home regimen with heart rate in 70s to 100s.  Could consider increasing Cardizem should patient's heart rate if patient's HR persistently elevated in 100s. ? ?3. CKD stage 3a ?Patient's creatinine 1.2 on admission.  Creatinine is now 0.95 on discharge. ? ?4. Chronic Thrombocytopenia ?Patient's platelets have been stable in the 120s-140s ? ?Discharge Exam:   ?BP 121/71 (BP Location: Left Arm)   Pulse (!) 41   Temp 98.2 ?F (36.8 ?C)   Resp 18   Ht 5' (1.524 m)   Wt 44.3 kg   SpO2 100%   BMI  19.07 kg/m?  ?Discharge exam:  ? ? ?Pertinent Labs, Studies, and Procedures:  ?Constitutional: Appears well-developed and well-nourished. No distress.  ?HENT: Normocephalic and atraumatic, EOMI, conjunctiva normal, moist mucous membranes ?Cardiovascular: Normal rate, irregular rhythm, S1 and S2 present.  Distal pulses intact. ?Respiratory: Effort is normal on room air. CTAB. ?Abdominal: NTTP, +BS ?Musculoskeletal: Normal bulk and tone.  No peripheral edema noted. ?Skin: Warm and dry.  No rash, erythema, lesions noted. ?Neurological:  Alert and oriented to self, location, and context but not to time, no apparent focal deficits noted. ?Psychiatric: Normal mood and affect. Behavior is normal.   ? ?Discharge Instructions: ?Discharge Instructions   ? ? Call MD for:  difficulty breathing, headache or visual disturbances   Complete by: As directed ?  ? Call MD for:  persistant nausea and vomiting   Complete by: As directed ?  ? Call MD for:  redness, tenderness, or signs of infection (pain, swelling, redness, odor or green/yellow discharge around incision site)   Complete by: As directed ?  ? Call MD for:  severe uncontrolled pain   Complete by: As directed ?  ? Call MD for:  temperature >100.4   Complete by: As directed ?  ? Diet full liquid   Complete by: As directed ?  ? Discharge instructions   Complete by: As directed ?  ? Dear Nicole Mercado, ? ?It was a pleasure taking care of you while you were in the hospital. You were admitted for a TIA. You will be transferred to SNF today. Please take your medications as prescribed and follow up with your PCP in approximately 1 week for a hospital follow up.  ? ?Take Care!  ?[facility] Teaching Service  ? Increase activity slowly   Complete by: As directed ?  ? ?  ? ? ?Signed: ?France Ravens, MD ?05/21/2021, 10:19 AM   ?Pager: [phone] ?

## 2021-05-21 NOTE — Care Plan (Signed)
Contacted by primary team in regards to some questions patient's son, Nicole Mercado, had about her cognition. Case was reviewed, including imaging, in particular the severe white matter disease and atrophy  noted on MRI brain. B12, folate, and TSH were checked and WNL. Nicole Mercado states his mother is "basically back to how she was" prior to admission; however, also states that she had previously been able to drive herself tot he store to grocery shop without issue and that eh does not feel she would be able to at this time.  ?Author made clear that the patient is not to be driving until re-evaluated in neurology clinic and that it is possible she will not drive again. He is aware of the eventual need for higher level of care, as the patient lives alone, but would like to continue to evaluate progress in acute rehabilitation facility. The diagnosis of at least Mild Cognitive Impairment was discussed in detail and all questions answered.  ? ?Lennie Hummer, PA-C ?Neurology ? ?

## 2021-05-21 NOTE — Plan of Care (Signed)

## 2021-05-21 NOTE — TOC Transition Note (Signed)
Transition of Care (TOC) - CM/SW Discharge Note ? ? ?Patient Details  ?Name: Nicole Mercado ?MRN: [id number] ?Date of Birth: 02-05-1940 ? ?Transition of Care [facility]) CM/SW Contact:  ?Geralynn Ochs, LCSW ?Phone Number: ?05/21/2021, 11:23 AM ? ? ?Clinical Narrative:   CSW confirmed that bed was available at [facility] and sent discharge information. Transport scheduled with [facility], and son [name] notified. ? ?Nurse to call report to [phone]. ? ? ? ?Final next level of care: [place] ?Barriers to Discharge: Barriers Resolved ? ? ?Patient Goals and CMS Choice ?  ?CMS Medicare.gov Compare Post Acute Care list provided to:: Patient ?Choice offered to / list presented to : [facility] POA / Guardian ? ?Discharge Placement ?  ?           ?Patient [facility] bed at: [facility] ?Patient to be transferred to facility by: [facility] ?Name of family member notified: [name] ?Patient and family notified of of transfer: 05/21/21 ? ?Discharge Plan and Services ?  ?  ?           ?  ?  ?  ?  ?  ?  ?  ?  ?  ?  ? ?Social Determinants of Health (SDOH) Interventions ?  ? ? ?Readmission Risk Interventions ?   ? View : No data to display.  ?  ?  ?  ? ? ? ? ? ?

## 2021-06-12 ENCOUNTER — Emergency Department (HOSPITAL_COMMUNITY): Payer: Medicare Other

## 2021-06-12 ENCOUNTER — Encounter (HOSPITAL_COMMUNITY): Payer: Self-pay | Admitting: Emergency Medicine

## 2021-06-12 ENCOUNTER — Inpatient Hospital Stay (HOSPITAL_COMMUNITY)
Admission: EM | Admit: 2021-06-12 | Discharge: 2021-06-20 | DRG: 987 | Disposition: A | Discharge status: Skilled Nursing Facility | Payer: Medicare Other | Source: Skilled Nursing Facility | Attending: Internal Medicine | Admitting: Internal Medicine

## 2021-06-12 DIAGNOSIS — Z951 Presence of aortocoronary bypass graft: Secondary | ICD-10-CM

## 2021-06-12 DIAGNOSIS — L03116 Cellulitis of left lower limb: Secondary | ICD-10-CM

## 2021-06-12 DIAGNOSIS — I2510 Atherosclerotic heart disease of native coronary artery without angina pectoris: Secondary | ICD-10-CM | POA: Diagnosis present

## 2021-06-12 DIAGNOSIS — I639 Cerebral infarction, unspecified: Secondary | ICD-10-CM | POA: Diagnosis not present

## 2021-06-12 DIAGNOSIS — I6340 Cerebral infarction due to embolism of unspecified cerebral artery: Principal | ICD-10-CM | POA: Diagnosis present

## 2021-06-12 DIAGNOSIS — D696 Thrombocytopenia, unspecified: Secondary | ICD-10-CM | POA: Diagnosis present

## 2021-06-12 DIAGNOSIS — N1831 Chronic kidney disease, stage 3a: Secondary | ICD-10-CM | POA: Diagnosis present

## 2021-06-12 DIAGNOSIS — C9110 Chronic lymphocytic leukemia of B-cell type not having achieved remission: Secondary | ICD-10-CM | POA: Diagnosis present

## 2021-06-12 DIAGNOSIS — Z8673 Personal history of transient ischemic attack (TIA), and cerebral infarction without residual deficits: Secondary | ICD-10-CM

## 2021-06-12 DIAGNOSIS — I129 Hypertensive chronic kidney disease with stage 1 through stage 4 chronic kidney disease, or unspecified chronic kidney disease: Secondary | ICD-10-CM | POA: Diagnosis present

## 2021-06-12 DIAGNOSIS — F05 Delirium due to known physiological condition: Secondary | ICD-10-CM | POA: Diagnosis not present

## 2021-06-12 DIAGNOSIS — S9032XA Contusion of left foot, initial encounter: Secondary | ICD-10-CM | POA: Diagnosis present

## 2021-06-12 DIAGNOSIS — E43 Unspecified severe protein-calorie malnutrition: Secondary | ICD-10-CM | POA: Diagnosis present

## 2021-06-12 DIAGNOSIS — I739 Peripheral vascular disease, unspecified: Secondary | ICD-10-CM | POA: Diagnosis present

## 2021-06-12 DIAGNOSIS — R1310 Dysphagia, unspecified: Secondary | ICD-10-CM | POA: Diagnosis present

## 2021-06-12 DIAGNOSIS — E876 Hypokalemia: Secondary | ICD-10-CM | POA: Diagnosis present

## 2021-06-12 DIAGNOSIS — I951 Orthostatic hypotension: Secondary | ICD-10-CM | POA: Diagnosis not present

## 2021-06-12 DIAGNOSIS — R29810 Facial weakness: Secondary | ICD-10-CM | POA: Diagnosis present

## 2021-06-12 DIAGNOSIS — I4821 Permanent atrial fibrillation: Secondary | ICD-10-CM | POA: Diagnosis present

## 2021-06-12 DIAGNOSIS — Z953 Presence of xenogenic heart valve: Secondary | ICD-10-CM

## 2021-06-12 DIAGNOSIS — Z8249 Family history of ischemic heart disease and other diseases of the circulatory system: Secondary | ICD-10-CM

## 2021-06-12 DIAGNOSIS — R32 Unspecified urinary incontinence: Secondary | ICD-10-CM | POA: Diagnosis present

## 2021-06-12 DIAGNOSIS — Z9049 Acquired absence of other specified parts of digestive tract: Secondary | ICD-10-CM

## 2021-06-12 DIAGNOSIS — R4701 Aphasia: Secondary | ICD-10-CM | POA: Diagnosis not present

## 2021-06-12 DIAGNOSIS — J90 Pleural effusion, not elsewhere classified: Secondary | ICD-10-CM | POA: Diagnosis present

## 2021-06-12 DIAGNOSIS — Z7901 Long term (current) use of anticoagulants: Secondary | ICD-10-CM

## 2021-06-12 DIAGNOSIS — Z66 Do not resuscitate: Secondary | ICD-10-CM | POA: Diagnosis present

## 2021-06-12 DIAGNOSIS — X58XXXA Exposure to other specified factors, initial encounter: Secondary | ICD-10-CM | POA: Diagnosis present

## 2021-06-12 DIAGNOSIS — I6522 Occlusion and stenosis of left carotid artery: Secondary | ICD-10-CM | POA: Diagnosis present

## 2021-06-12 DIAGNOSIS — Z90710 Acquired absence of both cervix and uterus: Secondary | ICD-10-CM

## 2021-06-12 DIAGNOSIS — Z7401 Bed confinement status: Secondary | ICD-10-CM

## 2021-06-12 DIAGNOSIS — Z681 Body mass index (BMI) 19 or less, adult: Secondary | ICD-10-CM

## 2021-06-12 DIAGNOSIS — R410 Disorientation, unspecified: Secondary | ICD-10-CM

## 2021-06-12 DIAGNOSIS — L02612 Cutaneous abscess of left foot: Secondary | ICD-10-CM | POA: Diagnosis present

## 2021-06-12 DIAGNOSIS — F0390 Unspecified dementia without behavioral disturbance: Secondary | ICD-10-CM | POA: Diagnosis present

## 2021-06-12 DIAGNOSIS — Z79899 Other long term (current) drug therapy: Secondary | ICD-10-CM

## 2021-06-12 DIAGNOSIS — I6622 Occlusion and stenosis of left posterior cerebral artery: Secondary | ICD-10-CM | POA: Diagnosis present

## 2021-06-12 DIAGNOSIS — Z823 Family history of stroke: Secondary | ICD-10-CM

## 2021-06-12 DIAGNOSIS — I700 Atherosclerosis of aorta: Secondary | ICD-10-CM | POA: Diagnosis present

## 2021-06-12 DIAGNOSIS — R29708 NIHSS score 8: Secondary | ICD-10-CM | POA: Diagnosis present

## 2021-06-12 DIAGNOSIS — E785 Hyperlipidemia, unspecified: Secondary | ICD-10-CM | POA: Diagnosis present

## 2021-06-12 DIAGNOSIS — I493 Ventricular premature depolarization: Secondary | ICD-10-CM | POA: Diagnosis present

## 2021-06-12 LAB — I-STAT CHEM 8, ED
BUN: 22 mg/dL (ref 8–23)
Calcium, Ion: 1.13 mmol/L — ABNORMAL LOW (ref 1.15–1.40)
Chloride: 100 mmol/L (ref 98–111)
Creatinine, Ser: 1 mg/dL (ref 0.44–1.00)
Glucose, Bld: 117 mg/dL — ABNORMAL HIGH (ref 70–99)
HCT: 42 % (ref 36.0–46.0)
Hemoglobin: 14.3 g/dL (ref 12.0–15.0)
Potassium: 4.1 mmol/L (ref 3.5–5.1)
Sodium: 135 mmol/L (ref 135–145)
TCO2: 25 mmol/L (ref 22–32)

## 2021-06-12 LAB — CBC
HCT: 41.5 % (ref 36.0–46.0)
Hemoglobin: 13.9 g/dL (ref 12.0–15.0)
MCH: 31.9 pg (ref 26.0–34.0)
MCHC: 33.5 g/dL (ref 30.0–36.0)
MCV: 95.2 fL (ref 80.0–100.0)
Platelets: 115 10*3/uL — ABNORMAL LOW (ref 150–400)
RBC: 4.36 MIL/uL (ref 3.87–5.11)
RDW: 13.1 % (ref 11.5–15.5)
WBC: 7.6 10*3/uL (ref 4.0–10.5)
nRBC: 0 % (ref 0.0–0.2)

## 2021-06-12 LAB — COMPREHENSIVE METABOLIC PANEL
ALT: 18 U/L (ref 0–44)
AST: 27 U/L (ref 15–41)
Albumin: 3.6 g/dL (ref 3.5–5.0)
Alkaline Phosphatase: 72 U/L (ref 38–126)
Anion gap: 11 (ref 5–15)
BUN: 18 mg/dL (ref 8–23)
CO2: 26 mmol/L (ref 22–32)
Calcium: 9.9 mg/dL (ref 8.9–10.3)
Chloride: 100 mmol/L (ref 98–111)
Creatinine, Ser: 1.22 mg/dL — ABNORMAL HIGH (ref 0.44–1.00)
GFR, Estimated: 45 mL/min — ABNORMAL LOW (ref >60)
Glucose, Bld: 125 mg/dL — ABNORMAL HIGH (ref 70–99)
Potassium: 4 mmol/L (ref 3.5–5.1)
Sodium: 137 mmol/L (ref 135–145)
Total Bilirubin: 1.5 mg/dL — ABNORMAL HIGH (ref 0.3–1.2)
Total Protein: 5.8 g/dL — ABNORMAL LOW (ref 6.5–8.1)

## 2021-06-12 LAB — DIFFERENTIAL
Abs Immature Granulocytes: 0.01 10*3/uL (ref 0.00–0.07)
Basophils Absolute: 0 10*3/uL (ref 0.0–0.1)
Basophils Relative: 1 %
Eosinophils Absolute: 0 10*3/uL (ref 0.0–0.5)
Eosinophils Relative: 0 %
Immature Granulocytes: 0 %
Lymphocytes Relative: 35 %
Lymphs Abs: 2.7 10*3/uL (ref 0.7–4.0)
Monocytes Absolute: 0.9 10*3/uL (ref 0.1–1.0)
Monocytes Relative: 11 %
Neutro Abs: 4 10*3/uL (ref 1.7–7.7)
Neutrophils Relative %: 53 %

## 2021-06-12 LAB — CBG MONITORING, ED: Glucose-Capillary: 109 mg/dL — ABNORMAL HIGH (ref 70–99)

## 2021-06-12 LAB — APTT: aPTT: 26 seconds (ref 24–36)

## 2021-06-12 LAB — PROTIME-INR
INR: 1.2 (ref 0.8–1.2)
Prothrombin Time: 15.4 seconds — ABNORMAL HIGH (ref 11.4–15.2)

## 2021-06-12 IMAGING — CT CT HEAD CODE STROKE
4 series · 16 of 47 positions shown, 18 images · non-contrast
Comparison: None Available.

CLINICAL DATA: Code stroke.  Acute neurologic deficit.



[Series 2: head 5.0 st · axial · 0.43mm/px · z∈[-111,-6]mm · 6 of 31 slices shown, 8 images]
[im 5/31  brain]
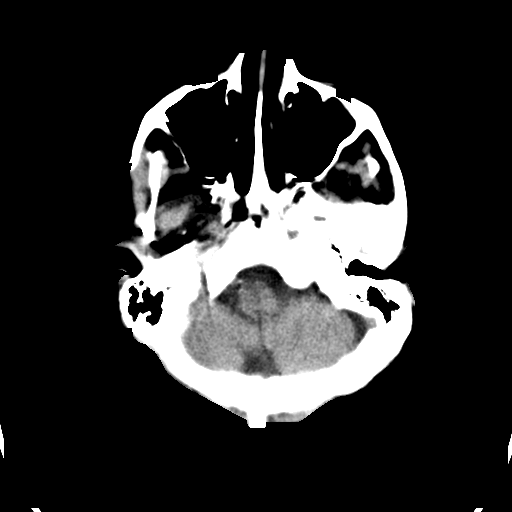
[im 5/31  bone]
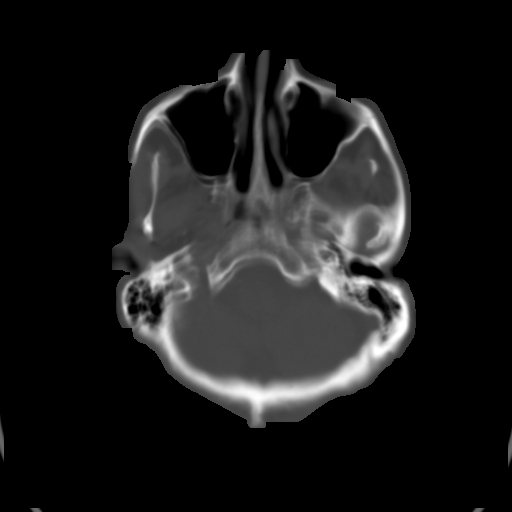
[im 9/31  brain]
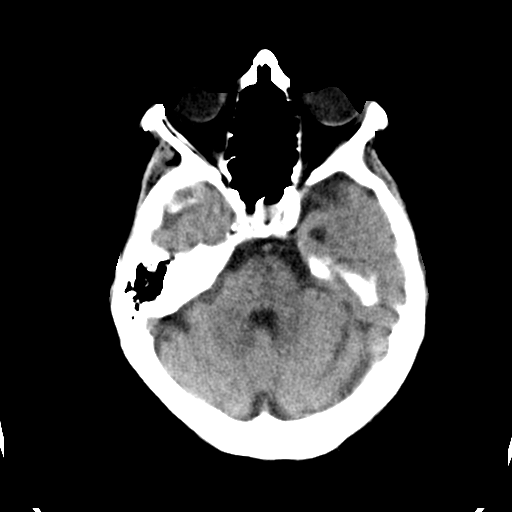
[im 13/31  brain]
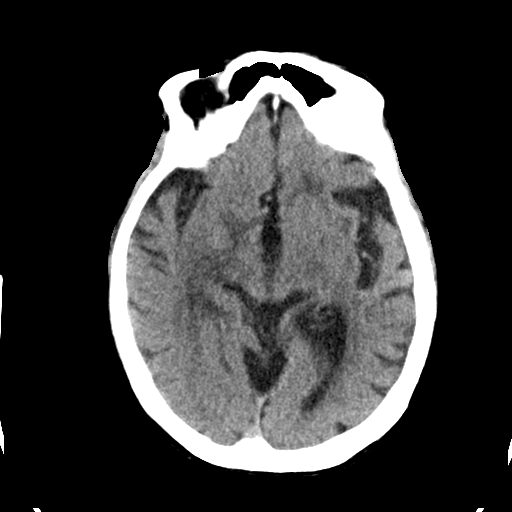
[im 18/31  brain]
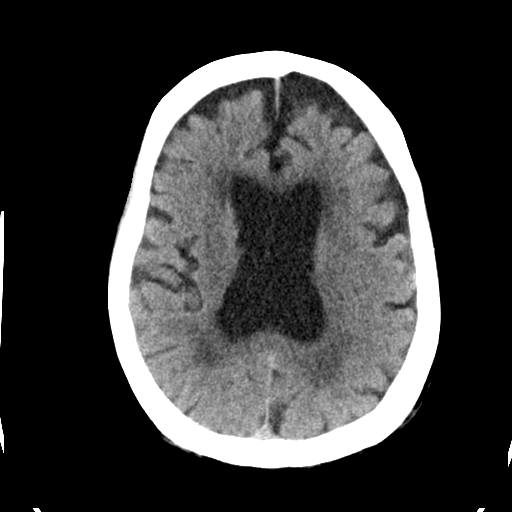
[im 22/31  brain]
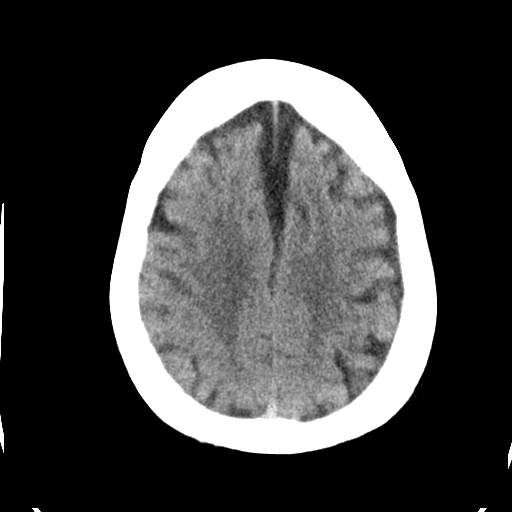
[im 22/31  bone]
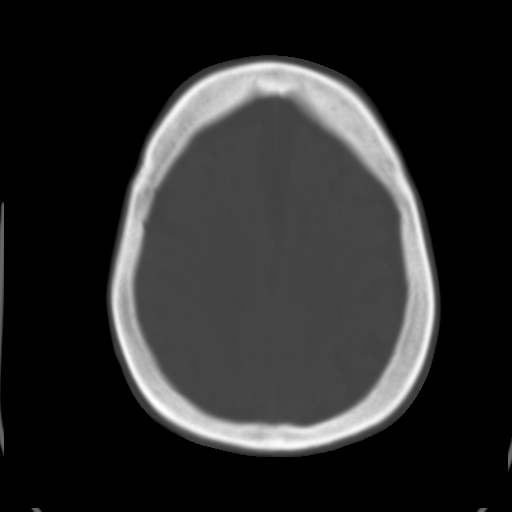
[im 26/31  brain]
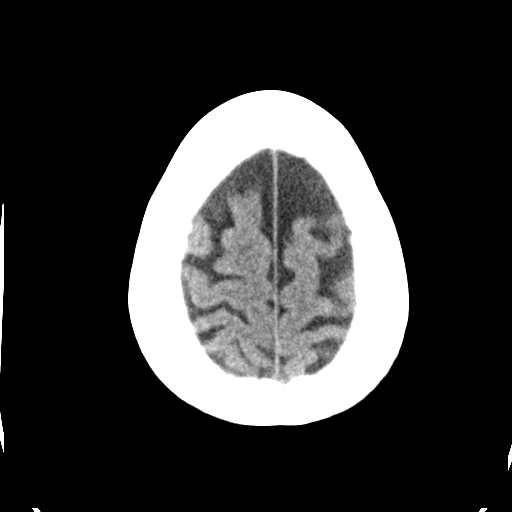

[Series 4: head 2.0 bone · axial · 0.43mm/px · z∈[-117,-65]mm · 4 of 80 slices shown]
[im 8/80  bone]
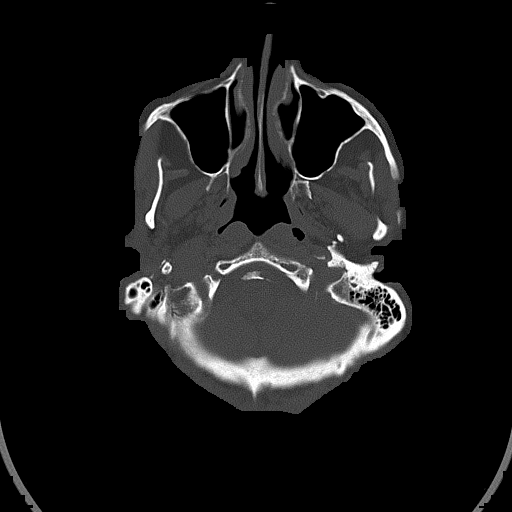
[im 16/80  bone]
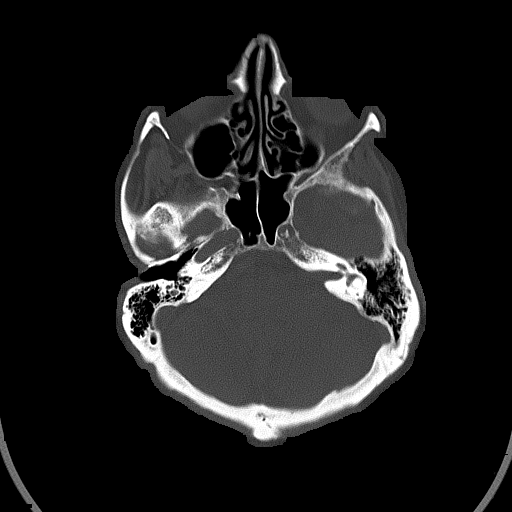
[im 27/80  bone]
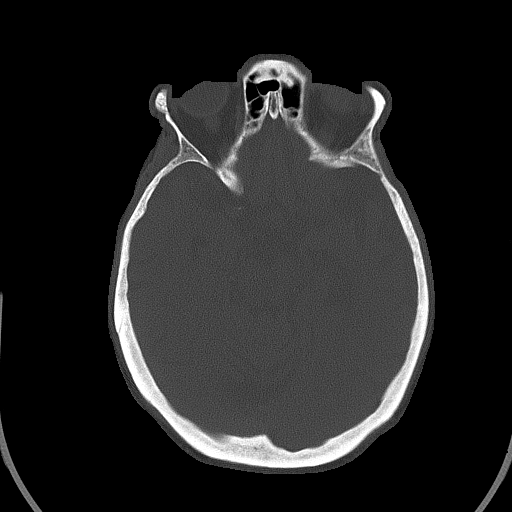
[im 34/80  bone]
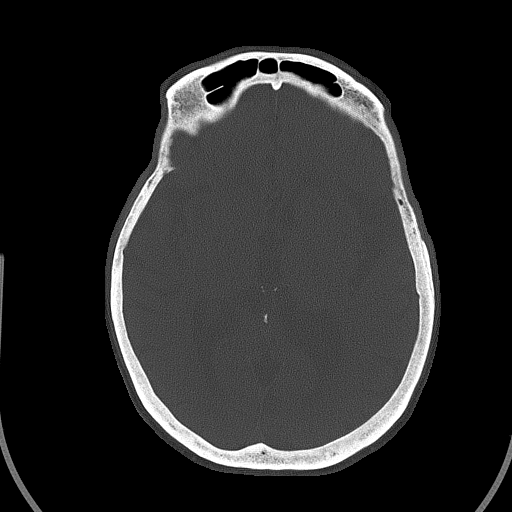

[Series 5: head 3.0 cor st · coronal · 0.32mm/px · 3 of 67 slices shown]
[im 23/67  brain]
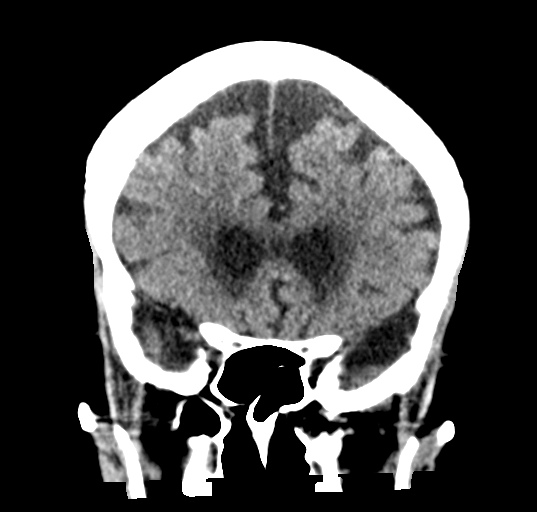
[im 30/67  brain]
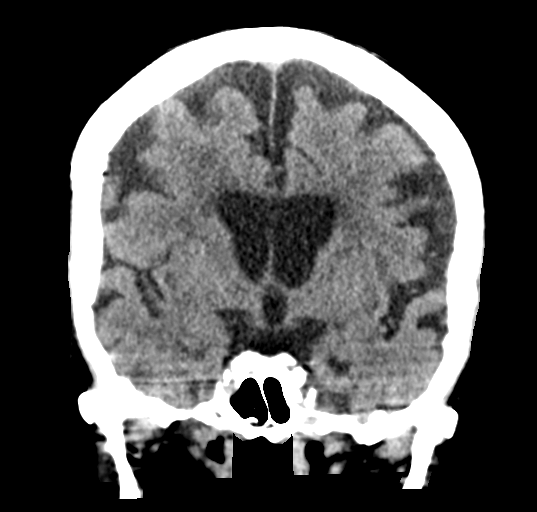
[im 37/67  brain]
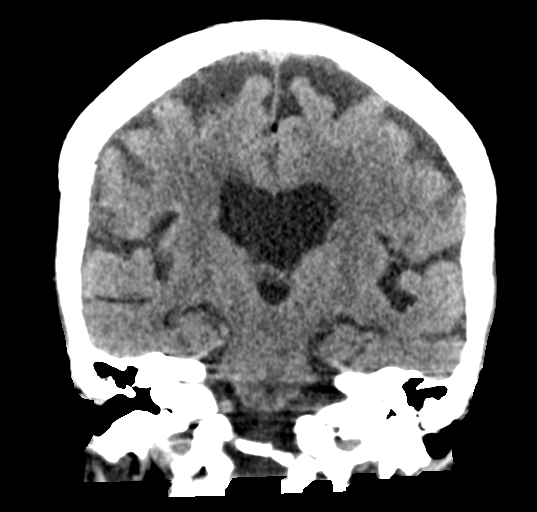

[Series 6: head 3.0 sag st · sagittal · 0.32mm/px · 3 of 56 slices shown]
[im 19/56  brain]
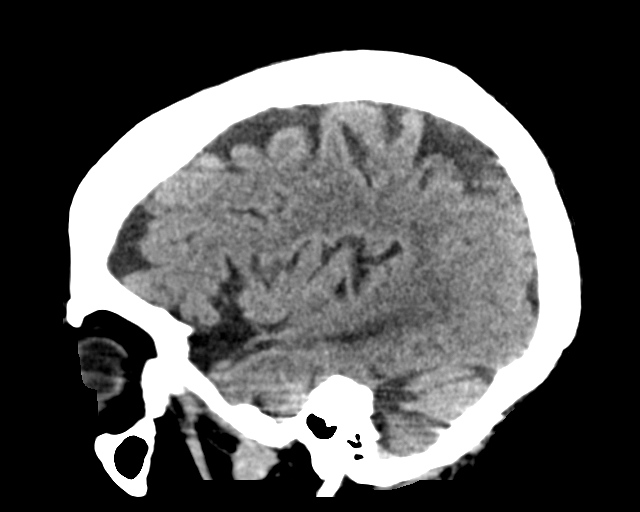
[im 28/56  brain]
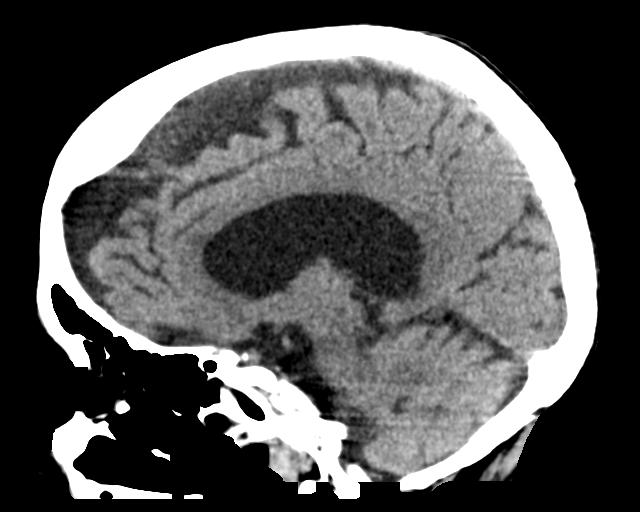
[im 37/56  brain]
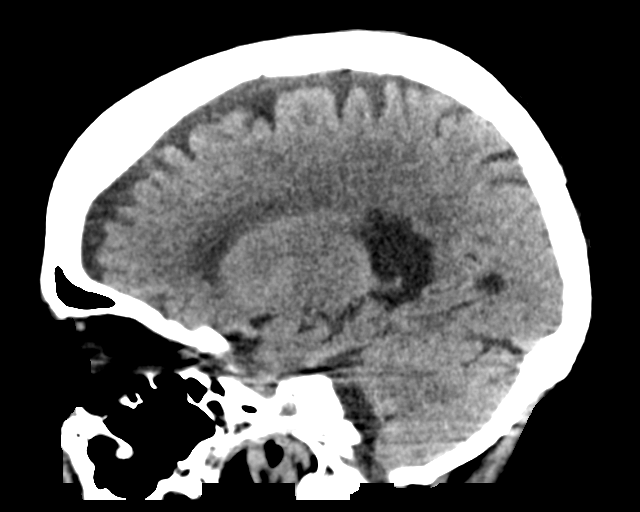

[16 of 47 positions shown; findings below may reference images not displayed]

FINDINGS: Brain: There is no mass, hemorrhage or extra-axial collection. There
is generalized atrophy without lobar predilection. There is
hypoattenuation of the periventricular white matter, most commonly
indicating chronic ischemic microangiopathy. Old right basal ganglia
small vessel infarct.

Vascular: No abnormal hyperdensity of the major intracranial
arteries or dural venous sinuses. No intracranial atherosclerosis.

Skull: The visualized skull base, calvarium and extracranial soft
tissues are normal.

Sinuses/Orbits: No fluid levels or advanced mucosal thickening of
the visualized paranasal sinuses. No mastoid or middle ear effusion.
The orbits are normal.

ASPECTS ([place] Stroke Program Early CT Score)

- Ganglionic level infarction (caudate, lentiform nuclei, internal
capsule, insula, M1-M3 cortex): 7

- Supraganglionic infarction (M4-M6 cortex): 3

Total score (0-10 with 10 being normal): 10
IMPRESSION: 1. No acute intracranial abnormality.
2. ASPECTS is 10.

These results were called by telephone at the time of interpretation
on [DATE] at [DATE] to provider Dr. STRAKOVA, who verbally
acknowledged these results.

## 2021-06-12 MED ORDER — IOHEXOL 350 MG/ML SOLN
100.0000 mL | Freq: Once | INTRAVENOUS | Status: AC | PRN
  Administered 2021-06-12: 100 mL via INTRAVENOUS

## 2021-06-12 MED ORDER — SODIUM CHLORIDE 0.9% FLUSH: 3.0000 mL | Freq: Once | INTRAVENOUS | Status: DC

## 2021-06-12 NOTE — Code Documentation (Signed)
Responded to Code Stroke called at 2238 for aphasia, [id number]. Pt arrived at 2245, CBG-109, NIH-8, CT head negative for acute changes. CTA/CTP-no LVO. TNK not given-outside window, pt on eliquis. VS/neuro q2h x 12, then q4h. Plan MRI.

## 2021-06-12 NOTE — ED Triage Notes (Signed)
Per EMS, LKW 1700 by staff from [facility], found by staff at 2200 w/ her pants off (covered in urine) and incomprehensible speech all unusual for pt.  Facility documentation states she has "cognitive issues".    224/82 CBG 121 HR 70 97% RA

## 2021-06-12 NOTE — ED Provider Notes (Signed)
[facility] EMERGENCY DEPARTMENT Provider Note   CSN: [id number] Arrival date & time: 06/12/21  2245     History  Chief Complaint  Patient presents with   Code Stroke    Nicole Mercado is a 81 y.o. female who presents via EMS for code stroke with presenting symptom of aphasia, LKN at 5 pm today, LVO score 1.  Patient met at the ED bridge by neurologist Dr. Curly Shores, seen in the CT scanner by this provider.  Airway intact.  Patient in no acute distress.  Pending CT imaging, however initial suspicion by neurologist that this may be a hypertensive encephalopathy.  Patient with similar presentations in 5/23 with left facial droop and aphasia, diagnosed with TIA; she does have many risk factors making her high risk for CVA.  Level 5 caveat due to acuity of presentation upon arrival.  I personally reviewed this patient's medical records.  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  Patient is not a TNK candidate secondary to anticoagulation with Eliquis.  HPI     Home Medications Prior to Admission medications   Medication Sig Start Date End Date Taking? Authorizing Provider  acetaminophen (TYLENOL) 325 MG tablet Take 650 mg by mouth every 6 (six) hours as needed for mild pain or headache.    Yes [provider]  apixaban (ELIQUIS) 2.5 MG TABS tablet Take 1 tablet (2.5 mg total) by mouth 2 (two) times daily. 05/21/21 06/20/21 Yes France Ravens, MD  diltiazem (CARDIZEM CD) 120 MG 24 hr capsule Take 120 mg by mouth daily. 06/11/21  Yes [provider]  Ensure (ENSURE) Take 237 mLs by mouth 2 (two) times daily between meals.   Yes [provider]  magnesium oxide (MAG-OX) 400 (240 Mg) MG tablet Take 400 mg by mouth 2 (two) times daily.   Yes  [provider]  metoprolol tartrate (LOPRESSOR) 100 MG tablet Take 1 tablet (100 mg total) by mouth 2 (two) times daily. 03/25/21  Yes Jettie Booze, MD  polyethylene glycol (MIRALAX / GLYCOLAX) 17 g packet Take 17 g by mouth daily. 05/21/21  Yes France Ravens, MD  diltiazem (CARDIZEM CD) 180 MG 24 hr capsule Take 1 capsule (180 mg total) by mouth daily. Patient not taking: Reported on 06/12/2021 04/24/21   Leanor Kail, PA      Allergies    Patient has no known allergies.    Review of Systems   Review of Systems  Unable to perform ROS: Acuity of condition    Physical Exam Updated Vital Signs BP (!) 157/57   Pulse (!) 53   Temp 98.1 F (36.7 C) (Oral)   Resp (!) 24   Ht 5' (1.524 m)   Wt 41.4 kg   SpO2 95%   BMI 17.83 kg/m  Physical Exam Vitals and nursing note reviewed.  Constitutional:      Appearance: She is not ill-appearing or toxic-appearing.  HENT:     Head: Normocephalic and atraumatic.     Nose: Nose normal.     Mouth/Throat:     Mouth: Mucous membranes are moist.     Pharynx: No oropharyngeal exudate or posterior oropharyngeal erythema.  Eyes:     General:        Right eye: No discharge.        Left eye: No discharge.  Extraocular Movements: Extraocular movements intact.     Conjunctiva/sclera: Conjunctivae normal.     Pupils: Pupils are equal, round, and reactive to light.  Cardiovascular:     Rate and Rhythm: Normal rate and regular rhythm.     Pulses: Normal pulses.     Heart sounds: Normal heart sounds. No murmur heard. Pulmonary:     Effort: Pulmonary effort is normal. No respiratory distress.     Breath sounds: Normal breath sounds. No wheezing or rales.  Abdominal:     General: Bowel sounds are normal. There is no distension.     Palpations: Abdomen is soft.     Tenderness: There is no abdominal tenderness. There is no guarding or rebound.  Musculoskeletal:        General: No deformity.     Cervical back: Neck supple.      Right lower leg: No edema.     Left lower leg: Edema present.  Skin:    General: Skin is warm and dry.     Capillary Refill: Capillary refill takes less than 2 seconds.       Neurological:     Mental Status: She is alert. Mental status is at baseline.     GCS: GCS eye subscore is 4. GCS verbal subscore is 3. GCS motor subscore is 5.     Cranial Nerves: Cranial nerves 2-12 are intact.     Sensory: Sensation is intact.     Comments: Motor function difficult to assess due to altered mental status but patient does have symmetric strength in grip and can sustain each leg off of the bed.  Gait deferred.  Patient appears to be hallucinating, speaking to and introducing ED staff to "[place]", whom she states is a family friend and she looks toward and talks to his though someone is in the room though she is alone.  Psychiatric:        Mood and Affect: Mood normal.     ED Results / Procedures / Treatments   Labs (all labs ordered are listed, but only abnormal results are displayed) Labs Reviewed  PROTIME-INR - Abnormal; Notable for the following components:      Result Value   Prothrombin Time 15.4 (*)    All other components within normal limits  CBC - Abnormal; Notable for the following components:   Platelets 115 (*)    All other components within normal limits  COMPREHENSIVE METABOLIC PANEL - Abnormal; Notable for the following components:   Glucose, Bld 125 (*)    Creatinine, Ser 1.22 (*)    Total Protein 5.8 (*)    Total Bilirubin 1.5 (*)    GFR, Estimated 45 (*)    All other components within normal limits  I-STAT CHEM 8, ED - Abnormal; Notable for the following components:   Glucose, Bld 117 (*)    Calcium, Ion 1.13 (*)    All other components within normal limits  CBG MONITORING, ED - Abnormal; Notable for the following components:   Glucose-Capillary 109 (*)    All other components within normal limits  AEROBIC CULTURE W GRAM STAIN (SUPERFICIAL SPECIMEN)  APTT   DIFFERENTIAL  URINALYSIS, ROUTINE W REFLEX MICROSCOPIC  RAPID URINE DRUG SCREEN, HOSP PERFORMED    EKG None  Radiology MR BRAIN WO CONTRAST  Result Date: 06/13/2021 CLINICAL DATA:  Atrial fibrillation.  Acute neurologic deficit. EXAM: MRI HEAD WITHOUT CONTRAST TECHNIQUE: Multiplanar, multiecho pulse sequences of the brain and surrounding structures were obtained without intravenous contrast. COMPARISON:  None Available. FINDINGS: Brain: Small areas of acute or early subacute ischemia within both corona radiata. No acute or chronic hemorrhage. There is multifocal hyperintense T2-weighted signal within the white matter. Generalized cerebral volume loss. The midline structures are normal. Vascular: Major flow voids are preserved. Skull and upper cervical spine: Normal calvarium and skull base. Visualized upper cervical spine and soft tissues are normal. Sinuses/Orbits:No paranasal sinus fluid levels or advanced mucosal thickening. No mastoid or middle ear effusion. Normal orbits. IMPRESSION: Small areas of acute or early subacute ischemia within both corona radiata. No hemorrhage or mass effect. Electronically Signed   By: Ulyses Jarred M.D.   On: 06/13/2021 02:59   DG Ribs Unilateral W/Chest Right  Result Date: 06/13/2021 CLINICAL DATA:  Right-sided chest pain, initial encounter EXAM: RIGHT RIBS AND CHEST - 3+ VIEW COMPARISON:  05/16/2021 FINDINGS: Cardiac shadow is mildly enlarged. Postsurgical changes are again seen. Left-sided pleural effusion is again noted and stable. Lungs are otherwise clear. No acute rib fracture is noted. No pneumothorax is seen. IMPRESSION: Persistent left-sided pleural effusion. No rib abnormality noted. Electronically Signed   By: Inez Catalina M.D.   On: 06/13/2021 01:03   DG Foot Complete Left  Result Date: 06/13/2021 CLINICAL DATA:  Left foot cellulitis EXAM: LEFT FOOT - COMPLETE 3+ VIEW COMPARISON:  None Available. FINDINGS: Soft tissue swelling is noted over the left  foot particularly along the dorsum of the foot. Small calcaneal spur is noted. No acute fracture or dislocation is seen. IMPRESSION: Soft tissue swelling without acute bony abnormality. Electronically Signed   By: Inez Catalina M.D.   On: 06/13/2021 01:02   CT ANGIO HEAD NECK W WO CM W PERF (CODE STROKE)  Result Date: 06/12/2021 CLINICAL DATA:  Acute neurologic deficit EXAM: CT ANGIOGRAPHY HEAD AND NECK CT PERFUSION BRAIN TECHNIQUE: Multidetector CT imaging of the head and neck was performed using the standard protocol during bolus administration of intravenous contrast. Multiplanar CT image reconstructions and MIPs were obtained to evaluate the vascular anatomy. Carotid stenosis measurements (when applicable) are obtained utilizing NASCET criteria, using the distal internal carotid diameter as the denominator. Multiphase CT imaging of the brain was performed following IV bolus contrast injection. Subsequent parametric perfusion maps were calculated using RAPID software. RADIATION DOSE REDUCTION: This exam was performed according to the departmental dose-optimization program which includes automated exposure control, adjustment of the mA and/or kV according to patient size and/or use of iterative reconstruction technique. CONTRAST:  170m OMNIPAQUE IOHEXOL 350 MG/ML SOLN COMPARISON:  None Available. FINDINGS: CTA NECK FINDINGS SKELETON: There is no bony spinal canal stenosis. No lytic or blastic lesion. OTHER NECK: Normal pharynx, larynx and major salivary glands. No cervical lymphadenopathy. Unremarkable thyroid gland. UPPER CHEST: Small left pleural effusion AORTIC ARCH: There is calcific atherosclerosis of the aortic arch. There is no aneurysm, dissection or hemodynamically significant stenosis of the visualized portion of the aorta. Conventional 3 vessel aortic branching pattern. The visualized proximal subclavian arteries are widely patent. RIGHT CAROTID SYSTEM: No dissection, occlusion or aneurysm. Mild  atherosclerotic calcification at the carotid bifurcation without hemodynamically significant stenosis. LEFT CAROTID SYSTEM: Multifocal atherosclerotic calcification without hemodynamically significant stenosis. VERTEBRAL ARTERIES: Left dominant configuration. Both origins are clearly patent. There is no dissection, occlusion or flow-limiting stenosis to the skull base (V1-V3 segments). CTA HEAD FINDINGS POSTERIOR CIRCULATION: --Vertebral arteries: The right vertebral artery terminates in PICA. There is mild atherosclerosis of the left without stenosis. --Inferior cerebellar arteries: Normal. --Basilar artery: Normal. --Superior cerebellar arteries: Normal. --Posterior cerebral  arteries (PCA): Moderate narrowing of the distal left P2 segment. ANTERIOR CIRCULATION: --Intracranial internal carotid arteries: Atherosclerotic calcification of the internal carotid arteries at the skull base without hemodynamically significant stenosis. --Anterior cerebral arteries (ACA): Normal. Both A1 segments are present. Patent anterior communicating artery (a-comm). --Middle cerebral arteries (MCA): Normal. VENOUS SINUSES: As permitted by contrast timing, patent. ANATOMIC VARIANTS: None Review of the MIP images confirms the above findings. CT Brain Perfusion Findings: Perfusion images are degraded by motion but there is no visible perfusion deficit. IMPRESSION: 1. No emergent large vessel occlusion. 2. Moderate narrowing of the distal left PCA P2 segment. 3. Motion degraded CTP without visible perfusion deficit. 4. Small left pleural effusion. 5. Aortic Atherosclerosis (ICD10-I70.0). Electronically Signed   By: Ulyses Jarred M.D.   On: 06/12/2021 23:37   CT HEAD CODE STROKE WO CONTRAST  Result Date: 06/12/2021 CLINICAL DATA:  Code stroke.  Acute neurologic deficit. EXAM: CT HEAD WITHOUT CONTRAST TECHNIQUE: Contiguous axial images were obtained from the base of the skull through the vertex without intravenous contrast. RADIATION  DOSE REDUCTION: This exam was performed according to the departmental dose-optimization program which includes automated exposure control, adjustment of the mA and/or kV according to patient size and/or use of iterative reconstruction technique. COMPARISON:  None Available. FINDINGS: Brain: There is no mass, hemorrhage or extra-axial collection. There is generalized atrophy without lobar predilection. There is hypoattenuation of the periventricular white matter, most commonly indicating chronic ischemic microangiopathy. Old right basal ganglia small vessel infarct. Vascular: No abnormal hyperdensity of the major intracranial arteries or dural venous sinuses. No intracranial atherosclerosis. Skull: The visualized skull base, calvarium and extracranial soft tissues are normal. Sinuses/Orbits: No fluid levels or advanced mucosal thickening of the visualized paranasal sinuses. No mastoid or middle ear effusion. The orbits are normal. ASPECTS [facility] Stroke Program Early CT Score) - Ganglionic level infarction (caudate, lentiform nuclei, internal capsule, insula, M1-M3 cortex): 7 - Supraganglionic infarction (M4-M6 cortex): 3 Total score (0-10 with 10 being normal): 10 IMPRESSION: 1. No acute intracranial abnormality. 2. ASPECTS is 10. These results were called by telephone at the time of interpretation on 06/12/2021 at 11:06 pm to provider Dr. Curly Shores, who verbally acknowledged these results. Electronically Signed   By: Ulyses Jarred M.D.   On: 06/12/2021 23:07    Procedures .Marland KitchenIncision and Drainage  Date/Time: 06/13/2021 4:37 AM  Performed by: Emeline Darling, PA-C Authorized by: Emeline Darling, PA-C   Consent:    Consent obtained:  Verbal   Consent given by:  Patient   Risks, benefits, and alternatives were discussed: yes     Risks discussed:  Bleeding, incomplete drainage and infection   Alternatives discussed:  No treatment Universal protocol:    Patient identity confirmed:  Arm  band Location:    Type:  Hematoma   Size:  4 cmx3cm deep   Location:  Lower extremity   Lower extremity location:  Foot   Foot location:  L foot Pre-procedure details:    Skin preparation:  Chlorhexidine with alcohol Sedation:    Sedation type:  None Anesthesia:    Anesthesia method:  Local infiltration   Local anesthetic:  Lidocaine 2% WITH epi Procedure type:    Complexity:  Simple Procedure details:    Ultrasound guidance: yes     Needle aspiration: no     Incision types:  Single straight   Incision depth:  Subcutaneous   Wound management:  Probed and deloculated and irrigated with saline   Drainage:  Bloody  Drainage amount:  Moderate   Wound treatment:  Wound left open   Packing materials:  None Post-procedure details:    Procedure completion:  Tolerated well, no immediate complications Comments:     Cellulitic appearing site on the left foot, however primarily coagulated blood present on irrigation.  Scant purulent discharge.  Wound culture obtained Ultrasound ED Soft Tissue  Date/Time: 06/13/2021 4:38 AM  Performed by: Emeline Darling, PA-C Authorized by: Emeline Darling, PA-C   Procedure details:    Indications: localization of abscess     Transverse view:  Visualized   Longitudinal view:  Visualized   Images: not archived     Limitations:  Patient compliance Location:    Location: lower extremity     Location comment:  Foot   Side:  Left Findings:     abscess present    cellulitis present .Critical Care  Performed by: Emeline Darling, PA-C Authorized by: Emeline Darling, PA-C   Critical care provider statement:    Critical care time (minutes):  60   Critical care was time spent personally by me on the following activities:  Development of treatment plan with patient or surrogate, discussions with consultants, evaluation of patient's response to treatment, examination of patient, obtaining history from patient or surrogate,  ordering and performing treatments and interventions, ordering and review of laboratory studies, ordering and review of radiographic studies, pulse oximetry and re-evaluation of patient's condition     Medications Ordered in ED Medications  sodium chloride flush (NS) 0.9 % injection 3 mL (has no administration in time range)  ceFAZolin (ANCEF) IVPB 1 g/50 mL premix (has no administration in time range)  iohexol (OMNIPAQUE) 350 MG/ML injection 100 mL (100 mLs Intravenous Contrast Given 06/12/21 2311)  LORazepam (ATIVAN) injection 1 mg (1 mg Intravenous Given 06/13/21 0055)  lidocaine-EPINEPHrine (XYLOCAINE W/EPI) 2 %-1:200000 (PF) injection 10 mL (10 mLs Intradermal Given 06/13/21 0415)  hydrALAZINE (APRESOLINE) injection 10 mg (10 mg Intravenous Given 06/13/21 0413)    ED Course/ Medical Decision Making/ A&P Clinical Course as of 06/13/21 0534  Thu Jun 13, 2021  0050 Ativan ordered to assist with helping patient lie still during MRI brain.   [RS]  0422 ECG Heart Rate: 67 [RS]  2595 MR BRAIN WO CONTRAST [RS]  [phone] Per neurologist, Dr. Curly Shores: Additional recs: "Hold Eliquis in the setting of acute stroke at least overnight and morning dose Additional recs: Hold Eliquis in the setting of acute stroke at least overnight and morning dose Permissive hypertension Echocardiogram would not change management as the patient is already indicated for anticoagulation Stroke team to follow  Permissive hypertension Echocardiogram would not change management as the patient is already indicated for anticoagulation Stroke team to follow."  [RS]  0509 Consult to internal medicine resident who is agreeable to admitting this patient to their service. I appreciate her collaboration in the care of this patient. [RS]    Clinical Course User Index [RS] Namya Voges, Gypsy Balsam, PA-C                           Medical Decision Making 81 year old female presents with concern for altered mental status and aphasia.  Code  stroke activated on intake.  Hypertensive with systolic pressure greater than 240 with EMS but 564 systolic on intake in the emergency department.  Cardiopulmonary exam is unremarkable, abdominal exam is benign.  Patient is neurovascular intact in all extremities with large left foot hematoma/abscess as  above.  Patient speaking clearly but nonsensically, appears to be hallucinating visually and auditory referring to a man who is not in the room and speaking, so he is there.  Moving all extremities spontaneously without difficulty, PERRL, EOMI.   The differential diagnosis for AMS is extensive and includes, but is not limited to:  Drug overdose - opioids, alcohol, sedatives, antipsychotics, drug withdrawal, others Metabolic: hypoxia, hypoglycemia, hyperglycemia, hypercalcemia, hypernatremia, hyponatremia, uremia, hepatic encephalopathy, hypothyroidism, hyperthyroidism, vitamin B12 or thiamine deficiency, carbon monoxide poisoning, Wilson's disease, Lactic acidosis, DKA/HHOS Infectious: meningitis, encephalitis, bacteremia/sepsis, urinary tract infection, pneumonia, neurosyphilis Structural: Space-occupying lesion, (brain tumor, subdural hematoma, hydrocephalus,) Vascular: stroke, subarachnoid hemorrhage, coronary ischemia, hypertensive encephalopathy, CNS vasculitis, thrombotic thrombocytopenic purpura, disseminated intravascular coagulation, hyperviscosity Psychiatric: Schizophrenia, depression; Other: Seizure, hypothermia, heat stroke, ICU psychosis, dementia -"sundowning."     Amount and/or Complexity of Data Reviewed Labs: ordered.    Details: CBC unremarkable, CMP with mildly elevated creatinine to 1.2 elevated from patient's baseline of 0.9.  INR is normal.  UA pending. Radiology: ordered. Decision-making details documented in ED Course.    Details: CT head and CT angiogram code stroke negative for acute intracranial abnormality or acute LVO.  MR of the brain does show small areas of acute  or early subacute ischemia in both corona radiata without hemorrhage or mass effect. Plain film of the foot without osteomyelitis, DG chest with persistent left pleural effusion.  Risk Prescription drug management. Decision regarding hospitalization.   Patient has remained altered in her mental status throughout her stay in the emergency department.  Will require admission for completion of stroke work-up given acute/subacute ischemic changes on her MRI today and for further investigation of her persistent altered mental status in context of largely reassuring vital signs.  Consult to internal medicine as above, Urine pending, ABX ordered.   This chart was dictated using voice recognition software, Dragon. Despite the best efforts of this provider to proofread and correct errors, errors may still occur which can change documentation meaning.  Final Clinical Impression(s) / ED Diagnoses Final diagnoses:  Acute ischemic stroke [facility])  Cellulitis of left foot    Rx / DC Orders ED Discharge Orders     None         Emeline Darling, PA-C 06/13/21 0534    Maudie Flakes, MD 06/13/21 [phone]

## 2021-06-12 NOTE — Consult Note (Incomplete Revision)
Neurology Consultation Reason for Consult: Aphasia, AMS Requesting Physician: Gerlene Fee  CC: Aphasia and AMS  History is obtained from: Primarily chart review given AMS  HPI: Nicole Mercado is a 81 y.o. 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  Per EMS, the patient was last seen well at about 5 PM.  At 10 PM she was found in her room with her pants off, talking funny and her words were not making sense.  Her neurological symptoms were stable during their evaluation and transportation, and they noted that the patient was quite hypertensive on their arrival (242/82)  She had a recent similar presentation 05/16/2021 with aphasia and left-sided facial droop.  Stroke work-up was negative and symptoms spontaneously resolved.  EEG was negative as well, but there was a suspicion of mild baseline cognitive impairment.  Work-up summary: -LDL 63, A1c 5.0 -CTA head and neck with left V2 and left P2 PCA moderate stenosis, 40% stenosis of the left common carotid artery -CT perfusion with a 33 mL penumbra without core -MRI brain without acute abnormality with age-related atrophy and chronic small vessel ischemic disease -TEE with EF 55 to 60%, mild to moderate mitral regurg, left atrial appendage thrombus without atrial level shunt and a prosthetic aortic valve present (3/30)  LKW: 5 PM tPA given?: No, on Eliquis and out of the window IA performed?: No, no LVO on CTA  Premorbid modified rankin scale:      3 - Moderate disability. Requires some help, but able to walk unassisted.     4 - Moderately severe disability. Unable to attend to own bodily needs without assistance, and unable to walk unassisted.     5 - Severe disability. Requires constant nursing care and  attention, bedridden, incontinent.     6 - Dead.  ROS:  unable to obtain due to altered mental status.   Past Medical History:  Diagnosis Date   Aortic stenosis    a. Echocardiogram (01/11/14):  Mild LVH, EF 60-65%, Gr 1 DD, possible bicuspid AV, severe AS (mean 61 mmHg, peak 104 mmHg), mild MVP of post leaflet, mild MR, PASP 33 mmHg.;  b. 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 LHC (01/13/14):  dLM 70 extending into oLAD, mLAD 40-50, pD1 80, pD2 70, ostial/prox OM1 70 >> CABG (L-LAD, S-OM1, S-D1, S-D2)  // Myoview 10/22: Fixed defect anterolateral wall consistent with artifact, EF 71, no ischemia, low risk   Carotid artery occlusion    a. s/p L CEA 2013;  b.  Carotid US (1/16):  Bilateral ICA 1-39% // Carotid US 10/22: Bilateral ICA 1-39   CLL (chronic lymphocytic leukemia) (HCC)    slow leukemia---Dr  Murinson   H/O exercise stress test    NSSTT   Hx of cardiovascular stress test    a. Nuclear stress test That (4/13): Normal perfusion, EF 74%   Hx of echocardiogram    Echo (2/16):  Mild LVH, EF 60-65%, no RWMA, Gr 2 DD, AVR ok (mean 9 mmHg), trivial AI, mild MR, mild LAE, PASP 40 mmHg   Hypertension    PAD (peripheral artery disease) ([place])    LE Arterial US 10/22: R - pCFA,  oSFA, dSFA 30-49; L - pCFA + mSFA 50-74, pPop 30-49 >> referred to [facility]   Pancreatitis    h/o   Thrombocytopenia ([place]) 11/19/2010   Past Surgical History:  Procedure Laterality Date   ABDOMINAL HYSTERECTOMY     AORTIC VALVE REPLACEMENT [name] 01/17/2014   Procedure: AORTIC VALVE REPLACEMENT (AVR);  Surgeon: Gaye Pollack, MD;  Location: [place];  Service: Open Heart Surgery;  Laterality: [name];   APPENDECTOMY     CARDIOVERSION [name] 04/04/2021   Procedure: CARDIOVERSION;  Surgeon: [name], DO;  Location: [facility];  Service: Cardiovascular;  Laterality: [name];   CARDIOVERSION [name] 05/08/2021   Procedure:  CARDIOVERSION;  Surgeon: [name], DO;  Location: [place];  Service: Cardiovascular;  Laterality: [name];   CAROTID ENDARTERECTOMY  06/09/11   LEFT  cea   CESAREAN SECTION     FIVE   CHOLECYSTECTOMY     CORONARY ARTERY BYPASS GRAFT [name] 01/17/2014   Procedure: CORONARY ARTERY BYPASS GRAFTING (CABG)TIMES FOUR USING LEFT INTERNAL MAMMARY ARTERY AND RIGHT SAPHENOUS VEIN HARVESTED ENDOSCOPICALLY;  Surgeon: Gaye Pollack, MD;  Location: [place];  Service: Open Heart Surgery;  Laterality: [name];   ENDARTERECTOMY  06/09/2011   Procedure: ENDARTERECTOMY CAROTID;  Surgeon: Rosetta Posner, MD;  Location: [place];  Service: Vascular;  Laterality: Left;  left carotid endarterectomy with patch angioplasty   LEFT AND RIGHT HEART CATHETERIZATION WITH CORONARY ANGIOGRAM [name] 01/13/2014   Procedure: LEFT AND RIGHT HEART CATHETERIZATION WITH CORONARY ANGIOGRAM;  Surgeon: Jettie Booze, MD;  Location: [facility] CATH [facility];  Service: Cardiovascular;  Laterality: [name];   TEE WITHOUT CARDIOVERSION [name] 01/17/2014   Procedure: TRANSESOPHAGEAL ECHOCARDIOGRAM (TEE);  Surgeon: Gaye Pollack, MD;  Location: [place];  Service: Open Heart Surgery;  Laterality: [name];   TEE WITHOUT CARDIOVERSION [name] 04/04/2021   Procedure: TRANSESOPHAGEAL ECHOCARDIOGRAM (TEE);  Surgeon: [name], DO;  Location: [facility];  Service: Cardiovascular;  Laterality: [name];   TONSILLECTOMY       Family History  Problem Relation Age of Onset   Heart disease Mother        in her 44s   Hypertension Mother    Heart attack Mother    Heart disease Father    Hypertension Father    Hypertension Sister    Hypertension Son    Stroke Paternal Grandmother    Stroke Paternal Grandfather     Social History:  reports that she has never smoked. She has never used smokeless tobacco. She reports that she does not drink alcohol and does not use drugs.   Exam: Current vital signs: BP (!) 170/96   Pulse 79   Temp 98.1 F (36.7 C) (Oral)   Resp 13   Ht 5' (1.524  m)   Wt 41.4 kg   SpO2 97%   BMI 17.83 kg/m  Vital signs in last 24 hours:     Physical Exam  Constitutional: Appears well-developed and well-nourished.  Psych: Affect pleasant with inappropriate smiling/laughter at times Eyes: No scleral injection HENT: No oropharyngeal obstruction.  MSK: Apparent soft tissue swelling on the dorsum of the left foot Cardiovascular: Irregularly irregular Respiratory: Effort normal, non-labored breathing GI: Soft.  No distension. There is no tenderness.  Skin: Warm dry and intact visible skin  Neuro: Mental Status: Patient is awake, alert, oriented to person, place, year, but not month or situation. Unable to give any significant history.  At times her answers to questions are clear but at other times she responds in word salad.  She is able to repeat basic sentences and name some basic objects Cranial Nerves: II: Visual Fields are full to orienting to stimuli in all quadrants. Pupils are equal, round, and reactive to light.   III,IV, VI: EOMI with bilateral incomplete eye closure V: Facial sensation is symmetric to light eyelash brush VII: Facial movement is symmetric.  VIII: hearing is intact to voice X: Uvula elevates symmetrically XI: Shoulder shrug is symmetric. XII: tongue is midline without atrophy or fasciculations.  Motor: Tone is normal. Bulk is normal.  Confrontational strength testing limited by her confusion.  However she has no pronator drift of the bilateral upper extremities.  The left lower extremity is not antigravity, but the right lower extremity has no drift with encouragement Sensory: Equally reactive to light noxious stimulus in all 4 extremities Plantar reflexes: Upgoing bilaterally Cerebellar: FNF and HKS are intact bilaterally Gait:  Deferred   NIHSS total 8 Score breakdown: One-point for not answering month correctly, 2 points for left lower extremity drift, 2 points for bilateral upper extremity ataxia, one-point  for mild to moderate aphasia, one-point for dysarthria Performed at time of patient arrival to ED    I have reviewed labs in epic and the results pertinent to this consultation are:  Basic Metabolic Panel: Recent Labs  Lab 06/12/21 2254 06/12/21 2255  NA 135 137  K 4.1 4.0  CL 100 100  CO2  --  26  GLUCOSE 117* 125*  BUN 22 18  CREATININE 1.00 1.22*  CALCIUM  --  9.9    CBC: Recent Labs  Lab 06/12/21 2254 06/12/21 2255  WBC  --  7.6  NEUTROABS  --  4.0  HGB 14.3 13.9  HCT 42.0 41.5  MCV  --  95.2  PLT  --  115*    Coagulation Studies: Recent Labs    06/12/21 2255  LABPROT 15.4*  INR 1.2      I have reviewed the images obtained:   Head CT personally reviewed, agree with radiology no evidence of acute intracranial process  CTA/CT perfusion personally reviewed, agree with radiology: 1. No emergent large vessel occlusion. 2. Moderate narrowing of the distal left PCA P2 segment. 3. Motion degraded CTP without visible perfusion deficit. 4. Small left pleural effusion. 5. Aortic Atherosclerosis (ICD10-I70.0).  Impression: Given the clinical picture, suspect hypertensive emergency, however given her risk factors will obtain MRI brain to rule out acute intracranial process and confirmed a safe for the patient to continue Eliquis.  Given her recent thorough stroke work-up, I do not feel like she needs a full repeat work-up at this time  Recommendations: - MRI brain w/o contrast  - Hold Eliquis until MRI complete - Permissive hypertension pending MRI  Lesleigh Noe MD-PhD [facility] [phone] Available 7 PM to 7 AM, outside of these hours please call Neurologist on call as listed on Amion.  CRITICAL CARE Performed by: Lorenza Chick   Total critical care time: 45 minutes  Critical care time was exclusive of separately billable procedures and treating other patients.  Critical care was necessary to treat or prevent imminent or  life-threatening deterioration.  Critical care was time spent personally by me on the following activities: development of treatment plan with patient and/or surrogate as well as nursing, discussions with consultants, evaluation of patient's response to treatment, examination of patient, obtaining history from patient or surrogate, ordering and performing treatments and interventions, ordering and review of laboratory studies, ordering and review of radiographic studies, pulse oximetry and  re-evaluation of patient's condition.  Addendum: MRI brain personally reviewed, agree with radiology that there are small infarcts in the bilateral corona radiata.  Additional recs: Hold Eliquis in the setting of acute stroke at least overnight and morning dose Permissive hypertension to 220/110 given acute stroke Echocardiogram would not change management as the patient is already indicated for anticoagulation Stroke team to follow

## 2021-06-12 NOTE — Consult Note (Addendum)
Neurology Consultation Reason for Consult: Aphasia, AMS Requesting Physician: Gerlene Fee  CC: Aphasia and AMS  History is obtained from: Primarily chart review given AMS  HPI: Nicole Mercado is a 81 y.o. 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  Per EMS, the patient was last seen well at about 5 PM.  At 10 PM she was found in her room with her pants off, talking funny and her words were not making sense.  Her neurological symptoms were stable during their evaluation and transportation, and they noted that the patient was quite hypertensive on their arrival (242/82)  She had a recent similar presentation 05/16/2021 with aphasia and left-sided facial droop.  Stroke work-up was negative and symptoms spontaneously resolved.  EEG was negative as well, but there was a suspicion of mild baseline cognitive impairment.  Work-up summary: -LDL 63, A1c 5.0 -CTA head and neck with left V2 and left P2 PCA moderate stenosis, 40% stenosis of the left common carotid artery -CT perfusion with a 33 mL penumbra without core -MRI brain without acute abnormality with age-related atrophy and chronic small vessel ischemic disease -TEE with EF 55 to 60%, mild to moderate mitral regurg, left atrial appendage thrombus without atrial level shunt and a prosthetic aortic valve present (3/30)  LKW: 5 PM tPA given?: No, on Eliquis and out of the window IA performed?: No, no LVO on CTA  Premorbid modified rankin scale:      3 - Moderate disability. Requires some help, but able to walk unassisted.     4 - Moderately severe disability. Unable to attend to own bodily needs without assistance, and unable to walk unassisted.     5 - Severe disability. Requires constant nursing care and  attention, bedridden, incontinent.     6 - Dead.  ROS:  unable to obtain due to altered mental status.   Past Medical History:  Diagnosis Date   Aortic stenosis    a. Echocardiogram (01/11/14):  Mild LVH, EF 60-65%, Gr 1 DD, possible bicuspid AV, severe AS (mean 61 mmHg, peak 104 mmHg), mild MVP of post leaflet, mild MR, PASP 33 mmHg.;  b. 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 LHC (01/13/14):  dLM 70 extending into oLAD, mLAD 40-50, pD1 80, pD2 70, ostial/prox OM1 70 >> CABG (L-LAD, S-OM1, S-D1, S-D2)  // Myoview 10/22: Fixed defect anterolateral wall consistent with artifact, EF 71, no ischemia, low risk   Carotid artery occlusion    a. s/p L CEA 2013;  b.  Carotid US (1/16):  Bilateral ICA 1-39% // Carotid US 10/22: Bilateral ICA 1-39   CLL (chronic lymphocytic leukemia) (HCC)    slow leukemia---Dr  Murinson   H/O exercise stress test    NSSTT   Hx of cardiovascular stress test    a. Nuclear stress test That (4/13): Normal perfusion, EF 74%   Hx of echocardiogram    Echo (2/16):  Mild LVH, EF 60-65%, no RWMA, Gr 2 DD, AVR ok (mean 9 mmHg), trivial AI, mild MR, mild LAE, PASP 40 mmHg   Hypertension    PAD (peripheral artery disease) ([place])    LE Arterial US 10/22: R - pCFA,  oSFA, dSFA 30-49; L - pCFA + mSFA 50-74, pPop 30-49 >> referred to [facility]   Pancreatitis    h/o   Thrombocytopenia ([place]) 11/19/2010   Past Surgical History:  Procedure Laterality Date   ABDOMINAL HYSTERECTOMY     AORTIC VALVE REPLACEMENT [name] 01/17/2014   Procedure: AORTIC VALVE REPLACEMENT (AVR);  Surgeon: Gaye Pollack, MD;  Location: [place];  Service: Open Heart Surgery;  Laterality: [name];   APPENDECTOMY     CARDIOVERSION [name] 04/04/2021   Procedure: CARDIOVERSION;  Surgeon: [name], DO;  Location: [place] [facility];  Service: Cardiovascular;  Laterality: [name];   CARDIOVERSION [name] 05/08/2021   Procedure:  CARDIOVERSION;  Surgeon: [name], DO;  Location: [place];  Service: Cardiovascular;  Laterality: [name];   CAROTID ENDARTERECTOMY  06/09/11   LEFT  cea   CESAREAN SECTION     FIVE   CHOLECYSTECTOMY     CORONARY ARTERY BYPASS GRAFT [name] 01/17/2014   Procedure: CORONARY ARTERY BYPASS GRAFTING (CABG)TIMES FOUR USING LEFT INTERNAL MAMMARY ARTERY AND RIGHT SAPHENOUS VEIN HARVESTED ENDOSCOPICALLY;  Surgeon: Gaye Pollack, MD;  Location: [place];  Service: Open Heart Surgery;  Laterality: [name];   ENDARTERECTOMY  06/09/2011   Procedure: ENDARTERECTOMY CAROTID;  Surgeon: Rosetta Posner, MD;  Location: [place];  Service: Vascular;  Laterality: Left;  left carotid endarterectomy with patch angioplasty   LEFT AND RIGHT HEART CATHETERIZATION WITH CORONARY ANGIOGRAM [name] 01/13/2014   Procedure: LEFT AND RIGHT HEART CATHETERIZATION WITH CORONARY ANGIOGRAM;  Surgeon: Jettie Booze, MD;  Location: [facility] CATH LAB;  Service: Cardiovascular;  Laterality: [name];   TEE WITHOUT CARDIOVERSION [name] 01/17/2014   Procedure: TRANSESOPHAGEAL ECHOCARDIOGRAM (TEE);  Surgeon: Gaye Pollack, MD;  Location: [place];  Service: Open Heart Surgery;  Laterality: [name];   TEE WITHOUT CARDIOVERSION [name] 04/04/2021   Procedure: TRANSESOPHAGEAL ECHOCARDIOGRAM (TEE);  Surgeon: [name], DO;  Location: [facility];  Service: Cardiovascular;  Laterality: [name];   TONSILLECTOMY       Family History  Problem Relation Age of Onset   Heart disease Mother        in her 32s   Hypertension Mother    Heart attack Mother    Heart disease Father    Hypertension Father    Hypertension Sister    Hypertension Son    Stroke Paternal Grandmother    Stroke Paternal Grandfather     Social History:  reports that she has never smoked. She has never used smokeless tobacco. She reports that she does not drink alcohol and does not use drugs.   Exam: Current vital signs: BP (!) 170/96   Pulse 79   Temp 98.1 F (36.7 C) (Oral)   Resp 13   Ht 5' (1.524  m)   Wt 41.4 kg   SpO2 97%   BMI 17.83 kg/m  Vital signs in last 24 hours:     Physical Exam  Constitutional: Appears well-developed and well-nourished.  Psych: Affect pleasant with inappropriate smiling/laughter at times Eyes: No scleral injection HENT: No oropharyngeal obstruction.  MSK: Apparent soft tissue swelling on the dorsum of the left foot Cardiovascular: Irregularly irregular Respiratory: Effort normal, non-labored breathing GI: Soft.  No distension. There is no tenderness.  Skin: Warm dry and intact visible skin  Neuro: Mental Status: Patient is awake, alert, oriented to person, place, year, but not month or situation. Unable to give any significant history.  At times her answers to questions are clear but at other times she responds in word salad.  She is able to repeat basic sentences and name some basic objects Cranial Nerves: II: Visual Fields are full to orienting to stimuli in all quadrants. Pupils are equal, round, and reactive to light.   III,IV, VI: EOMI with bilateral incomplete eye closure V: Facial sensation is symmetric to light eyelash brush VII: Facial movement is symmetric.  VIII: hearing is intact to voice X: Uvula elevates symmetrically XI: Shoulder shrug is symmetric. XII: tongue is midline without atrophy or fasciculations.  Motor: Tone is normal. Bulk is normal.  Confrontational strength testing limited by her confusion.  However she has no pronator drift of the bilateral upper extremities.  The left lower extremity is not antigravity, but the right lower extremity has no drift with encouragement Sensory: Equally reactive to light noxious stimulus in all 4 extremities Plantar reflexes: Upgoing bilaterally Cerebellar: FNF and HKS are intact bilaterally Gait:  Deferred   NIHSS total 8 Score breakdown: One-point for not answering month correctly, 2 points for left lower extremity drift, 2 points for bilateral upper extremity ataxia, one-point  for mild to moderate aphasia, one-point for dysarthria Performed at time of patient arrival to ED    I have reviewed labs in epic and the results pertinent to this consultation are:  Basic Metabolic Panel: Recent Labs  Lab 06/12/21 2254 06/12/21 2255  NA 135 137  K 4.1 4.0  CL 100 100  CO2  --  26  GLUCOSE 117* 125*  BUN 22 18  CREATININE 1.00 1.22*  CALCIUM  --  9.9    CBC: Recent Labs  Lab 06/12/21 2254 06/12/21 2255  WBC  --  7.6  NEUTROABS  --  4.0  HGB 14.3 13.9  HCT 42.0 41.5  MCV  --  95.2  PLT  --  115*    Coagulation Studies: Recent Labs    06/12/21 2255  LABPROT 15.4*  INR 1.2      I have reviewed the images obtained:   Head CT personally reviewed, agree with radiology no evidence of acute intracranial process  CTA/CT perfusion personally reviewed, agree with radiology: 1. No emergent large vessel occlusion. 2. Moderate narrowing of the distal left PCA P2 segment. 3. Motion degraded CTP without visible perfusion deficit. 4. Small left pleural effusion. 5. Aortic Atherosclerosis (ICD10-I70.0).  Impression: Given the clinical picture, suspect hypertensive emergency, however given her risk factors will obtain MRI brain to rule out acute intracranial process and confirmed a safe for the patient to continue Eliquis.  Given her recent thorough stroke work-up, I do not feel like she needs a full repeat work-up at this time  Recommendations: - MRI brain w/o contrast  - Hold Eliquis until MRI complete - Permissive hypertension pending MRI  Lesleigh Noe MD-PhD [facility] [phone] Available 7 PM to 7 AM, outside of these hours please call Neurologist on call as listed on Amion.  CRITICAL CARE Performed by: Lorenza Chick   Total critical care time: 45 minutes  Critical care time was exclusive of separately billable procedures and treating other patients.  Critical care was necessary to treat or prevent imminent or  life-threatening deterioration.  Critical care was time spent personally by me on the following activities: development of treatment plan with patient and/or surrogate as well as nursing, discussions with consultants, evaluation of patient's response to treatment, examination of patient, obtaining history from patient or surrogate, ordering and performing treatments and interventions, ordering and review of laboratory studies, ordering and review of radiographic studies, pulse oximetry and  re-evaluation of patient's condition.  Addendum: MRI brain personally reviewed, agree with radiology that there are small infarcts in the bilateral corona radiata.  Additional recs: Hold Eliquis in the setting of acute stroke at least overnight and morning dose Permissive hypertension to 220/110 given acute stroke Echocardiogram would not change management as the patient is already indicated for anticoagulation Stroke team to follow

## 2021-06-13 ENCOUNTER — Inpatient Hospital Stay (HOSPITAL_COMMUNITY): Payer: Medicare Other

## 2021-06-13 ENCOUNTER — Emergency Department (HOSPITAL_COMMUNITY): Payer: Medicare Other

## 2021-06-13 DIAGNOSIS — I2510 Atherosclerotic heart disease of native coronary artery without angina pectoris: Secondary | ICD-10-CM | POA: Diagnosis present

## 2021-06-13 DIAGNOSIS — F0390 Unspecified dementia without behavioral disturbance: Secondary | ICD-10-CM | POA: Diagnosis present

## 2021-06-13 DIAGNOSIS — I6522 Occlusion and stenosis of left carotid artery: Secondary | ICD-10-CM | POA: Diagnosis present

## 2021-06-13 DIAGNOSIS — R29708 NIHSS score 8: Secondary | ICD-10-CM | POA: Diagnosis present

## 2021-06-13 DIAGNOSIS — L03116 Cellulitis of left lower limb: Secondary | ICD-10-CM | POA: Diagnosis present

## 2021-06-13 DIAGNOSIS — Z681 Body mass index (BMI) 19 or less, adult: Secondary | ICD-10-CM | POA: Diagnosis not present

## 2021-06-13 DIAGNOSIS — E785 Hyperlipidemia, unspecified: Secondary | ICD-10-CM | POA: Diagnosis present

## 2021-06-13 DIAGNOSIS — I63413 Cerebral infarction due to embolism of bilateral middle cerebral arteries: Secondary | ICD-10-CM | POA: Diagnosis not present

## 2021-06-13 DIAGNOSIS — I700 Atherosclerosis of aorta: Secondary | ICD-10-CM | POA: Diagnosis present

## 2021-06-13 DIAGNOSIS — C9110 Chronic lymphocytic leukemia of B-cell type not having achieved remission: Secondary | ICD-10-CM | POA: Diagnosis present

## 2021-06-13 DIAGNOSIS — Z951 Presence of aortocoronary bypass graft: Secondary | ICD-10-CM | POA: Diagnosis not present

## 2021-06-13 DIAGNOSIS — Z7901 Long term (current) use of anticoagulants: Secondary | ICD-10-CM | POA: Diagnosis not present

## 2021-06-13 DIAGNOSIS — I6622 Occlusion and stenosis of left posterior cerebral artery: Secondary | ICD-10-CM | POA: Diagnosis present

## 2021-06-13 DIAGNOSIS — D696 Thrombocytopenia, unspecified: Secondary | ICD-10-CM | POA: Diagnosis present

## 2021-06-13 DIAGNOSIS — I4821 Permanent atrial fibrillation: Secondary | ICD-10-CM | POA: Diagnosis present

## 2021-06-13 DIAGNOSIS — X58XXXA Exposure to other specified factors, initial encounter: Secondary | ICD-10-CM | POA: Diagnosis present

## 2021-06-13 DIAGNOSIS — I639 Cerebral infarction, unspecified: Secondary | ICD-10-CM | POA: Diagnosis present

## 2021-06-13 DIAGNOSIS — E43 Unspecified severe protein-calorie malnutrition: Secondary | ICD-10-CM | POA: Diagnosis present

## 2021-06-13 DIAGNOSIS — J90 Pleural effusion, not elsewhere classified: Secondary | ICD-10-CM | POA: Diagnosis present

## 2021-06-13 DIAGNOSIS — N1831 Chronic kidney disease, stage 3a: Secondary | ICD-10-CM | POA: Diagnosis present

## 2021-06-13 DIAGNOSIS — L02612 Cutaneous abscess of left foot: Secondary | ICD-10-CM | POA: Diagnosis present

## 2021-06-13 DIAGNOSIS — F05 Delirium due to known physiological condition: Secondary | ICD-10-CM | POA: Diagnosis not present

## 2021-06-13 DIAGNOSIS — Z66 Do not resuscitate: Secondary | ICD-10-CM | POA: Diagnosis present

## 2021-06-13 DIAGNOSIS — Z953 Presence of xenogenic heart valve: Secondary | ICD-10-CM | POA: Diagnosis not present

## 2021-06-13 DIAGNOSIS — I129 Hypertensive chronic kidney disease with stage 1 through stage 4 chronic kidney disease, or unspecified chronic kidney disease: Secondary | ICD-10-CM | POA: Diagnosis present

## 2021-06-13 DIAGNOSIS — I6340 Cerebral infarction due to embolism of unspecified cerebral artery: Secondary | ICD-10-CM | POA: Diagnosis present

## 2021-06-13 DIAGNOSIS — R4701 Aphasia: Secondary | ICD-10-CM | POA: Diagnosis present

## 2021-06-13 LAB — RAPID URINE DRUG SCREEN, HOSP PERFORMED
Amphetamines: NOT DETECTED
Barbiturates: NOT DETECTED
Benzodiazepines: NOT DETECTED
Cocaine: NOT DETECTED
Opiates: NOT DETECTED
Tetrahydrocannabinol: NOT DETECTED

## 2021-06-13 LAB — URINALYSIS, ROUTINE W REFLEX MICROSCOPIC
Bilirubin Urine: NEGATIVE
Glucose, UA: NEGATIVE mg/dL
Hgb urine dipstick: NEGATIVE
Ketones, ur: NEGATIVE mg/dL
Leukocytes,Ua: NEGATIVE
Nitrite: NEGATIVE
Protein, ur: 30 mg/dL — AB
Specific Gravity, Urine: 1.036 — ABNORMAL HIGH (ref 1.005–1.030)
pH: 7 (ref 5.0–8.0)

## 2021-06-13 LAB — TSH: TSH: 2.984 u[IU]/mL (ref 0.350–4.500)

## 2021-06-13 LAB — HEPARIN LEVEL (UNFRACTIONATED): Heparin Unfractionated: >1.1 IU/mL — ABNORMAL HIGH (ref 0.30–0.70)

## 2021-06-13 LAB — APTT: aPTT: 24 seconds (ref 24–36)

## 2021-06-13 IMAGING — MR MR HEAD W/O CM
8 of 10 series · 30 of 48 positions shown · non-contrast
Comparison: None Available.

CLINICAL DATA: Atrial fibrillation.  Acute neurologic deficit.

EXAM:
MRI HEAD WITHOUT CONTRAST
TECHNIQUE: Multiplanar, multiecho pulse sequences of the brain and surrounding
structures were obtained without intravenous contrast.

[Series 2: DWI · axial · 3.0mm · 0.94mm/px · z∈[-46,+95]mm · 8 of 99 slices shown (1 of 2)]
[im 1/99]
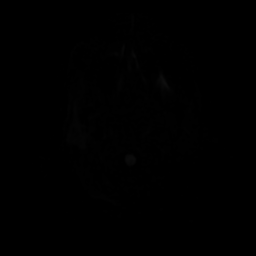
[im 11/99]
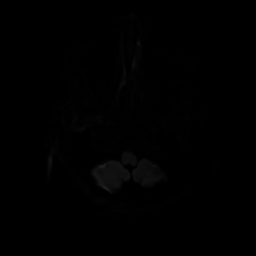
[im 33/99]
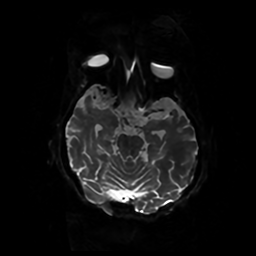
[im 44/99]
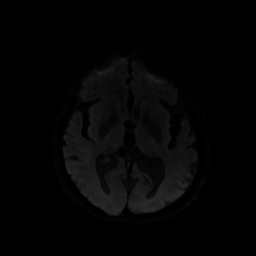
[im 55/99]
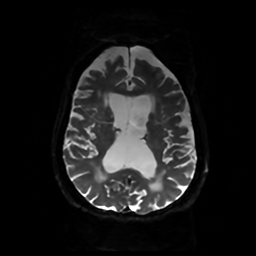
[im 66/99]
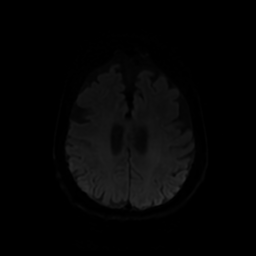
[im 88/99]
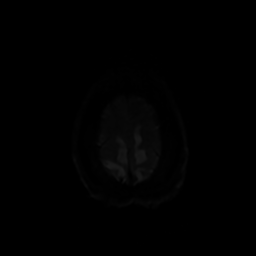
[im 99/99]
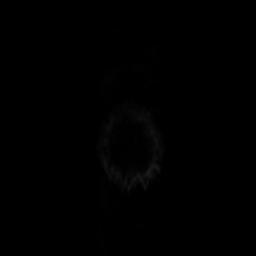

[Series 3: DWI · coronal · 4.0mm · 0.94mm/px · 7 of 76 slices shown (2 of 2)]
[im 1/76]
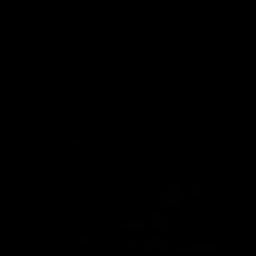
[im 13/76]
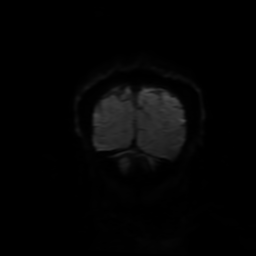
[im 26/76]
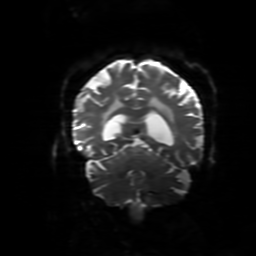
[im 38/76]
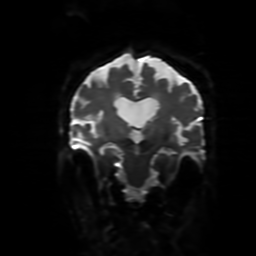
[im 51/76]
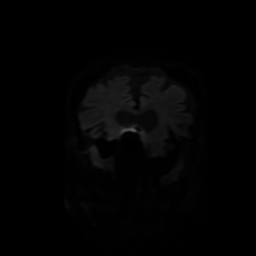
[im 63/76]
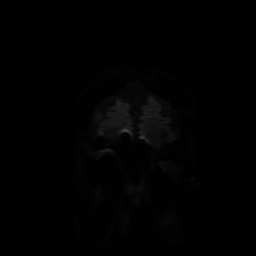
[im 76/76]
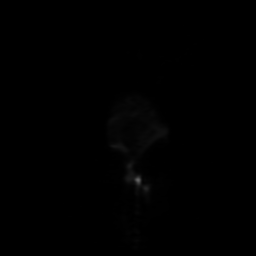

[Series 4: FLAIR · axial · 5.0mm · 0.47mm/px · z∈[-45,+99]mm · 2 of 26 slices shown (1 of 2)]
[im 1/26]
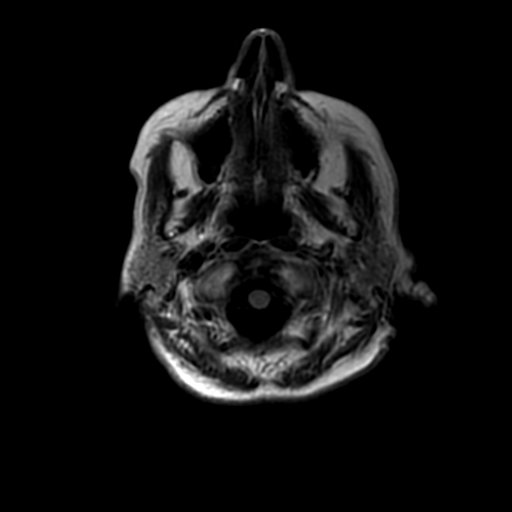
[im 26/26]
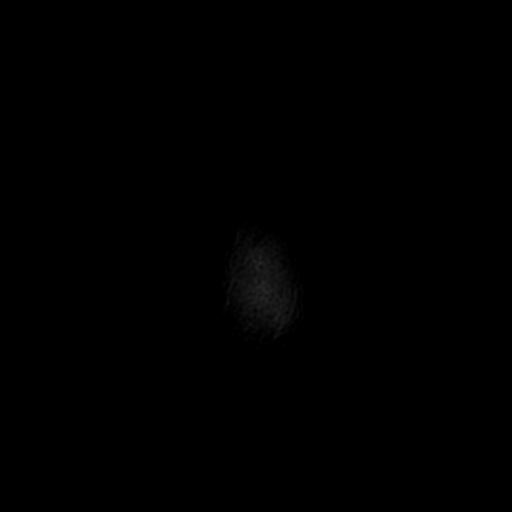

[Series 7: FLAIR · sagittal · 5.0mm · 0.47mm/px · 2 of 25 slices shown (2 of 2)]
[im 1/25]
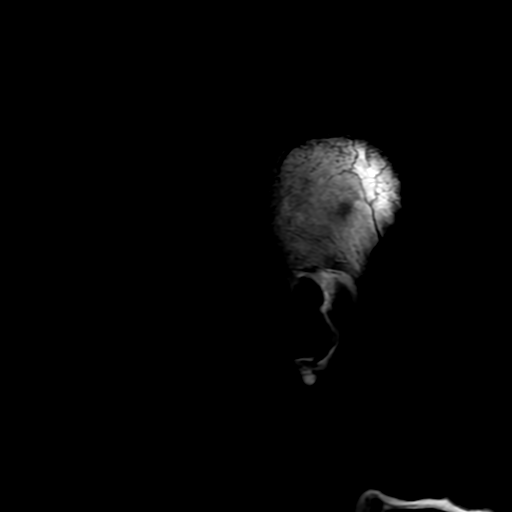
[im 25/25]
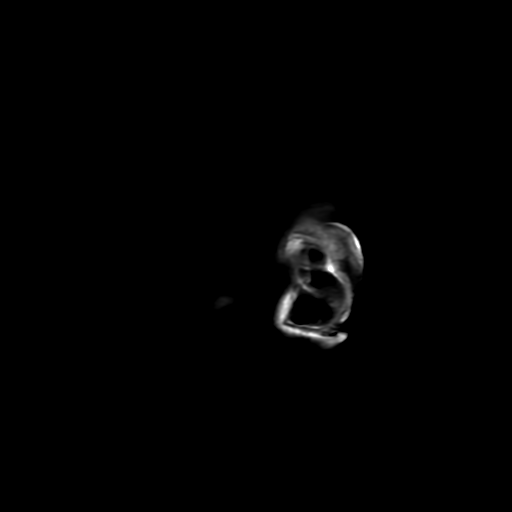

[Series 8: T2 · axial · 5.0mm · 0.23mm/px · z∈[-49,+101]mm · 2 of 27 slices shown (1 of 2)]
[im 1/27]
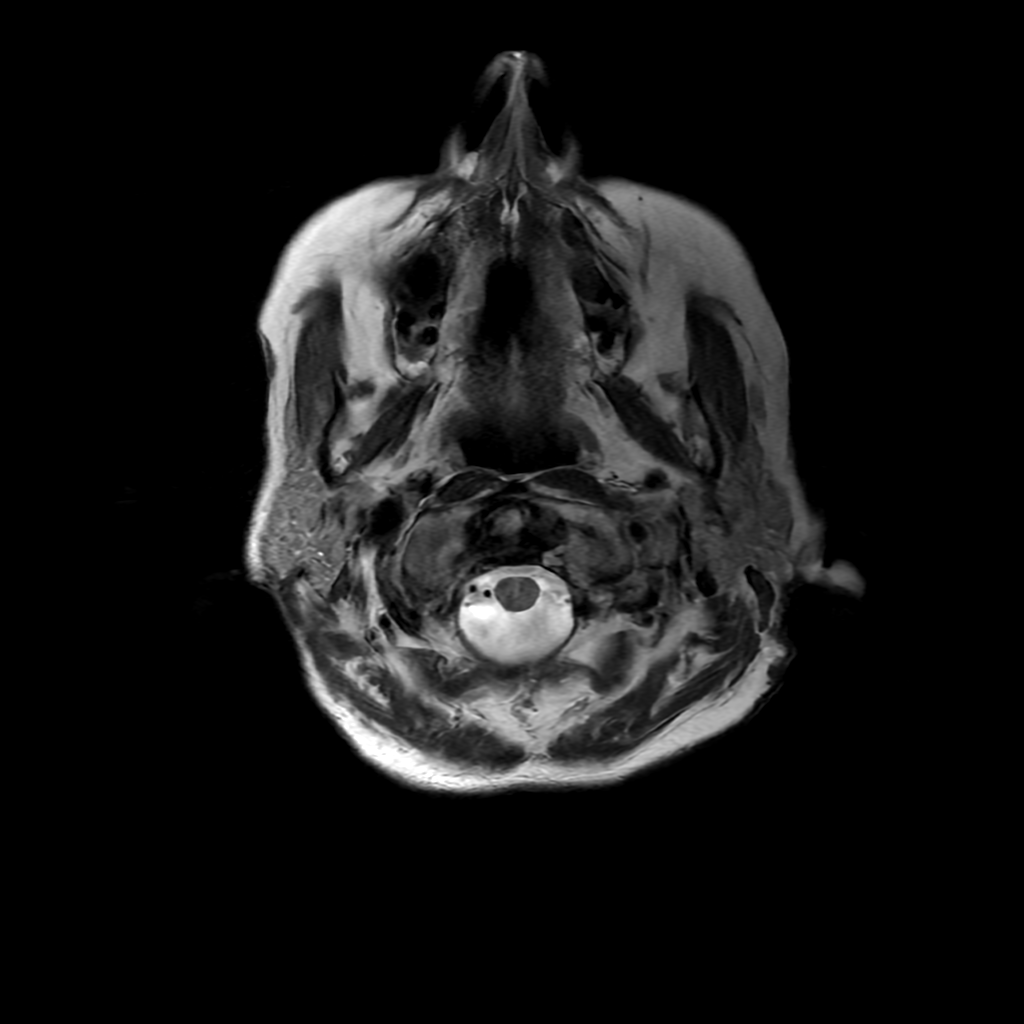
[im 27/27]
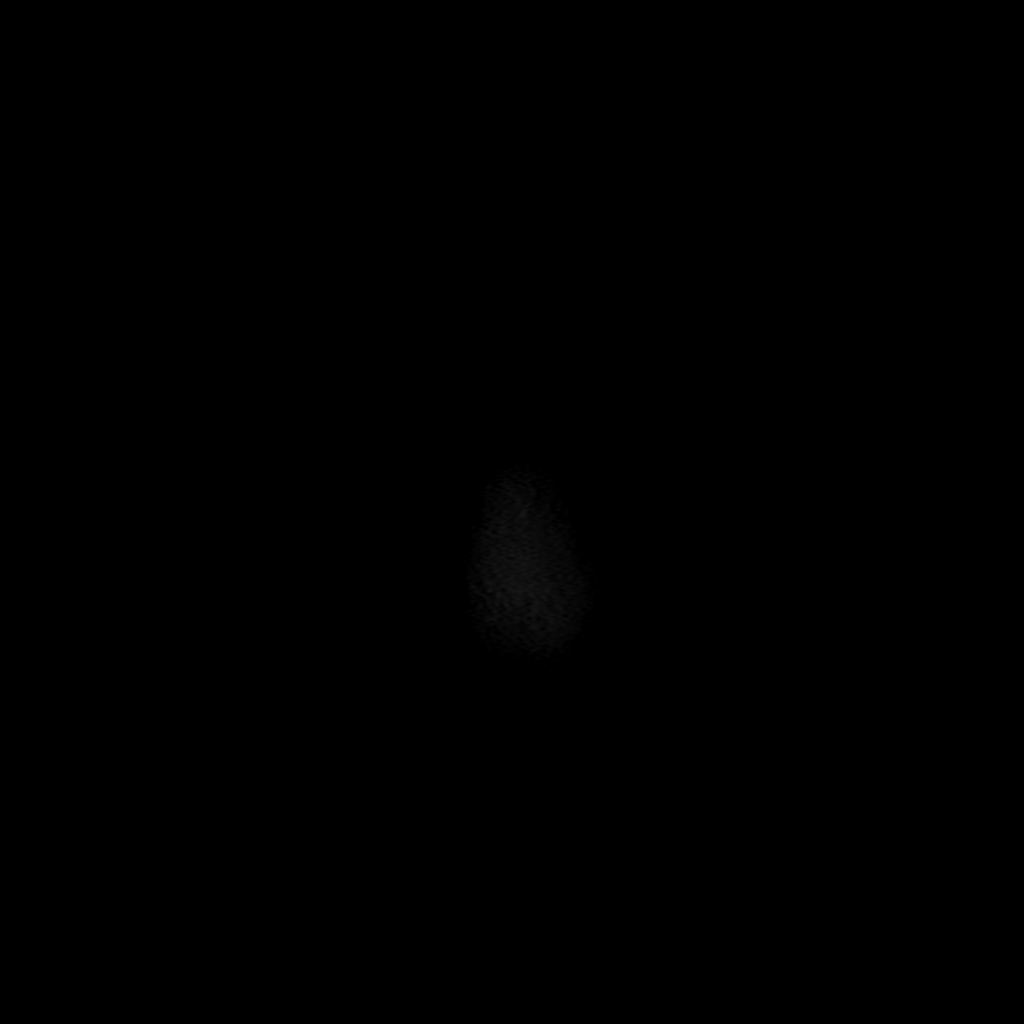

[Series 9: T2 · coronal · 5.0mm · 0.39mm/px · 1 of 30 slices shown (2 of 2)]
[im 1/30]
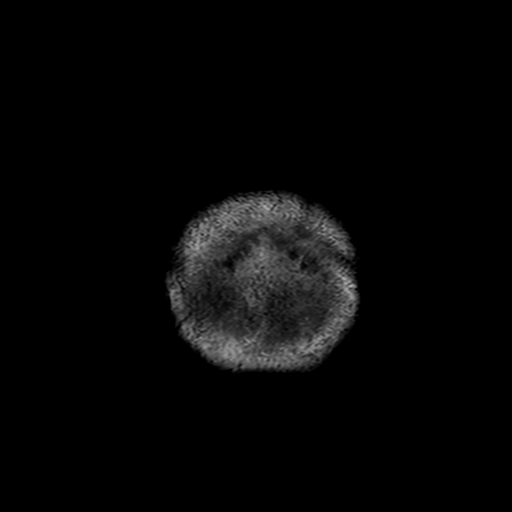

[Series 250: ADC · axial · 3.0mm · 0.94mm/px · z∈[-46,+95]mm · 5 of 50 slices shown (1 of 2)]
[im 1/50]
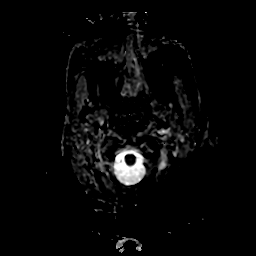
[im 13/50]
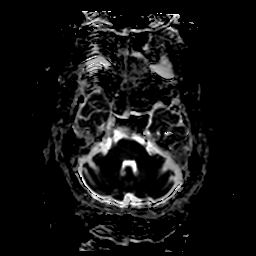
[im 25/50]
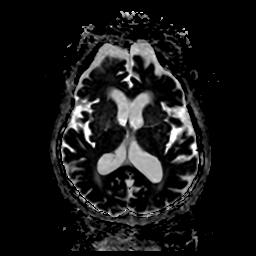
[im 37/50]
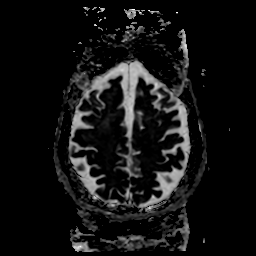
[im 50/50]
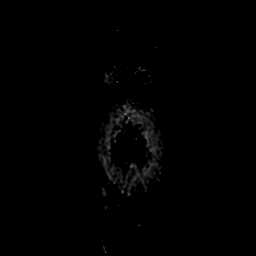

[Series 350: ADC · coronal · 4.0mm · 0.94mm/px · 3 of 38 slices shown (2 of 2)]
[im 1/38]
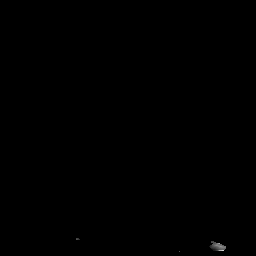
[im 19/38]
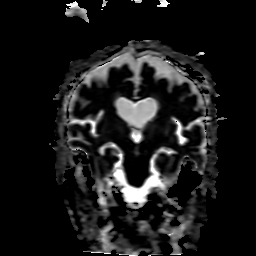
[im 38/38]
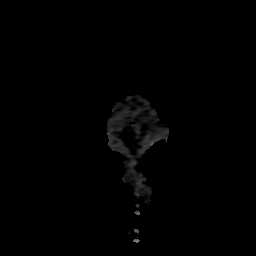

[30 of 48 positions shown; findings below may reference images not displayed]

FINDINGS: Brain: Small areas of acute or early subacute ischemia within both
corona radiata. No acute or chronic hemorrhage. There is multifocal
hyperintense T2-weighted signal within the white matter. Generalized
cerebral volume loss. The midline structures are normal.

Vascular: Major flow voids are preserved.

Skull and upper cervical spine: Normal calvarium and skull base.
Visualized upper cervical spine and soft tissues are normal.

Sinuses/Orbits:No paranasal sinus fluid levels or advanced mucosal
thickening. No mastoid or middle ear effusion. Normal orbits.
IMPRESSION: Small areas of acute or early subacute ischemia within both corona
radiata. No hemorrhage or mass effect.

## 2021-06-13 IMAGING — DX DG CHEST 1V PORT
1 series · 1 of 1 positions shown · non-contrast
Comparison: [DATE]

CLINICAL DATA: Code stroke

EXAM:
PORTABLE CHEST 1 VIEW

[chest]
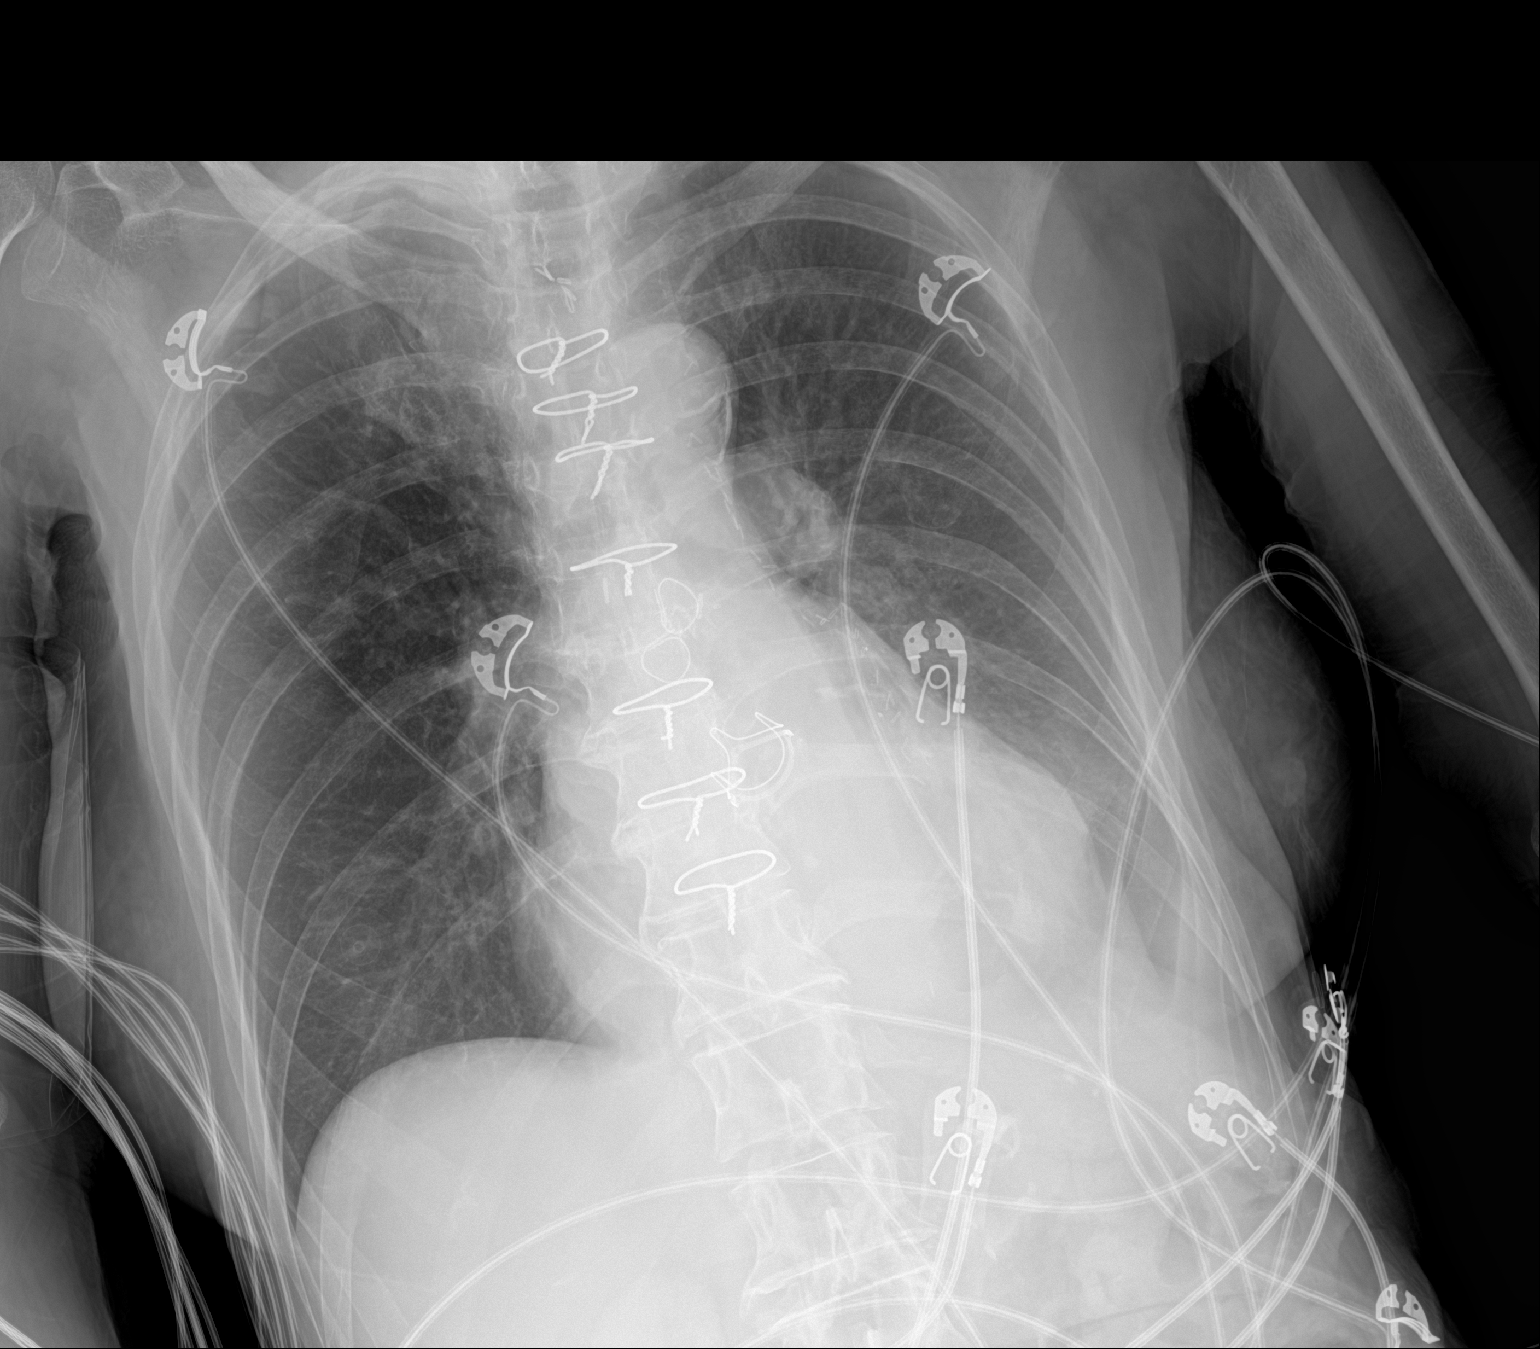

[1 of 1 positions shown; findings below may reference images not displayed]

FINDINGS: Persistent left pleural effusion with adjacent atelectasis. Stable
cardiomediastinal contours. Post aortic valve replacement.
IMPRESSION: Persistent left pleural effusion with adjacent atelectasis.

## 2021-06-13 IMAGING — DX DG FOOT COMPLETE 3+V*L*
2 series · 3 of 3 positions shown · non-contrast
Comparison: None Available.

CLINICAL DATA: Left foot cellulitis

EXAM:
LEFT FOOT - COMPLETE 3+ VIEW

[Series 1: foot · 0.14mm/px · 2 of 2 slices shown]
[im 1/2]
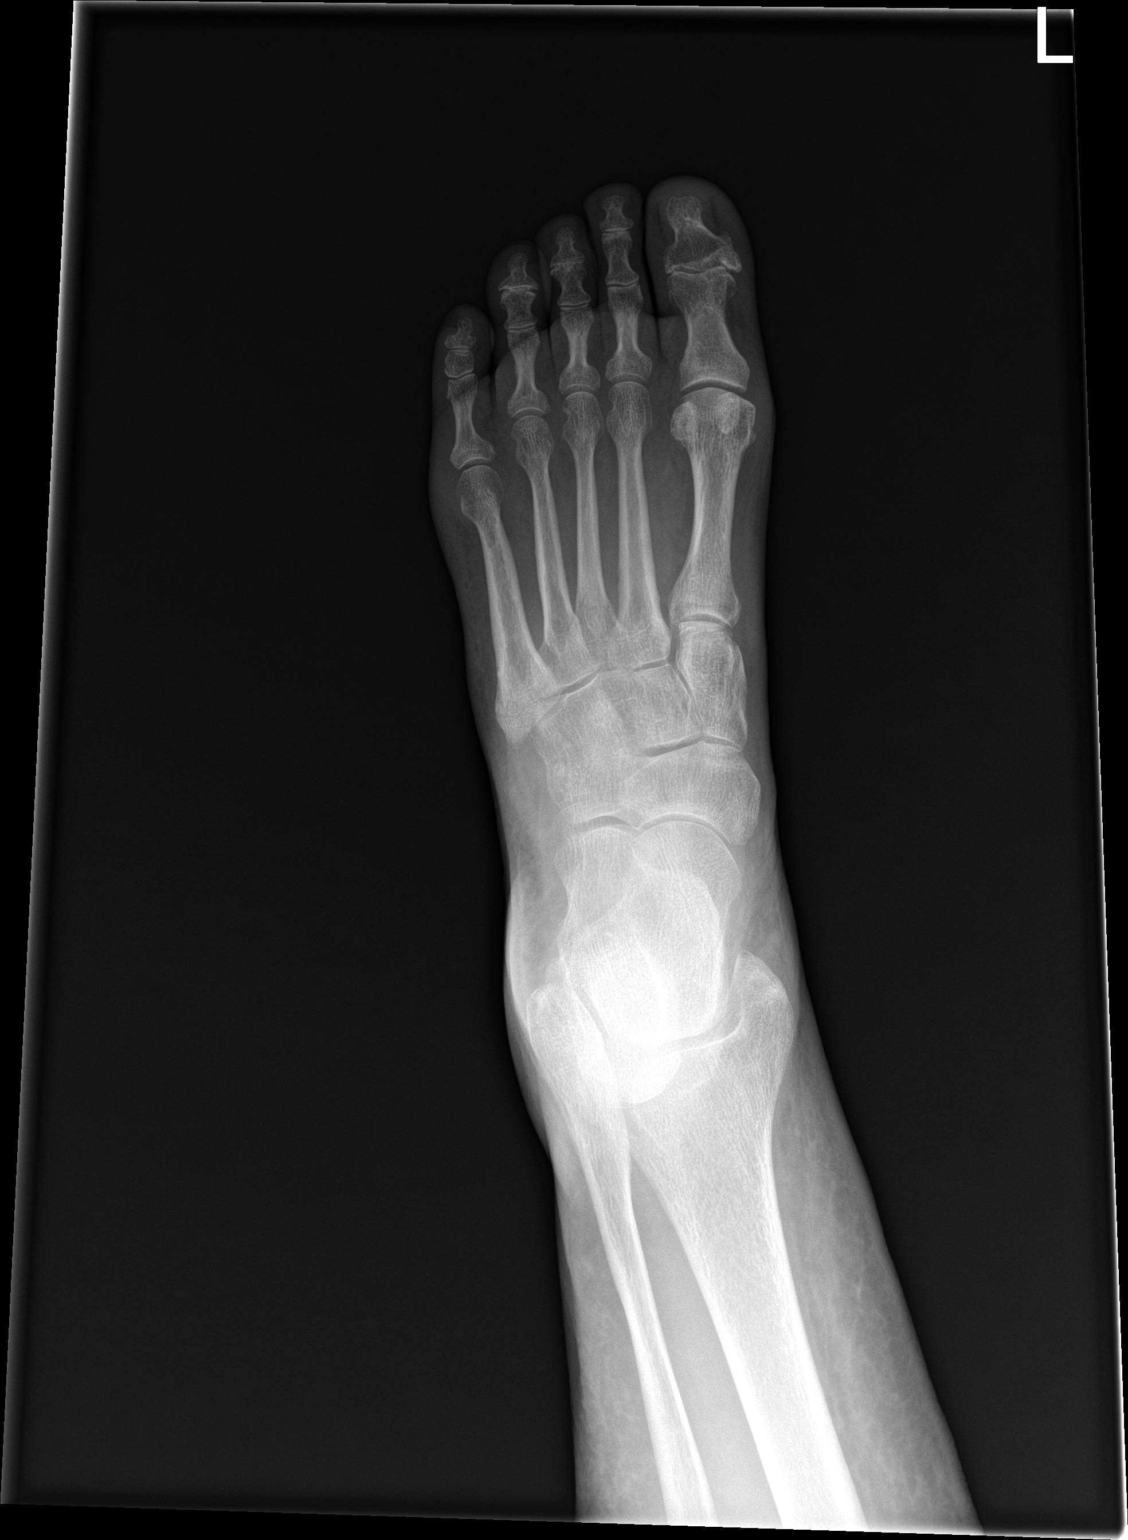
[im 2/2]
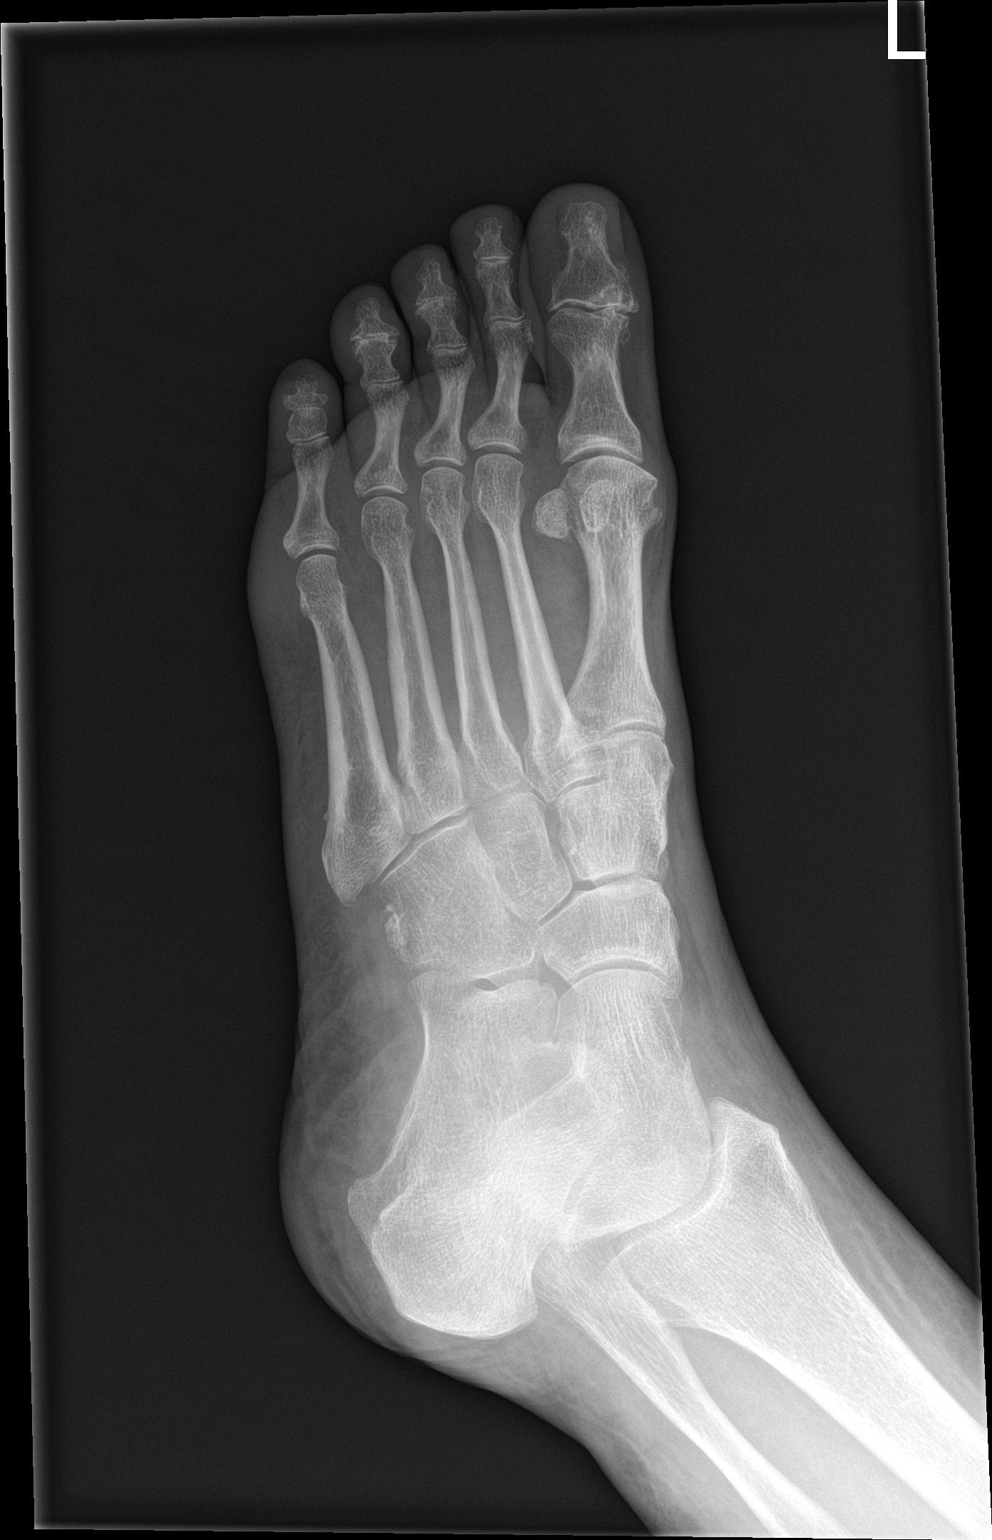

[leg]
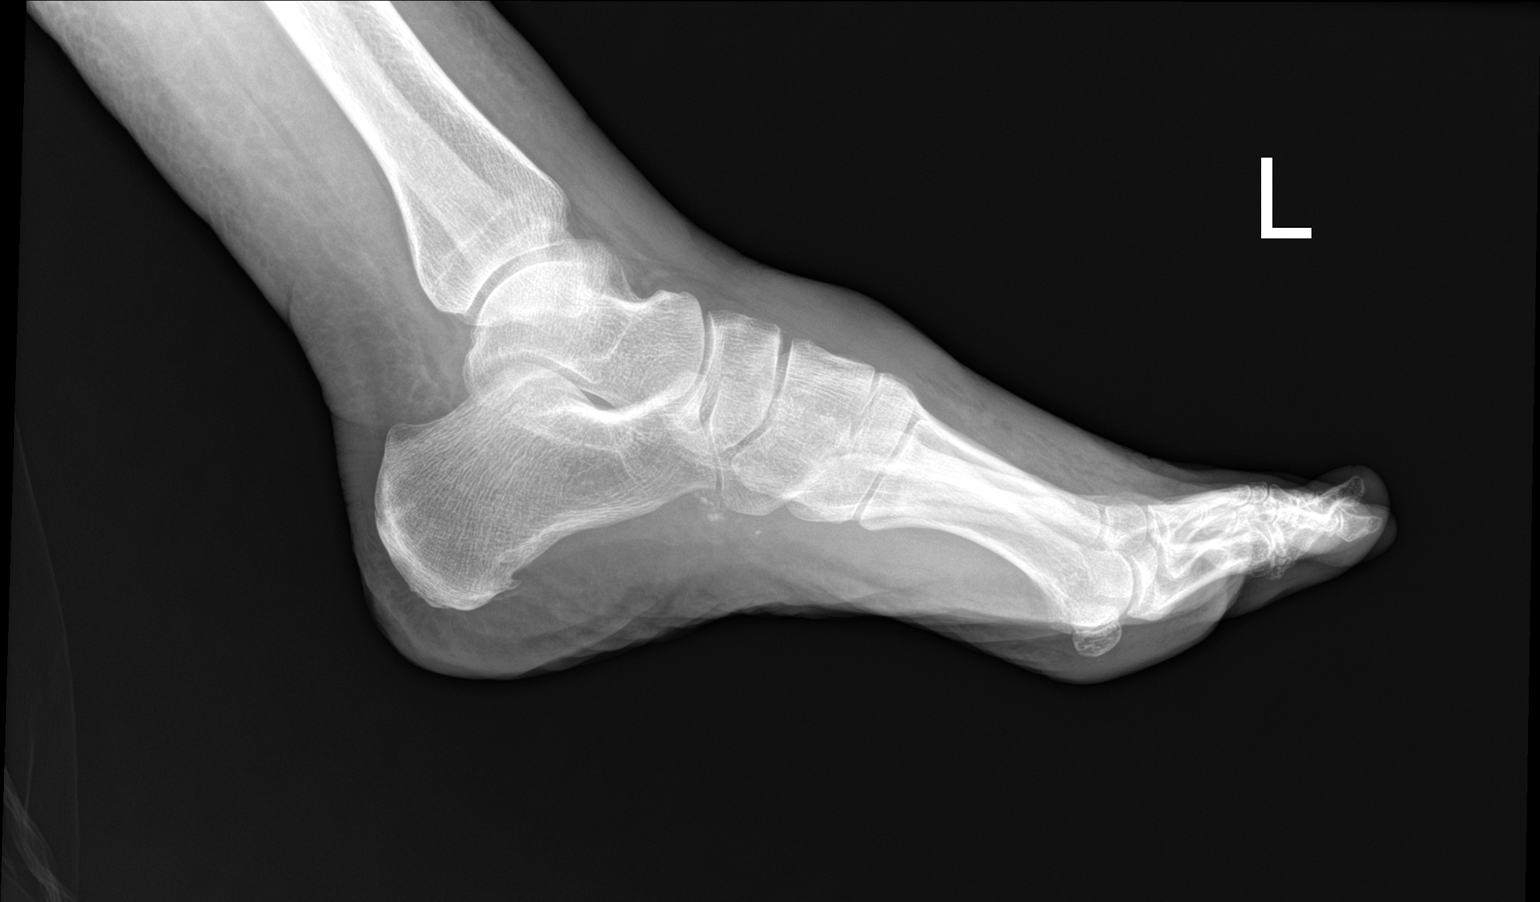

[3 of 3 positions shown; findings below may reference images not displayed]

FINDINGS: Soft tissue swelling is noted over the left foot particularly along
the dorsum of the foot. Small calcaneal spur is noted. No acute
fracture or dislocation is seen.
IMPRESSION: Soft tissue swelling without acute bony abnormality.

## 2021-06-13 MED ORDER — AMIODARONE HCL IN DEXTROSE 360-4.14 MG/200ML-% IV SOLN
30.0000 mg/h | INTRAVENOUS | Status: DC
  Administered 2021-06-13 – 2021-06-15 (×5): 30 mg/h via INTRAVENOUS
  Filled 2021-06-13 (×4): qty 200

## 2021-06-13 MED ORDER — AMIODARONE LOAD VIA INFUSION
150.0000 mg | Freq: Once | INTRAVENOUS | Status: AC
  Administered 2021-06-13: 150 mg via INTRAVENOUS
  Filled 2021-06-13: qty 83.34

## 2021-06-13 MED ORDER — CEFAZOLIN SODIUM-DEXTROSE 1-4 GM/50ML-% IV SOLN: 1.0000 g | Freq: Once | INTRAVENOUS | Status: DC

## 2021-06-13 MED ORDER — AMIODARONE HCL IN DEXTROSE 360-4.14 MG/200ML-% IV SOLN
60.0000 mg/h | INTRAVENOUS | Status: DC
  Administered 2021-06-13: 60 mg/h via INTRAVENOUS
  Filled 2021-06-13 (×2): qty 200

## 2021-06-13 MED ORDER — HEPARIN (PORCINE) 25000 UT/250ML-% IV SOLN
800.0000 [IU]/h | INTRAVENOUS | Status: DC
  Administered 2021-06-13: 500 [IU]/h via INTRAVENOUS
  Administered 2021-06-15: 800 [IU]/h via INTRAVENOUS
  Filled 2021-06-13 (×2): qty 250

## 2021-06-13 MED ORDER — HYDRALAZINE HCL 20 MG/ML IJ SOLN
10.0000 mg | INTRAMUSCULAR | Status: DC | PRN
  Administered 2021-06-13: 10 mg via INTRAVENOUS
  Filled 2021-06-13 (×2): qty 1

## 2021-06-13 MED ORDER — LORAZEPAM 2 MG/ML IJ SOLN
1.0000 mg | Freq: Once | INTRAMUSCULAR | Status: AC
  Administered 2021-06-13: 1 mg via INTRAVENOUS
  Filled 2021-06-13: qty 1

## 2021-06-13 MED ORDER — LIDOCAINE-EPINEPHRINE (PF) 2 %-1:200000 IJ SOLN
10.0000 mL | Freq: Once | INTRAMUSCULAR | Status: AC
  Administered 2021-06-13: 10 mL via INTRADERMAL
  Filled 2021-06-13: qty 20

## 2021-06-13 MED ORDER — HYDRALAZINE HCL 20 MG/ML IJ SOLN
10.0000 mg | Freq: Once | INTRAMUSCULAR | Status: AC
Start: 2021-06-13 — End: 2021-06-13
  Administered 2021-06-13: 10 mg via INTRAVENOUS
  Filled 2021-06-13: qty 1

## 2021-06-13 NOTE — ED Notes (Signed)
Nicole Mercado (Son) of [place] in [place] called asking for an update. His number is [phone].

## 2021-06-13 NOTE — Evaluation (Signed)
Clinical/Bedside Swallow Evaluation Patient Details  Name: Nicole Mercado MRN: [id number] Date of Birth: 1940-04-10  Today's Date: 06/13/2021 Time: SLP Start Time (ACUTE ONLY): 1029 SLP Stop Time (ACUTE ONLY): 1050 SLP Time Calculation (min) (ACUTE ONLY): 21 min  Past Medical History:  Past Medical History:  Diagnosis Date   Aortic stenosis    a. Echocardiogram (01/11/14):  Mild LVH, EF 60-65%, Gr 1 DD, possible bicuspid AV, severe AS (mean 61 mmHg, peak 104 mmHg), mild MVP of post leaflet, mild MR, PASP 33 mmHg.;  b. 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 LHC (01/13/14):  dLM 70 extending into oLAD, mLAD 40-50, pD1 80, pD2 70, ostial/prox OM1 70 >> CABG (L-LAD, S-OM1, S-D1, S-D2)  // Myoview 10/22: Fixed defect anterolateral wall consistent with artifact, EF 71, no ischemia, low risk   Carotid artery occlusion    a. s/p L CEA 2013;  b.  Carotid US (1/16):  Bilateral ICA 1-39% // Carotid US 10/22: Bilateral ICA 1-39   CLL (chronic lymphocytic leukemia) (HCC)    slow leukemia---Dr  Murinson   H/O exercise stress test    NSSTT   Hx of cardiovascular stress test    a. Nuclear stress test That (4/13): Normal perfusion, EF 74%   Hx of echocardiogram    Echo (2/16):  Mild LVH, EF 60-65%, no RWMA, Gr 2 DD, AVR ok (mean 9 mmHg), trivial AI, mild MR, mild LAE, PASP 40 mmHg   Hypertension    PAD (peripheral artery disease) ([place])    LE Arterial US 10/22: R - pCFA, oSFA, dSFA 30-49; L - pCFA + mSFA 50-74, pPop 30-49 >> referred to [facility]   Pancreatitis    h/o   Thrombocytopenia ([place]) 11/19/2010   Past Surgical History:  Past Surgical History:  Procedure Laterality Date   ABDOMINAL HYSTERECTOMY     AORTIC VALVE REPLACEMENT [name] 01/17/2014   Procedure: AORTIC VALVE REPLACEMENT (AVR);  Surgeon: Gaye Pollack, MD;  Location: [facility];  Service: Open Heart Surgery;  Laterality: [name];   APPENDECTOMY      CARDIOVERSION [name] 04/04/2021   Procedure: CARDIOVERSION;  Surgeon: [name], DO;  Location: [facility];  Service: Cardiovascular;  Laterality: [name];   CARDIOVERSION [name] 05/08/2021   Procedure: CARDIOVERSION;  Surgeon: [name], DO;  Location: [facility];  Service: Cardiovascular;  Laterality: [name];   CAROTID ENDARTERECTOMY  06/09/11   LEFT  cea   CESAREAN SECTION     FIVE   CHOLECYSTECTOMY     CORONARY ARTERY BYPASS GRAFT [name] 01/17/2014   Procedure: CORONARY ARTERY BYPASS GRAFTING (CABG)TIMES FOUR USING LEFT INTERNAL MAMMARY ARTERY AND RIGHT SAPHENOUS VEIN HARVESTED ENDOSCOPICALLY;  Surgeon: Gaye Pollack, MD;  Location: [facility];  Service: Open Heart Surgery;  Laterality: [name];   ENDARTERECTOMY  06/09/2011   Procedure: ENDARTERECTOMY CAROTID;  Surgeon: Rosetta Posner, MD;  Location: [facility];  Service: Vascular;  Laterality: Left;  left carotid endarterectomy with patch angioplasty   LEFT AND RIGHT HEART CATHETERIZATION WITH CORONARY ANGIOGRAM [name] 01/13/2014   Procedure: LEFT AND RIGHT HEART CATHETERIZATION WITH CORONARY ANGIOGRAM;  Surgeon: Jettie Booze, MD;  Location: [facility];  Service: Cardiovascular;  Laterality: [name];   TEE WITHOUT CARDIOVERSION [name] 01/17/2014   Procedure: TRANSESOPHAGEAL ECHOCARDIOGRAM (TEE);  Surgeon: Gaye Pollack, MD;  Location: [facility];  Service: Open Heart Surgery;  Laterality:  [name];   TEE WITHOUT CARDIOVERSION [name] 04/04/2021   Procedure: TRANSESOPHAGEAL ECHOCARDIOGRAM (TEE);  Surgeon: [name], DO;  Location: [facility];  Service: Cardiovascular;  Laterality: [name];   TONSILLECTOMY     HPI:  Pt is an 81 yo female presenting from [facility] with aphasia. MRI showed small areas of acute or early subacute ischemia within both corona radiata. Previous clinical swallow eval last 05/17/21 revealed intermittent coughing with thin liquids. MBS 05/20/21 revealed mild oral deficits and moderate vallecular residue that was effective at clearing residue. Trace, sensed  aspiration occurred x1 before the swallow but did not happen again despite challenging. Dys 3 solids and thin liquids with chin tuck recommended, although there was concern given cognitive status that she might need help wtih training to use strategy. PMH includes: a fib, aortic stenosis s/p PVR, CAD s/p CABG, CAD s/p CEA, PAD, HLD, CLL, and chronic thrombocytopenia and a recent admission for a TIA    Assessment / Plan / Recommendation  Clinical Impression  Pt is sleepy throughout this eval, waking up to stimulation but then dozing back to sleep even at times with POs still in her mouth. Per RN, just starting to wake up this morning after getting ativan overnight. Pt does not consistently follow commands, not performing a chin tuck despite multimodal cues. She had been recommended to use one after MBS last month. Pt has coughing associated with >50% of thin liquid trials without chin tuck, which is increased from report from last evaluation as well. At this time it is difficult to tell if this could be transient due to lethargy vs a more acute dysphagia from infarcts noted on MRI. Would offer a few pieces of ice after oral care if fully alert, and could consider PO meds crushed in puree. She would benefit from SLP f/u to assess if repeat instrumental testing is indicated. Recommend MD also consider ordering SLP speech/language eval. SLP Visit Diagnosis: Dysphagia, unspecified (R13.10)    Aspiration Risk  Moderate aspiration risk    Diet Recommendation NPO except meds;Ice chips PRN after oral care   Medication Administration: Crushed with puree    Other  Recommendations Oral Care Recommendations: Oral care QID    Recommendations for follow up therapy are one component of a multi-disciplinary discharge planning process, led by the attending physician.  Recommendations may be updated based on patient status, additional functional criteria and insurance authorization.  Follow up Recommendations  (tba)       Assistance Recommended at Discharge Frequent or constant Supervision/Assistance  Functional Status Assessment Patient has had a recent decline in their functional status and demonstrates the ability to make significant improvements in function in a reasonable and predictable amount of time.  Frequency and Duration min 2x/week  2 weeks       Prognosis Prognosis for Safe Diet Advancement: Good Barriers to Reach Goals: Cognitive deficits      Swallow Study   General HPI: Pt is an 81 yo female presenting from [facility] with aphasia. MRI showed small areas of acute or early subacute ischemia within both corona radiata. Previous clinical swallow eval last 05/17/21 revealed intermittent coughing with thin liquids. MBS 05/20/21 revealed mild oral deficits and moderate vallecular residue that was effective at clearing residue. Trace, sensed aspiration occurred x1 before the swallow but did not happen again despite challenging. Dys 3 solids and thin liquids with chin tuck recommended, although there was concern given cognitive status that she might need help wtih training to use strategy.  PMH includes: a fib, aortic stenosis s/p PVR, CAD s/p CABG, CAD s/p CEA, PAD, HLD, CLL, and chronic thrombocytopenia and a recent admission for a TIA Type of Study: Bedside Swallow Evaluation Previous Swallow Assessment: see HPI Diet Prior to this Study: NPO Temperature Spikes Noted: No Respiratory Status: Room air History of Recent Intubation: No Behavior/Cognition: Lethargic/Drowsy;Cooperative;Requires cueing Oral Cavity Assessment: Within Functional Limits Oral Care Completed by SLP: No Oral Cavity - Dentition: Adequate natural dentition Patient Positioning: Upright in bed Baseline Vocal Quality: Normal Volitional Cough: Weak Volitional Swallow: Unable to elicit    Oral/Motor/Sensory Function Overall Oral Motor/Sensory Function: Other (comment) (not following all commands but ? R side moving more  then L)   Ice Chips Ice chips: Within functional limits Presentation: Spoon   Thin Liquid Thin Liquid: Impaired Presentation: Cup;Spoon;Straw Oral Phase Functional Implications: Oral holding Pharyngeal  Phase Impairments: Cough - Delayed;Throat Clearing - Immediate    Nectar Thick Nectar Thick Liquid: Not tested   Honey Thick Honey Thick Liquid: Not tested   Puree Puree: Impaired Presentation: Spoon Oral Phase Impairments: Poor awareness of bolus   Solid     Solid: Not tested      Osie Bond., M.A. [place] Office [phone]  Secure chat preferred  06/13/2021,11:01 AM

## 2021-06-13 NOTE — ED Notes (Signed)
RN paged Neurologist at this time to notify of BP increase.

## 2021-06-13 NOTE — ED Notes (Addendum)
Pt changed from wet brief.  Pt bed linens also changed.  RN and Tec were about to in/out cath her however she began urinating, that urine was sent to lab.  Admitting providers bedside, aware of pt's status

## 2021-06-13 NOTE — Progress Notes (Addendum)
HD#0 Subjective:  Overnight Events: Admitted by night team  Patient seen and assessed at bedside.  Appears to track, smile, and minimally follows commands but largely nonverbal.  No family at bedside during assessment.  UPDATE: Noted to be in afib with RVR in 150s. NAD. Son at bedside. Started on amio bolus and drip.  Objective:  Vital signs in last 24 hours: Vitals:   06/13/21 0745 06/13/21 0800 06/13/21 0830 06/13/21 1000  BP: (!) 232/72 (!) 164/60 (!) 130/92 (!) 162/73  Pulse: 70 76 79 (!) 145  Resp: (!) '26 15 15 16  '$ Temp:      TempSrc:      SpO2: 95% 98% 97% 97%  Weight:      Height:       Supplemental O2: Room Air SpO2: 97 %   Physical Exam:  Physical Exam Constitutional:      General: She is not in acute distress.    Appearance: She is ill-appearing.  HENT:     Head: Normocephalic and atraumatic.  Eyes:     Extraocular Movements: Extraocular movements intact.     Pupils: Pupils are equal, round, and reactive to light.  Cardiovascular:     Rate and Rhythm: Normal rate. Rhythm irregular.     Pulses: Normal pulses.     Heart sounds: Normal heart sounds.  Pulmonary:     Effort: Pulmonary effort is normal.     Breath sounds: Normal breath sounds.  Abdominal:     General: Abdomen is flat. There is no distension.     Palpations: Abdomen is soft. There is no mass.  Skin:    General: Skin is warm and dry.     Comments: Left foot hematoma on dorsal side. No surrounding erythema noted. Feet are both warm and dry to touch  Neurological:     Mental Status: She is alert. She is disoriented.     Comments: Unable to assess strength and sensation due to aphasia today. Appears to have left facial droop. Able to move all extremities and hold arms and legs against gravity. Did not assess coordination due to aphasia.     Filed Weights   06/12/21 2328  Weight: 41.4 kg    No intake or output data in the 24 hours ending 06/13/21 1132 Net IO Since Admission: No IO data  has been entered for this period [06/13/21 1132]  Pertinent Labs:    Latest Ref Rng & Units 06/12/2021   10:55 PM 06/12/2021   10:54 PM 05/19/2021    2:04 AM  CBC  WBC 4.0 - 10.5 K/uL 7.6   8.5   Hemoglobin 12.0 - 15.0 g/dL 13.9  14.3  13.0   Hematocrit 36.0 - 46.0 % 41.5  42.0  40.5   Platelets 150 - 400 K/uL 115   142        Latest Ref Rng & Units 06/12/2021   10:55 PM 06/12/2021   10:54 PM 05/21/2021    6:49 AM  CMP  Glucose 70 - 99 mg/dL 125  117  104   BUN 8 - 23 mg/dL '18  22  17   '$ Creatinine 0.44 - 1.00 mg/dL 1.22  1.00  0.95   Sodium 135 - 145 mmol/L 137  135  133   Potassium 3.5 - 5.1 mmol/L 4.0  4.1  3.7   Chloride 98 - 111 mmol/L 100  100  101   CO2 22 - 32 mmol/L 26   24   Calcium 8.9 -  10.3 mg/dL 9.9   9.3   Total Protein 6.5 - 8.1 g/dL 5.8     Total Bilirubin 0.3 - 1.2 mg/dL 1.5     Alkaline Phos 38 - 126 U/L 72     AST 15 - 41 U/L 27     ALT 0 - 44 U/L 18       Imaging: DG CHEST PORT 1 VIEW  Result Date: 06/13/2021 CLINICAL DATA:  Code stroke EXAM: PORTABLE CHEST 1 VIEW COMPARISON:  06/13/2021 FINDINGS: Persistent left pleural effusion with adjacent atelectasis. Stable cardiomediastinal contours. Post aortic valve replacement. IMPRESSION: Persistent left pleural effusion with adjacent atelectasis. Electronically Signed   By: Macy Mis M.D.   On: 06/13/2021 10:42   MR BRAIN WO CONTRAST  Result Date: 06/13/2021 CLINICAL DATA:  Atrial fibrillation.  Acute neurologic deficit. EXAM: MRI HEAD WITHOUT CONTRAST TECHNIQUE: Multiplanar, multiecho pulse sequences of the brain and surrounding structures were obtained without intravenous contrast. COMPARISON:  None Available. FINDINGS: Brain: Small areas of acute or early subacute ischemia within both corona radiata. No acute or chronic hemorrhage. There is multifocal hyperintense T2-weighted signal within the white matter. Generalized cerebral volume loss. The midline structures are normal. Vascular: Major flow voids are  preserved. Skull and upper cervical spine: Normal calvarium and skull base. Visualized upper cervical spine and soft tissues are normal. Sinuses/Orbits:No paranasal sinus fluid levels or advanced mucosal thickening. No mastoid or middle ear effusion. Normal orbits. IMPRESSION: Small areas of acute or early subacute ischemia within both corona radiata. No hemorrhage or mass effect. Electronically Signed   By: Ulyses Jarred M.D.   On: 06/13/2021 02:59   DG Ribs Unilateral W/Chest Right  Result Date: 06/13/2021 CLINICAL DATA:  Right-sided chest pain, initial encounter EXAM: RIGHT RIBS AND CHEST - 3+ VIEW COMPARISON:  05/16/2021 FINDINGS: Cardiac shadow is mildly enlarged. Postsurgical changes are again seen. Left-sided pleural effusion is again noted and stable. Lungs are otherwise clear. No acute rib fracture is noted. No pneumothorax is seen. IMPRESSION: Persistent left-sided pleural effusion. No rib abnormality noted. Electronically Signed   By: Inez Catalina M.D.   On: 06/13/2021 01:03   DG Foot Complete Left  Result Date: 06/13/2021 CLINICAL DATA:  Left foot cellulitis EXAM: LEFT FOOT - COMPLETE 3+ VIEW COMPARISON:  None Available. FINDINGS: Soft tissue swelling is noted over the left foot particularly along the dorsum of the foot. Small calcaneal spur is noted. No acute fracture or dislocation is seen. IMPRESSION: Soft tissue swelling without acute bony abnormality. Electronically Signed   By: Inez Catalina M.D.   On: 06/13/2021 01:02   CT ANGIO HEAD NECK W WO CM W PERF (CODE STROKE)  Result Date: 06/12/2021 CLINICAL DATA:  Acute neurologic deficit EXAM: CT ANGIOGRAPHY HEAD AND NECK CT PERFUSION BRAIN TECHNIQUE: Multidetector CT imaging of the head and neck was performed using the standard protocol during bolus administration of intravenous contrast. Multiplanar CT image reconstructions and MIPs were obtained to evaluate the vascular anatomy. Carotid stenosis measurements (when applicable) are obtained  utilizing NASCET criteria, using the distal internal carotid diameter as the denominator. Multiphase CT imaging of the brain was performed following IV bolus contrast injection. Subsequent parametric perfusion maps were calculated using RAPID software. RADIATION DOSE REDUCTION: This exam was performed according to the departmental dose-optimization program which includes automated exposure control, adjustment of the mA and/or kV according to patient size and/or use of iterative reconstruction technique. CONTRAST:  177m OMNIPAQUE IOHEXOL 350 MG/ML SOLN COMPARISON:  None Available. FINDINGS: CTA  NECK FINDINGS SKELETON: There is no bony spinal canal stenosis. No lytic or blastic lesion. OTHER NECK: Normal pharynx, larynx and major salivary glands. No cervical lymphadenopathy. Unremarkable thyroid gland. UPPER CHEST: Small left pleural effusion AORTIC ARCH: There is calcific atherosclerosis of the aortic arch. There is no aneurysm, dissection or hemodynamically significant stenosis of the visualized portion of the aorta. Conventional 3 vessel aortic branching pattern. The visualized proximal subclavian arteries are widely patent. RIGHT CAROTID SYSTEM: No dissection, occlusion or aneurysm. Mild atherosclerotic calcification at the carotid bifurcation without hemodynamically significant stenosis. LEFT CAROTID SYSTEM: Multifocal atherosclerotic calcification without hemodynamically significant stenosis. VERTEBRAL ARTERIES: Left dominant configuration. Both origins are clearly patent. There is no dissection, occlusion or flow-limiting stenosis to the skull base (V1-V3 segments). CTA HEAD FINDINGS POSTERIOR CIRCULATION: --Vertebral arteries: The right vertebral artery terminates in PICA. There is mild atherosclerosis of the left without stenosis. --Inferior cerebellar arteries: Normal. --Basilar artery: Normal. --Superior cerebellar arteries: Normal. --Posterior cerebral arteries (PCA): Moderate narrowing of the distal left  P2 segment. ANTERIOR CIRCULATION: --Intracranial internal carotid arteries: Atherosclerotic calcification of the internal carotid arteries at the skull base without hemodynamically significant stenosis. --Anterior cerebral arteries (ACA): Normal. Both A1 segments are present. Patent anterior communicating artery (a-comm). --Middle cerebral arteries (MCA): Normal. VENOUS SINUSES: As permitted by contrast timing, patent. ANATOMIC VARIANTS: None Review of the MIP images confirms the above findings. CT Brain Perfusion Findings: Perfusion images are degraded by motion but there is no visible perfusion deficit. IMPRESSION: 1. No emergent large vessel occlusion. 2. Moderate narrowing of the distal left PCA P2 segment. 3. Motion degraded CTP without visible perfusion deficit. 4. Small left pleural effusion. 5. Aortic Atherosclerosis (ICD10-I70.0). Electronically Signed   By: Ulyses Jarred M.D.   On: 06/12/2021 23:37   CT HEAD CODE STROKE WO CONTRAST  Result Date: 06/12/2021 CLINICAL DATA:  Code stroke.  Acute neurologic deficit. EXAM: CT HEAD WITHOUT CONTRAST TECHNIQUE: Contiguous axial images were obtained from the base of the skull through the vertex without intravenous contrast. RADIATION DOSE REDUCTION: This exam was performed according to the departmental dose-optimization program which includes automated exposure control, adjustment of the mA and/or kV according to patient size and/or use of iterative reconstruction technique. COMPARISON:  None Available. FINDINGS: Brain: There is no mass, hemorrhage or extra-axial collection. There is generalized atrophy without lobar predilection. There is hypoattenuation of the periventricular white matter, most commonly indicating chronic ischemic microangiopathy. Old right basal ganglia small vessel infarct. Vascular: No abnormal hyperdensity of the major intracranial arteries or dural venous sinuses. No intracranial atherosclerosis. Skull: The visualized skull base, calvarium  and extracranial soft tissues are normal. Sinuses/Orbits: No fluid levels or advanced mucosal thickening of the visualized paranasal sinuses. No mastoid or middle ear effusion. The orbits are normal. ASPECTS [facility] Stroke Program Early CT Score) - Ganglionic level infarction (caudate, lentiform nuclei, internal capsule, insula, M1-M3 cortex): 7 - Supraganglionic infarction (M4-M6 cortex): 3 Total score (0-10 with 10 being normal): 10 IMPRESSION: 1. No acute intracranial abnormality. 2. ASPECTS is 10. These results were called by telephone at the time of interpretation on 06/12/2021 at 11:06 pm to provider Dr. Curly Shores, who verbally acknowledged these results. Electronically Signed   By: Ulyses Jarred M.D.   On: 06/12/2021 23:07    Assessment/Plan:   Principal Problem:   CVA (cerebral vascular accident) [facility])   Patient Summary: Nicole Mercado is a 81 y.o. with pertinent PMH of atrial fibrillation on Eliquis, aortic stenosis s/p prosthetic valve replacement, CAD s/p CABG,  carotid artery disease s/p CEA, PAD, hyperlipidemia, CLL, and chronic thrombocytopenia and a recent admission for a TIA admitted with a new acute CVA.   #Acute Bilateral Small Corona Radiata CVAs Based on MRI findings, strokes appear to be secondary to small vessel disease rather than cardioembolic given location of stroke despite bilaterality of stroke. Unlikely to have any further benefits with transitioning to Xarelto or pradaxa for stroke prevention but stroke team spoke with family and they want to start pradaxa if insurance allows and when able to tolerate PO. Unlikely to have hemorrhagic conversion given size of strokes and per ELAN trial. -NPO with sips with meds per SLP recommendations -Stroke team following, appreciate recommendations -Goal BP <220/110, Hydralazine if elevated beyond goal -PT/OT eval and treat as able -Will restart statin when able to tolerate PO -On heparin drip per neuro recs in the meantime -Switch to  Pradaxa when able to tolerate PO -May need mIVF if continue to not tolerate PO tomorrow  Atrial Fibrillation, now in RVR Aortic stenosis s/p prosthetic valve replacement in 2016 Paged that patient was in RVR this AM likely in setting of holding home metoprolol and diltiazem. HR elevated to 150s. Started amio drip to avoid lowering BP. HR appeared to be in 110s. Placed on heparin drip for bridging while unable to tolerate po -On heparin gtt while not tolerating PO -On amiodarone infusion -Switch to Pradaxa when able to tolerate PO for anticoagulation  HTN Home med: diltiazem, metoprolol -holding in setting of acute CVA and permissive HTN  Foot Hematoma -Monitoring especially while on heparin drip  CKD Baseline GFR 50.  -Avoid nephrotoxic agents -Daily BMP  Chronic thrombocytopenia Chronic and stable, low at 115 on admission. Monitor while on heparin - daily CBC  CAD s/p CABG in 2016; PAD; HLD; Aortic atherosclerosis Carotid artery disease s/p CEA in 2013 -LDL 63, at goal<70 -Continue to monitor.   Small Left Pleural Effusion Persistent since even as early as 2016. Continue to monitor as needed  Diet: NPO IVF: None,None VTE: Heparin Code: DNR/DNI PT/OT recs: Pending, none. TOC recs: pending   Dispo: Anticipated discharge to  ALF  in 3 days pending stroke workup and able to tolerate PO.   France Ravens, MD 06/13/2021, 11:32 AM Pager: [phone]  Please contact the on call pager after 5 pm and on weekends at [phone].

## 2021-06-13 NOTE — Progress Notes (Signed)
OT Cancellation Note  Patient Details Name: Nicole Mercado MRN: [id number] DOB: 1940-03-04   Cancelled Treatment:    Reason Eval/Treat Not Completed: Medical issues which prohibited therapy. RN requesting hold due to pt's HR elevating. OT will check back later today to assess pt's medical readiness.   Felton Buczynski Elane Armelia Penton 06/13/2021, 10:11 AM

## 2021-06-13 NOTE — ED Notes (Addendum)
Pt unable to waken enough to complete NIH - given Ativen and while she can wake up and tell this RN her name, she quickly falls back asleep despite RN trying to keep her awake.

## 2021-06-13 NOTE — H&P (Signed)
Date: 06/13/2021               Patient Name:  Nicole Mercado MRN: [id number]  DOB: Sep 13, 1940 Age / Sex: 81 y.o., female   PCP: Kristie Cowman, MD         Medical Service: Internal Medicine Teaching Service         Attending Physician: Dr. [name], MD    First Contact: France Ravens Pager: [phone]   Second Contact: Gaylan Gerold, DO Pager: Liliane Shi [phone]       After Hours (After 5p/  First Contact Pager: [phone]  weekends / holidays): Second Contact Pager: [phone]   SUBJECTIVE   Chief Complaint: Aphasia  History of Present Illness: with pertinent PMH of atrial fibrillation on Eliquis, aortic stenosis s/p prosthetic valve, CAD s/p CABG, carotid artery disease s/p CEA, PAD, hyperlipidemia, CLL, and chronic thrombocytopenia and a recent admission for a TIA who presented [facility] via EMS for code stroke. Patient was too altered to give history after having received ativan. She opens her eyes to sternal rub and smiles and intermittent follow simple commands but remains aphasic. Her last known normal was at [facility]. 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 On admission she was hypertensive to the 240, neurology was initially concerned for possible hypertensive encephalopathy. CT head negative for acute abnormality, CT angio showed no large vessel occlusion, however did show moderate narrowing of the distal left PCA P2 segment. MRI was obtained which showed small areas of acute or early subacute ischemia within both corona radiata.   Meds:  Current Meds  Medication Sig   acetaminophen (TYLENOL) 325 MG tablet Take 650 mg by mouth every 6 (six) hours as needed for mild pain or headache.    apixaban (ELIQUIS) 2.5 MG TABS tablet Take 1 tablet (2.5 mg total) by mouth 2 (two) times daily.   diltiazem (CARDIZEM CD) 120 MG 24 hr capsule Take 120 mg by mouth daily.   Ensure (ENSURE) Take 237 mLs by mouth 2 (two) times daily between meals.    magnesium oxide (MAG-OX) 400 (240 Mg) MG tablet Take 400 mg by mouth 2 (two) times daily.   metoprolol tartrate (LOPRESSOR) 100 MG tablet Take 1 tablet (100 mg total) by mouth 2 (two) times daily.   polyethylene glycol (MIRALAX / GLYCOLAX) 17 g packet Take 17 g by mouth daily.    Past Medical History:  Diagnosis Date   Aortic stenosis    a. Echocardiogram (01/11/14):  Mild LVH, EF 60-65%, Gr 1 DD, possible bicuspid AV, severe AS (mean 61 mmHg, peak 104 mmHg), mild MVP of post leaflet, mild MR, PASP 33 mmHg.;  b. 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 LHC (01/13/14):  dLM 70 extending into oLAD, mLAD 40-50, pD1 80, pD2 70, ostial/prox OM1 70 >> CABG (L-LAD, S-OM1, S-D1, S-D2)  // Myoview 10/22: Fixed defect anterolateral wall consistent with artifact, EF 71, no ischemia, low risk   Carotid artery occlusion    a. s/p L CEA 2013;  b.  Carotid US (1/16):  Bilateral ICA 1-39% // Carotid US 10/22: Bilateral ICA 1-39   CLL (chronic lymphocytic leukemia) (HCC)    slow leukemia---Dr  Murinson   H/O exercise stress test    NSSTT   Hx of cardiovascular stress test  a. Nuclear stress test That (4/13): Normal perfusion, EF 74%   Hx of echocardiogram    Echo (2/16):  Mild LVH, EF 60-65%, no RWMA, Gr 2 DD, AVR ok (mean 9 mmHg), trivial AI, mild MR, mild LAE, PASP 40 mmHg   Hypertension    PAD (peripheral artery disease) ([place])    LE Arterial US 10/22: R - pCFA, oSFA, dSFA 30-49; L - pCFA + mSFA 50-74, pPop 30-49 >> referred to [facility]   Pancreatitis    h/o   Thrombocytopenia ([facility]) 11/19/2010    Past Surgical History:  Procedure Laterality Date   ABDOMINAL HYSTERECTOMY     AORTIC VALVE REPLACEMENT [name] 01/17/2014   Procedure: AORTIC VALVE REPLACEMENT (AVR);  Surgeon: Gaye Pollack, MD;  Location: [facility];  Service: Open Heart Surgery;  Laterality: [name];   APPENDECTOMY     CARDIOVERSION [name]  04/04/2021   Procedure: CARDIOVERSION;  Surgeon: [name], DO;  Location: [facility];  Service: Cardiovascular;  Laterality: [name];   CARDIOVERSION [name] 05/08/2021   Procedure: CARDIOVERSION;  Surgeon: [name], DO;  Location: [facility];  Service: Cardiovascular;  Laterality: [name];   CAROTID ENDARTERECTOMY  06/09/11   LEFT  cea   CESAREAN SECTION     FIVE   CHOLECYSTECTOMY     CORONARY ARTERY BYPASS GRAFT [name] 01/17/2014   Procedure: CORONARY ARTERY BYPASS GRAFTING (CABG)TIMES FOUR USING LEFT INTERNAL MAMMARY ARTERY AND RIGHT SAPHENOUS VEIN HARVESTED ENDOSCOPICALLY;  Surgeon: Gaye Pollack, MD;  Location: [facility];  Service: Open Heart Surgery;  Laterality: [name];   ENDARTERECTOMY  06/09/2011   Procedure: ENDARTERECTOMY CAROTID;  Surgeon: Rosetta Posner, MD;  Location: [facility];  Service: Vascular;  Laterality: Left;  left carotid endarterectomy with patch angioplasty   LEFT AND RIGHT HEART CATHETERIZATION WITH CORONARY ANGIOGRAM [name] 01/13/2014   Procedure: LEFT AND RIGHT HEART CATHETERIZATION WITH CORONARY ANGIOGRAM;  Surgeon: Jettie Booze, MD;  Location: [facility] CATH LAB;  Service: Cardiovascular;  Laterality: [name];   TEE WITHOUT CARDIOVERSION [name] 01/17/2014   Procedure: TRANSESOPHAGEAL ECHOCARDIOGRAM (TEE);  Surgeon: Gaye Pollack, MD;  Location: [facility];  Service: Open Heart Surgery;  Laterality: [name];   TEE WITHOUT CARDIOVERSION [name] 04/04/2021   Procedure: TRANSESOPHAGEAL ECHOCARDIOGRAM (TEE);  Surgeon: [name], DO;  Location: [facility];  Service: Cardiovascular;  Laterality: [name];   TONSILLECTOMY      Social:  Per chart review Lives at [facility] Denies smoking ever Reports drinking 0.5 glasses of wine each weekend day Denies ever using illegal drugs PCP Kristie Cowman, MD  Family History:  Family History  Problem Relation Age of Onset   Heart disease Mother        in her 47s   Hypertension Mother    Heart attack Mother    Heart disease Father    Hypertension Father     Hypertension Sister    Hypertension Son    Stroke Paternal Grandmother    Stroke Paternal Grandfather      Allergies: Allergies as of 06/12/2021   (No Known Allergies)    Review of Systems: A complete ROS was negative except as per HPI.   OBJECTIVE:   Physical Exam: Blood pressure (!) 152/57, pulse 64, temperature 98.1 F (36.7 C), temperature source Oral, resp. rate (!) 21, height 5' (1.524 m), weight 41.4 kg, SpO2 94 %.  Constitutional: confused elderly woman sleeping in bed, in no acute distress HEENT: normocephalic atraumatic, PERRL Cardiovascular: irregularly irregular, systolic murmur best heard over the RUSB Pulmonary/Chest: normal work of  breathing on room air, lungs clear to auscultation bilaterally Abdominal: soft, non-tender, non-distended, normal bowel sounds MSK: normal bulk and tone Neurological: sleeping, wakes to sternal rub, withdraws all extremities to pain, intermittently able to squeeze fingers with right hand on command but not left, left facial droop, aphasic, Skin: hematoma with bandage overlying her left dorsal foot  Labs: CBC    Component Value Date/Time   WBC 7.6 06/12/2021 2255   RBC 4.36 06/12/2021 2255   HGB 13.9 06/12/2021 2255   HGB 15.7 05/06/2021 0832   HGB 13.4 12/18/2016 0916   HCT 41.5 06/12/2021 2255   HCT 46.6 05/06/2021 0832   HCT 40.9 12/18/2016 0916   PLT 115 (L) 06/12/2021 2255   PLT 137 (L) 05/06/2021 0832   MCV 95.2 06/12/2021 2255   MCV 93 05/06/2021 0832   MCV 92.3 12/18/2016 0916   MCH 31.9 06/12/2021 2255   MCHC 33.5 06/12/2021 2255   RDW 13.1 06/12/2021 2255   RDW 13.7 05/06/2021 0832   RDW 13.1 12/18/2016 0916   LYMPHSABS 2.7 06/12/2021 2255   LYMPHSABS 4.7 (H) 12/18/2016 0916   MONOABS 0.9 06/12/2021 2255   MONOABS 1.0 (H) 12/18/2016 0916   EOSABS 0.0 06/12/2021 2255   EOSABS 0.1 12/18/2016 0916   BASOSABS 0.0 06/12/2021 2255   BASOSABS 0.1 12/18/2016 0916     CMP     Component Value Date/Time   NA 137  06/12/2021 2255   NA 137 05/06/2021 0832   NA 137 12/18/2016 0916   K 4.0 06/12/2021 2255   K 4.4 12/18/2016 0916   CL 100 06/12/2021 2255   CL 105 05/20/2012 0823   CO2 26 06/12/2021 2255   CO2 25 12/18/2016 0916   GLUCOSE 125 (H) 06/12/2021 2255   GLUCOSE 88 12/18/2016 0916   GLUCOSE 70 05/20/2012 0823   BUN 18 06/12/2021 2255   BUN 20 05/06/2021 0832   BUN 14.0 12/18/2016 0916   CREATININE 1.22 (H) 06/12/2021 2255   CREATININE 1.0 12/18/2016 0916   CALCIUM 9.9 06/12/2021 2255   CALCIUM 9.9 12/18/2016 0916   PROT 5.8 (L) 06/12/2021 2255   PROT 6.1 (L) 12/18/2016 0916   ALBUMIN 3.6 06/12/2021 2255   ALBUMIN 3.9 12/18/2016 0916   AST 27 06/12/2021 2255   AST 28 12/18/2016 0916   ALT 18 06/12/2021 2255   ALT 16 12/18/2016 0916   ALKPHOS 72 06/12/2021 2255   ALKPHOS 54 12/18/2016 0916   BILITOT 1.5 (H) 06/12/2021 2255   BILITOT 1.03 12/18/2016 0916   GFRNONAA 45 (L) 06/12/2021 2255   GFRAA 40 (L) 06/17/2019 0845    Imaging: MR BRAIN WO CONTRAST  Result Date: 06/13/2021 CLINICAL DATA:  Atrial fibrillation.  Acute neurologic deficit. EXAM: MRI HEAD WITHOUT CONTRAST TECHNIQUE: Multiplanar, multiecho pulse sequences of the brain and surrounding structures were obtained without intravenous contrast. COMPARISON:  None Available. FINDINGS: Brain: Small areas of acute or early subacute ischemia within both corona radiata. No acute or chronic hemorrhage. There is multifocal hyperintense T2-weighted signal within the white matter. Generalized cerebral volume loss. The midline structures are normal. Vascular: Major flow voids are preserved. Skull and upper cervical spine: Normal calvarium and skull base. Visualized upper cervical spine and soft tissues are normal. Sinuses/Orbits:No paranasal sinus fluid levels or advanced mucosal thickening. No mastoid or middle ear effusion. Normal orbits. IMPRESSION: Small areas of acute or early subacute ischemia within both corona radiata. No hemorrhage  or mass effect. Electronically Signed   By: Cletus Gash.D.  On: 06/13/2021 02:59   DG Ribs Unilateral W/Chest Right  Result Date: 06/13/2021 CLINICAL DATA:  Right-sided chest pain, initial encounter EXAM: RIGHT RIBS AND CHEST - 3+ VIEW COMPARISON:  05/16/2021 FINDINGS: Cardiac shadow is mildly enlarged. Postsurgical changes are again seen. Left-sided pleural effusion is again noted and stable. Lungs are otherwise clear. No acute rib fracture is noted. No pneumothorax is seen. IMPRESSION: Persistent left-sided pleural effusion. No rib abnormality noted. Electronically Signed   By: Inez Catalina M.D.   On: 06/13/2021 01:03   DG Foot Complete Left  Result Date: 06/13/2021 CLINICAL DATA:  Left foot cellulitis EXAM: LEFT FOOT - COMPLETE 3+ VIEW COMPARISON:  None Available. FINDINGS: Soft tissue swelling is noted over the left foot particularly along the dorsum of the foot. Small calcaneal spur is noted. No acute fracture or dislocation is seen. IMPRESSION: Soft tissue swelling without acute bony abnormality. Electronically Signed   By: Inez Catalina M.D.   On: 06/13/2021 01:02   CT ANGIO HEAD NECK W WO CM W PERF (CODE STROKE)  Result Date: 06/12/2021 CLINICAL DATA:  Acute neurologic deficit EXAM: CT ANGIOGRAPHY HEAD AND NECK CT PERFUSION BRAIN TECHNIQUE: Multidetector CT imaging of the head and neck was performed using the standard protocol during bolus administration of intravenous contrast. Multiplanar CT image reconstructions and MIPs were obtained to evaluate the vascular anatomy. Carotid stenosis measurements (when applicable) are obtained utilizing NASCET criteria, using the distal internal carotid diameter as the denominator. Multiphase CT imaging of the brain was performed following IV bolus contrast injection. Subsequent parametric perfusion maps were calculated using RAPID software. RADIATION DOSE REDUCTION: This exam was performed according to the departmental dose-optimization program which  includes automated exposure control, adjustment of the mA and/or kV according to patient size and/or use of iterative reconstruction technique. CONTRAST:  164m OMNIPAQUE IOHEXOL 350 MG/ML SOLN COMPARISON:  None Available. FINDINGS: CTA NECK FINDINGS SKELETON: There is no bony spinal canal stenosis. No lytic or blastic lesion. OTHER NECK: Normal pharynx, larynx and major salivary glands. No cervical lymphadenopathy. Unremarkable thyroid gland. UPPER CHEST: Small left pleural effusion AORTIC ARCH: There is calcific atherosclerosis of the aortic arch. There is no aneurysm, dissection or hemodynamically significant stenosis of the visualized portion of the aorta. Conventional 3 vessel aortic branching pattern. The visualized proximal subclavian arteries are widely patent. RIGHT CAROTID SYSTEM: No dissection, occlusion or aneurysm. Mild atherosclerotic calcification at the carotid bifurcation without hemodynamically significant stenosis. LEFT CAROTID SYSTEM: Multifocal atherosclerotic calcification without hemodynamically significant stenosis. VERTEBRAL ARTERIES: Left dominant configuration. Both origins are clearly patent. There is no dissection, occlusion or flow-limiting stenosis to the skull base (V1-V3 segments). CTA HEAD FINDINGS POSTERIOR CIRCULATION: --Vertebral arteries: The right vertebral artery terminates in PICA. There is mild atherosclerosis of the left without stenosis. --Inferior cerebellar arteries: Normal. --Basilar artery: Normal. --Superior cerebellar arteries: Normal. --Posterior cerebral arteries (PCA): Moderate narrowing of the distal left P2 segment. ANTERIOR CIRCULATION: --Intracranial internal carotid arteries: Atherosclerotic calcification of the internal carotid arteries at the skull base without hemodynamically significant stenosis. --Anterior cerebral arteries (ACA): Normal. Both A1 segments are present. Patent anterior communicating artery (a-comm). --Middle cerebral arteries (MCA):  Normal. VENOUS SINUSES: As permitted by contrast timing, patent. ANATOMIC VARIANTS: None Review of the MIP images confirms the above findings. CT Brain Perfusion Findings: Perfusion images are degraded by motion but there is no visible perfusion deficit. IMPRESSION: 1. No emergent large vessel occlusion. 2. Moderate narrowing of the distal left PCA P2 segment. 3. Motion degraded CTP without visible  perfusion deficit. 4. Small left pleural effusion. 5. Aortic Atherosclerosis (ICD10-I70.0). Electronically Signed   By: Ulyses Jarred M.D.   On: 06/12/2021 23:37   CT HEAD CODE STROKE WO CONTRAST  Result Date: 06/12/2021 CLINICAL DATA:  Code stroke.  Acute neurologic deficit. EXAM: CT HEAD WITHOUT CONTRAST TECHNIQUE: Contiguous axial images were obtained from the base of the skull through the vertex without intravenous contrast. RADIATION DOSE REDUCTION: This exam was performed according to the departmental dose-optimization program which includes automated exposure control, adjustment of the mA and/or kV according to patient size and/or use of iterative reconstruction technique. COMPARISON:  None Available. FINDINGS: Brain: There is no mass, hemorrhage or extra-axial collection. There is generalized atrophy without lobar predilection. There is hypoattenuation of the periventricular white matter, most commonly indicating chronic ischemic microangiopathy. Old right basal ganglia small vessel infarct. Vascular: No abnormal hyperdensity of the major intracranial arteries or dural venous sinuses. No intracranial atherosclerosis. Skull: The visualized skull base, calvarium and extracranial soft tissues are normal. Sinuses/Orbits: No fluid levels or advanced mucosal thickening of the visualized paranasal sinuses. No mastoid or middle ear effusion. The orbits are normal. ASPECTS [facility] Stroke Program Early CT Score) - Ganglionic level infarction (caudate, lentiform nuclei, internal capsule, insula, M1-M3 cortex): 7 -  Supraganglionic infarction (M4-M6 cortex): 3 Total score (0-10 with 10 being normal): 10 IMPRESSION: 1. No acute intracranial abnormality. 2. ASPECTS is 10. These results were called by telephone at the time of interpretation on 06/12/2021 at 11:06 pm to provider Dr. Curly Shores, who verbally acknowledged these results. Electronically Signed   By: Ulyses Jarred M.D.   On: 06/12/2021 23:07    EKG: personally reviewed my interpretation is sinus rhythm with PACs and PVCs   ASSESSMENT & PLAN:    Assessment & Plan by Problem: Principal Problem:   CVA (cerebral vascular accident) ([place])   AUBREA MEIXNER is a 81 y.o. with pertinent PMH of atrial fibrillation on Eliquis, aortic stenosis s/p prosthetic valve replacement, CAD s/p CABG, carotid artery disease s/p CEA, PAD, hyperlipidemia, CLL, and chronic thrombocytopenia and a recent admission for a TIA admitted with a new acute CVA.  #Acute Bilateral Small Corona Radiata CVAs Patient recently admitted for TIA and underwent stroke workup in May. MRI at that time was negative for CVA. At that time the question of Eliquis failure was raised given she was compliant with taking it but the decision was made to continue eliquis rather than Xarelto. Given this repeat episode it is safe to say that she has failed eliquis and should be switched to Xarelto moving forward. She is fairly somnolent and non-participatory on exam in the setting of a dose of Ativan in the ED so will need to reevaluate her status once she is more awake. Will keep her NPO until she can be evaluated by SLP. Unfortunately in the ED she was given a dose of hydralazine at 0400 despite the MRI read being positive for CVA at 0300 which goes against permissive HTN. BP has now dropped below 160 is and running in the 150s. Will hold off on further doses of BP medication. Neurology is also following. Patient previously on low dose statin therapy as LDL <70, however this appears to have been discontinued at last  admission - holding eliquis in setting of acute CVA, plan to switch to xarelto - Permissive HTN - PT OT consult - NPO pending SLP evaluation - consider restarting statin - Neurology following appreciate assistance  #Atrial fibrillation on Eliquis Patient is  currently rate controlled, will hold off on cardizem due to permissive HTN window. Also plan to switch to Xarelto as above due to eliquis failure. Currently in sinus rhythm - hold eliquis and xarelto - cardiac monitoring  #HTN Holding cardizem in setting of permissive HTN  #Foot hematoma In the ED this was attempted to be drained and she was put on antibiotics for presumed cellulitis. Do not suspect cellulitis at this time, appears to be traumatic in nature with swelling related to bruising from anticoagulation. - CTM  #CKD Baseline GFR appears to be about 50. Given recent IV contrast will continue to monitor closely - avoid nephrotoxic agents - daily BMP  #CAD s/p CABG in 2016; PAD; HLD; Aortic atherosclerosis #Carotid artery disease s/p CEA in 2013 Consider restarting statin therapy  #Chronic thrombocytopenia Chronic and stable, low at 115 on admission - daily CBC  #Aortic stenosis s/p prosthetic valve replacement in 2016  Diet: NPO VTE: None IVF: None,None Code: DNR  Prior to Admission Living Arrangement:  [facility] Anticipated Discharge Location:  [facility] Barriers to Discharge: continued medical workup   Dispo: Admit patient to Observation with expected length of stay less than 2 midnights.  Signed: Scarlett Presto, MD Internal Medicine Resident PGY-1   06/13/2021, 5:22 AM

## 2021-06-13 NOTE — Progress Notes (Signed)
PT Cancellation Note  Patient Details Name: Nicole Mercado MRN: [id number] DOB: [birth year]   Cancelled Treatment:    Reason Eval/Treat Not Completed: Medical issues which prohibited therapy RN requesting to hold secondary to HR. Will follow up as schedule allows.   Nicole Mercado, DPT  Acute Rehabilitation Services  Office: [phone]    Rudean Hitt 06/13/2021, 10:18 AM

## 2021-06-13 NOTE — ED Notes (Signed)
[phone] pt son would like an update!

## 2021-06-13 NOTE — Progress Notes (Addendum)
STROKE TEAM PROGRESS NOTE   INTERVAL HISTORY On eliquis and cardizem at baseline. In afib with RVR and on amiodarone now.  On exam she is confused, moving all extremities. Speech is garbled/word salad.  MRI scan of the brain shows bilateral mild corona radiata infarcts. Vitals:   06/13/21 0745 06/13/21 0800 06/13/21 0830 06/13/21 1000  BP: (!) 232/72 (!) 164/60 (!) 130/92 (!) 162/73  Pulse: 70 76 79 (!) 145  Resp: (!) '26 15 15 16  '$ Temp:      TempSrc:      SpO2: 95% 98% 97% 97%  Weight:      Height:       CBC:  Recent Labs  Lab 06/12/21 2254 06/12/21 2255  WBC  --  7.6  NEUTROABS  --  4.0  HGB 14.3 13.9  HCT 42.0 41.5  MCV  --  95.2  PLT  --  585*   Basic Metabolic Panel:  Recent Labs  Lab 06/12/21 2254 06/12/21 2255  NA 135 137  K 4.1 4.0  CL 100 100  CO2  --  26  GLUCOSE 117* 125*  BUN 22 18  CREATININE 1.00 1.22*  CALCIUM  --  9.9   Lipid Panel: No results for input(s): "CHOL", "TRIG", "HDL", "CHOLHDL", "VLDL", "LDLCALC" in the last 168 hours. HgbA1c: No results for input(s): "HGBA1C" in the last 168 hours. Urine Drug Screen:  Recent Labs  Lab 06/13/21 0541  LABOPIA NONE DETECTED  COCAINSCRNUR NONE DETECTED  LABBENZ NONE DETECTED  AMPHETMU NONE DETECTED  THCU NONE DETECTED  LABBARB NONE DETECTED    Alcohol Level No results for input(s): "ETH" in the last 168 hours.  IMAGING past 24 hours DG CHEST PORT 1 VIEW  Result Date: 06/13/2021 CLINICAL DATA:  Code stroke EXAM: PORTABLE CHEST 1 VIEW COMPARISON:  06/13/2021 FINDINGS: Persistent left pleural effusion with adjacent atelectasis. Stable cardiomediastinal contours. Post aortic valve replacement. IMPRESSION: Persistent left pleural effusion with adjacent atelectasis. Electronically Signed   By: Macy Mis M.D.   On: 06/13/2021 10:42   MR BRAIN WO CONTRAST  Result Date: 06/13/2021 CLINICAL DATA:  Atrial fibrillation.  Acute neurologic deficit. EXAM: MRI HEAD WITHOUT CONTRAST TECHNIQUE: Multiplanar,  multiecho pulse sequences of the brain and surrounding structures were obtained without intravenous contrast. COMPARISON:  None Available. FINDINGS: Brain: Small areas of acute or early subacute ischemia within both corona radiata. No acute or chronic hemorrhage. There is multifocal hyperintense T2-weighted signal within the white matter. Generalized cerebral volume loss. The midline structures are normal. Vascular: Major flow voids are preserved. Skull and upper cervical spine: Normal calvarium and skull base. Visualized upper cervical spine and soft tissues are normal. Sinuses/Orbits:No paranasal sinus fluid levels or advanced mucosal thickening. No mastoid or middle ear effusion. Normal orbits. IMPRESSION: Small areas of acute or early subacute ischemia within both corona radiata. No hemorrhage or mass effect. Electronically Signed   By: Ulyses Jarred M.D.   On: 06/13/2021 02:59   DG Ribs Unilateral W/Chest Right  Result Date: 06/13/2021 CLINICAL DATA:  Right-sided chest pain, initial encounter EXAM: RIGHT RIBS AND CHEST - 3+ VIEW COMPARISON:  05/16/2021 FINDINGS: Cardiac shadow is mildly enlarged. Postsurgical changes are again seen. Left-sided pleural effusion is again noted and stable. Lungs are otherwise clear. No acute rib fracture is noted. No pneumothorax is seen. IMPRESSION: Persistent left-sided pleural effusion. No rib abnormality noted. Electronically Signed   By: Inez Catalina M.D.   On: 06/13/2021 01:03   DG Foot Complete Left  Result  Date: 06/13/2021 CLINICAL DATA:  Left foot cellulitis EXAM: LEFT FOOT - COMPLETE 3+ VIEW COMPARISON:  None Available. FINDINGS: Soft tissue swelling is noted over the left foot particularly along the dorsum of the foot. Small calcaneal spur is noted. No acute fracture or dislocation is seen. IMPRESSION: Soft tissue swelling without acute bony abnormality. Electronically Signed   By: Inez Catalina M.D.   On: 06/13/2021 01:02   CT ANGIO HEAD NECK W WO CM W PERF  (CODE STROKE)  Result Date: 06/12/2021 CLINICAL DATA:  Acute neurologic deficit EXAM: CT ANGIOGRAPHY HEAD AND NECK CT PERFUSION BRAIN TECHNIQUE: Multidetector CT imaging of the head and neck was performed using the standard protocol during bolus administration of intravenous contrast. Multiplanar CT image reconstructions and MIPs were obtained to evaluate the vascular anatomy. Carotid stenosis measurements (when applicable) are obtained utilizing NASCET criteria, using the distal internal carotid diameter as the denominator. Multiphase CT imaging of the brain was performed following IV bolus contrast injection. Subsequent parametric perfusion maps were calculated using RAPID software. RADIATION DOSE REDUCTION: This exam was performed according to the departmental dose-optimization program which includes automated exposure control, adjustment of the mA and/or kV according to patient size and/or use of iterative reconstruction technique. CONTRAST:  173m OMNIPAQUE IOHEXOL 350 MG/ML SOLN COMPARISON:  None Available. FINDINGS: CTA NECK FINDINGS SKELETON: There is no bony spinal canal stenosis. No lytic or blastic lesion. OTHER NECK: Normal pharynx, larynx and major salivary glands. No cervical lymphadenopathy. Unremarkable thyroid gland. UPPER CHEST: Small left pleural effusion AORTIC ARCH: There is calcific atherosclerosis of the aortic arch. There is no aneurysm, dissection or hemodynamically significant stenosis of the visualized portion of the aorta. Conventional 3 vessel aortic branching pattern. The visualized proximal subclavian arteries are widely patent. RIGHT CAROTID SYSTEM: No dissection, occlusion or aneurysm. Mild atherosclerotic calcification at the carotid bifurcation without hemodynamically significant stenosis. LEFT CAROTID SYSTEM: Multifocal atherosclerotic calcification without hemodynamically significant stenosis. VERTEBRAL ARTERIES: Left dominant configuration. Both origins are clearly patent.  There is no dissection, occlusion or flow-limiting stenosis to the skull base (V1-V3 segments). CTA HEAD FINDINGS POSTERIOR CIRCULATION: --Vertebral arteries: The right vertebral artery terminates in PICA. There is mild atherosclerosis of the left without stenosis. --Inferior cerebellar arteries: Normal. --Basilar artery: Normal. --Superior cerebellar arteries: Normal. --Posterior cerebral arteries (PCA): Moderate narrowing of the distal left P2 segment. ANTERIOR CIRCULATION: --Intracranial internal carotid arteries: Atherosclerotic calcification of the internal carotid arteries at the skull base without hemodynamically significant stenosis. --Anterior cerebral arteries (ACA): Normal. Both A1 segments are present. Patent anterior communicating artery (a-comm). --Middle cerebral arteries (MCA): Normal. VENOUS SINUSES: As permitted by contrast timing, patent. ANATOMIC VARIANTS: None Review of the MIP images confirms the above findings. CT Brain Perfusion Findings: Perfusion images are degraded by motion but there is no visible perfusion deficit. IMPRESSION: 1. No emergent large vessel occlusion. 2. Moderate narrowing of the distal left PCA P2 segment. 3. Motion degraded CTP without visible perfusion deficit. 4. Small left pleural effusion. 5. Aortic Atherosclerosis (ICD10-I70.0). Electronically Signed   By: KUlyses JarredM.D.   On: 06/12/2021 23:37   CT HEAD CODE STROKE WO CONTRAST  Result Date: 06/12/2021 CLINICAL DATA:  Code stroke.  Acute neurologic deficit. EXAM: CT HEAD WITHOUT CONTRAST TECHNIQUE: Contiguous axial images were obtained from the base of the skull through the vertex without intravenous contrast. RADIATION DOSE REDUCTION: This exam was performed according to the departmental dose-optimization program which includes automated exposure control, adjustment of the mA and/or kV according to patient size  and/or use of iterative reconstruction technique. COMPARISON:  None Available. FINDINGS: Brain:  There is no mass, hemorrhage or extra-axial collection. There is generalized atrophy without lobar predilection. There is hypoattenuation of the periventricular white matter, most commonly indicating chronic ischemic microangiopathy. Old right basal ganglia small vessel infarct. Vascular: No abnormal hyperdensity of the major intracranial arteries or dural venous sinuses. No intracranial atherosclerosis. Skull: The visualized skull base, calvarium and extracranial soft tissues are normal. Sinuses/Orbits: No fluid levels or advanced mucosal thickening of the visualized paranasal sinuses. No mastoid or middle ear effusion. The orbits are normal. ASPECTS [facility] Stroke Program Early CT Score) - Ganglionic level infarction (caudate, lentiform nuclei, internal capsule, insula, M1-M3 cortex): 7 - Supraganglionic infarction (M4-M6 cortex): 3 Total score (0-10 with 10 being normal): 10 IMPRESSION: 1. No acute intracranial abnormality. 2. ASPECTS is 10. These results were called by telephone at the time of interpretation on 06/12/2021 at 11:06 pm to provider Dr. Curly Shores, who verbally acknowledged these results. Electronically Signed   By: Ulyses Jarred M.D.   On: 06/12/2021 23:07    PHYSICAL EXAM Physical Exam  Constitutional: Appears well-developed and well-nourished.  Elderly Caucasian lady Cardiovascular: Irregularly irregular Respiratory: Effort normal, non-labored breathing   Neuro: Mental Status: Patient is awake, alert.  Disoriented to time place and person.  Diminished attention, registration and recall. Unable to give any significant history.   Largely nonsensical speech. Minimally follow commands. Does not repeat or name objects on request  Cranial Nerves: II: Visual Fields are full to orienting to stimuli in all quadrants. Pupils are equal, round, and reactive to light.   III,IV, VI: EOMI, tracking examiner V: Facial sensation is symmetric VII: Facial movement is symmetric.  VIII: hearing is intact  to voice X: Uvula elevates symmetrically XI: Shoulder shrug is symmetric. XII: tongue is midline without atrophy or fasciculations.  Motor: Tone is normal. Bulk is normal.  Confrontational strength testing limited by her confusion. Appears to have some right wrist drift, but moves BUE antigravity.  Will not hold BLE to commands but strength appears to be grossly equal. Sensory: Equally reactive to light noxious stimulus in all 4 extremities Plantar reflexes: Upgoing bilaterally Cerebellar: FNF and HKS are intact bilaterally  ASSESSMENT/PLAN Ms. ALPA SALVO is a 81 y.o. 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 CTA head & neck Moderate narrowing of the distal left PCA P2 segment. MRI  small infarcts in the bilateral corona radiata 2D Echo EF 55-60% TEE 04/04/2021- A left atrial/left atrial appendage thrombus was detected LDL 63 HgbA1c 5.0 VTE prophylaxis - SCDs    Diet   Diet NPO time specified   Eliquis (apixaban) daily prior to admission, now on No antithrombotic. Resume eliquis when medically able. 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 MD to update note as needed.   Janine Ores, DNP, FNP-BC [facility] Pager: [phone]  STROKE MD NOTE :  I have personally obtained history,examined this patient, reviewed notes, independently viewed imaging studies, participated in medical decision making and plan of care.ROS completed by me personally and pertinent positives fully documented  I have made any additions or clarifications directly to the above note. Agree with note above.  Patient presented with confusion and mental status changes and MRI shows bilateral corona radiata infarct.  This could be cardioembolic from A-fib detected on anticoagulation but the patient and subcortical nature suggestive of small vessel disease as well.  I do not think there is data suggesting changing Eliquis to Pradaxa or Xarelto or adding aspirin especially if any benefit.  Check swallow eval patient is unable to swallow was put on IV heparin.  Long discussion with the patient's son and daughter-in-law over the phone and they are agreeable with the plan to switch to Pradaxa if patient's insurance will allow it.  We will need to go back to assisted living or skilled nursing facility for further rehabilitation needs.  Greater than 50% time during this 50-minute visit was spent on counseling and coordination of care about her strokes and dysphagia and answering questions.  Antony Contras, MD Medical Director [place] Pager: [phone] 06/13/2021 12:52 PM   To contact Stroke Continuity provider, please refer to [email or web address] After hours, contact General Neurology

## 2021-06-13 NOTE — ED Notes (Signed)
Admitting paged to RN per her request 

## 2021-06-13 NOTE — ED Notes (Signed)
Pt in A Fib RVR-- MD notified

## 2021-06-13 NOTE — TOC CAGE-AID Note (Signed)
Transition of Care [facility]) - CAGE-AID Screening   Patient Details  Name: Nicole Mercado MRN: [id number] Date of Birth: [birth year]  [facility]) CM/SW Contact:    Loreta Blouch C Tarpley-Carter, [place] Phone Number: 06/13/2021, 11:38 AM   Clinical Narrative: Pt is unable to participate in Cage Aid. Pt is critically ill.  Sarha Bartelt Tarpley-Carter, MSW, LCSW-A Pronouns:  She/Her/Hers [facility] Clinical Social Worker Direct Number:  [phone] [email or web address]  CAGE-AID Screening: Substance Abuse Screening unable to be completed due to: : Patient unable to participate

## 2021-06-13 NOTE — Progress Notes (Signed)
ANTICOAGULATION CONSULT NOTE - Initial Consult  Pharmacy Consult for Heparin Indication: atrial fibrillation, left atrial thrombus seen on TEE   No Known Allergies  Patient Measurements: Height: 5' (152.4 cm) Weight: 41.4 kg (91 lb 4.3 oz) IBW/kg (Calculated) : 45.5 Heparin Dosing Weight:  41 kg  Vital Signs: Temp: 98.2 F (36.8 C) (06/08 2102) Temp Source: Oral (06/08 2102) BP: 177/100 (06/08 2102) Pulse Rate: 126 (06/08 2102)  Labs: Recent Labs    06/12/21 2254 06/12/21 2255 06/13/21 2050  HGB 14.3 13.9  --   HCT 42.0 41.5  --   PLT  --  115*  --   APTT  --  26 24  LABPROT  --  15.4*  --   INR  --  1.2  --   HEPARINUNFRC  --   --  >1.10*  CREATININE 1.00 1.22*  --      Estimated Creatinine Clearance: 24 mL/min (A) (by C-G formula based on SCr of 1.22 mg/dL (H)).   Medical History: Past Medical History:  Diagnosis Date   Aortic stenosis    a. Echocardiogram (01/11/14):  Mild LVH, EF 60-65%, Gr 1 DD, possible bicuspid AV, severe AS (mean 61 mmHg, peak 104 mmHg), mild MVP of post leaflet, mild MR, PASP 33 mmHg.;  b. 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 LHC (01/13/14):  dLM 70 extending into oLAD, mLAD 40-50, pD1 80, pD2 70, ostial/prox OM1 70 >> CABG (L-LAD, S-OM1, S-D1, S-D2)  // Myoview 10/22: Fixed defect anterolateral wall consistent with artifact, EF 71, no ischemia, low risk   Carotid artery occlusion    a. s/p L CEA 2013;  b.  Carotid US (1/16):  Bilateral ICA 1-39% // Carotid US 10/22: Bilateral ICA 1-39   CLL (chronic lymphocytic leukemia) (HCC)    slow leukemia---Dr  Murinson   H/O exercise stress test    NSSTT   Hx of cardiovascular stress test    a. Nuclear stress test That (4/13): Normal perfusion, EF 74%   Hx of echocardiogram    Echo (2/16):  Mild LVH, EF 60-65%, no RWMA, Gr 2 DD, AVR ok (mean 9 mmHg), trivial AI, mild MR, mild LAE, PASP  40 mmHg   Hypertension    PAD (peripheral artery disease) ([place])    LE Arterial US 10/22: R - pCFA, oSFA, dSFA 30-49; L - pCFA + mSFA 50-74, pPop 30-49 >> referred to [facility]   Pancreatitis    h/o   Thrombocytopenia ([place]) 11/19/2010    Medications:  Await electronic med rec  Assessments: 81 y.o. F presents with aphasia. Pt on apixaban PTA for h/o afib, L atrial appendage thrombus on TEE in March. Last dose 6/7 2000. Pt now with acute CVA. Pt having trouble swallowing so Eliquis on hold. Neurology would like to start low-dose heparin gtt until oral AC can be restarted. Baseline aPTT 26 sec. H/H wnl. Plt 115 (120-140s over past month) Apixaban likely affecting heparin level so will utilize aPTT for monitoring until levels correlate.  PTT came back low tonight. HL elevated due to apixaban. No drip issue per Rn. We will increase rate and check level in AM.  Goal of Therapy:  aPTT 66-85 seconds; heparin level 0.3-0.5 units/ml Monitor platelets by anticoagulation protocol: Yes   Plan:  Increase heparin infusion to 600 units/hr Will f/u aPTT and heparin level in AM  Daily heparin level and aPTT  Onnie Boer, PharmD, [place], AAHIVP, CPP Infectious Disease Pharmacist 06/13/2021 9:48 PM

## 2021-06-13 NOTE — ED Notes (Signed)
ED TO INPATIENT HANDOFF REPORT  ED Nurse Name and Phone #: [phone] [place] Name/Age/Gender Nicole Mercado 81 y.o. female Room/Bed: RESUSC/RESUSC  Code Status   Code Status: DNR  Home/SNF/Other Skilled nursing facility Patient oriented to: self Is this baseline? No   Triage Complete: Triage complete  Chief Complaint CVA (cerebral vascular accident) [facility]) [I63.9]  Triage Note Per EMS, LKW 1700 by staff from [facility], found by staff at 2200 w/ her pants off (covered in urine) and incomprehensible speech all unusual for pt.  Facility documentation states she has "cognitive issues".    224/82 CBG 121 HR 70 97% RA     Allergies No Known Allergies  Level of Care/Admitting Diagnosis ED Disposition     ED Disposition  Admit   Condition  --   Comment  Hospital Area: [facility] [[id number]]  Level of Care: Progressive [102]  Admit to Progressive based on following criteria: NEUROLOGICAL AND NEUROSURGICAL complex patients with significant risk of instability, who do not meet ICU criteria, yet require close observation or frequent assessment (< / = every 2 - 4 hours) with medical / nursing intervention.  May admit patient to [facility] or [facility] if equivalent level of care is available:: Yes  Covid Evaluation: Confirmed COVID Negative  Diagnosis: CVA (cerebral vascular accident) [facility]) [[id number]]  Admitting Physician: [name] [[id number]]  Attending Physician: [name] [[id number]]  Estimated length of stay: past midnight tomorrow  Certification:: I certify this patient will need inpatient services for at least 2 midnights          B Medical/Surgery History Past Medical History:  Diagnosis Date   Aortic stenosis    a. Echocardiogram (01/11/14):  Mild LVH, EF 60-65%, Gr 1 DD, possible bicuspid AV, severe AS (mean 61 mmHg, peak 104 mmHg), mild MVP of post leaflet, mild MR, PASP 33 mmHg.;  b. 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 LHC (01/13/14):  dLM 70 extending into oLAD, mLAD 40-50, pD1 80, pD2 70, ostial/prox OM1 70 >> CABG (L-LAD, S-OM1, S-D1, S-D2)  // Myoview 10/22: Fixed defect anterolateral wall consistent with artifact, EF 71, no ischemia, low risk   Carotid artery occlusion    a. s/p L CEA 2013;  b.  Carotid US (1/16):  Bilateral ICA 1-39% // Carotid US 10/22: Bilateral ICA 1-39   CLL (chronic lymphocytic leukemia) (HCC)    slow leukemia---Dr  Murinson   H/O exercise stress test    NSSTT   Hx of cardiovascular stress test    a. Nuclear stress test That (4/13): Normal perfusion, EF 74%   Hx of echocardiogram    Echo (2/16):  Mild LVH, EF 60-65%, no RWMA, Gr 2 DD, AVR ok (mean 9 mmHg), trivial AI, mild MR, mild LAE, PASP 40 mmHg   Hypertension    PAD (peripheral artery disease) ([place])    LE Arterial US 10/22: R - pCFA, oSFA, dSFA 30-49; L - pCFA + mSFA 50-74, pPop 30-49 >> referred to [facility]   Pancreatitis    h/o   Thrombocytopenia ([place]) 11/19/2010   Past Surgical History:  Procedure Laterality Date   ABDOMINAL HYSTERECTOMY     AORTIC VALVE REPLACEMENT [name] 01/17/2014   Procedure: AORTIC VALVE REPLACEMENT (AVR);  Surgeon: Gaye Pollack, MD;  Location: [place];  Service: Open Heart Surgery;  Laterality: [name];   APPENDECTOMY     CARDIOVERSION [name] 04/04/2021   Procedure: CARDIOVERSION;  Surgeon: [name], DO;  Location: [place] [facility];  Service: Cardiovascular;  Laterality: [name];   CARDIOVERSION [name] 05/08/2021   Procedure: CARDIOVERSION;  Surgeon: [name], DO;  Location: [place];  Service: Cardiovascular;  Laterality: [name];   CAROTID ENDARTERECTOMY  06/09/11   LEFT  cea   CESAREAN SECTION     FIVE   CHOLECYSTECTOMY     CORONARY ARTERY BYPASS GRAFT [name] 01/17/2014   Procedure: CORONARY ARTERY BYPASS GRAFTING (CABG)TIMES FOUR USING LEFT INTERNAL MAMMARY ARTERY AND RIGHT SAPHENOUS VEIN HARVESTED  ENDOSCOPICALLY;  Surgeon: Gaye Pollack, MD;  Location: [place];  Service: Open Heart Surgery;  Laterality: [name];   ENDARTERECTOMY  06/09/2011   Procedure: ENDARTERECTOMY CAROTID;  Surgeon: Rosetta Posner, MD;  Location: [place];  Service: Vascular;  Laterality: Left;  left carotid endarterectomy with patch angioplasty   LEFT AND RIGHT HEART CATHETERIZATION WITH CORONARY ANGIOGRAM [name] 01/13/2014   Procedure: LEFT AND RIGHT HEART CATHETERIZATION WITH CORONARY ANGIOGRAM;  Surgeon: Jettie Booze, MD;  Location: [facility] CATH LAB;  Service: Cardiovascular;  Laterality: [name];   TEE WITHOUT CARDIOVERSION [name] 01/17/2014   Procedure: TRANSESOPHAGEAL ECHOCARDIOGRAM (TEE);  Surgeon: Gaye Pollack, MD;  Location: [place];  Service: Open Heart Surgery;  Laterality: [name];   TEE WITHOUT CARDIOVERSION [name] 04/04/2021   Procedure: TRANSESOPHAGEAL ECHOCARDIOGRAM (TEE);  Surgeon: [name], DO;  Location: [facility];  Service: Cardiovascular;  Laterality: [name];   TONSILLECTOMY       A IV Location/Drains/Wounds Patient Lines/Drains/Airways Status     Active Line/Drains/Airways     Name Placement date Placement time Site Days   Peripheral IV 05/17/21 20 G 1.88" Left Antecubital 05/17/21  1049  Antecubital  27   Peripheral IV 06/13/21 22 G Anterior;Left;Upper Arm 06/13/21  1315  Arm  less than 1            Intake/Output Last 24 hours No intake or output data in the 24 hours ending 06/13/21 1548  Labs/Imaging Results for orders placed or performed during the hospital encounter of 06/12/21 (from the past 48 hour(s))  CBG monitoring, ED     Status: Abnormal   Collection Time: 06/12/21 10:53 PM  Result Value Ref Range   Glucose-Capillary 109 (H) 70 - 99 mg/dL    Comment: Glucose reference range applies only to samples taken after fasting for at least 8 hours.  I-stat chem 8, ED     Status: Abnormal   Collection Time: 06/12/21 10:54 PM  Result Value Ref Range   Sodium 135 135 - 145 mmol/L   Potassium 4.1 3.5 -  5.1 mmol/L   Chloride 100 98 - 111 mmol/L   BUN 22 8 - 23 mg/dL   Creatinine, Ser 1.00 0.44 - 1.00 mg/dL   Glucose, Bld 117 (H) 70 - 99 mg/dL    Comment: Glucose reference range applies only to samples taken after fasting for at least 8 hours.   Calcium, Ion 1.13 (L) 1.15 - 1.40 mmol/L   TCO2 25 22 - 32 mmol/L   Hemoglobin 14.3 12.0 - 15.0 g/dL   HCT 42.0 36.0 - 46.0 %  Protime-INR     Status: Abnormal   Collection Time: 06/12/21 10:55 PM  Result Value Ref Range   Prothrombin Time 15.4 (H) 11.4 - 15.2 seconds   INR 1.2 0.8 - 1.2    Comment: (NOTE) INR goal varies based on device and disease  states. Performed at [facility], [place] [address] [place], [place] [address]   APTT     Status: None   Collection Time: 06/12/21 10:55 PM  Result Value Ref Range   aPTT 26 24 - 36 seconds    Comment: Performed at [place] [address] [place], [place] [address]  CBC     Status: Abnormal   Collection Time: 06/12/21 10:55 PM  Result Value Ref Range   WBC 7.6 4.0 - 10.5 K/uL   RBC 4.36 3.87 - 5.11 MIL/uL   Hemoglobin 13.9 12.0 - 15.0 g/dL   HCT 41.5 36.0 - 46.0 %   MCV 95.2 80.0 - 100.0 fL   MCH 31.9 26.0 - 34.0 pg   MCHC 33.5 30.0 - 36.0 g/dL   RDW 13.1 11.5 - 15.5 %   Platelets 115 (L) 150 - 400 K/uL    Comment: Immature Platelet Fraction may be clinically indicated, consider ordering this additional test [id number] REPEATED TO VERIFY PLATELET COUNT CONFIRMED BY SMEAR    nRBC 0.0 0.0 - 0.2 %    Comment: Performed at [facility], [place] [address], [place], [place] [address]  Differential     Status: None   Collection Time: 06/12/21 10:55 PM  Result Value Ref Range   Neutrophils Relative % 53 %   Neutro Abs 4.0 1.7 - 7.7 K/uL   Lymphocytes Relative 35 %   Lymphs Abs 2.7 0.7 - 4.0 K/uL   Monocytes Relative 11 %   Monocytes Absolute 0.9 0.1 - 1.0 K/uL   Eosinophils Relative 0 %   Eosinophils Absolute 0.0 0.0 - 0.5 K/uL   Basophils Relative 1 %    Basophils Absolute 0.0 0.0 - 0.1 K/uL   WBC Morphology MORPHOLOGY UNREMARKABLE    RBC Morphology MORPHOLOGY UNREMARKABLE    Smear Review PLATELET COUNT CONFIRMED BY SMEAR    Immature Granulocytes 0 %   Abs Immature Granulocytes 0.01 0.00 - 0.07 K/uL    Comment: Performed at [facility], [place] [address], [place], [place] [address]  Comprehensive metabolic panel     Status: Abnormal   Collection Time: 06/12/21 10:55 PM  Result Value Ref Range   Sodium 137 135 - 145 mmol/L   Potassium 4.0 3.5 - 5.1 mmol/L   Chloride 100 98 - 111 mmol/L   CO2 26 22 - 32 mmol/L   Glucose, Bld 125 (H) 70 - 99 mg/dL    Comment: Glucose reference range applies only to samples taken after fasting for at least 8 hours.   BUN 18 8 - 23 mg/dL   Creatinine, Ser 1.22 (H) 0.44 - 1.00 mg/dL   Calcium 9.9 8.9 - 10.3 mg/dL   Total Protein 5.8 (L) 6.5 - 8.1 g/dL   Albumin 3.6 3.5 - 5.0 g/dL   AST 27 15 - 41 U/L   ALT 18 0 - 44 U/L   Alkaline Phosphatase 72 38 - 126 U/L   Total Bilirubin 1.5 (H) 0.3 - 1.2 mg/dL   GFR, Estimated 45 (L) >60 mL/min    Comment: (NOTE) Calculated using the CKD-EPI Creatinine Equation (2021)    Anion gap 11 5 - 15    Comment: Performed at [facility] [address], [place], [place] [address]  Aerobic Culture w Gram Stain (superficial specimen)     Status: None (Preliminary result)   Collection Time: 06/13/21  4:15 AM   Specimen: Wound; Abscess  Result Value Ref Range  Specimen Description WOUND    Special Requests ABSC    Gram Stain      RARE WBC PRESENT,BOTH PMN AND MONONUCLEAR NO ORGANISMS SEEN Performed at [facility], [place] [address], [place], [place] [address]    Culture PENDING    Report Status PENDING   Urinalysis, Routine w reflex microscopic Urine, Clean Catch     Status: Abnormal   Collection Time: 06/13/21  5:40 AM  Result Value Ref Range   Color, Urine YELLOW YELLOW   APPearance CLEAR CLEAR   Specific Gravity, Urine 1.036 (H) 1.005 - 1.030    pH 7.0 5.0 - 8.0   Glucose, UA NEGATIVE NEGATIVE mg/dL   Hgb urine dipstick NEGATIVE NEGATIVE   Bilirubin Urine NEGATIVE NEGATIVE   Ketones, ur NEGATIVE NEGATIVE mg/dL   Protein, ur 30 (A) NEGATIVE mg/dL   Nitrite NEGATIVE NEGATIVE   Leukocytes,Ua NEGATIVE NEGATIVE   RBC / HPF 0-5 0 - 5 RBC/hpf   WBC, UA 0-5 0 - 5 WBC/hpf   Bacteria, UA RARE (A) NONE SEEN   Squamous Epithelial / LPF 0-5 0 - 5   Mucus PRESENT     Comment: Performed at [facility], [place] [address], [place], [place] [address]  Rapid urine drug screen (hospital performed)     Status: None   Collection Time: 06/13/21  5:41 AM  Result Value Ref Range   Opiates NONE DETECTED NONE DETECTED   Cocaine NONE DETECTED NONE DETECTED   Benzodiazepines NONE DETECTED NONE DETECTED   Amphetamines NONE DETECTED NONE DETECTED   Tetrahydrocannabinol NONE DETECTED NONE DETECTED   Barbiturates NONE DETECTED NONE DETECTED    Comment: (NOTE) DRUG SCREEN FOR MEDICAL PURPOSES ONLY.  IF CONFIRMATION IS NEEDED FOR ANY PURPOSE, NOTIFY LAB WITHIN 5 DAYS.  LOWEST DETECTABLE LIMITS FOR URINE DRUG SCREEN Drug Class                     Cutoff (ng/mL) Amphetamine and metabolites    1000 Barbiturate and metabolites    200 Benzodiazepine                 761 Tricyclics and metabolites     300 Opiates and metabolites        300 Cocaine and metabolites        300 THC                            50 Performed at [facility], [place] [address], [place], [place] [address]    DG CHEST PORT 1 VIEW  Result Date: 06/13/2021 CLINICAL DATA:  Code stroke EXAM: PORTABLE CHEST 1 VIEW COMPARISON:  06/13/2021 FINDINGS: Persistent left pleural effusion with adjacent atelectasis. Stable cardiomediastinal contours. Post aortic valve replacement. IMPRESSION: Persistent left pleural effusion with adjacent atelectasis. Electronically Signed   By: Macy Mis M.D.   On: 06/13/2021 10:42   MR BRAIN WO CONTRAST  Result Date: 06/13/2021 CLINICAL DATA:   Atrial fibrillation.  Acute neurologic deficit. EXAM: MRI HEAD WITHOUT CONTRAST TECHNIQUE: Multiplanar, multiecho pulse sequences of the brain and surrounding structures were obtained without intravenous contrast. COMPARISON:  None Available. FINDINGS: Brain: Small areas of acute or early subacute ischemia within both corona radiata. No acute or chronic hemorrhage. There is multifocal hyperintense T2-weighted signal within the white matter. Generalized cerebral volume loss. The midline structures are normal. Vascular: Major flow voids are preserved. Skull and upper cervical spine: Normal calvarium  and skull base. Visualized upper cervical spine and soft tissues are normal. Sinuses/Orbits:No paranasal sinus fluid levels or advanced mucosal thickening. No mastoid or middle ear effusion. Normal orbits. IMPRESSION: Small areas of acute or early subacute ischemia within both corona radiata. No hemorrhage or mass effect. Electronically Signed   By: Ulyses Jarred M.D.   On: 06/13/2021 02:59   DG Ribs Unilateral W/Chest Right  Result Date: 06/13/2021 CLINICAL DATA:  Right-sided chest pain, initial encounter EXAM: RIGHT RIBS AND CHEST - 3+ VIEW COMPARISON:  05/16/2021 FINDINGS: Cardiac shadow is mildly enlarged. Postsurgical changes are again seen. Left-sided pleural effusion is again noted and stable. Lungs are otherwise clear. No acute rib fracture is noted. No pneumothorax is seen. IMPRESSION: Persistent left-sided pleural effusion. No rib abnormality noted. Electronically Signed   By: Inez Catalina M.D.   On: 06/13/2021 01:03   DG Foot Complete Left  Result Date: 06/13/2021 CLINICAL DATA:  Left foot cellulitis EXAM: LEFT FOOT - COMPLETE 3+ VIEW COMPARISON:  None Available. FINDINGS: Soft tissue swelling is noted over the left foot particularly along the dorsum of the foot. Small calcaneal spur is noted. No acute fracture or dislocation is seen. IMPRESSION: Soft tissue swelling without acute bony abnormality.  Electronically Signed   By: Inez Catalina M.D.   On: 06/13/2021 01:02   CT ANGIO HEAD NECK W WO CM W PERF (CODE STROKE)  Result Date: 06/12/2021 CLINICAL DATA:  Acute neurologic deficit EXAM: CT ANGIOGRAPHY HEAD AND NECK CT PERFUSION BRAIN TECHNIQUE: Multidetector CT imaging of the head and neck was performed using the standard protocol during bolus administration of intravenous contrast. Multiplanar CT image reconstructions and MIPs were obtained to evaluate the vascular anatomy. Carotid stenosis measurements (when applicable) are obtained utilizing NASCET criteria, using the distal internal carotid diameter as the denominator. Multiphase CT imaging of the brain was performed following IV bolus contrast injection. Subsequent parametric perfusion maps were calculated using RAPID software. RADIATION DOSE REDUCTION: This exam was performed according to the departmental dose-optimization program which includes automated exposure control, adjustment of the mA and/or kV according to patient size and/or use of iterative reconstruction technique. CONTRAST:  167m OMNIPAQUE IOHEXOL 350 MG/ML SOLN COMPARISON:  None Available. FINDINGS: CTA NECK FINDINGS SKELETON: There is no bony spinal canal stenosis. No lytic or blastic lesion. OTHER NECK: Normal pharynx, larynx and major salivary glands. No cervical lymphadenopathy. Unremarkable thyroid gland. UPPER CHEST: Small left pleural effusion AORTIC ARCH: There is calcific atherosclerosis of the aortic arch. There is no aneurysm, dissection or hemodynamically significant stenosis of the visualized portion of the aorta. Conventional 3 vessel aortic branching pattern. The visualized proximal subclavian arteries are widely patent. RIGHT CAROTID SYSTEM: No dissection, occlusion or aneurysm. Mild atherosclerotic calcification at the carotid bifurcation without hemodynamically significant stenosis. LEFT CAROTID SYSTEM: Multifocal atherosclerotic calcification without hemodynamically  significant stenosis. VERTEBRAL ARTERIES: Left dominant configuration. Both origins are clearly patent. There is no dissection, occlusion or flow-limiting stenosis to the skull base (V1-V3 segments). CTA HEAD FINDINGS POSTERIOR CIRCULATION: --Vertebral arteries: The right vertebral artery terminates in PICA. There is mild atherosclerosis of the left without stenosis. --Inferior cerebellar arteries: Normal. --Basilar artery: Normal. --Superior cerebellar arteries: Normal. --Posterior cerebral arteries (PCA): Moderate narrowing of the distal left P2 segment. ANTERIOR CIRCULATION: --Intracranial internal carotid arteries: Atherosclerotic calcification of the internal carotid arteries at the skull base without hemodynamically significant stenosis. --Anterior cerebral arteries (ACA): Normal. Both A1 segments are present. Patent anterior communicating artery (a-comm). --Middle cerebral arteries (MCA): Normal. VENOUS SINUSES:  As permitted by contrast timing, patent. ANATOMIC VARIANTS: None Review of the MIP images confirms the above findings. CT Brain Perfusion Findings: Perfusion images are degraded by motion but there is no visible perfusion deficit. IMPRESSION: 1. No emergent large vessel occlusion. 2. Moderate narrowing of the distal left PCA P2 segment. 3. Motion degraded CTP without visible perfusion deficit. 4. Small left pleural effusion. 5. Aortic Atherosclerosis (ICD10-I70.0). Electronically Signed   By: Ulyses Jarred M.D.   On: 06/12/2021 23:37   CT HEAD CODE STROKE WO CONTRAST  Result Date: 06/12/2021 CLINICAL DATA:  Code stroke.  Acute neurologic deficit. EXAM: CT HEAD WITHOUT CONTRAST TECHNIQUE: Contiguous axial images were obtained from the base of the skull through the vertex without intravenous contrast. RADIATION DOSE REDUCTION: This exam was performed according to the departmental dose-optimization program which includes automated exposure control, adjustment of the mA and/or kV according to patient  size and/or use of iterative reconstruction technique. COMPARISON:  None Available. FINDINGS: Brain: There is no mass, hemorrhage or extra-axial collection. There is generalized atrophy without lobar predilection. There is hypoattenuation of the periventricular white matter, most commonly indicating chronic ischemic microangiopathy. Old right basal ganglia small vessel infarct. Vascular: No abnormal hyperdensity of the major intracranial arteries or dural venous sinuses. No intracranial atherosclerosis. Skull: The visualized skull base, calvarium and extracranial soft tissues are normal. Sinuses/Orbits: No fluid levels or advanced mucosal thickening of the visualized paranasal sinuses. No mastoid or middle ear effusion. The orbits are normal. ASPECTS [facility] Stroke Program Early CT Score) - Ganglionic level infarction (caudate, lentiform nuclei, internal capsule, insula, M1-M3 cortex): 7 - Supraganglionic infarction (M4-M6 cortex): 3 Total score (0-10 with 10 being normal): 10 IMPRESSION: 1. No acute intracranial abnormality. 2. ASPECTS is 10. These results were called by telephone at the time of interpretation on 06/12/2021 at 11:06 pm to provider Dr. Curly Shores, who verbally acknowledged these results. Electronically Signed   By: Ulyses Jarred M.D.   On: 06/12/2021 23:07    Pending Labs Unresulted Labs (From admission, onward)     Start     Ordered   06/15/21 0500  APTT  Daily,   R      06/13/21 1255   06/15/21 0500  Heparin level (unfractionated)  Daily,   R      06/13/21 1255   06/14/21 0500  CBC  Tomorrow morning,   R        06/13/21 1351   06/14/21 6433  Basic metabolic panel  Tomorrow morning,   R        06/13/21 1351   06/13/21 2100  Heparin level (unfractionated)  Once-Timed,   TIMED        06/13/21 1255   06/13/21 2100  APTT  Once-Timed,   TIMED        06/13/21 1255   06/13/21 1025  TSH  Add-on,   AD        06/13/21 1025   06/13/21 0649  Pathologist smear review  Once,   R        06/13/21  0648   06/13/21 0648  Technologist smear review  Once,   R        06/13/21 0648            Vitals/Pain Today's Vitals   06/13/21 1000 06/13/21 1226 06/13/21 1227 06/13/21 1500  BP: (!) 162/73 (!) 174/82  (!) 167/96  Pulse: (!) 145 (!) 115  (!) 119  Resp: '16 15  13  '$ Temp:  TempSrc:      SpO2: 97% 98%  97%  Weight:      Height:      PainSc:   Asleep     Isolation Precautions No active isolations  Medications Medications  sodium chloride flush (NS) 0.9 % injection 3 mL (3 mLs Intravenous Not Given 06/13/21 0538)  hydrALAZINE (APRESOLINE) injection 10 mg (10 mg Intravenous Given 06/13/21 0747)  amiodarone (NEXTERONE) 1.8 mg/mL load via infusion 150 mg (150 mg Intravenous Bolus from Bag 06/13/21 1023)    Followed by  amiodarone (NEXTERONE PREMIX) 360-4.14 MG/200ML-% (1.8 mg/mL) IV infusion (60 mg/hr Intravenous New Bag/Given 06/13/21 1100)    Followed by  amiodarone (NEXTERONE PREMIX) 360-4.14 MG/200ML-% (1.8 mg/mL) IV infusion (30 mg/hr Intravenous New Bag/Given 06/13/21 1023)  heparin ADULT infusion 100 units/mL (25000 units/289m) (500 Units/hr Intravenous New Bag/Given 06/13/21 1351)  iohexol (OMNIPAQUE) 350 MG/ML injection 100 mL (100 mLs Intravenous Contrast Given 06/12/21 2311)  LORazepam (ATIVAN) injection 1 mg (1 mg Intravenous Given 06/13/21 0055)  lidocaine-EPINEPHrine (XYLOCAINE W/EPI) 2 %-1:200000 (PF) injection 10 mL (10 mLs Intradermal Given 06/13/21 0415)  hydrALAZINE (APRESOLINE) injection 10 mg (10 mg Intravenous Given 06/13/21 0413)    Mobility non-ambulatory High fall risk   Focused Assessments Neuro Assessment Handoff:  Swallow screen pass? No    NIH Stroke Scale ( + Modified Stroke Scale Criteria)  Interval: Initial Level of Consciousness (1a.)   : Alert, keenly responsive LOC Questions (1b. )   +: Answers both questions correctly LOC Commands (1c. )   + : Performs both tasks correctly Best Gaze (2. )  +: Normal Visual (3. )  +: No visual loss Facial Palsy  (4. )    : Normal symmetrical movements Motor Arm, Left (5a. )   +: Drift Motor Arm, Right (5b. )   +: Drift Motor Leg, Left (6a. )   +: No drift Motor Leg, Right (6b. )   +: No drift Limb Ataxia (7. ): Present in two limbs Sensory (8. )   +: Normal, no sensory loss Best Language (9. )   +: Mild-to-moderate aphasia Dysarthria (10. ): Mild-to-moderate dysarthria, patient slurs at least some words and, at worst, can be understood with some difficulty Extinction/Inattention (11.)   +: No Abnormality Modified SS Total  +: 3 Complete NIHSS TOTAL: 8 Last date known well: 06/12/21 Last time known well: 1700 Neuro Assessment:   Neuro Checks:   Initial (06/12/21 2245)  Last Documented NIHSS Modified Score: 3 (06/13/21 1300) Has TPA been given? No If patient is a Neuro Trauma and patient is going to OR before floor call report to [facility]: [phone] [phone]  R Recommendations: See Admitting Provider Note  Report given to:   Additional Notes: Pt came in as code stroke, MRI showed small ischemic stroke. Able to follow commands but significant weakness on right side in arm and leg. Alert to self, usually A&Ox4 and independent.

## 2021-06-13 NOTE — ED Notes (Signed)
Again, pt is unable to be woken long enough to have NIH competed.  Provider aware.

## 2021-06-13 NOTE — ED Notes (Signed)
Spoke with primary admitting MD-- updated on pt BP and status

## 2021-06-13 NOTE — ED Notes (Signed)
Pure wick placed, brief changed

## 2021-06-13 NOTE — Progress Notes (Signed)
ANTICOAGULATION CONSULT NOTE - Initial Consult  Pharmacy Consult for Heparin Indication: atrial fibrillation, left atrial thrombus seen on TEE   No Known Allergies  Patient Measurements: Height: 5' (152.4 cm) Weight: 41.4 kg (91 lb 4.3 oz) IBW/kg (Calculated) : 45.5 Heparin Dosing Weight:  41 kg  Vital Signs: BP: 174/82 (06/08 1226) Pulse Rate: 115 (06/08 1226)  Labs: Recent Labs    06/12/21 2254 06/12/21 2255  HGB 14.3 13.9  HCT 42.0 41.5  PLT  --  115*  APTT  --  26  LABPROT  --  15.4*  INR  --  1.2  CREATININE 1.00 1.22*    Estimated Creatinine Clearance: 24 mL/min (A) (by C-G formula based on SCr of 1.22 mg/dL (H)).   Medical History: Past Medical History:  Diagnosis Date   Aortic stenosis    a. Echocardiogram (01/11/14):  Mild LVH, EF 60-65%, Gr 1 DD, possible bicuspid AV, severe AS (mean 61 mmHg, peak 104 mmHg), mild MVP of post leaflet, mild MR, PASP 33 mmHg.;  b. 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 LHC (01/13/14):  dLM 70 extending into oLAD, mLAD 40-50, pD1 80, pD2 70, ostial/prox OM1 70 >> CABG (L-LAD, S-OM1, S-D1, S-D2)  // Myoview 10/22: Fixed defect anterolateral wall consistent with artifact, EF 71, no ischemia, low risk   Carotid artery occlusion    a. s/p L CEA 2013;  b.  Carotid US (1/16):  Bilateral ICA 1-39% // Carotid US 10/22: Bilateral ICA 1-39   CLL (chronic lymphocytic leukemia) (HCC)    slow leukemia---Dr  Murinson   H/O exercise stress test    NSSTT   Hx of cardiovascular stress test    a. Nuclear stress test That (4/13): Normal perfusion, EF 74%   Hx of echocardiogram    Echo (2/16):  Mild LVH, EF 60-65%, no RWMA, Gr 2 DD, AVR ok (mean 9 mmHg), trivial AI, mild MR, mild LAE, PASP 40 mmHg   Hypertension    PAD (peripheral artery disease) ([place])    LE Arterial US 10/22: R - pCFA, oSFA, dSFA 30-49; L - pCFA + mSFA 50-74, pPop 30-49  >> referred to [facility]   Pancreatitis    h/o   Thrombocytopenia ([place]) 11/19/2010    Medications:  Await electronic med rec  Assessments: 81 y.o. F presents with aphasia. Pt on apixaban PTA for h/o afib, L atrial appendage thrombus on TEE in March. Last dose 6/7 2000. Pt now with acute CVA. Pt having trouble swallowing so Eliquis on hold. Neurology would like to start low-dose heparin gtt until oral AC can be restarted. Baseline aPTT 26 sec. H/H wnl. Plt 115 (120-140s over past month) Apixaban likely affecting heparin level so will utilize aPTT for monitoring until levels correlate.  Goal of Therapy:  aPTT 66-85 seconds; heparin level 0.3-0.5 units/ml Monitor platelets by anticoagulation protocol: Yes   Plan:  Start heparin infusion at 500 units/hr Will f/u 8 hr aPTT and heparin level Daily heparin level and aPTT  Sherlon Handing, PharmD, BCPS Please see amion for complete clinical pharmacist phone list 06/13/2021,12:33 PM

## 2021-06-14 ENCOUNTER — Other Ambulatory Visit (HOSPITAL_COMMUNITY): Payer: Self-pay

## 2021-06-14 DIAGNOSIS — I63413 Cerebral infarction due to embolism of bilateral middle cerebral arteries: Secondary | ICD-10-CM | POA: Diagnosis not present

## 2021-06-14 LAB — CBC
HCT: 44.6 % (ref 36.0–46.0)
Hemoglobin: 14.7 g/dL (ref 12.0–15.0)
MCH: 30.9 pg (ref 26.0–34.0)
MCHC: 33 g/dL (ref 30.0–36.0)
MCV: 93.7 fL (ref 80.0–100.0)
Platelets: 119 10*3/uL — ABNORMAL LOW (ref 150–400)
RBC: 4.76 MIL/uL (ref 3.87–5.11)
RDW: 13.3 % (ref 11.5–15.5)
WBC: 5.7 10*3/uL (ref 4.0–10.5)
nRBC: 0 % (ref 0.0–0.2)

## 2021-06-14 LAB — BASIC METABOLIC PANEL
Anion gap: 9 (ref 5–15)
BUN: 17 mg/dL (ref 8–23)
CO2: 25 mmol/L (ref 22–32)
Calcium: 9.9 mg/dL (ref 8.9–10.3)
Chloride: 103 mmol/L (ref 98–111)
Creatinine, Ser: 1.16 mg/dL — ABNORMAL HIGH (ref 0.44–1.00)
GFR, Estimated: 48 mL/min — ABNORMAL LOW (ref >60)
Glucose, Bld: 102 mg/dL — ABNORMAL HIGH (ref 70–99)
Potassium: 3.2 mmol/L — ABNORMAL LOW (ref 3.5–5.1)
Sodium: 137 mmol/L (ref 135–145)

## 2021-06-14 LAB — APTT
aPTT: 26 seconds (ref 24–36)
aPTT: 25 seconds (ref 24–36)

## 2021-06-14 LAB — HEPARIN LEVEL (UNFRACTIONATED): Heparin Unfractionated: >1.1 IU/mL — ABNORMAL HIGH (ref 0.30–0.70)

## 2021-06-14 MED ORDER — POTASSIUM CHLORIDE 10 MEQ/100ML IV SOLN
10.0000 meq | INTRAVENOUS | Status: AC
  Administered 2021-06-14 (×4): 10 meq via INTRAVENOUS
  Filled 2021-06-14 (×4): qty 100

## 2021-06-14 MED ORDER — METOPROLOL TARTRATE 50 MG PO TABS
100.0000 mg | ORAL_TABLET | Freq: Two times a day (BID) | ORAL | Status: DC
  Administered 2021-06-15 – 2021-06-16 (×3): 100 mg via ORAL
  Filled 2021-06-14 (×3): qty 2

## 2021-06-14 MED ORDER — METOPROLOL TARTRATE 50 MG PO TABS
100.0000 mg | ORAL_TABLET | Freq: Two times a day (BID) | ORAL | Status: DC
  Administered 2021-06-14: 100 mg via ORAL
  Filled 2021-06-14: qty 2

## 2021-06-14 NOTE — Progress Notes (Signed)
Spoke with Dr Farrel Gordon because pt is now in sinus rhythm and hr is in the 70s-80s and to see if she wanted me to stop amiodarone drip.  She said to not stop the drip but to titrate it down, as it would be stopped tomorrow.  I did ask for specific orders to titrate it because it was going at the ordered dose of 30 mg/hr, as that was the dose that was ordered to be titrated down to.  She said she would discuss with her team and get back with me.  I did pass  that info to the oncoming nurse, as I was leaving my shift.  Pt is resting at this time and denies complaints.  No signs of distress.

## 2021-06-14 NOTE — Progress Notes (Signed)
[place] for Heparin Indication: atrial fibrillation, left atrial thrombus seen on TEE   No Known Allergies  Patient Measurements: Height: 5' (152.4 cm) Weight: 41.4 kg (91 lb 4.3 oz) IBW/kg (Calculated) : 45.5 Heparin Dosing Weight:  41 kg  Vital Signs: Temp: 98.2 F (36.8 C) (06/09 1555) Temp Source: Oral (06/09 1555) BP: 149/91 (06/09 1555) Pulse Rate: 102 (06/09 1555)  Labs: Recent Labs    06/12/21 2254 06/12/21 2254 06/12/21 2255 06/13/21 2050 06/14/21 0549 06/14/21 1446  HGB 14.3  --  13.9  --  14.7  --   HCT 42.0  --  41.5  --  44.6  --   PLT  --   --  115*  --  119*  --   APTT  --    < > '26 24 26 25  '$ LABPROT  --   --  15.4*  --   --   --   INR  --   --  1.2  --   --   --   HEPARINUNFRC  --   --   --  >1.10* >1.10*  --   CREATININE 1.00  --  1.22*  --  1.16*  --    < > = values in this interval not displayed.     Estimated Creatinine Clearance: 25.3 mL/min (A) (by C-G formula based on SCr of 1.16 mg/dL (H)).   Medical History: Past Medical History:  Diagnosis Date   Aortic stenosis    a. Echocardiogram (01/11/14):  Mild LVH, EF 60-65%, Gr 1 DD, possible bicuspid AV, severe AS (mean 61 mmHg, peak 104 mmHg), mild MVP of post leaflet, mild MR, PASP 33 mmHg.;  b. 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 LHC (01/13/14):  dLM 70 extending into oLAD, mLAD 40-50, pD1 80, pD2 70, ostial/prox OM1 70 >> CABG (L-LAD, S-OM1, S-D1, S-D2)  // Myoview 10/22: Fixed defect anterolateral wall consistent with artifact, EF 71, no ischemia, low risk   Carotid artery occlusion    a. s/p L CEA 2013;  b.  Carotid US (1/16):  Bilateral ICA 1-39% // Carotid US 10/22: Bilateral ICA 1-39   CLL (chronic lymphocytic leukemia) (HCC)    slow leukemia---Dr  Murinson   H/O exercise stress test    NSSTT   Hx of cardiovascular stress test    a.  Nuclear stress test That (4/13): Normal perfusion, EF 74%   Hx of echocardiogram    Echo (2/16):  Mild LVH, EF 60-65%, no RWMA, Gr 2 DD, AVR ok (mean 9 mmHg), trivial AI, mild MR, mild LAE, PASP 40 mmHg   Hypertension    PAD (peripheral artery disease) ([place])    LE Arterial US 10/22: R - pCFA, oSFA, dSFA 30-49; L - pCFA + mSFA 50-74, pPop 30-49 >> referred to [facility]   Pancreatitis    h/o   Thrombocytopenia ([place]) 11/19/2010    Medications:  Await electronic med rec  Assessments: 81 y.o. F presents with aphasia. Pt on apixaban PTA for h/o afib, L atrial appendage thrombus on TEE in March. Last dose 6/7 2000. Pt now with acute CVA. Pt having trouble swallowing so Eliquis on hold. Neurology would like to start low-dose heparin gtt until oral AC can be restarted. Baseline aPTT 26 sec. H/H wnl. Plt 115 (120-140s over past month) Apixaban  likely affecting heparin level so will utilize aPTT for monitoring until levels correlate.  aPTT subtherapeutic 25, no issues with infusion or overt bleeding documented. CBC stable. Heparin levels do not appear to be correlating at this time. Will dose off of APTT for now.    Goal of Therapy:  aPTT 66-85 seconds; heparin level 0.3-0.5 units/ml Monitor platelets by anticoagulation protocol: Yes   Plan:  Increase heparin infusion to 800 units/hr Will f/u aPTT in 8 hours and heparin level in AM Daily heparin level and aPTT  Samamtha Tiegs A. Levada Dy, PharmD, BCPS, FNKF Clinical Pharmacist [place] Please utilize Amion for appropriate phone number to reach the unit pharmacist ([place])

## 2021-06-14 NOTE — Progress Notes (Signed)
[place] for Heparin Indication: atrial fibrillation, left atrial thrombus seen on TEE   No Known Allergies  Patient Measurements: Height: 5' (152.4 cm) Weight: 41.4 kg (91 lb 4.3 oz) IBW/kg (Calculated) : 45.5 Heparin Dosing Weight:  41 kg  Vital Signs: Temp: 98 F (36.7 C) (06/09 0335) Temp Source: Oral (06/09 0335) BP: 176/105 (06/09 0335) Pulse Rate: 123 (06/09 0335)  Labs: Recent Labs    06/12/21 2254 06/12/21 2255 06/13/21 2050 06/14/21 0549  HGB 14.3 13.9  --  14.7  HCT 42.0 41.5  --  44.6  PLT  --  115*  --  119*  APTT  --  '26 24 26  '$ LABPROT  --  15.4*  --   --   INR  --  1.2  --   --   HEPARINUNFRC  --   --  >1.10* >1.10*  CREATININE 1.00 1.22*  --  1.16*     Estimated Creatinine Clearance: 25.3 mL/min (A) (by C-G formula based on SCr of 1.16 mg/dL (H)).   Medical History: Past Medical History:  Diagnosis Date   Aortic stenosis    a. Echocardiogram (01/11/14):  Mild LVH, EF 60-65%, Gr 1 DD, possible bicuspid AV, severe AS (mean 61 mmHg, peak 104 mmHg), mild MVP of post leaflet, mild MR, PASP 33 mmHg.;  b. 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 LHC (01/13/14):  dLM 70 extending into oLAD, mLAD 40-50, pD1 80, pD2 70, ostial/prox OM1 70 >> CABG (L-LAD, S-OM1, S-D1, S-D2)  // Myoview 10/22: Fixed defect anterolateral wall consistent with artifact, EF 71, no ischemia, low risk   Carotid artery occlusion    a. s/p L CEA 2013;  b.  Carotid US (1/16):  Bilateral ICA 1-39% // Carotid US 10/22: Bilateral ICA 1-39   CLL (chronic lymphocytic leukemia) (HCC)    slow leukemia---Dr  Murinson   H/O exercise stress test    NSSTT   Hx of cardiovascular stress test    a. Nuclear stress test That (4/13): Normal perfusion, EF 74%   Hx of echocardiogram    Echo (2/16):  Mild LVH, EF 60-65%, no RWMA, Gr 2 DD, AVR ok (mean 9  mmHg), trivial AI, mild MR, mild LAE, PASP 40 mmHg   Hypertension    PAD (peripheral artery disease) ([place])    LE Arterial US 10/22: R - pCFA, oSFA, dSFA 30-49; L - pCFA + mSFA 50-74, pPop 30-49 >> referred to [facility]   Pancreatitis    h/o   Thrombocytopenia ([place]) 11/19/2010    Medications:  Await electronic med rec  Assessments: 81 y.o. F presents with aphasia. Pt on apixaban PTA for h/o afib, L atrial appendage thrombus on TEE in March. Last dose 6/7 2000. Pt now with acute CVA. Pt having trouble swallowing so Eliquis on hold. Neurology would like to start low-dose heparin gtt until oral AC can be restarted. Baseline aPTT 26 sec. H/H wnl. Plt 115 (120-140s over past month) Apixaban likely affecting heparin level so will utilize aPTT for monitoring until levels correlate.  aPTT subtherapeutic 26, no issues with infusion or overt bleeding documented. CBC stable   Goal of Therapy:  aPTT 66-85 seconds; heparin level 0.3-0.5 units/ml Monitor platelets by anticoagulation protocol: Yes   Plan:  Increase heparin infusion to 700 units/hr Will f/u aPTT and heparin level  in AM Daily heparin level and aPTT  Georga Bora, PharmD Clinical Pharmacist 06/14/2021 7:05 AM Please check AMION for all [place] numbers

## 2021-06-14 NOTE — Progress Notes (Signed)
STROKE TEAM PROGRESS NOTE   INTERVAL HISTORY Patient is resting comfortably in bed.  Her mental status appears to be improved and she is more interactive in the conversation today but continues to have difficulty with recall, attention and multitasking. Vitals:   06/13/21 2352 06/14/21 0335 06/14/21 0747 06/14/21 1100  BP: (!) 160/122 (!) 176/105 137/87 (!) 142/74  Pulse: (!) 118 (!) 123 (!) 126 75  Resp: '18 17 18 16  '$ Temp: 97.8 F (36.6 C) 98 F (36.7 C) 98.7 F (37.1 C) 98 F (36.7 C)  TempSrc: Oral Oral Oral Oral  SpO2: 100% 99% 99% 100%  Weight:      Height:       CBC:  Recent Labs  Lab 06/12/21 2255 06/14/21 0549  WBC 7.6 5.7  NEUTROABS 4.0  --   HGB 13.9 14.7  HCT 41.5 44.6  MCV 95.2 93.7  PLT 115* 130*   Basic Metabolic Panel:  Recent Labs  Lab 06/12/21 2255 06/14/21 0549  NA 137 137  K 4.0 3.2*  CL 100 103  CO2 26 25  GLUCOSE 125* 102*  BUN 18 17  CREATININE 1.22* 1.16*  CALCIUM 9.9 9.9   Lipid Panel: No results for input(s): "CHOL", "TRIG", "HDL", "CHOLHDL", "VLDL", "LDLCALC" in the last 168 hours. HgbA1c: No results for input(s): "HGBA1C" in the last 168 hours. Urine Drug Screen:  Recent Labs  Lab 06/13/21 0541  LABOPIA NONE DETECTED  COCAINSCRNUR NONE DETECTED  LABBENZ NONE DETECTED  AMPHETMU NONE DETECTED  THCU NONE DETECTED  LABBARB NONE DETECTED    Alcohol Level No results for input(s): "ETH" in the last 168 hours.  IMAGING past 24 hours No results found.  PHYSICAL EXAM Physical Exam  Constitutional: Appears well-developed and well-nourished.  Elderly Caucasian lady Cardiovascular: Irregularly irregular Respiratory: Effort normal, non-labored breathing   Neuro: Mental Status: Patient is awake, alert.  Disoriented to time place and person.  Diminished attention, registration and recall. Unable to give any significant history.   Speech seems clear today. Minimally follow commands some difficulty with repeating or naming objects on  request  Cranial Nerves: II: Visual Fields are full to orienting to stimuli in all quadrants. Pupils are equal, round, and reactive to light.   III,IV, VI: EOMI, tracking examiner V: Facial sensation is symmetric VII: Facial movement is symmetric.  VIII: hearing is intact to voice X: Uvula elevates symmetrically XI: Shoulder shrug is symmetric. XII: tongue is midline without atrophy or fasciculations.  Motor: Tone is normal. Bulk is normal.  Confrontational strength testing limited by her confusion. Appears to have some right wrist drift, but moves BUE antigravity.  Will not hold BLE to commands but strength appears to be grossly equal. Sensory: Equally reactive to light noxious stimulus in all 4 extremities Plantar reflexes: Upgoing bilaterally Cerebellar: FNF and HKS are intact bilaterally  ASSESSMENT/PLAN [name] is a 81 y.o. 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 CTA head & neck Moderate narrowing of the distal left PCA P2 segment. MRI  small infarcts in the bilateral corona radiata 2D Echo EF 55-60% TEE 04/04/2021- A left atrial/left atrial appendage  thrombus was detected LDL 63 HgbA1c 5.0 VTE prophylaxis - SCDs    Diet   DIET DYS 3 Room service appropriate? Yes; Fluid consistency: Thin   Eliquis (apixaban) daily prior to admission, now on No antithrombotic. Resume eliquis when medically able or switch to Pradaxa if she can afford the co-pay. 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  This could be cardioembolic from A-fib detected on anticoagulation but the patient and subcortical nature suggestive of small vessel disease as well.  I do not think there is data suggesting changing Eliquis to Pradaxa or Xarelto or adding aspirin is definitely of any benefit.  Change IV heparin to Pradaxa if patient can afford the co-pay. .  We will need to go back to assisted living or skilled nursing facility for further rehabilitation needs.  Discussed with Dr.Guilloud and internal medicine teaching team.  Stroke team will sign off.  Can call for questions greater than 50% time during this 35-minute visit was spent on counseling and coordination of care about her strokes and dysphagia and answering questions.  Antony Contras, MD Medical Director [facility] Pager: [phone] 06/14/2021 2:35 PM   To contact Stroke Continuity provider, please refer to [email or web address] After hours, contact General Neurology

## 2021-06-14 NOTE — TOC Benefit Eligibility Note (Signed)
Patient Advocate Encounter   Received notification that prior authorization for Dabigatran Etexilate Mesylate '75MG'$  capsules is required.   PA submitted on 06/14/2021 Key [id number]  Status is pending       Lyndel Safe, [place] Patient Advocate Specialist [place] Patient Advocate Team Direct Number: [phone]  Fax: [phone]

## 2021-06-14 NOTE — Progress Notes (Signed)
Speech Language Pathology Treatment: Dysphagia  Patient Details Name: Nicole Mercado MRN: [id number] DOB: [birth year] Today's Date: 06/14/2021 Time: 4098-1191 SLP Time Calculation (min) (ACUTE ONLY): 21 min  Assessment / Plan / Recommendation Clinical Impression  Pt is more alert today and more consistently following commands. Her swallowing today appears to be generally consistent with findings from recent admission, including mildly prolonged oral phase and occasional cough after thin liquids. This cough appears to be eliminated with cues for a chin tuck, which was effective on MBS at reducing pharyngeal residuals. Given that her swallow presents like her documented baseline, would recommend resuming recommendations from MBS a few weeks ago: Dys 3 diet, thin liquids, whole meds in puree, and assistance during meals for use of chin tuck. SLP will continue to follow acutely - unclear if her cognitive-linguistic function has returned to baseline or not. MD may still want to consider ordering SLP cognitive-linguistic evaluation.   HPI HPI: Pt is an 81 yo female presenting from [facility] with aphasia. MRI showed small areas of acute or early subacute ischemia within both corona radiata. Previous clinical swallow eval last 05/17/21 revealed intermittent coughing with thin liquids. MBS 05/20/21 revealed mild oral deficits and moderate vallecular residue that was effective at clearing residue. Trace, sensed aspiration occurred x1 before the swallow but did not happen again despite challenging. Dys 3 solids and thin liquids with chin tuck recommended, although there was concern given cognitive status that she might need help wtih training to use strategy. PMH includes: a fib, aortic stenosis s/p PVR, CAD s/p CABG, CAD s/p CEA, PAD, HLD, CLL, and chronic thrombocytopenia and a recent admission for a TIA      SLP Plan  Continue with current plan of care      Recommendations for follow up therapy are one  component of a multi-disciplinary discharge planning process, led by the attending physician.  Recommendations may be updated based on patient status, additional functional criteria and insurance authorization.    Recommendations  Diet recommendations: Dysphagia 3 (mechanical soft);Thin liquid Liquids provided via: Cup;Straw Medication Administration: Whole meds with puree Supervision: Staff to assist with self feeding;Full supervision/cueing for compensatory strategies Compensations: Chin tuck Postural Changes and/or Swallow Maneuvers: Seated upright 90 degrees                Oral Care Recommendations: Oral care BID Follow Up Recommendations: Skilled nursing-short term rehab (<3 hours/day) Assistance recommended at discharge: Frequent or constant Supervision/Assistance SLP Visit Diagnosis: Dysphagia, unspecified (R13.10) Plan: Continue with current plan of care           Osie Bond., M.A. [place] Office [phone]  Secure chat preferred   06/14/2021, 9:44 AM

## 2021-06-14 NOTE — TOC Benefit Eligibility Note (Addendum)
Patient Teacher, English as a foreign language completed.    The patient is currently admitted and upon discharge could be taking dabigatran (Pradaxa) 75 mg capsules.  Non Formulary.  Eliquis and Xarelto is preferred.  The patient is insured through [employer], [place] Patient Advocate Specialist [place] Patient Advocate Team Direct Number: [phone]  Fax: [phone]

## 2021-06-14 NOTE — Evaluation (Signed)
Physical Therapy Evaluation Patient Details Name: Nicole Mercado MRN: [id number] DOB: 1940-12-15 Today's Date: 06/14/2021  History of Present Illness  81 yo female presenting to ED on 6/8 with aphasia and AMS. MRI showing small areas of acute or early subacute ischemia within both corona radiata. PMH including afib, aortic stenosis s/p prosthetic valve, CAD s/p CABG, carotid artery disease s/p CEA, PAD, hyperlipidemia, CLL, chronic thrombocytopenia, and recent admission for TIA.  Clinical Impression  Pt admitted with/for  s/s of stroke.  Pt needing min to mod assist for basic mobility.  Pt currently limited functionally due to the problems listed. ( See problems list.)   Pt will benefit from PT to maximize function and safety in order to get ready for next venue listed below.        Recommendations for follow up therapy are one component of a multi-disciplinary discharge planning process, led by the attending physician.  Recommendations may be updated based on patient status, additional functional criteria and insurance authorization.  Follow Up Recommendations Skilled nursing-short term rehab (<3 hours/day)    Assistance Recommended at Discharge Frequent or constant Supervision/Assistance  Patient can return home with the following  A little help with walking and/or transfers;A little help with bathing/dressing/bathroom;Assistance with cooking/housework;Assist for transportation;Direct supervision/assist for medications management;Direct supervision/assist for financial management    Equipment Recommendations None recommended by PT  Recommendations for Other Services       Functional Status Assessment Patient has had a recent decline in their functional status and demonstrates the ability to make significant improvements in function in a reasonable and predictable amount of time.     Precautions / Restrictions Precautions Precautions: Fall (Cardiac Monitoring) Precaution Comments: R  and/or posterior leans Restrictions Weight Bearing Restrictions: No      Mobility  Bed Mobility Overal bed mobility: Needs Assistance Bed Mobility: Supine to Sit     Supine to sit: Min assist     General bed mobility comments: Min A to elevate trunk    Transfers Overall transfer level: Needs assistance Equipment used: 1 person hand held assist Transfers: Sit to/from Stand, Bed to chair/wheelchair/BSC Sit to Stand: Min assist (mod assist +2 safety as pt fatigued and R side degraded.)           General transfer comment: Min A to mod with fatigue to power up from sit to stand EOB and correct posterior lean. Mod A for maintaining standing balance.  In PT evaluation, pt was not appropriate to get to the chair due to degrading status.    Ambulation/Gait                  Stairs            Wheelchair Mobility    Modified Rankin (Stroke Patients Only) Modified Rankin (Stroke Patients Only) Pre-Morbid Rankin Score: Slight disability Modified Rankin: Moderately severe disability     Balance Overall balance assessment: Needs assistance Sitting-balance support: No upper extremity supported, Feet supported Sitting balance-Leahy Scale: Poor Sitting balance - Comments: Min due to posterior lean. Without assist, pt slowly listed back and to the right Postural control: Posterior lean Standing balance support: Bilateral upper extremity supported, During functional activity Standing balance-Leahy Scale: Poor Standing balance comment: reliant on AD or external support                             Pertinent Vitals/Pain Pain Assessment Pain Assessment: Faces Faces Pain Scale: No hurt  Pain Intervention(s): Monitored during session    Home Living Family/patient expects to be discharged to:: Skilled nursing facility Living Arrangements: Alone Available Help at Discharge: Other (Comment) (Family lives out of town) Type of Home: Assisted living (Was living  at [facility]) Home Access: Level entry       Home Layout: One level [place]: Conservation officer, nature (2 wheels);Shower seat Additional Comments: Information provided by granddaughter as pt not oriented    Prior Function Prior Level of Function : Needs assist  Cognitive Assist : ADLs (cognitive)           Mobility Comments: Walking without DME, pt able to get to the toilet without assist per grand dtr. ADLs Comments: Performing ADLs. [place] assisting with IADLs including meals and medications     Hand Dominance   Dominant Hand: Right    Extremity/Trunk Assessment   Upper Extremity Assessment Upper Extremity Assessment: Defer to OT evaluation RUE Deficits / Details: during PT session, pt R UE and hand function visibly degraded over course of the session , pt unable to coordinate assist with R UE to scoot and unable to bring had to or grip the RW during standing activity RUE Coordination: decreased fine motor    Lower Extremity Assessment Lower Extremity Assessment: Generalized weakness;RLE deficits/detail RLE Deficits / Details: Pt's R LE degraded noticeably over course of session to a point where her R LE was forward and wide based in stance with decreased ability to position and bear weight without assist. RLE Coordination: decreased fine motor    Cervical / Trunk Assessment Cervical / Trunk Assessment: Kyphotic  Communication   Communication: Receptive difficulties  Cognition Arousal/Alertness: Awake/alert Behavior During Therapy: WFL for tasks assessed/performed Overall Cognitive Status: Impaired/Different from baseline Area of Impairment: Orientation, Attention, Memory, Following commands, Safety/judgement, Awareness, Problem solving                 Orientation Level: Disoriented to, Place, Time, Situation Current Attention Level: Sustained Memory: Decreased short-term memory Following Commands: Follows one step commands inconsistently, Follows one  step commands with increased time Safety/Judgement: Decreased awareness of safety, Decreased awareness of deficits Awareness: Intellectual Problem Solving: Slow processing, Requires verbal cues, Decreased initiation General Comments: pt had no awareness that there were problems with mobility or cognition,  "There's nothing wrong.        General Comments General comments (skin integrity, edema, etc.): HR in the 160's and 170's, but pt was assymptomatic, though could be a good reason for the functional degradation at EOB/standing during the PT session.    Exercises     Assessment/Plan    PT Assessment Patient needs continued PT services  PT Problem List Decreased strength;Decreased activity tolerance;Decreased balance;Decreased mobility;Decreased coordination;Decreased safety awareness;Decreased knowledge of use of DME       PT Treatment Interventions DME instruction;Gait training;Functional mobility training;Therapeutic activities;Balance training;Neuromuscular re-education;Patient/family education    PT Goals (Current goals can be found in the Care Plan section)  Acute Rehab PT Goals Patient Stated Goal: none stated PT Goal Formulation: Patient unable to participate in goal setting Time For Goal Achievement: 05/31/21 Potential to Achieve Goals: Fair    Frequency Min 3X/week     Co-evaluation               AM-PAC PT "6 Clicks" Mobility  Outcome Measure Help needed turning from your back to your side while in a flat bed without using bedrails?: A Little Help needed moving from lying on your back to sitting on the  side of a flat bed without using bedrails?: A Little Help needed moving to and from a bed to a chair (including a wheelchair)?: A Lot Help needed standing up from a chair using your arms (e.g., wheelchair or bedside chair)?: A Lot Help needed to walk in hospital room?: A Lot Help needed climbing 3-5 steps with a railing? : A Lot 6 Click Score: 14    End of  Session Equipment Utilized During Treatment: Gait belt Activity Tolerance: Patient limited by fatigue Patient left: in bed;with call bell/phone within reach;with bed alarm set;with family/visitor present Nurse Communication: Mobility status PT Visit Diagnosis: Other abnormalities of gait and mobility (R26.89);Muscle weakness (generalized) (M62.81);Other symptoms and signs involving the nervous system (R29.898)    Time: 6283-6629 PT Time Calculation (min) (ACUTE ONLY): 44 min   Charges:   PT Evaluation $PT Eval Moderate Complexity: 1 Mod PT Treatments $Therapeutic Activity: 8-22 mins $Neuromuscular Re-education: 8-22 mins        06/14/2021  Ginger Carne., PT Acute Rehabilitation Services [phone]  (pager) [phone]  (office)  Tessie Fass Bartley Vuolo 06/14/2021, 6:31 PM

## 2021-06-14 NOTE — Progress Notes (Signed)
  Amiodarone Drug - Drug Interaction Consult Note  Recommendations: Amiodarone is metabolized by the cytochrome P450 system and therefore has the potential to cause many drug interactions. Amiodarone has an average plasma half-life of 50 days (range 20 to 100 days).   There is potential for drug interactions to occur several weeks or months after stopping treatment and the onset of drug interactions may be slow after initiating amiodarone.   '[]'$  Statins: Increased risk of myopathy. Simvastatin- restrict dose to '20mg'$  daily. Other statins: counsel patients to report any muscle pain or weakness immediately.  '[]'$  Anticoagulants: Amiodarone can increase anticoagulant effect. Consider warfarin dose reduction. Patients should be monitored closely and the dose of anticoagulant altered accordingly, remembering that amiodarone levels take several weeks to stabilize.  '[]'$  Antiepileptics: Amiodarone can increase plasma concentration of phenytoin, the dose should be reduced. Note that small changes in phenytoin dose can result in large changes in levels. Monitor patient and counsel on signs of toxicity.  '[x]'$  Beta blockers: increased risk of bradycardia, AV block and myocardial depression. Sotalol - avoid concomitant use.  '[]'$   Calcium channel blockers (diltiazem and verapamil): increased risk of bradycardia, AV block and myocardial depression.  '[]'$   Cyclosporine: Amiodarone increases levels of cyclosporine. Reduced dose of cyclosporine is recommended.  '[]'$  Digoxin dose should be halved when amiodarone is started.  '[]'$  Diuretics: increased risk of cardiotoxicity if hypokalemia occurs.  '[]'$  Oral hypoglycemic agents (glyburide, glipizide, glimepiride): increased risk of hypoglycemia. Patient's glucose levels should be monitored closely when initiating amiodarone therapy.   '[]'$  Drugs that prolong the QT interval:  Torsades de pointes risk may be increased with concurrent use - avoid if possible.  Monitor QTc, also  keep magnesium/potassium WNL if concurrent therapy can't be avoided.  Antibiotics: e.g. fluoroquinolones, erythromycin.  Antiarrhythmics: e.g. quinidine, procainamide, disopyramide, sotalol.  Antipsychotics: e.g. phenothiazines, haloperidol.   Lithium, tricyclic antidepressants, and methadone.   Thank you for allowing pharmacy to be a part of this patient's care.  Ardyth Harps, PharmD Clinical Pharmacist

## 2021-06-14 NOTE — Consult Note (Signed)
WOC Nurse Consult Note: Patient receiving care in [facility] 3W10. Consult completed remotely after review of record and images of left foot hematoma. Reason for Consult: left foot wound Wound type: According to note entry from Dr. Newman Nip on 06/13/21 at 4:42 p.m., it is a hematoma Pressure Injury POA: Yes/No/NA Measurement: Wound bed: Drainage (amount, consistency, odor)  Periwound: Dressing procedure/placement/frequency: Cover the hematoma on the dorsum of the left foot with Xeroform Kellie Simmering [phone]). Secure with kerlix. Change daily.  Monitor the wound area(s) for worsening of condition such as: Signs/symptoms of infection,  Increase in size,  Development of or worsening of odor, Development of pain, or increased pain at the affected locations.  Notify the medical team if any of these develop.  [facility] nurse will not follow at this time.  Please re-consult the [facility] team if needed.  Val Riles, RN, MSN, CWOCN, CNS-BC, pager [phone]

## 2021-06-14 NOTE — Plan of Care (Signed)
  Problem: Coping: Goal: Will verbalize positive feelings about self Outcome: Progressing   Problem: Self-Care: Goal: Ability to participate in self-care as condition permits will improve Outcome: Progressing   Problem: Nutrition: Goal: Dietary intake will improve Outcome: Progressing   Problem: Ischemic Stroke/TIA Tissue Perfusion: Goal: Complications of ischemic stroke/TIA will be minimized Outcome: Progressing

## 2021-06-14 NOTE — Progress Notes (Addendum)
HD#1 Subjective:  Overnight Events: NAEON. BP 160-170s/100-120s. HR 100-120s. Afebrile, on room air.   Patient seen and assessed at bedside. Patient mentation much improved relative to yesterday. Able to speak full sentences and was alert to self and location but not context.   Objective:  Vital signs in last 24 hours: Vitals:   06/13/21 1733 06/13/21 2102 06/13/21 2352 06/14/21 0335  BP: (!) 178/85 (!) 177/100 (!) 160/122 (!) 176/105  Pulse: (!) 125 (!) 126 (!) 118 (!) 123  Resp: '18 17 18 17  '$ Temp: 98.7 F (37.1 C) 98.2 F (36.8 C) 97.8 F (36.6 C) 98 F (36.7 C)  TempSrc: Oral Oral Oral Oral  SpO2: 99%  100% 99%  Weight:      Height:       Supplemental O2: Room Air SpO2: 99 %   Physical Exam:  Physical Exam Constitutional:      General: She is not in acute distress.    Appearance: She is not ill-appearing.     Comments: Pleasantly demented caucasian elderly female  HENT:     Head: Normocephalic and atraumatic.  Eyes:     Extraocular Movements: Extraocular movements intact.     Pupils: Pupils are equal, round, and reactive to light.  Cardiovascular:     Rate and Rhythm: Normal rate. Rhythm irregular.     Pulses: Normal pulses.     Heart sounds: Normal heart sounds.  Pulmonary:     Effort: Pulmonary effort is normal.     Breath sounds: Normal breath sounds.  Abdominal:     General: Abdomen is flat. There is no distension.     Palpations: Abdomen is soft. There is no mass.  Skin:    General: Skin is warm and dry.     Comments: Left foot hematoma on dorsal side. No surrounding erythema noted. Feet are both warm and dry to touch  Neurological:     Mental Status: She is alert.     Comments: Alert to self and location but not context Strength: 5/5 in all extremities Sensation: symmetrical and appropriate      Filed Weights   06/12/21 2328  Weight: 41.4 kg     Intake/Output Summary (Last 24 hours) at 06/14/2021 0721 Last data filed at 06/14/2021  0714 Gross per 24 hour  Intake 91.22 ml  Output 300 ml  Net -208.78 ml   Net IO Since Admission: -208.78 mL [06/14/21 0721]  Pertinent Labs:    Latest Ref Rng & Units 06/14/2021    5:49 AM 06/12/2021   10:55 PM 06/12/2021   10:54 PM  CBC  WBC 4.0 - 10.5 K/uL 5.7  7.6    Hemoglobin 12.0 - 15.0 g/dL 14.7  13.9  14.3   Hematocrit 36.0 - 46.0 % 44.6  41.5  42.0   Platelets 150 - 400 K/uL 119  115         Latest Ref Rng & Units 06/14/2021    5:49 AM 06/12/2021   10:55 PM 06/12/2021   10:54 PM  CMP  Glucose 70 - 99 mg/dL 102  125  117   BUN 8 - 23 mg/dL '17  18  22   '$ Creatinine 0.44 - 1.00 mg/dL 1.16  1.22  1.00   Sodium 135 - 145 mmol/L 137  137  135   Potassium 3.5 - 5.1 mmol/L 3.2  4.0  4.1   Chloride 98 - 111 mmol/L 103  100  100   CO2 22 - 32 mmol/L  25  26    Calcium 8.9 - 10.3 mg/dL 9.9  9.9    Total Protein 6.5 - 8.1 g/dL  5.8    Total Bilirubin 0.3 - 1.2 mg/dL  1.5    Alkaline Phos 38 - 126 U/L  72    AST 15 - 41 U/L  27    ALT 0 - 44 U/L  18      Imaging: DG CHEST PORT 1 VIEW  Result Date: 06/13/2021 CLINICAL DATA:  Code stroke EXAM: PORTABLE CHEST 1 VIEW COMPARISON:  06/13/2021 FINDINGS: Persistent left pleural effusion with adjacent atelectasis. Stable cardiomediastinal contours. Post aortic valve replacement. IMPRESSION: Persistent left pleural effusion with adjacent atelectasis. Electronically Signed   By: Macy Mis M.D.   On: 06/13/2021 10:42    Assessment/Plan:   Principal Problem:   CVA (cerebral vascular accident) [facility])   Patient Summary: Nicole Mercado is a 81 y.o. with pertinent PMH of atrial fibrillation on Eliquis, aortic stenosis s/p prosthetic valve replacement, left atrial appendage thrombus, CAD s/p CABG, carotid artery disease s/p CEA, PAD, hyperlipidemia, CLL, and chronic thrombocytopenia and a recent admission for a TIA admitted with a new acute CVA.   #Acute Bilateral Small Corona Radiata CVAs Secondary to either small vessel disease or  cardioembolic (known left atrial appendage thrombus noted on TEE during March admission) -Dysphagia 3 with whole meds per SLP recommendations -Stroke team following, appreciate recommendations -Goal BP <220/110, Hydralazine if elevated beyond goal -PT/OT eval and treat as able -Will restart statin when able to tolerate PO -Heparin drip with plan to switch to Pradaxa if prior authorization approved  Atrial Fibrillation, now in RVR Aortic stenosis s/p prosthetic valve replacement in 2016 Left atrial thrombus (noted on TEE in 03/2021) Still on Amiodarone gtt. Titrating back to home meds -anticoagulation plan as above -Resume home metoprolol 100 mg bid with plan to resume home diltiazem tomorrow -Amiodarone infusion, titrate off as able  HTN Home med: diltiazem 120 mg qd, metoprolol tartrate 200 mg bid -resuming home meds as above  Foot Hematoma Stable, not erythematous -Continue to monitor  CKD Hypokalemia Cr 1.22>1.16. Baseline GFR 50. K 3.2. -Avoid nephrotoxic agents -Daily BMP -Repleting K  Chronic thrombocytopenia Platelets 115>119 Monitor while on heparin - daily CBC  CAD s/p CABG in 2016; PAD; HLD; Aortic atherosclerosis Carotid artery disease s/p CEA in 2013 -Continue to monitor  Small Left Pleural Effusion Persistent since even as early as 2016 -Outpatient monitoring  Diet: NPO IVF: None,None VTE: Heparin Code: DNR/DNI PT/OT recs: Pending, none. TOC recs: pending   Dispo: Anticipated discharge to  ALF  in 3 days pending stroke workup and able to tolerate PO.   France Ravens, MD 06/14/2021, 7:21 AM Pager: [phone]  Please contact the on call pager after 5 pm and on weekends at [phone].

## 2021-06-14 NOTE — Progress Notes (Signed)
  Transition of Care [facility]) Screening Note   Patient Details  Name: Nicole Mercado Date of Birth: 1940/04/14   Transition of Care [facility]) CM/SW Contact:    Pollie Friar, RN Phone Number: 06/14/2021, 2:53 PM    Transition of Care Department [facility]) has reviewed patient. We will continue to monitor patient advancement through interdisciplinary progression rounds. If new patient transition needs arise, please place a TOC consult. Awaiting PT/OT evals.  Pt is from home alone.

## 2021-06-14 NOTE — Evaluation (Signed)
Occupational Therapy Evaluation Patient Details Name: Nicole Mercado MRN: [id number] DOB: [birth year] Today's Date: 06/14/2021   History of Present Illness 81 yo female presenting to ED on 6/8 with aphasia and AMS. MRI showing small areas of acute or early subacute ischemia within both corona radiata. PMH including afib, aortic stenosis s/p prosthetic valve, CAD s/p CABG, carotid artery disease s/p CEA, PAD, hyperlipidemia, CLL, chronic thrombocytopenia, and recent admission for TIA.   Clinical Impression   PTA patient lived in [facility] and was independent with ADLS.  Staff provided assistance for cooking and medication management.  Patient currently requires Min A for UB ADLs and Mod to Max A for LB ADLs, may fluctuate with endurance and cognitive level.  Pt performs sit to stand Min A this session and bed to chair transfer Mod A Step Pivot. Patient presenting with decreased cognition, ADLs, transfers, and balance.  Patient will benefit from continued Acute OT services to address and increase deficit areas.  Recommend dc to SNF for further OT to optimize safety, cognition, independence with ADLs and return to PLOF.      Recommendations for follow up therapy are one component of a multi-disciplinary discharge planning process, led by the attending physician.  Recommendations may be updated based on patient status, additional functional criteria and insurance authorization.   Follow Up Recommendations  Skilled nursing-short term rehab (<3 hours/day)    Assistance Recommended at Discharge Frequent or constant Supervision/Assistance  Patient can return home with the following A lot of help with walking and/or transfers;A lot of help with bathing/dressing/bathroom;Direct supervision/assist for medications management;Direct supervision/assist for financial management;Assistance with cooking/housework;Assist for transportation    Functional Status Assessment  Patient has had  a recent decline in their functional status and demonstrates the ability to make significant improvements in function in a reasonable and predictable amount of time.  Equipment Recommendations  Other (comment) (Defer to next venue)    Recommendations for Other Services PT consult;Speech consult     Precautions / Restrictions Precautions Precautions: Fall (Cardiac Monitoring) Precaution Comments: R and/or posterior leans Restrictions Weight Bearing Restrictions: No      Mobility Bed Mobility Overal bed mobility: Needs Assistance Bed Mobility: Supine to Sit     Supine to sit: Min assist          Transfers Overall transfer level: Needs assistance   Transfers: Sit to/from Stand, Bed to chair/wheelchair/BSC Sit to Stand: Min assist     Step pivot transfers: Mod assist     General transfer comment: Min A to power up from sit to stand EOB      Balance Overall balance assessment: Needs assistance Sitting-balance support: Bilateral upper extremity supported, Feet supported Sitting balance-Leahy Scale: Poor   Postural control: Posterior lean, Right lateral lean Standing balance support: Bilateral upper extremity supported, No upper extremity supported Standing balance-Leahy Scale: Zero                             ADL either performed or assessed with clinical judgement   ADL Overall ADL's : Needs assistance/impaired Eating/Feeding: Set up;Sitting;Supervision/ safety   Grooming: Wash/dry face;Min guard;Sitting (EOB)   Upper Body Bathing: Minimal assistance;Sitting   Lower Body Bathing: Moderate assistance;Sit to/from stand   Upper Body Dressing : Minimal assistance;Sitting   Lower Body Dressing: Maximal assistance;Sit to/from stand   Toilet Transfer: Moderate assistance;Stand-pivot Toilet Transfer Details (indicate cue type and reason): simulated to recliner Toileting- Clothing Manipulation and  Hygiene: Moderate assistance;Sit to/from stand        Functional mobility during ADLs: Moderate assistance (stand pivot) General ADL Comments: Pt presenting with decreased balance, strength, and cognition     Vision Baseline Vision/History: 1 Wears glasses Vision Assessment?: No apparent visual deficits     Perception     Praxis      Pertinent Vitals/Pain Pain Assessment Faces Pain Scale: Hurts a little bit Pain Location: back of left thigh during pressure during transfer Pain Descriptors / Indicators: Grimacing Pain Intervention(s): Monitored during session     Hand Dominance Right   Extremity/Trunk Assessment Upper Extremity Assessment Upper Extremity Assessment: Overall WFL for tasks assessed   Lower Extremity Assessment Lower Extremity Assessment: Defer to PT evaluation       Communication Communication Communication: Receptive difficulties   Cognition Arousal/Alertness: Awake/alert Behavior During Therapy: WFL for tasks assessed/performed Overall Cognitive Status: Impaired/Different from baseline Area of Impairment: Orientation, Memory, Safety/judgement, Awareness, Problem solving                 Orientation Level: Disoriented to, Person, Place, Time, Situation Current Attention Level: Focused Memory: Decreased short-term memory Following Commands: Follows one step commands with increased time Safety/Judgement: Decreased awareness of safety, Decreased awareness of deficits Awareness: Intellectual Problem Solving: Slow processing, Decreased initiation, Difficulty sequencing, Requires verbal cues, Requires tactile cues       General Comments  Granddaughter present throughout. HR 90-110s.    Exercises     Shoulder Instructions      Home Living Family/patient expects to be discharged to:: Skilled nursing facility Living Arrangements: Alone Available Help at Discharge: Other (Comment) (Family lives out of town) Type of Home: Assisted living (Was living at [facility]) Home Access: Level entry      [place]: One level     Bathroom Shower/Tub: Occupational psychologist: Handicapped height Bathroom Accessibility: Yes How Accessible: Accessible via wheelchair;Accessible via [place]: Conservation officer, nature (2 wheels);Shower seat   Additional Comments: Information provided by granddaughter as pt not oriented  Lives With:  (residents of assisted living community.)    Prior Functioning/Environment Prior Level of Function : Needs assist  Cognitive Assist : ADLs (cognitive)           Mobility Comments: Walking without DME ADLs Comments: Performing ADLs. [place] assisting with IADLs including meals and medications        OT Problem List: Decreased strength;Decreased activity tolerance;Impaired balance (sitting and/or standing);Decreased cognition;Decreased safety awareness;Decreased knowledge of precautions      OT Treatment/Interventions: Self-care/ADL training;Therapeutic exercise;Neuromuscular education;Therapeutic activities;Cognitive remediation/compensation;Patient/family education    OT Goals(Current goals can be found in the care plan section) Acute Rehab OT Goals Patient Stated Goal: Get stronger OT Goal Formulation: With patient/family Time For Goal Achievement: 06/28/21 Potential to Achieve Goals: Good  OT Frequency: Min 2X/week    Co-evaluation              AM-PAC OT "6 Clicks" Daily Activity     Outcome Measure Help from another person eating meals?: A Little Help from another person taking care of personal grooming?: A Little Help from another person toileting, which includes using toliet, bedpan, or urinal?: A Lot Help from another person bathing (including washing, rinsing, drying)?: A Lot Help from another person to put on and taking off regular upper body clothing?: A Little Help from another person to put on and taking off regular lower body clothing?: A Lot 6 Click Score: 15   End of Session  Equipment Utilized During  Treatment: Teacher, adult education Communication: Mobility status;Other (comment) (HR)  Activity Tolerance: Patient tolerated treatment well Patient left: in chair;with call bell/phone within reach;with chair alarm set;with family/visitor present  OT Visit Diagnosis: Unsteadiness on feet (R26.81);Other abnormalities of gait and mobility (R26.89);Muscle weakness (generalized) (M62.81);Cognitive communication deficit (R41.841);Other symptoms and signs involving cognitive function Symptoms and signs involving cognitive functions: Cerebral infarction                Time: 6314-9702 OT Time Calculation (min): 50 min Charges:  OT General Charges $OT Visit: 1 Visit OT Evaluation $OT Eval Moderate Complexity: 1 Mod OT Treatments $Self Care/Home Management : 23-37 mins  Sofiya Ezelle Yeary-Pickett,OTS 06/14/2021, 5:51 PM

## 2021-06-14 NOTE — Hospital Course (Addendum)
AM Interview 06/20/21: No numbness, tingling, pain anywhere. Ready go home.  Nicole Mercado is a 81 y.o. with pertinent PMH of atrial fibrillation on Eliquis, aortic stenosis s/p prosthetic valve replacement, left atrial appendage thrombus, CAD s/p CABG, carotid artery disease s/p CEA, PAD, hyperlipidemia, CLL, and chronic thrombocytopenia and a recent admission for a TIA admitted with a new acute CVA. Hospital course is detailed below:  Subacute Bilateral Small Corona Radiata CVAs Admitted after MRI showed bilateral small CVA acute in the corona radiata.  Neurology was consulted and suggested it was likely secondary to small vessel disease or cardioembolic given bilaterality of CVA.  Patient has been on Eliquis for A-fib as well as a left atrial appendage noted on TEE during March 2023 admission.  Given recent stroke work-up performed in May, no further stroke work-up was done.  Due to patient's aphasia, patient was initially bridged with IV heparin until she was able to tolerate p.o.  Given patient's large stroke despite being on Eliquis, decision was made to transition her to Pradaxa with close monitoring of her kidney function.  PT/OT worked with patient and recommended SNF placement.  Patient's aphasia appears to improve throughout hospitalization and anticipate some improvement while at SNF.  Orthostatic Hypotension Hypertension Patient had significant orthostatic hypotension from sitting 132/66 to 80/41 on standing. Patient also had an episode of aphasia when she was started on hydralazine in order manage her hypertension. Patient's home anti-hypertensives were held in the setting of this and IVF was given in order to support patient's blood pressure. In addition, patient has had poor PO intake without encouragement so may require regular encouragement to eat food. Patient will be recommended to hold home metoprolol until seen by PCP and orthostatic hypotension is better in order to avoid further  hypoperfusion events to her brain.   Atrial Fibrillation Patient's atrial fibrillation began to go into RVR during this admission.  Patient was started on IV amiodarone because of allowing for permissive hypertension given patient's stroke.  This was slowly tapered off and patient was resumed on her home metoprolol and diltiazem. Given her orthostasic hypotension, patient's metoprolol was held but diltiazem was continued. Heart rates have been rate controlled and normal sinus rhythmupon discharge.   Left foot hematoma Patient was noted to have a left foot hematoma on admission.  This has been stable throughout hospitalization it does not appear to be actively bleeding or infected.  Wound dressing was placed and dressed daily.   Other chronic conditions were medically managed with home medications and formulary alternatives as necessary (hypertension, CKD, chronic thrombocytopenia, CLL, CAD, persistent left pleural effusion, aortic stenosis)

## 2021-06-15 DIAGNOSIS — I639 Cerebral infarction, unspecified: Secondary | ICD-10-CM

## 2021-06-15 LAB — APTT
aPTT: 149 seconds — ABNORMAL HIGH (ref 24–36)
aPTT: 184 seconds (ref 24–36)

## 2021-06-15 LAB — CBC
HCT: 37.8 % (ref 36.0–46.0)
Hemoglobin: 12.5 g/dL (ref 12.0–15.0)
MCH: 31.1 pg (ref 26.0–34.0)
MCHC: 33.1 g/dL (ref 30.0–36.0)
MCV: 94 fL (ref 80.0–100.0)
Platelets: 114 10*3/uL — ABNORMAL LOW (ref 150–400)
RBC: 4.02 MIL/uL (ref 3.87–5.11)
RDW: 13.2 % (ref 11.5–15.5)
WBC: 6.5 10*3/uL (ref 4.0–10.5)
nRBC: 0 % (ref 0.0–0.2)

## 2021-06-15 LAB — BASIC METABOLIC PANEL
Anion gap: 9 (ref 5–15)
BUN: 27 mg/dL — ABNORMAL HIGH (ref 8–23)
CO2: 23 mmol/L (ref 22–32)
Calcium: 9.6 mg/dL (ref 8.9–10.3)
Chloride: 105 mmol/L (ref 98–111)
Creatinine, Ser: 1.45 mg/dL — ABNORMAL HIGH (ref 0.44–1.00)
GFR, Estimated: 36 mL/min — ABNORMAL LOW (ref >60)
Glucose, Bld: 100 mg/dL — ABNORMAL HIGH (ref 70–99)
Potassium: 3.6 mmol/L (ref 3.5–5.1)
Sodium: 137 mmol/L (ref 135–145)

## 2021-06-15 LAB — HEPARIN LEVEL (UNFRACTIONATED): Heparin Unfractionated: >1.1 IU/mL — ABNORMAL HIGH (ref 0.30–0.70)

## 2021-06-15 MED ORDER — QUETIAPINE FUMARATE 25 MG PO TABS
25.0000 mg | ORAL_TABLET | Freq: Once | ORAL | Status: AC | PRN
  Administered 2021-06-15: 25 mg via ORAL
  Filled 2021-06-15: qty 1

## 2021-06-15 MED ORDER — LACTATED RINGERS IV SOLN: INTRAVENOUS | Status: AC

## 2021-06-15 MED ORDER — DABIGATRAN ETEXILATE MESYLATE 75 MG PO CAPS
75.0000 mg | ORAL_CAPSULE | Freq: Two times a day (BID) | ORAL | Status: DC
  Administered 2021-06-15 – 2021-06-20 (×11): 75 mg via ORAL
  Filled 2021-06-15 (×12): qty 1

## 2021-06-15 MED ORDER — HEPARIN (PORCINE) 25000 UT/250ML-% IV SOLN
700.0000 [IU]/h | INTRAVENOUS | Status: DC
  Filled 2021-06-15: qty 250

## 2021-06-15 MED ORDER — DILTIAZEM HCL ER COATED BEADS 180 MG PO CP24
180.0000 mg | ORAL_CAPSULE | Freq: Every day | ORAL | Status: DC
  Administered 2021-06-15 – 2021-06-20 (×6): 180 mg via ORAL
  Filled 2021-06-15 (×6): qty 1

## 2021-06-15 NOTE — Progress Notes (Signed)
Physical Therapy Treatment Patient Details Name: Nicole Mercado MRN: [id number] DOB: 10/12/1940 Today's Date: 06/15/2021   History of Present Illness 81 yo female presenting to ED on 6/8 with aphasia and AMS. MRI showing small areas of acute or early subacute ischemia within both corona radiata. PMH including afib, aortic stenosis s/p prosthetic valve, CAD s/p CABG, carotid artery disease s/p CEA, PAD, hyperlipidemia, CLL, chronic thrombocytopenia, and recent admission for TIA.    PT Comments     Pt performed better today, warranting OOB to chair and progressing gait.  Pt generally has little awareness of her problems, but she did acknowledge stepping on her feet during gait and leaning to the right.  Emphasis today on transitions to EOB, scooting, sitting balance, sit to stands, stressing safety and stability, progression of gait with significant 2 person assist in the room.   Recommendations for follow up therapy are one component of a multi-disciplinary discharge planning process, led by the attending physician.  Recommendations may be updated based on patient status, additional functional criteria and insurance authorization.  Follow Up Recommendations  Skilled nursing-short term rehab (<3 hours/day)     Assistance Recommended at Discharge Frequent or constant Supervision/Assistance  Patient can return home with the following A little help with walking and/or transfers;A little help with bathing/dressing/bathroom;Assistance with cooking/housework;Assist for transportation;Direct supervision/assist for medications management;Direct supervision/assist for financial management   Equipment Recommendations  None recommended by PT    Recommendations for Other Services       Precautions / Restrictions Precautions Precautions: Fall Precaution Comments: R and/or posterior leans     Mobility  Bed Mobility Overal bed mobility: Needs Assistance Bed Mobility: Supine to Sit     Supine to  sit: Min assist     General bed mobility comments: scooting EOB with cues for hand placement/assist and truncal support to help pt build forward momentum.    Transfers Overall transfer level: Needs assistance Equipment used: 1 person hand held assist Transfers: Sit to/from Stand Sit to Stand: Min assist           General transfer comment: cues for hand placement and positioning EOB, assist forward and boost.  Immediate stability assist once up.    Ambulation/Gait Ambulation/Gait assistance: Mod assist, +2 physical assistance Gait Distance (Feet): 25 Feet Assistive device: 2 person hand held assist, IV Pole Gait Pattern/deviations: Step-through pattern, Decreased stride length Gait velocity: reduced Gait velocity interpretation: <1.31 ft/sec, indicative of household ambulator   General Gait Details: pt with a consistently narrowed and adducted gait on the L, stepping on her geet, moderately heavy lean left that worsened with time up.  R UE  was poorly attended to and was less able to assist with balance and stability at end of the gait trial than at the beginning.   Stairs             Wheelchair Mobility    Modified Rankin (Stroke Patients Only) Modified Rankin (Stroke Patients Only) Modified Rankin: Moderately severe disability     Balance Overall balance assessment: Needs assistance Sitting-balance support: Bilateral upper extremity supported, No upper extremity supported, Feet supported Sitting balance-Leahy Scale: Poor Sitting balance - Comments: improved, but still will list R inevitably with UE assist or external support. Postural control: Right lateral lean, Posterior lean Standing balance support: Bilateral upper extremity supported, During functional activity Standing balance-Leahy Scale: Poor Standing balance comment: reliant on AD or external support  Cognition Arousal/Alertness: Awake/alert Behavior During  Therapy: WFL for tasks assessed/performed Overall Cognitive Status: Impaired/Different from baseline Area of Impairment: Orientation, Attention, Memory, Following commands, Safety/judgement, Awareness, Problem solving                 Orientation Level: Disoriented to, Place, Time, Situation Current Attention Level: Sustained Memory: Decreased short-term memory Following Commands: Follows one step commands inconsistently, Follows one step commands with increased time Safety/Judgement: Decreased awareness of safety, Decreased awareness of deficits Awareness: Intellectual Problem Solving: Slow processing, Requires verbal cues, Decreased initiation          Exercises      General Comments        Pertinent Vitals/Pain Pain Assessment Pain Assessment: Faces Faces Pain Scale: No hurt Pain Intervention(s): Monitored during session    Home Living Family/patient expects to be discharged to:: Skilled nursing facility                        Prior Function            PT Goals (current goals can now be found in the care plan section) Acute Rehab PT Goals PT Goal Formulation: Patient unable to participate in goal setting Time For Goal Achievement: 06/28/21 Potential to Achieve Goals: Fair Progress towards PT goals: Progressing toward goals    Frequency    Min 3X/week      PT Plan Current plan remains appropriate    Co-evaluation              AM-PAC PT "6 Clicks" Mobility   Outcome Measure  Help needed turning from your back to your side while in a flat bed without using bedrails?: A Little Help needed moving from lying on your back to sitting on the side of a flat bed without using bedrails?: A Little Help needed moving to and from a bed to a chair (including a wheelchair)?: A Lot Help needed standing up from a chair using your arms (e.g., wheelchair or bedside chair)?: A Little Help needed to walk in hospital room?: A Lot Help needed climbing 3-5  steps with a railing? : A Lot 6 Click Score: 15    End of Session Equipment Utilized During Treatment: Gait belt Activity Tolerance: Patient limited by fatigue;Patient tolerated treatment well Patient left: in chair;with call bell/phone within reach;with chair alarm set Nurse Communication: Mobility status PT Visit Diagnosis: Other abnormalities of gait and mobility (R26.89);Muscle weakness (generalized) (M62.81);Other symptoms and signs involving the nervous system (R29.898)     Time: 8329-1916 PT Time Calculation (min) (ACUTE ONLY): 25 min  Charges:  $Gait Training: 8-22 mins $Neuromuscular Re-education: 8-22 mins                     06/15/2021  Ginger Carne., PT Acute Rehabilitation Services [phone]  (pager) [phone]  (office)   Tessie Fass Addisson Frate 06/15/2021, 1:37 PM

## 2021-06-15 NOTE — Progress Notes (Signed)
Patient continues attempting to leave the bed. Swinging lower body over bed rail,

## 2021-06-15 NOTE — Progress Notes (Signed)
ANTICOAGULATION CONSULT NOTE   Pharmacy Consult for Heparin Indication: atrial fibrillation, left atrial thrombus seen on TEE   No Known Allergies  Patient Measurements: Height: 5' (152.4 cm) Weight: 41.4 kg (91 lb 4.3 oz) IBW/kg (Calculated) : 45.5 Heparin Dosing Weight:  41 kg  Vital Signs: Temp: 97.9 F (36.6 C) (06/10 0314) Temp Source: Oral (06/10 0314) BP: 165/105 (06/10 0314) Pulse Rate: 66 (06/10 0314)  Labs: Recent Labs    06/12/21 2255 06/13/21 2050 06/14/21 0549 06/14/21 1446 06/15/21 0014 06/15/21 0321  HGB 13.9  --  14.7  --  12.5  --   HCT 41.5  --  44.6  --  37.8  --   PLT 115*  --  119*  --  114*  --   APTT '26 24 26 25 '$ 149* 184*  LABPROT 15.4*  --   --   --   --   --   INR 1.2  --   --   --   --   --   HEPARINUNFRC  --  >1.10* >1.10*  --  >1.10*  --   CREATININE 1.22*  --  1.16*  --  1.45*  --      Estimated Creatinine Clearance: 20.2 mL/min (A) (by C-G formula based on SCr of 1.45 mg/dL (H)).   Medical History: Past Medical History:  Diagnosis Date   Aortic stenosis    a. Echocardiogram (01/11/14):  Mild LVH, EF 60-65%, Gr 1 DD, possible bicuspid AV, severe AS (mean 61 mmHg, peak 104 mmHg), mild MVP of post leaflet, mild MR, PASP 33 mmHg.;  b. 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 LHC (01/13/14):  dLM 70 extending into oLAD, mLAD 40-50, pD1 80, pD2 70, ostial/prox OM1 70 >> CABG (L-LAD, S-OM1, S-D1, S-D2)  // Myoview 10/22: Fixed defect anterolateral wall consistent with artifact, EF 71, no ischemia, low risk   Carotid artery occlusion    a. s/p L CEA 2013;  b.  Carotid US (1/16):  Bilateral ICA 1-39% // Carotid US 10/22: Bilateral ICA 1-39   CLL (chronic lymphocytic leukemia) (HCC)    slow leukemia---Dr  Murinson   H/O exercise stress test    NSSTT   Hx of cardiovascular stress test    a. Nuclear stress test That (4/13): Normal  perfusion, EF 74%   Hx of echocardiogram    Echo (2/16):  Mild LVH, EF 60-65%, no RWMA, Gr 2 DD, AVR ok (mean 9 mmHg), trivial AI, mild MR, mild LAE, PASP 40 mmHg   Hypertension    PAD (peripheral artery disease) ([place])    LE Arterial US 10/22: R - pCFA, oSFA, dSFA 30-49; L - pCFA + mSFA 50-74, pPop 30-49 >> referred to [facility]   Pancreatitis    h/o   Thrombocytopenia ([place]) 11/19/2010    Medications:  Await electronic med rec  Assessments: 81 y.o. F presents with aphasia. Pt on apixaban PTA for h/o afib, L atrial appendage thrombus on TEE in March. Last dose 6/7 2000. Pt now with acute CVA. Pt having trouble swallowing so Eliquis on hold.  Apixaban likely affecting heparin level so will utilize aPTT for monitoring until levels correlate.  aPTT above goal 184; lab collected earlier was initially felt to be collected incorrectly but the RN confirmed the repeat lab was obtained correctly. No issues with infusion or overt bleeding  reported. CBC stable  Goal of Therapy:  aPTT 66-85 seconds  Heparin level 0.3-0.5 units/ml Monitor platelets by anticoagulation protocol: Yes   Plan:  Hold heparin gtt for 1 hour Resume heparin gtt '@0600'$  at 700 units/hr Will f/u aPTT in 8 hours and heparin level in AM Daily heparin level and aPTT  Georga Bora, PharmD Clinical Pharmacist 06/15/2021 5:06 AM Please check AMION for all [facility] numbers

## 2021-06-15 NOTE — Progress Notes (Signed)
HD#2 Subjective:  Overnight Events: NAEON. VSS, afebrile on room air  Patient seen and assessed at bedside. Patient mentation stable. Son and granddaughter at bedside. Denies any acute complaints at this time. Family reports she has not been at baseline since TIA last month. Discussed that this may be her new baseline but will need to take it day by day.  Family understands and amenable to current plans.  Objective:  Vital signs in last 24 hours: Vitals:   06/14/21 1942 06/14/21 2000 06/14/21 2342 06/15/21 0314  BP:  (!) 168/99 (!) 155/85 (!) 165/105  Pulse: 63 (!) 57 66 66  Resp: '19  19 18  '$ Temp: 97.6 F (36.4 C)  98.4 F (36.9 C) 97.9 F (36.6 C)  TempSrc: Oral  Oral Oral  SpO2: 99%  100% 100%  Weight:      Height:       Supplemental O2: Room Air SpO2: 100 %   Physical Exam:  Physical Exam Constitutional:      General: She is not in acute distress.    Appearance: She is not ill-appearing.     Comments: Pleasantly caucasian elderly female.  HENT:     Head: Normocephalic and atraumatic.  Eyes:     Extraocular Movements: Extraocular movements intact.     Pupils: Pupils are equal, round, and reactive to light.  Cardiovascular:     Rate and Rhythm: Normal rate and regular rhythm.     Pulses: Normal pulses.     Heart sounds: Normal heart sounds.  Pulmonary:     Effort: Pulmonary effort is normal.     Breath sounds: Normal breath sounds.  Abdominal:     General: Abdomen is flat. There is no distension.     Palpations: Abdomen is soft. There is no mass.  Skin:    General: Skin is warm and dry.     Comments: Left foot hematoma on dorsal side. No surrounding erythema noted. Feet are both warm and dry to touch  Neurological:     Mental Status: She is alert.     Comments: Alert to self only. Knows she is in hospital but not oriented to time or context Strength: 5/5 in all extremities Sensation: symmetrical and appropriate      Filed Weights   06/12/21 2328   Weight: 41.4 kg     Intake/Output Summary (Last 24 hours) at 06/15/2021 0731 Last data filed at 06/15/2021 0617 Gross per 24 hour  Intake 600 ml  Output 1150 ml  Net -550 ml    Net IO Since Admission: -758.78 mL [06/15/21 0731]  Pertinent Labs:    Latest Ref Rng & Units 06/15/2021   12:14 AM 06/14/2021    5:49 AM 06/12/2021   10:55 PM  CBC  WBC 4.0 - 10.5 K/uL 6.5  5.7  7.6   Hemoglobin 12.0 - 15.0 g/dL 12.5  14.7  13.9   Hematocrit 36.0 - 46.0 % 37.8  44.6  41.5   Platelets 150 - 400 K/uL 114  119  115        Latest Ref Rng & Units 06/15/2021   12:14 AM 06/14/2021    5:49 AM 06/12/2021   10:55 PM  CMP  Glucose 70 - 99 mg/dL 100  102  125   BUN 8 - 23 mg/dL '27  17  18   '$ Creatinine 0.44 - 1.00 mg/dL 1.45  1.16  1.22   Sodium 135 - 145 mmol/L 137  137  137  Potassium 3.5 - 5.1 mmol/L 3.6  3.2  4.0   Chloride 98 - 111 mmol/L 105  103  100   CO2 22 - 32 mmol/L '23  25  26   '$ Calcium 8.9 - 10.3 mg/dL 9.6  9.9  9.9   Total Protein 6.5 - 8.1 g/dL   5.8   Total Bilirubin 0.3 - 1.2 mg/dL   1.5   Alkaline Phos 38 - 126 U/L   72   AST 15 - 41 U/L   27   ALT 0 - 44 U/L   18     Imaging: No results found.  Assessment/Plan:   Principal Problem:   CVA (cerebral vascular accident) [facility])   Patient Summary: Nicole Mercado is a 81 y.o. with pertinent PMH of atrial fibrillation on Eliquis, aortic stenosis s/p prosthetic valve replacement, left atrial appendage thrombus, CAD s/p CABG, carotid artery disease s/p CEA, PAD, hyperlipidemia, CLL, and chronic thrombocytopenia and a recent admission for a TIA admitted with a new acute CVA.   #Acute Bilateral Small Corona Radiata CVAs Secondary to either small vessel disease or cardioembolic (known left atrial appendage thrombus noted on TEE during March admission). Given CVA on Eliquis, will switch to pradaxa. -Dysphagia 3 with whole meds per SLP recommendations -Stroke team sign off -PT/OT (recommend SNF) -TOC consult for SNF  placement -Pradaxa per pharmacy consult -Transition off IV heparin today  Atrial Fibrillation, now in RVR Aortic stenosis s/p prosthetic valve replacement in 2016 Left atrial thrombus (noted on TEE in 03/2021) -anticoagulation plan as above -Continue home metoprolol 100 mg bid -Resume home diltiazem 120 mg qd -Discontinue amiodarone  HTN Home med: diltiazem 120 mg qd, metoprolol tartrate 200 mg bid -resuming home meds as above -Continue to monitor  Foot Hematoma Stable, not erythematous -Continue to monitor  Elevated Cr CKD Hypokalemia, resolved Cr 1.16>1.45. Baseline GFR 50. K 3.2>3.6. -Avoid nephrotoxic agents -Daily BMP -LR 100 ml/hr x5 hours to support volume status  Chronic thrombocytopenia, Stable - daily CBC  CAD s/p CABG in 2016; PAD; HLD; Aortic atherosclerosis Carotid artery disease s/p CEA in 2013 -Continue to monitor  Small Left Pleural Effusion Persistent since even as early as 2016 -Outpatient monitoring  Diet: Dysphagia 3 IVF: LR 100 ml/hr x5 hours VTE: Dabigatran Code: DNR/DNI PT/OT recs: SNF, none. TOC recs: pending   Dispo: Anticipated discharge to  SNF  in 3 days pending stroke workup and able to tolerate PO.   France Ravens, MD 06/15/2021, 7:31 AM Pager: [phone]  Please contact the on call pager after 5 pm and on weekends at [phone].

## 2021-06-15 NOTE — Progress Notes (Signed)
[place] NOTE   Pharmacy Consult to transition patient from IV heparin to Dabigatran  Indication: atrial fibrillation, left atrial thrombus seen on TEE   No Known Allergies  Patient Measurements: Height: 5' (152.4 cm) Weight: 41.4 kg (91 lb 4.3 oz) IBW/kg (Calculated) : 45.5 Heparin Dosing Weight:  41 kg  Vital Signs: Temp: 97.7 F (36.5 C) (06/10 0741) Temp Source: Oral (06/10 0741) BP: 211/67 (06/10 0741) Pulse Rate: 65 (06/10 0741)  Labs: Recent Labs    06/12/21 2255 06/13/21 2050 06/14/21 0549 06/14/21 1446 06/15/21 0014 06/15/21 0321  HGB 13.9  --  14.7  --  12.5  --   HCT 41.5  --  44.6  --  37.8  --   PLT 115*  --  119*  --  114*  --   APTT '26 24 26 25 '$ 149* 184*  LABPROT 15.4*  --   --   --   --   --   INR 1.2  --   --   --   --   --   HEPARINUNFRC  --  >1.10* >1.10*  --  >1.10*  --   CREATININE 1.22*  --  1.16*  --  1.45*  --      Estimated Creatinine Clearance: 20.2 mL/min (A) (by C-G formula based on SCr of 1.45 mg/dL (H)).   Medical History: Past Medical History:  Diagnosis Date   Aortic stenosis    a. Echocardiogram (01/11/14):  Mild LVH, EF 60-65%, Gr 1 DD, possible bicuspid AV, severe AS (mean 61 mmHg, peak 104 mmHg), mild MVP of post leaflet, mild MR, PASP 33 mmHg.;  b. 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 LHC (01/13/14):  dLM 70 extending into oLAD, mLAD 40-50, pD1 80, pD2 70, ostial/prox OM1 70 >> CABG (L-LAD, S-OM1, S-D1, S-D2)  // Myoview 10/22: Fixed defect anterolateral wall consistent with artifact, EF 71, no ischemia, low risk   Carotid artery occlusion    a. s/p L CEA 2013;  b.  Carotid US (1/16):  Bilateral ICA 1-39% // Carotid US 10/22: Bilateral ICA 1-39   CLL (chronic lymphocytic leukemia) (HCC)    slow leukemia---Dr  Murinson   H/O exercise stress test    NSSTT   Hx of cardiovascular stress test    a. Nuclear  stress test That (4/13): Normal perfusion, EF 74%   Hx of echocardiogram    Echo (2/16):  Mild LVH, EF 60-65%, no RWMA, Gr 2 DD, AVR ok (mean 9 mmHg), trivial AI, mild MR, mild LAE, PASP 40 mmHg   Hypertension    PAD (peripheral artery disease) ([place])    LE Arterial US 10/22: R - pCFA, oSFA, dSFA 30-49; L - pCFA + mSFA 50-74, pPop 30-49 >> referred to [facility]   Pancreatitis    h/o   Thrombocytopenia ([place]) 11/19/2010    Medications:  Await electronic med rec  Assessments: 81 y.o. F presents with aphasia. Pt on apixaban PTA for h/o afib, L atrial appendage thrombus on TEE in March. Last dose 6/7 2000. Pt now with acute CVA. Pt having trouble swallowing so Eliquis on hold.   Patient is now to transition to dabigatran. PA has been submitted on 06/14/21. CBC stable with Hgb at 12.5 and PLT slightly low at 114.   Goal of Therapy:  Monitor platelets by anticoagulation protocol: Yes  Plan:  DC heparin gtt  Start dabigatran 75 mg BID (based on CrCl 15-<30) Monitor renal function as dabigatran should be avoided in patients with CrCl <15 CBC weekly    Joseph Art, Pharm.D. PGY-1 Pharmacy Resident [phone] 06/15/2021 7:47 AM

## 2021-06-16 DIAGNOSIS — I639 Cerebral infarction, unspecified: Secondary | ICD-10-CM | POA: Diagnosis not present

## 2021-06-16 LAB — BASIC METABOLIC PANEL
Anion gap: 11 (ref 5–15)
BUN: 20 mg/dL (ref 8–23)
CO2: 25 mmol/L (ref 22–32)
Calcium: 9.8 mg/dL (ref 8.9–10.3)
Chloride: 103 mmol/L (ref 98–111)
Creatinine, Ser: 1.27 mg/dL — ABNORMAL HIGH (ref 0.44–1.00)
GFR, Estimated: 43 mL/min — ABNORMAL LOW (ref >60)
Glucose, Bld: 114 mg/dL — ABNORMAL HIGH (ref 70–99)
Potassium: 3.6 mmol/L (ref 3.5–5.1)
Sodium: 139 mmol/L (ref 135–145)

## 2021-06-16 LAB — AEROBIC CULTURE W GRAM STAIN (SUPERFICIAL SPECIMEN): Culture: NORMAL

## 2021-06-16 LAB — CBC
HCT: 37.4 % (ref 36.0–46.0)
Hemoglobin: 13 g/dL (ref 12.0–15.0)
MCH: 31.8 pg (ref 26.0–34.0)
MCHC: 34.8 g/dL (ref 30.0–36.0)
MCV: 91.4 fL (ref 80.0–100.0)
Platelets: 113 10*3/uL — ABNORMAL LOW (ref 150–400)
RBC: 4.09 MIL/uL (ref 3.87–5.11)
RDW: 13 % (ref 11.5–15.5)
WBC: 5.2 10*3/uL (ref 4.0–10.5)
nRBC: 0 % (ref 0.0–0.2)

## 2021-06-16 MED ORDER — AMLODIPINE BESYLATE 10 MG PO TABS: 10.0000 mg | ORAL_TABLET | Freq: Every day | ORAL | Status: DC

## 2021-06-16 MED ORDER — HYDRALAZINE HCL 25 MG PO TABS
25.0000 mg | ORAL_TABLET | Freq: Three times a day (TID) | ORAL | Status: DC
  Administered 2021-06-16 – 2021-06-17 (×3): 25 mg via ORAL
  Filled 2021-06-16 (×3): qty 1

## 2021-06-16 MED ORDER — LOSARTAN POTASSIUM 25 MG PO TABS
25.0000 mg | ORAL_TABLET | Freq: Every day | ORAL | Status: DC
Start: 2021-06-16 — End: 2021-06-16
  Administered 2021-06-16: 25 mg via ORAL
  Filled 2021-06-16: qty 1

## 2021-06-16 MED ORDER — QUETIAPINE FUMARATE 25 MG PO TABS: 25.0000 mg | ORAL_TABLET | Freq: Every day | ORAL | Status: DC

## 2021-06-16 MED ORDER — TRAZODONE HCL 50 MG PO TABS
25.0000 mg | ORAL_TABLET | Freq: Every day | ORAL | Status: DC
  Administered 2021-06-16 – 2021-06-19 (×4): 25 mg via ORAL
  Filled 2021-06-16 (×4): qty 1

## 2021-06-16 MED ORDER — CARVEDILOL 12.5 MG PO TABS
25.0000 mg | ORAL_TABLET | Freq: Two times a day (BID) | ORAL | Status: DC
  Administered 2021-06-16 – 2021-06-17 (×2): 25 mg via ORAL
  Filled 2021-06-16 (×2): qty 2

## 2021-06-16 MED ORDER — QUETIAPINE FUMARATE 25 MG PO TABS: 25.0000 mg | ORAL_TABLET | Freq: Two times a day (BID) | ORAL | Status: DC | PRN

## 2021-06-16 MED ORDER — LOSARTAN POTASSIUM 25 MG PO TABS: 25.0000 mg | ORAL_TABLET | Freq: Every day | ORAL | Status: DC

## 2021-06-16 NOTE — NC FL2 (Signed)
[place] MEDICAID FL2 LEVEL OF CARE SCREENING TOOL     IDENTIFICATION  Patient Name: Nicole Mercado Birthdate: 01-18-1940 Sex: female Admission Date (Current Location): 06/12/2021  [facility] and [place] Number:  Herbalist and Address:  [facility] [place], [place] [address] [place], [place] [address]      Provider Number: [id number]  Attending Physician Name and Address:  Axel Filler, *  Relative Name and Phone Number:       Current Level of Care: Hospital Recommended Level of Care: [place] Prior Approval Number:    Date Approved/Denied:   PASRR Number: [id number]  Discharge Plan: SNF    Current Diagnoses: Patient Active Problem List   Diagnosis Date Noted   Acute ischemic stroke ([place]) 06/13/2021   Stroke (cerebrum) ([place]) 05/20/2021   Aphasia    Stroke-like episode    Hypokalemia 04/04/2021   Hypomagnesemia 04/04/2021   Physical deconditioning 04/03/2021   Pleural effusion 04/03/2021   E-coli UTI 04/03/2021   TIA (transient ischemic attack) 03/30/2021   Elevated LFTs 03/30/2021   Hyponatremia 03/30/2021   Persistent atrial fibrillation with RVR ([place]) 03/19/2021   PAD (peripheral artery disease) ([place]) 10/19/2020   Stage 3a chronic kidney disease (CKD) ([place]) 03/25/2019   History of basal cell cancer 02/11/2019   PAC (premature atrial contraction) 01/14/2019   Goals of care, counseling/discussion 12/08/2018   Skin lesion 12/18/2016   Hypercalcemia 06/19/2016   Drug-induced neutropenia ([place]) 05/27/2016   Hematuria, gross 08/28/2015   Easy bruising 05/31/2015   Encounter for chemotherapy management 11/03/2014   Essential hypertension 10/30/2014   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   S/P AVR 01/17/2014   CAD (coronary artery disease), native coronary artery 01/16/2014   Aortic stenosis 01/11/2014   Arrhythmia 01/11/2014   Syncope and collapse 01/11/2014   Faintness    Aftercare following surgery of the  circulatory system, NEC 08/18/2013   Occlusion and stenosis of carotid artery without mention of cerebral infarction 06/03/2011   CLL (chronic lymphocytic leukemia) ([place]) 03/21/2011   Cardiac murmur 03/21/2011   Carotid artery stenosis 03/21/2011   Thrombocytopenia ([place]) 11/19/2010   Lymphadenopathy 11/11/2010    Orientation RESPIRATION BLADDER Height & Weight     Self  Normal Incontinent Weight: 91 lb 4.3 oz (41.4 kg) Height:  5' (152.4 cm)  BEHAVIORAL SYMPTOMS/MOOD NEUROLOGICAL BOWEL NUTRITION STATUS      Incontinent Diet (see DC summary)  AMBULATORY STATUS COMMUNICATION OF NEEDS Skin   Limited Assist Verbally Normal                       Personal Care Assistance Level of Assistance  Bathing, Feeding, Dressing Bathing Assistance: Limited assistance Feeding assistance: Limited assistance Dressing Assistance: Limited assistance     Functional Limitations Info             [place]  PT (By licensed PT), OT (By licensed OT)     PT Frequency: 5x/wk OT Frequency: 5x/wk            Contractures Contractures Info: Not present    Additional Factors Info  Code Status, Allergies Code Status Info: DNR Allergies Info: NKA           Current Medications (06/16/2021):  This is the current hospital active medication list Current Facility-Administered Medications  Medication Dose Route Frequency Provider Last Rate Last Admin   carvedilol (COREG) tablet 25 mg  25 mg Oral BID WC France Ravens, MD  dabigatran (PRADAXA) capsule 75 mg  75 mg Oral Q12H Pauletta Browns, RPH   75 mg at 06/16/21 6144   diltiazem (CARDIZEM CD) 24 hr capsule 180 mg  180 mg Oral Daily France Ravens, MD   180 mg at 06/16/21 3154   hydrALAZINE (APRESOLINE) injection 10 mg  10 mg Intravenous Q4H PRN Bhagat, Srishti L, MD   10 mg at 06/13/21 0747   hydrALAZINE (APRESOLINE) tablet 25 mg  25 mg Oral Willa Frater, MD       sodium chloride flush (NS) 0.9 % injection 3 mL  3 mL  Intravenous Once Maudie Flakes, MD       traZODone (DESYREL) tablet 25 mg  25 mg Oral Aliene Altes, MD         Discharge Medications: Please see discharge summary for a list of discharge medications.  Relevant Imaging Results:  Relevant Lab Results:   Additional Information SS#: [id number]  Geralynn Ochs, LCSW

## 2021-06-16 NOTE — Progress Notes (Addendum)
HD#3 Subjective:  Overnight Events: BP up to 217/62. Now at 191/80. Agitated intermittently but did well after Seroquel 25 mg.   Patient seen and assessed at bedside.  Patient reports she is doing fine and did not recall having any episodes of agitation last night.  2 sons were at bedside and had no other questions at this time.  Sons reported that she appeared less confused than yesterday.  Discussed SNF placement which family was amenable to at this time.  Objective:  Vital signs in last 24 hours: Vitals:   06/15/21 1956 06/15/21 2029 06/16/21 0002 06/16/21 0415  BP: (!) 198/61 (!) 217/62 (!) 191/61 (!) 191/80  Pulse: 69 67 (!) 52 64  Resp: '14  16 16  '$ Temp: 98.6 F (37 C)  98.6 F (37 C) 97.7 F (36.5 C)  TempSrc: Oral  Oral Oral  SpO2: 100%  99% 99%  Weight:      Height:       Supplemental O2: Room Air SpO2: 99 %   Physical Exam:  Physical Exam Constitutional:      General: She is not in acute distress.    Appearance: She is not ill-appearing.     Comments: Pleasantly caucasian elderly female.  HENT:     Head: Normocephalic and atraumatic.  Eyes:     Extraocular Movements: Extraocular movements intact.     Pupils: Pupils are equal, round, and reactive to light.  Cardiovascular:     Rate and Rhythm: Normal rate and regular rhythm.     Pulses: Normal pulses.     Heart sounds: Normal heart sounds.  Pulmonary:     Effort: Pulmonary effort is normal.     Breath sounds: Normal breath sounds.  Abdominal:     General: Abdomen is flat. There is no distension.     Palpations: Abdomen is soft. There is no mass.  Skin:    General: Skin is warm and dry.     Comments: Left foot hematoma on dorsal side. No surrounding erythema noted. Feet are both warm and dry to touch  Neurological:     Mental Status: She is alert.     Comments: Alert to self and location but not time or context.  Strength: 5/5 in all extremities Sensation: symmetrical and appropriate      Filed  Weights   06/12/21 2328  Weight: 41.4 kg     Intake/Output Summary (Last 24 hours) at 06/16/2021 0625 Last data filed at 06/16/2021 0427 Gross per 24 hour  Intake 1495.22 ml  Output 1500 ml  Net -4.78 ml    Net IO Since Admission: -763.56 mL [06/16/21 0625]  Pertinent Labs:    Latest Ref Rng & Units 06/16/2021   12:28 AM 06/15/2021   12:14 AM 06/14/2021    5:49 AM  CBC  WBC 4.0 - 10.5 K/uL 5.2  6.5  5.7   Hemoglobin 12.0 - 15.0 g/dL 13.0  12.5  14.7   Hematocrit 36.0 - 46.0 % 37.4  37.8  44.6   Platelets 150 - 400 K/uL 113  114  119        Latest Ref Rng & Units 06/16/2021   12:28 AM 06/15/2021   12:14 AM 06/14/2021    5:49 AM  CMP  Glucose 70 - 99 mg/dL 114  100  102   BUN 8 - 23 mg/dL '20  27  17   '$ Creatinine 0.44 - 1.00 mg/dL 1.27  1.45  1.16   Sodium 135 -  145 mmol/L 139  137  137   Potassium 3.5 - 5.1 mmol/L 3.6  3.6  3.2   Chloride 98 - 111 mmol/L 103  105  103   CO2 22 - 32 mmol/L '25  23  25   '$ Calcium 8.9 - 10.3 mg/dL 9.8  9.6  9.9     Imaging: No results found.  Assessment/Plan:   Principal Problem:   Acute ischemic stroke [facility])   Patient Summary: Nicole Mercado is a 81 y.o. with pertinent PMH of atrial fibrillation on Eliquis, aortic stenosis s/p prosthetic valve replacement, left atrial appendage thrombus, CAD s/p CABG, carotid artery disease s/p CEA, PAD, hyperlipidemia, CLL, and chronic thrombocytopenia and a recent admission for a TIA admitted with a new acute CVA.   #Acute Bilateral Small Corona Radiata Cerebral Infarctions Secondary to either small vessel disease or cardioembolic (known left atrial appendage thrombus noted on TEE during March admission). Given CVA on Eliquis, will switch to pradaxa. -Dysphagia 3 with whole meds per SLP recommendations -PT/OT (recommend SNF) -TOC consult for SNF placement -Pradaxa 75 mg bid  Hospital Delirium -Delirium Precautions -Trazodone 25 mg qhs -1:1 sitter requested but no staffing available  Atrial  Fibrillation, now in RVR Aortic stenosis s/p prosthetic valve replacement in 2016 Left atrial thrombus (noted on TEE in 03/2021) -Pradaxa for secondary stroke prevention -Switch home metoprolol 100 mg bid to coreg 25 bid for better bp control -Continue home diltiazem 120 mg qd  HTN Home med: diltiazem 120 mg qd, metoprolol tartrate 100 mg bid -resuming home meds as above -Continue to monitor -Start hydralazine 25 mg tid -Consider losartan 25 mg qd if bp still not controlled tomorrow  Foot Hematoma Stable, not erythematous -Continue to monitor  Elevated Cr CKD Hypokalemia, resolved Cr 1.45>1.27. Baseline GFR 50. -Avoid nephrotoxic agents -Daily BMP  Chronic thrombocytopenia, Stable - CBC qod  CAD s/p CABG in 2016; PAD; HLD; Aortic atherosclerosis Carotid artery disease s/p CEA in 2013 -Continue to monitor  Small Left Pleural Effusion Persistent since even as early as 2016 -Outpatient monitoring  Diet: Dysphagia 3 IVF: none VTE: Dabigatran Code: DNR/DNI PT/OT recs: SNF, none. TOC recs: pending   Dispo: Anticipated discharge to  SNF  in 3 days pending stroke workup and able to tolerate PO.   France Ravens, MD 06/16/2021, 6:25 AM Pager: [phone]  Please contact the on call pager after 5 pm and on weekends at [phone].

## 2021-06-16 NOTE — TOC Initial Note (Signed)
Transition of Care [facility]) - Initial/Assessment Note    Patient Details  Name: Nicole Mercado MRN: [id number] Date of Birth: 04-03-1940  Transition [facility]) CM/SW Contact:    Geralynn Ochs, LCSW Phone Number: 06/16/2021, 12:26 PM  Clinical Narrative:       CSW spoke with son, [name], about recommendation for SNF. CSW answered questions and discussed concerns about patient's care needs moving forward. Patient recently at [facility] and family would prefer return there. CSW sent referral, will follow up with [facility] on bed availability.            Expected Discharge Plan: Skilled Nursing Facility Barriers to Discharge: Continued Medical Work up, Ship broker, Requiring sitter/restraints   Patient Goals and CMS Choice Patient states their goals for this hospitalization and ongoing recovery are:: patient unable to participate in goal setting, not oriented CMS Medicare.gov Compare Post Acute Care list provided to:: Patient Represenative (must comment) Choice offered to / list presented to : Adult Children  Expected Discharge Plan and Services Expected Discharge Plan: [place] Choice: [place] arrangements for the past 2 months: [place]                                      Prior Living Arrangements/Services Living arrangements for the past 2 months: [place] Lives with:: Facility Resident Patient language and need for interpreter reviewed:: No Do you feel safe going back to the place where you live?: Yes      Need for Family Participation in Patient Care: Yes (Comment) Care giver support system in place?: Yes (comment)   Criminal Activity/Legal Involvement Pertinent to Current Situation/Hospitalization: No - Comment as needed  Activities of Daily Living      Permission Sought/Granted Permission sought to share information with : Facility Automotive engineer, Family Supports Permission granted to share information with : Yes, Verbal Permission Granted  Share Information with NAME: [name]  Permission granted to share info w AGENCY: SNF  Permission granted to share info w Relationship: Son     Emotional Assessment   Attitude/Demeanor/Rapport: Unable to Assess Affect (typically observed): Unable to Assess Orientation: : Oriented to Self Alcohol / Substance Use: Not Applicable Psych Involvement: No (comment)  Admission diagnosis:  CVA (cerebral vascular accident) ([place]) [I63.9] Acute ischemic stroke ([place]) [I63.9] Cellulitis of left foot [L03.116] Patient Active Problem List   Diagnosis Date Noted   Acute ischemic stroke ([place]) 06/13/2021   Stroke (cerebrum) ([place]) 05/20/2021   Aphasia    Stroke-like episode    Hypokalemia 04/04/2021   Hypomagnesemia 04/04/2021   Physical deconditioning 04/03/2021   Pleural effusion 04/03/2021   E-coli UTI 04/03/2021   TIA (transient ischemic attack) 03/30/2021   Elevated LFTs 03/30/2021   Hyponatremia 03/30/2021   Persistent atrial fibrillation with RVR ([place]) 03/19/2021   PAD (peripheral artery disease) ([place]) 10/19/2020   Stage 3a chronic kidney disease (CKD) ([place]) 03/25/2019   History of basal cell cancer 02/11/2019   PAC (premature atrial contraction) 01/14/2019   Goals of care, counseling/discussion 12/08/2018   Skin lesion 12/18/2016   Hypercalcemia 06/19/2016   Drug-induced neutropenia ([place]) 05/27/2016   Hematuria, gross 08/28/2015   Easy bruising 05/31/2015   Encounter for chemotherapy management 11/03/2014   Essential hypertension 10/30/2014   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   S/P AVR 01/17/2014  CAD (coronary artery disease), native coronary artery 01/16/2014   Aortic stenosis 01/11/2014   Arrhythmia 01/11/2014   Syncope and collapse 01/11/2014   Faintness    Aftercare following surgery of the circulatory system, NEC 08/18/2013   Occlusion and stenosis of  carotid artery without mention of cerebral infarction 06/03/2011   CLL (chronic lymphocytic leukemia) ([place]) 03/21/2011   Cardiac murmur 03/21/2011   Carotid artery stenosis 03/21/2011   Thrombocytopenia ([place]) 11/19/2010   Lymphadenopathy 11/11/2010   PCP:  Kristie Cowman, MD Pharmacy:   [place], [place] [address] Phone: [phone] Fax: [phone]     Social Determinants of Health (SDOH) Interventions    Readmission Risk Interventions     No data to display

## 2021-06-17 ENCOUNTER — Inpatient Hospital Stay (HOSPITAL_COMMUNITY): Payer: Medicare Other

## 2021-06-17 ENCOUNTER — Other Ambulatory Visit (HOSPITAL_COMMUNITY): Payer: Self-pay

## 2021-06-17 DIAGNOSIS — I639 Cerebral infarction, unspecified: Secondary | ICD-10-CM | POA: Diagnosis not present

## 2021-06-17 LAB — BASIC METABOLIC PANEL
Anion gap: 11 (ref 5–15)
BUN: 21 mg/dL (ref 8–23)
CO2: 23 mmol/L (ref 22–32)
Calcium: 9.9 mg/dL (ref 8.9–10.3)
Chloride: 103 mmol/L (ref 98–111)
Creatinine, Ser: 1.32 mg/dL — ABNORMAL HIGH (ref 0.44–1.00)
GFR, Estimated: 41 mL/min — ABNORMAL LOW (ref >60)
Glucose, Bld: 93 mg/dL (ref 70–99)
Potassium: 4.8 mmol/L (ref 3.5–5.1)
Sodium: 137 mmol/L (ref 135–145)

## 2021-06-17 LAB — GLUCOSE, CAPILLARY: Glucose-Capillary: 186 mg/dL — ABNORMAL HIGH (ref 70–99)

## 2021-06-17 IMAGING — CT CT HEAD W/O CM
4 series · 16 of 47 positions shown, 18 images · non-contrast
Comparison: Brain MRI [DATE], CT [DATE]

CLINICAL DATA: Neuro deficit, acute, stroke suspected



[Series 3: head wo · axial · 0.40mm/px · z∈[-174,-54]mm · 7 of 32 slices shown, 9 images]
[im 4/32  brain]
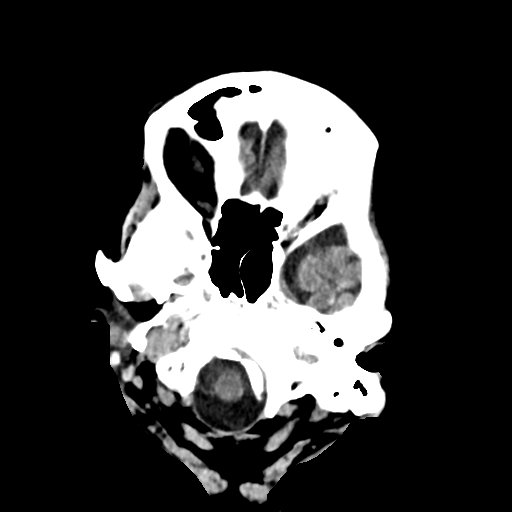
[im 4/32  bone]
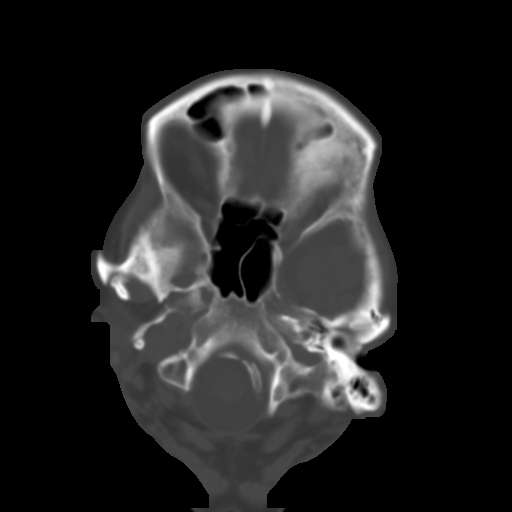
[im 8/32  brain]
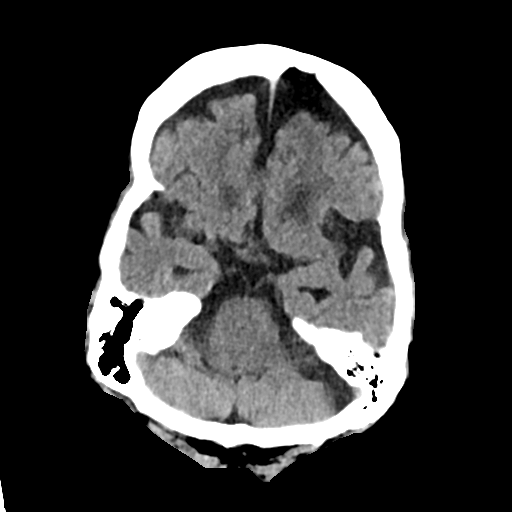
[im 12/32  brain]
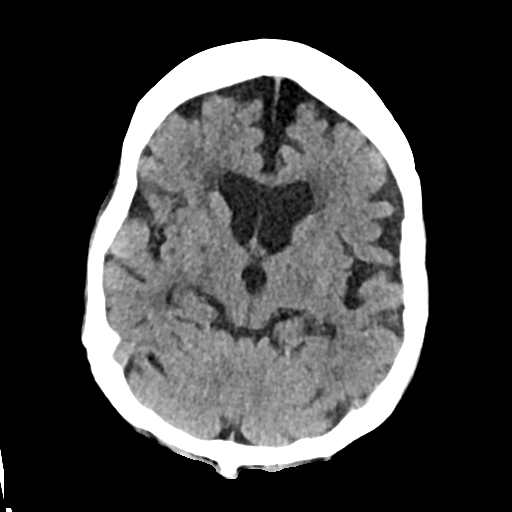
[im 16/32  brain]
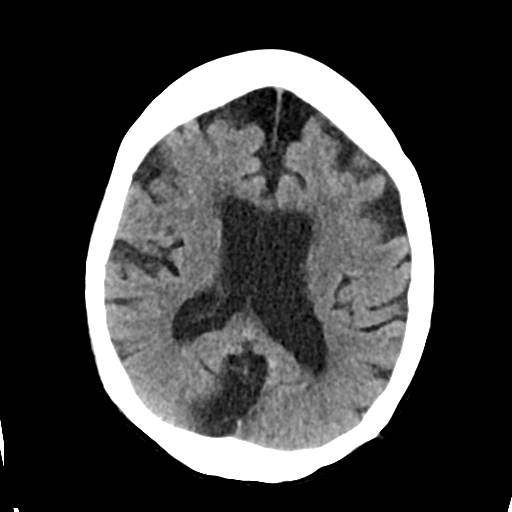
[im 20/32  brain]
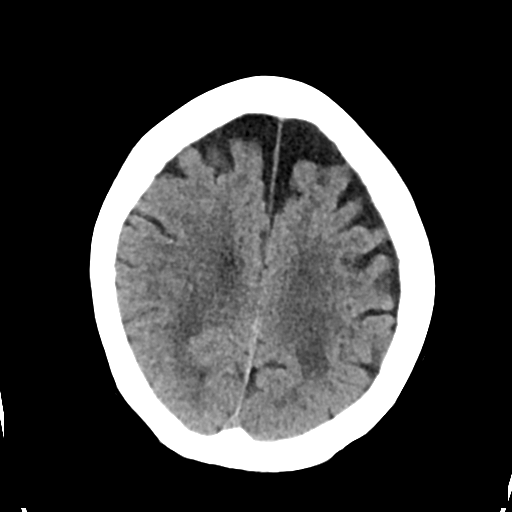
[im 20/32  bone]
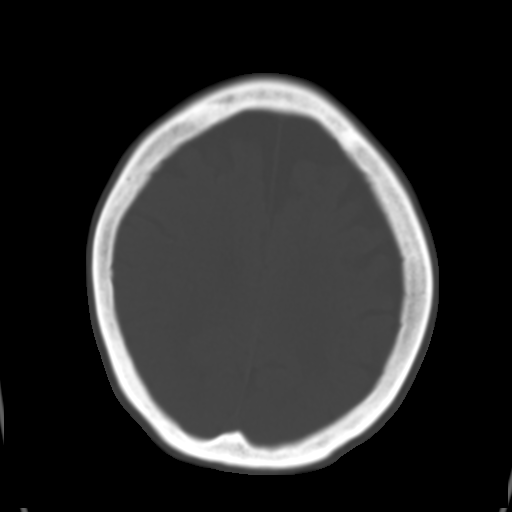
[im 24/32  brain]
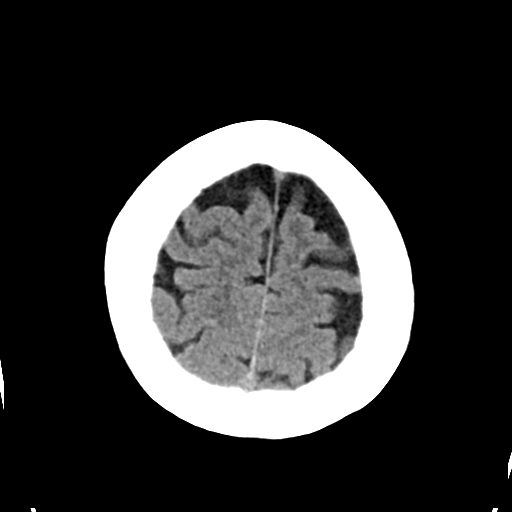
[im 28/32  brain]
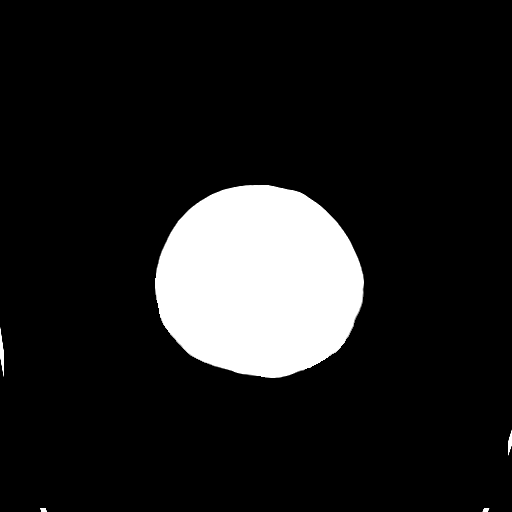

[Series 4: head bone · axial · 0.40mm/px · z∈[-174,-142]mm · 3 of 80 slices shown]
[im 8/80  bone]
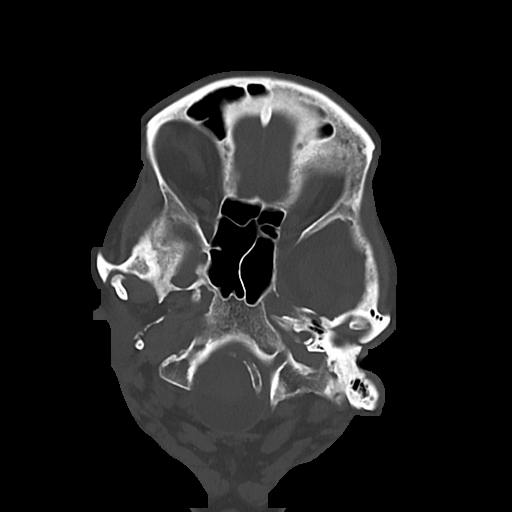
[im 16/80  bone]
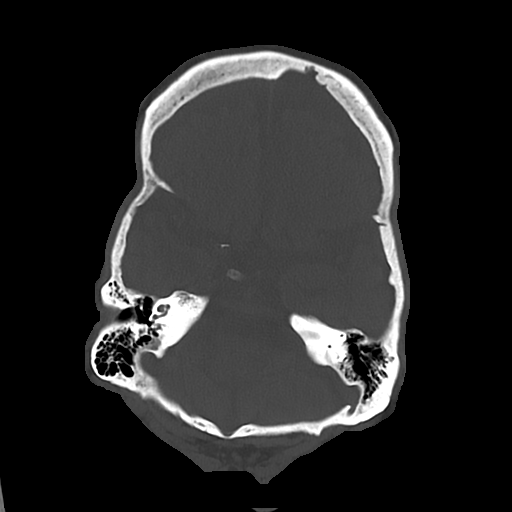
[im 24/80  bone]
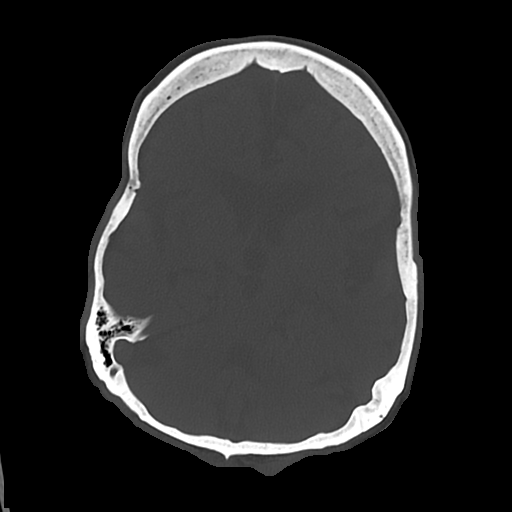

[Series 5: cor soft · coronal · 0.31mm/px · 3 of 65 slices shown]
[im 22/65  brain]
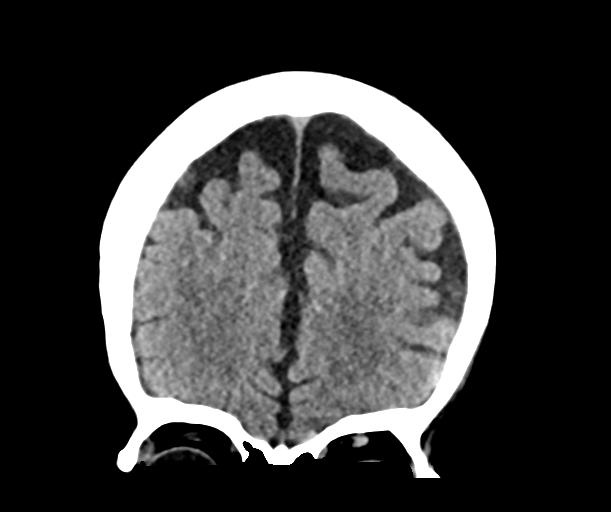
[im 29/65  brain]
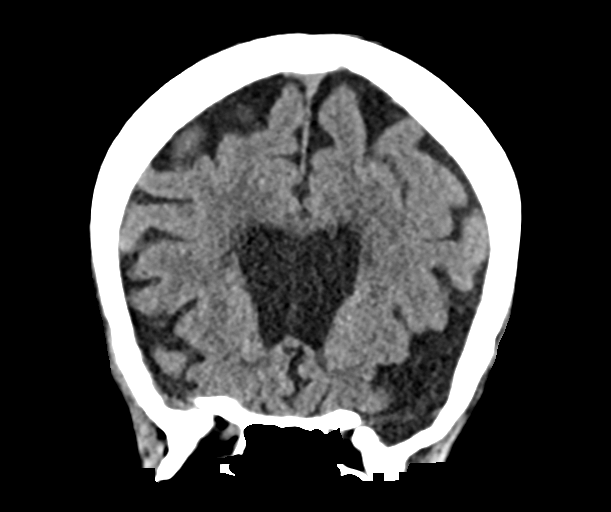
[im 36/65  brain]
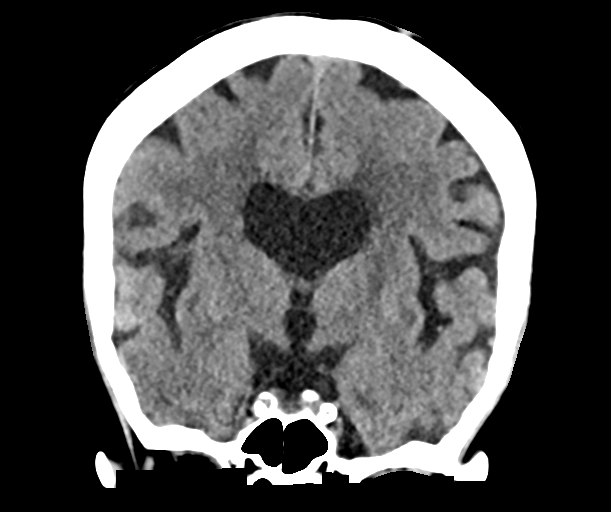

[Series 6: sag soft · sagittal · 0.31mm/px · 3 of 53 slices shown]
[im 18/53  brain]
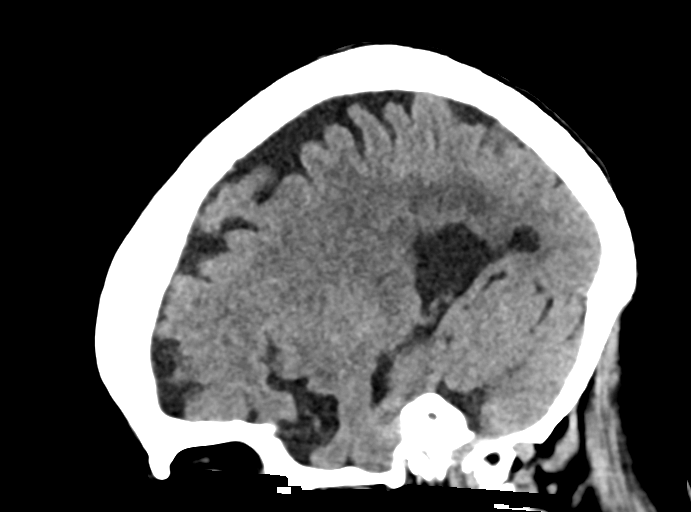
[im 27/53  brain]
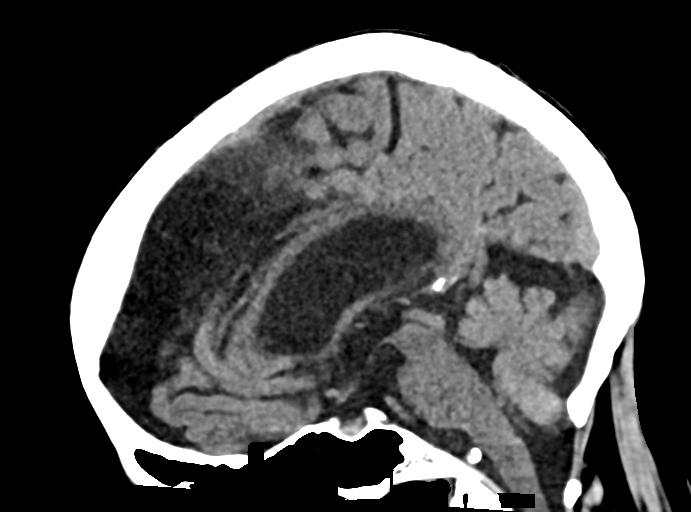
[im 35/53  brain]
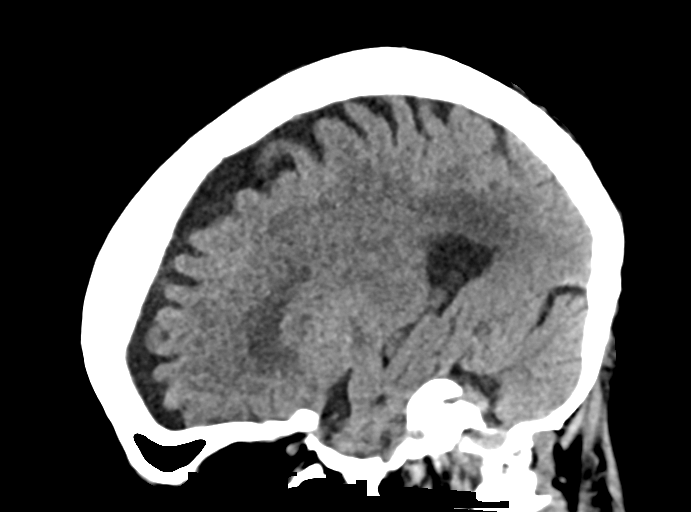

[16 of 47 positions shown; findings below may reference images not displayed]

FINDINGS: Brain: No evidence of acute intracranial hemorrhage or extra-axial
collection.Small areas of ischemia in the bilateral small corona
radiata best seen on prior MRI.The ventricles are unchanged in
size.Scattered subcortical and periventricular white matter
hypodensities, nonspecific but likely sequela of chronic small
vessel ischemic disease.Mild cerebral atrophy

Vascular: No hyperdense vessel.

Skull: Negative.

Sinuses/Orbits: Focal nodular mucosal thickening in the right
frontal sinus. Mastoid air cells are clear. Orbits are unremarkable.

Other: None.
IMPRESSION: Small areas of ischemia in the bilateral corona radiata better seen
on prior MRI. No new intracranial abnormality.

## 2021-06-17 MED ORDER — HYDRALAZINE HCL 50 MG PO TABS: 50.0000 mg | ORAL_TABLET | Freq: Three times a day (TID) | ORAL | Status: DC

## 2021-06-17 MED ORDER — SODIUM CHLORIDE 0.9 % IV BOLUS
1000.0000 mL | Freq: Once | INTRAVENOUS | Status: AC
  Administered 2021-06-17: 1000 mL via INTRAVENOUS

## 2021-06-17 MED ORDER — HYDRALAZINE HCL 25 MG PO TABS
25.0000 mg | ORAL_TABLET | Freq: Once | ORAL | Status: AC
  Administered 2021-06-17: 25 mg via ORAL
  Filled 2021-06-17: qty 1

## 2021-06-17 MED ORDER — SODIUM CHLORIDE 0.9 % IV BOLUS: 500.0000 mL | Freq: Once | INTRAVENOUS | Status: DC

## 2021-06-17 MED ORDER — SODIUM CHLORIDE 0.9 % IV BOLUS
500.0000 mL | Freq: Once | INTRAVENOUS | Status: AC
  Administered 2021-06-17: 500 mL via INTRAVENOUS

## 2021-06-17 NOTE — Progress Notes (Signed)
PT Cancellation Note  Patient Details Name: Nicole Mercado MRN: [id number] DOB: 1940/05/12   Cancelled Treatment:    Reason Eval/Treat Not Completed: Medical issues which prohibited therapy. Pt with episode of decr responsiveness. Rapid response called.    Shary Decamp [facility] 06/17/2021, 1:10 PM [place] Office [phone]

## 2021-06-17 NOTE — TOC Benefit Eligibility Note (Signed)
Patient Research scientist (life sciences) completed.     The patient is currently admitted and upon discharge could be taking Pradaxa.   The current 30 day co-pay is, $26.54.   The patient is insured through [email or web address].

## 2021-06-17 NOTE — Progress Notes (Signed)
Speech Language Pathology Treatment: Dysphagia  Patient Details Name: Nicole Mercado MRN: [id number] DOB: 11/03/1940 Today's Date: 06/17/2021 Time: 8185-6314 SLP Time Calculation (min) (ACUTE ONLY): 12 min  Assessment / Plan / Recommendation Clinical Impression  Pt on ST caseload however asked to diagnostically assess swallow due to episode of decreased responsiveness this morning due to hypotension. Adequately awake for majority of the session and able to state her chin tuck strategy when asked. She consumed thin via cup and straw with head not quite fully tucked and visual/verbal feedback provided. Swallow appeared to be coordinated and timely with one questionable delay without coughing. Throat clear x 1 after solid. She began to get sleepy at end of session and laid back down. Continue Dys 3/thin liquid, meds whole in puree, chin tuck with liquids with full supervision. ST will follow.    HPI HPI: Pt is an 81 yo female presenting from [facility] with aphasia. MRI showed small areas of acute or early subacute ischemia within both corona radiata. Previous clinical swallow eval last 05/17/21 revealed intermittent coughing with thin liquids. MBS 05/20/21 revealed mild oral deficits and moderate vallecular residue that was effective at clearing residue. Trace, sensed aspiration occurred x1 before the swallow but did not happen again despite challenging. Dys 3 solids and thin liquids with chin tuck recommended, although there was concern given cognitive status that she might need help wtih training to use strategy. PMH includes: a fib, aortic stenosis s/p PVR, CAD s/p CABG, CAD s/p CEA, PAD, HLD, CLL, and chronic thrombocytopenia and a recent admission for a TIA. Pt had episode of decreased responsiveness due to hypotension and asked ST to re-assess.      SLP Plan  Continue with current plan of care      Recommendations for follow up therapy are one component of a multi-disciplinary discharge  planning process, led by the attending physician.  Recommendations may be updated based on patient status, additional functional criteria and insurance authorization.    Recommendations  Diet recommendations: Dysphagia 3 (mechanical soft);Thin liquid;Other(comment) (chin tuck with liquids) Liquids provided via: Cup;Straw Medication Administration: Whole meds with puree Supervision: Staff to assist with self feeding;Full supervision/cueing for compensatory strategies Compensations: Chin tuck (wit liquids) Postural Changes and/or Swallow Maneuvers: Seated upright 90 degrees                Oral Care Recommendations: Oral care BID Follow Up Recommendations: Skilled nursing-short term rehab (<3 hours/day) Assistance recommended at discharge: Frequent or constant Supervision/Assistance SLP Visit Diagnosis: Dysphagia, unspecified (R13.10) Plan: Continue with current plan of care           [name]  06/17/2021, 1:25 PM

## 2021-06-17 NOTE — Progress Notes (Signed)
IMTS Brief Progress Note  S: Paged by RN at 10:03 that patient was more nonverbal and aphasic. Rapid was called. BP on arrival was 101/63 and patient was not responding to noxious stimuli. Trendelenburg was performed and stat head CT for neuro worsening protocol order was placed.  Patient's mental status appeared to begin to improve enroute to CT. Head CT showed no acute intracranial abnormalities (no signs of hemorrhage).  Patient was reassessed after returning to room and appeared fatigued.   O: VS: BP 101/63, HR 94, RR 14, SpO2 99% on room air CBG: 186  Somnolent. Left facial droop (unchanged from admission). New RUE pronator drift, Left gaze preference, and aphasia.   A/P: Hypoperfusion secondary to blood pressure medications vs recrudescence of stroke. Less likely to be new, acute CVA given response to trendelenburg. Persistent new RUE drift is concerning given prior.  -Trendelenburg -500 ml NS IVF -Stroke team reconsulted for any further recommendations -Holding evening BP medications to allow for improved brain perfusion -Consider MRI to assess for new acute CVA if symptoms persist - If condition changes, plan includes bedside re-evaluation.   France Ravens, MD 06/17/2021, 11:43 AM PGY-1, [employer] IMTS  Pager: [phone]

## 2021-06-17 NOTE — Plan of Care (Signed)
  Problem: Health Behavior/Discharge Planning: Goal: Ability to manage health-related needs will improve Outcome: Progressing   Problem: Nutrition: Goal: Adequate nutrition will be maintained Outcome: Progressing   

## 2021-06-17 NOTE — Care Management Important Message (Signed)
Important Message  Patient Details  Name: Nicole Mercado MRN: [id number] Date of Birth: 1940/04/06   Medicare Important Message Given:  Yes     [name] 06/17/2021, 4:00 PM

## 2021-06-17 NOTE — Significant Event (Addendum)
Rapid Response Event Note   Reason for Call :  Acute neurological change in the setting of recent CVA and SBP drop from 160 to 101 following increase in anti-hypertensive.   Initial Focused Assessment:  Pt lying in bed, HOB at 40 degrees. Eyes are open, but she is not interacting with staff or following commands. Full NIHSS and VS documented in flowsheet. PERRLA, 76m. EOMI.   Skin is warm, dry, pink. She does not appear to be in distress.   VS: BP 101/63, HR 94, RR 14, SpO2 99% on room air CBG: 186  Interventions:  -Trendelenburg -STAT CT head per neuro worsening protocol  Plan of Care:  -q2h NIHSS and VS -Repeat swallow screen once patient is more awake -Hold anti-hypertensive medication until specified by primary MD -500 cc IVF bolus  Call rapid response for additional needs  Event Summary:  MD Notified: IMTS at bedside Call Time: 1[place]Time: 1012 End Time: 1130   .  SCasimer Bilis RN

## 2021-06-17 NOTE — Progress Notes (Signed)
STROKE TEAM PROGRESS NOTE   INTERVAL HISTORY She had an episode this morning of being unresponsive after being set up in bed for 10 minutes and neurology was asked to evaluate again.  She improved after being placed in Trendelenburg.  Stat CT was completed which is stable.  She appears now to be back to baseline. Vitals:   06/17/21 1351 06/17/21 1356 06/17/21 1406 06/17/21 1413  BP: (!) 125/51 (!) 118/48 (!) 110/50 (!) 131/46  Pulse:      Resp: 18     Temp: (!) 97.5 F (36.4 C)     TempSrc: Oral     SpO2: 98%     Weight:      Height:       CBC:  Recent Labs  Lab 06/12/21 2255 06/14/21 0549 06/15/21 0014 06/16/21 0028  WBC 7.6   < > 6.5 5.2  NEUTROABS 4.0  --   --   --   HGB 13.9   < > 12.5 13.0  HCT 41.5   < > 37.8 37.4  MCV 95.2   < > 94.0 91.4  PLT 115*   < > 114* 113*   < > = values in this interval not displayed.    Basic Metabolic Panel:  Recent Labs  Lab 06/16/21 0028 06/17/21 0411  NA 139 137  K 3.6 4.8  CL 103 103  CO2 25 23  GLUCOSE 114* 93  BUN 20 21  CREATININE 1.27* 1.32*  CALCIUM 9.8 9.9    Lipid Panel: No results for input(s): "CHOL", "TRIG", "HDL", "CHOLHDL", "VLDL", "LDLCALC" in the last 168 hours. HgbA1c: No results for input(s): "HGBA1C" in the last 168 hours. Urine Drug Screen:  Recent Labs  Lab 06/13/21 0541  LABOPIA NONE DETECTED  COCAINSCRNUR NONE DETECTED  LABBENZ NONE DETECTED  AMPHETMU NONE DETECTED  THCU NONE DETECTED  LABBARB NONE DETECTED     Alcohol Level No results for input(s): "ETH" in the last 168 hours.  IMAGING past 24 hours CT HEAD WO CONTRAST (5MM)  Result Date: 06/17/2021 CLINICAL DATA:  Neuro deficit, acute, stroke suspected EXAM: CT HEAD WITHOUT CONTRAST TECHNIQUE: Contiguous axial images were obtained from the base of the skull through the vertex without intravenous contrast. RADIATION DOSE REDUCTION: This exam was performed according to the departmental dose-optimization program which includes automated  exposure control, adjustment of the mA and/or kV according to patient size and/or use of iterative reconstruction technique. COMPARISON:  Brain MRI 06/13/2021, CT 06/12/2021 FINDINGS: Brain: No evidence of acute intracranial hemorrhage or extra-axial collection.Small areas of ischemia in the bilateral small corona radiata best seen on prior MRI.The ventricles are unchanged in size.Scattered subcortical and periventricular white matter hypodensities, nonspecific but likely sequela of chronic small vessel ischemic disease.Mild cerebral atrophy Vascular: No hyperdense vessel. Skull: Negative. Sinuses/Orbits: Focal nodular mucosal thickening in the right frontal sinus. Mastoid air cells are clear. Orbits are unremarkable. Other: None. IMPRESSION: Small areas of ischemia in the bilateral corona radiata better seen on prior MRI. No new intracranial abnormality. Electronically Signed   By: Maurine Simmering M.D.   On: 06/17/2021 11:12    PHYSICAL EXAM Physical Exam  Constitutional: Appears well-developed and well-nourished.  Elderly Caucasian lady Cardiovascular: Irregularly irregular Respiratory: Effort normal, non-labored breathing   Neuro: Mental Status: Patient is awake, alert.  Disoriented to time place and person.   Unable to give any significant history.   She is able to name objects and follows commands speech is slightly dysarthric Cranial Nerves: II: Visual Fields  are full to orienting to stimuli in all quadrants. Pupils are equal, round, and reactive to light.   III,IV, VI: EOMI, tracking examiner V: Facial sensation is symmetric VII: Facial movement is symmetric.  XI: Shoulder shrug is symmetric. XII: tongue is midline without atrophy or fasciculations.  Motor: Tone is normal. Bulk is normal.   BUE antigravity.  We will lift both legs slightly off the bed for limited time. Sensory: Equally reactive to light noxious stimulus in all 4 extremities Plantar reflexes: Upgoing  bilaterally Cerebellar: FNF and HKS are intact bilaterally  ASSESSMENT/PLAN [name] is a 81 y.o. 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   Hypoperfusion event today.  CT is unremarkable.  She is on Eliquis due to atrial fibrillation.  No need for further work-up as she is back to baseline and there would not likely result in a change in management.  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 CTA head & neck Moderate narrowing of the distal left PCA P2 segment. MRI  small infarcts in the bilateral corona radiata 2D Echo EF 55-60% TEE 04/04/2021- A left atrial/left atrial appendage thrombus was detected LDL 63 HgbA1c 5.0 VTE prophylaxis - SCDs    Diet   DIET DYS 3 Room service appropriate? No; Fluid consistency: Thin   Eliquis (apixaban) daily prior to admission, now on No antithrombotic. Resume eliquis when medically able or switch to Pradaxa if she can afford the co-pay. 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 After hours, contact General Neurology

## 2021-06-17 NOTE — Progress Notes (Signed)
HD#4 Subjective:  Overnight Events: BP up to 185/65. NAEON. No reports of agitation.  Patient seen and assessed at bedside. Patient was initially asleep but appropriately woke up and responded appropriately.   Objective:  Vital signs in last 24 hours: Vitals:   06/16/21 2019 06/16/21 2020 06/16/21 2344 06/17/21 0352  BP:  (!) 145/58 (!) 168/67 (!) 185/65  Pulse:  65 66   Resp:   16   Temp: 98.6 F (37 C)  97.7 F (36.5 C) 98.6 F (37 C)  TempSrc: Oral  Oral Oral  SpO2:  100% 100%   Weight:      Height:       Supplemental O2: Room Air SpO2: 100 %   Physical Exam:  Physical Exam Constitutional:      General: She is not in acute distress.    Appearance: She is not ill-appearing.     Comments: Pleasantly caucasian elderly female.  HENT:     Head: Normocephalic and atraumatic.  Eyes:     Extraocular Movements: Extraocular movements intact.     Pupils: Pupils are equal, round, and reactive to light.  Cardiovascular:     Rate and Rhythm: Normal rate and regular rhythm.     Pulses: Normal pulses.     Heart sounds: Normal heart sounds.  Pulmonary:     Effort: Pulmonary effort is normal.     Breath sounds: Normal breath sounds.  Abdominal:     General: Abdomen is flat. There is no distension.     Palpations: Abdomen is soft. There is no mass.  Skin:    General: Skin is warm and dry.     Comments: Left foot hematoma on dorsal side. No surrounding erythema noted. Feet are both warm and dry to touch  Neurological:     Mental Status: She is alert.     Comments: Alert to self and location but not time or context.  Strength: 5/5 in all extremities Sensation: symmetrical and appropriate      Filed Weights   06/12/21 2328  Weight: 41.4 kg     Intake/Output Summary (Last 24 hours) at 06/17/2021 0655 Last data filed at 06/17/2021 0418 Gross per 24 hour  Intake --  Output 1300 ml  Net -1300 ml    Net IO Since Admission: -2,063.56 mL [06/17/21 0655]  Pertinent  Labs:    Latest Ref Rng & Units 06/16/2021   12:28 AM 06/15/2021   12:14 AM 06/14/2021    5:49 AM  CBC  WBC 4.0 - 10.5 K/uL 5.2  6.5  5.7   Hemoglobin 12.0 - 15.0 g/dL 13.0  12.5  14.7   Hematocrit 36.0 - 46.0 % 37.4  37.8  44.6   Platelets 150 - 400 K/uL 113  114  119        Latest Ref Rng & Units 06/17/2021    4:11 AM 06/16/2021   12:28 AM 06/15/2021   12:14 AM  CMP  Glucose 70 - 99 mg/dL 93  114  100   BUN 8 - 23 mg/dL '21  20  27   '$ Creatinine 0.44 - 1.00 mg/dL 1.32  1.27  1.45   Sodium 135 - 145 mmol/L 137  139  137   Potassium 3.5 - 5.1 mmol/L 4.8  3.6  3.6   Chloride 98 - 111 mmol/L 103  103  105   CO2 22 - 32 mmol/L '23  25  23   '$ Calcium 8.9 - 10.3 mg/dL 9.9  9.8  9.6     Imaging: No results found.  Assessment/Plan:   Principal Problem:   Acute ischemic stroke [facility])   Patient Summary: Nicole Mercado is a 81 y.o. with pertinent PMH of atrial fibrillation on Eliquis, aortic stenosis s/p prosthetic valve replacement, left atrial appendage thrombus, CAD s/p CABG, carotid artery disease s/p CEA, PAD, hyperlipidemia, CLL, and chronic thrombocytopenia and a recent admission for a TIA admitted with a new acute CVA.   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 Home med: diltiazem 120 mg qd, metoprolol tartrate 100 mg bid -Continue to monitor -Increase hydralazine from 25 to 50 mg tid today -Consider  losartan if kidney function improves  Foot Hematoma Stable, not erythematous -Continue to monitor  Elevated Cr CKD Hypokalemia, resolved Cr 1.27>1.32. Baseline GFR 50. -Avoid nephrotoxic agents -Daily BMP -Encourage PO intake.   Chronic thrombocytopenia, Stable - CBC qod  CAD s/p CABG in 2016; PAD; HLD; Aortic atherosclerosis Carotid artery disease s/p CEA in 2013 -Continue to monitor  Small Left Pleural Effusion Persistent since even as early as 2016 -Outpatient monitoring  Diet: Dysphagia 3 IVF: none VTE: Dabigatran Code: DNR/DNI PT/OT recs: SNF, none. TOC recs: pending   Dispo: Anticipated discharge to  SNF  in 3 days pending stroke workup and able to tolerate PO.   France Ravens, MD 06/17/2021, 6:55 AM Pager: [phone]  Please contact the on call pager after 5 pm and on weekends at [phone].

## 2021-06-17 NOTE — Progress Notes (Signed)
Dr. Lurline Hare notified that patient had similar episode to this morning when I sat the patient up in bed after about 10 minutes. Patient improved after placing her in trendelenburg.

## 2021-06-18 DIAGNOSIS — I639 Cerebral infarction, unspecified: Secondary | ICD-10-CM | POA: Diagnosis not present

## 2021-06-18 DIAGNOSIS — E43 Unspecified severe protein-calorie malnutrition: Secondary | ICD-10-CM | POA: Insufficient documentation

## 2021-06-18 LAB — BASIC METABOLIC PANEL
Anion gap: 9 (ref 5–15)
BUN: 16 mg/dL (ref 8–23)
CO2: 24 mmol/L (ref 22–32)
Calcium: 9.7 mg/dL (ref 8.9–10.3)
Chloride: 103 mmol/L (ref 98–111)
Creatinine, Ser: 1.11 mg/dL — ABNORMAL HIGH (ref 0.44–1.00)
GFR, Estimated: 50 mL/min — ABNORMAL LOW (ref >60)
Glucose, Bld: 98 mg/dL (ref 70–99)
Potassium: 4 mmol/L (ref 3.5–5.1)
Sodium: 136 mmol/L (ref 135–145)

## 2021-06-18 MED ORDER — ENSURE ENLIVE PO LIQD
237.0000 mL | Freq: Two times a day (BID) | ORAL | Status: DC
  Administered 2021-06-18 – 2021-06-20 (×4): 237 mL via ORAL

## 2021-06-18 MED ORDER — LACTATED RINGERS IV BOLUS
500.0000 mL | Freq: Once | INTRAVENOUS | Status: AC
Start: 2021-06-18 — End: 2021-06-18
  Administered 2021-06-18: 500 mL via INTRAVENOUS

## 2021-06-18 MED ORDER — CARVEDILOL 12.5 MG PO TABS: 12.5000 mg | ORAL_TABLET | Freq: Two times a day (BID) | ORAL | Status: DC

## 2021-06-18 NOTE — Progress Notes (Signed)
Initial Nutrition Assessment  DOCUMENTATION CODES:  Underweight, Severe malnutrition in context of social or environmental circumstances  INTERVENTION:  Continue current diet as ordered, encourage PO intake Ensure Enlive po BID, each supplement provides 350 kcal and 20 grams of protein. MVI with minerals daily  NUTRITION DIAGNOSIS:  Severe Malnutrition (in the context of social/environmental factors) related to  (inadequate energy intake) as evidenced by energy intake < or equal to 75% for > or equal to 1 month, severe muscle depletion.  GOAL:  Patient will meet greater than or equal to 90% of their needs  MONITOR:  PO intake, Labs, Supplement acceptance, Weight trends  REASON FOR ASSESSMENT:  Consult Poor PO  ASSESSMENT:  Pt with hx of HTN, CLL, CAD s/p CABG, atrial fibrillation, PAD, HTN and HLD presented to ED as a code stroke with AMS and aphasia. Imaging revealed acute/subacute infarcts within bilateral corona radiata.    6/9 - SLP evaluation, Diet recommendations: DYS3/Thin  Pt resting in bed at the time of assessment. Reports a good appetite this admission. Breakfast tray noted at bedside almost 100% consumed.  Inquired about intake and recent weight hx at home. 3.9% weight loss noted in the last 3 months which is not severe, but is concerning due to pt's age and underweight BMI. Pt endorses this loss and states that she has been about 95 lb recently but was 110 lb prior to loss which has occurred over the last few months per pt. Pt thin on exam with significant muscle and fat deficits present.  When asking about intake at home, pt reports that she has a good appetite. However, when asked about typical meals at home, it appears that a low amount of kcal is consumed and is inadequate to meet estimated needs. Breakfast: 2 pieces of toast with "whatever I have" on it ie jelly, butter, etc Lunch: "about the same as breakfast" Dinner: "not much" Pt typically drinks water with  her meals and throughout the day.  Discussed the importance of nutrition with pt on recovery and in preventing loss of lean body mass. Pt agreeable to receiving ensure chocolate  Pt reports she ambulates independently and lives alone at baseline. Noted SNF is recommended at discharge.  Average Meal Intake: 6/9-6/12: 65% intake x 9 recorded meals  Nutritionally Relevant Medications: Scheduled Meds:  dabigatran  75 mg Oral Q12H   diltiazem  180 mg Oral Daily   Labs Reviewed: Creatinine 1.11  NUTRITION - FOCUSED PHYSICAL EXAM: Flowsheet Row Most Recent Value  Orbital Region Moderate depletion  Upper Arm Region Mild depletion  Thoracic and Lumbar Region Moderate depletion  Buccal Region Mild depletion  Temple Region Moderate depletion  Clavicle Bone Region Severe depletion  Clavicle and Acromion Bone Region Severe depletion  Scapular Bone Region Severe depletion  Dorsal Hand Moderate depletion  Patellar Region Severe depletion  Anterior Thigh Region Severe depletion  Posterior Calf Region Severe depletion  Edema (RD Assessment) None  Hair Reviewed  Eyes Reviewed  Mouth Reviewed  Skin Reviewed  Nails Reviewed   Diet Order:   Diet Order             DIET DYS 3 Room service appropriate? No; Fluid consistency: Thin  Diet effective now                   EDUCATION NEEDS:  Education needs have been addressed  Skin:  Skin Assessment: Reviewed RN Assessment (scattered abrasions and ecchymosis, hematoma anterior aspect of foot)  Last BM:  6/11 - type 3  Height:  Ht Readings from Last 1 Encounters:  06/12/21 5' (1.524 m)   Weight:  Wt Readings from Last 1 Encounters:  06/12/21 41.4 kg   Ideal Body Weight:  45.5 kg  BMI:  Body mass index is 17.83 kg/m.  Estimated Nutritional Needs:  Kcal:  1300-1500 kcal/d Protein:  65-80g/d Fluid:  >/=1.5L/d   Ranell Patrick, RD, LDN Clinical Dietitian RD pager # available in [facility]  After hours/weekend pager # available  in [facility]

## 2021-06-18 NOTE — Progress Notes (Signed)
Patient unable to stand 3 minutes for to do full set of ortho vs.  Orlyn Odonoghue, Tivis Ringer, RN

## 2021-06-18 NOTE — Progress Notes (Addendum)
HD#5 Subjective:  Overnight Events: BP up to 185/65. NAEON. No reports of agitation.  Patient seen and assessed at bedside. Reports doing well. No acute concerns at this time.  Medically stable for SNF placement  Objective:  Vital signs in last 24 hours: Vitals:   06/17/21 2008 06/17/21 2100 06/17/21 2319 06/18/21 0300  BP: (!) 149/55 (!) 154/56 (!) 189/68 (!) 178/62  Pulse: 77 80 75 82  Resp: '16 15 12 14  '$ Temp: 97.9 F (36.6 C) 98.3 F (36.8 C) 98.3 F (36.8 C) 98.4 F (36.9 C)  TempSrc: Oral Oral Axillary Axillary  SpO2:      Weight:      Height:       Supplemental O2: Room Air SpO2: 97 %   Physical Exam:  Physical Exam Constitutional:      General: She is not in acute distress.    Appearance: She is not ill-appearing.     Comments: Pleasantly caucasian elderly female.  HENT:     Head: Normocephalic and atraumatic.  Eyes:     Extraocular Movements: Extraocular movements intact.     Pupils: Pupils are equal, round, and reactive to light.  Cardiovascular:     Rate and Rhythm: Normal rate and regular rhythm.     Pulses: Normal pulses.     Heart sounds: Normal heart sounds.  Pulmonary:     Effort: Pulmonary effort is normal.     Breath sounds: Normal breath sounds.  Abdominal:     General: Abdomen is flat. There is no distension.     Palpations: Abdomen is soft. There is no mass.  Skin:    General: Skin is warm and dry.     Comments: Left foot hematoma on dorsal side. No surrounding erythema noted. Feet are both warm and dry to touch  Neurological:     Mental Status: She is alert.     Comments: Alert to self and location but not time or context.  Strength: 5/5 in all extremities Sensation: symmetrical and appropriate     Filed Weights   06/12/21 2328  Weight: 41.4 kg     Intake/Output Summary (Last 24 hours) at 06/18/2021 0723 Last data filed at 06/18/2021 0500 Gross per 24 hour  Intake 1844.45 ml  Output 800 ml  Net 1044.45 ml    Net IO  Since Admission: -1,019.11 mL [06/18/21 0723]  Pertinent Labs:    Latest Ref Rng & Units 06/16/2021   12:28 AM 06/15/2021   12:14 AM 06/14/2021    5:49 AM  CBC  WBC 4.0 - 10.5 K/uL 5.2  6.5  5.7   Hemoglobin 12.0 - 15.0 g/dL 13.0  12.5  14.7   Hematocrit 36.0 - 46.0 % 37.4  37.8  44.6   Platelets 150 - 400 K/uL 113  114  119        Latest Ref Rng & Units 06/18/2021    6:04 AM 06/17/2021    4:11 AM 06/16/2021   12:28 AM  CMP  Glucose 70 - 99 mg/dL 98  93  114   BUN 8 - 23 mg/dL '16  21  20   '$ Creatinine 0.44 - 1.00 mg/dL 1.11  1.32  1.27   Sodium 135 - 145 mmol/L 136  137  139   Potassium 3.5 - 5.1 mmol/L 4.0  4.8  3.6   Chloride 98 - 111 mmol/L 103  103  103   CO2 22 - 32 mmol/L 24  23  25  Calcium 8.9 - 10.3 mg/dL 9.7  9.9  9.8     Imaging: CT HEAD WO CONTRAST (5MM)  Result Date: 06/17/2021 CLINICAL DATA:  Neuro deficit, acute, stroke suspected EXAM: CT HEAD WITHOUT CONTRAST TECHNIQUE: Contiguous axial images were obtained from the base of the skull through the vertex without intravenous contrast. RADIATION DOSE REDUCTION: This exam was performed according to the departmental dose-optimization program which includes automated exposure control, adjustment of the mA and/or kV according to patient size and/or use of iterative reconstruction technique. COMPARISON:  Brain MRI 06/13/2021, CT 06/12/2021 FINDINGS: Brain: No evidence of acute intracranial hemorrhage or extra-axial collection.Small areas of ischemia in the bilateral small corona radiata best seen on prior MRI.The ventricles are unchanged in size.Scattered subcortical and periventricular white matter hypodensities, nonspecific but likely sequela of chronic small vessel ischemic disease.Mild cerebral atrophy Vascular: No hyperdense vessel. Skull: Negative. Sinuses/Orbits: Focal nodular mucosal thickening in the right frontal sinus. Mastoid air cells are clear. Orbits are unremarkable. Other: None. IMPRESSION: Small areas of ischemia  in the bilateral corona radiata better seen on prior MRI. No new intracranial abnormality. Electronically Signed   By: Maurine Simmering M.D.   On: 06/17/2021 11:12    Assessment/Plan:   Principal Problem:   Acute ischemic stroke [facility])   Patient Summary: Nicole Mercado is a 81 y.o. with pertinent PMH of atrial fibrillation on Eliquis, aortic stenosis s/p prosthetic valve replacement, left atrial appendage thrombus, CAD s/p CABG, carotid artery disease s/p CEA, PAD, hyperlipidemia, CLL, and chronic thrombocytopenia and a recent admission for a TIA admitted with a new acute CVA.  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 Will ensure adequate PO intake -Dysphagia 3 with whole meds per SLP recommendations -PT/OT (recommend SNF) -[facility] pending  -Pradaxa 75 mg bid -RD consult for nutrition needs   Hospital Delirium -Delirium Precautions -Hold Trazodone 25 mg qhs due to somnolence -1:1 sitter if continues to be agitated -Seroquel prn if refractory agitated  Atrial Fibrillation, now in RVR Aortic stenosis s/p prosthetic valve replacement in 2016 Left atrial thrombus (noted on TEE in 03/2021) -Pradaxa for secondary stroke prevention -Continue Coreg 25 mg bid -Continue home diltiazem 120 mg qd  HTN Home med: diltiazem 120 mg qd, metoprolol tartrate 100 mg bid -Goal SBP <180 -Orthostatics vitals today -Continue to monitor -Resume Coreg 12.5 bid wc -Outpatient management for BP control -Continue home diltiazem 120 mg qd  Foot Hematoma Stable, not erythematous -Continue to monitor  Elevated Cr, resolved CKD Hypokalemia, resolved Back to baseline Cr -Avoid nephrotoxic agents -Daily BMP -Encourage PO intake.   Chronic thrombocytopenia, Stable - CBC qod  CAD s/p CABG in 2016; PAD; HLD; Aortic atherosclerosis Carotid artery disease s/p CEA in 2013 -Continue to monitor  Small Left Pleural  Effusion Persistent since even as early as 2016 -Outpatient monitoring  Diet: Dysphagia 3 IVF: none VTE: Dabigatran Code: DNR/DNI PT/OT recs: SNF, none. TOC recs: pending   Dispo: Anticipated discharge to  SNF  in 1 days pending SNF placement  France Ravens, MD 06/18/2021, 7:23 AM Pager: [phone]  Please contact the on call pager after 5 pm and on weekends at [phone].

## 2021-06-19 MED ORDER — LACTATED RINGERS IV BOLUS
1000.0000 mL | Freq: Once | INTRAVENOUS | Status: AC
Start: 2021-06-19 — End: 2021-06-19
  Administered 2021-06-19: 1000 mL via INTRAVENOUS

## 2021-06-19 MED ORDER — TRAZODONE HCL 50 MG PO TABS: 25.0000 mg | ORAL_TABLET | Freq: Every day | ORAL | Status: AC

## 2021-06-19 MED ORDER — LACTATED RINGERS IV BOLUS: 500.0000 mL | Freq: Once | INTRAVENOUS | Status: DC

## 2021-06-19 MED ORDER — CARVEDILOL 3.125 MG PO TABS: 3.1250 mg | ORAL_TABLET | Freq: Two times a day (BID) | ORAL | Status: DC

## 2021-06-19 MED ORDER — LOSARTAN POTASSIUM 25 MG PO TABS
25.0000 mg | ORAL_TABLET | Freq: Every day | ORAL | Status: DC
  Administered 2021-06-19 – 2021-06-20 (×2): 25 mg via ORAL
  Filled 2021-06-19 (×2): qty 1

## 2021-06-19 MED ORDER — DABIGATRAN ETEXILATE MESYLATE 75 MG PO CAPS: 75.0000 mg | ORAL_CAPSULE | Freq: Two times a day (BID) | ORAL | Status: AC

## 2021-06-19 NOTE — Evaluation (Addendum)
Occupational Therapy Evaluation Patient Details Name: Nicole Mercado MRN: [id number] DOB: [birth year] Today's Date: 06/19/2021   History of Present Illness 81 yo female presenting to ED on 6/8 with aphasia and AMS. MRI showing small areas of acute or early subacute ischemia within both corona radiata. PMH including afib, aortic stenosis s/p prosthetic valve, CAD s/p CABG, carotid artery disease s/p CEA, PAD, hyperlipidemia, CLL, chronic thrombocytopenia, and recent admission for TIA.   Clinical Impression   Pt making slow progress towards goals, disoriented to time this session and needing increased cuing to follow commands. Pt  mod A for bed mobilty, max A for pericare in standing, and mod A +2 for step pivot transfer to chair. Pt with posterior lean in sitting and standing. Orthostatics taken (see below & vitals flowsheet), pt asymptomatic. Pt presenting with impairments listed below, will follow acutely. Continue to recommend SNF at d/c.  BP supine: 151/61 (85) BP seated 155/85 BP standing: 112/68 (82)  BP standing x3 mins: 123/64 (81)   Recommendations for follow up therapy are one component of a multi-disciplinary discharge planning process, led by the attending physician.  Recommendations may be updated based on patient status, additional functional criteria and insurance authorization.   Follow Up Recommendations  Skilled nursing-short term rehab (<3 hours/day)    Assistance Recommended at Discharge Frequent or constant Supervision/Assistance  Patient can return home with the following A lot of help with walking and/or transfers;A lot of help with bathing/dressing/bathroom;Direct supervision/assist for medications management;Direct supervision/assist for financial management;Assistance with cooking/housework;Assist for transportation    Functional Status Assessment     Equipment Recommendations  None recommended by OT;Other (comment) (defer to next venue of care)     Recommendations for Other Services PT consult;Speech consult     Precautions / Restrictions Precautions Precautions: Fall Restrictions Weight Bearing Restrictions: No      Mobility Bed Mobility Overal bed mobility: Needs Assistance Bed Mobility: Supine to Sit     Supine to sit: Mod assist     General bed mobility comments: for trunk elevation and scooting pt to EOB with use of pad    Transfers Overall transfer level: Needs assistance Equipment used: 2 person hand held assist Transfers: Sit to/from Stand Sit to Stand: Min assist, Mod assist, +2 physical assistance     Step pivot transfers: Mod assist, +2 physical assistance     General transfer comment: cues to remain upright throughout      Balance Overall balance assessment: Needs assistance Sitting-balance support: Bilateral upper extremity supported, No upper extremity supported, Feet supported Sitting balance-Leahy Scale: Poor Sitting balance - Comments: faciliation for upright and midline posture. patient required perods of Min A to maintain midline due to right and posterior lean. more pronounced with fatigue Postural control: Posterior lean, Right lateral lean Standing balance support: No upper extremity supported Standing balance-Leahy Scale: Poor Standing balance comment: patient relying on bed for posterior leg support in standing. faciliation for hip extension with cues for upright posture. standing tolerance of around 3 minutes                           ADL either performed or assessed with clinical judgement   ADL Overall ADL's : Needs assistance/impaired                         Toilet Transfer: Moderate assistance;+2 for physical assistance;BSC/3in1;Stand-pivot Toilet Transfer Details (indicate cue type and reason): simulated to  recliner Toileting- Clothing Manipulation and Hygiene: Maximal assistance Toileting - Clothing Manipulation Details (indicate cue type and reason):  standing for pericare     Functional mobility during ADLs: Moderate assistance;+2 for physical assistance       Vision   Vision Assessment?: No apparent visual deficits     Perception Perception Perception: Not tested   Praxis Praxis Praxis: Not tested    Pertinent Vitals/Pain Pain Assessment Pain Assessment: No/denies pain     Hand Dominance     Extremity/Trunk Assessment Upper Extremity Assessment Upper Extremity Assessment: Generalized weakness   Lower Extremity Assessment Lower Extremity Assessment: Defer to PT evaluation       Communication     Cognition Arousal/Alertness: Awake/alert Behavior During Therapy: WFL for tasks assessed/performed Overall Cognitive Status: Impaired/Different from baseline Area of Impairment: Orientation                 Orientation Level: Time Current Attention Level: Sustained Memory: Decreased short-term memory Following Commands: Follows one step commands inconsistently, Follows one step commands with increased time Safety/Judgement: Decreased awareness of safety, Decreased awareness of deficits Awareness: Intellectual Problem Solving: Requires verbal cues, Requires tactile cues, Difficulty sequencing, Slow processing General Comments: pt thinking it is January 1999, reporting she went to her daughter's house yesterday     General Comments  orthostatics taken during session (see vitals flowsheet)    Exercises     Shoulder Instructions      Home Living                                          Prior Functioning/Environment                          OT Problem List:        OT Treatment/Interventions:      OT Goals(Current goals can be found in the care plan section) Acute Rehab OT Goals Patient Stated Goal: to get better OT Goal Formulation: With patient/family Time For Goal Achievement: 06/28/21 Potential to Achieve Goals: Good ADL Goals Pt Will Perform Grooming:  Independently;standing Pt Will Perform Lower Body Bathing: with modified independence;sitting/lateral leans;sit to/from stand Pt Will Perform Upper Body Dressing: with set-up;with supervision;sitting Pt Will Perform Lower Body Dressing: sit to/from stand;with min assist Pt Will Transfer to Toilet: with min guard assist;stand pivot transfer;bedside commode Pt Will Perform Toileting - Clothing Manipulation and hygiene: with modified independence;sitting/lateral leans;sit to/from stand Additional ADL Goal #1: Pt will demonstrate selective attention during ADLs with Min cues Additional ADL Goal #2: pt will demonstrated improved activty tolerance to complete at least 3 grooming tasks in standing with supervision A  OT Frequency: Min 2X/week    Co-evaluation PT/OT/SLP Co-Evaluation/Treatment: Yes Reason for Co-Treatment: For patient/therapist safety;To address functional/ADL transfers PT goals addressed during session: Mobility/safety with mobility OT goals addressed during session: ADL's and self-care      AM-PAC OT "6 Clicks" Daily Activity     Outcome Measure Help from another person eating meals?: A Little Help from another person taking care of personal grooming?: A Little Help from another person toileting, which includes using toliet, bedpan, or urinal?: A Lot Help from another person bathing (including washing, rinsing, drying)?: A Lot Help from another person to put on and taking off regular upper body clothing?: A Little Help from another person to put on and taking off regular  lower body clothing?: A Lot 6 Click Score: 15   End of Session Nurse Communication: Mobility status  Activity Tolerance: Patient tolerated treatment well Patient left: in chair;with call bell/phone within reach;with chair alarm set  OT Visit Diagnosis: Unsteadiness on feet (R26.81);Other abnormalities of gait and mobility (R26.89);Muscle weakness (generalized) (M62.81);Cognitive communication deficit  (R41.841);Other symptoms and signs involving cognitive function Symptoms and signs involving cognitive functions: Cerebral infarction                Time: 8251-8984 OT Time Calculation (min): 28 min Charges:  OT General Charges $OT Visit: 1 Visit OT Treatments $Self Care/Home Management : 8-22 mins  Lynnda Child, OTD, OTR/L Acute Rehab [phone]) [phone] - [place] 06/19/2021, 10:49 AM

## 2021-06-19 NOTE — Discharge Summary (Signed)
Name: Nicole Mercado MRN: [id number] DOB: 1940/09/19 81 y.o. PCP: Kristie Cowman, MD  Date of Admission: 06/12/2021 10:45 PM Date of Discharge: 06/20/2021 Attending Physician: [name], MD  Discharge Diagnosis: 1. Acute Bilateral Corona Radiata CVA 2. Atrial Fibrillation 3. Orthostatic Hypotension 4. Foot hematoma 5. Aortic stenosis s/p prosthetic valve replacement 6. Left atrial thrombus 7. Hypertension 8. CKD 9. Chronic Thrombocytopenia 10. CAD s/p CABG 11. PAD 12. HLD 13. Left Pleural Effusion   Discharge Medications: Allergies as of 06/20/2021   No Known Allergies      Medication List     STOP taking these medications    apixaban 2.5 MG Tabs tablet Commonly known as: ELIQUIS   diltiazem 120 MG tablet Commonly known as: CARDIZEM       TAKE these medications    acetaminophen 325 MG tablet Commonly known as: TYLENOL Take 650 mg by mouth every 6 (six) hours as needed for mild pain or headache.   dabigatran 75 MG Caps capsule Commonly known as: PRADAXA Take 1 capsule (75 mg total) by mouth every 12 (twelve) hours.   diltiazem 180 MG 24 hr capsule Commonly known as: CARDIZEM CD Take 1 capsule (180 mg total) by mouth daily.   Ensure Take 237 mLs by mouth 2 (two) times daily between meals.   losartan 25 MG tablet Commonly known as: COZAAR Take 1 tablet (25 mg total) by mouth daily. Start taking on: June 21, 2021   magnesium oxide 400 (240 Mg) MG tablet Commonly known as: MAG-OX Take 400 mg by mouth 2 (two) times daily.   metoprolol tartrate 100 MG tablet Commonly known as: LOPRESSOR Take 1 tablet (100 mg total) by mouth 2 (two) times daily.   polyethylene glycol 17 g packet Commonly known as: MIRALAX / GLYCOLAX Take 17 g by mouth daily.   traZODone 50 MG tablet Commonly known as: DESYREL Take 0.5 tablets (25 mg total) by mouth at bedtime.               Discharge Care Instructions  (From admission, onward)            Start     Ordered   06/19/21 0000  Discharge wound care:       Comments: Cover the hematoma on the dorsum of the left foot with Xeroform Kellie Simmering [phone]). Secure with kerlix. Change daily.   06/19/21 0654            Disposition and follow-up:   Ms.Mikaiya L Mancera was discharged from [facility] in Stable condition.  At the hospital follow up visit please address:  1. Acute Bilateral Corona Radiata CVA -Assess neurological deficit and if has had any further deficits -Assess medication compliance and tolerability with dabigatran -MOCA to assess baseline mental status  Atrial Fibrillation -Assess if rate controlled and if patient has had any symptoms  Orthostatic Hypotension -Assess fluid status and orthostatics -Assess for syncopal episodes  Foot hematoma -Assess if healing well, pain tolerable, and no signs of infection  Hypertension -Assess BP is relatively controlled given orthostatic hypotension -Titrate BP medication as needed  CKD -BMP to assess Cr and electrolytes  Chronic Thrombocytopenia -CBC to monitor  PAD -Assess lower extremity pulses and sensation  HLD -Assess if need statin  Left Pleural Effusion -Continue to monitor  2.  Labs / imaging needed at time of follow-up: CBC, BMP  3.  Pending labs/ test needing follow-up: none  Follow-up Appointments: Need to schedule PCP follow up  [facility]  Course by problem list: [name] is a 81 y.o. with pertinent PMH of atrial fibrillation on Eliquis, aortic stenosis s/p prosthetic valve replacement, left atrial appendage thrombus, CAD s/p CABG, carotid artery disease s/p CEA, PAD, hyperlipidemia, CLL, and chronic thrombocytopenia and a recent admission for a TIA admitted with a new acute CVA. Hospital course is detailed below:  Subacute Bilateral Small Corona Radiata CVAs Admitted after MRI showed bilateral small CVA acute in the corona radiata.  Neurology was consulted and suggested it was  likely secondary to small vessel disease or cardioembolic given bilaterality of CVA.  Patient has been on Eliquis for A-fib as well as a left atrial appendage noted on TEE during March 2023 admission.  Given recent stroke work-up performed in May, no further stroke work-up was done.  Due to patient's aphasia, patient was initially bridged with IV heparin until she was able to tolerate p.o.  Given patient's large stroke despite being on Eliquis, decision was made to transition her to Pradaxa with close monitoring of her kidney function.  PT/OT worked with patient and recommended SNF placement.  Patient's aphasia appears to improve throughout hospitalization and anticipate some improvement while at SNF.  Orthostatic Hypotension Hypertension Patient had significant orthostatic hypotension from sitting 132/66 to 80/41 on standing. Patient also had an episode of aphasia when she was started on hydralazine in order manage her hypertension. Patient's home anti-hypertensives were held in the setting of this and IVF was given in order to support patient's blood pressure. In addition, patient has had poor PO intake without encouragement so may require regular encouragement to eat food. Patient will be recommended to hold home metoprolol until seen by PCP and orthostatic hypotension is better in order to avoid further hypoperfusion events to her brain. Patient's blood pressure was labile throughout hospitalization requiring careful titration of BP medications. Patient was started on losartan with goal SBP <180.  Atrial Fibrillation Patient's atrial fibrillation began to go into RVR during this admission.  Patient was started on IV amiodarone because of allowing for permissive hypertension given patient's stroke.  This was slowly tapered off and patient was resumed on her home metoprolol and diltiazem. Given her orthostasic hypotension, patient's metoprolol was held but diltiazem was continued. Heart rates have been rate  controlled and normal sinus rhythmupon discharge.   Left foot hematoma Patient was noted to have a left foot hematoma on admission.  This has been stable throughout hospitalization it does not appear to be actively bleeding or infected.  Wound dressing was placed and dressed daily.   Chronic Small Left Pleural Effusion Patient has had small left pleural effusion for several years. Was previously noted to be transudate in thoracentesis. Appears to be chronic in nature and patient has not had any oxygen requirements throughout hospitalization.   Other chronic conditions were medically managed with home medications and formulary alternatives as necessary (hypertension, CKD, chronic thrombocytopenia, CLL, CAD, aortic stenosis)  Discharge Exam:   BP (!) 185/74 (BP Location: Left Arm)   Pulse 99   Temp (!) 97.5 F (36.4 C) (Oral)   Resp 16   Ht 5' (1.524 m)   Wt 41.4 kg   SpO2 100%   BMI 17.83 kg/m  Discharge exam:  Constitutional:      General: She is not in acute distress.    Appearance: She is not ill-appearing.     Comments: Pleasantly caucasian elderly female.  HENT:     Head: Normocephalic and atraumatic.  Eyes:  Extraocular Movements: Extraocular movements intact.     Pupils: Pupils are equal, round, and reactive to light.  Cardiovascular:     Rate and Rhythm: Normal rate and regular rhythm.     Pulses: Normal pulses.     Heart sounds: Normal heart sounds.  Pulmonary:     Effort: Pulmonary effort is normal.     Breath sounds: Normal breath sounds.  Abdominal:     General: Abdomen is flat. There is no distension.     Palpations: Abdomen is soft. There is no mass.  Skin:    General: Skin is warm and dry.     Comments: Left foot hematoma on dorsal side. No surrounding erythema noted. Feet are both warm and dry to touch  Neurological:     Mental Status: She is alert.     Comments: Alert to self and location but not time or context.  Strength: 5/5 in all  extremities Sensation: symmetrical and appropriate  Pertinent Labs, Studies, and Procedures:     Latest Ref Rng & Units 06/16/2021   12:28 AM 06/15/2021   12:14 AM 06/14/2021    5:49 AM  CBC  WBC 4.0 - 10.5 K/uL 5.2  6.5  5.7   Hemoglobin 12.0 - 15.0 g/dL 13.0  12.5  14.7   Hematocrit 36.0 - 46.0 % 37.4  37.8  44.6   Platelets 150 - 400 K/uL 113  114  119       Latest Ref Rng & Units 06/20/2021    2:33 AM 06/18/2021    6:04 AM 06/17/2021    4:11 AM  BMP  Glucose 70 - 99 mg/dL 110  98  93   BUN 8 - 23 mg/dL '17  16  21   '$ Creatinine 0.44 - 1.00 mg/dL 0.91  1.11  1.32   Sodium 135 - 145 mmol/L 136  136  137   Potassium 3.5 - 5.1 mmol/L 3.6  4.0  4.8   Chloride 98 - 111 mmol/L 101  103  103   CO2 22 - 32 mmol/L '24  24  23   '$ Calcium 8.9 - 10.3 mg/dL 9.8  9.7  9.9     Discharge Instructions: Discharge Instructions     Call MD for:  difficulty breathing, headache or visual disturbances   Complete by: As directed    Call MD for:  persistant dizziness or light-headedness   Complete by: As directed    Call MD for:  persistant nausea and vomiting   Complete by: As directed    Call MD for:  redness, tenderness, or signs of infection (pain, swelling, redness, odor or green/yellow discharge around incision site)   Complete by: As directed    Call MD for:  severe uncontrolled pain   Complete by: As directed    Discharge wound care:   Complete by: As directed    Cover the hematoma on the dorsum of the left foot with Xeroform Kellie Simmering #[id number]). Secure with kerlix. Change daily.   Increase activity slowly   Complete by: As directed        Signed: France Ravens, MD 06/20/2021, 11:43 AM   Pager: [phone]

## 2021-06-19 NOTE — Progress Notes (Signed)
   06/19/21 1649  Assess: MEWS Score  Temp 97.6 F (36.4 C)  BP (!) 203/68  MAP (mmHg) 106  Pulse Rate 78  Resp 16  SpO2 100 %  O2 Device Room Air  Assess: MEWS Score  MEWS Temp 0  MEWS Systolic 2  MEWS Pulse 0  MEWS RR 0  MEWS LOC 0  MEWS Score 2  MEWS Score Color Yellow  Assess: if the MEWS score is Yellow or Red  Were vital signs taken at a resting state? No  Focused Assessment No change from prior assessment  Does the patient meet 2 or more of the SIRS criteria? No  MEWS guidelines implemented *See Row Information* No, vital signs rechecked  Notify: Charge Nurse/RN  Name of Charge Nurse/RN Notified Janique RN  Date Charge Nurse/RN Notified 06/19/21  Time Charge Nurse/RN Notified 1649  Notify: Provider  Provider Name/Title Dr Lurline Hare  Date Provider Notified 06/19/21  Time Provider Notified 1649  Method of Notification Call  Notification Reason Other (Comment) (high b/p)  Provider response Other (Comment) (continue monitor b/p)  Date of Provider Response 06/19/21  Time of Provider Response 1649  Assess: SIRS CRITERIA  SIRS Temperature  0  SIRS Pulse 0  SIRS Respirations  0  SIRS WBC 0  SIRS Score Sum  0   Patient has no headache or c/o pain states she feels fine,  Janet Humphreys, Tivis Ringer, RN

## 2021-06-19 NOTE — Progress Notes (Signed)
Physical Therapy Treatment Patient Details Name: Nicole Mercado MRN: [id number] DOB: April 04, 1940 Today's Date: 06/19/2021   History of Present Illness 81 yo female presenting to ED on 6/8 with aphasia and AMS. MRI showing small areas of acute or early subacute ischemia within both corona radiata. PMH including afib, aortic stenosis s/p prosthetic valve, CAD s/p CABG, carotid artery disease s/p CEA, PAD, hyperlipidemia, CLL, chronic thrombocytopenia, and recent admission for TIA.    PT Comments    Patient is making progress towards meeting PT goals. She continues to require physical assistance with transfers and was able to perform stand step transfer to the chair with assistance. Limited standing tolerance and weakness limits progression of ambulation. Poor sitting and standing balance with facilitation required to maintain midline. She is not at her baseline level of functional mobility. SNF is recommended at discharge to maximize independence and facilitate return to prior level of function.     Recommendations for follow up therapy are one component of a multi-disciplinary discharge planning process, led by the attending physician.  Recommendations may be updated based on patient status, additional functional criteria and insurance authorization.  Follow Up Recommendations  Skilled nursing-short term rehab (<3 hours/day)     Assistance Recommended at Discharge Frequent or constant Supervision/Assistance  Patient can return home with the following A little help with walking and/or transfers;A little help with bathing/dressing/bathroom;Assistance with cooking/housework;Assist for transportation;Direct supervision/assist for medications management;Direct supervision/assist for financial management   Equipment Recommendations  None recommended by PT    Recommendations for Other Services       Precautions / Restrictions Precautions Precautions: Fall Restrictions Weight Bearing  Restrictions: No     Mobility  Bed Mobility               General bed mobility comments: not assessed. patient sitting up with OT on arrival to the room    Transfers Overall transfer level: Needs assistance Equipment used: 2 person hand held assist Transfers: Sit to/from Stand Sit to Stand: Min assist, Mod assist, +2 physical assistance   Step pivot transfers: Mod assist, +2 physical assistance       General transfer comment: verbal cues for hand placement with limited carry over of instruction. lifting assistance required for standing with decreased eccentric control noted. multiple standing bouts performed from bed. of note, patient had been incontient of bowel before standing. faciliation for RLE advancement with step transfer to chair.    Ambulation/Gait                   Stairs             Wheelchair Mobility    Modified Rankin (Stroke Patients Only)       Balance Overall balance assessment: Needs assistance Sitting-balance support: Bilateral upper extremity supported, No upper extremity supported, Feet supported Sitting balance-Leahy Scale: Poor Sitting balance - Comments: faciliation for upright and midline posture. patient required perods of Min A to maintain midline due to right and posterior lean. more pronounced with fatigue Postural control: Posterior lean, Right lateral lean Standing balance support: No upper extremity supported Standing balance-Leahy Scale: Poor Standing balance comment: patient relying on bed for posterior leg support in standing. faciliation for hip extension with cues for upright posture. standing tolerance of around 3 minutes                            Cognition Arousal/Alertness: Awake/alert Behavior During Therapy: [facility] for tasks  assessed/performed Overall Cognitive Status: Impaired/Different from baseline                           Safety/Judgement: Decreased awareness of safety, Decreased  awareness of deficits   Problem Solving: Requires verbal cues, Requires tactile cues, Difficulty sequencing, Slow processing General Comments: patient is oriented to self, place, situation. she is able to follow single step commands with increased time        Exercises      General Comments General comments (skin integrity, edema, etc.): standing vitals taken during session. standing 0 minutes 155/85 and standing 3 minutes 123/64. no dizziness is reported with standing      Pertinent Vitals/Pain Pain Assessment Pain Assessment: No/denies pain    Home Living                          Prior Function            PT Goals (current goals can now be found in the care plan section) Acute Rehab PT Goals Patient Stated Goal: none stated PT Goal Formulation: Patient unable to participate in goal setting Time For Goal Achievement: 06/28/21 Potential to Achieve Goals: Fair Progress towards PT goals: Progressing toward goals    Frequency    Min 3X/week      PT Plan Current plan remains appropriate    Co-evaluation PT/OT/SLP Co-Evaluation/Treatment: Yes Reason for Co-Treatment: To address functional/ADL transfers PT goals addressed during session: Mobility/safety with mobility OT goals addressed during session: ADL's and self-care      AM-PAC PT "6 Clicks" Mobility   Outcome Measure  Help needed turning from your back to your side while in a flat bed without using bedrails?: A Little Help needed moving from lying on your back to sitting on the side of a flat bed without using bedrails?: A Little Help needed moving to and from a bed to a chair (including a wheelchair)?: A Lot Help needed standing up from a chair using your arms (e.g., wheelchair or bedside chair)?: A Little Help needed to walk in hospital room?: A Lot Help needed climbing 3-5 steps with a railing? : A Lot 6 Click Score: 15    End of Session   Activity Tolerance: Patient tolerated treatment  well Patient left: in chair;with call bell/phone within reach;with chair alarm set   PT Visit Diagnosis: Other abnormalities of gait and mobility (R26.89);Muscle weakness (generalized) (M62.81);Other symptoms and signs involving the nervous system (N17.001)     Time: 7494-4967 PT Time Calculation (min) (ACUTE ONLY): 14 min  Charges:  $Therapeutic Activity: 8-22 mins                     Minna Merritts, PT, MPT    Percell Locus 06/19/2021, 10:04 AM

## 2021-06-19 NOTE — Plan of Care (Signed)
  Problem: Health Behavior/Discharge Planning: Goal: Ability to manage health-related needs will improve Outcome: Progressing   

## 2021-06-19 NOTE — Progress Notes (Addendum)
HD#6 Subjective:  Overnight Events: BP up to 185/65. NAEON. No reports of agitation.  Patient seen and assessed at bedside. Reports doing well. No acute concerns at this time.  Medically stable for SNF placement  Objective:  Vital signs in last 24 hours: Vitals:   06/18/21 2340 06/19/21 0126 06/19/21 0359 06/19/21 0907  BP: (!) 164/119 (!) 156/75 (!) 135/99 (!) 151/54  Pulse: 88 80 79 83  Resp: '16  16 16  '$ Temp: 98.2 F (36.8 C)  98.1 F (36.7 C) 97.9 F (36.6 C)  TempSrc: Oral  Oral Oral  SpO2: 97%  97% 96%  Weight:      Height:       Supplemental O2: Room Air SpO2: 96 %   Physical Exam:  Physical Exam Constitutional:      General: She is not in acute distress.    Appearance: She is not ill-appearing.     Comments: Pleasantly caucasian elderly female.  HENT:     Head: Normocephalic and atraumatic.  Eyes:     Extraocular Movements: Extraocular movements intact.     Pupils: Pupils are equal, round, and reactive to light.  Cardiovascular:     Rate and Rhythm: Normal rate and regular rhythm.     Pulses: Normal pulses.     Heart sounds: Normal heart sounds.  Pulmonary:     Effort: Pulmonary effort is normal.     Breath sounds: Normal breath sounds.  Abdominal:     General: Abdomen is flat. There is no distension.     Palpations: Abdomen is soft. There is no mass.  Skin:    General: Skin is warm and dry.     Comments: Left foot hematoma on dorsal side. No surrounding erythema noted. Feet are both warm and dry to touch  Neurological:     Mental Status: She is alert.     Comments: Alert to self and location but not time or context.  Strength: 5/5 in all extremities Sensation: symmetrical and appropriate      Filed Weights   06/12/21 2328  Weight: 41.4 kg     Intake/Output Summary (Last 24 hours) at 06/19/2021 1055 Last data filed at 06/19/2021 0353 Gross per 24 hour  Intake 60 ml  Output 1600 ml  Net -1540 ml    Net IO Since Admission: -2,559.11  mL [06/19/21 1055]  Pertinent Labs:    Latest Ref Rng & Units 06/16/2021   12:28 AM 06/15/2021   12:14 AM 06/14/2021    5:49 AM  CBC  WBC 4.0 - 10.5 K/uL 5.2  6.5  5.7   Hemoglobin 12.0 - 15.0 g/dL 13.0  12.5  14.7   Hematocrit 36.0 - 46.0 % 37.4  37.8  44.6   Platelets 150 - 400 K/uL 113  114  119        Latest Ref Rng & Units 06/18/2021    6:04 AM 06/17/2021    4:11 AM 06/16/2021   12:28 AM  CMP  Glucose 70 - 99 mg/dL 98  93  114   BUN 8 - 23 mg/dL '16  21  20   '$ Creatinine 0.44 - 1.00 mg/dL 1.11  1.32  1.27   Sodium 135 - 145 mmol/L 136  137  139   Potassium 3.5 - 5.1 mmol/L 4.0  4.8  3.6   Chloride 98 - 111 mmol/L 103  103  103   CO2 22 - 32 mmol/L '24  23  25   '$ Calcium 8.9 -  10.3 mg/dL 9.7  9.9  9.8     Imaging: No results found.  Assessment/Plan:   Principal Problem:   Acute ischemic stroke [facility]) Active Problems:   Protein-calorie malnutrition, severe   Patient Summary: Nicole Mercado is a 81 y.o. with pertinent PMH of atrial fibrillation on Eliquis, aortic stenosis s/p prosthetic valve replacement, left atrial appendage thrombus, CAD s/p CABG, carotid artery disease s/p CEA, PAD, hyperlipidemia, CLL, and chronic thrombocytopenia and a recent admission for a TIA admitted with a new acute CVA.  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 Will ensure adequate PO intake -Dysphagia 3 with whole meds per SLP recommendations -PT/OT (recommend SNF) -[facility] pending  -Pradaxa 75 mg bid -RD consult for nutrition needs  #Orthostatic Hypotension, improved Continues to have orthostatic hypotension likely due to poor PO intake. Decrease in SBP from seated to standing ~30 points but was not symptomatic. Likely due to poor PO hydration so giving IVF but may be autonomic dysfunction secondary to stroke. -Continue IVF bolus as needed to support blood pressure -Repeat orthostatics tomorrow AM  Hospital  Delirium, stable -Delirium Precautions -Continue Trazodone 25 mg -1:1 sitter if continues to be agitated -Seroquel prn if refractory agitation  Atrial Fibrillation, now in RVR Aortic stenosis s/p prosthetic valve replacement in 2016 Left atrial thrombus (noted on TEE in 03/2021) -Pradaxa for secondary stroke prevention -Continue home diltiazem 120 mg qd  HTN Home med: diltiazem 120 mg qd, metoprolol tartrate 100 mg bid -Goal SBP <180 -Orthostatics vitals today -Continue to monitor -Continue holding antihypertensives -Outpatient management for BP control -Continue home diltiazem 120 mg qd  Foot Hematoma Stable, not erythematous -Continue to monitor  Elevated Cr, resolved CKD Hypokalemia, resolved Back to baseline Cr -Avoid nephrotoxic agents -Daily BMP -Encourage PO intake.   Chronic thrombocytopenia, Stable - CBC qod  CAD s/p CABG in 2016; PAD; HLD; Aortic atherosclerosis Carotid artery disease s/p CEA in 2013 -Continue to monitor  Small Left Pleural Effusion Persistent since even as early as 2016 -Outpatient monitoring  Diet: Dysphagia 3 IVF: none VTE: Dabigatran Code: DNR/DNI PT/OT recs: SNF. TOC recs: pending   Dispo: Anticipated discharge to  SNF  in 1 days pending SNF placement  France Ravens, MD 06/19/2021, 10:55 AM Pager: [phone]  Please contact the on call pager after 5 pm and on weekends at [phone].

## 2021-06-20 ENCOUNTER — Other Ambulatory Visit (HOSPITAL_COMMUNITY): Payer: Self-pay

## 2021-06-20 LAB — BASIC METABOLIC PANEL
Anion gap: 11 (ref 5–15)
BUN: 17 mg/dL (ref 8–23)
CO2: 24 mmol/L (ref 22–32)
Calcium: 9.8 mg/dL (ref 8.9–10.3)
Chloride: 101 mmol/L (ref 98–111)
Creatinine, Ser: 0.91 mg/dL (ref 0.44–1.00)
GFR, Estimated: >60 mL/min (ref >60)
Glucose, Bld: 110 mg/dL — ABNORMAL HIGH (ref 70–99)
Potassium: 3.6 mmol/L (ref 3.5–5.1)
Sodium: 136 mmol/L (ref 135–145)

## 2021-06-20 MED ORDER — LOSARTAN POTASSIUM 25 MG PO TABS: 25.0000 mg | ORAL_TABLET | Freq: Every day | ORAL | Status: AC

## 2021-06-20 MED ORDER — HYDRALAZINE HCL 25 MG PO TABS
25.0000 mg | ORAL_TABLET | Freq: Once | ORAL | Status: AC
Start: 2021-06-20 — End: 2021-06-20
  Administered 2021-06-20: 25 mg via ORAL
  Filled 2021-06-20: qty 1

## 2021-06-20 NOTE — Plan of Care (Signed)
  Problem: Health Behavior/Discharge Planning: Goal: Ability to manage health-related needs will improve Outcome: Adequate for Discharge   Problem: Clinical Measurements: Goal: Ability to maintain clinical measurements within normal limits will improve Outcome: Adequate for Discharge Goal: Will remain free from infection Outcome: Adequate for Discharge Goal: Diagnostic test results will improve Outcome: Adequate for Discharge Goal: Respiratory complications will improve Outcome: Adequate for Discharge Goal: Cardiovascular complication will be avoided Outcome: Adequate for Discharge   Problem: Activity: Goal: Risk for activity intolerance will decrease Outcome: Adequate for Discharge   Problem: Nutrition: Goal: Adequate nutrition will be maintained Outcome: Adequate for Discharge   Problem: Coping: Goal: Level of anxiety will decrease Outcome: Adequate for Discharge   Problem: Elimination: Goal: Will not experience complications related to bowel motility Outcome: Adequate for Discharge Goal: Will not experience complications related to urinary retention Outcome: Adequate for Discharge   Problem: Pain Managment: Goal: General experience of comfort will improve Outcome: Adequate for Discharge

## 2021-06-20 NOTE — TOC Transition Note (Signed)
Transition of Care [facility]) - CM/SW Discharge Note   Patient Details  Name: Nicole Mercado MRN: [id number] Date of Birth: Apr 09, 1940  Transition of Care [facility]) CM/SW Contact:  Geralynn Ochs, LCSW Phone Number: 06/20/2021, 12:05 PM   Clinical Narrative:   Patient's insurance has been approved and [facility] has a bed available. CSW updated MD and sent discharge information to [facility]. Transport scheduled with [facility] for next available. CSW notified [name] via phone on discharge, he is in agreement.  Nurse to call report to [phone], Room 114.    Final next level of care: Skilled Nursing Facility Barriers to Discharge: Barriers Resolved   Patient Goals and CMS Choice Patient states their goals for this hospitalization and ongoing recovery are:: patient unable to participate in goal setting, not oriented CMS Medicare.gov Compare Post Acute Care list provided to:: Patient Represenative (must comment) Choice offered to / list presented to : Adult Children  Discharge Placement              Patient [facility] bed at: [facility] Patient to be transferred to facility by: [place] Name of family member notified: [name] Patient and family notified of of transfer: 06/20/21  Discharge Plan and Services     Post Acute Care Choice: [place]                               Social Determinants of Health (SDOH) Interventions     Readmission Risk Interventions     No data to display

## 2021-06-20 NOTE — Progress Notes (Signed)
Speech Language Pathology Treatment: Dysphagia  Patient Details Name: Nicole Mercado MRN: [id number] DOB: 25-Dec-1940 Today's Date: 06/20/2021 Time: 0712-1975 SLP Time Calculation (min) (ACUTE ONLY): 12 min  Assessment / Plan / Recommendation Clinical Impression  Reiteration of strategies recommended from MBS were implemented during session. She was not able to recall strategy unless given min prompt. Pt tucking minimally while using straw and therapist demonstrated technique to ensure her chin is close to her chest during swallow. Even with cues and practice she was able to demonstrate 40% of the time. SLP wrote cue on her white board as environmental cue. Oral mastication time and transit improved today. She is scheduled to go to SNF today. Therapist upgraded solid to regular, continue thin with chin tuck and ST at SNF.    HPI HPI: Pt is an 81 yo female presenting from [facility] with aphasia. MRI showed small areas of acute or early subacute ischemia within both corona radiata. Previous clinical swallow eval last 05/17/21 revealed intermittent coughing with thin liquids. MBS 05/20/21 revealed mild oral deficits and moderate vallecular residue that was effective at clearing residue. Trace, sensed aspiration occurred x1 before the swallow but did not happen again despite challenging. Dys 3 solids and thin liquids with chin tuck recommended, although there was concern given cognitive status that she might need help wtih training to use strategy. PMH includes: a fib, aortic stenosis s/p PVR, CAD s/p CABG, CAD s/p CEA, PAD, HLD, CLL, and chronic thrombocytopenia and a recent admission for a TIA. Pt had episode of decreased responsiveness due to hypotension and asked ST to re-assess.      SLP Plan  Continue with current plan of care      Recommendations for follow up therapy are one component of a multi-disciplinary discharge planning process, led by the attending physician.  Recommendations may be  updated based on patient status, additional functional criteria and insurance authorization.    Recommendations  Diet recommendations: Regular;Thin liquid Liquids provided via: Cup;Straw Medication Administration: Whole meds with puree Supervision: Patient able to self feed Compensations: Chin tuck;Small sips/bites;Slow rate (with liquids) Postural Changes and/or Swallow Maneuvers: Seated upright 90 degrees                Oral Care Recommendations: Oral care BID Follow Up Recommendations: Skilled nursing-short term rehab (<3 hours/day) Assistance recommended at discharge: Frequent or constant Supervision/Assistance SLP Visit Diagnosis: Dysphagia, unspecified (R13.10) Plan: Continue with current plan of care           [name]  06/20/2021, 11:53 AM

## 2021-06-20 NOTE — TOC Progression Note (Signed)
Transition of Care [facility]) - Progression Note    Patient Details  Name: Nicole Mercado MRN: [id number] Date of Birth: [birth year]  Transition of Care [facility]) CM/SW [place], [place] Phone Number: 06/20/2021, 10:04 AM  Clinical Narrative:   CSW met with patient's son, Randall Hiss, at bedside to answer questions about SNF placement and discuss concerns about patient's care needs. Randall Hiss agreeable to return to [facility]. CSW checked with [facility] and they will have a bed available for patient. CSW to initiate insurance authorization once therapy updates are entered. CSW to follow.    Expected Discharge Plan: [place] Barriers to Discharge: Continued Medical Work up, Ship broker, Requiring sitter/restraints  Expected Discharge Plan and Services Expected Discharge Plan: [place] Choice: [place] arrangements for the past 2 months: Assisted Living Facility                                       Social Determinants of Health (SDOH) Interventions    Readmission Risk Interventions     No data to display

## 2021-06-20 NOTE — TOC Progression Note (Signed)
Transition of Care [facility]) - Progression Note    Patient Details  Name: Nicole Mercado MRN: [id number] Date of Birth: 07/20/1940  Transition of Care [facility]) CM/SW [place], [place] Phone Number: 06/20/2021, 10:06 AM  Clinical Narrative:   CSW following for discharge to SNF. CSW requested insurance authorization this morning, delay in authorization as [facility] has patient's birthdate incorrect in their system. Insurance has been started but has not been approved at this time. CSW to follow.    Expected Discharge Plan: [place] Barriers to Discharge: Continued Medical Work up, Ship broker, Requiring sitter/restraints  Expected Discharge Plan and Services Expected Discharge Plan: [place] Choice: [place] arrangements for the past 2 months: Assisted Living Facility                                       Social Determinants of Health (SDOH) Interventions    Readmission Risk Interventions     No data to display

## 2021-06-20 NOTE — Progress Notes (Signed)
Patient discharged to [facility], transported by [facility]. Family aware of d/c plan attempted to call report was transferred 4x with no answer.  Arriah Wadle, Tivis Ringer, RN

## 2021-06-20 NOTE — Discharge Instructions (Addendum)
Dear Ms. Defilippo,  It was a pleasure to take care of you while you were in the hospital. You were admitted due to having bilateral strokes. You will be transferred to a SNF to get stronger. While you were in the hospital, your blood thinner was switched and we have adjusted some of your blood pressure medications. Please take your medication as prescribed and please follow up with your PCP to monitor you health problems.  Take care! -[place] [facility]   ============================================================================================ Information on my medicine - Pradaxa (dabigatran)  This medication education was reviewed with me or my healthcare representative as part of my discharge preparation.    Why was Pradaxa prescribed for you? Pradaxa was prescribed for you to reduce the risk of forming blood clots that cause a stroke if you have a medical condition called atrial fibrillation (a type of irregular heartbeat).    What do you Need to know about PradAXa? Take your Pradaxa TWICE DAILY - one capsule in the morning and one tablet in the evening with or without food.  It would be best to take the doses about the same time each day.  The capsules should not be broken, chewed or opened - they must be swallowed whole.  Do not store Pradaxa in other medication containers - once the bottle is opened the Pradaxa should be used within FOUR months; throw away any capsules that haven't been by that time.  Take Pradaxa exactly as prescribed by your doctor.  DO NOT stop taking Pradaxa without talking to the doctor who prescribed the medication.  Stopping without other stroke prevention medication to take the place of Pradaxa may increase your risk of developing a clot that causes a stroke.  Refill your prescription before you run out.  After discharge, you should have regular check-up appointments with your healthcare provider that is prescribing your Pradaxa.  In the future your dose  may need to be changed if your kidney function or weight changes by a significant amount.  What do you do if you miss a dose? If you miss a dose, take it as soon as you remember on the same day.  If your next dose is less than 6 hours away, skip the missed dose.  Do not take two doses of PRADAXA at the same time.  Important Safety Information A possible side effect of Pradaxa is bleeding. You should call your healthcare provider right away if you experience any of the following: Bleeding from an injury or your nose that does not stop. Unusual colored urine (red or dark brown) or unusual colored stools (red or black). Unusual bruising for unknown reasons. A serious fall or if you hit your head (even if there is no bleeding).  Some medicines may interact with Pradaxa and might increase your risk of bleeding or clotting while on Pradaxa. To help avoid this, consult your healthcare provider or pharmacist prior to using any new prescription or non-prescription medications, including herbals, vitamins, non-steroidal anti-inflammatory drugs (NSAIDs) and supplements.  This website has more information on Pradaxa (dabigatran): [email or web address]://www.pradaxa.com

## 2021-06-20 NOTE — TOC Benefit Eligibility Note (Signed)
Patient Advocate Encounter  Prior Authorization for Dabigatran Etexilate Mesylate '75MG'$  capsules has been approved.    PA# [id number] Effective dates: 06/14/2021 through 01/05/2022  Patients co-pay is $18.80.     Lyndel Safe, [place] Patient Advocate Specialist [place] Patient Advocate Team Direct Number: [phone]  Fax: [phone]

## 2021-09-16 ENCOUNTER — Encounter (HOSPITAL_COMMUNITY): Payer: Medicare Other

## 2021-10-15 ENCOUNTER — Ambulatory Visit (HOSPITAL_COMMUNITY): Admission: RE | Admit: 2021-10-15 | Payer: Medicare Other | Source: Ambulatory Visit

## 2021-11-23 ENCOUNTER — Emergency Department (HOSPITAL_COMMUNITY): Payer: Medicare Other

## 2021-11-23 ENCOUNTER — Other Ambulatory Visit: Payer: Self-pay

## 2021-11-23 ENCOUNTER — Encounter (HOSPITAL_COMMUNITY): Payer: Self-pay | Admitting: Emergency Medicine

## 2021-11-23 ENCOUNTER — Emergency Department (HOSPITAL_COMMUNITY)
Admission: EM | Admit: 2021-11-23 | Discharge: 2021-11-23 | Disposition: A | Discharge status: Home / Self Care | Payer: Medicare Other | Attending: Emergency Medicine | Admitting: Emergency Medicine

## 2021-11-23 DIAGNOSIS — W19XXXA Unspecified fall, initial encounter: Secondary | ICD-10-CM | POA: Insufficient documentation

## 2021-11-23 DIAGNOSIS — S0081XA Abrasion of other part of head, initial encounter: Secondary | ICD-10-CM | POA: Insufficient documentation

## 2021-11-23 DIAGNOSIS — S81812A Laceration without foreign body, left lower leg, initial encounter: Secondary | ICD-10-CM | POA: Insufficient documentation

## 2021-11-23 DIAGNOSIS — Z79899 Other long term (current) drug therapy: Secondary | ICD-10-CM | POA: Insufficient documentation

## 2021-11-23 DIAGNOSIS — S8992XA Unspecified injury of left lower leg, initial encounter: Secondary | ICD-10-CM | POA: Diagnosis present

## 2021-11-23 DIAGNOSIS — I2510 Atherosclerotic heart disease of native coronary artery without angina pectoris: Secondary | ICD-10-CM | POA: Insufficient documentation

## 2021-11-23 DIAGNOSIS — S0990XA Unspecified injury of head, initial encounter: Secondary | ICD-10-CM | POA: Diagnosis not present

## 2021-11-23 DIAGNOSIS — I10 Essential (primary) hypertension: Secondary | ICD-10-CM | POA: Insufficient documentation

## 2021-11-23 LAB — URINALYSIS, ROUTINE W REFLEX MICROSCOPIC
Bilirubin Urine: NEGATIVE
Glucose, UA: NEGATIVE mg/dL
Ketones, ur: NEGATIVE mg/dL
Nitrite: POSITIVE — AB
Protein, ur: 100 mg/dL — AB
Specific Gravity, Urine: 1.014 (ref 1.005–1.030)
WBC, UA: >50 WBC/hpf — ABNORMAL HIGH (ref 0–5)
pH: 6 (ref 5.0–8.0)

## 2021-11-23 LAB — CBC WITH DIFFERENTIAL/PLATELET
Abs Immature Granulocytes: 0.01 10*3/uL (ref 0.00–0.07)
Basophils Absolute: 0.1 10*3/uL (ref 0.0–0.1)
Basophils Relative: 1 %
Eosinophils Absolute: 0 10*3/uL (ref 0.0–0.5)
Eosinophils Relative: 0 %
HCT: 37.2 % (ref 36.0–46.0)
Hemoglobin: 12.4 g/dL (ref 12.0–15.0)
Immature Granulocytes: 0 %
Lymphocytes Relative: 54 %
Lymphs Abs: 4.2 10*3/uL — ABNORMAL HIGH (ref 0.7–4.0)
MCH: 30.9 pg (ref 26.0–34.0)
MCHC: 33.3 g/dL (ref 30.0–36.0)
MCV: 92.8 fL (ref 80.0–100.0)
Monocytes Absolute: 0.5 10*3/uL (ref 0.1–1.0)
Monocytes Relative: 6 %
Neutro Abs: 3 10*3/uL (ref 1.7–7.7)
Neutrophils Relative %: 39 %
Platelets: 92 10*3/uL — ABNORMAL LOW (ref 150–400)
RBC: 4.01 MIL/uL (ref 3.87–5.11)
RDW: 13.1 % (ref 11.5–15.5)
WBC: 7.8 10*3/uL (ref 4.0–10.5)
nRBC: 0 % (ref 0.0–0.2)

## 2021-11-23 LAB — BASIC METABOLIC PANEL
Anion gap: 7 (ref 5–15)
BUN: 29 mg/dL — ABNORMAL HIGH (ref 8–23)
CO2: 27 mmol/L (ref 22–32)
Calcium: 10.5 mg/dL — ABNORMAL HIGH (ref 8.9–10.3)
Chloride: 109 mmol/L (ref 98–111)
Creatinine, Ser: 1.03 mg/dL — ABNORMAL HIGH (ref 0.44–1.00)
GFR, Estimated: 55 mL/min — ABNORMAL LOW (ref >60)
Glucose, Bld: 100 mg/dL — ABNORMAL HIGH (ref 70–99)
Potassium: 4 mmol/L (ref 3.5–5.1)
Sodium: 143 mmol/L (ref 135–145)

## 2021-11-23 MED ORDER — METOPROLOL TARTRATE 5 MG/5ML IV SOLN
10.0000 mg | INTRAVENOUS | Status: AC | PRN
  Administered 2021-11-23 (×3): 10 mg via INTRAVENOUS
  Filled 2021-11-23 (×3): qty 10

## 2021-11-23 MED ORDER — METOPROLOL TARTRATE 5 MG/5ML IV SOLN
10.0000 mg | Freq: Once | INTRAVENOUS | Status: AC
  Administered 2021-11-23: 10 mg via INTRAVENOUS
  Filled 2021-11-23: qty 10

## 2021-11-23 MED ORDER — DILTIAZEM HCL ER COATED BEADS 180 MG PO CP24: 180.0000 mg | ORAL_CAPSULE | Freq: Every day | ORAL | Status: DC

## 2021-11-23 MED ORDER — METOPROLOL TARTRATE 25 MG PO TABS
100.0000 mg | ORAL_TABLET | Freq: Two times a day (BID) | ORAL | Status: DC
  Administered 2021-11-23: 100 mg via ORAL
  Filled 2021-11-23: qty 4

## 2021-11-23 MED ORDER — FOSFOMYCIN TROMETHAMINE 3 G PO PACK
3.0000 g | PACK | Freq: Once | ORAL | Status: AC
Start: 2021-11-23 — End: 2021-11-23
  Administered 2021-11-23: 3 g via ORAL
  Filled 2021-11-23: qty 3

## 2021-11-23 NOTE — ED Provider Notes (Signed)
[facility] DEPT Provider Note   CSN: [id number] Arrival date & time: 11/23/21  1105     History  Chief Complaint  Patient presents with   Nicole Mercado is a 81 y.o. female.  With a history of thrombocytopenia, hypertension, arthritis, CAD, A-fib, aortic stenosis presents to the ED for evaluation of fall.  Patient states fall occurred just prior to arrival.  She was sitting in her wheelchair and leaned forward to pick something up off the ground when she fell forward out of a chair.  States she did not hit her head.  Did not lose consciousness.  Patient is on blood thinner.  Denies pain.  States that she does not know why EMS was called and why she was brought to the ER.  States she was lying on the ground for less than 30 minutes until a nurse found her.  States that she is able to walk with a walker, typically does not.   Fall       Home Medications Prior to Admission medications   Medication Sig Start Date End Date Taking? Authorizing Provider  acetaminophen (TYLENOL) 325 MG tablet Take 650 mg by mouth every 6 (six) hours as needed for headache (pain).   Yes [provider]  dabigatran (PRADAXA) 75 MG CAPS capsule Take 1 capsule (75 mg total) by mouth every 12 (twelve) hours. 06/19/21  Yes France Ravens, MD  diltiazem (CARDIZEM LA) 180 MG 24 hr tablet Take 180 mg by mouth daily.   Yes [provider]  lidocaine 4 % Place 1 patch onto the skin daily as needed (pain). To lower back.   Yes [provider]  losartan (COZAAR) 25 MG tablet Take 1 tablet (25 mg total) by mouth daily. 06/21/21  Yes France Ravens, MD  magnesium oxide (MAG-OX) 400 (240 Mg) MG tablet Take 400 mg by mouth 2 (two) times daily.   Yes [provider]  Nutritional Supplements (ENSURE ORIGINAL) LIQD Take 240 mLs by mouth 2 (two) times daily.   Yes [provider]  polyethylene glycol (MIRALAX / GLYCOLAX) 17 g packet Take 17 g by mouth  daily. Patient taking differently: Take 17 g by mouth daily as needed (constipation). 05/21/21  Yes France Ravens, MD  traZODone (DESYREL) 50 MG tablet Take 0.5 tablets (25 mg total) by mouth at bedtime. 06/19/21  Yes France Ravens, MD  diltiazem (CARDIZEM CD) 180 MG 24 hr capsule Take 1 capsule (180 mg total) by mouth daily. Patient not taking: Reported on 11/23/2021 04/24/21   Leanor Kail, PA  metoprolol tartrate (LOPRESSOR) 100 MG tablet Take 1 tablet (100 mg total) by mouth 2 (two) times daily. Patient not taking: Reported on 11/23/2021 03/25/21   Jettie Booze, MD      Allergies    Patient has no known allergies.    Review of Systems   Review of Systems  All other systems reviewed and are negative.   Physical Exam Updated Vital Signs BP (!) 217/74   Pulse 70   Temp 98 F (36.7 C)   Resp 16   Ht 5' (1.524 m)   Wt 41 kg   SpO2 99%   BMI 17.65 kg/m  Physical Exam Vitals and nursing note reviewed.  Constitutional:      General: She is not in acute distress.    Appearance: Normal appearance. She is well-developed.     Comments: Cachectic  HENT:     Head: Normocephalic and  atraumatic.     Mouth/Throat:     Mouth: Mucous membranes are moist.     Pharynx: Oropharynx is clear.  Eyes:     Extraocular Movements: Extraocular movements intact.     Conjunctiva/sclera: Conjunctivae normal.     Pupils: Pupils are equal, round, and reactive to light.     Comments: No nystagmus  Cardiovascular:     Rate and Rhythm: Normal rate and regular rhythm.     Heart sounds: No murmur heard. Pulmonary:     Effort: Pulmonary effort is normal. No respiratory distress.     Breath sounds: Normal breath sounds.  Abdominal:     Palpations: Abdomen is soft.     Tenderness: There is no abdominal tenderness.  Musculoskeletal:        General: Signs of injury (Skin tear over left lower leg with bandage in place) present. No swelling, tenderness or deformity. Normal range of motion.      Cervical back: Neck supple.     Right lower leg: No edema.     Left lower leg: No edema.     Comments: No tenderness to palpation of C-spine, bilateral shoulders, bilateral hips, bilateral knees, chest wall, abdomen  Skin:    General: Skin is warm and dry.     Capillary Refill: Capillary refill takes less than 2 seconds.     Comments: Minor abrasion to left cheek.    Neurological:     General: No focal deficit present.     Mental Status: She is alert and oriented to person, place, and time.     Comments: Patient has difficulty following directions and occasionally gets confused.  Follow commands.  No pronator drift, facial asymmetry, slurred speech.  Sensation intact in all extremities.  Strength 5 out of 5 in all extremities.  Normal finger-to-nose.  Psychiatric:        Mood and Affect: Mood normal.     ED Results / Procedures / Treatments   Labs (all labs ordered are listed, but only abnormal results are displayed) Labs Reviewed  BASIC METABOLIC PANEL  CBC WITH DIFFERENTIAL/PLATELET  URINALYSIS, ROUTINE W REFLEX MICROSCOPIC    EKG None  Radiology No results found.  Procedures Procedures    Medications Ordered in ED Medications - No data to display  ED Course/ Medical Decision Making/ A&P Clinical Course as of 11/23/21 1759  Sat Nov 23, 2021  1516 Urinalysis, Routine w reflex microscopic Urine, Clean Catch(!) Obvious signs of UTI [AS]  8938 CT Head Wo Contrast I personally reviewed the image.  No acute abnormalities [AS]  1516 DG Chest [facility] I personally reviewed the image.  Normal study. [AS]  1604 Spoke with son [name].  He states that nursing home called him and advised that the patient's blood pressure was elevated in the nursing home which is the reason for patient to be transferred to the hospital [AS]  1607 Spoke with hosice nurse who states the patient was initially supposed to be taken off of her antihypertensives as these are not deemed necessary  medications, however the nursing home has still been intermittently giving them.  She stated that she will add the patient's antihypertensive onto her medication list that if her blood pressure is still high today and have the hospice physician review. Truman Hayward [phone] [AS]  5277 Updated son [name] on discharge status [AS]    Clinical Course User Index [AS] Roylene Reason, PA-C  Medical Decision Making Amount and/or Complexity of Data Reviewed Labs: ordered. Radiology: ordered.  This patient presents to the ED for concern of fall, this involves an extensive number of treatment options, and is a complaint that carries with it a high risk of complications and morbidity.  The differential diagnosis includes head bleed, musculoskeletal injuries, contusions   Co morbidities that complicate the patient evaluation   hospice, aortic stenosis, A-fib,, cytopenia, CLL, hypertension, arthritis, CAD   My initial workup includes basic labs, CT head, chest x-ray  Additional history obtained from: Nursing notes significant hypertension and has been intermittently taking her antihypertensives.  This visit. Family son provides a personal history EMS provides a portion of the history  I ordered, reviewed and interpreted labs which include: BMP, CBC, urinalysis.  BMP shows slight uremia.  Urinalysis shows nitrites, large leukocytes, white blood cells, and bacteria.  Will be treated with fosfomycin.   I ordered imaging studies including CT head, chest x-ray I independently visualized and interpreted imaging which showed no acute findings I agree with the radiologist interpretation  Cardiac Monitoring:  The patient was maintained on a cardiac monitor.  I personally viewed and interpreted the cardiac monitored which showed an underlying rhythm of: NSR  Afebrile, hypertensive but was reduced in the ED.  Patient is an 81 year old female presents the ED for evaluation of a  fall.  Patient did not have any complaints on my initial evaluation.  Physical exam including neurologic exam unremarkable.  Labs significant for obvious UTI.  This may be the cause of her fall.  This was treated with Fosfomycin in the ED.  Imaging including CT head and chest x-ray unremarkable.  Extensive conversations with family and hospice nurse for head.  See ED course for further details.  Patient suffers from significant hypertension and has been intermittently taking her antihypertensives.  Hospice nurse was made aware and will forward this to hospice physician.  Blood pressure was successfully reduced in the ED.  Patient will be discharged back to [facility] in stable condition.  Strongly encouraged primary care follow-up.  At this time there does not appear to be any evidence of an acute emergency medical condition and the patient appears stable for discharge with appropriate outpatient follow up. Diagnosis was discussed with patient who verbalizes understanding of care plan and is agreeable to discharge. I have discussed return precautions with patient and son who verbalizes understanding. Patient encouraged to follow-up with their PCP within 1 week. All questions answered.  Patient's case discussed with Dr. Vanita Panda who agrees with plan to discharge with follow-up.   Note: Portions of this report may have been transcribed using voice recognition software. Every effort was made to ensure accuracy; however, inadvertent computerized transcription errors may still be present.          Final Clinical Impression(s) / ED Diagnoses Final diagnoses:  None    Rx / DC Orders ED Discharge Orders     None         Nehemiah Massed 11/23/21 1811    Carmin Muskrat, MD 11/23/21 2241    Carmin Muskrat, MD 11/23/21 2241

## 2021-11-23 NOTE — ED Notes (Signed)
Attempted to call report to [facility] and left a voice mail for call back

## 2021-11-23 NOTE — ED Notes (Signed)
[facility] called for transport.  

## 2021-11-23 NOTE — ED Triage Notes (Addendum)
BIB EMS from assisted living, EMS reports unwitnessed fall this morning, sun and hospice RN wanted her to be evaluated. RN reports no deficits from this fall, previous stroke deficits noted. Per staff her mentation is at baseline. Abrasion to L cheek, no complaints of pain. Hx of a-fib. Pt reports slipping while trying to get into her wheelchair.   EMS  BP 180/90 P 90 O2 98% RA RR 16

## 2021-11-23 NOTE — Discharge Instructions (Addendum)
You have been seen today for your complaint of a fall and hypertension. Your lab work was reassuring and showed no abnormalities. Your imaging was reassuring and showed no abnormalities. Follow up with: Your hospice provider Please seek immediate medical care if you develop any of the following symptoms: Develop a severe headache or confusion. Have unusual weakness or numbness. Feel faint. Have severe pain in your chest or abdomen. Vomit repeatedly. Have trouble breathing. At this time there does not appear to be the presence of an emergent medical condition, however there is always the potential for conditions to change. Please read and follow the below instructions.  Do not take your medicine if  develop an itchy rash, swelling in your mouth or lips, or difficulty breathing; call 911 and seek immediate emergency medical attention if this occurs.  You may review your lab tests and imaging results in their entirety on your MyChart account.  Please discuss all results of fully with your primary care provider and other specialist at your follow-up visit.  Note: Portions of this text may have been transcribed using voice recognition software. Every effort was made to ensure accuracy; however, inadvertent computerized transcription errors may still be present.

## 2021-12-03 ENCOUNTER — Other Ambulatory Visit: Payer: Self-pay

## 2021-12-03 ENCOUNTER — Emergency Department (HOSPITAL_COMMUNITY): Payer: Medicare Other

## 2021-12-03 ENCOUNTER — Emergency Department (HOSPITAL_COMMUNITY)
Admission: EM | Admit: 2021-12-03 | Discharge: 2021-12-04 | Disposition: A | Discharge status: Skilled Nursing Facility | Payer: Medicare Other | Attending: Emergency Medicine | Admitting: Emergency Medicine

## 2021-12-03 DIAGNOSIS — R410 Disorientation, unspecified: Secondary | ICD-10-CM | POA: Diagnosis present

## 2021-12-03 DIAGNOSIS — Z79899 Other long term (current) drug therapy: Secondary | ICD-10-CM | POA: Diagnosis not present

## 2021-12-03 DIAGNOSIS — I10 Essential (primary) hypertension: Secondary | ICD-10-CM | POA: Diagnosis not present

## 2021-12-03 LAB — BASIC METABOLIC PANEL
Anion gap: 9 (ref 5–15)
BUN: 24 mg/dL — ABNORMAL HIGH (ref 8–23)
CO2: 27 mmol/L (ref 22–32)
Calcium: 9.9 mg/dL (ref 8.9–10.3)
Chloride: 106 mmol/L (ref 98–111)
Creatinine, Ser: 0.9 mg/dL (ref 0.44–1.00)
GFR, Estimated: >60 mL/min (ref >60)
Glucose, Bld: 106 mg/dL — ABNORMAL HIGH (ref 70–99)
Potassium: 4.1 mmol/L (ref 3.5–5.1)
Sodium: 142 mmol/L (ref 135–145)

## 2021-12-03 NOTE — ED Triage Notes (Signed)
Patient arrives from [facility] for hypertension. Per EMS, facility reports patient's blood pressure was 240s/150s earlier today. Denies taking any hypertension medications. Hx of stroke 2 weeks ago. Patient denies any headache or vision changes at this time. Patient is noted to have R sided deficits result of stroke 2 weeks ago. Patient currently on hospice care.

## 2021-12-03 NOTE — ED Provider Notes (Signed)
[facility] EMERGENCY DEPARTMENT Provider Note   CSN: [id number] Arrival date & time: 12/03/21  2046     History {Add pertinent medical, surgical, social history, OB history to HPI:1} Chief Complaint  Patient presents with   Hypertension    Nicole Mercado is a 81 y.o. female.  81 year old female with prior medical history as detailed below presents for evaluation.  Patient sent from [facility] for apparent elevated blood pressure.  Patient with history of hypertension.  Facility staff noted that blood pressure of the patient was in the 240s/150s.  Patient apparently was without complaint.  Patient is currently on losartan for blood pressure control.  Patient is known to [facility] hospice.  Her hospice nurse is Luellen Pucker [phone]).  Luellen Pucker contacted over the phone.  She is aware of the patient's issues with blood pressure control.  Patient is pleasant without complaint on initial evaluation.  She appears to be mildly confused as to the reasons for arrival in the ED.  Patient's son Nicole Mercado contacted by phone.  He was unaware that the patient had been sent to the ED for evaluation.  He is requesting that we evaluate the patient to make sure that there is no significant problem with an infection or recurrent stroke.  Patient with history of recent stroke diagnosed in June, 2023.  Patient's son also reports that the patient recently recovered from a UTI.  He is requesting that we check a urine to rule out possible infection.    The history is provided by the patient and medical records.       Home Medications Prior to Admission medications   Medication Sig Start Date End Date Taking? Authorizing Provider  acetaminophen (TYLENOL) 325 MG tablet Take 650 mg by mouth every 6 (six) hours as needed for headache (pain).    [provider]  dabigatran (PRADAXA) 75 MG CAPS capsule Take 1 capsule (75 mg total) by mouth every 12 (twelve) hours. 06/19/21    France Ravens, MD  diltiazem (CARDIZEM CD) 180 MG 24 hr capsule Take 1 capsule (180 mg total) by mouth daily. Patient not taking: Reported on 11/23/2021 04/24/21   Leanor Kail, PA  diltiazem (CARDIZEM LA) 180 MG 24 hr tablet Take 180 mg by mouth in the morning.    [provider]  lidocaine 4 % Place 1 patch onto the skin daily as needed (pain). To lower back.    [provider]  losartan (COZAAR) 25 MG tablet Take 1 tablet (25 mg total) by mouth daily. Patient taking differently: Take 25 mg by mouth in the morning. 06/21/21   France Ravens, MD  magnesium oxide (MAG-OX) 400 (240 Mg) MG tablet Take 400 mg by mouth 2 (two) times daily.    [provider]  metoprolol tartrate (LOPRESSOR) 100 MG tablet Take 1 tablet (100 mg total) by mouth 2 (two) times daily. Patient not taking: Reported on 11/23/2021 03/25/21   Jettie Booze, MD  Nutritional Supplements (ENSURE ORIGINAL) LIQD Take 240 mLs by mouth 2 (two) times daily.    [provider]  polyethylene glycol (MIRALAX / GLYCOLAX) 17 g packet Take 17 g by mouth daily. Patient taking differently: Take 17 g by mouth daily as needed (constipation). 05/21/21   France Ravens, MD  traZODone (DESYREL) 50 MG tablet Take 0.5 tablets (25 mg total) by mouth at bedtime. 06/19/21   France Ravens, MD      Allergies    Patient has no known allergies.  Review of Systems   Review of Systems  All other systems reviewed and are negative.   Physical Exam Updated Vital Signs BP (!) 224/72   Pulse 73   Temp 98.2 F (36.8 C) (Oral)   Resp 13   Ht 5' (1.524 m)   Wt 41 kg   SpO2 98%   BMI 17.65 kg/m  Physical Exam Vitals and nursing note reviewed.  Constitutional:      General: She is not in acute distress.    Appearance: Normal appearance. She is well-developed.  HENT:     Head: Normocephalic and atraumatic.  Eyes:     Conjunctiva/sclera: Conjunctivae normal.     Pupils: Pupils are equal, round, and reactive to  light.  Cardiovascular:     Rate and Rhythm: Normal rate and regular rhythm.     Heart sounds: Normal heart sounds.  Pulmonary:     Effort: Pulmonary effort is normal. No respiratory distress.     Breath sounds: Normal breath sounds.  Abdominal:     General: There is no distension.     Palpations: Abdomen is soft.     Tenderness: There is no abdominal tenderness.  Musculoskeletal:        General: No deformity. Normal range of motion.     Cervical back: Normal range of motion and neck supple.  Skin:    General: Skin is warm and dry.  Neurological:     General: No focal deficit present.     Mental Status: She is alert and oriented to person, place, and time.     ED Results / Procedures / Treatments   Labs (all labs ordered are listed, but only abnormal results are displayed) Labs Reviewed  CBC WITH DIFFERENTIAL/PLATELET  BASIC METABOLIC PANEL  URINALYSIS, ROUTINE W REFLEX MICROSCOPIC    EKG EKG Interpretation  Date/Time:  Tuesday December 03 2021 20:46:46 EST Ventricular Rate:  75 PR Interval:  194 QRS Duration: 87 QT Interval:  388 QTC Calculation: 434 R Axis:   48 Text Interpretation: Sinus rhythm Atrial premature complex Confirmed by Dene Gentry [phone]) on 12/03/2021 9:01:33 PM  Radiology No results found.  Procedures Procedures  {Document cardiac monitor, telemetry assessment procedure when appropriate:1}  Medications Ordered in ED Medications - No data to display  ED Course/ Medical Decision Making/ A&P                           Medical Decision Making Amount and/or Complexity of Data Reviewed Labs: ordered. Radiology: ordered.    Medical Screen Complete  This patient presented to the ED with complaint of hypertension.  This complaint involves an extensive number of treatment options. The initial differential diagnosis includes, but is not limited to, endorgan damage related to hypertension, metabolic abnormality, UTI, etc.  This presentation  is: Acute, Chronic, Self-Limited, Previously Undiagnosed, Uncertain Prognosis, Complicated, Systemic Symptoms, and Threat to Life/Bodily Function  Patient with noted significant hypertension earlier today at her facility.  Blood pressure is improved upon arrival to the ED.  Patient is without complaint.    Patient's family is requesting work-up including urine and labs to evaluate for possible endorgan damage related to hypertension and/or recurrent UTI. Son - Nicole Mercado - confirms DNR status.   Patient's nurse Luellen Pucker [phone]) who is with Hospice is aware of issues regarding the patient's blood pressure control.  She apparently saw the patient earlier today. She assures me that she will be able to follow-up with  the patient tomorrow and work with the patient's facility for better blood pressure control -pacifically regarding adjustment of the patient's hypertension medications.  Work-up and case discussed with oncoming EDP - [place].    Additional history obtained:  Additional history obtained from [facility] and Caregiver External records from outside sources obtained and reviewed including prior ED visits and prior Inpatient records.    Lab Tests:  I ordered and personally interpreted labs.  The pertinent results include:  CBC BMP UA   Imaging Studies ordered:  I ordered imaging studies including CT HEAD  I independently visualized and interpreted obtained imaging which showed *** I agree with the radiologist interpretation.   Cardiac Monitoring:  The patient was maintained on a cardiac monitor.  I personally viewed and interpreted the cardiac monitor which showed an underlying rhythm of: ***   Medicines ordered:  I ordered medication including ***  for ***  Reevaluation of the patient after these medicines showed that the patient: {resolved/improved/worsened:23923::"improved"}    Test Considered:  ***   Critical Interventions:  ***   Consultations Obtained:  I  consulted ***,  and discussed lab and imaging findings as well as pertinent plan of care.    Problem List / ED Course:  ***   Reevaluation:  After the interventions noted above, I reevaluated the patient and found that they have: {resolved/improved/worsened:23923::"improved"}   Social Determinants of Health:  ***   Disposition:  After consideration of the diagnostic results and the patients response to treatment, I feel that the patent would benefit from ***.    {Document critical care time when appropriate:1} {Document review of labs and clinical decision tools ie heart score, Chads2Vasc2 etc:1}  {Document your independent review of radiology images, and any outside records:1} {Document your discussion with family members, caretakers, and with consultants:1} {Document social determinants of health affecting pt's care:1} {Document your decision making why or why not admission, treatments were needed:1} Final Clinical Impression(s) / ED Diagnoses Final diagnoses:  None    Rx / DC Orders ED Discharge Orders     None

## 2021-12-03 NOTE — ED Notes (Signed)
Patient taken to CT.

## 2021-12-04 LAB — CBC WITH DIFFERENTIAL/PLATELET
Abs Immature Granulocytes: 0.01 10*3/uL (ref 0.00–0.07)
Basophils Absolute: 0 10*3/uL (ref 0.0–0.1)
Basophils Relative: 1 %
Eosinophils Absolute: 0.1 10*3/uL (ref 0.0–0.5)
Eosinophils Relative: 1 %
HCT: 36.2 % (ref 36.0–46.0)
Hemoglobin: 11.8 g/dL — ABNORMAL LOW (ref 12.0–15.0)
Immature Granulocytes: 0 %
Lymphocytes Relative: 57 %
Lymphs Abs: 4.3 10*3/uL — ABNORMAL HIGH (ref 0.7–4.0)
MCH: 31.1 pg (ref 26.0–34.0)
MCHC: 32.6 g/dL (ref 30.0–36.0)
MCV: 95.3 fL (ref 80.0–100.0)
Monocytes Absolute: 0.6 10*3/uL (ref 0.1–1.0)
Monocytes Relative: 8 %
Neutro Abs: 2.5 10*3/uL (ref 1.7–7.7)
Neutrophils Relative %: 33 %
Platelets: 88 10*3/uL — ABNORMAL LOW (ref 150–400)
RBC: 3.8 MIL/uL — ABNORMAL LOW (ref 3.87–5.11)
RDW: 13.2 % (ref 11.5–15.5)
WBC: 7.5 10*3/uL (ref 4.0–10.5)
nRBC: 0 % (ref 0.0–0.2)

## 2021-12-04 LAB — URINALYSIS, ROUTINE W REFLEX MICROSCOPIC
Bilirubin Urine: NEGATIVE
Glucose, UA: NEGATIVE mg/dL
Ketones, ur: NEGATIVE mg/dL
Nitrite: NEGATIVE
Protein, ur: NEGATIVE mg/dL
Specific Gravity, Urine: 1.011 (ref 1.005–1.030)
pH: 8 (ref 5.0–8.0)

## 2021-12-04 NOTE — ED Notes (Signed)
Pt son updated.

## 2021-12-04 NOTE — ED Provider Notes (Signed)
Patient signed out pending lab work and urinalysis.  Notably hypertensive.  Patient's son was unaware that she was sent here and she is being followed by palliative and hospice at her living facility.  Initially blood pressure 228/79.  Most recent blood pressure 188/79.  She is relatively asymptomatic.  Labs obtained and reviewed and largely reassuring.  Urinalysis has some bacteria but no white cells.  I did send a culture.  Plan was to have her primary MD adjust medications at her living facility.  This was agreed upon by her son and hospice nurse.  Physical Exam  BP (!) 188/79   Pulse 80   Temp 98.1 F (36.7 C) (Oral)   Resp 14   Ht 1.524 m (5')   Wt 41 kg   SpO2 99%   BMI 17.65 kg/m    Procedures  Procedures  ED Course / MDM    Medical Decision Making Amount and/or Complexity of Data Reviewed Labs: ordered. Radiology: ordered.   Problem List Items Addressed This Visit   None Visit Diagnoses     Primary hypertension    -  Primary             Merryl Hacker, MD 12/04/21 [phone]

## 2021-12-04 NOTE — ED Notes (Signed)
Report called to Venancio Poisson, Therapist, sports at [facility]

## 2021-12-04 NOTE — Discharge Instructions (Signed)
You were seen today for high blood pressures.  Follow-up with your primary doctor at your living facility for adjustments of medications.  Hospice nurse was advised of your visit as was her son.

## 2021-12-04 NOTE — ED Notes (Signed)
[facility] Called 

## 2021-12-06 LAB — URINE CULTURE: Culture: 70000 — AB

## 2021-12-07 ENCOUNTER — Telehealth (HOSPITAL_BASED_OUTPATIENT_CLINIC_OR_DEPARTMENT_OTHER): Payer: Self-pay | Admitting: *Deleted

## 2021-12-07 NOTE — Telephone Encounter (Signed)
Post ED Visit - Positive Culture Follow-up  Culture report reviewed by antimicrobial stewardship pharmacist: [facility] Team '[]'$  Elenor Quinones, Pharm.D. '[]'$  Heide Guile, Pharm.D., BCPS AQ-ID '[]'$  Parks Neptune, Pharm.D., BCPS '[]'$  Alycia Rossetti, Pharm.D., BCPS '[]'$  [place], Pharm.D., BCPS, AAHIVP '[]'$  Legrand Como, Pharm.D., BCPS, AAHIVP '[]'$  Salome Arnt, PharmD, BCPS '[]'$  Johnnette Gourd, PharmD, BCPS '[]'$  Hughes Better, PharmD, BCPS '[]'$  Leeroy Cha, PharmD '[]'$  Laqueta Linden, PharmD, BCPS '[x]'$  Jim Desanctis, PharmD  [facility] Team '[]'$  [name], PharmD '[]'$  Lindell Spar, PharmD '[]'$  Royetta Asal, PharmD '[]'$  Graylin Shiver, Rph '[]'$  Rema Fendt) Glennon Mac, PharmD '[]'$  Arlyn Dunning, PharmD '[]'$  Netta Cedars, PharmD '[]'$  Dia Sitter, PharmD '[]'$  Leone Haven, PharmD '[]'$  Gretta Arab, PharmD '[]'$  Theodis Shove, PharmD '[]'$  Peggyann Juba, PharmD '[]'$  Reuel Boom, PharmD   Positive urine culture No further treatment indicated per Lorin Roemhildt, PA-C  Rosie Fate 12/07/2021, 1:59 PM

## 2021-12-07 NOTE — Telephone Encounter (Signed)
Post ED Visit - Positive Culture Follow-up  Culture report reviewed by antimicrobial stewardship pharmacist: [place] Team '[]'$  Elenor Quinones, Pharm.D. '[]'$  Heide Guile, Pharm.D., BCPS AQ-ID '[]'$  Parks Neptune, Pharm.D., BCPS '[]'$  Alycia Rossetti, Pharm.D., BCPS '[]'$  [place], Pharm.D., BCPS, AAHIVP '[]'$  Legrand Como, Pharm.D., BCPS, AAHIVP '[]'$  Salome Arnt, PharmD, BCPS '[]'$  Johnnette Gourd, PharmD, BCPS '[]'$  Hughes Better, PharmD, BCPS '[]'$  Leeroy Cha, PharmD '[]'$  Laqueta Linden, PharmD, BCPS '[x]'$  Luisa Hart, PharmD  [place] Team '[]'$  [name], PharmD '[]'$  Lindell Spar, PharmD '[]'$  Royetta Asal, PharmD '[]'$  Graylin Shiver, Rph '[]'$  Rema Fendt) Glennon Mac, PharmD '[]'$  Arlyn Dunning, PharmD '[]'$  Netta Cedars, PharmD '[]'$  Dia Sitter, PharmD '[]'$  Leone Haven, PharmD '[]'$  Gretta Arab, PharmD '[]'$  Theodis Shove, PharmD '[]'$  Peggyann Juba, PharmD '[]'$  Reuel Boom, PharmD   Positive urine culture Nor patient follow-up is required at this time.called [facility] and left voicemail to return call so I could fax culture reports for further evaluation by primary MD if desired. Awaiting return call.   Rosie Fate 12/07/2021, 2:36 PM

## 2021-12-07 NOTE — Progress Notes (Signed)
ED Antimicrobial Stewardship Positive Culture Follow Up   Nicole Mercado is an 81 y.o. female who presented to [facility] on 12/03/2021 with a chief complaint of  Chief Complaint  Patient presents with   Hypertension    Recent Results (from the past 720 hour(s))  Urine Culture     Status: Abnormal   Collection Time: 12/04/21  3:00 AM   Specimen: Urine, Clean Catch  Result Value Ref Range Status   Specimen Description URINE, CLEAN CATCH  Final   Special Requests   Final    NONE Performed at [facility], [address] [place], [place] [address]    Culture 70,000 COLONIES/mL KLEBSIELLA OXYTOCA (A)  Final   Report Status 12/06/2021 FINAL  Final   Organism ID, Bacteria KLEBSIELLA OXYTOCA (A)  Final      Susceptibility   Klebsiella oxytoca - MIC*    AMPICILLIN >=32 RESISTANT Resistant     CEFAZOLIN 8 SENSITIVE Sensitive     CEFEPIME <=0.12 SENSITIVE Sensitive     CEFTRIAXONE <=0.25 SENSITIVE Sensitive     CIPROFLOXACIN <=0.25 SENSITIVE Sensitive     GENTAMICIN <=1 SENSITIVE Sensitive     IMIPENEM <=0.25 SENSITIVE Sensitive     NITROFURANTOIN 32 SENSITIVE Sensitive     TRIMETH/SULFA <=20 SENSITIVE Sensitive     AMPICILLIN/SULBACTAM 16 INTERMEDIATE Intermediate     PIP/TAZO <=4 SENSITIVE Sensitive     * 70,000 COLONIES/mL KLEBSIELLA OXYTOCA   Patient discharged originally without antimicrobial agent and treatment is NOT indicated due to contaminated urine specimen.  Resource nurse to contact [facility] and fax over results of urine culture. No treatment needed due to contaminated urine specimen with 6-10 squamous epithelial cells noted on UA. Allow primary MD to reassess pt with culture report and determine if re-collection of urine sample is indicated with current patient status.   New antibiotic prescription: [name]  ED Provider: Margaretmary Lombard, PA-C   [name] 12/07/2021, 12:28 PM Clinical Pharmacist Monday - Friday phone -  [phone] Saturday - Sunday  phone - [phone]

## 2021-12-09 NOTE — Telephone Encounter (Signed)
Post ED Visit - Positive Culture Follow-up  Culture report reviewed by antimicrobial stewardship pharmacist: [place] Team '[]'$  Elenor Quinones, Pharm.D. '[]'$  Heide Guile, Pharm.D., BCPS AQ-ID '[]'$  Parks Neptune, Pharm.D., BCPS '[]'$  Alycia Rossetti, Pharm.D., BCPS '[]'$  [place], Pharm.D., BCPS, AAHIVP '[]'$  Legrand Como, Pharm.D., BCPS, AAHIVP '[]'$  Salome Arnt, PharmD, BCPS '[]'$  Johnnette Gourd, PharmD, BCPS '[]'$  Hughes Better, PharmD, BCPS '[]'$  Leeroy Cha, PharmD '[]'$  Laqueta Linden, PharmD, BCPS '[x]'$  Jim Desanctis, PharmD  [place] Team '[]'$  [name], PharmD '[]'$  Lindell Spar, PharmD '[]'$  Royetta Asal, PharmD '[]'$  Graylin Shiver, Rph '[]'$  Rema Fendt) Glennon Mac, PharmD '[]'$  Arlyn Dunning, PharmD '[]'$  Netta Cedars, PharmD '[]'$  Dia Sitter, PharmD '[]'$  Leone Haven, PharmD '[]'$  Gretta Arab, PharmD '[]'$  Theodis Shove, PharmD '[]'$  Peggyann Juba, PharmD '[]'$  Reuel Boom, PharmD   Positive urine culture Noo further patient follow-up is required at this time.  Spoke with [facility] and faxed culture report for MD @ SNF to evaluate per Lorin Roemhildt, PA-C  Rosie Fate 12/09/2021, 9:52 AM

## 2022-03-06 IMAGING — CR DG TIBIA/FIBULA 2V*L*
2 series · 2 of 2 positions shown · non-contrast
Comparison: None Available.

CLINICAL DATA: History of fall and pain

EXAM:
LEFT TIBIA AND FIBULA - 2 VIEW

[tibia ap]
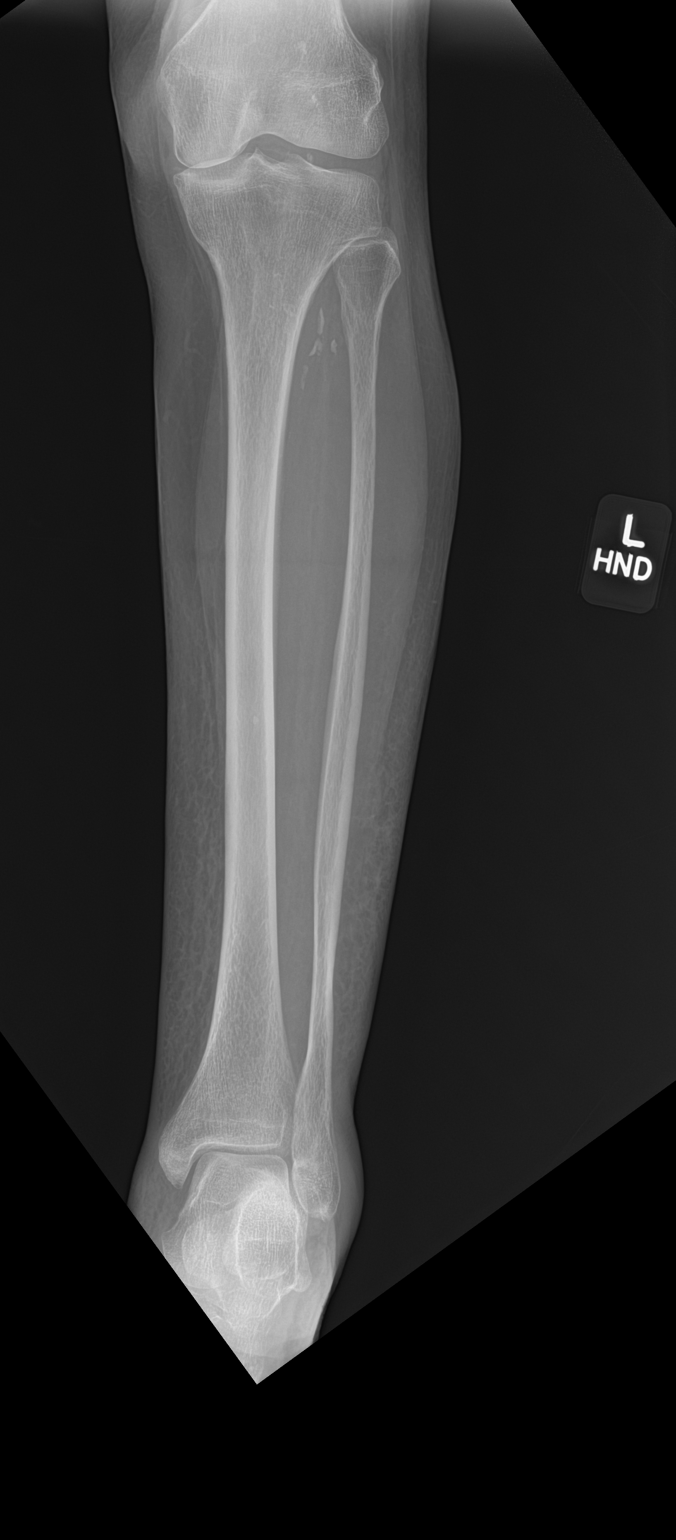

[tibia lat]
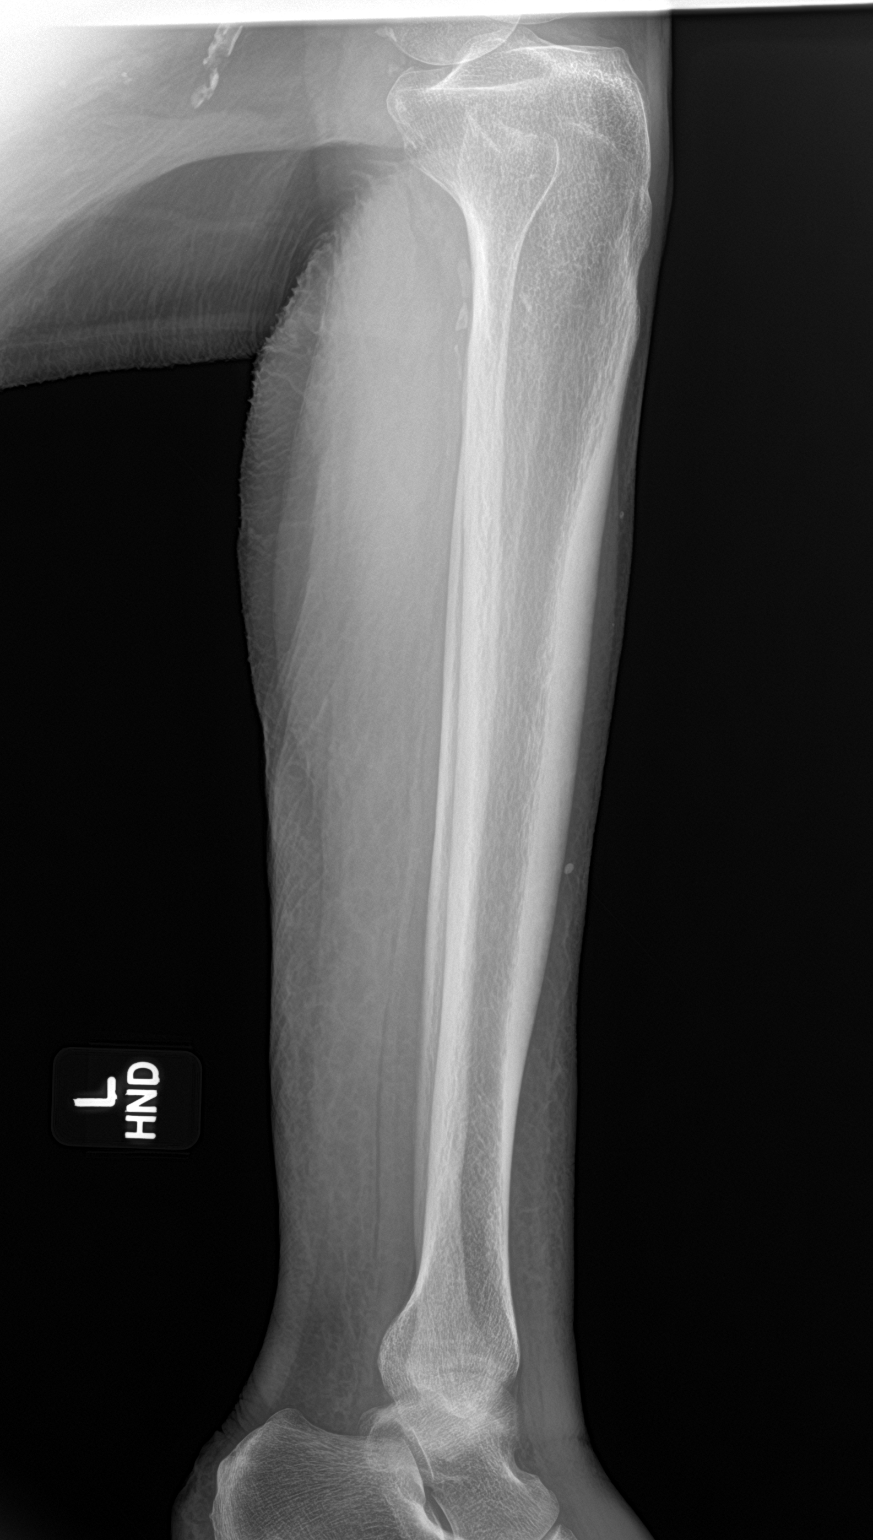

[2 of 2 positions shown; findings below may reference images not displayed]

FINDINGS: There is no evidence of fracture or other focal bone lesions. Mild
soft tissue swelling about the ankle. Vascular calcifications.
IMPRESSION: No fracture seen.

## 2022-07-31 ENCOUNTER — Emergency Department (HOSPITAL_COMMUNITY)

## 2022-07-31 ENCOUNTER — Encounter (HOSPITAL_COMMUNITY): Payer: Self-pay

## 2022-07-31 ENCOUNTER — Emergency Department (HOSPITAL_COMMUNITY)
Admission: EM | Admit: 2022-07-31 | Discharge: 2022-08-01 | Disposition: A | Discharge status: Skilled Nursing Facility | Attending: Emergency Medicine | Admitting: Emergency Medicine

## 2022-07-31 DIAGNOSIS — R001 Bradycardia, unspecified: Secondary | ICD-10-CM | POA: Diagnosis not present

## 2022-07-31 DIAGNOSIS — F0390 Unspecified dementia without behavioral disturbance: Secondary | ICD-10-CM | POA: Insufficient documentation

## 2022-07-31 DIAGNOSIS — R410 Disorientation, unspecified: Secondary | ICD-10-CM | POA: Insufficient documentation

## 2022-07-31 DIAGNOSIS — I2510 Atherosclerotic heart disease of native coronary artery without angina pectoris: Secondary | ICD-10-CM | POA: Diagnosis not present

## 2022-07-31 DIAGNOSIS — R55 Syncope and collapse: Secondary | ICD-10-CM

## 2022-07-31 DIAGNOSIS — Z7901 Long term (current) use of anticoagulants: Secondary | ICD-10-CM | POA: Diagnosis not present

## 2022-07-31 DIAGNOSIS — Z856 Personal history of leukemia: Secondary | ICD-10-CM | POA: Diagnosis not present

## 2022-07-31 DIAGNOSIS — I10 Essential (primary) hypertension: Secondary | ICD-10-CM | POA: Insufficient documentation

## 2022-07-31 DIAGNOSIS — Z951 Presence of aortocoronary bypass graft: Secondary | ICD-10-CM | POA: Insufficient documentation

## 2022-07-31 LAB — CBC WITH DIFFERENTIAL/PLATELET
Abs Immature Granulocytes: 0.02 10*3/uL (ref 0.00–0.07)
Basophils Absolute: 0 10*3/uL (ref 0.0–0.1)
Basophils Relative: 1 %
Eosinophils Absolute: 0 10*3/uL (ref 0.0–0.5)
Eosinophils Relative: 1 %
HCT: 38.8 % (ref 36.0–46.0)
Hemoglobin: 13.1 g/dL (ref 12.0–15.0)
Immature Granulocytes: 0 %
Lymphocytes Relative: 27 %
Lymphs Abs: 1.7 10*3/uL (ref 0.7–4.0)
MCH: 32.1 pg (ref 26.0–34.0)
MCHC: 33.8 g/dL (ref 30.0–36.0)
MCV: 95.1 fL (ref 80.0–100.0)
Monocytes Absolute: 0.7 10*3/uL (ref 0.1–1.0)
Monocytes Relative: 11 %
Neutro Abs: 3.8 10*3/uL (ref 1.7–7.7)
Neutrophils Relative %: 60 %
Platelets: 76 10*3/uL — ABNORMAL LOW (ref 150–400)
RBC: 4.08 MIL/uL (ref 3.87–5.11)
RDW: 11.9 % (ref 11.5–15.5)
WBC: 6.3 10*3/uL (ref 4.0–10.5)
nRBC: 0 % (ref 0.0–0.2)

## 2022-07-31 LAB — BASIC METABOLIC PANEL
Anion gap: 10 (ref 5–15)
BUN: 26 mg/dL — ABNORMAL HIGH (ref 8–23)
CO2: 25 mmol/L (ref 22–32)
Calcium: 9.9 mg/dL (ref 8.9–10.3)
Chloride: 103 mmol/L (ref 98–111)
Creatinine, Ser: 1.01 mg/dL — ABNORMAL HIGH (ref 0.44–1.00)
GFR, Estimated: 56 mL/min — ABNORMAL LOW (ref >60)
Glucose, Bld: 109 mg/dL — ABNORMAL HIGH (ref 70–99)
Potassium: 4 mmol/L (ref 3.5–5.1)
Sodium: 138 mmol/L (ref 135–145)

## 2022-07-31 LAB — TROPONIN I (HIGH SENSITIVITY)
Troponin I (High Sensitivity): 20 ng/L — ABNORMAL HIGH (ref <18)
Troponin I (High Sensitivity): 20 ng/L — ABNORMAL HIGH (ref <18)

## 2022-07-31 LAB — MAGNESIUM: Magnesium: 2.3 mg/dL (ref 1.7–2.4)

## 2022-07-31 MED ORDER — METOPROLOL TARTRATE 25 MG PO TABS
100.0000 mg | ORAL_TABLET | Freq: Once | ORAL | Status: AC
  Administered 2022-07-31: 100 mg via ORAL
  Filled 2022-07-31: qty 4

## 2022-07-31 MED ORDER — LACTATED RINGERS IV BOLUS
500.0000 mL | Freq: Once | INTRAVENOUS | Status: AC
  Administered 2022-07-31: 500 mL via INTRAVENOUS

## 2022-07-31 MED ORDER — METOPROLOL TARTRATE 25 MG PO TABS: 100.0000 mg | ORAL_TABLET | Freq: Once | ORAL | Status: DC

## 2022-07-31 MED ORDER — METOPROLOL TARTRATE 5 MG/5ML IV SOLN: 5.0000 mg | Freq: Once | INTRAVENOUS | Status: DC

## 2022-07-31 NOTE — ED Provider Notes (Addendum)
EMERGENCY DEPARTMENT AT [facility] Provider Note  MDM   HPI/ROS:  Nicole Mercado is a 82 y.o. female with a medical history significant for A-fib with RVR, prior CAD status post CABG, aortic stenosis status post aortic valve replacement, dementia, who presents for evaluation after a syncopal episode.  This was witnessed by staff at her skilled nursing facility who said that she was sitting at the table, did not have any prodromal symptoms, and passed out for approximately 2 minutes.  She was confused when she woke up however she has returned to her baseline.  There was no reported shaking and no urinary incontinence.  She did have fecal incontinence.  Obvious concern for high risk syncope.  Significant cardiac disease and an unreliable historian based on her dementia.  She is alert and oriented to person only.  Remainder of her physical exam is without significant abnormality.   High risk syncope especially in the setting of aortic stenosis, however this has been repaired.  No suspicion for any seizure based on story.  Given the fact that she is returned to baseline, low suspicion for any infectious etiology.  She is not significantly tachycardic and does not have a fever.  No other suspicion for cardiac or pulmonary etiology.  She is not tachycardic, does not have an oxygen requirement and is overall well-appearing.  She is neurovascularly intact and no focal deficit.   Will plan for basic labs, EKG, CT head, and chest x-ray.  Serial reevaluation.  Interpretations, interventions, and the patient's course of care are documented below.    Clinical Course as of 07/31/22 2339  Thu Jul 31, 2022  1956 CT Head Wo Contrast No evidence of ICH [BB]  1956 DG Chest Portable 1 View No evidence of pneumonia [BB]  2140 BP(!): 260/83 Patient hypertensive, will order home metoprolol [BB]  2158 Troponin I (High Sensitivity)(!): 20 Flat [BB]    Clinical Course User Index [BB] Fayrene Helper, MD    Patient was still hypertensive, looking back she has been hypertensive at multiple visits.  Suspect this is something that she needs to evaluate with her primary care doctor.  I did discuss the plan for discharge with the son via telephone.   Disposition:  I discussed the plan for discharge with the patient and/or their surrogate at bedside prior to discharge and they were in agreement with the plan and verbalized understanding of the return precautions provided. All questions answered to the best of my ability. Ultimately, the patient was discharged in stable condition with stable vital signs. I am reassured that they are capable of close follow up and good social support at home.   Clinical Impression:  1. Syncope and collapse     Rx / DC Orders ED Discharge Orders     None       The plan for this patient was discussed with Dr. Anitra Lauth, who voiced agreement and who oversaw evaluation and treatment of this patient.   Clinical Complexity A medically appropriate history, review of systems, and physical exam was performed.  My independent interpretations of EKG, labs, and radiology are documented in the ED course above.   If decision rules were used in this patient's evaluation, they are listed below.   Click here for ABCD2, HEART and other calculatorsREFRESH Note before signing   Patient's presentation is most consistent with acute presentation with potential threat to life or bodily function.  Medical Decision Making Amount and/or Complexity of Data Reviewed Independent  Historian: guardian and EMS    Details: Son via telephone, EMS External Data Reviewed: notes. Labs: ordered. Decision-making details documented in ED Course. Radiology: ordered. Decision-making details documented in ED Course. ECG/medicine tests: ordered and independent interpretation performed.  Risk OTC drugs. Prescription drug management. Parenteral controlled substances. Decision  regarding hospitalization.    HPI/ROS      See MDM section for pertinent HPI and ROS. A complete ROS was performed with pertinent positives/negatives noted above.   Past Medical History:  Diagnosis Date   Aortic stenosis    a. Echocardiogram (01/11/14):  Mild LVH, EF 60-65%, Gr 1 DD, possible bicuspid AV, severe AS (mean 61 mmHg, peak 104 mmHg), mild MVP of post leaflet, mild MR, PASP 33 mmHg.;  b. 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 LHC (01/13/14):  dLM 70 extending into oLAD, mLAD 40-50, pD1 80, pD2 70, ostial/prox OM1 70 >> CABG (L-LAD, S-OM1, S-D1, S-D2)  // Myoview 10/22: Fixed defect anterolateral wall consistent with artifact, EF 71, no ischemia, low risk   Carotid artery occlusion    a. s/p L CEA 2013;  b.  Carotid US (1/16):  Bilateral ICA 1-39% // Carotid US 10/22: Bilateral ICA 1-39   CLL (chronic lymphocytic leukemia) (HCC)    slow leukemia---Dr  Murinson   H/O exercise stress test    NSSTT   Hx of cardiovascular stress test    a. Nuclear stress test That (4/13): Normal perfusion, EF 74%   Hx of echocardiogram    Echo (2/16):  Mild LVH, EF 60-65%, no RWMA, Gr 2 DD, AVR ok (mean 9 mmHg), trivial AI, mild MR, mild LAE, PASP 40 mmHg   Hypertension    PAD (peripheral artery disease) (HCC)    LE Arterial US 10/22: R - pCFA, oSFA, dSFA 30-49; L - pCFA + mSFA 50-74, pPop 30-49 >> referred to [facility]   Pancreatitis    h/o   Thrombocytopenia (HCC) 11/19/2010    Past Surgical History:  Procedure Laterality Date   ABDOMINAL HYSTERECTOMY     AORTIC VALVE REPLACEMENT [name] 01/17/2014   Procedure: AORTIC VALVE REPLACEMENT (AVR);  Surgeon: Alleen Borne, MD;  Location: [facility] OR;  Service: Open Heart Surgery;  Laterality: [name];   APPENDECTOMY     CARDIOVERSION [name] 04/04/2021   Procedure: CARDIOVERSION;  Surgeon: Thomasene Ripple, DO;  Location: [facility] ENDOSCOPY;  Service: Cardiovascular;   Laterality: [name];   CARDIOVERSION [name] 05/08/2021   Procedure: CARDIOVERSION;  Surgeon: Thomasene Ripple, DO;  Location: [facility] ENDOSCOPY;  Service: Cardiovascular;  Laterality: [name];   CAROTID ENDARTERECTOMY  06/09/11   LEFT  cea   CESAREAN SECTION     FIVE   CHOLECYSTECTOMY     CORONARY ARTERY BYPASS GRAFT [name] 01/17/2014   Procedure: CORONARY ARTERY BYPASS GRAFTING (CABG)TIMES FOUR USING LEFT INTERNAL MAMMARY ARTERY AND RIGHT SAPHENOUS VEIN HARVESTED ENDOSCOPICALLY;  Surgeon: Alleen Borne, MD;  Location: [facility] OR;  Service: Open Heart Surgery;  Laterality: [name];   ENDARTERECTOMY  06/09/2011   Procedure: ENDARTERECTOMY CAROTID;  Surgeon: Larina Earthly, MD;  Location: [facility] OR;  Service: Vascular;  Laterality: Left;  left carotid endarterectomy with patch angioplasty   LEFT AND RIGHT HEART CATHETERIZATION WITH CORONARY ANGIOGRAM [name] 01/13/2014   Procedure: LEFT AND RIGHT HEART CATHETERIZATION WITH CORONARY ANGIOGRAM;  Surgeon: Corky Crafts, MD;  Location: [facility] CATH LAB;  Service: Cardiovascular;  Laterality: [name];   TEE WITHOUT CARDIOVERSION [name] 01/17/2014   Procedure: TRANSESOPHAGEAL ECHOCARDIOGRAM (TEE);  Surgeon: Alleen Borne, MD;  Location: [facility] OR;  Service: Open Heart Surgery;  Laterality: [name];   TEE WITHOUT CARDIOVERSION [name] 04/04/2021   Procedure: TRANSESOPHAGEAL ECHOCARDIOGRAM (TEE);  Surgeon: Thomasene Ripple, DO;  Location: [facility] ENDOSCOPY;  Service: Cardiovascular;  Laterality: [name];   TONSILLECTOMY        Physical Exam   Vitals:   07/31/22 1832  Weight: 40.8 kg  Height: 5' (1.524 m)    Physical Exam Vitals and nursing note reviewed.  Constitutional:      General: She is not in acute distress.    Appearance: She is well-developed.  HENT:     Head: Normocephalic and atraumatic.  Eyes:     Conjunctiva/sclera: Conjunctivae normal.  Cardiovascular:     Rate and Rhythm: Normal rate and regular rhythm.     Heart sounds: No murmur heard. Pulmonary:     Effort: Pulmonary effort is normal. No  respiratory distress.     Breath sounds: Normal breath sounds.  Abdominal:     Palpations: Abdomen is soft.     Tenderness: There is no abdominal tenderness.  Musculoskeletal:        General: No swelling.     Cervical back: Neck supple.  Skin:    General: Skin is warm and dry.     Capillary Refill: Capillary refill takes less than 2 seconds.  Neurological:     General: No focal deficit present.     Mental Status: She is alert. Mental status is at baseline. She is confused.     GCS: GCS eye subscore is 4. GCS verbal subscore is 5. GCS motor subscore is 6.     Cranial Nerves: Cranial nerves 2-12 are intact.     Sensory: Sensation is intact.     Motor: Motor function is intact. No weakness.     Coordination: Coordination is intact.     Comments: Reportedly at baseline, oriented to person  Psychiatric:        Mood and Affect: Mood normal.      Procedures   If procedures were preformed on this patient, they are listed below:  Procedures   Fayrene Helper, MD Emergency Medicine PGY-2   Please note that this documentation was produced with the assistance of voice-to-text technology and may contain errors.    Fayrene Helper, MD 07/31/22 Jules Schick    Fayrene Helper, MD 07/31/22 [id number]    Gwyneth Sprout, MD 07/31/22 2356

## 2022-07-31 NOTE — Discharge Instructions (Signed)
You were seen today for syncope. While you were here we monitored your vitals, preformed a physical exam, and got a CT of your head and labs. These were all reassuring and there is no indication for any further testing or intervention in the emergency department at this time.   Things to do:  - Follow up with your primary care provider within the next 1-2 weeks - Continue taking all your normal medications  Return to the emergency department if you have any new or worsening symptoms including passing out again, new chest pain, dizziness, weakness, seizures, or if you have any other concerns.

## 2022-07-31 NOTE — ED Triage Notes (Signed)
Pt was sitting at dining table and had a syncopal episode during dinner unresponsive for 2 min. Pt was not was talkative as usual for some time after. Pt is confused at baseline but facility reports increased confusion. Pt  has a history of gait issues.   Ems vitals Cbg- 151 Bp- 154/62 Hr70 nsr 99% on ra  Hospice pt. Son contacted son wanted transfer.

## 2022-08-01 ENCOUNTER — Emergency Department (HOSPITAL_COMMUNITY)

## 2022-08-01 ENCOUNTER — Other Ambulatory Visit: Payer: Self-pay

## 2022-08-01 ENCOUNTER — Emergency Department (HOSPITAL_COMMUNITY)
Admission: EM | Admit: 2022-08-01 | Discharge: 2022-08-01 | Disposition: A | Discharge status: Skilled Nursing Facility | Source: Home / Self Care | Attending: Emergency Medicine | Admitting: Emergency Medicine

## 2022-08-01 DIAGNOSIS — F0390 Unspecified dementia without behavioral disturbance: Secondary | ICD-10-CM | POA: Insufficient documentation

## 2022-08-01 DIAGNOSIS — R001 Bradycardia, unspecified: Secondary | ICD-10-CM | POA: Insufficient documentation

## 2022-08-01 DIAGNOSIS — R55 Syncope and collapse: Secondary | ICD-10-CM | POA: Insufficient documentation

## 2022-08-01 DIAGNOSIS — R410 Disorientation, unspecified: Secondary | ICD-10-CM | POA: Insufficient documentation

## 2022-08-01 LAB — CBC WITH DIFFERENTIAL/PLATELET
Abs Immature Granulocytes: 0.02 10*3/uL (ref 0.00–0.07)
Basophils Absolute: 0.1 10*3/uL (ref 0.0–0.1)
Basophils Relative: 1 %
Eosinophils Absolute: 0 10*3/uL (ref 0.0–0.5)
Eosinophils Relative: 0 %
HCT: 38.1 % (ref 36.0–46.0)
Hemoglobin: 12.4 g/dL (ref 12.0–15.0)
Immature Granulocytes: 0 %
Lymphocytes Relative: 45 %
Lymphs Abs: 3.5 10*3/uL (ref 0.7–4.0)
MCH: 32.8 pg (ref 26.0–34.0)
MCHC: 32.5 g/dL (ref 30.0–36.0)
MCV: 100.8 fL — ABNORMAL HIGH (ref 80.0–100.0)
Monocytes Absolute: 0.7 10*3/uL (ref 0.1–1.0)
Monocytes Relative: 9 %
Neutro Abs: 3.5 10*3/uL (ref 1.7–7.7)
Neutrophils Relative %: 45 %
Platelets: 80 10*3/uL — ABNORMAL LOW (ref 150–400)
RBC: 3.78 MIL/uL — ABNORMAL LOW (ref 3.87–5.11)
RDW: 12.1 % (ref 11.5–15.5)
WBC: 7.7 10*3/uL (ref 4.0–10.5)
nRBC: 0 % (ref 0.0–0.2)

## 2022-08-01 LAB — COMPREHENSIVE METABOLIC PANEL
ALT: 19 U/L (ref 0–44)
AST: 22 U/L (ref 15–41)
Albumin: 3.5 g/dL (ref 3.5–5.0)
Alkaline Phosphatase: 67 U/L (ref 38–126)
Anion gap: 9 (ref 5–15)
BUN: 25 mg/dL — ABNORMAL HIGH (ref 8–23)
CO2: 26 mmol/L (ref 22–32)
Calcium: 9.4 mg/dL (ref 8.9–10.3)
Chloride: 107 mmol/L (ref 98–111)
Creatinine, Ser: 1.21 mg/dL — ABNORMAL HIGH (ref 0.44–1.00)
GFR, Estimated: 45 mL/min — ABNORMAL LOW (ref >60)
Glucose, Bld: 131 mg/dL — ABNORMAL HIGH (ref 70–99)
Potassium: 4.3 mmol/L (ref 3.5–5.1)
Sodium: 142 mmol/L (ref 135–145)
Total Bilirubin: 0.6 mg/dL (ref 0.3–1.2)
Total Protein: 5 g/dL — ABNORMAL LOW (ref 6.5–8.1)

## 2022-08-01 LAB — TROPONIN I (HIGH SENSITIVITY)
Troponin I (High Sensitivity): 19 ng/L — ABNORMAL HIGH (ref <18)
Troponin I (High Sensitivity): 19 ng/L — ABNORMAL HIGH (ref <18)

## 2022-08-01 NOTE — ED Notes (Signed)
Got patient into a gown on thre monitor did EKG shown to Dr Jacqulyn Bath patient is resting with call bell in reach

## 2022-08-01 NOTE — ED Provider Notes (Signed)
[facility] EMERGENCY DEPARTMENT AT [facility] Provider Note   CSN: [id number] Arrival date & time: 08/01/22  1416     History Chief Complaint  Patient presents with   Bradycardia    HPI Nicole Mercado is a 82 y.o. female presenting for chief complaint of syncopal event.  Patient has a history dementia as well as complex history cannot provide much history. Consulted to patient's family for more care and management.  They state that she was found down near the toilet at her facility.  This is a second time in the past 24 hours.  They said the patient is on hospice for severe stroke worsening dementia.  They state her baseline is cognitively impaired but usually able to recollect her name. Notably, they comment that they called to the facility today and requested that the patient not be transferred to the emergency department any longer as she is undergoing hospice care and really should be comfort scope with therapy per their request.  Patient's recorded medical, surgical, social, medication list and allergies were reviewed in the Snapshot window as part of the initial history.   Review of Systems   Review of Systems  Unable to perform ROS: Dementia    Physical Exam Updated Vital Signs BP (!) 156/62   Pulse (!) 111   Temp (!) 97.5 F (36.4 C) (Oral)   Resp 13   SpO2 (!) 84%  Physical Exam Vitals and nursing note reviewed.  Constitutional:      General: She is not in acute distress.    Appearance: She is well-developed.  HENT:     Head: Normocephalic and atraumatic.  Eyes:     Conjunctiva/sclera: Conjunctivae normal.  Cardiovascular:     Rate and Rhythm: Normal rate and regular rhythm.     Heart sounds: No murmur heard. Pulmonary:     Effort: Pulmonary effort is normal. No respiratory distress.     Breath sounds: Normal breath sounds.  Abdominal:     General: There is no distension.     Palpations: Abdomen is soft.     Tenderness: There is no abdominal  tenderness. There is no right CVA tenderness or left CVA tenderness.  Musculoskeletal:        General: No swelling or tenderness. Normal range of motion.     Cervical back: Neck supple.  Skin:    General: Skin is warm and dry.  Neurological:     Mental Status: She is alert. She is disoriented.     Cranial Nerves: No cranial nerve deficit.     Sensory: No sensory deficit.      ED Course/ Medical Decision Making/ A&P    Procedures Procedures   Medications Ordered in ED Medications - No data to display Medical Decision Making:   Nicole Mercado is a 82 y.o. female who presented to the ED today with altered mental status detailed above.    Handoff received from EMS.  Additional history discussed with patient's family/caregivers.  Patient placed on continuous vitals and telemetry monitoring while in ED which was reviewed periodically.  Complete initial physical exam performed, notably the patient  was hemodynamically stable in no acute distress..    Reviewed and confirmed nursing documentation for past medical history, family history, social history.    Initial Assessment:   With the patient's presentation of altered mental status, most likely diagnosis is delerium 2/2 infectious etiology (UTI/CAP/URI) vs metabolic abnormality (Na/K/Mg/Ca) vs nonspecific etiology. Other diagnoses were considered including (but not  limited to) CVA, ICH, intracranial mass, critical dehydration, heptatic dysfunction, uremia, hypercarbia, intoxication, endrocrine abnormality, toxidrome. These are considered less likely due to history of present illness and physical exam findings.   This is most consistent with an acute life/limb threatening illness complicated by underlying chronic conditions.  Initial Plan:  CTH to evaluate for intracranial etiology of patient's symptoms  Screening labs including CBC and Metabolic panel to evaluate for infectious or metabolic etiology of disease.  CXR to evaluate for  structural/infectious intrathoracic pathology.  Troponin/EKG to evaluate for cardiac pathology Objective evaluation as below reviewed   Initial Study Results:   Laboratory  All laboratory results reviewed without evidence of clinically relevant pathology.    EKG EKG was reviewed independently. Rate, rhythm, axis, intervals all examined and without medically relevant abnormality. ST segments without concerns for elevations.    Radiology:  All images reviewed independently. Agree with radiology report at this time.   CT HEAD WO CONTRAST ( )  Result Date: 08/01/2022 CLINICAL DATA:  Trauma, syncope EXAM: CT HEAD WITHOUT CONTRAST TECHNIQUE: Contiguous axial images were obtained from the base of the skull through the vertex without intravenous contrast. RADIATION DOSE REDUCTION: This exam was performed according to the departmental dose-optimization program which includes automated exposure control, adjustment of the mA and/or kV according to patient size and/or use of iterative reconstruction technique. COMPARISON:  07/31/2022 FINDINGS: Brain: No acute intracranial findings are seen. There are no signs of bleeding within the cranium. There is decreased density in periventricular and subcortical white matter. Cortical sulci are prominent. Vascular: Unremarkable. Skull: No acute findings are seen. Sinuses/Orbits: Unremarkable. Other: No significant interval changes are noted. IMPRESSION: No acute intracranial findings are seen. Atrophy. Small-vessel disease. Electronically Signed   By: Ernie Avena M.D.   On: 08/01/2022 17:49   DG Chest Portable 1 View  Result Date: 08/01/2022 CLINICAL DATA:  Shortness of breath EXAM: PORTABLE CHEST 1 VIEW COMPARISON:  Chest x-ray dated July 25th 2024 FINDINGS: Unchanged cardiac and mediastinal contours status post median sternotomy and aortic valve replacement. Lungs are clear. No evidence of pleural effusion or pneumothorax. IMPRESSION: No acute  cardiopulmonary abnormality. Electronically Signed   By: Allegra Lai M.D.   On: 08/01/2022 15:49   DG Chest Portable 1 View  Result Date: 07/31/2022 CLINICAL DATA:  Syncope EXAM: PORTABLE CHEST 1 VIEW COMPARISON:  Chest x-ray dated November 23, 2021 FINDINGS: Cardiac and mediastinal contours are unchanged post median sternotomy and aortic valve replacement. Lungs are clear. No evidence of pleural effusion or pneumothorax. IMPRESSION: No active disease. Electronically Signed   By: Allegra Lai M.D.   On: 07/31/2022 19:29   CT Head Wo Contrast  Result Date: 07/31/2022 CLINICAL DATA:  Mental status change EXAM: CT HEAD WITHOUT CONTRAST TECHNIQUE: Contiguous axial images were obtained from the base of the skull through the vertex without intravenous contrast. RADIATION DOSE REDUCTION: This exam was performed according to the departmental dose-optimization program which includes automated exposure control, adjustment of the mA and/or kV according to patient size and/or use of iterative reconstruction technique. COMPARISON:  Head CT 12/03/2021 FINDINGS: Brain: No evidence of acute infarction, hemorrhage, hydrocephalus, extra-axial collection or mass lesion/mass effect. There is moderate diffuse atrophy. There is moderate periventricular and deep white matter hypodensity, unchanged. Vascular: Atherosclerotic calcifications are present within the cavernous internal carotid arteries. Skull: Normal. Negative for fracture or focal lesion. Sinuses/Orbits: No acute finding. Other: None. IMPRESSION: 1. No acute intracranial process. 2. 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 Electronically Signed  By: Darliss Cheney M.D.   On: 07/31/2022 19:21     Final Assessment and Plan:   Patient history of present illness and physical exam findings are most consistent with nonspecific altered mental status.  Had a long conversation with patient's family via phone call.  They stated that they would  like her to be kept as hospice scope with therapy with comfort care as the primary scope.  We discussed potential admission for syncope, cardiac versus neurogenic evaluation and family has declined this intervention.  They state that she would not be an interventional candidate for any surgical or procedural intervention and I am in agreement based on her presentation. Given otherwise reassuring objective findings today, clear understanding of patient's scope of therapy and no identification of emergent pathology on screening evaluation today, patient and family feel comfortable with discharge back to facility.  Patient in no acute distress on my serial reassessment stable for transfer to facility. Disposition:  I have considered need for hospitalization, however, considering all of the above, I believe this patient is stable for discharge at this time.  Patient/family educated about specific return precautions for given chief complaint and symptoms.  Patient/family educated about follow-up with PCP and hospice agency.     Patient/family expressed understanding of return precautions and need for follow-up. Patient spoken to regarding all imaging and laboratory results and appropriate follow up for these results. All education provided in verbal form with additional information in written form. Time was allowed for answering of patient questions. Patient discharged.    Emergency Department Medication Summary:   Medications - No data to display         Clinical Impression:  1. Bradycardia   2. Syncope, unspecified syncope type      Discharge   Final Clinical Impression(s) / ED Diagnoses Final diagnoses:  Bradycardia  Syncope, unspecified syncope type    Rx / DC Orders ED Discharge Orders     None         Glyn Ade, MD 08/01/22 1857

## 2022-08-01 NOTE — ED Notes (Signed)
The pt is very cinfused but pleasant she has pulled off her pulse ox and her id bracelet  both replaced

## 2022-08-01 NOTE — ED Triage Notes (Signed)
Pt bib [facility] from [facility]. Pt was here last night for a TIA and was d/c late last night/early this am. Today pt had passed out at the facility and was found by a nurse. EMS was called at the pt was found to have Lt side gaze, weakness, and facial droop. They symptoms resolved with EMS enroute to the hospital. LKW was 1300 per facility. Pt was also found to be brady in thr 40s HR. Pt is at baseline mental status. She has hx of dementia, and is on hospice for a previous CVA.   EMS vitals 150 CBG 44 HR 190/50 BP

## 2022-08-01 NOTE — ED Notes (Signed)
I attempted to call [facility]  to give a report on the pt coming back   no answer after 2 atte,pts  just kept getting recordings   then [facility] rings

## 2022-08-01 NOTE — Discharge Instructions (Addendum)
Discussed case with patient's family.  They stated that she is a hospice scope of therapy and would only like comfort care procedures.  They stated they called the facility and they requested the patient not be transferred to the emergency department unless suffering from life-threatening pathology.

## 2022-08-01 NOTE — ED Notes (Signed)
Ptar called pt is number 2 on the list
